# Patient Record
Sex: Female | Born: 1970
Health system: Southern US, Community
[De-identification: ages and names within clinical notes are randomized; demographics above are authoritative.]

## PROBLEM LIST (undated history)

## (undated) DIAGNOSIS — Z86718 Personal history of other venous thrombosis and embolism: Secondary | ICD-10-CM

## (undated) DIAGNOSIS — I10 Essential (primary) hypertension: Secondary | ICD-10-CM

## (undated) DIAGNOSIS — R0602 Shortness of breath: Secondary | ICD-10-CM

## (undated) DIAGNOSIS — J8410 Pulmonary fibrosis, unspecified: Secondary | ICD-10-CM

## (undated) DIAGNOSIS — R609 Edema, unspecified: Secondary | ICD-10-CM

## (undated) DIAGNOSIS — E079 Disorder of thyroid, unspecified: Secondary | ICD-10-CM

## (undated) DIAGNOSIS — C801 Malignant (primary) neoplasm, unspecified: Secondary | ICD-10-CM

## (undated) DIAGNOSIS — M549 Dorsalgia, unspecified: Secondary | ICD-10-CM

## (undated) DIAGNOSIS — M2550 Pain in unspecified joint: Secondary | ICD-10-CM

## (undated) DIAGNOSIS — I729 Aneurysm of unspecified site: Secondary | ICD-10-CM

## (undated) DIAGNOSIS — E119 Type 2 diabetes mellitus without complications: Secondary | ICD-10-CM

## (undated) DIAGNOSIS — E559 Vitamin D deficiency, unspecified: Secondary | ICD-10-CM

## (undated) HISTORY — PX: LUNG BIOPSY: SHX232

## (undated) HISTORY — DX: Vitamin D deficiency, unspecified: E55.9

## (undated) HISTORY — DX: Personal history of other venous thrombosis and embolism: Z86.718

## (undated) HISTORY — DX: Dorsalgia, unspecified: M54.9

## (undated) HISTORY — PX: KNEE SURGERY: SHX244

## (undated) HISTORY — DX: Essential (primary) hypertension: I10

## (undated) HISTORY — DX: Shortness of breath: R06.02

## (undated) HISTORY — PX: ABLATION: SHX5711

## (undated) HISTORY — DX: Edema, unspecified: R60.9

## (undated) HISTORY — DX: Pain in unspecified joint: M25.50

---

## 2000-12-06 ENCOUNTER — Other Ambulatory Visit: Admission: RE | Admit: 2000-12-06 | Discharge: 2000-12-06 | Payer: Self-pay | Admitting: Family Medicine

## 2011-09-10 DIAGNOSIS — J849 Interstitial pulmonary disease, unspecified: Secondary | ICD-10-CM | POA: Insufficient documentation

## 2011-10-01 DIAGNOSIS — R059 Cough, unspecified: Secondary | ICD-10-CM | POA: Insufficient documentation

## 2011-10-01 DIAGNOSIS — R05 Cough: Secondary | ICD-10-CM | POA: Insufficient documentation

## 2011-10-22 DIAGNOSIS — K5792 Diverticulitis of intestine, part unspecified, without perforation or abscess without bleeding: Secondary | ICD-10-CM | POA: Insufficient documentation

## 2011-10-22 DIAGNOSIS — E039 Hypothyroidism, unspecified: Secondary | ICD-10-CM | POA: Insufficient documentation

## 2011-10-22 DIAGNOSIS — Z7952 Long term (current) use of systemic steroids: Secondary | ICD-10-CM | POA: Insufficient documentation

## 2011-10-22 DIAGNOSIS — J189 Pneumonia, unspecified organism: Secondary | ICD-10-CM | POA: Insufficient documentation

## 2011-10-22 DIAGNOSIS — Z8639 Personal history of other endocrine, nutritional and metabolic disease: Secondary | ICD-10-CM | POA: Insufficient documentation

## 2011-11-12 DIAGNOSIS — J84111 Idiopathic interstitial pneumonia, not otherwise specified: Secondary | ICD-10-CM | POA: Insufficient documentation

## 2013-10-30 DIAGNOSIS — J8489 Other specified interstitial pulmonary diseases: Secondary | ICD-10-CM | POA: Insufficient documentation

## 2013-11-15 DIAGNOSIS — R0789 Other chest pain: Secondary | ICD-10-CM | POA: Insufficient documentation

## 2015-04-02 DIAGNOSIS — I10 Essential (primary) hypertension: Secondary | ICD-10-CM | POA: Insufficient documentation

## 2015-04-02 DIAGNOSIS — G4700 Insomnia, unspecified: Secondary | ICD-10-CM | POA: Insufficient documentation

## 2015-04-08 DIAGNOSIS — Z8619 Personal history of other infectious and parasitic diseases: Secondary | ICD-10-CM | POA: Insufficient documentation

## 2015-05-26 DIAGNOSIS — Z9981 Dependence on supplemental oxygen: Secondary | ICD-10-CM | POA: Insufficient documentation

## 2015-12-09 DIAGNOSIS — Z6841 Body Mass Index (BMI) 40.0 and over, adult: Secondary | ICD-10-CM

## 2016-01-06 DIAGNOSIS — I771 Stricture of artery: Secondary | ICD-10-CM | POA: Insufficient documentation

## 2016-01-06 DIAGNOSIS — I774 Celiac artery compression syndrome: Secondary | ICD-10-CM | POA: Insufficient documentation

## 2016-09-20 DIAGNOSIS — G5702 Lesion of sciatic nerve, left lower limb: Secondary | ICD-10-CM | POA: Insufficient documentation

## 2016-10-12 DIAGNOSIS — R000 Tachycardia, unspecified: Secondary | ICD-10-CM | POA: Insufficient documentation

## 2016-10-12 DIAGNOSIS — I272 Pulmonary hypertension, unspecified: Secondary | ICD-10-CM | POA: Insufficient documentation

## 2016-10-12 DIAGNOSIS — R590 Localized enlarged lymph nodes: Secondary | ICD-10-CM | POA: Insufficient documentation

## 2016-10-12 DIAGNOSIS — J479 Bronchiectasis, uncomplicated: Secondary | ICD-10-CM | POA: Insufficient documentation

## 2016-10-12 DIAGNOSIS — M25552 Pain in left hip: Secondary | ICD-10-CM | POA: Insufficient documentation

## 2016-10-12 DIAGNOSIS — I728 Aneurysm of other specified arteries: Secondary | ICD-10-CM | POA: Insufficient documentation

## 2016-12-30 DIAGNOSIS — R600 Localized edema: Secondary | ICD-10-CM | POA: Insufficient documentation

## 2017-04-18 DIAGNOSIS — J9621 Acute and chronic respiratory failure with hypoxia: Secondary | ICD-10-CM | POA: Insufficient documentation

## 2017-04-19 DIAGNOSIS — Z79899 Other long term (current) drug therapy: Secondary | ICD-10-CM | POA: Insufficient documentation

## 2017-05-17 ENCOUNTER — Telehealth (HOSPITAL_COMMUNITY): Payer: Self-pay

## 2017-05-17 NOTE — Telephone Encounter (Signed)
Patients insurance is active and benefits verified through [facility] - No co-pay, deductible amount of $7,000/$7,000 has been met, out of pocket amount of $7,900/$7,900 has been met, 40% co-insurance, and no pre-authorization is required. Reference [id number]

## 2017-05-31 ENCOUNTER — Telehealth (HOSPITAL_COMMUNITY): Payer: Self-pay

## 2017-05-31 NOTE — Telephone Encounter (Signed)
Attempted to call patient to schedule for 10:30 PR class - LMTCB

## 2017-06-02 ENCOUNTER — Telehealth (HOSPITAL_COMMUNITY): Payer: Self-pay

## 2017-06-02 NOTE — Telephone Encounter (Signed)
2nd attempt to call patient to schedule for 10:30am exc class - lm on vm. Sending letter.

## 2017-06-07 ENCOUNTER — Telehealth (HOSPITAL_COMMUNITY): Payer: Self-pay

## 2017-06-07 NOTE — Telephone Encounter (Signed)
3rd attempt to call patient to schedule for 10:30am exc class - lm on vm

## 2017-06-09 ENCOUNTER — Telehealth (HOSPITAL_COMMUNITY): Payer: Self-pay

## 2017-06-09 NOTE — Telephone Encounter (Signed)
Patient returned phone call after receiving letter in the mail. Patient stated the number we have on file was wrong. Updated demographics. Scheduled orientation for Pulmonary Rehab on 07/13/17 at 1:30pm. Patient will attend the 10:30am exc class. Mailed packet.

## 2017-07-13 ENCOUNTER — Ambulatory Visit (HOSPITAL_COMMUNITY): Payer: BLUE CROSS/BLUE SHIELD

## 2017-07-20 DIAGNOSIS — E099 Drug or chemical induced diabetes mellitus without complications: Secondary | ICD-10-CM | POA: Insufficient documentation

## 2017-08-10 ENCOUNTER — Encounter (HOSPITAL_COMMUNITY): Payer: Self-pay

## 2017-08-10 ENCOUNTER — Encounter (HOSPITAL_COMMUNITY)
Admission: RE | Admit: 2017-08-10 | Discharge: 2017-08-10 | Disposition: A | Discharge status: Home / Self Care | Payer: BLUE CROSS/BLUE SHIELD | Source: Ambulatory Visit | Attending: Pulmonary Disease | Admitting: Pulmonary Disease

## 2017-08-10 VITALS — BP 104/55 | HR 103 | Temp 98.1°F | Resp 22 | Ht 62.0 in | Wt 243.4 lb

## 2017-08-10 DIAGNOSIS — Z7901 Long term (current) use of anticoagulants: Secondary | ICD-10-CM | POA: Insufficient documentation

## 2017-08-10 DIAGNOSIS — J849 Interstitial pulmonary disease, unspecified: Secondary | ICD-10-CM | POA: Diagnosis present

## 2017-08-10 DIAGNOSIS — E119 Type 2 diabetes mellitus without complications: Secondary | ICD-10-CM | POA: Insufficient documentation

## 2017-08-10 DIAGNOSIS — Z7952 Long term (current) use of systemic steroids: Secondary | ICD-10-CM | POA: Insufficient documentation

## 2017-08-10 DIAGNOSIS — Z79899 Other long term (current) drug therapy: Secondary | ICD-10-CM | POA: Diagnosis not present

## 2017-08-10 HISTORY — DX: Disorder of thyroid, unspecified: E07.9

## 2017-08-10 HISTORY — DX: Aneurysm of unspecified site: I72.9

## 2017-08-10 HISTORY — DX: Type 2 diabetes mellitus without complications: E11.9

## 2017-08-10 NOTE — Progress Notes (Signed)
Tasha Ballard 47 y.o. female Pulmonary Rehab Orientation Note Patient arrived today in Cardiac and Pulmonary Rehab for orientation to Pulmonary Rehab. She was transported from [employer] via wheel chair accompanied by her son.  Pt has not been able to drive due to overall deconditioning. She does carry portable oxygen. Per pt, she uses oxygen continuously. Pt liter flow varies from 6-10.  Pt placed on 6 lnc since she was sitting and not exerting herself.  Pt maintain oxygen saturation of 100%.  Pt increases her liter flow to 10 LNC on exertion and wears 8 lnc at bedtime.Color good, skin warm and dry. Patient is oriented to time and place. Patient's medical history, psychosocial health, and medications reviewed. Psychosocial assessment reveals pt lives with their spouse. Pt is currently profession owns [employer]. Pt hobbies include reading. Pt reports hher stress level is high. Areas of stress/anxiety include Work.  Pt does exhibit signs of depression. Signs of depression include anxiety and panic and difficulty falling asleep, difficulty maintaining sleep and fatigue. Although pt states that that's just the way she sleeps.PHQ2/9 score 4/17. Pt shows fair  coping skills with positive outlook particularly after her doctors appt on Monday at [facility] where her numbers were moving in the right direction. Drue Dun offered emotional support and reassurance. Will continue to monitor and evaluate progress toward psychosocial goal(s) of gaining the confidence to be able to drive herself to places and not depend on others for transportation. Physical assessment reveals heart rate is normal but tachycardia, breath sounds clear to auscultation, no wheezes, rales, or rhonchi. However crackling lungs sounds are heard in the lower lobes. Grip strength equal, strong. Distal pulses palpable with pedal edema noted bilaterally.  Pt reports this is chronic for her. Patient reports she does take medications as prescribed.  Patient states she follows a presently high protein and low carbohydrate diet diet. Pt has been actively trying to lose weight so that she can get on the transplant list Pt needs to lose 60+ pounds with a goal weight on 175. Patient's weight will be monitored closely. Demonstration and practice of PLB using pulse oximeter. Patient able to return demonstration satisfactorily. Safety and hand hygiene in the exercise area reviewed with patient. Patient voices understanding of the information reviewed. Department expectations discussed with patient and achievable goals were set. The patient shows enthusiasm about attending the program and we look forward to working with this nice patient. The patient is scheduled for a 6 min walk test on 6/20 and to begin exercise on 7/2 at the 10:30 class because she needs to take her son to [facility] next week. 45 minutes was spent on a variety of activities such as assessment of the patient, obtaining baseline data including height, weight, BMI, and grip strength, verifying medical history, allergies, and current medications, and teaching patient strategies for performing tasks with less respiratory effort with emphasis on pursed lip breathing. [place], BSN Cardiac and Training and development officer

## 2017-08-11 ENCOUNTER — Encounter (HOSPITAL_COMMUNITY)
Admission: RE | Admit: 2017-08-11 | Discharge: 2017-08-11 | Disposition: A | Discharge status: Home / Self Care | Payer: BLUE CROSS/BLUE SHIELD | Source: Ambulatory Visit | Attending: Pulmonary Disease | Admitting: Pulmonary Disease

## 2017-08-11 DIAGNOSIS — J849 Interstitial pulmonary disease, unspecified: Secondary | ICD-10-CM | POA: Diagnosis not present

## 2017-08-12 NOTE — Progress Notes (Signed)
Pulmonary Individual Treatment Plan  Patient Details  Name: Tasha Ballard MRN: [id number] Date of Birth: 08/03/1970 Referring Provider:     Pulmonary Rehab Walk Test from 08/11/2017 in [place]  Referring Provider  Dr. Rayann Heman ]      Initial Encounter Date:    Pulmonary Rehab Walk Test from 08/11/2017 in [place]  Date  08/12/17  Referring Provider  Dr. Rayann Heman ]      Visit Diagnosis: ILD (interstitial lung disease) ([place])  Patient's Home Medications on Admission:   Current Outpatient Medications:  .  acyclovir (ZOVIRAX) 400 MG tablet, Take 400 mg by mouth 3 (three) times daily., Disp: , Rfl:  .  apixaban (ELIQUIS) 5 MG TABS tablet, Take 5 mg by mouth 2 (two) times daily., Disp: , Rfl:  .  benzonatate (TESSALON) 100 MG capsule, Take by mouth 3 (three) times daily as needed for cough., Disp: , Rfl:  .  carvedilol (COREG) 6.25 MG tablet, Take 6.25 mg by mouth 2 (two) times daily with a meal., Disp: , Rfl:  .  cholecalciferol (VITAMIN D) 400 units TABS tablet, Take 800 Units by mouth. Pt takes 1,000 units daily, Disp: , Rfl:  .  furosemide (LASIX) 40 MG tablet, Take 40 mg by mouth daily. 40 mg daily but on days of swelling twice a day, Disp: , Rfl:  .  metFORMIN (GLUCOPHAGE) 500 MG tablet, Take by mouth 2 (two) times daily with a meal., Disp: , Rfl:  .  montelukast (SINGULAIR) 10 MG tablet, Take 10 mg by mouth daily after supper., Disp: , Rfl:  .  mycophenolate (CELLCEPT) 500 MG tablet, Take 1,500 mg by mouth 2 (two) times daily. Takes 3 500 mg twice a day, Disp: , Rfl:  .  predniSONE (DELTASONE) 10 MG tablet, Take 10 mg by mouth daily with breakfast., Disp: , Rfl:  .  spironolactone (ALDACTONE) 100 MG tablet, Take 100 mg by mouth daily., Disp: , Rfl:  .  Sulfamethoxazole-Trimethoprim (SEPTRA DS PO), Take by mouth daily. 400-80  Once a day while on prednisone, Disp: , Rfl:  .  thyroid (ARMOUR) 30 MG tablet,  Take 60 mg by mouth daily before breakfast. Takes 60 mg tablet, Disp: , Rfl:   Past Medical History: Past Medical History:  Diagnosis Date  . Aneurysm ([place])   . Diabetes mellitus without complication ([place])   . Thyroid disease     Tobacco Use: Social History   Tobacco Use  Smoking Status Never Smoker  Smokeless Tobacco Never Used    Labs: Recent Review Flowsheet Data    There is no flowsheet data to display.      Capillary Blood Glucose: No results found for: GLUCAP   Pulmonary Assessment Scores:   Pulmonary Function Assessment:   Exercise Target Goals: Date: 08/12/17  Exercise Program Goal: Individual exercise prescription set using results from initial 6 min walk test and THRR while considering  patient's activity barriers and safety.    Exercise Prescription Goal: Initial exercise prescription builds to 30-45 minutes a day of aerobic activity, 2-3 days per week.  Home exercise guidelines will be given to patient during program as part of exercise prescription that the participant will acknowledge.  Activity Barriers & Risk Stratification: Activity Barriers & Cardiac Risk Stratification - 08/10/17 1442      Activity Barriers & Cardiac Risk Stratification   Activity Barriers  Deconditioning;Shortness of Breath       6 Minute Walk:  [place] Name 08/12/17 1047         6 Minute Walk   Phase  Initial     Distance  800 feet     Walk Time  - 5 minutes 40 seconds     # of Rest Breaks  1 1 rest break initiated by EP due to low oxygen saturation     MPH  1.5     METS  2.15     RPE  13     Perceived Dyspnea   2     Symptoms  No     Resting HR  114 bpm     Resting BP  148/81     Resting Oxygen Saturation   97 %     Exercise Oxygen Saturation  during 6 min walk  80 %     Max Ex. HR  146 bpm     Max Ex. BP  136/81       Interval HR   1 Minute HR  118     2 Minute HR  129     3 Minute HR  135     4 Minute HR  135     5 Minute HR  146      6 Minute HR  145     Interval Heart Rate?  Yes       Interval Oxygen   Interval Oxygen?  Yes     Baseline Oxygen Saturation %  97 %     1 Minute Oxygen Saturation %  92 %     1 Minute Liters of Oxygen  8 L     2 Minute Oxygen Saturation %  80 %     2 Minute Liters of Oxygen  8 L     3 Minute Oxygen Saturation %  88 %     3 Minute Liters of Oxygen  10 L     4 Minute Oxygen Saturation %  86 %     4 Minute Liters of Oxygen  10 L     5 Minute Oxygen Saturation %  91 %     5 Minute Liters of Oxygen  15 L     6 Minute Oxygen Saturation %  91 %     6 Minute Liters of Oxygen  15 L        Oxygen Initial Assessment: Oxygen Initial Assessment - 08/12/17 1046      Initial 6 min Walk   Oxygen Used  Continuous;E-Tanks    Liters per minute  15 titrated from 8-15      Program Oxygen Prescription   Program Oxygen Prescription  Continuous;E-Tanks    Liters per minute  15       Oxygen Re-Evaluation:   Oxygen Discharge (Final Oxygen Re-Evaluation):   Initial Exercise Prescription: Initial Exercise Prescription - 08/12/17 1000      Date of Initial Exercise RX and Referring Provider   Date  08/12/17    Referring Provider  Dr. Rayann Heman       Oxygen   Oxygen  Continuous    Liters  15      NuStep   Level  2    SPM  80    Minutes  17    METs  1.5      Arm Ergometer   Level  2    Watts  10    Minutes  17      Track  Laps  8    Minutes  17      Prescription Details   Frequency (times per week)  2    Duration  Progress to 45 minutes of aerobic exercise without signs/symptoms of physical distress      Intensity   THRR 40-80% of Max Heartrate  69-138    Ratings of Perceived Exertion  11-13    Perceived Dyspnea  0-4      Progression   Progression  Continue progressive overload as per policy without signs/symptoms or physical distress.      Resistance Training   Training Prescription  Yes    Weight  blue bands    Reps  10-15       Perform Capillary Blood  Glucose checks as needed.  Exercise Prescription Changes:   Exercise Comments:   Exercise Goals and Review: Exercise Goals    Row Name 08/10/17 1444             Exercise Goals   Increase Physical Activity  Yes       Intervention  Provide advice, education, support and counseling about physical activity/exercise needs.;Develop an individualized exercise prescription for aerobic and resistive training based on initial evaluation findings, risk stratification, comorbidities and participant's personal goals.       Expected Outcomes  Short Term: Attend rehab on a regular basis to increase amount of physical activity.;Long Term: Add in home exercise to make exercise part of routine and to increase amount of physical activity.;Long Term: Exercising regularly at least 3-5 days a week.       Increase Strength and Stamina  Yes       Intervention  Provide advice, education, support and counseling about physical activity/exercise needs.;Develop an individualized exercise prescription for aerobic and resistive training based on initial evaluation findings, risk stratification, comorbidities and participant's personal goals.       Expected Outcomes  Short Term: Increase workloads from initial exercise prescription for resistance, speed, and METs.;Short Term: Perform resistance training exercises routinely during rehab and add in resistance training at home;Long Term: Improve cardiorespiratory fitness, muscular endurance and strength as measured by increased METs and functional capacity (6MWT)       Able to understand and use rate of perceived exertion (RPE) scale  Yes       Intervention  Provide education and explanation on how to use RPE scale       Expected Outcomes  Short Term: Able to use RPE daily in rehab to express subjective intensity level;Long Term:  Able to use RPE to guide intensity level when exercising independently       Able to understand and use Dyspnea scale  Yes       Intervention   Provide education and explanation on how to use Dyspnea scale       Expected Outcomes  Short Term: Able to use Dyspnea scale daily in rehab to express subjective sense of shortness of breath during exertion;Long Term: Able to use Dyspnea scale to guide intensity level when exercising independently       Knowledge and understanding of Target Heart Rate Range (THRR)  Yes       Intervention  Provide education and explanation of THRR including how the numbers were predicted and where they are located for reference       Expected Outcomes  Short Term: Able to state/look up THRR;Long Term: Able to use THRR to govern intensity when exercising independently;Short Term: Able to use daily as guideline for intensity  in rehab       Understanding of Exercise Prescription  Yes       Intervention  Provide education, explanation, and written materials on patient's individual exercise prescription       Expected Outcomes  Short Term: Able to explain program exercise prescription;Long Term: Able to explain home exercise prescription to exercise independently          Exercise Goals Re-Evaluation :   Discharge Exercise Prescription (Final Exercise Prescription Changes):   Nutrition:  Target Goals: Understanding of nutrition guidelines, daily intake of sodium <1562m, cholesterol <2026m calories 30% from fat and 7% or less from saturated fats, daily to have 5 or more servings of fruits and vegetables.  Biometrics:    Nutrition Therapy Plan and Nutrition Goals:   Nutrition Assessments:   Nutrition Goals Re-Evaluation:   Nutrition Goals Discharge (Final Nutrition Goals Re-Evaluation):   Psychosocial: Target Goals: Acknowledge presence or absence of significant depression and/or stress, maximize coping skills, provide positive support system. Participant is able to verbalize types and ability to use techniques and skills needed for reducing stress and depression.  Initial Review & Psychosocial  Screening: Initial Psych Review & Screening - 08/10/17 1439      Initial Review   Current issues with  Current Stress Concerns    Source of Stress Concerns  Transportation;Occupation      Family Dynamics   [place]      Barriers   Psychosocial barriers to participate in program  The patient should benefit from training in stress management and relaxation.      Screening Interventions   Interventions  Encouraged to exercise    Expected Outcomes  Long Term goal: The participant improves quality of Life and PHQ9 Scores as seen by post scores and/or verbalization of changes;Long Term Goal: Stressors or current issues are controlled or eliminated.       Quality of Life Scores:  Scores of 19 and below usually indicate a poorer quality of life in these areas.  A difference of  2-3 points is a clinically meaningful difference.  A difference of 2-3 points in the total score of the Quality of Life Index has been associated with significant improvement in overall quality of life, self-image, physical symptoms, and general health in studies assessing change in quality of life.   PHQ-9: Recent Review Flowsheet Data    [facility]   Decreased Interest 3   Down, Depressed, Hopeless 1   PHQ - 2 Score 4   Altered sleeping 3   Tired, decreased energy 3   Change in appetite 3   Feeling bad or failure about yourself  3   Trouble concentrating 1   Moving slowly or fidgety/restless 0   Suicidal thoughts 0   PHQ-9 Score 17   Difficult doing work/chores Very difficult     Interpretation of Total Score  Total Score Depression Severity:  1-4 = Minimal depression, 5-9 = Mild depression, 10-14 = Moderate depression, 15-19 = Moderately severe depression, 20-27 = Severe depression   Psychosocial Evaluation and Intervention: Psychosocial Evaluation - 08/10/17 1440      Psychosocial Evaluation & Interventions   Interventions  Encouraged to exercise with the  program and follow exercise prescription;Relaxation education;Stress management education    Expected Outcomes  Continue to display hopeful and positive outlook on life    Continue Psychosocial Services   Follow up required by staff       Psychosocial Re-Evaluation:  Psychosocial Discharge (Final Psychosocial Re-Evaluation):   Education: Education Goals: Education classes will be provided on a weekly basis, covering required topics. Participant will state understanding/return demonstration of topics presented.  Learning Barriers/Preferences: Learning Barriers/Preferences - 08/10/17 1441      Learning Barriers/Preferences   Learning Barriers  Sight    Learning Preferences  Computer/Internet;Group Instruction;Individual Instruction;Written Material       Education Topics: Risk Factor Reduction:  -Group instruction that is supported by a PowerPoint presentation. Instructor discusses the definition of a risk factor, different risk factors for pulmonary disease, and how the heart and lungs work together.     Nutrition for Pulmonary Patient:  -Group instruction provided by PowerPoint slides, verbal discussion, and written materials to support subject matter. The instructor gives an explanation and review of healthy diet recommendations, which includes a discussion on weight management, recommendations for fruit and vegetable consumption, as well as protein, fluid, caffeine, fiber, sodium, sugar, and alcohol. Tips for eating when patients are short of breath are discussed.   Pursed Lip Breathing:  -Group instruction that is supported by demonstration and informational handouts. Instructor discusses the benefits of pursed lip and diaphragmatic breathing and detailed demonstration on how to preform both.     Oxygen Safety:  -Group instruction provided by PowerPoint, verbal discussion, and written material to support subject matter. There is an overview of "What is Oxygen" and "Why do we  need it".  Instructor also reviews how to create a safe environment for oxygen use, the importance of using oxygen as prescribed, and the risks of noncompliance. There is a brief discussion on traveling with oxygen and resources the patient may utilize.   Oxygen Equipment:  -Group instruction provided by [facility] Staff utilizing handouts, written materials, and equipment demonstrations.   Signs and Symptoms:  -Group instruction provided by written material and verbal discussion to support subject matter. Warning signs and symptoms of infection, stroke, and heart attack are reviewed and when to call the physician/911 reinforced. Tips for preventing the spread of infection discussed.   Advanced Directives:  -Group instruction provided by verbal instruction and written material to support subject matter. Instructor reviews Advanced Directive laws and proper instruction for filling out document.   Pulmonary Video:  -Group video education that reviews the importance of medication and oxygen compliance, exercise, good nutrition, pulmonary hygiene, and pursed lip and diaphragmatic breathing for the pulmonary patient.   Exercise for the Pulmonary Patient:  -Group instruction that is supported by a PowerPoint presentation. Instructor discusses benefits of exercise, core components of exercise, frequency, duration, and intensity of an exercise routine, importance of utilizing pulse oximetry during exercise, safety while exercising, and options of places to exercise outside of rehab.     Pulmonary Medications:  -Verbally interactive group education provided by instructor with focus on inhaled medications and proper administration.   Anatomy and Physiology of the Respiratory System and Intimacy:  -Group instruction provided by PowerPoint, verbal discussion, and written material to support subject matter. Instructor reviews respiratory cycle and anatomical components of the respiratory system and  their functions. Instructor also reviews differences in obstructive and restrictive respiratory diseases with examples of each. Intimacy, Sex, and Sexuality differences are reviewed with a discussion on how relationships can change when diagnosed with pulmonary disease. Common sexual concerns are reviewed.   MD DAY -A group question and answer session with a medical doctor that allows participants to ask questions that relate to their pulmonary disease state.   OTHER EDUCATION -Group or individual  verbal, written, or video instructions that support the educational goals of the pulmonary rehab program.   Holiday Eating Survival Tips:  -Group instruction provided by PowerPoint slides, verbal discussion, and written materials to support subject matter. The instructor gives patients tips, tricks, and techniques to help them not only survive but enjoy the holidays despite the onslaught of food that accompanies the holidays.   Knowledge Questionnaire Score:   Core Components/Risk Factors/Patient Goals at Admission: Personal Goals and Risk Factors at Admission - 08/10/17 1433      Core Components/Risk Factors/Patient Goals on Admission    Weight Management  Obesity;Weight Loss    Intervention  Weight Management: Develop a combined nutrition and exercise program designed to reach desired caloric intake, while maintaining appropriate intake of nutrient and fiber, sodium and fats, and appropriate energy expenditure required for the weight goal.;Weight Management: Provide education and appropriate resources to help participant work on and attain dietary goals.;Weight Management/Obesity: Establish reasonable short term and long term weight goals.    Admit Weight  243 lb 6.2 oz (110.4 kg)    Goal Weight: Short Term  200 lb (90.7 kg)    Goal Weight: Long Term  175 lb (79.4 kg)    Expected Outcomes  Short Term: Continue to assess and modify interventions until short term weight is achieved;Long Term:  Adherence to nutrition and physical activity/exercise program aimed toward attainment of established weight goal;Weight Loss: Understanding of general recommendations for a balanced deficit meal plan, which promotes 1-2 lb weight loss per week and includes a negative energy balance of [phone] kcal/d;Understanding recommendations for meals to include 15-35% energy as protein, 25-35% energy from fat, 35-60% energy from carbohydrates, less than 21m of dietary cholesterol, 20-35 gm of total fiber daily;Understanding of distribution of calorie intake throughout the day with the consumption of 4-5 meals/snacks    Improve shortness of breath with ADL's  Yes    Intervention  Provide education, individualized exercise plan and daily activity instruction to help decrease symptoms of SOB with activities of daily living.    Expected Outcomes  Short Term: Improve cardiorespiratory fitness to achieve a reduction of symptoms when performing ADLs;Long Term: Be able to perform more ADLs without symptoms or delay the onset of symptoms    Diabetes  Yes    Intervention  Provide education about proper nutrition, including hydration, and aerobic/resistive exercise prescription along with prescribed medications to achieve blood glucose in normal ranges: Fasting glucose 65-99 mg/dL;Provide education about signs/symptoms and action to take for hypo/hyperglycemia.    Expected Outcomes  Short Term: Participant verbalizes understanding of the signs/symptoms and immediate care of hyper/hypoglycemia, proper foot care and importance of medication, aerobic/resistive exercise and nutrition plan for blood glucose control.;Long Term: Attainment of HbA1C < 7%.    Stress  Yes    Intervention  Offer individual and/or small group education and counseling on adjustment to heart disease, stress management and health-related lifestyle change. Teach and support self-help strategies.;Refer participants experiencing significant psychosocial distress  to appropriate mental health specialists for further evaluation and treatment. When possible, include family members and significant others in education/counseling sessions.    Expected Outcomes  Short Term: Participant demonstrates changes in health-related behavior, relaxation and other stress management skills, ability to obtain effective social support, and compliance with psychotropic medications if prescribed.;Long Term: Emotional wellbeing is indicated by absence of clinically significant psychosocial distress or social isolation.       Core Components/Risk Factors/Patient Goals Review:    Core Components/Risk Factors/Patient Goals  at Discharge (Final Review):    ITP Comments: ITP Comments    Row Name 08/10/17 1359           ITP Comments  Dr. Jennet Maduro, Medical Director          Comments:

## 2017-08-23 ENCOUNTER — Encounter (HOSPITAL_COMMUNITY)
Admission: RE | Admit: 2017-08-23 | Discharge: 2017-08-23 | Disposition: A | Discharge status: Home / Self Care | Payer: BLUE CROSS/BLUE SHIELD | Source: Ambulatory Visit | Attending: Pulmonary Disease | Admitting: Pulmonary Disease

## 2017-08-23 DIAGNOSIS — Z7901 Long term (current) use of anticoagulants: Secondary | ICD-10-CM | POA: Insufficient documentation

## 2017-08-23 DIAGNOSIS — Z79899 Other long term (current) drug therapy: Secondary | ICD-10-CM | POA: Insufficient documentation

## 2017-08-23 DIAGNOSIS — Z7952 Long term (current) use of systemic steroids: Secondary | ICD-10-CM | POA: Insufficient documentation

## 2017-08-23 DIAGNOSIS — E119 Type 2 diabetes mellitus without complications: Secondary | ICD-10-CM | POA: Insufficient documentation

## 2017-08-23 DIAGNOSIS — J849 Interstitial pulmonary disease, unspecified: Secondary | ICD-10-CM | POA: Insufficient documentation

## 2017-08-23 NOTE — Progress Notes (Signed)
Incomplete Session Note  Patient Details  Name: Tasha Ballard MRN: [id number] Date of Birth: [birth year] Referring Provider:     Pulmonary Rehab Walk Test from 08/11/2017 in [place]  Referring Provider  Dr. Rayann Heman ]      Tasha Ballard did not complete her rehab session.  Pt arrived for exercise and complained of having nausea and vomiting for the last week while [place] was at the beach with [place]. Pt symptoms started with vomiting and nausea.  Pt feels like it could be a medication side effect.  However pt has not started any new medications.  Pt unable to keep any solids down today. Pt had some soup yesterday and feels weak.  Pt advised not to exercise today and reiterated our sick rule of symptom free for 48 hours.  Pt and mom verbalized understanding.  Pt will return on next Tuesday due to department closure for the holiday. Cherre Huger, BSN Cardiac and Training and development officer

## 2017-08-23 NOTE — Progress Notes (Signed)
Incomplete Session Note  Patient Details  Name: Tasha Ballard MRN: [id number] Date of Birth: Oct 28, 1970 Referring Provider:     Pulmonary Rehab Walk Test from 08/11/2017 in [place]  Referring Provider  Dr. Rayann Heman ]      DEWANNA HURSTON did not complete her rehab session.  She presented to class with vomiting in the last 24 hours and was sent home without exercising.

## 2017-08-30 ENCOUNTER — Encounter (HOSPITAL_COMMUNITY)
Admission: RE | Admit: 2017-08-30 | Discharge: 2017-08-30 | Disposition: A | Discharge status: Home / Self Care | Payer: BLUE CROSS/BLUE SHIELD | Source: Ambulatory Visit | Attending: Pulmonary Disease | Admitting: Pulmonary Disease

## 2017-08-30 VITALS — Wt 236.3 lb

## 2017-08-30 DIAGNOSIS — J849 Interstitial pulmonary disease, unspecified: Secondary | ICD-10-CM | POA: Diagnosis not present

## 2017-08-30 DIAGNOSIS — Z7952 Long term (current) use of systemic steroids: Secondary | ICD-10-CM | POA: Diagnosis not present

## 2017-08-30 DIAGNOSIS — Z7901 Long term (current) use of anticoagulants: Secondary | ICD-10-CM | POA: Diagnosis not present

## 2017-08-30 DIAGNOSIS — E119 Type 2 diabetes mellitus without complications: Secondary | ICD-10-CM | POA: Diagnosis not present

## 2017-08-30 DIAGNOSIS — Z79899 Other long term (current) drug therapy: Secondary | ICD-10-CM | POA: Diagnosis not present

## 2017-08-30 NOTE — Progress Notes (Signed)
Daily Session Note  Patient Details  Name: Tasha Ballard MRN: [id number] Date of Birth: [birth year] Referring Provider:     Pulmonary Rehab Walk Test from 08/11/2017 in [place]  Referring Provider  Dr. Rayann Heman ]      Encounter Date: 08/30/2017  Check In: Session Check In - 08/30/17 1030      Check-In   Location  [facility]    Staff Present  Rosebud Poles, RN, BSN;Carlette Wilber Oliphant, RN, BSN;Lisa Ysidro Evert, Felipe Drone, RN, [facility]    Supervising physician immediately available to respond to emergencies  [facility] immediately available    Physician(s)  Dr. Verlon Au    Medication changes reported      No    Fall or balance concerns reported     No    Tobacco Cessation  No Change    Warm-up and Cool-down  Performed as group-led instruction    Resistance Training Performed  Yes    VAD Patient?  No    PAD/SET Patient?  No      Pain Assessment   Currently in Pain?  No/denies    Multiple Pain Sites  No       Capillary Blood Glucose: No results found for this or any previous visit (from the past 24 hour(s)). POCT Glucose - 08/30/17 1246      POCT Blood Glucose   Pre-Exercise  137 mg/dL      Exercise Prescription Changes - 08/30/17 1200      Response to Exercise   Blood Pressure (Admit)  118/70    Blood Pressure (Exercise)  124/70    Blood Pressure (Exit)  118/76    Heart Rate (Admit)  102 bpm    Heart Rate (Exercise)  118 bpm    Heart Rate (Exit)  108 bpm    Oxygen Saturation (Admit)  98 %    Oxygen Saturation (Exercise)  95 %    Oxygen Saturation (Exit)  99 %    Rating of Perceived Exertion (Exercise)  13    Perceived Dyspnea (Exercise)  1    Duration  Progress to 45 minutes of aerobic exercise without signs/symptoms of physical distress    Intensity  -- 40-80% HRR      Progression   Progression  Continue to progress workloads to maintain intensity without signs/symptoms of physical distress.      Resistance Training   Training Prescription  Yes    Weight  blue bands    Reps  10-15    Time  10 Minutes      Interval Training   Interval Training  No      Oxygen   Oxygen  Continuous    Liters  15      NuStep   Level  2    SPM  80    Minutes  17      Arm Ergometer   Level  2    Watts  10    Minutes  17      Track   Laps  8    Minutes  17       Social History   Tobacco Use  Smoking Status Never Smoker  Smokeless Tobacco Never Used    Goals Met:  Exercise tolerated well Strength training completed today  Goals Unmet:  Not Applicable  Comments: Service time is from1030  to [place]    Dr. Rush Farmer is Medical Director for Pulmonary Rehab at [facility].

## 2017-09-01 ENCOUNTER — Encounter (HOSPITAL_COMMUNITY)
Admission: RE | Admit: 2017-09-01 | Discharge: 2017-09-01 | Disposition: A | Discharge status: Home / Self Care | Payer: BLUE CROSS/BLUE SHIELD | Source: Ambulatory Visit | Attending: Pulmonary Disease | Admitting: Pulmonary Disease

## 2017-09-01 VITALS — Wt 237.7 lb

## 2017-09-01 DIAGNOSIS — J849 Interstitial pulmonary disease, unspecified: Secondary | ICD-10-CM

## 2017-09-01 LAB — GLUCOSE, CAPILLARY: Glucose-Capillary: 162 mg/dL — ABNORMAL HIGH (ref 70–99)

## 2017-09-01 NOTE — Progress Notes (Signed)
Daily Session Note  Patient Details  Name: ADISYN RUSCITTI MRN: [id number] Date of Birth: [birth year] Referring Provider:     Pulmonary Rehab Walk Test from 08/11/2017 in [place]  Referring Provider  Dr. Rayann Heman ]      Encounter Date: 09/01/2017  Check In: Session Check In - 09/01/17 1030      Check-In   Location  [facility]    Staff Present  Rosebud Poles, RN, BSN;Carlette Wilber Oliphant, RN, BSN;Lisa Ysidro Evert, Felipe Drone, RN, [facility]    Supervising physician immediately available to respond to emergencies  [facility] immediately available    Physician(s)  Dr. Broadus John    Medication changes reported      No    Fall or balance concerns reported     No    Tobacco Cessation  No Change    Warm-up and Cool-down  Performed as group-led instruction    Resistance Training Performed  Yes    VAD Patient?  No    PAD/SET Patient?  No      Pain Assessment   Currently in Pain?  No/denies    Multiple Pain Sites  No       Capillary Blood Glucose: Results for orders placed or performed during the hospital encounter of 09/01/17 (from the past 24 hour(s))  Glucose, capillary     Status: Abnormal   Collection Time: 09/01/17 12:24 PM  Result Value Ref Range   Glucose-Capillary 162 (H) 70 - 99 mg/dL      Social History   Tobacco Use  Smoking Status Never Smoker  Smokeless Tobacco Never Used    Goals Met:  Exercise tolerated well Strength training completed today  Goals Unmet:  Not Applicable  Comments: Service time is from 1030 to 1225    Dr. Rush Farmer is Medical Director for Pulmonary Rehab at [facility].

## 2017-09-06 ENCOUNTER — Encounter (HOSPITAL_COMMUNITY)
Admission: RE | Admit: 2017-09-06 | Discharge: 2017-09-06 | Disposition: A | Discharge status: Home / Self Care | Payer: BLUE CROSS/BLUE SHIELD | Source: Ambulatory Visit | Attending: Pulmonary Disease | Admitting: Pulmonary Disease

## 2017-09-06 VITALS — Wt 239.4 lb

## 2017-09-06 DIAGNOSIS — J849 Interstitial pulmonary disease, unspecified: Secondary | ICD-10-CM

## 2017-09-06 NOTE — Progress Notes (Signed)
Daily Session Note  Patient Details  Name: Tasha Ballard MRN: [id number] Date of Birth: 1970/08/11 Referring Provider:     Pulmonary Rehab Walk Test from 08/11/2017 in [place]  Referring Provider  Dr. Rayann Heman ]      Encounter Date: 09/06/2017  Check In: Session Check In - 09/06/17 1030      Check-In   Location  [facility]    Staff Present  Rosebud Poles, RN, BSN;Carlette Carlton, RN, [facility], MS, ACSM RCEP, Exercise Physiologist;Lisa Ysidro Evert, Felipe Drone, RN, [facility]    Supervising physician immediately available to respond to emergencies  [facility] immediately available    Physician(s)  Dr. Broadus John    Medication changes reported      No    Fall or balance concerns reported     No    Tobacco Cessation  No Change    Warm-up and Cool-down  Performed as group-led instruction    Resistance Training Performed  Yes    VAD Patient?  No    PAD/SET Patient?  No      Pain Assessment   Currently in Pain?  No/denies    Multiple Pain Sites  No       Capillary Blood Glucose: No results found for this or any previous visit (from the past 24 hour(s)).    Social History   Tobacco Use  Smoking Status Never Smoker  Smokeless Tobacco Never Used    Goals Met:  Exercise tolerated well Strength training completed today  Goals Unmet:  Not Applicable  Comments: Service time is from 1030 to 1250    Dr. Rush Farmer is Medical Director for Pulmonary Rehab at [facility].

## 2017-09-08 ENCOUNTER — Encounter (HOSPITAL_COMMUNITY)
Admission: RE | Admit: 2017-09-08 | Discharge: 2017-09-08 | Disposition: A | Discharge status: Home / Self Care | Payer: BLUE CROSS/BLUE SHIELD | Source: Ambulatory Visit | Attending: Pulmonary Disease | Admitting: Pulmonary Disease

## 2017-09-08 VITALS — Wt 239.6 lb

## 2017-09-08 DIAGNOSIS — J849 Interstitial pulmonary disease, unspecified: Secondary | ICD-10-CM

## 2017-09-08 NOTE — Progress Notes (Signed)
Daily Session Note  Patient Details  Name: Tasha Ballard MRN: [id number] Date of Birth: 03/19/1970 Referring Provider:     Pulmonary Rehab Walk Test from 08/11/2017 in [place]  Referring Provider  Dr. Rayann Heman ]      Encounter Date: 09/08/2017  Check In: Session Check In - 09/08/17 1030      Check-In   Location  [facility]    Staff Present  Rosebud Poles, RN, BSN;Carlette Carlton, RN, [facility], MS, ACSM RCEP, Exercise Physiologist;Lisa Ysidro Evert, Felipe Drone, RN, [facility]    Supervising physician immediately available to respond to emergencies  [facility] immediately available    Physician(s)  Dr. Denton Brick    Medication changes reported      No    Fall or balance concerns reported     No    Tobacco Cessation  No Change    Warm-up and Cool-down  Performed as group-led instruction    Resistance Training Performed  Yes    VAD Patient?  No    PAD/SET Patient?  No      Pain Assessment   Currently in Pain?  No/denies    Multiple Pain Sites  No       Capillary Blood Glucose: No results found for this or any previous visit (from the past 24 hour(s)).    Social History   Tobacco Use  Smoking Status Never Smoker  Smokeless Tobacco Never Used    Goals Met:  Exercise tolerated well Strength training completed today  Goals Unmet:  Not Applicable  Comments: Service time is from 1030 to 1210.    Dr. Rush Farmer is Medical Director for Pulmonary Rehab at [facility].

## 2017-09-12 NOTE — Progress Notes (Signed)
Pulmonary Individual Treatment Plan  Patient Details  Name: Tasha Ballard MRN: [id number] Date of Birth: August 26, 1970 Referring Provider:     Pulmonary Rehab Walk Test from 08/11/2017 in [place]  Referring Provider  Dr. Rayann Heman ]      Initial Encounter Date:    Pulmonary Rehab Walk Test from 08/11/2017 in [place]  Date  08/12/17      Visit Diagnosis: ILD (interstitial lung disease) ([place])  Patient's Home Medications on Admission:   Current Outpatient Medications:  .  acyclovir (ZOVIRAX) 400 MG tablet, Take 400 mg by mouth 3 (three) times daily., Disp: , Rfl:  .  apixaban (ELIQUIS) 5 MG TABS tablet, Take 5 mg by mouth 2 (two) times daily., Disp: , Rfl:  .  benzonatate (TESSALON) 100 MG capsule, Take by mouth 3 (three) times daily as needed for cough., Disp: , Rfl:  .  carvedilol (COREG) 6.25 MG tablet, Take 6.25 mg by mouth 2 (two) times daily with a meal., Disp: , Rfl:  .  cholecalciferol (VITAMIN D) 400 units TABS tablet, Take 800 Units by mouth. Pt takes 1,000 units daily, Disp: , Rfl:  .  furosemide (LASIX) 40 MG tablet, Take 40 mg by mouth daily. 40 mg daily but on days of swelling twice a day, Disp: , Rfl:  .  metFORMIN (GLUCOPHAGE) 500 MG tablet, Take by mouth 2 (two) times daily with a meal., Disp: , Rfl:  .  montelukast (SINGULAIR) 10 MG tablet, Take 10 mg by mouth daily after supper., Disp: , Rfl:  .  mycophenolate (CELLCEPT) 500 MG tablet, Take 1,500 mg by mouth 2 (two) times daily. Takes 3 500 mg twice a day, Disp: , Rfl:  .  predniSONE (DELTASONE) 10 MG tablet, Take 10 mg by mouth daily with breakfast., Disp: , Rfl:  .  spironolactone (ALDACTONE) 100 MG tablet, Take 100 mg by mouth daily., Disp: , Rfl:  .  Sulfamethoxazole-Trimethoprim (SEPTRA DS PO), Take by mouth daily. 400-80  Once a day while on prednisone, Disp: , Rfl:  .  thyroid (ARMOUR) 30 MG tablet, Take 60 mg by mouth daily before  breakfast. Takes 60 mg tablet, Disp: , Rfl:   Past Medical History: Past Medical History:  Diagnosis Date  . Aneurysm ([place])   . Diabetes mellitus without complication ([place])   . Thyroid disease     Tobacco Use: Social History   Tobacco Use  Smoking Status Never Smoker  Smokeless Tobacco Never Used    Labs: Recent Review Flowsheet Data    There is no flowsheet data to display.      Capillary Blood Glucose: Lab Results  Component Value Date   GLUCAP 162 (H) 09/01/2017   POCT Glucose    Row Name 08/30/17 1246 09/13/17 1255           POCT Blood Glucose   Pre-Exercise  137 mg/dL  191 mg/dL      Post-Exercise  -  219 mg/dL         Pulmonary Assessment Scores: Pulmonary Assessment Scores    Row Name 08/12/17 1055         ADL UCSD   ADL Phase  Entry       mMRC Score   mMRC Score  3        Pulmonary Function Assessment:   Exercise Target Goals:    Exercise Program Goal: Individual exercise prescription set using results from initial 6 min walk test  and THRR while considering  patient's activity barriers and safety.    Exercise Prescription Goal: Initial exercise prescription builds to 30-45 minutes a day of aerobic activity, 2-3 days per week.  Home exercise guidelines will be given to patient during program as part of exercise prescription that the participant will acknowledge.  Activity Barriers & Risk Stratification: Activity Barriers & Cardiac Risk Stratification - 08/10/17 1442      Activity Barriers & Cardiac Risk Stratification   Activity Barriers  Deconditioning;Shortness of Breath       6 Minute Walk: 6 Minute Walk    Row Name 08/12/17 1047         6 Minute Walk   Phase  Initial     Distance  800 feet     Walk Time  - 5 minutes 40 seconds     # of Rest Breaks  1 1 rest break initiated by EP due to low oxygen saturation     MPH  1.5     METS  2.15     RPE  13     Perceived Dyspnea   2     Symptoms  No     Resting HR  114 bpm      Resting BP  148/81     Resting Oxygen Saturation   97 %     Exercise Oxygen Saturation  during 6 min walk  80 %     Max Ex. HR  146 bpm     Max Ex. BP  136/81       Interval HR   1 Minute HR  118     2 Minute HR  129     3 Minute HR  135     4 Minute HR  135     5 Minute HR  146     6 Minute HR  145     Interval Heart Rate?  Yes       Interval Oxygen   Interval Oxygen?  Yes     Baseline Oxygen Saturation %  97 %     1 Minute Oxygen Saturation %  92 %     1 Minute Liters of Oxygen  8 L     2 Minute Oxygen Saturation %  80 %     2 Minute Liters of Oxygen  8 L     3 Minute Oxygen Saturation %  88 %     3 Minute Liters of Oxygen  10 L     4 Minute Oxygen Saturation %  86 %     4 Minute Liters of Oxygen  10 L     5 Minute Oxygen Saturation %  91 %     5 Minute Liters of Oxygen  15 L     6 Minute Oxygen Saturation %  91 %     6 Minute Liters of Oxygen  15 L        Oxygen Initial Assessment: Oxygen Initial Assessment - 08/12/17 1046      Initial 6 min Walk   Oxygen Used  Continuous;E-Tanks    Liters per minute  15 titrated from 8-15      Program Oxygen Prescription   Program Oxygen Prescription  Continuous;E-Tanks    Liters per minute  15       Oxygen Re-Evaluation: Oxygen Re-Evaluation    Row Name 08/19/17 1045 09/12/17 0912           Program Oxygen Prescription  Program Oxygen Prescription  Continuous;E-Tanks  Continuous;E-Tanks      Liters per minute  15  15      Comments  Pt wears 6 liters at rest (nasal cannula) and 10 liters on exertion  Pt wears 6 liters at rest (nasal cannula) and 10 liters on exertion        Home Oxygen   Home Oxygen Device  Portable Concentrator;Home Concentrator  Portable Concentrator;Home Concentrator      Sleep Oxygen Prescription  Continuous  Continuous      Liters per minute  6  6      Home Exercise Oxygen Prescription  Continuous  Continuous      Liters per minute  10  10      Home at Rest Exercise Oxygen Prescription   Continuous  Continuous      Liters per minute  10  10      Compliance with Home Oxygen Use  Yes  Yes        Goals/Expected Outcomes   Short Term Goals  To learn and exhibit compliance with exercise, home and travel O2 prescription;To learn and understand importance of monitoring SPO2 with pulse oximeter and demonstrate accurate use of the pulse oximeter.;To learn and understand importance of maintaining oxygen saturations>88%;To learn and demonstrate proper pursed lip breathing techniques or other breathing techniques.;To learn and demonstrate proper use of respiratory medications  To learn and exhibit compliance with exercise, home and travel O2 prescription;To learn and understand importance of monitoring SPO2 with pulse oximeter and demonstrate accurate use of the pulse oximeter.;To learn and understand importance of maintaining oxygen saturations>88%;To learn and demonstrate proper pursed lip breathing techniques or other breathing techniques.;To learn and demonstrate proper use of respiratory medications      Long  Term Goals  Exhibits compliance with exercise, home and travel O2 prescription;Verbalizes importance of monitoring SPO2 with pulse oximeter and return demonstration;Maintenance of O2 saturations>88%;Exhibits proper breathing techniques, such as pursed lip breathing or other method taught during program session;Compliance with respiratory medication;Demonstrates proper use of MDI's  Exhibits compliance with exercise, home and travel O2 prescription;Verbalizes importance of monitoring SPO2 with pulse oximeter and return demonstration;Maintenance of O2 saturations>88%;Exhibits proper breathing techniques, such as pursed lip breathing or other method taught during program session;Compliance with respiratory medication;Demonstrates proper use of MDI's         Oxygen Discharge (Final Oxygen Re-Evaluation): Oxygen Re-Evaluation - 09/12/17 0912      Program Oxygen Prescription   Program  Oxygen Prescription  Continuous;E-Tanks    Liters per minute  15    Comments  Pt wears 6 liters at rest (nasal cannula) and 10 liters on exertion      Home Oxygen   Home Oxygen Device  Portable Concentrator;Home Concentrator    Sleep Oxygen Prescription  Continuous    Liters per minute  6    Home Exercise Oxygen Prescription  Continuous    Liters per minute  10    Home at Rest Exercise Oxygen Prescription  Continuous    Liters per minute  10    Compliance with Home Oxygen Use  Yes      Goals/Expected Outcomes   Short Term Goals  To learn and exhibit compliance with exercise, home and travel O2 prescription;To learn and understand importance of monitoring SPO2 with pulse oximeter and demonstrate accurate use of the pulse oximeter.;To learn and understand importance of maintaining oxygen saturations>88%;To learn and demonstrate proper pursed lip breathing techniques or other breathing techniques.;To learn and  demonstrate proper use of respiratory medications    Long  Term Goals  Exhibits compliance with exercise, home and travel O2 prescription;Verbalizes importance of monitoring SPO2 with pulse oximeter and return demonstration;Maintenance of O2 saturations>88%;Exhibits proper breathing techniques, such as pursed lip breathing or other method taught during program session;Compliance with respiratory medication;Demonstrates proper use of MDI's       Initial Exercise Prescription: Initial Exercise Prescription - 08/12/17 1000      Date of Initial Exercise RX and Referring Provider   Date  08/12/17    Referring Provider  Dr. Rayann Heman       Oxygen   Oxygen  Continuous    Liters  15      NuStep   Level  2    SPM  80    Minutes  17    METs  1.5      Arm Ergometer   Level  2    Watts  10    Minutes  17      Track   Laps  8    Minutes  17      Prescription Details   Frequency (times per week)  2    Duration  Progress to 45 minutes of aerobic exercise without signs/symptoms  of physical distress      Intensity   THRR 40-80% of Max Heartrate  69-138    Ratings of Perceived Exertion  11-13    Perceived Dyspnea  0-4      Progression   Progression  Continue progressive overload as per policy without signs/symptoms or physical distress.      Resistance Training   Training Prescription  Yes    Weight  blue bands    Reps  10-15       Perform Capillary Blood Glucose checks as needed.  Exercise Prescription Changes:  Exercise Prescription Changes    Row Name 08/30/17 1200 09/13/17 1200           Response to Exercise   Blood Pressure (Admit)  118/70  112/70      Blood Pressure (Exercise)  124/70  108/78      Blood Pressure (Exit)  118/76  110/70      Heart Rate (Admit)  102 bpm  104 bpm      Heart Rate (Exercise)  118 bpm  114 bpm      Heart Rate (Exit)  108 bpm  105 bpm      Oxygen Saturation (Admit)  98 %  97 %      Oxygen Saturation (Exercise)  95 %  94 %      Oxygen Saturation (Exit)  99 %  99 %      Rating of Perceived Exertion (Exercise)  13  13      Perceived Dyspnea (Exercise)  1  3      Duration  Progress to 45 minutes of aerobic exercise without signs/symptoms of physical distress  Progress to 45 minutes of aerobic exercise without signs/symptoms of physical distress      Intensity  - 40-80% HRR  THRR unchanged        Progression   Progression  Continue to progress workloads to maintain intensity without signs/symptoms of physical distress.  Continue to progress workloads to maintain intensity without signs/symptoms of physical distress.        Resistance Training   Training Prescription  Yes  Yes      Weight  blue bands  blue bands  Reps  10-15  10-15      Time  10 Minutes  10 Minutes        Interval Training   Interval Training  No  No        Oxygen   Oxygen  Continuous  Continuous      Liters  15  15        NuStep   Level  2  2      SPM  80  80      Minutes  17  17      METs  -  1.4        Arm Ergometer   Level  2   2      Watts  10  10      Minutes  17  17        Track   Laps  8  10      Minutes  17  17         Exercise Comments:   Exercise Goals and Review:  Exercise Goals    Row Name 08/10/17 1444             Exercise Goals   Increase Physical Activity  Yes       Intervention  Provide advice, education, support and counseling about physical activity/exercise needs.;Develop an individualized exercise prescription for aerobic and resistive training based on initial evaluation findings, risk stratification, comorbidities and participant's personal goals.       Expected Outcomes  Short Term: Attend rehab on a regular basis to increase amount of physical activity.;Long Term: Add in home exercise to make exercise part of routine and to increase amount of physical activity.;Long Term: Exercising regularly at least 3-5 days a week.       Increase Strength and Stamina  Yes       Intervention  Provide advice, education, support and counseling about physical activity/exercise needs.;Develop an individualized exercise prescription for aerobic and resistive training based on initial evaluation findings, risk stratification, comorbidities and participant's personal goals.       Expected Outcomes  Short Term: Increase workloads from initial exercise prescription for resistance, speed, and METs.;Short Term: Perform resistance training exercises routinely during rehab and add in resistance training at home;Long Term: Improve cardiorespiratory fitness, muscular endurance and strength as measured by increased METs and functional capacity (6MWT)       Able to understand and use rate of perceived exertion (RPE) scale  Yes       Intervention  Provide education and explanation on how to use RPE scale       Expected Outcomes  Short Term: Able to use RPE daily in rehab to express subjective intensity level;Long Term:  Able to use RPE to guide intensity level when exercising independently       Able to understand and  use Dyspnea scale  Yes       Intervention  Provide education and explanation on how to use Dyspnea scale       Expected Outcomes  Short Term: Able to use Dyspnea scale daily in rehab to express subjective sense of shortness of breath during exertion;Long Term: Able to use Dyspnea scale to guide intensity level when exercising independently       Knowledge and understanding of Target Heart Rate Range (THRR)  Yes       Intervention  Provide education and explanation of THRR including how the numbers were predicted and where they are located for reference  Expected Outcomes  Short Term: Able to state/look up THRR;Long Term: Able to use THRR to govern intensity when exercising independently;Short Term: Able to use daily as guideline for intensity in rehab       Understanding of Exercise Prescription  Yes       Intervention  Provide education, explanation, and written materials on patient's individual exercise prescription       Expected Outcomes  Short Term: Able to explain program exercise prescription;Long Term: Able to explain home exercise prescription to exercise independently          Exercise Goals Re-Evaluation : Exercise Goals Re-Evaluation    [place] Name 08/19/17 1046 09/12/17 0913           Exercise Goal Re-Evaluation   Exercise Goals Review  Increase Physical Activity;Able to understand and use rate of perceived exertion (RPE) scale;Knowledge and understanding of Target Heart Rate Range (THRR);Understanding of Exercise Prescription;Increase Strength and Stamina;Able to understand and use Dyspnea scale;Able to check pulse independently  Increase Physical Activity;Able to understand and use rate of perceived exertion (RPE) scale;Knowledge and understanding of Target Heart Rate Range (THRR);Understanding of Exercise Prescription;Increase Strength and Stamina;Able to understand and use Dyspnea scale      Comments  Patient's first day of exercise will be 08/23/17  Patient has only attended 4  rehab sessions. Will cont. to monitor and progress as able.       Expected Outcomes  Through exercise at rehab and at home, the patient will decrease shortness of breath with daily activities and feel confident in carrying out an exercise regime at home.   Through exercise at rehab and at home, the patient will decrease shortness of breath with daily activities and feel confident in carrying out an exercise regime at home.          Discharge Exercise Prescription (Final Exercise Prescription Changes): Exercise Prescription Changes - 09/13/17 1200      Response to Exercise   Blood Pressure (Admit)  112/70    Blood Pressure (Exercise)  108/78    Blood Pressure (Exit)  110/70    Heart Rate (Admit)  104 bpm    Heart Rate (Exercise)  114 bpm    Heart Rate (Exit)  105 bpm    Oxygen Saturation (Admit)  97 %    Oxygen Saturation (Exercise)  94 %    Oxygen Saturation (Exit)  99 %    Rating of Perceived Exertion (Exercise)  13    Perceived Dyspnea (Exercise)  3    Duration  Progress to 45 minutes of aerobic exercise without signs/symptoms of physical distress    Intensity  THRR unchanged      Progression   Progression  Continue to progress workloads to maintain intensity without signs/symptoms of physical distress.      Resistance Training   Training Prescription  Yes    Weight  blue bands    Reps  10-15    Time  10 Minutes      Interval Training   Interval Training  No      Oxygen   Oxygen  Continuous    Liters  15      NuStep   Level  2    SPM  80    Minutes  17    METs  1.4      Arm Ergometer   Level  2    Watts  10    Minutes  17      Track   Laps  10    Minutes  17       Nutrition:  Target Goals: Understanding of nutrition guidelines, daily intake of sodium <1593m, cholesterol <2048m calories 30% from fat and 7% or less from saturated fats, daily to have 5 or more servings of fruits and vegetables.  Biometrics:    Nutrition Therapy Plan and Nutrition  Goals:   Nutrition Assessments:   Nutrition Goals Re-Evaluation:   Nutrition Goals Discharge (Final Nutrition Goals Re-Evaluation):   Psychosocial: Target Goals: Acknowledge presence or absence of significant depression and/or stress, maximize coping skills, provide positive support system. Participant is able to verbalize types and ability to use techniques and skills needed for reducing stress and depression.  Initial Review & Psychosocial Screening: Initial Psych Review & Screening - 08/10/17 1439      Initial Review   Current issues with  Current Stress Concerns    Source of Stress Concerns  Transportation;Occupation      Family Dynamics   [place] Yes      Barriers   Psychosocial barriers to participate in program  The patient should benefit from training in stress management and relaxation.      Screening Interventions   Interventions  Encouraged to exercise    Expected Outcomes  Long Term goal: The participant improves quality of Life and PHQ9 Scores as seen by post scores and/or verbalization of changes;Long Term Goal: Stressors or current issues are controlled or eliminated.       Quality of Life Scores:  Scores of 19 and below usually indicate a poorer quality of life in these areas.  A difference of  2-3 points is a clinically meaningful difference.  A difference of 2-3 points in the total score of the Quality of Life Index has been associated with significant improvement in overall quality of life, self-image, physical symptoms, and general health in studies assessing change in quality of life.   PHQ-9: Recent Review Flowsheet Data    [facility] 08[facility]2017   Decreased Interest 3   Down, Depressed, Hopeless 1   PHQ - 2 Score 4   Altered sleeping 3   Tired, decreased energy 3   Change in appetite 3   Feeling bad or failure about yourself  3   Trouble concentrating 1   Moving slowly or fidgety/restless 0   Suicidal thoughts 0    PHQ-9 Score 17   Difficult doing work/chores Very difficult     Interpretation of Total Score  Total Score Depression Severity:  1-4 = Minimal depression, 5-9 = Mild depression, 10-14 = Moderate depression, 15-19 = Moderately severe depression, 20-27 = Severe depression   Psychosocial Evaluation and Intervention: Psychosocial Evaluation - 09/12/17 1345      Psychosocial Evaluation & Interventions   Interventions  Encouraged to exercise with the program and follow exercise prescription;Relaxation education;Stress management education    Comments  Since starting exercise with pulmonary rehab, observed less crying spells due to the realization of where she is with her disease process and overal limitations due to deconditioning.  Pt is less frustrated and even joked that at least I am not crying the whole time!    Expected Outcomes  Continue to display hopeful and positive outlook on life    Continue Psychosocial Services   Follow up required by staff       Psychosocial Re-Evaluation: Psychosocial Re-Evaluation    [facility] 09/12/17 1514             Psychosocial  Re-Evaluation   Current issues with  Current Stress Concerns;Current Anxiety/Panic       Comments  Pt is not driving and does not feel independent.  this is a source of her stress and anxiety.  Pt is uncertain of what her future will look like.         Interventions  Stress management education;Encouraged to attend Pulmonary Rehabilitation for the exercise;Relaxation education         Initial Review   Source of Stress Concerns  Transportation;Occupation          Psychosocial Discharge (Final Psychosocial Re-Evaluation): Psychosocial Re-Evaluation - 09/12/17 1514      Psychosocial Re-Evaluation   Current issues with  Current Stress Concerns;Current Anxiety/Panic    Comments  Pt is not driving and does not feel independent.  this is a source of her stress and anxiety.  Pt is uncertain of what her future will look like.       Interventions  Stress management education;Encouraged to attend Pulmonary Rehabilitation for the exercise;Relaxation education      Initial Review   Source of Stress Concerns  Transportation;Occupation       Education: Education Goals: Education classes will be provided on a weekly basis, covering required topics. Participant will state understanding/return demonstration of topics presented.  Learning Barriers/Preferences: Learning Barriers/Preferences - 08/10/17 1441      Learning Barriers/Preferences   Learning Barriers  Sight    Learning Preferences  Computer/Internet;Group Instruction;Individual Instruction;Written Material       Education Topics: Risk Factor Reduction:  -Group instruction that is supported by a PowerPoint presentation. Instructor discusses the definition of a risk factor, different risk factors for pulmonary disease, and how the heart and lungs work together.     Nutrition for Pulmonary Patient:  -Group instruction provided by PowerPoint slides, verbal discussion, and written materials to support subject matter. The instructor gives an explanation and review of healthy diet recommendations, which includes a discussion on weight management, recommendations for fruit and vegetable consumption, as well as protein, fluid, caffeine, fiber, sodium, sugar, and alcohol. Tips for eating when patients are short of breath are discussed.   Pursed Lip Breathing:  -Group instruction that is supported by demonstration and informational handouts. Instructor discusses the benefits of pursed lip and diaphragmatic breathing and detailed demonstration on how to preform both.     Oxygen Safety:  -Group instruction provided by PowerPoint, verbal discussion, and written material to support subject matter. There is an overview of "What is Oxygen" and "Why do we need it".  Instructor also reviews how to create a safe environment for oxygen use, the importance of using oxygen as  prescribed, and the risks of noncompliance. There is a brief discussion on traveling with oxygen and resources the patient may utilize.   PULMONARY REHAB OTHER RESPIRATORY from 09/08/2017 in [place]  Date  09/08/17  Educator  Cloyde Reams  Instruction Review Code  1- Verbalizes Understanding      Oxygen Equipment:  -Group instruction provided by [employer] utilizing handouts, written materials, and Insurance underwriter.   Signs and Symptoms:  -Group instruction provided by written material and verbal discussion to support subject matter. Warning signs and symptoms of infection, stroke, and heart attack are reviewed and when to call the physician/911 reinforced. Tips for preventing the spread of infection discussed.   PULMONARY REHAB OTHER RESPIRATORY from 09/08/2017 in [place]  Date  09/01/17  Educator  RN  Instruction  Review Code  1- Verbalizes Understanding      Advanced Directives:  -Group instruction provided by verbal instruction and written material to support subject matter. Instructor reviews Advanced Directive laws and proper instruction for filling out document.   Pulmonary Video:  -Group video education that reviews the importance of medication and oxygen compliance, exercise, good nutrition, pulmonary hygiene, and pursed lip and diaphragmatic breathing for the pulmonary patient.   Exercise for the Pulmonary Patient:  -Group instruction that is supported by a PowerPoint presentation. Instructor discusses benefits of exercise, core components of exercise, frequency, duration, and intensity of an exercise routine, importance of utilizing pulse oximetry during exercise, safety while exercising, and options of places to exercise outside of rehab.     Pulmonary Medications:  -Verbally interactive group education provided by instructor with focus on inhaled medications and proper  administration.   Anatomy and Physiology of the Respiratory System and Intimacy:  -Group instruction provided by PowerPoint, verbal discussion, and written material to support subject matter. Instructor reviews respiratory cycle and anatomical components of the respiratory system and their functions. Instructor also reviews differences in obstructive and restrictive respiratory diseases with examples of each. Intimacy, Sex, and Sexuality differences are reviewed with a discussion on how relationships can change when diagnosed with pulmonary disease. Common sexual concerns are reviewed.   MD DAY -A group question and answer session with a medical doctor that allows participants to ask questions that relate to their pulmonary disease state.   OTHER EDUCATION -Group or individual verbal, written, or video instructions that support the educational goals of the pulmonary rehab program.   Holiday Eating Survival Tips:  -Group instruction provided by PowerPoint slides, verbal discussion, and written materials to support subject matter. The instructor gives patients tips, tricks, and techniques to help them not only survive but enjoy the holidays despite the onslaught of food that accompanies the holidays.   Knowledge Questionnaire Score:   Core Components/Risk Factors/Patient Goals at Admission: Personal Goals and Risk Factors at Admission - 08/10/17 1433      Core Components/Risk Factors/Patient Goals on Admission    Weight Management  Obesity;Weight Loss    Intervention  Weight Management: Develop a combined nutrition and exercise program designed to reach desired caloric intake, while maintaining appropriate intake of nutrient and fiber, sodium and fats, and appropriate energy expenditure required for the weight goal.;Weight Management: Provide education and appropriate resources to help participant work on and attain dietary goals.;Weight Management/Obesity: Establish reasonable short term  and long term weight goals.    Admit Weight  243 lb 6.2 oz (110.4 kg)    Goal Weight: Short Term  200 lb (90.7 kg)    Goal Weight: Long Term  175 lb (79.4 kg)    Expected Outcomes  Short Term: Continue to assess and modify interventions until short term weight is achieved;Long Term: Adherence to nutrition and physical activity/exercise program aimed toward attainment of established weight goal;Weight Loss: Understanding of general recommendations for a balanced deficit meal plan, which promotes 1-2 lb weight loss per week and includes a negative energy balance of [phone] kcal/d;Understanding recommendations for meals to include 15-35% energy as protein, 25-35% energy from fat, 35-60% energy from carbohydrates, less than 267m of dietary cholesterol, 20-35 gm of total fiber daily;Understanding of distribution of calorie intake throughout the day with the consumption of 4-5 meals/snacks    Improve shortness of breath with ADL's  Yes    Intervention  Provide education, individualized exercise plan and daily activity instruction to  help decrease symptoms of SOB with activities of daily living.    Expected Outcomes  Short Term: Improve cardiorespiratory fitness to achieve a reduction of symptoms when performing ADLs;Long Term: Be able to perform more ADLs without symptoms or delay the onset of symptoms    Diabetes  Yes    Intervention  Provide education about proper nutrition, including hydration, and aerobic/resistive exercise prescription along with prescribed medications to achieve blood glucose in normal ranges: Fasting glucose 65-99 mg/dL;Provide education about signs/symptoms and action to take for hypo/hyperglycemia.    Expected Outcomes  Short Term: Participant verbalizes understanding of the signs/symptoms and immediate care of hyper/hypoglycemia, proper foot care and importance of medication, aerobic/resistive exercise and nutrition plan for blood glucose control.;Long Term: Attainment of HbA1C < 7%.     Stress  Yes    Intervention  Offer individual and/or small group education and counseling on adjustment to heart disease, stress management and health-related lifestyle change. Teach and support self-help strategies.;Refer participants experiencing significant psychosocial distress to appropriate mental health specialists for further evaluation and treatment. When possible, include family members and significant others in education/counseling sessions.    Expected Outcomes  Short Term: Participant demonstrates changes in health-related behavior, relaxation and other stress management skills, ability to obtain effective social support, and compliance with psychotropic medications if prescribed.;Long Term: Emotional wellbeing is indicated by absence of clinically significant psychosocial distress or social isolation.       Core Components/Risk Factors/Patient Goals Review:  Goals and Risk Factor Review    Row Name 09/12/17 1335 09/14/17 1147           Core Components/Risk Factors/Patient Goals Review   Personal Goals Review  Weight Management/Obesity;Develop more efficient breathing techniques such as purse lipped breathing and diaphragmatic breathing and practicing self-pacing with activity.;Diabetes;Improve shortness of breath with ADL's;Increase knowledge of respiratory medications and ability to use respiratory devices properly.;Stress  -      Review  Pt off to a great start.  Pt has completed 4 exercise sessions since 08/30/17.  Pt weight shows increase from 107.2 kg to 108.7 kg.  Pt has not engaged in home exerice on her days off due to deconditioning and fear of not knowing what she can do safely on her own.  Will plan to have the Exercise Physiologist talk with pt regarding home exercise.  Pt continue to feel stressed with her lung disease and the uncertainity of what is to come.  Pt has attened oxygen safety class and demonstrates a good working knowledge of her pendulant cannula.  Pt  observed having good PLB techniques.   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  Will resolve this as a patient goal.  Pt off to a great start.  Pt has completed 4 exercise sessions since 08/30/17.  Pt weight shows increase from 107.2 kg to 108.7 kg.  Pt has not engaged in home exerice on her days off due to deconditioning and fear of not knowing what she can do safely on her own.  Will plan to have the Exercise Physiologist talk with pt regarding home exercise.  Pt continue to feel stressed with her lung disease and the uncertainity of what is to come.  Pt has attened oxygen safety class and demonstrates a good working knowledge of her pendulant cannula.  Pt observed having good PLB techniques.   Pt workloads show arm crank level 2, nustep level 2 and track averages 8-10 laps. 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  Will resolve this as a patient goal.      Expected Outcomes  Pt will show progress toward weight loss so that she will be able to be eligible for placement for lung transplant.    Stressors will be  eliminated and/or  will report appropriate and healthy coping skills to deal with stressors.  pt will use proper teechnique of diaphragmatic breathing and PLB which will improve her shortness of breath  See Admission Goals/Outcomes         Core Components/Risk Factors/Patient Goals at Discharge (Final Review):  Goals and Risk Factor Review - 09/14/17 1147      Core Components/Risk Factors/Patient Goals Review   Review  Pt off to a great start.  Pt has completed 4 exercise sessions since 08/30/17.  Pt weight shows increase from 107.2 kg to 108.7 kg.  Pt has not engaged in home exerice on her days off due to deconditioning and fear of not knowing what she can do safely on her own.  Will plan to have the Exercise Physiologist talk with pt regarding home exercise.  Pt  continue to feel stressed with her lung disease and the uncertainity of what is to come.  Pt has attened oxygen safety class and demonstrates a good working knowledge of her pendulant cannula.  Pt observed having good PLB techniques.   Pt workloads show arm crank level 2, nustep level 2 and track averages 8-10 laps. 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  Will resolve this as a patient goal.    Expected Outcomes  See Admission Goals/Outcomes       ITP Comments: ITP Comments    Row Name 08/10/17 1359 09/12/17 1453         ITP Comments  Dr. Jennet Maduro, Medical Director  Dr. Jennet Maduro, Medical Director         Comments:  Pt has completed 4 exercise sessions since 7/9. Cherre Huger, BSN Cardiac and Training and development officer

## 2017-09-13 ENCOUNTER — Encounter (HOSPITAL_COMMUNITY)
Admission: RE | Admit: 2017-09-13 | Discharge: 2017-09-13 | Disposition: A | Discharge status: Home / Self Care | Payer: BLUE CROSS/BLUE SHIELD | Source: Ambulatory Visit | Attending: Pulmonary Disease | Admitting: Pulmonary Disease

## 2017-09-13 VITALS — Wt 239.4 lb

## 2017-09-13 DIAGNOSIS — J849 Interstitial pulmonary disease, unspecified: Secondary | ICD-10-CM | POA: Diagnosis not present

## 2017-09-13 NOTE — Progress Notes (Signed)
Daily Session Note  Patient Details  Name: Tasha Ballard MRN: [id number] Date of Birth: [birth year] Referring Provider:     Pulmonary Rehab Walk Test from 08/11/2017 in [place]  Referring Provider  Dr. Rayann Heman ]      Encounter Date: 09/13/2017  Check In: Session Check In - 09/13/17 1030      Check-In   Location  [facility]    Staff Present  Rosebud Poles, RN, BSN;Carlette Carlton, RN, [facility] Di[facility]incenzo, MS, ACSM RCEP, Exercise Physiologist;Lisa Ysidro Evert, Felipe Drone, RN, [facility]    Supervising physician immediately available to respond to emergencies  [facility] immediately available    Physician(s)  Dr. Denton Brick    Medication changes reported      No    Fall or balance concerns reported     No    Tobacco Cessation  No Change    Warm-up and Cool-down  Performed as group-led instruction    Resistance Training Performed  Yes    VAD Patient?  No    PAD/SET Patient?  No      Pain Assessment   Currently in Pain?  No/denies    Multiple Pain Sites  No       Capillary Blood Glucose: No results found for this or any previous visit (from the past 24 hour(s)). POCT Glucose - 09/13/17 1255      POCT Blood Glucose   Pre-Exercise  191 mg/dL    Post-Exercise  219 mg/dL      Exercise Prescription Changes - 09/13/17 1200      Response to Exercise   Blood Pressure (Admit)  112/70    Blood Pressure (Exercise)  108/78    Blood Pressure (Exit)  110/70    Heart Rate (Admit)  104 bpm    Heart Rate (Exercise)  114 bpm    Heart Rate (Exit)  105 bpm    Oxygen Saturation (Admit)  97 %    Oxygen Saturation (Exercise)  94 %    Oxygen Saturation (Exit)  99 %    Rating of Perceived Exertion (Exercise)  13    Perceived Dyspnea (Exercise)  3    Duration  Progress to 45 minutes of aerobic exercise without signs/symptoms of physical distress    Intensity  THRR unchanged      Progression   Progression  Continue to  progress workloads to maintain intensity without signs/symptoms of physical distress.      Resistance Training   Training Prescription  Yes    Weight  blue bands    Reps  10-15    Time  10 Minutes      Interval Training   Interval Training  No      Oxygen   Oxygen  Continuous    Liters  15      NuStep   Level  2    SPM  80    Minutes  17    METs  1.4      Arm Ergometer   Level  2    Watts  10    Minutes  17      Track   Laps  10    Minutes  17       Social History   Tobacco Use  Smoking Status Never Smoker  Smokeless Tobacco Never Used    Goals Met:  Exercise tolerated well Strength training completed today  Goals Unmet:  Not Applicable  Comments: Service time is from  1030 to 1215    Dr. Rush Farmer is Medical Director for Pulmonary Rehab at [facility].

## 2017-09-15 ENCOUNTER — Encounter (HOSPITAL_COMMUNITY): Payer: BLUE CROSS/BLUE SHIELD

## 2017-09-15 ENCOUNTER — Encounter (HOSPITAL_COMMUNITY): Payer: Self-pay

## 2017-09-19 DIAGNOSIS — J3489 Other specified disorders of nose and nasal sinuses: Secondary | ICD-10-CM | POA: Insufficient documentation

## 2017-09-20 ENCOUNTER — Encounter (HOSPITAL_COMMUNITY)
Admission: RE | Admit: 2017-09-20 | Discharge: 2017-09-20 | Disposition: A | Discharge status: Home / Self Care | Payer: BLUE CROSS/BLUE SHIELD | Source: Ambulatory Visit | Attending: Pulmonary Disease | Admitting: Pulmonary Disease

## 2017-09-20 DIAGNOSIS — J849 Interstitial pulmonary disease, unspecified: Secondary | ICD-10-CM

## 2017-09-20 NOTE — Progress Notes (Signed)
Daily Session Note  Patient Details  Name: Tasha Ballard MRN: [id number] Date of Birth: [birth year] Referring Provider:     Pulmonary Rehab Walk Test from 08/11/2017 in [place]  Referring Provider  Dr. Rayann Heman ]      Encounter Date: 09/20/2017  Check In: Session Check In - 09/20/17 1034      Check-In   Supervising physician immediately available to respond to emergencies  [facility] immediately available    Physician(s)  Dr. Rodena Piety    Location  [facility]    Staff Present  Maurice Small, RN, BSN;Molly DiVincenzo, MS, ACSM RCEP, Exercise Physiologist;Caliann Leckrone Ysidro Evert, RN    Medication changes reported      No    Fall or balance concerns reported     No    Tobacco Cessation  No Change    Warm-up and Cool-down  Performed as group-led instruction    Resistance Training Performed  Yes    VAD Patient?  No    PAD/SET Patient?  No      Pain Assessment   Currently in Pain?  No/denies    Multiple Pain Sites  No       Capillary Blood Glucose: No results found for this or any previous visit (from the past 24 hour(s)).    Social History   Tobacco Use  Smoking Status Never Smoker  Smokeless Tobacco Never Used    Goals Met:  Exercise tolerated well No report of cardiac concerns or symptoms Strength training completed today  Goals Unmet:  Not Applicable  Comments: Service time is from 1030 to 1205    Dr. Rush Farmer is Medical Director for Pulmonary Rehab at [facility].

## 2017-09-22 ENCOUNTER — Encounter (HOSPITAL_COMMUNITY)
Admission: RE | Admit: 2017-09-22 | Discharge: 2017-09-22 | Disposition: A | Discharge status: Home / Self Care | Payer: BLUE CROSS/BLUE SHIELD | Source: Ambulatory Visit | Attending: Pulmonary Disease | Admitting: Pulmonary Disease

## 2017-09-22 DIAGNOSIS — Z79899 Other long term (current) drug therapy: Secondary | ICD-10-CM | POA: Diagnosis not present

## 2017-09-22 DIAGNOSIS — Z7952 Long term (current) use of systemic steroids: Secondary | ICD-10-CM | POA: Insufficient documentation

## 2017-09-22 DIAGNOSIS — J849 Interstitial pulmonary disease, unspecified: Secondary | ICD-10-CM | POA: Insufficient documentation

## 2017-09-22 DIAGNOSIS — E119 Type 2 diabetes mellitus without complications: Secondary | ICD-10-CM | POA: Insufficient documentation

## 2017-09-22 DIAGNOSIS — Z7901 Long term (current) use of anticoagulants: Secondary | ICD-10-CM | POA: Insufficient documentation

## 2017-09-22 NOTE — Progress Notes (Signed)
Daily Session Note  Patient Details  Name: Tasha Ballard MRN: [id number] Date of Birth: 11/03/1970 Referring Provider:     Pulmonary Rehab Walk Test from 08/11/2017 in [place]  Referring Provider  Dr. Rayann Heman ]      Encounter Date: 09/22/2017  Check In: Session Check In - 09/22/17 1126      Check-In   Supervising physician immediately available to respond to emergencies  [facility] immediately available    Physician(s)  Dr. Herbert Moors    Location  [facility]    Staff Present  Su Hilt, MS, ACSM RCEP, Exercise Physiologist;Lisa Ysidro Evert, RN;Carlette Carlton, RN, BSN    Medication changes reported      No    Fall or balance concerns reported     No    Tobacco Cessation  No Change    Warm-up and Cool-down  Performed as group-led instruction    Resistance Training Performed  Yes    VAD Patient?  No    PAD/SET Patient?  No      Pain Assessment   Currently in Pain?  No/denies    Multiple Pain Sites  No       Capillary Blood Glucose: No results found for this or any previous visit (from the past 24 hour(s)).    Social History   Tobacco Use  Smoking Status Never Smoker  Smokeless Tobacco Never Used    Goals Met:  Exercise tolerated well No report of cardiac concerns or symptoms Strength training completed today  Goals Unmet:  Not Applicable  Comments: Service time is from 10:30a to 12:30p    Dr. Rush Farmer is Medical Director for Pulmonary Rehab at [facility].

## 2017-09-27 ENCOUNTER — Encounter (HOSPITAL_COMMUNITY)
Admission: RE | Admit: 2017-09-27 | Discharge: 2017-09-27 | Disposition: A | Discharge status: Home / Self Care | Payer: BLUE CROSS/BLUE SHIELD | Source: Ambulatory Visit | Attending: Pulmonary Disease | Admitting: Pulmonary Disease

## 2017-09-27 VITALS — Wt 238.3 lb

## 2017-09-27 DIAGNOSIS — J849 Interstitial pulmonary disease, unspecified: Secondary | ICD-10-CM | POA: Diagnosis not present

## 2017-09-27 NOTE — Progress Notes (Signed)
Daily Session Note  Patient Details  Name: Tasha Ballard MRN: [id number] Date of Birth: January 21, 1971 Referring Provider:     Pulmonary Rehab Walk Test from 08/11/2017 in [place]  Referring Provider  Dr. Rayann Heman ]      Encounter Date: 09/27/2017  Check In: Session Check In - 09/27/17 1043      Check-In   Supervising physician immediately available to respond to emergencies  [facility] immediately available    Physician(s)  Dr. Reesa Chew    Location  [facility]    Staff Present  Su Hilt, MS, ACSM RCEP, Exercise Physiologist;Gennette Shadix Ysidro Evert, RN;Carlette Carlton, RN, BSN    Medication changes reported      No    Fall or balance concerns reported     No    Tobacco Cessation  No Change    Warm-up and Cool-down  Performed as group-led instruction    Resistance Training Performed  Yes    VAD Patient?  No    PAD/SET Patient?  No      Pain Assessment   Currently in Pain?  No/denies    Multiple Pain Sites  No       Capillary Blood Glucose: No results found for this or any previous visit (from the past 24 hour(s)). POCT Glucose - 09/27/17 1231      POCT Blood Glucose   Pre-Exercise  147 mg/dL    Post-Exercise  176 mg/dL      Exercise Prescription Changes - 09/27/17 1200      Response to Exercise   Blood Pressure (Admit)  124/81    Blood Pressure (Exercise)  118/60    Blood Pressure (Exit)  120/60    Heart Rate (Admit)  107 bpm    Heart Rate (Exercise)  123 bpm    Heart Rate (Exit)  99 bpm    Oxygen Saturation (Admit)  98 %    Oxygen Saturation (Exercise)  91 %    Oxygen Saturation (Exit)  99 %    Rating of Perceived Exertion (Exercise)  13    Perceived Dyspnea (Exercise)  3    Duration  Progress to 45 minutes of aerobic exercise without signs/symptoms of physical distress    Intensity  THRR unchanged      Progression   Progression  Continue to progress workloads to maintain intensity without signs/symptoms  of physical distress.      Resistance Training   Training Prescription  Yes    Weight  blue bands    Reps  10-15    Time  10 Minutes      Interval Training   Interval Training  No      Oxygen   Oxygen  Continuous    Liters  15      NuStep   Level  2    SPM  80    Minutes  17    METs  1.4      Track   Laps  8    Minutes  17       Social History   Tobacco Use  Smoking Status Never Smoker  Smokeless Tobacco Never Used    Goals Met:  Exercise tolerated well No report of cardiac concerns or symptoms Strength training completed today  Goals Unmet:  Not Applicable  Comments: Service time is from 1030 to 1215    Dr. Rush Farmer is Medical Director for Pulmonary Rehab at [facility].

## 2017-09-29 ENCOUNTER — Encounter (HOSPITAL_COMMUNITY)
Admission: RE | Admit: 2017-09-29 | Discharge: 2017-09-29 | Disposition: A | Discharge status: Home / Self Care | Payer: BLUE CROSS/BLUE SHIELD | Source: Ambulatory Visit | Attending: Pulmonary Disease | Admitting: Pulmonary Disease

## 2017-09-29 DIAGNOSIS — J849 Interstitial pulmonary disease, unspecified: Secondary | ICD-10-CM | POA: Diagnosis not present

## 2017-09-29 NOTE — Progress Notes (Signed)
Daily Session Note  Patient Details  Name: Tasha Ballard MRN: [id number] Date of Birth: 01/27/1971 Referring Provider:     Pulmonary Rehab Walk Test from 08/11/2017 in [place]  Referring Provider  Dr. Rayann Heman ]      Encounter Date: 09/29/2017  Check In: Session Check In - 09/29/17 1232      Check-In   Supervising physician immediately available to respond to emergencies  [facility] immediately available    Physician(s)  Dr. [place] Pink    Location  [facility]    Staff Present  Su Hilt, MS, ACSM RCEP, Exercise Physiologist;Lisa Colletta [place], RN, MHA    Medication changes reported      No    Fall or balance concerns reported     No    Tobacco Cessation  No Change    Warm-up and Cool-down  Performed as group-led instruction    Resistance Training Performed  Yes    VAD Patient?  No    PAD/SET Patient?  No      Pain Assessment   Currently in Pain?  No/denies    Multiple Pain Sites  No       Capillary Blood Glucose: No results found for this or any previous visit (from the past 24 hour(s)).    Social History   Tobacco Use  Smoking Status Never Smoker  Smokeless Tobacco Never Used    Goals Met:  Achieving weight loss Exercise tolerated well Personal goals reviewed  Goals Unmet:  Not Applicable  Comments: Service time is from 10:30a to 12:15p    Dr. Rush Farmer is Medical Director for Pulmonary Rehab at [facility].

## 2017-09-29 NOTE — Progress Notes (Deleted)
Daily Session Note  Patient Details  Name: Tasha Ballard MRN: [id number] Date of Birth: [birth year] Referring Provider:     Pulmonary Rehab Walk Test from 08/11/2017 in [place]  Referring Provider  Dr. Rayann Heman ]      Encounter Date: 09/29/2017  Check In: Session Check In - 09/29/17 1232      Check-In   Supervising physician immediately available to respond to emergencies  [facility] immediately available    Physician(s)  Dr. [place] Pink    Location  [facility]    Staff Present  Su Hilt, MS, ACSM RCEP, Exercise Physiologist;Tyannah Sane Colletta [place], RN, MHA    Medication changes reported      No    Fall or balance concerns reported     No    Tobacco Cessation  No Change    Warm-up and Cool-down  Performed as group-led instruction    Resistance Training Performed  Yes    VAD Patient?  No    PAD/SET Patient?  No      Pain Assessment   Currently in Pain?  No/denies    Multiple Pain Sites  No       Capillary Blood Glucose: No results found for this or any previous visit (from the past 24 hour(s)).    Social History   Tobacco Use  Smoking Status Never Smoker  Smokeless Tobacco Never Used    Goals Met:  Exercise tolerated well No report of cardiac concerns or symptoms Strength training completed today  Goals Unmet:  Not Applicable  Comments: Service time is from 1030 to 1235 Blood sugar at end of session 65 gave pt ginger ale and lemon aide drink, recheck blood sugar in 15 minutes 92. Discharged pt in stable condition with a snack to eat on the way home.    Dr. Rush Farmer is Medical Director for Pulmonary Rehab at [facility].

## 2017-10-04 ENCOUNTER — Encounter (HOSPITAL_COMMUNITY): Payer: BLUE CROSS/BLUE SHIELD

## 2017-10-06 ENCOUNTER — Encounter (HOSPITAL_COMMUNITY)
Admission: RE | Admit: 2017-10-06 | Discharge: 2017-10-06 | Disposition: A | Discharge status: Home / Self Care | Payer: BLUE CROSS/BLUE SHIELD | Source: Ambulatory Visit | Attending: Pulmonary Disease | Admitting: Pulmonary Disease

## 2017-10-06 DIAGNOSIS — J849 Interstitial pulmonary disease, unspecified: Secondary | ICD-10-CM | POA: Diagnosis not present

## 2017-10-06 NOTE — Progress Notes (Signed)
Daily Session Note  Patient Details  Name: Tasha Ballard MRN: [id number] Date of Birth: 03-13-1970 Referring Provider:     Pulmonary Rehab Walk Test from 08/11/2017 in [place]  Referring Provider  Dr. Rayann Heman ]      Encounter Date: 10/06/2017  Check In: Session Check In - 10/06/17 1055      Check-In   Supervising physician immediately available to respond to emergencies  [facility] immediately available    Physician(s)  Dr. Florene Glen    Location  [facility]    Staff Present  Su Hilt, MS, ACSM RCEP, Exercise Physiologist;Annedrea Rosezella [place], RN, MHA;Carlette Wilber Oliphant, RN, BSN    Medication changes reported      No    Fall or balance concerns reported     No    Tobacco Cessation  No Change    Warm-up and Cool-down  Performed as group-led Higher education careers adviser Performed  Yes    VAD Patient?  No    PAD/SET Patient?  No      Pain Assessment   Currently in Pain?  No/denies    Multiple Pain Sites  No       Capillary Blood Glucose: No results found for this or any previous visit (from the past 24 hour(s)).    Social History   Tobacco Use  Smoking Status Never Smoker  Smokeless Tobacco Never Used    Goals Met:  Achieving weight loss Exercise tolerated well Personal goals reviewed  Goals Unmet:  Not Applicable  Comments: Service time is from 10:30a to 12:30p    Dr. Rush Farmer is Medical Director for Pulmonary Rehab at [facility].

## 2017-10-10 DIAGNOSIS — J320 Chronic maxillary sinusitis: Secondary | ICD-10-CM | POA: Insufficient documentation

## 2017-10-11 ENCOUNTER — Encounter (HOSPITAL_COMMUNITY)
Admission: RE | Admit: 2017-10-11 | Discharge: 2017-10-11 | Disposition: A | Discharge status: Home / Self Care | Payer: BLUE CROSS/BLUE SHIELD | Source: Ambulatory Visit | Attending: Pulmonary Disease | Admitting: Pulmonary Disease

## 2017-10-11 DIAGNOSIS — J849 Interstitial pulmonary disease, unspecified: Secondary | ICD-10-CM | POA: Diagnosis not present

## 2017-10-11 NOTE — Progress Notes (Signed)
Daily Session Note  Patient Details  Name: Tasha Ballard MRN: [id number] Date of Birth: 10/19/1970 Referring Provider:     Pulmonary Rehab Walk Test from 08/11/2017 in [facility]  Referring Provider  Dr. Yacoub [[facility] ]      Encounter Date: 10/11/2017  Check In: Session Check In - 10/11/17 1233      Check-In   Supervising physician immediately available to respond to emergencies  [facility] immediately available    Physician(s)  Dr. Powell    Location  [facility]    Staff Present   , MS, ACSM RCEP, Exercise Physiologist;Annedrea Stackhouse, RN, MHA;Carlette Carlton, RN, BSN;Lisa Hughes, RN    Medication changes reported      No    Fall or balance concerns reported     No    Tobacco Cessation  No Change    Warm-up and Cool-down  Performed as group-led instruction    Resistance Training Performed  Yes    VAD Patient?  No    PAD/SET Patient?  No      Pain Assessment   Currently in Pain?  No/denies       Capillary Blood Glucose: No results found for this or any previous visit (from the past 24 hour(s)).  Exercise Prescription Changes - 10/11/17 1200      Response to Exercise   Blood Pressure (Admit)  110/78    Blood Pressure (Exercise)  120/74    Blood Pressure (Exit)  114/70    Heart Rate (Admit)  94 bpm    Heart Rate (Exercise)  110 bpm    Heart Rate (Exit)  99 bpm    Oxygen Saturation (Admit)  100 %    Oxygen Saturation (Exercise)  100 %    Oxygen Saturation (Exit)  100 %    Rating of Perceived Exertion (Exercise)  12    Perceived Dyspnea (Exercise)  2    Duration  Progress to 45 minutes of aerobic exercise without signs/symptoms of physical distress    Intensity  THRR unchanged      Progression   Progression  Continue to progress workloads to maintain intensity without signs/symptoms of physical distress.      Resistance Training   Training Prescription  Yes    Weight  blue bands    Reps   10-15    Time  10 Minutes      Interval Training   Interval Training  No      Oxygen   Oxygen  Continuous    Liters  15      NuStep   Level  2    SPM  80    Minutes  17    METs  1.4      Arm Ergometer   Level  2    Minutes  17       Social History   Tobacco Use  Smoking Status Never Smoker  Smokeless Tobacco Never Used    Goals Met:  Exercise tolerated well Personal goals reviewed  Goals Unmet:  Not Applicable  Comments: Service time is from 10:30a to 12:30p    Dr. Wesam G. Yacoub is Medical Director for Pulmonary Rehab at  Hospital. 

## 2017-10-11 NOTE — Progress Notes (Signed)
Pulmonary Individual Treatment Plan  Patient Details  Name: ZLATA ALCAIDE MRN: [id number] Date of Birth: [birth year] Referring Provider:     Pulmonary Rehab Walk Test from 08/11/2017 in [facility]  Referring Provider  Dr. Rayann Heman ]      Initial Encounter Date:    Pulmonary Rehab Walk Test from 08/11/2017 in [place]  Date  08/12/17      Visit Diagnosis: ILD (interstitial lung disease) ([place])  Patient's Home Medications on Admission:   Current Outpatient Medications:  .  acyclovir (ZOVIRAX) 400 MG tablet, Take 400 mg by mouth 3 (three) times daily., Disp: , Rfl:  .  apixaban (ELIQUIS) 5 MG TABS tablet, Take 5 mg by mouth 2 (two) times daily., Disp: , Rfl:  .  benzonatate (TESSALON) 100 MG capsule, Take by mouth 3 (three) times daily as needed for cough., Disp: , Rfl:  .  carvedilol (COREG) 6.25 MG tablet, Take 6.25 mg by mouth 2 (two) times daily with a meal., Disp: , Rfl:  .  cholecalciferol (VITAMIN D) 400 units TABS tablet, Take 800 Units by mouth. Pt takes 1,000 units daily, Disp: , Rfl:  .  furosemide (LASIX) 40 MG tablet, Take 40 mg by mouth daily. 40 mg daily but on days of swelling twice a day, Disp: , Rfl:  .  metFORMIN (GLUCOPHAGE) 500 MG tablet, Take by mouth 2 (two) times daily with a meal., Disp: , Rfl:  .  montelukast (SINGULAIR) 10 MG tablet, Take 10 mg by mouth daily after supper., Disp: , Rfl:  .  mycophenolate (CELLCEPT) 500 MG tablet, Take 1,500 mg by mouth 2 (two) times daily. Takes 3 500 mg twice a day, Disp: , Rfl:  .  predniSONE (DELTASONE) 10 MG tablet, Take 10 mg by mouth daily with breakfast., Disp: , Rfl:  .  spironolactone (ALDACTONE) 100 MG tablet, Take 100 mg by mouth daily., Disp: , Rfl:  .  Sulfamethoxazole-Trimethoprim (SEPTRA DS PO), Take by mouth daily. 400-80  Once a day while on prednisone, Disp: , Rfl:  .  thyroid (ARMOUR) 30 MG tablet, Take 60 mg by mouth daily before  breakfast. Takes 60 mg tablet, Disp: , Rfl:   Past Medical History: Past Medical History:  Diagnosis Date  . Aneurysm ([place])   . Diabetes mellitus without complication ([place])   . Thyroid disease     Tobacco Use: Social History   Tobacco Use  Smoking Status Never Smoker  Smokeless Tobacco Never Used    Labs: Recent Review Flowsheet Data    There is no flowsheet data to display.      Capillary Blood Glucose: Lab Results  Component Value Date   GLUCAP 162 (H) 09/01/2017   POCT Glucose    Row Name 08/30/17 1246 09/13/17 1255 09/27/17 1231 10/11/17 1252       POCT Blood Glucose   Pre-Exercise  137 mg/dL  191 mg/dL  147 mg/dL  176 mg/dL    Post-Exercise  -  219 mg/dL  176 mg/dL  179 mg/dL       Pulmonary Assessment Scores: Pulmonary Assessment Scores    Row Name 08/12/17 1055         ADL UCSD   ADL Phase  Entry       mMRC Score   mMRC Score  3        Pulmonary Function Assessment:   Exercise Target Goals: Exercise Program Goal: Individual exercise prescription set using results from  initial 6 min walk test and THRR while considering  patient's activity barriers and safety.   Exercise Prescription Goal: Initial exercise prescription builds to 30-45 minutes a day of aerobic activity, 2-3 days per week.  Home exercise guidelines will be given to patient during program as part of exercise prescription that the participant will acknowledge.  Activity Barriers & Risk Stratification: Activity Barriers & Cardiac Risk Stratification - 08/10/17 1442      Activity Barriers & Cardiac Risk Stratification   Activity Barriers  Deconditioning;Shortness of Breath       6 Minute Walk: 6 Minute Walk    Row Name 08/12/17 1047         6 Minute Walk   Phase  Initial     Distance  800 feet     Walk Time  - 5 minutes 40 seconds     # of Rest Breaks  1 1 rest break initiated by EP due to low oxygen saturation     MPH  1.5     METS  2.15     RPE  13     Perceived  Dyspnea   2     Symptoms  No     Resting HR  114 bpm     Resting BP  148/81     Resting Oxygen Saturation   97 %     Exercise Oxygen Saturation  during 6 min walk  80 %     Max Ex. HR  146 bpm     Max Ex. BP  136/81       Interval HR   1 Minute HR  118     2 Minute HR  129     3 Minute HR  135     4 Minute HR  135     5 Minute HR  146     6 Minute HR  145     Interval Heart Rate?  Yes       Interval Oxygen   Interval Oxygen?  Yes     Baseline Oxygen Saturation %  97 %     1 Minute Oxygen Saturation %  92 %     1 Minute Liters of Oxygen  8 L     2 Minute Oxygen Saturation %  80 %     2 Minute Liters of Oxygen  8 L     3 Minute Oxygen Saturation %  88 %     3 Minute Liters of Oxygen  10 L     4 Minute Oxygen Saturation %  86 %     4 Minute Liters of Oxygen  10 L     5 Minute Oxygen Saturation %  91 %     5 Minute Liters of Oxygen  15 L     6 Minute Oxygen Saturation %  91 %     6 Minute Liters of Oxygen  15 L        Oxygen Initial Assessment: Oxygen Initial Assessment - 08/12/17 1046      Initial 6 min Walk   Oxygen Used  Continuous;E-Tanks    Liters per minute  15   titrated from 8-15     Program Oxygen Prescription   Program Oxygen Prescription  Continuous;E-Tanks    Liters per minute  15       Oxygen Re-Evaluation: Oxygen Re-Evaluation    Row Name 08/19/17 1045 09/12/17 0912 10/10/17 0912  Program Oxygen Prescription   Program Oxygen Prescription  Continuous;E-Tanks  Continuous;E-Tanks  Continuous;E-Tanks     Liters per minute  _0 Comments  Pt wears 6 liters at rest (nasal cannula) and 10 liters on exertion  Pt wears 6 liters at rest (nasal cannula) and 10 liters on exertion  Pt wears 6 liters at rest (nasal cannula) and 10 liters on exertion       Home Oxygen   Home Oxygen Device  Portable Concentrator;Home Concentrator  Portable Concentrator;Home Concentrator  -     Sleep Oxygen Prescription  Continuous  Continuous  Continuous      Liters per minute  _1 Home Exercise Oxygen Prescription  Continuous  Continuous  Continuous     Liters per minute  _2 Home at Rest Exercise Oxygen Prescription  Continuous  Continuous  Continuous     Liters per minute  _3 Compliance with Home Oxygen Use  Yes  Yes  Yes       Goals/Expected Outcomes   Short Term Goals  To learn and exhibit compliance with exercise, home and travel O2 prescription;To learn and understand importance of monitoring SPO2 with pulse oximeter and demonstrate accurate use of the pulse oximeter.;To learn and understand importance of maintaining oxygen saturations>88%;To learn and demonstrate proper pursed lip breathing techniques or other breathing techniques.;To learn and demonstrate proper use of respiratory medications  To learn and exhibit compliance with exercise, home and travel O2 prescription;To learn and understand importance of monitoring SPO2 with pulse oximeter and demonstrate accurate use of the pulse oximeter.;To learn and understand importance of maintaining oxygen saturations>88%;To learn and demonstrate proper pursed lip breathing techniques or other breathing techniques.;To learn and demonstrate proper use of respiratory medications  To learn and exhibit compliance with exercise, home and travel O2 prescription;To learn and understand importance of monitoring SPO2 with pulse oximeter and demonstrate accurate use of the pulse oximeter.;To learn and understand importance of maintaining oxygen saturations>88%;To learn and demonstrate proper pursed lip breathing techniques or other breathing techniques.;To learn and demonstrate proper use of respiratory medications     Long  Term Goals  Exhibits compliance with exercise, home and travel O2 prescription;Verbalizes importance of monitoring SPO2 with pulse oximeter and return demonstration;Maintenance of O2 saturations>88%;Exhibits proper breathing techniques, such as pursed lip  breathing or other method taught during program session;Compliance with respiratory medication;Demonstrates proper use of MDI's  Exhibits compliance with exercise, home and travel O2 prescription;Verbalizes importance of monitoring SPO2 with pulse oximeter and return demonstration;Maintenance of O2 saturations>88%;Exhibits proper breathing techniques, such as pursed lip breathing or other method taught during program session;Compliance with respiratory medication;Demonstrates proper use of MDI's  Exhibits compliance with exercise, home and travel O2 prescription;Verbalizes importance of monitoring SPO2 with pulse oximeter and return demonstration;Maintenance of O2 saturations>88%;Exhibits proper breathing techniques, such as pursed lip breathing or other method taught during program session;Compliance with respiratory medication;Demonstrates proper use of MDI's     Goals/Expected Outcomes  -  -  Through exercise at rehab and at home, the patient will decrease shortness of breath with daily activities and feel confident in carrying out an exercise regime at home.         Oxygen Discharge (Final Oxygen Re-Evaluation): Oxygen Re-Evaluation - 10/10/17 0912      Program Oxygen Prescription   Program Oxygen Prescription  Continuous;E-Tanks    Liters per minute  15    Comments  Pt wears 6 liters at rest (nasal cannula) and 10 liters on exertion      Home Oxygen   Sleep Oxygen Prescription  Continuous    Liters per minute  6    Home Exercise Oxygen Prescription  Continuous    Liters per minute  15    Home at Rest Exercise Oxygen Prescription  Continuous    Liters per minute  10    Compliance with Home Oxygen Use  Yes      Goals/Expected Outcomes   Short Term Goals  To learn and exhibit compliance with exercise, home and travel O2 prescription;To learn and understand importance of monitoring SPO2 with pulse oximeter and demonstrate accurate use of the pulse oximeter.;To learn and understand  importance of maintaining oxygen saturations>88%;To learn and demonstrate proper pursed lip breathing techniques or other breathing techniques.;To learn and demonstrate proper use of respiratory medications    Long  Term Goals  Exhibits compliance with exercise, home and travel O2 prescription;Verbalizes importance of monitoring SPO2 with pulse oximeter and return demonstration;Maintenance of O2 saturations>88%;Exhibits proper breathing techniques, such as pursed lip breathing or other method taught during program session;Compliance with respiratory medication;Demonstrates proper use of MDI's    Goals/Expected Outcomes  Through exercise at rehab and at home, the patient will decrease shortness of breath with daily activities and feel confident in carrying out an exercise regime at home.        Initial Exercise Prescription: Initial Exercise Prescription - 08/12/17 1000      Date of Initial Exercise RX and Referring Provider   Date  08/12/17    Referring Provider  Dr. Rayann Heman      Oxygen   Oxygen  Continuous    Liters  15      NuStep   Level  2    SPM  80    Minutes  17    METs  1.5      Arm Ergometer   Level  2    Watts  10    Minutes  17      Track   Laps  8    Minutes  17      Prescription Details   Frequency (times per week)  2    Duration  Progress to 45 minutes of aerobic exercise without signs/symptoms of physical distress      Intensity   THRR 40-80% of Max Heartrate  69-138    Ratings of Perceived Exertion  11-13    Perceived Dyspnea  0-4      Progression   Progression  Continue progressive overload as per policy without signs/symptoms or physical distress.      Resistance Training   Training Prescription  Yes    Weight  blue bands    Reps  10-15       Perform Capillary Blood Glucose checks as needed.  Exercise Prescription Changes:  Exercise Prescription Changes    Row Name 08/30/17 1200 09/13/17 1200 09/27/17 1200 10/11/17 1200        Response to Exercise   Blood Pressure (Admit)  118/70  112/70  124/81  110/78    Blood Pressure (Exercise)  124/70  108/78  118/60  120/74    Blood Pressure (Exit)  118/76  110/70  120/60  114/70    Heart Rate (Admit)  102 bpm  104 bpm  107 bpm  94 bpm  Heart Rate (Exercise)  118 bpm  114 bpm  123 bpm  110 bpm    Heart Rate (Exit)  108 bpm  105 bpm  99 bpm  99 bpm    Oxygen Saturation (Admit)  98 %  97 %  98 %  100 %    Oxygen Saturation (Exercise)  95 %  94 %  91 %  100 %    Oxygen Saturation (Exit)  99 %  99 %  99 %  100 %    Rating of Perceived Exertion (Exercise)  _0 Perceived Dyspnea (Exercise)  _1 Duration  Progress to 45 minutes of aerobic exercise without signs/symptoms of physical distress  Progress to 45 minutes of aerobic exercise without signs/symptoms of physical distress  Progress to 45 minutes of aerobic exercise without signs/symptoms of physical distress  Progress to 45 minutes of aerobic exercise without signs/symptoms of physical distress    Intensity  - 40-80% HRR  THRR unchanged  THRR unchanged  THRR unchanged      Progression   Progression  Continue to progress workloads to maintain intensity without signs/symptoms of physical distress.  Continue to progress workloads to maintain intensity without signs/symptoms of physical distress.  Continue to progress workloads to maintain intensity without signs/symptoms of physical distress.  Continue to progress workloads to maintain intensity without signs/symptoms of physical distress.      Resistance Training   Training Prescription  Yes  Yes  Yes  Yes    Weight  blue bands  blue bands  blue bands  blue bands    Reps  10-15  10-15  10-15  10-15    Time  10 Minutes  10 Minutes  10 Minutes  10 Minutes      Interval Training   Interval Training  No  No  No  No      Oxygen   Oxygen  Continuous  Continuous  Continuous  Continuous    Liters  _2 NuStep   Level  _3 SPM   80  80  80  80    Minutes  _4 METs  -  1.4  1.4  1.4      Arm Ergometer   Level  2  2  -  2    Watts  10  10  -  -    Minutes  17  17  -  17      Track   Laps  _5 -    Minutes  _6 -       Exercise Comments:   Exercise Goals and Review:  Exercise Goals    Row Name 08/10/17 1444             Exercise Goals   Increase Physical Activity  Yes       Intervention  Provide advice, education, support and counseling about physical activity/exercise needs.;Develop an individualized exercise prescription for aerobic and resistive training based on initial evaluation findings, risk stratification, comorbidities and participant's personal goals.       Expected Outcomes  Short Term: Attend rehab on a regular basis to increase amount of physical activity.;Long Term: Add in home exercise to  make exercise part of routine and to increase amount of physical activity.;Long Term: Exercising regularly at least 3-5 days a week.       Increase Strength and Stamina  Yes       Intervention  Provide advice, education, support and counseling about physical activity/exercise needs.;Develop an individualized exercise prescription for aerobic and resistive training based on initial evaluation findings, risk stratification, comorbidities and participant's personal goals.       Expected Outcomes  Short Term: Increase workloads from initial exercise prescription for resistance, speed, and METs.;Short Term: Perform resistance training exercises routinely during rehab and add in resistance training at home;Long Term: Improve cardiorespiratory fitness, muscular endurance and strength as measured by increased METs and functional capacity (6MWT)       Able to understand and use rate of perceived exertion (RPE) scale  Yes       Intervention  Provide education and explanation on how to use RPE scale       Expected Outcomes  Short Term: Able to use RPE daily in rehab to express subjective  intensity level;Long Term:  Able to use RPE to guide intensity level when exercising independently       Able to understand and use Dyspnea scale  Yes       Intervention  Provide education and explanation on how to use Dyspnea scale       Expected Outcomes  Short Term: Able to use Dyspnea scale daily in rehab to express subjective sense of shortness of breath during exertion;Long Term: Able to use Dyspnea scale to guide intensity level when exercising independently       Knowledge and understanding of Target Heart Rate Range (THRR)  Yes       Intervention  Provide education and explanation of THRR including how the numbers were predicted and where they are located for reference       Expected Outcomes  Short Term: Able to state/look up THRR;Long Term: Able to use THRR to govern intensity when exercising independently;Short Term: Able to use daily as guideline for intensity in rehab       Understanding of Exercise Prescription  Yes       Intervention  Provide education, explanation, and written materials on patient's individual exercise prescription       Expected Outcomes  Short Term: Able to explain program exercise prescription;Long Term: Able to explain home exercise prescription to exercise independently          Exercise Goals Re-Evaluation : Exercise Goals Re-Evaluation    Row Name 08/19/17 1046 09/12/17 0913 10/10/17 0914         Exercise Goal Re-Evaluation   Exercise Goals Review  Increase Physical Activity;Able to understand and use rate of perceived exertion (RPE) scale;Knowledge and understanding of Target Heart Rate Range (THRR);Understanding of Exercise Prescription;Increase Strength and Stamina;Able to understand and use Dyspnea scale;Able to check pulse independently  Increase Physical Activity;Able to understand and use rate of perceived exertion (RPE) scale;Knowledge and understanding of Target Heart Rate Range (THRR);Understanding of Exercise Prescription;Increase Strength and  Stamina;Able to understand and use Dyspnea scale  Increase Physical Activity;Able to understand and use rate of perceived exertion (RPE) scale;Knowledge and understanding of Target Heart Rate Range (THRR);Understanding of Exercise Prescription;Increase Strength and Stamina;Able to understand and use Dyspnea scale     Comments  Patient's first day of exercise will be 08/23/17  Patient has only attended 4 rehab sessions. Will cont. to monitor and progress as able.   Patient's progress is  variable. Some days are better than others. She is able to walk on average 7-8 laps (200 ft each) in 15 minutes. Barriers have included sinus issues, high flow oxygen needs. Will cont to monitor and progress as able.      Expected Outcomes  Through exercise at rehab and at home, the patient will decrease shortness of breath with daily activities and feel confident in carrying out an exercise regime at home.   Through exercise at rehab and at home, the patient will decrease shortness of breath with daily activities and feel confident in carrying out an exercise regime at home.   Through exercise at rehab and at home, the patient will decrease shortness of breath with daily activities and feel confident in carrying out an exercise regime at home.         Discharge Exercise Prescription (Final Exercise Prescription Changes): Exercise Prescription Changes - 10/11/17 1200      Response to Exercise   Blood Pressure (Admit)  110/78    Blood Pressure (Exercise)  120/74    Blood Pressure (Exit)  114/70    Heart Rate (Admit)  94 bpm    Heart Rate (Exercise)  110 bpm    Heart Rate (Exit)  99 bpm    Oxygen Saturation (Admit)  100 %    Oxygen Saturation (Exercise)  100 %    Oxygen Saturation (Exit)  100 %    Rating of Perceived Exertion (Exercise)  12    Perceived Dyspnea (Exercise)  2    Duration  Progress to 45 minutes of aerobic exercise without signs/symptoms of physical distress    Intensity  THRR unchanged       Progression   Progression  Continue to progress workloads to maintain intensity without signs/symptoms of physical distress.      Resistance Training   Training Prescription  Yes    Weight  blue bands    Reps  10-15    Time  10 Minutes      Interval Training   Interval Training  No      Oxygen   Oxygen  Continuous    Liters  15      NuStep   Level  2    SPM  80    Minutes  17    METs  1.4      Arm Ergometer   Level  2    Minutes  17       Nutrition:  Target Goals: Understanding of nutrition guidelines, daily intake of sodium <1576m, cholesterol <2022m calories 30% from fat and 7% or less from saturated fats, daily to have 5 or more servings of fruits and vegetables.  Biometrics:    Nutrition Therapy Plan and Nutrition Goals: Nutrition Therapy & Goals - 10/11/17 1214      Nutrition Therapy   Diet  general healthful      Personal Nutrition Goals   Nutrition Goal  Identify food quantities necessary to achieve wt loss of  -2# per week to a goal wt loss of 6-24 lb at graduation from pulmonary rehab.      Intervention Plan   Intervention  Prescribe, educate and counsel regarding individualized specific dietary modifications aiming towards targeted core components such as weight, hypertension, lipid management, diabetes, heart failure and other comorbidities.    Expected Outcomes  Short Term Goal: Understand basic principles of dietary content, such as calories, fat, sodium, cholesterol and nutrients.       Nutrition Assessments: Nutrition Assessments -  10/11/17 1214      Rate Your Plate Scores   Pre Score  56       Nutrition Goals Re-Evaluation:   Nutrition Goals Discharge (Final Nutrition Goals Re-Evaluation):   Psychosocial: Target Goals: Acknowledge presence or absence of significant depression and/or stress, maximize coping skills, provide positive support system. Participant is able to verbalize types and ability to use techniques and skills needed  for reducing stress and depression.  Initial Review & Psychosocial Screening: Initial Psych Review & Screening - 08/10/17 1439      Initial Review   Current issues with  Current Stress Concerns    Source of Stress Concerns  Transportation;Occupation      Family Dynamics   [place]?  Yes      Barriers   Psychosocial barriers to participate in program  The patient should benefit from training in stress management and relaxation.      Screening Interventions   Interventions  Encouraged to exercise    Expected Outcomes  Long Term goal: The participant improves quality of Life and PHQ9 Scores as seen by post scores and/or verbalization of changes;Long Term Goal: Stressors or current issues are controlled or eliminated.       Quality of Life Scores:  Scores of 19 and below usually indicate a poorer quality of life in these areas.  A difference of  2-3 points is a clinically meaningful difference.  A difference of 2-3 points in the total score of the Quality of Life Index has been associated with significant improvement in overall quality of life, self-image, physical symptoms, and general health in studies assessing change in quality of life.  PHQ-9: Recent Review Flowsheet Data    Depression screen [facility] 2/9 08/10/2017   Decreased Interest 3   Down, Depressed, Hopeless 1   PHQ - 2 Score 4   Altered sleeping 3   Tired, decreased energy 3   Change in appetite 3   Feeling bad or failure about yourself  3   Trouble concentrating 1   Moving slowly or fidgety/restless 0   Suicidal thoughts 0   PHQ-9 Score 17   Difficult doing work/chores Very difficult     Interpretation of Total Score  Total Score Depression Severity:  1-4 = Minimal depression, 5-9 = Mild depression, 10-14 = Moderate depression, 15-19 = Moderately severe depression, 20-27 = Severe depression   Psychosocial Evaluation and Intervention: Psychosocial Evaluation - 10/11/17 1009      Psychosocial Evaluation  & Interventions   Interventions  Encouraged to exercise with the program and follow exercise prescription;Relaxation education;Stress management education    Comments  Pt received news from her pulmonogist that she has "5 months". This was devestating news for pt and her husband. Pt reports crying spells due to the realization of where she is with her disease process and overal limitations due to deconditioning and weight.  Pt is seeking second opinions outside of [place].  This is how she is coping and dealing with this by being proactive so that she can make peace knowing she has done all she can.      Expected Outcomes  Continue to display hopeful and positive outlook on life    Continue Psychosocial Services   Follow up required by staff       Psychosocial Re-Evaluation: Psychosocial Re-Evaluation    [place] Name 09/12/17 1514 10/11/17 1012           Psychosocial Re-Evaluation   Current issues with  Current  Stress Concerns;Current Anxiety/Panic  Current Stress Concerns;Current Anxiety/Panic      Comments  Pt is not driving and does not feel independent.  this is a source of her stress and anxiety.  Pt is uncertain of what her future will look like.    Pt is not driving and does not feel independent.  this is a source of her stress and anxiety.  Pt is uncertain of what her future will look like.        Interventions  Stress management education;Encouraged to attend Pulmonary Rehabilitation for the exercise;Relaxation education  Stress management education;Encouraged to attend Pulmonary Rehabilitation for the exercise;Relaxation education        Initial Review   Source of Stress Concerns  Transportation;Occupation  Transport planner;Occupation         Psychosocial Discharge (Final Psychosocial Re-Evaluation): Psychosocial Re-Evaluation - 10/11/17 1012      Psychosocial Re-Evaluation   Current issues with  Current Stress Concerns;Current Anxiety/Panic    Comments  Pt is not driving and  does not feel independent.  this is a source of her stress and anxiety.  Pt is uncertain of what her future will look like.      Interventions  Stress management education;Encouraged to attend Pulmonary Rehabilitation for the exercise;Relaxation education      Initial Review   Source of Stress Concerns  Transportation;Occupation       Education: Education Goals: Education classes will be provided on a weekly basis, covering required topics. Participant will state understanding/return demonstration of topics presented.  Learning Barriers/Preferences: Learning Barriers/Preferences - 08/10/17 1441      Learning Barriers/Preferences   Learning Barriers  Sight    Learning Preferences  Computer/Internet;Group Instruction;Individual Instruction;Written Material       Education Topics: Risk Factor Reduction:  -Group instruction that is supported by a PowerPoint presentation. Instructor discusses the definition of a risk factor, different risk factors for pulmonary disease, and how the heart and lungs work together.     Nutrition for Pulmonary Patient:  -Group instruction provided by PowerPoint slides, verbal discussion, and written materials to support subject matter. The instructor gives an explanation and review of healthy diet recommendations, which includes a discussion on weight management, recommendations for fruit and vegetable consumption, as well as protein, fluid, caffeine, fiber, sodium, sugar, and alcohol. Tips for eating when patients are short of breath are discussed.   Pursed Lip Breathing:  -Group instruction that is supported by demonstration and informational handouts. Instructor discusses the benefits of pursed lip and diaphragmatic breathing and detailed demonstration on how to preform both.     Oxygen Safety:  -Group instruction provided by PowerPoint, verbal discussion, and written material to support subject matter. There is an overview of "What is Oxygen" and "Why do  we need it".  Instructor also reviews how to create a safe environment for oxygen use, the importance of using oxygen as prescribed, and the risks of noncompliance. There is a brief discussion on traveling with oxygen and resources the patient may utilize.   PULMONARY REHAB OTHER RESPIRATORY from 10/06/2017 in [place]  Date  09/08/17  Educator  Cloyde Reams  Instruction Review Code  1- Verbalizes Understanding      Oxygen Equipment:  -Group instruction provided by [employer] utilizing handouts, written materials, and Insurance underwriter.   Signs and Symptoms:  -Group instruction provided by written material and verbal discussion to support subject matter. Warning signs and symptoms of infection, stroke, and heart attack are reviewed  and when to call the physician/911 reinforced. Tips for preventing the spread of infection discussed.   PULMONARY REHAB OTHER RESPIRATORY from 10/06/2017 in [place]  Date  09/01/17  Educator  RN  Instruction Review Code  1- Verbalizes Understanding      Advanced Directives:  -Group instruction provided by verbal instruction and written material to support subject matter. Instructor reviews Advanced Directive laws and proper instruction for filling out document.   Pulmonary Video:  -Group video education that reviews the importance of medication and oxygen compliance, exercise, good nutrition, pulmonary hygiene, and pursed lip and diaphragmatic breathing for the pulmonary patient.   Exercise for the Pulmonary Patient:  -Group instruction that is supported by a PowerPoint presentation. Instructor discusses benefits of exercise, core components of exercise, frequency, duration, and intensity of an exercise routine, importance of utilizing pulse oximetry during exercise, safety while exercising, and options of places to exercise outside of rehab.     PULMONARY REHAB OTHER RESPIRATORY from  10/06/2017 in [place]  Date  09/22/17  Educator  Cloyde Reams  Instruction Review Code  1- Verbalizes Understanding      Pulmonary Medications:  -Verbally interactive group education provided by instructor with focus on inhaled medications and proper administration.   Anatomy and Physiology of the Respiratory System and Intimacy:  -Group instruction provided by PowerPoint, verbal discussion, and written material to support subject matter. Instructor reviews respiratory cycle and anatomical components of the respiratory system and their functions. Instructor also reviews differences in obstructive and restrictive respiratory diseases with examples of each. Intimacy, Sex, and Sexuality differences are reviewed with a discussion on how relationships can change when diagnosed with pulmonary disease. Common sexual concerns are reviewed.   MD DAY -A group question and answer session with a medical doctor that allows participants to ask questions that relate to their pulmonary disease state.   PULMONARY REHAB OTHER RESPIRATORY from 10/06/2017 in [place]  Date  09/27/17  Educator  yacoub  Instruction Review Code  2- Demonstrated Understanding      OTHER EDUCATION -Group or individual verbal, written, or video instructions that support the educational goals of the pulmonary rehab program.   Holiday Eating Survival Tips:  -Group instruction provided by PowerPoint slides, verbal discussion, and written materials to support subject matter. The instructor gives patients tips, tricks, and techniques to help them not only survive but enjoy the holidays despite the onslaught of food that accompanies the holidays.   Knowledge Questionnaire Score:   Core Components/Risk Factors/Patient Goals at Admission: Personal Goals and Risk Factors at Admission - 08/10/17 1433      Core Components/Risk Factors/Patient Goals on Admission    Weight  Management  Obesity;Weight Loss    Intervention  Weight Management: Develop a combined nutrition and exercise program designed to reach desired caloric intake, while maintaining appropriate intake of nutrient and fiber, sodium and fats, and appropriate energy expenditure required for the weight goal.;Weight Management: Provide education and appropriate resources to help participant work on and attain dietary goals.;Weight Management/Obesity: Establish reasonable short term and long term weight goals.    Admit Weight  243 lb 6.2 oz (110.4 kg)    Goal Weight: Short Term  200 lb (90.7 kg)    Goal Weight: Long Term  175 lb (79.4 kg)    Expected Outcomes  Short Term: Continue to assess and modify interventions until short term weight is achieved;Long Term: Adherence to nutrition  and physical activity/exercise program aimed toward attainment of established weight goal;Weight Loss: Understanding of general recommendations for a balanced deficit meal plan, which promotes 1-2 lb weight loss per week and includes a negative energy balance of [phone] kcal/d;Understanding recommendations for meals to include 15-35% energy as protein, 25-35% energy from fat, 35-60% energy from carbohydrates, less than 27m of dietary cholesterol, 20-35 gm of total fiber daily;Understanding of distribution of calorie intake throughout the day with the consumption of 4-5 meals/snacks    Improve shortness of breath with ADL's  Yes    Intervention  Provide education, individualized exercise plan and daily activity instruction to help decrease symptoms of SOB with activities of daily living.    Expected Outcomes  Short Term: Improve cardiorespiratory fitness to achieve a reduction of symptoms when performing ADLs;Long Term: Be able to perform more ADLs without symptoms or delay the onset of symptoms    Diabetes  Yes    Intervention  Provide education about proper nutrition, including hydration, and aerobic/resistive exercise prescription  along with prescribed medications to achieve blood glucose in normal ranges: Fasting glucose 65-99 mg/dL;Provide education about signs/symptoms and action to take for hypo/hyperglycemia.    Expected Outcomes  Short Term: Participant verbalizes understanding of the signs/symptoms and immediate care of hyper/hypoglycemia, proper foot care and importance of medication, aerobic/resistive exercise and nutrition plan for blood glucose control.;Long Term: Attainment of HbA1C < 7%.    Stress  Yes    Intervention  Offer individual and/or small group education and counseling on adjustment to heart disease, stress management and health-related lifestyle change. Teach and support self-help strategies.;Refer participants experiencing significant psychosocial distress to appropriate mental health specialists for further evaluation and treatment. When possible, include family members and significant others in education/counseling sessions.    Expected Outcomes  Short Term: Participant demonstrates changes in health-related behavior, relaxation and other stress management skills, ability to obtain effective social support, and compliance with psychotropic medications if prescribed.;Long Term: Emotional wellbeing is indicated by absence of clinically significant psychosocial distress or social isolation.       Core Components/Risk Factors/Patient Goals Review:  Goals and Risk Factor Review    Row Name 09/12/17 1335 09/14/17 1147 10/11/17 1012         Core Components/Risk Factors/Patient Goals Review   Personal Goals Review  Weight Management/Obesity;Develop more efficient breathing techniques such as purse lipped breathing and diaphragmatic breathing and practicing self-pacing with activity.;Diabetes;Improve shortness of breath with ADL's;Increase knowledge of respiratory medications and ability to use respiratory devices properly.;Stress  -  Weight Management/Obesity;Develop more efficient breathing techniques such  as purse lipped breathing and diaphragmatic breathing and practicing self-pacing with activity.;Improve shortness of breath with ADL's;Increase knowledge of respiratory medications and ability to use respiratory devices properly.;Stress     Review  Pt off to a great start.  Pt has completed 4 exercise sessions since 08/30/17.  Pt weight shows increase from 107.2 kg to 108.7 kg.  Pt has not engaged in home exerice on her days off due to deconditioning and fear of not knowing what she can do safely on her own.  Will plan to have the Exercise Physiologist talk with pt regarding home exercise.  Pt continue to feel stressed with her lung disease and the uncertainity of what is to come.  Pt has attened oxygen safety class and demonstrates a good working knowledge of her pendulant cannula.  Pt observed having good PLB techniques.   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  Will  resolve this as a patient goal.  Pt off to a great start.  Pt has completed 4 exercise sessions since 08/30/17.  Pt weight shows increase from 107.2 kg to 108.7 kg.  Pt has not engaged in home exerice on her days off due to deconditioning and fear of not knowing what she can do safely on her own.  Will plan to have the Exercise Physiologist talk with pt regarding home exercise.  Pt continue to feel stressed with her lung disease and the uncertainity of what is to come.  Pt has attened oxygen safety class and demonstrates a good working knowledge of her pendulant cannula.  Pt observed having good PLB techniques.   Pt workloads show arm crank level 2, nustep level 2 and track averages 8-10 laps. 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  Will resolve this as a patient goal.   Pt has completed 10 exercise sessions since 08/30/17.  Pt weight shows dcrease from 107.2 kg to 106.5kg.  Dietician to see her regarding weight loss.  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   EP met with pt on 8/1 to talk about home exercise. Pt continue to feel stressed with her lung disease and the uncertainity of what is to come particularly since she received the news she has 5 months.  Pt has attended meditation and mindfulness class.  Continue to support pt in her efforts. Pt observed having good PLB techniques.   Pt workloads show arm crank level 2, nustep level 4 and track averages 15 laps. 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  Will resolve this as a patient goal.     Expected Outcomes  Pt will show progress toward weight loss so that she will be able to be eligible for placement for lung transplant.    Stressors will be  eliminated and/or  will report appropriate and healthy coping skills to deal with stressors.  pt will use proper teechnique of diaphragmatic breathing and PLB which will improve her shortness of breath  See Admission Goals/Outcomes  See Admission Goals/Outcomes        Core Components/Risk Factors/Patient Goals at Discharge (Final Review):  Goals and Risk Factor Review - 10/11/17 1012      Core Components/Risk Factors/Patient Goals Review   Personal Goals Review  Weight Management/Obesity;Develop more efficient breathing techniques such as purse lipped breathing and diaphragmatic breathing and practicing self-pacing with activity.;Improve shortness of breath with ADL's;Increase knowledge of respiratory medications and ability to use respiratory devices properly.;Stress    Review   Pt has completed 10 exercise sessions since 08/30/17.  Pt weight shows dcrease from 107.2 kg to 106.5kg.  Dietician to see her regarding weight loss. 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   EP met with pt on 8/1 to talk about home exercise. Pt continue to feel stressed with her lung disease and the uncertainity of what is to come particularly since she received the news she has 5 months.  Pt has attended meditation and mindfulness  class.  Continue to support pt in her efforts. Pt observed having good PLB techniques.   Pt workloads show arm crank level 2, nustep level 4 and track averages 15 laps. 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  Will resolve this as a patient goal.    Expected Outcomes  See Admission Goals/Outcomes       ITP Comments: ITP Comments    Row Name 08/10/17 1359 09/12/17 1453 10/12/17 0931       ITP Comments  Dr. Jennet Maduro, Medical Director  Dr. Jennet Maduro, Medical Director  Dr. Jennet Maduro, Medical Director        Comments:  Pt has completed 10 exercise sessions since 08/30/16. Cherre Huger, BSN Cardiac and Training and development officer

## 2017-10-11 NOTE — Progress Notes (Signed)
Tasha Ballard 47 y.o. female   DOB: [birth year] MRN: [id number]          Nutrition No diagnosis found. Past Medical History:  Diagnosis Date  . Aneurysm ([place])   . Diabetes mellitus without complication ([place])   . Thyroid disease    Meds reviewed. Coreg, vit d, prednisone, armour, metformin noted  Ht: Ht Readings from Last 1 Encounters:  08/10/17 5' 2" (1.575 m)     Wt:  Wt Readings from Last 3 Encounters:  09/27/17 238 lb 5.1 oz (108.1 kg)  09/13/17 239 lb 6.7 oz (108.6 kg)  09/08/17 239 lb 10.2 oz (108.7 kg)     BMI: 43.59    Current tobacco use? no Labs:  Lipid Panel  No results found for: CHOL, TRIG, HDL, CHOLHDL, VLDL, LDLCALC, LDLDIRECT  No results found for: HGBA1C Note Spoke with pt. Pt is obese.  Pt eats 3 meals a day; most prepared at home.  Making healthy food choices the majority of the time. Pt shared that she has changed from eating one 1 meal a day and grazing to new dietary pattern. Praised pt for making this change. Pt would like to lose weight, dicussed an eating pattern to help with weight loss, how to build a healthy plate, food journaling, food choices, how to read a nutrition label, etc. Pt's Rate Your Plate results reviewed with pt. Pt does avoid salty food; doesn't use canned/ convenience food.  Pt doesn't add salt to food.  The role of sodium in lung disease reviewed with pt. Pt expressed understanding of the information reviewed.  Nutrition Diagnosis  ? Overweight/obesity related to excessive energy intake as evidenced by a BMI of 43.59   Nutrition Intervention ? Pt's individual nutrition plan and goals reviewed with pt. ? Benefits of adopting healthy eating habits discussed when pt's Rate Your Plate reviewed.  Goal(s) 1. Pt to identify and limit food sources of sodium, saturated fat, trans fat, refined carbohydrates 2. Identify food quantities necessary to achieve wt loss of  -2# per week to a goal wt loss of 6-24 lb at graduation from  pulmonary rehab.   Plan:  Pt to attend Pulmonary Nutrition class Will provide client-centered nutrition education as part of interdisciplinary care.    Monitor and Evaluate progress toward nutrition goal with team.   Laurina Bustle, MS, RD, LDN 10/11/2017 12:10 PM

## 2017-10-13 ENCOUNTER — Encounter (HOSPITAL_COMMUNITY)
Admission: RE | Admit: 2017-10-13 | Discharge: 2017-10-13 | Disposition: A | Discharge status: Home / Self Care | Payer: BLUE CROSS/BLUE SHIELD | Source: Ambulatory Visit | Attending: Pulmonary Disease | Admitting: Pulmonary Disease

## 2017-10-13 DIAGNOSIS — J849 Interstitial pulmonary disease, unspecified: Secondary | ICD-10-CM

## 2017-10-13 NOTE — Progress Notes (Signed)
Daily Session Note  Patient Details  Name: ZYKIA WALLA MRN: [id number] Date of Birth: July 24, 1970 Referring Provider:     Pulmonary Rehab Walk Test from 08/11/2017 in [place]  Referring Provider  Dr. Rayann Heman ]      Encounter Date: 10/13/2017  Check In: Session Check In - 10/13/17 1224      Check-In   Supervising physician immediately available to respond to emergencies  [facility] immediately available    Physician(s)  Dr. Algis Liming    Location  [facility]    Staff Present  Su Hilt, MS, ACSM RCEP, Exercise Physiologist;Annedrea Rosezella [place], RN, MHA;Carlette Wilber Oliphant, RN, Roque Cash, RN    Medication changes reported      No    Fall or balance concerns reported     No    Tobacco Cessation  No Change    Warm-up and Cool-down  Performed as group-led instruction    Resistance Training Performed  Yes    VAD Patient?  No    PAD/SET Patient?  No      Pain Assessment   Currently in Pain?  No/denies    Multiple Pain Sites  No       Capillary Blood Glucose: No results found for this or any previous visit (from the past 24 hour(s)).    Social History   Tobacco Use  Smoking Status Never Smoker  Smokeless Tobacco Never Used    Goals Met:  Exercise tolerated well No report of cardiac concerns or symptoms Strength training completed today  Goals Unmet:  Not Applicable  Comments: Service time is from 1030 to 1150    Dr. Rush Farmer is Medical Director for Pulmonary Rehab at [facility]

## 2017-10-18 ENCOUNTER — Encounter (HOSPITAL_COMMUNITY)
Admission: RE | Admit: 2017-10-18 | Discharge: 2017-10-18 | Disposition: A | Discharge status: Home / Self Care | Payer: BLUE CROSS/BLUE SHIELD | Source: Ambulatory Visit | Attending: Pulmonary Disease | Admitting: Pulmonary Disease

## 2017-10-18 DIAGNOSIS — J849 Interstitial pulmonary disease, unspecified: Secondary | ICD-10-CM | POA: Diagnosis not present

## 2017-10-18 NOTE — Progress Notes (Signed)
Daily Session Note  Patient Details  Name: Tasha Ballard MRN: [id number] Date of Birth: [birth year] Referring Provider:     Pulmonary Rehab Walk Test from 08/11/2017 in [place]  Referring Provider  Dr. Rayann Heman ]      Encounter Date: 10/18/2017  Check In: Session Check In - 10/18/17 1152      Check-In   Supervising physician immediately available to respond to emergencies  [facility] immediately available    Physician(s)  Dr. Jonnie Finner     Location  [facility]    Staff Present  Su Hilt, MS, ACSM RCEP, Exercise Physiologist;Carlette Wilber Oliphant, RN, BSN;Lisa Ysidro Evert, RN;Joann Rion, RN, Deland Pretty, MS, ACSM CEP, Exercise Physiologist    Medication changes reported      No    Fall or balance concerns reported     No    Tobacco Cessation  No Change    Warm-up and Cool-down  Performed as group-led instruction    Resistance Training Performed  Yes    VAD Patient?  No    PAD/SET Patient?  No      Pain Assessment   Currently in Pain?  No/denies       Capillary Blood Glucose: No results found for this or any previous visit (from the past 24 hour(s)).    Social History   Tobacco Use  Smoking Status Never Smoker  Smokeless Tobacco Never Used    Goals Met:  Exercise tolerated well  Goals Unmet:  Not Applicable  Comments: Service time is from 10:30a to 12:15p    Dr. Rush Farmer is Medical Director for Pulmonary Rehab at [facility]

## 2017-10-20 ENCOUNTER — Encounter (HOSPITAL_COMMUNITY)
Admission: RE | Admit: 2017-10-20 | Discharge: 2017-10-20 | Disposition: A | Discharge status: Home / Self Care | Payer: BLUE CROSS/BLUE SHIELD | Source: Ambulatory Visit | Attending: Pulmonary Disease | Admitting: Pulmonary Disease

## 2017-10-20 DIAGNOSIS — J849 Interstitial pulmonary disease, unspecified: Secondary | ICD-10-CM

## 2017-10-20 NOTE — Progress Notes (Signed)
Daily Session Note  Patient Details  Name: ROSHONDA SPERL MRN: [id number] Date of Birth: 04-Jan-1971 Referring Provider:     Pulmonary Rehab Walk Test from 08/11/2017 in [place]  Referring Provider  Dr. Rayann Heman ]      Encounter Date: 10/20/2017  Check In: Session Check In - 10/20/17 1059      Check-In   Supervising physician immediately available to respond to emergencies  [facility] immediately available    Physician(s)  Dr. Florene Glen    Location  [facility]    Staff Present  Su Hilt, MS, ACSM RCEP, Exercise Physiologist;Carlette Wilber Oliphant, RN, BSN;Ramon Dredge, RN, MHA    Medication changes reported      No    Fall or balance concerns reported     No    Tobacco Cessation  No Change    Warm-up and Cool-down  Performed as group-led instruction    Resistance Training Performed  Yes    VAD Patient?  No    PAD/SET Patient?  No      Pain Assessment   Currently in Pain?  No/denies    Multiple Pain Sites  No       Capillary Blood Glucose: No results found for this or any previous visit (from the past 24 hour(s)).    Social History   Tobacco Use  Smoking Status Never Smoker  Smokeless Tobacco Never Used    Goals Met:  Exercise tolerated well  Goals Unmet:  Not Applicable  Comments: Service time is from 10:30a to 12:45p    Dr. Rush Farmer is Medical Director for Pulmonary Rehab at [facility].

## 2017-10-20 NOTE — Progress Notes (Signed)
Tasha Ballard 47 y.o. female   DOB: [birth year] MRN: [id number]          Nutrition No diagnosis found. Past Medical History:  Diagnosis Date  . Aneurysm ([place])   . Diabetes mellitus without complication ([place])   . Thyroid disease    Meds reviewed. Coreg, vit d, prednisone, armour, metformin noted  Ht: Ht Readings from Last 1 Encounters:  08/10/17 5' 2" (1.575 m)     Wt:  Wt Readings from Last 3 Encounters:  09/27/17 238 lb 5.1 oz (108.1 kg)  09/13/17 239 lb 6.7 oz (108.6 kg)  09/08/17 239 lb 10.2 oz (108.7 kg)      Current tobacco use? no Labs:  Lipid Panel  No results found for: CHOL, TRIG, HDL, CHOLHDL, VLDL, LDLCALC, LDLDIRECT  No results found for: HGBA1C Note Spoke with pt. Since last meeting pt has started eating more frequently across the day, has started keeping a food journal, has been using measuring spoons and cups to ensure accuracy of food portion size. Pt shared she has lost 7 lbs, praised pt for her hard work.  Making healthy food choices the majority of the time. Reviewed food journal with patient protein 45%, carbs 35%, fat 20%. Discussed adjusting pt macronutrient goals to 30-35% protein, carbs 40%, fat 25-30%. Encouraged pt to continue to utilize protein shakes and bars as a quick and easy option on the go, but over all to lower her percentage/amount of protein consumed across the day. Discussed decreasing portion size of protein at lunch and dinner meals. Recommended pt increase amount of healthy fat consumed at meals. Pt verbalized understanding.   Nutrition Diagnosis  ? Overweight/obesity related to excessive energy intake as evidenced by a BMI of 43.59   Nutrition Intervention ? Pt's individual nutrition plan and goals reviewed with pt. ? Benefits of adopting healthy eating habits discussed when pt's Rate Your Plate reviewed.  Goal(s) 1. Pt to identify and limit food sources of sodium, saturated fat, trans fat, refined carbohydrates 2. Identify food  quantities necessary to achieve wt loss of  -2# per week to a goal wt loss of 6-24 lb at graduation from pulmonary rehab.   Plan:  Pt to attend Pulmonary Nutrition class Will provide client-centered nutrition education as part of interdisciplinary care.    Monitor and Evaluate progress toward nutrition goal with team.   Laurina Bustle, MS, RD, LDN 10/20/2017 12:19 PM

## 2017-10-25 ENCOUNTER — Encounter (HOSPITAL_COMMUNITY)
Admission: RE | Admit: 2017-10-25 | Discharge: 2017-10-25 | Disposition: A | Discharge status: Home / Self Care | Payer: BLUE CROSS/BLUE SHIELD | Source: Ambulatory Visit | Attending: Pulmonary Disease | Admitting: Pulmonary Disease

## 2017-10-25 VITALS — Wt 228.0 lb

## 2017-10-25 DIAGNOSIS — E119 Type 2 diabetes mellitus without complications: Secondary | ICD-10-CM | POA: Insufficient documentation

## 2017-10-25 DIAGNOSIS — Z79899 Other long term (current) drug therapy: Secondary | ICD-10-CM | POA: Diagnosis not present

## 2017-10-25 DIAGNOSIS — Z7952 Long term (current) use of systemic steroids: Secondary | ICD-10-CM | POA: Insufficient documentation

## 2017-10-25 DIAGNOSIS — Z7901 Long term (current) use of anticoagulants: Secondary | ICD-10-CM | POA: Diagnosis not present

## 2017-10-25 DIAGNOSIS — J849 Interstitial pulmonary disease, unspecified: Secondary | ICD-10-CM

## 2017-10-25 NOTE — Progress Notes (Signed)
Daily Session Note  Patient Details  Name: Tasha Ballard MRN: [id number] Date of Birth: 1970/03/10 Referring Provider:     Pulmonary Rehab Walk Test from 08/11/2017 in [place]  Referring Provider  Dr. Rayann Heman ]      Encounter Date: 10/25/2017  Check In: Session Check In - 10/25/17 1025      Check-In   Supervising physician immediately available to respond to emergencies  [facility] immediately available    Physician(s)  Dr. Karleen Hampshire    Location  [facility]    Staff Present  Su Hilt, MS, ACSM RCEP, Exercise Physiologist;Carlette Wilber Oliphant, RN, BSN;Ramon Dredge, RN, MHA;Acel Natzke Ysidro Evert, RN    Medication changes reported      No    Fall or balance concerns reported     No    Tobacco Cessation  No Change    Warm-up and Cool-down  Performed as group-led instruction    Resistance Training Performed  Yes    VAD Patient?  No    PAD/SET Patient?  No      Pain Assessment   Currently in Pain?  No/denies    Multiple Pain Sites  No       Capillary Blood Glucose: No results found for this or any previous visit (from the past 24 hour(s)). POCT Glucose - 10/25/17 1223      POCT Blood Glucose   Pre-Exercise  122 mg/dL    Post-Exercise  129 mg/dL      Exercise Prescription Changes - 10/25/17 1200      Response to Exercise   Blood Pressure (Admit)  108/68    Blood Pressure (Exercise)  100/72    Blood Pressure (Exit)  130/89    Heart Rate (Admit)  100 bpm    Heart Rate (Exercise)  122 bpm    Heart Rate (Exit)  103 bpm    Oxygen Saturation (Admit)  98 %    Oxygen Saturation (Exercise)  98 %    Oxygen Saturation (Exit)  100 %    Rating of Perceived Exertion (Exercise)  12    Perceived Dyspnea (Exercise)  1.5    Duration  Progress to 45 minutes of aerobic exercise without signs/symptoms of physical distress    Intensity  THRR unchanged      Progression   Progression  Continue to progress workloads to  maintain intensity without signs/symptoms of physical distress.      Resistance Training   Training Prescription  Yes    Weight  blue bands    Reps  10-15    Time  10 Minutes      Interval Training   Interval Training  No      Oxygen   Oxygen  Continuous    Liters  15      Treadmill   MPH  1.5    Grade  0    Minutes  6      NuStep   Level  2    SPM  80    Minutes  28    METs  1.2      Arm Ergometer   Level  2    Minutes  17       Social History   Tobacco Use  Smoking Status Never Smoker  Smokeless Tobacco Never Used    Goals Met:  Exercise tolerated well No report of cardiac concerns or symptoms Strength training completed today  Goals Unmet:  Not Applicable  Comments:  Service time is from 1030 to 1215    Dr. Rush Farmer is Medical Director for Pulmonary Rehab at [facility].

## 2017-10-26 NOTE — Progress Notes (Signed)
I have reviewed a Home Exercise Prescription with Tasha Ballard . Skyra is not currently exercising at home.  The patient was advised to walk 2 days a week for 20 minutes.  Karinna and I discussed how to progress their exercise prescription.  The patient stated that their goals were being able to drive again and potentially qualify for transplant.  The patient stated that they understand the exercise prescription.  We reviewed exercise guidelines, target heart rate during exercise, RPE Scale, weather conditions, NTG use, endpoints for exercise, warmup and cool down.  Patient is encouraged to come to me with any questions. I will continue to follow up with the patient to assist them with progression and safety.

## 2017-10-27 ENCOUNTER — Encounter (HOSPITAL_COMMUNITY)
Admission: RE | Admit: 2017-10-27 | Discharge: 2017-10-27 | Disposition: A | Discharge status: Home / Self Care | Payer: BLUE CROSS/BLUE SHIELD | Source: Ambulatory Visit | Attending: Pulmonary Disease | Admitting: Pulmonary Disease

## 2017-10-27 DIAGNOSIS — J849 Interstitial pulmonary disease, unspecified: Secondary | ICD-10-CM

## 2017-10-27 NOTE — Progress Notes (Signed)
Daily Session Note  Patient Details  Name: Tasha Ballard MRN: [id number] Date of Birth: [birth year] Referring Provider:     Pulmonary Rehab Walk Test from 08/11/2017 in [place]  Referring Provider  Dr. Rayann Heman ]      Encounter Date: 10/27/2017  Check In: Session Check In - 10/27/17 1138      Check-In   Supervising physician immediately available to respond to emergencies  [facility] immediately available    Physician(s)  Dr. Darrick Meigs     Location  [facility]    Staff Present  Rodney Langton, RN;Carlette Wilber Oliphant, RN, BSN;Joann Rion, RN, BSN;Ramon Dredge, RN, MHA    Medication changes reported      No    Fall or balance concerns reported     No    Tobacco Cessation  No Change    Warm-up and Cool-down  Performed as group-led instruction    Resistance Training Performed  Yes    VAD Patient?  No    PAD/SET Patient?  No      Pain Assessment   Currently in Pain?  No/denies       Capillary Blood Glucose: No results found for this or any previous visit (from the past 24 hour(s)).    Social History   Tobacco Use  Smoking Status Never Smoker  Smokeless Tobacco Never Used    Goals Met:  Exercise tolerated well No report of cardiac concerns or symptoms Strength training completed today  Goals Unmet:  Not Applicable  Comments: Service time is from 1030 to 1150    Dr. Rush Farmer is Medical Director for Pulmonary Rehab at [facility].

## 2017-11-01 ENCOUNTER — Encounter (HOSPITAL_COMMUNITY)
Admission: RE | Admit: 2017-11-01 | Discharge: 2017-11-01 | Disposition: A | Discharge status: Home / Self Care | Payer: BLUE CROSS/BLUE SHIELD | Source: Ambulatory Visit | Attending: Pulmonary Disease | Admitting: Pulmonary Disease

## 2017-11-01 VITALS — Wt 227.5 lb

## 2017-11-01 DIAGNOSIS — J849 Interstitial pulmonary disease, unspecified: Secondary | ICD-10-CM

## 2017-11-01 NOTE — Progress Notes (Signed)
Daily Session Note  Patient Details  Name: Tasha Ballard MRN: [id number] Date of Birth: 12/24/1970 Referring Provider:     Pulmonary Rehab Walk Test from 08/11/2017 in [facility]  Referring Provider  Dr. Yacoub [[facility] ]      Encounter Date: 11/01/2017  Check In: Session Check In - 11/01/17 1046      Check-In   Supervising physician immediately available to respond to emergencies  [facility] immediately available    Physician(s)  Dr. Lama     Location  [facility]    Staff Present   , RN;Carlette Carlton, RN, BSN;Joann Rion, RN, BSN;Annedrea Stackhouse, RN, MHA;Molly DiVincenzo, MS, ACSM RCEP, Exercise Physiologist    Medication changes reported      No    Fall or balance concerns reported     No    Tobacco Cessation  No Change    Warm-up and Cool-down  Performed as group-led instruction    Resistance Training Performed  Yes    VAD Patient?  No    PAD/SET Patient?  No      Pain Assessment   Currently in Pain?  No/denies    Multiple Pain Sites  No       Capillary Blood Glucose: No results found for this or any previous visit (from the past 24 hour(s)).    Social History   Tobacco Use  Smoking Status Never Smoker  Smokeless Tobacco Never Used    Goals Met:  Exercise tolerated well No report of cardiac concerns or symptoms Strength training completed today  Goals Unmet:  Not Applicable  Comments: Service time is from 1030 to 1220    Dr. Wesam G. Yacoub is Medical Director for Pulmonary Rehab at [facility]. 

## 2017-11-03 ENCOUNTER — Encounter (HOSPITAL_COMMUNITY): Payer: BLUE CROSS/BLUE SHIELD

## 2017-11-08 ENCOUNTER — Encounter (HOSPITAL_COMMUNITY)
Admission: RE | Admit: 2017-11-08 | Discharge: 2017-11-08 | Disposition: A | Discharge status: Home / Self Care | Payer: BLUE CROSS/BLUE SHIELD | Source: Ambulatory Visit

## 2017-11-08 LAB — PULMONARY FUNCTION TEST

## 2017-11-09 NOTE — Progress Notes (Signed)
Pulmonary Individual Treatment Plan  Patient Details  Name: Tasha Ballard MRN: [id number] Date of Birth: [birth year] Referring Provider:     Pulmonary Rehab Walk Test from 08/11/2017 in [facility]  Referring Provider  Dr. Rayann Heman ]      Initial Encounter Date:    Pulmonary Rehab Walk Test from 08/11/2017 in [facility]  Date  08/12/17      Visit Diagnosis: ILD (interstitial lung disease) ([place])  Patient's Home Medications on Admission:   Current Outpatient Medications:  .  acyclovir (ZOVIRAX) 400 MG tablet, Take 400 mg by mouth 3 (three) times daily., Disp: , Rfl:  .  apixaban (ELIQUIS) 5 MG TABS tablet, Take 5 mg by mouth 2 (two) times daily., Disp: , Rfl:  .  benzonatate (TESSALON) 100 MG capsule, Take by mouth 3 (three) times daily as needed for cough., Disp: , Rfl:  .  carvedilol (COREG) 6.25 MG tablet, Take 6.25 mg by mouth 2 (two) times daily with a meal., Disp: , Rfl:  .  cholecalciferol (VITAMIN D) 400 units TABS tablet, Take 800 Units by mouth. Pt takes 1,000 units daily, Disp: , Rfl:  .  furosemide (LASIX) 40 MG tablet, Take 40 mg by mouth daily. 40 mg daily but on days of swelling twice a day, Disp: , Rfl:  .  metFORMIN (GLUCOPHAGE) 500 MG tablet, Take by mouth 2 (two) times daily with a meal., Disp: , Rfl:  .  montelukast (SINGULAIR) 10 MG tablet, Take 10 mg by mouth daily after supper., Disp: , Rfl:  .  mycophenolate (CELLCEPT) 500 MG tablet, Take 1,500 mg by mouth 2 (two) times daily. Takes 3 500 mg twice a day, Disp: , Rfl:  .  predniSONE (DELTASONE) 10 MG tablet, Take 10 mg by mouth daily with breakfast., Disp: , Rfl:  .  spironolactone (ALDACTONE) 100 MG tablet, Take 100 mg by mouth daily., Disp: , Rfl:  .  Sulfamethoxazole-Trimethoprim (SEPTRA DS PO), Take by mouth daily. 400-80  Once a day while on prednisone, Disp: , Rfl:  .  thyroid (ARMOUR) 30 MG tablet, Take 60 mg by mouth daily before  breakfast. Takes 60 mg tablet, Disp: , Rfl:   Past Medical History: Past Medical History:  Diagnosis Date  . Aneurysm ([place])   . Diabetes mellitus without complication ([place])   . Thyroid disease     Tobacco Use: Social History   Tobacco Use  Smoking Status Never Smoker  Smokeless Tobacco Never Used    Labs: Recent Review Flowsheet Data    There is no flowsheet data to display.      Capillary Blood Glucose: Lab Results  Component Value Date   GLUCAP 162 (H) 09/01/2017   POCT Glucose    Row Name 08/30/17 1246 09/13/17 1255 09/27/17 1231 10/11/17 1252 10/25/17 1223     POCT Blood Glucose   Pre-Exercise  137 mg/dL  191 mg/dL  147 mg/dL  176 mg/dL  122 mg/dL   Post-Exercise  -  219 mg/dL  176 mg/dL  179 mg/dL  129 mg/dL   Row Name 11/08/17 1326             POCT Blood Glucose   Pre-Exercise  135 mg/dL       Post-Exercise  135 mg/dL          Pulmonary Assessment Scores: Pulmonary Assessment Scores    Row Name 08/12/17 [place]  ADL Phase  Entry       mMRC Score   mMRC Score  3        Pulmonary Function Assessment:   Exercise Target Goals: Exercise Program Goal: Individual exercise prescription set using results from initial 6 min walk test and THRR while considering  patient's activity barriers and safety.   Exercise Prescription Goal: Initial exercise prescription builds to 30-45 minutes a day of aerobic activity, 2-3 days per week.  Home exercise guidelines will be given to patient during program as part of exercise prescription that the participant will acknowledge.  Activity Barriers & Risk Stratification: Activity Barriers & Cardiac Risk Stratification - 08/10/17 1442      Activity Barriers & Cardiac Risk Stratification   Activity Barriers  Deconditioning;Shortness of Breath       6 Minute Walk: 6 Minute Walk    Row Name 08/12/17 1047         6 Minute Walk   Phase  Initial     Distance  800 feet     Walk Time  - 5  minutes 40 seconds     # of Rest Breaks  1 1 rest break initiated by EP due to low oxygen saturation     MPH  1.5     METS  2.15     RPE  13     Perceived Dyspnea   2     Symptoms  No     Resting HR  114 bpm     Resting BP  148/81     Resting Oxygen Saturation   97 %     Exercise Oxygen Saturation  during 6 min walk  80 %     Max Ex. HR  146 bpm     Max Ex. BP  136/81       Interval HR   1 Minute HR  118     2 Minute HR  129     3 Minute HR  135     4 Minute HR  135     5 Minute HR  146     6 Minute HR  145     Interval Heart Rate?  Yes       Interval Oxygen   Interval Oxygen?  Yes     Baseline Oxygen Saturation %  97 %     1 Minute Oxygen Saturation %  92 %     1 Minute Liters of Oxygen  8 L     2 Minute Oxygen Saturation %  80 %     2 Minute Liters of Oxygen  8 L     3 Minute Oxygen Saturation %  88 %     3 Minute Liters of Oxygen  10 L     4 Minute Oxygen Saturation %  86 %     4 Minute Liters of Oxygen  10 L     5 Minute Oxygen Saturation %  91 %     5 Minute Liters of Oxygen  15 L     6 Minute Oxygen Saturation %  91 %     6 Minute Liters of Oxygen  15 L        Oxygen Initial Assessment: Oxygen Initial Assessment - 08/12/17 1046      Initial 6 min Walk   Oxygen Used  Continuous;E-Tanks    Liters per minute  15   titrated from 8-15     Program Oxygen Prescription  Program Oxygen Prescription  Continuous;E-Tanks    Liters per minute  15       Oxygen Re-Evaluation: Oxygen Re-Evaluation    Row Name 08/19/17 1045 09/12/17 0912 10/10/17 0912 11/07/17 1642       Program Oxygen Prescription   Program Oxygen Prescription  Continuous;E-Tanks  Continuous;E-Tanks  Continuous;E-Tanks  Continuous;E-Tanks    Liters per minute  _0 Comments  Pt wears 6 liters at rest (nasal cannula) and 10 liters on exertion  Pt wears 6 liters at rest (nasal cannula) and 10 liters on exertion  Pt wears 6 liters at rest (nasal cannula) and 10 liters on exertion  Pt  wears 6 liters at rest (nasal cannula) and 15 liters on exertion      Home Oxygen   Home Oxygen Device  Portable Concentrator;Home Concentrator  Portable Concentrator;Home Concentrator  -  Portable Concentrator;Home Concentrator    Sleep Oxygen Prescription  Continuous  Continuous  Continuous  Continuous    Liters per minute  _1 Home Exercise Oxygen Prescription  Continuous  Continuous  Continuous  Continuous    Liters per minute  _2 Home at Rest Exercise Oxygen Prescription  Continuous  Continuous  Continuous  Continuous    Liters per minute  _3 Compliance with Home Oxygen Use  Yes  Yes  Yes  Yes      Goals/Expected Outcomes   Short Term Goals  To learn and exhibit compliance with exercise, home and travel O2 prescription;To learn and understand importance of monitoring SPO2 with pulse oximeter and demonstrate accurate use of the pulse oximeter.;To learn and understand importance of maintaining oxygen saturations>88%;To learn and demonstrate proper pursed lip breathing techniques or other breathing techniques.;To learn and demonstrate proper use of respiratory medications  To learn and exhibit compliance with exercise, home and travel O2 prescription;To learn and understand importance of monitoring SPO2 with pulse oximeter and demonstrate accurate use of the pulse oximeter.;To learn and understand importance of maintaining oxygen saturations>88%;To learn and demonstrate proper pursed lip breathing techniques or other breathing techniques.;To learn and demonstrate proper use of respiratory medications  To learn and exhibit compliance with exercise, home and travel O2 prescription;To learn and understand importance of monitoring SPO2 with pulse oximeter and demonstrate accurate use of the pulse oximeter.;To learn and understand importance of maintaining oxygen saturations>88%;To learn and demonstrate proper pursed lip breathing techniques or other breathing  techniques.;To learn and demonstrate proper use of respiratory medications  To learn and exhibit compliance with exercise, home and travel O2 prescription;To learn and understand importance of monitoring SPO2 with pulse oximeter and demonstrate accurate use of the pulse oximeter.;To learn and understand importance of maintaining oxygen saturations>88%;To learn and demonstrate proper pursed lip breathing techniques or other breathing techniques.;To learn and demonstrate proper use of respiratory medications    Long  Term Goals  Exhibits compliance with exercise, home and travel O2 prescription;Verbalizes importance of monitoring SPO2 with pulse oximeter and return demonstration;Maintenance of O2 saturations>88%;Exhibits proper breathing techniques, such as pursed lip breathing or other method taught during program session;Compliance with respiratory medication;Demonstrates proper use of MDI's  Exhibits compliance with exercise, home and travel O2 prescription;Verbalizes importance of monitoring SPO2 with pulse oximeter and return demonstration;Maintenance of O2 saturations>88%;Exhibits proper breathing techniques, such as pursed lip breathing or other method taught during  program session;Compliance with respiratory medication;Demonstrates proper use of MDI's  Exhibits compliance with exercise, home and travel O2 prescription;Verbalizes importance of monitoring SPO2 with pulse oximeter and return demonstration;Maintenance of O2 saturations>88%;Exhibits proper breathing techniques, such as pursed lip breathing or other method taught during program session;Compliance with respiratory medication;Demonstrates proper use of MDI's  Exhibits compliance with exercise, home and travel O2 prescription;Verbalizes importance of monitoring SPO2 with pulse oximeter and return demonstration;Maintenance of O2 saturations>88%;Exhibits proper breathing techniques, such as pursed lip breathing or other method taught during  program session;Compliance with respiratory medication;Demonstrates proper use of MDI's    Goals/Expected Outcomes  -  -  Through exercise at rehab and at home, the patient will decrease shortness of breath with daily activities and feel confident in carrying out an exercise regime at home.   Compliance       Oxygen Discharge (Final Oxygen Re-Evaluation): Oxygen Re-Evaluation - 11/07/17 1642      Program Oxygen Prescription   Program Oxygen Prescription  Continuous;E-Tanks    Liters per minute  15    Comments  Pt wears 6 liters at rest (nasal cannula) and 15 liters on exertion      Home Oxygen   Home Oxygen Device  Portable Concentrator;Home Concentrator    Sleep Oxygen Prescription  Continuous    Liters per minute  6    Home Exercise Oxygen Prescription  Continuous    Liters per minute  15    Home at Rest Exercise Oxygen Prescription  Continuous    Liters per minute  10    Compliance with Home Oxygen Use  Yes      Goals/Expected Outcomes   Short Term Goals  To learn and exhibit compliance with exercise, home and travel O2 prescription;To learn and understand importance of monitoring SPO2 with pulse oximeter and demonstrate accurate use of the pulse oximeter.;To learn and understand importance of maintaining oxygen saturations>88%;To learn and demonstrate proper pursed lip breathing techniques or other breathing techniques.;To learn and demonstrate proper use of respiratory medications    Long  Term Goals  Exhibits compliance with exercise, home and travel O2 prescription;Verbalizes importance of monitoring SPO2 with pulse oximeter and return demonstration;Maintenance of O2 saturations>88%;Exhibits proper breathing techniques, such as pursed lip breathing or other method taught during program session;Compliance with respiratory medication;Demonstrates proper use of MDI's    Goals/Expected Outcomes  Compliance       Initial Exercise Prescription: Initial Exercise Prescription -  08/12/17 1000      Date of Initial Exercise RX and Referring Provider   Date  08/12/17    Referring Provider  Dr. Rayann Heman      Oxygen   Oxygen  Continuous    Liters  15      NuStep   Level  2    SPM  80    Minutes  17    METs  1.5      Arm Ergometer   Level  2    Watts  10    Minutes  17      Track   Laps  8    Minutes  17      Prescription Details   Frequency (times per week)  2    Duration  Progress to 45 minutes of aerobic exercise without signs/symptoms of physical distress      Intensity   THRR 40-80% of Max Heartrate  69-138    Ratings of Perceived Exertion  11-13    Perceived Dyspnea  0-4      Progression   Progression  Continue progressive overload as per policy without signs/symptoms or physical distress.      Resistance Training   Training Prescription  Yes    Weight  blue bands    Reps  10-15       Perform Capillary Blood Glucose checks as needed.  Exercise Prescription Changes: Exercise Prescription Changes    Row Name 08/30/17 1200 09/13/17 1200 09/27/17 1200 10/11/17 1200 10/25/17 1200     Response to Exercise   Blood Pressure (Admit)  118/70  112/70  124/81  110/78  108/68   Blood Pressure (Exercise)  124/70  108/78  118/60  120/74  100/72   Blood Pressure (Exit)  118/76  110/70  120/60  114/70  130/89   Heart Rate (Admit)  102 bpm  104 bpm  107 bpm  94 bpm  100 bpm   Heart Rate (Exercise)  118 bpm  114 bpm  123 bpm  110 bpm  122 bpm   Heart Rate (Exit)  108 bpm  105 bpm  99 bpm  99 bpm  103 bpm   Oxygen Saturation (Admit)  98 %  97 %  98 %  100 %  98 %   Oxygen Saturation (Exercise)  95 %  94 %  91 %  100 %  98 %   Oxygen Saturation (Exit)  99 %  99 %  99 %  100 %  100 %   Rating of Perceived Exertion (Exercise)  _0 Perceived Dyspnea (Exercise)  _1 1.5   Duration  Progress to 45 minutes of aerobic exercise without signs/symptoms of physical distress  Progress to 45 minutes of aerobic exercise without  signs/symptoms of physical distress  Progress to 45 minutes of aerobic exercise without signs/symptoms of physical distress  Progress to 45 minutes of aerobic exercise without signs/symptoms of physical distress  Progress to 45 minutes of aerobic exercise without signs/symptoms of physical distress   Intensity  - 40-80% HRR  THRR unchanged  THRR unchanged  THRR unchanged  THRR unchanged     Progression   Progression  Continue to progress workloads to maintain intensity without signs/symptoms of physical distress.  Continue to progress workloads to maintain intensity without signs/symptoms of physical distress.  Continue to progress workloads to maintain intensity without signs/symptoms of physical distress.  Continue to progress workloads to maintain intensity without signs/symptoms of physical distress.  Continue to progress workloads to maintain intensity without signs/symptoms of physical distress.     Resistance Training   Training Prescription  Yes  Yes  Yes  Yes  Yes   Weight  blue bands  blue bands  blue bands  blue bands  blue bands   Reps  10-15  10-15  10-15  10-15  10-15   Time  10 Minutes  10 Minutes  10 Minutes  10 Minutes  10 Minutes     Interval Training   Interval Training  No  No  No  No  No     Oxygen   Oxygen  Continuous  Continuous  Continuous  Continuous  Continuous   Liters  _2 Treadmill   MPH  -  -  -  -  1.5   Grade  -  -  -  -  0  Minutes  -  -  -  -  6     NuStep   Level  _0 SPM  80  80  80  80  80   Minutes  _1 METs  -  1.4  1.4  1.4  1.2     Arm Ergometer   Level  2  2  -  2  2   Watts  10  10  -  -  -   Minutes  17  17  -  17  17     Track   Laps  _2 -  -   Minutes  _3 -  -     Home Exercise Plan   Plans to continue exercise at  -  -  -  -  Home (comment)   Frequency  -  -  -  -  Add 2 additional days to program exercise sessions.   [place] Name 11/01/17 1324              Response to Exercise   Blood Pressure (Admit)  118/70       Blood Pressure (Exercise)  130/60       Blood Pressure (Exit)  110/70       Heart Rate (Admit)  93 bpm       Heart Rate (Exercise)  112 bpm       Heart Rate (Exit)  75 bpm       Oxygen Saturation (Admit)  100 %       Oxygen Saturation (Exercise)  97 %       Oxygen Saturation (Exit)  95 %       Rating of Perceived Exertion (Exercise)  11       Perceived Dyspnea (Exercise)  2       Duration  Progress to 45 minutes of aerobic exercise without signs/symptoms of physical distress       Intensity  THRR unchanged         Progression   Progression  Continue to progress workloads to maintain intensity without signs/symptoms of physical distress.         Resistance Training   Training Prescription  Yes       Weight  blue bands       Reps  10-15       Time  10 Minutes         Interval Training   Interval Training  No         Oxygen   Oxygen  Continuous       Liters  15         Treadmill   MPH  1.5       Grade  0       Minutes  17         NuStep   Level  2       SPM  80       Minutes  17       METs  1.4          Exercise Comments: Exercise Comments    Row Name 10/26/17 1515           Exercise Comments  Home exercise completed          Exercise Goals and Review: Exercise Goals  [place] Name 08/10/17 1444             Exercise Goals   Increase Physical Activity  Yes       Intervention  Provide advice, education, support and counseling about physical activity/exercise needs.;Develop an individualized exercise prescription for aerobic and resistive training based on initial evaluation findings, risk stratification, comorbidities and participant's personal goals.       Expected Outcomes  Short Term: Attend rehab on a regular basis to increase amount of physical activity.;Long Term: Add in home exercise to make exercise part of routine and to increase amount of physical activity.;Long Term: Exercising regularly  at least 3-5 days a week.       Increase Strength and Stamina  Yes       Intervention  Provide advice, education, support and counseling about physical activity/exercise needs.;Develop an individualized exercise prescription for aerobic and resistive training based on initial evaluation findings, risk stratification, comorbidities and participant's personal goals.       Expected Outcomes  Short Term: Increase workloads from initial exercise prescription for resistance, speed, and METs.;Short Term: Perform resistance training exercises routinely during rehab and add in resistance training at home;Long Term: Improve cardiorespiratory fitness, muscular endurance and strength as measured by increased METs and functional capacity (6MWT)       Able to understand and use rate of perceived exertion (RPE) scale  Yes       Intervention  Provide education and explanation on how to use RPE scale       Expected Outcomes  Short Term: Able to use RPE daily in rehab to express subjective intensity level;Long Term:  Able to use RPE to guide intensity level when exercising independently       Able to understand and use Dyspnea scale  Yes       Intervention  Provide education and explanation on how to use Dyspnea scale       Expected Outcomes  Short Term: Able to use Dyspnea scale daily in rehab to express subjective sense of shortness of breath during exertion;Long Term: Able to use Dyspnea scale to guide intensity level when exercising independently       Knowledge and understanding of Target Heart Rate Range (THRR)  Yes       Intervention  Provide education and explanation of THRR including how the numbers were predicted and where they are located for reference       Expected Outcomes  Short Term: Able to state/look up THRR;Long Term: Able to use THRR to govern intensity when exercising independently;Short Term: Able to use daily as guideline for intensity in rehab       Understanding of Exercise Prescription  Yes        Intervention  Provide education, explanation, and written materials on patient's individual exercise prescription       Expected Outcomes  Short Term: Able to explain program exercise prescription;Long Term: Able to explain home exercise prescription to exercise independently          Exercise Goals Re-Evaluation : Exercise Goals Re-Evaluation    Row Name 08/19/17 1046 09/12/17 0913 10/10/17 0914 11/07/17 1643       Exercise Goal Re-Evaluation   Exercise Goals Review  Increase Physical Activity;Able to understand and use rate of perceived exertion (RPE) scale;Knowledge and understanding of Target Heart Rate Range (THRR);Understanding of Exercise Prescription;Increase Strength and Stamina;Able to understand and use Dyspnea scale;Able to check pulse independently  Increase Physical Activity;Able to understand and use rate of  perceived exertion (RPE) scale;Knowledge and understanding of Target Heart Rate Range (THRR);Understanding of Exercise Prescription;Increase Strength and Stamina;Able to understand and use Dyspnea scale  Increase Physical Activity;Able to understand and use rate of perceived exertion (RPE) scale;Knowledge and understanding of Target Heart Rate Range (THRR);Understanding of Exercise Prescription;Increase Strength and Stamina;Able to understand and use Dyspnea scale  Increase Physical Activity;Able to understand and use rate of perceived exertion (RPE) scale;Knowledge and understanding of Target Heart Rate Range (THRR);Understanding of Exercise Prescription;Increase Strength and Stamina;Able to understand and use Dyspnea scale    Comments  Patient's first day of exercise will be 08/23/17  Patient has only attended 4 rehab sessions. Will cont. to monitor and progress as able.   Patient's progress is variable. Some days are better than others. She is able to walk on average 7-8 laps (200 ft each) in 15 minutes. Barriers have included sinus issues, high flow oxygen needs. Will cont to  monitor and progress as able.   Patient's progress is variable. Some days are better than others. She is able to walk on average 7-8 laps (200 ft each) in 15 minutes. Barriers have included sinus issues, high flow oxygen needs. Is out of town at this point in search of second opinions. Will cont to monitor and progress as able.     Expected Outcomes  Through exercise at rehab and at home, the patient will decrease shortness of breath with daily activities and feel confident in carrying out an exercise regime at home.   Through exercise at rehab and at home, the patient will decrease shortness of breath with daily activities and feel confident in carrying out an exercise regime at home.   Through exercise at rehab and at home, the patient will decrease shortness of breath with daily activities and feel confident in carrying out an exercise regime at home.   Through exercise at rehab and at home, the patient will decrease shortness of breath with daily activities and feel confident in carrying out an exercise regime at home.        Discharge Exercise Prescription (Final Exercise Prescription Changes): Exercise Prescription Changes - 11/01/17 1324      Response to Exercise   Blood Pressure (Admit)  118/70    Blood Pressure (Exercise)  130/60    Blood Pressure (Exit)  110/70    Heart Rate (Admit)  93 bpm    Heart Rate (Exercise)  112 bpm    Heart Rate (Exit)  75 bpm    Oxygen Saturation (Admit)  100 %    Oxygen Saturation (Exercise)  97 %    Oxygen Saturation (Exit)  95 %    Rating of Perceived Exertion (Exercise)  11    Perceived Dyspnea (Exercise)  2    Duration  Progress to 45 minutes of aerobic exercise without signs/symptoms of physical distress    Intensity  THRR unchanged      Progression   Progression  Continue to progress workloads to maintain intensity without signs/symptoms of physical distress.      Resistance Training   Training Prescription  Yes    Weight  blue bands    Reps   10-15    Time  10 Minutes      Interval Training   Interval Training  No      Oxygen   Oxygen  Continuous    Liters  15      Treadmill   MPH  1.5    Grade  0  Minutes  17      NuStep   Level  2    SPM  80    Minutes  17    METs  1.4       Nutrition:  Target Goals: Understanding of nutrition guidelines, daily intake of sodium <1511m, cholesterol <2037m calories 30% from fat and 7% or less from saturated fats, daily to have 5 or more servings of fruits and vegetables.  Biometrics:    Nutrition Therapy Plan and Nutrition Goals: Nutrition Therapy & Goals - 10/11/17 1214      Nutrition Therapy   Diet  general healthful      Personal Nutrition Goals   Nutrition Goal  Identify food quantities necessary to achieve wt loss of  -2# per week to a goal wt loss of 6-24 lb at graduation from pulmonary rehab.      Intervention Plan   Intervention  Prescribe, educate and counsel regarding individualized specific dietary modifications aiming towards targeted core components such as weight, hypertension, lipid management, diabetes, heart failure and other comorbidities.    Expected Outcomes  Short Term Goal: Understand basic principles of dietary content, such as calories, fat, sodium, cholesterol and nutrients.       Nutrition Assessments: Nutrition Assessments - 10/11/17 1214      Rate Your Plate Scores   Pre Score  56       Nutrition Goals Re-Evaluation:   Nutrition Goals Discharge (Final Nutrition Goals Re-Evaluation):   Psychosocial: Target Goals: Acknowledge presence or absence of significant depression and/or stress, maximize coping skills, provide positive support system. Participant is able to verbalize types and ability to use techniques and skills needed for reducing stress and depression.  Initial Review & Psychosocial Screening: Initial Psych Review & Screening - 08/10/17 1439      Initial Review   Current issues with  Current Stress Concerns     Source of Stress Concerns  Transportation;Occupation      Family Dynamics   [place] Yes      Barriers   Psychosocial barriers to participate in program  The patient should benefit from training in stress management and relaxation.      Screening Interventions   Interventions  Encouraged to exercise    Expected Outcomes  Long Term goal: The participant improves quality of Life and PHQ9 Scores as seen by post scores and/or verbalization of changes;Long Term Goal: Stressors or current issues are controlled or eliminated.       Quality of Life Scores:  Scores of 19 and below usually indicate a poorer quality of life in these areas.  A difference of  2-3 points is a clinically meaningful difference.  A difference of 2-3 points in the total score of the Quality of Life Index has been associated with significant improvement in overall quality of life, self-image, physical symptoms, and general health in studies assessing change in quality of life.  PHQ-9: Recent Review Flowsheet Data    [facility]   Decreased Interest 3   Down, Depressed, Hopeless 1   PHQ - 2 Score 4   Altered sleeping 3   Tired, decreased energy 3   Change in appetite 3   Feeling bad or failure about yourself  3   Trouble concentrating 1   Moving slowly or fidgety/restless 0   Suicidal thoughts 0   PHQ-9 Score 17   Difficult doing work/chores Very difficult     Interpretation of Total Score  Total Score  Depression Severity:  1-4 = Minimal depression, 5-9 = Mild depression, 10-14 = Moderate depression, 15-19 = Moderately severe depression, 20-27 = Severe depression   Psychosocial Evaluation and Intervention: Psychosocial Evaluation - 11/09/17 1643      Psychosocial Evaluation & Interventions   Interventions  Encouraged to exercise with the program and follow exercise prescription;Relaxation education;Stress management education    Comments  Pt is seeking a second opinon with  pulmonary doctor in [place] where they are doing clinical trial for ILD.  Pt has a hopeful outlook and is encouraged that she has lost some weight.     Expected Outcomes  Continue to display hopeful and positive and realistic outlook on life    Continue Psychosocial Services   Follow up required by staff       Psychosocial Re-Evaluation: Psychosocial Re-Evaluation    [facility] Name 09/12/17 1514 10/11/17 1012 11/09/17 1644         Psychosocial Re-Evaluation   Current issues with  Current Stress Concerns;Current Anxiety/Panic  Current Stress Concerns;Current Anxiety/Panic  Current Stress Concerns;Current Anxiety/Panic     Comments  Pt is not driving and does not feel independent.  this is a source of her stress and anxiety.  Pt is uncertain of what her future will look like.    Pt is not driving and does not feel independent.  this is a source of her stress and anxiety.  Pt is uncertain of what her future will look like.    Pt is not driving and does not feel independent.  this is a source of her stress and anxiety.  Pt is uncertain of what her future will look like but is more hopeful than she has been in a long time     Interventions  Stress management education;Encouraged to attend Pulmonary Rehabilitation for the exercise;Relaxation education  Stress management education;Encouraged to attend Pulmonary Rehabilitation for the exercise;Relaxation education  Stress management education;Encouraged to attend Pulmonary Rehabilitation for the exercise;Relaxation education       Initial Review   Source of Stress Concerns  Transportation;Occupation  Transport planner;Occupation  -        Psychosocial Discharge (Final Psychosocial Re-Evaluation): Psychosocial Re-Evaluation - 11/09/17 1644      Psychosocial Re-Evaluation   Current issues with  Current Stress Concerns;Current Anxiety/Panic    Comments  Pt is not driving and does not feel independent.  this is a source of her stress and anxiety.  Pt is  uncertain of what her future will look like but is more hopeful than she has been in a long time    Interventions  Stress management education;Encouraged to attend Pulmonary Rehabilitation for the exercise;Relaxation education       Education: Education Goals: Education classes will be provided on a weekly basis, covering required topics. Participant will state understanding/return demonstration of topics presented.  Learning Barriers/Preferences: Learning Barriers/Preferences - 08/10/17 1441      Learning Barriers/Preferences   Learning Barriers  Sight    Learning Preferences  Computer/Internet;Group Instruction;Individual Instruction;Written Material       Education Topics: Risk Factor Reduction:  -Group instruction that is supported by a PowerPoint presentation. Instructor discusses the definition of a risk factor, different risk factors for pulmonary disease, and how the heart and lungs work together.     Nutrition for Pulmonary Patient:  -Group instruction provided by PowerPoint slides, verbal discussion, and written materials to support subject matter. The instructor gives an explanation and review of healthy diet recommendations, which includes a discussion on weight  management, recommendations for fruit and vegetable consumption, as well as protein, fluid, caffeine, fiber, sodium, sugar, and alcohol. Tips for eating when patients are short of breath are discussed.   PULMONARY REHAB OTHER RESPIRATORY from 11/01/2017 in [place]  Date  10/20/17  Educator  Rodman Pickle  Instruction Review Code  2- Demonstrated Understanding      Pursed Lip Breathing:  -Group instruction that is supported by demonstration and informational handouts. Instructor discusses the benefits of pursed lip and diaphragmatic breathing and detailed demonstration on how to preform both.     Oxygen Safety:  -Group instruction provided by PowerPoint, verbal discussion, and written  material to support subject matter. There is an overview of "What is Oxygen" and "Why do we need it".  Instructor also reviews how to create a safe environment for oxygen use, the importance of using oxygen as prescribed, and the risks of noncompliance. There is a brief discussion on traveling with oxygen and resources the patient may utilize.   PULMONARY REHAB OTHER RESPIRATORY from 11/01/2017 in [place]  Date  09/08/17  Educator  Cloyde Reams  Instruction Review Code  1- Verbalizes Understanding      Oxygen Equipment:  -Group instruction provided by [employer] utilizing handouts, written materials, and Insurance underwriter.   Signs and Symptoms:  -Group instruction provided by written material and verbal discussion to support subject matter. Warning signs and symptoms of infection, stroke, and heart attack are reviewed and when to call the physician/911 reinforced. Tips for preventing the spread of infection discussed.   PULMONARY REHAB OTHER RESPIRATORY from 11/01/2017 in [place]  Date  09/01/17  Educator  RN  Instruction Review Code  1- Verbalizes Understanding      Advanced Directives:  -Group instruction provided by verbal instruction and written material to support subject matter. Instructor reviews Advanced Directive laws and proper instruction for filling out document.   Pulmonary Video:  -Group video education that reviews the importance of medication and oxygen compliance, exercise, good nutrition, pulmonary hygiene, and pursed lip and diaphragmatic breathing for the pulmonary patient.   Exercise for the Pulmonary Patient:  -Group instruction that is supported by a PowerPoint presentation. Instructor discusses benefits of exercise, core components of exercise, frequency, duration, and intensity of an exercise routine, importance of utilizing pulse oximetry during exercise, safety while exercising, and  options of places to exercise outside of rehab.     PULMONARY REHAB OTHER RESPIRATORY from 11/01/2017 in [place]  Date  09/22/17  Educator  Cloyde Reams  Instruction Review Code  1- Verbalizes Understanding      Pulmonary Medications:  -Verbally interactive group education provided by instructor with focus on inhaled medications and proper administration.   PULMONARY REHAB OTHER RESPIRATORY from 11/01/2017 in [place]  Date  11/01/17  Educator  pharm      Anatomy and Physiology of the Respiratory System and Intimacy:  -Group instruction provided by PowerPoint, verbal discussion, and written material to support subject matter. Instructor reviews respiratory cycle and anatomical components of the respiratory system and their functions. Instructor also reviews differences in obstructive and restrictive respiratory diseases with examples of each. Intimacy, Sex, and Sexuality differences are reviewed with a discussion on how relationships can change when diagnosed with pulmonary disease. Common sexual concerns are reviewed.   PULMONARY REHAB OTHER RESPIRATORY from 11/01/2017 in [place]  Date  10/13/17  Educator  rn  Instruction Review Code  2- Demonstrated Understanding      MD DAY -A group question and answer session with a medical doctor that allows participants to ask questions that relate to their pulmonary disease state.   PULMONARY REHAB OTHER RESPIRATORY from 11/01/2017 in [place]  Date  09/27/17  Educator  yacoub  Instruction Review Code  2- Demonstrated Understanding      OTHER EDUCATION -Group or individual verbal, written, or video instructions that support the educational goals of the pulmonary rehab program.   Holiday Eating Survival Tips:  -Group instruction provided by PowerPoint slides, verbal discussion, and written materials to support  subject matter. The instructor gives patients tips, tricks, and techniques to help them not only survive but enjoy the holidays despite the onslaught of food that accompanies the holidays.   Knowledge Questionnaire Score:   Core Components/Risk Factors/Patient Goals at Admission: Personal Goals and Risk Factors at Admission - 08/10/17 1433      Core Components/Risk Factors/Patient Goals on Admission    Weight Management  Obesity;Weight Loss    Intervention  Weight Management: Develop a combined nutrition and exercise program designed to reach desired caloric intake, while maintaining appropriate intake of nutrient and fiber, sodium and fats, and appropriate energy expenditure required for the weight goal.;Weight Management: Provide education and appropriate resources to help participant work on and attain dietary goals.;Weight Management/Obesity: Establish reasonable short term and long term weight goals.    Admit Weight  243 lb 6.2 oz (110.4 kg)    Goal Weight: Short Term  200 lb (90.7 kg)    Goal Weight: Long Term  175 lb (79.4 kg)    Expected Outcomes  Short Term: Continue to assess and modify interventions until short term weight is achieved;Long Term: Adherence to nutrition and physical activity/exercise program aimed toward attainment of established weight goal;Weight Loss: Understanding of general recommendations for a balanced deficit meal plan, which promotes 1-2 lb weight loss per week and includes a negative energy balance of [id number] kcal/d;Understanding recommendations for meals to include 15-35% energy as protein, 25-35% energy from fat, 35-60% energy from carbohydrates, less than 257m of dietary cholesterol, 20-35 gm of total fiber daily;Understanding of distribution of calorie intake throughout the day with the consumption of 4-5 meals/snacks    Improve shortness of breath with ADL's  Yes    Intervention  Provide education, individualized exercise plan and daily activity instruction  to help decrease symptoms of SOB with activities of daily living.    Expected Outcomes  Short Term: Improve cardiorespiratory fitness to achieve a reduction of symptoms when performing ADLs;Long Term: Be able to perform more ADLs without symptoms or delay the onset of symptoms    Diabetes  Yes    Intervention  Provide education about proper nutrition, including hydration, and aerobic/resistive exercise prescription along with prescribed medications to achieve blood glucose in normal ranges: Fasting glucose 65-99 mg/dL;Provide education about signs/symptoms and action to take for hypo/hyperglycemia.    Expected Outcomes  Short Term: Participant verbalizes understanding of the signs/symptoms and immediate care of hyper/hypoglycemia, proper foot care and importance of medication, aerobic/resistive exercise and nutrition plan for blood glucose control.;Long Term: Attainment of HbA1C < 7%.    Stress  Yes    Intervention  Offer individual and/or small group education and counseling on adjustment to heart disease, stress management and health-related lifestyle change. Teach and support self-help strategies.;Refer participants experiencing significant psychosocial distress to appropriate mental  health specialists for further evaluation and treatment. When possible, include family members and significant others in education/counseling sessions.    Expected Outcomes  Short Term: Participant demonstrates changes in health-related behavior, relaxation and other stress management skills, ability to obtain effective social support, and compliance with psychotropic medications if prescribed.;Long Term: Emotional wellbeing is indicated by absence of clinically significant psychosocial distress or social isolation.       Core Components/Risk Factors/Patient Goals Review:  Goals and Risk Factor Review    Row Name 09/12/17 1335 09/14/17 1147 10/11/17 1012 11/09/17 1645       Core Components/Risk Factors/Patient Goals  Review   Personal Goals Review  Weight Management/Obesity;Develop more efficient breathing techniques such as purse lipped breathing and diaphragmatic breathing and practicing self-pacing with activity.;Diabetes;Improve shortness of breath with ADL's;Increase knowledge of respiratory medications and ability to use respiratory devices properly.;Stress  -  Weight Management/Obesity;Develop more efficient breathing techniques such as purse lipped breathing and diaphragmatic breathing and practicing self-pacing with activity.;Improve shortness of breath with ADL's;Increase knowledge of respiratory medications and ability to use respiratory devices properly.;Stress  Weight Management/Obesity;Develop more efficient breathing techniques such as purse lipped breathing and diaphragmatic breathing and practicing self-pacing with activity.;Improve shortness of breath with ADL's;Increase knowledge of respiratory medications and ability to use respiratory devices properly.;Stress    Review  Pt off to a great start.  Pt has completed 4 exercise sessions since 08/30/17.  Pt weight shows increase from 107.2 kg to 108.7 kg.  Pt has not engaged in home exerice on her days off due to deconditioning and fear of not knowing what she can do safely on her own.  Will plan to have the Exercise Physiologist talk with pt regarding home exercise.  Pt continue to feel stressed with her lung disease and the uncertainity of what is to come.  Pt has attened oxygen safety class and demonstrates a good working knowledge of her pendulant cannula.  Pt observed having good PLB techniques.   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  Will resolve this as a patient goal.  Pt off to a great start.  Pt has completed 4 exercise sessions since 08/30/17.  Pt weight shows increase from 107.2 kg to 108.7 kg.  Pt has not engaged in home exerice on her days off due to deconditioning and fear of not knowing what she can do safely on her own.  Will plan  to have the Exercise Physiologist talk with pt regarding home exercise.  Pt continue to feel stressed with her lung disease and the uncertainity of what is to come.  Pt has attened oxygen safety class and demonstrates a good working knowledge of her pendulant cannula.  Pt observed having good PLB techniques.   Pt workloads show arm crank level 2, nustep level 2 and track averages 8-10 laps. 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  Will resolve this as a patient goal.   Pt has completed 10 exercise sessions since 08/30/17.  Pt weight shows dcrease from 107.2 kg to 106.5kg.  Dietician to see her regarding weight loss. 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   EP met with pt on 8/1 to talk about home exercise. Pt continue to feel stressed with her lung disease  and the uncertainity of what is to come particularly since she received the news she has 5 months.  Pt has attended meditation and mindfulness class.  Continue to support pt in her efforts. Pt observed having good PLB techniques.   Pt workloads show arm crank level 2, nustep level 4 and track averages 15 laps. 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  Will resolve this as a patient goal.   Pt has completed 17 exercise sessions since 08/30/17.  Pt weight shows decrease 4 kg.  Dietician to see her regarding weight loss and she is doing a high protein low carbohydrate diet. Pt knows she needs to lose weight in order to be considered for lung transplant.  This has been difficult for pt due to steriod therapy .  Pt is in [facility] for second opinon and to see if she qualifies for clinical trials for ILD.  Pt observed  having good PLB techniques.   Pt workloads show arm crank level 2, nustep level 4 and track averages 15 laps. Await pt return to exercise.  Continue to monitor pt progress during the next 30 day assessment.    Expected Outcomes  Pt will show progress toward weight loss so that she will be able to be eligible for placement for lung transplant.    Stressors will be  eliminated and/or  will report appropriate and healthy coping skills to deal with stressors.  pt will use proper teechnique of diaphragmatic breathing and PLB which will improve her shortness of breath  See Admission Goals/Outcomes  See Admission Goals/Outcomes  See Admission Goals/Outcomes       Core Components/Risk Factors/Patient Goals at Discharge (Final Review):  Goals and Risk Factor Review - 11/09/17 1645      Core Components/Risk Factors/Patient Goals Review   Personal Goals Review  Weight Management/Obesity;Develop more efficient breathing techniques such as purse lipped breathing and diaphragmatic breathing and practicing self-pacing with activity.;Improve shortness of breath with ADL's;Increase knowledge of respiratory medications and ability to use respiratory devices properly.;Stress    Review   Pt has completed 17 exercise sessions since 08/30/17.  Pt weight shows decrease 4 kg.  Dietician to see her regarding weight loss and she is doing a high protein low carbohydrate diet. Pt knows she needs to lose weight in order to be considered for lung transplant.  This has been difficult for pt due to steriod therapy .  Pt is in [facility] for second opinon and to see if she qualifies for clinical trials for ILD.  Pt observed having good PLB techniques.   Pt workloads show arm crank level 2, nustep level 4 and track averages 15 laps. Await pt return to exercise.  Continue to monitor pt progress during the next 30 day assessment.    Expected Outcomes  See Admission Goals/Outcomes       ITP Comments: ITP Comments    Row Name  08/10/17 1359 09/12/17 1453 10/12/17 0931 11/09/17 1642     ITP Comments  Dr. Jennet Maduro, Medical Director  Dr. Jennet Maduro, Medical Director  Dr. Jennet Maduro, Medical Director  Dr. Jennet Maduro, Medical Director Pulmonary Rehab       Comments: Pt completed 17 exercise sessions.  Continue to monitor pt progress. Cherre Huger, BSN Cardiac and Training and development officer

## 2017-11-10 ENCOUNTER — Encounter (HOSPITAL_COMMUNITY): Payer: BLUE CROSS/BLUE SHIELD

## 2017-11-15 ENCOUNTER — Encounter (HOSPITAL_COMMUNITY): Payer: BLUE CROSS/BLUE SHIELD

## 2017-11-17 ENCOUNTER — Ambulatory Visit: Payer: BLUE CROSS/BLUE SHIELD | Admitting: Internal Medicine

## 2017-11-17 ENCOUNTER — Encounter (HOSPITAL_COMMUNITY): Payer: BLUE CROSS/BLUE SHIELD

## 2017-11-22 ENCOUNTER — Encounter (HOSPITAL_COMMUNITY)
Admission: RE | Admit: 2017-11-22 | Discharge: 2017-11-22 | Disposition: A | Discharge status: Home / Self Care | Payer: BLUE CROSS/BLUE SHIELD | Source: Ambulatory Visit | Attending: Pulmonary Disease | Admitting: Pulmonary Disease

## 2017-11-22 ENCOUNTER — Encounter: Payer: Self-pay | Admitting: Internal Medicine

## 2017-11-22 ENCOUNTER — Ambulatory Visit (INDEPENDENT_AMBULATORY_CARE_PROVIDER_SITE_OTHER): Payer: BLUE CROSS/BLUE SHIELD | Admitting: Internal Medicine

## 2017-11-22 VITALS — BP 122/74 | HR 110 | Ht 62.0 in | Wt 231.6 lb

## 2017-11-22 VITALS — Wt 222.9 lb

## 2017-11-22 DIAGNOSIS — J849 Interstitial pulmonary disease, unspecified: Secondary | ICD-10-CM

## 2017-11-22 DIAGNOSIS — J9611 Chronic respiratory failure with hypoxia: Secondary | ICD-10-CM

## 2017-11-22 DIAGNOSIS — Z79899 Other long term (current) drug therapy: Secondary | ICD-10-CM | POA: Diagnosis not present

## 2017-11-22 DIAGNOSIS — Z23 Encounter for immunization: Secondary | ICD-10-CM | POA: Diagnosis not present

## 2017-11-22 DIAGNOSIS — E119 Type 2 diabetes mellitus without complications: Secondary | ICD-10-CM | POA: Insufficient documentation

## 2017-11-22 DIAGNOSIS — Z7952 Long term (current) use of systemic steroids: Secondary | ICD-10-CM | POA: Diagnosis not present

## 2017-11-22 DIAGNOSIS — Z7901 Long term (current) use of anticoagulants: Secondary | ICD-10-CM | POA: Diagnosis not present

## 2017-11-22 MED ORDER — PREDNISONE 10 MG PO TABS: 10.0000 mg | ORAL_TABLET | Freq: Every day | ORAL | 3 refills | Status: DC

## 2017-11-22 MED ORDER — PREDNISONE 10 MG PO TABS: ORAL_TABLET | ORAL | 3 refills | Status: DC

## 2017-11-22 MED ORDER — SULFAMETHOXAZOLE-TRIMETHOPRIM 400-80 MG PO TABS: ORAL_TABLET | ORAL | 1 refills | Status: DC

## 2017-11-22 NOTE — Progress Notes (Signed)
Subjective:    Patient ID: Tasha Ballard, female    DOB: 04-10-1970, 47 y.o.   MRN: [id number]  PCP System, Pcp Not In   HPI  IOV 11/22/2017  Chief Complaint  Patient presents with  . Consult    consut due to pulmonary fibrosis. Pt has been followed by [facility] and last PFT was peformed 8/14 at [facility]. Pt does have complaints of SOB with exertion which she states has become better due to rehab. Pt also has a dry hacking cough with mild production of mucus. Denies any CP.    Is seen by presents to the ILD clinic.  This is her first visit with [name].  History is obtained from her and review of the [place] where she is to follow-up with Dr. Hortencia Pilar.  Interstitial lung disease clinic integrated interstitial lung disease questionnaire: Symptoms: Shortness of breath for years.  Gradually worse.  Currently level 5 shortness of breath walking up stairs or walking up a hill with oxygen.  Level for shortness of breath for sweeping or vacuuming and level 3 shortness of breath for shopping or picking up things and doing laundry.  Level 3 shortness of breath or standing up from a chair or taking a shower making the bed and level 1 dyspnea for brushing teeth.  She also has a cough for 6 years which is getting worse.  On and off that is phlegm.  Occasional wheezing present there is associated fatigue.  She tells me that she had undiagnosed and unrecognized ILD for years.  This was then picked up at [facility] and then she got referred to [facility].  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  At this time she was placed on CellCept.  Then in 2016 she got somewhat worse and was placed on 2 L nasal cannula continuously.  In 2017 she tells me that she had a aortic aneurysm and this was repaired at [facility].  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   And then for the last year and a half or so she has been on significantly worsening hypoxemia.  In the spring 2019 she is requiring 8 L at rest and 15 L at exertion.  Apparently Dr. Lauris Chroman then recommended Rituxan infusions which she got to 60 days apart with the last one being approximately in April of May 2019.  After that she is somewhat improved requiring only 9-10 L with exertion.  She has been attending pulmonary rehabilitation which has been helping.  She is obese and unable to lose weight although she is lost some.  She believes a lot of her oxygen needs might be related to deviated nasal septum/septal perforation.  She subsequently followed up with Dr. Ruthann Cancer at [facility] in the summer 2019.  I reviewed the notes.  Palliative care was recommended because of progressive respiratory failure.  She has never seen the transplant clinic at [facility] but she recommends that because of obesity and diaphragmatic dysfunction that she would not be a good candidate for transplant .  She was then frustrated with experience because she wants to fight the disease.  She then drove to [facility] and saw Dr. Elisabeth Cara at [facility] 1 week ago.  She spent substantial number of days there.  At this point in time she showed me an email from him that  suggest he might have myositis-ILD but they would like to review her CT scans and pathology from [facility].  Apparently she had significant amount of blood work and other tests at [facility]  I do not have access to these records at this point.  She believes that she wants to undergo transplant.  She thinks that she would be able to have transplant in [place] despite a high BMI.  Currently in [place] she is somewhat stable attending pulmonary rehabilitation.  She is interested in pulmonary trials  Past medical history  -Tested for sleep apnea at [facility] and was told to be negative.  But at [facility] in September 2019 they  recommended a repeat sleep study -Denied all forms of connective tissue disease diagnosis and vasculitis.  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  Positive for dry eyes and mouth for few years.  No Raynaud.  No recurrent fever.  No weight loss.  No acid reflux but does admit to snoring for a few years.  No rash no ulcers.  Family history of lung disease COPD and a long but no pulmonary fibrosis.  Dad did have rheumatoid arthritis  Personal exposure history Denies tobacco, vaping, marijuana, cocaine, IV drugs  Home and hobby history  [place] which is [place] years old  Occupational history -She does stay in a condition spaces.  122 point occupational questionnaire completely negative  Medication history -She has a history of allergy to Macrobid/nitrofurantoin.  Do not know when she took it.  She is currently on immunosuppressive regimen of CellCept, Bactrim and prednisone.  CellCept since 2013 and prednisone since 2012.  She did get Rituxan x2 doses with the last one being in spring 2019.  She feels her hypoxemia improved somewhat after that.     has a past medical history of Aneurysm ([place]), Diabetes mellitus without complication ([place]), and Thyroid disease.   reports that she has never smoked. She has never used smokeless tobacco.    Allergies  Allergen Reactions  . Macrobid [Nitrofurantoin Macrocrystal] Nausea And Vomiting    Immunization History  Administered Date(s) Administered  . Influenza,inj,Quad PF,6+ Mos 12/24/2014, 02/05/2016, 01/07/2017, 11/22/2017  .  Pneumococcal Conjugate-13 07/20/2017  . Pneumococcal Polysaccharide-23 06/09/2012    No family history on file.   Current Outpatient Medications:  .  apixaban (ELIQUIS) 5 MG TABS tablet, Take 5 mg by mouth 2 (two) times daily., Disp: , Rfl:  .  benzonatate (TESSALON) 100 MG capsule, Take by mouth 3 (three) times daily as needed for cough., Disp: , Rfl:  .  carvedilol (COREG) 6.25 MG tablet, Take 6.25 mg by mouth 2 (two) times daily with a meal., Disp: , Rfl:  .  cholecalciferol (VITAMIN D) 400 units TABS tablet, Take 800 Units by mouth. Pt takes 1,000 units daily, Disp: , Rfl:  .  furosemide (LASIX) 40 MG tablet, Take 40 mg by mouth daily. 40 mg daily but on days of swelling twice a day, Disp: , Rfl:  .  metFORMIN (GLUCOPHAGE) 500 MG tablet, Take by mouth 2 (two) times daily with a meal., Disp: , Rfl:  .  montelukast (SINGULAIR) 10 MG tablet, Take 10 mg by mouth daily after supper., Disp: , Rfl:  .  mycophenolate (CELLCEPT) 500 MG tablet, Take 1,500 mg by mouth 2 (two) times daily. Takes 1 500 mg twice a day, Disp: , Rfl:  .  predniSONE (DELTASONE) 10 MG tablet, Take 1 tablet (10 mg total) by mouth daily with breakfast., Disp: 90 tablet, Rfl: 3 .  spironolactone (ALDACTONE) 100 MG tablet, Take 100 mg by mouth daily., Disp: , Rfl:  .  Sulfamethoxazole-Trimethoprim (SEPTRA DS PO), Take by mouth daily. 400-80  Once a day while on prednisone, Disp: , Rfl:  .  thyroid (ARMOUR) 30 MG tablet, Take 60 mg by mouth daily before breakfast. Takes 60 mg tablet, Disp: , Rfl:  .  acyclovir (ZOVIRAX) 400 MG tablet, Take 400 mg by mouth 3 (three) times daily., Disp: , Rfl:  .  predniSONE (DELTASONE) 10 MG tablet, Take 10-53m as needed for SOB, Disp: 30 tablet, Rfl: 3 .  sulfamethoxazole-trimethoprim (BACTRIM) 400-80 MG tablet, Once a day while on prednisone, Disp: 90 tablet, Rfl: 1    Review of Systems  Constitutional: Negative for fever and unexpected weight change.  HENT: Positive for congestion, ear  pain, nosebleeds, postnasal drip, sinus pressure and sneezing. Negative for dental problem, rhinorrhea, sore throat and trouble swallowing.   Eyes: Negative for redness and itching.  Respiratory: Positive for cough and shortness of breath. Negative for chest tightness.   Cardiovascular: Positive for palpitations and leg swelling.  Gastrointestinal: Negative for nausea and vomiting.  Genitourinary: Negative for dysuria.  Musculoskeletal: Positive for joint swelling.  Skin: Negative for rash.  Allergic/Immunologic: Negative.  Negative for environmental allergies, food allergies and immunocompromised state.  Neurological: Positive for headaches.  Hematological: Bruises/bleeds easily.  Psychiatric/Behavioral: Negative for dysphoric mood. The patient is nervous/anxious.        Objective:   Physical Exam  Vitals:   11/22/17 1500  BP: 122/74  Pulse: (!) 110  SpO2: 98%  Weight: 231 lb 9.6 oz (105.1 kg)  Height: _0  (1.575 m)    Estimated body mass index is 42.36 kg/m as calculated from the following:   Height as of this encounter: _1  (1.575 m).   Weight as of this encounter: 231 lb 9.6 oz (105.1 kg).   General Appearance:    Alert, cooperative, no distress, appears stated age - yes , sitting on - chiair  Head:    Normocephalic, without obvious abnormality, atraumatic  Eyes:    PERRL, conjunctiva/corneas clear,  Ears:    Normal TM's and external ear canals, both ears  Nose:   Nares normal, septum midline, mucosa normal, no drainage    or sinus tenderness. OXYGEN ON yes @ 8L [place] at[place]  Throat:   Lips, mucosa, and tongue normal; teeth and gums normal. Cyanosis on lips - no  Neck:   Supple, symmetrical, trachea midline, no adenopathy;    thyroid:  no enlargement/tenderness/nodules; no carotid   bruit or JVD  Back:     Symmetric, no curvature, ROM normal, no CVA tenderness  Lungs:     Distress - no , Wheeze no, Barrell Chest - no, Purse lip breathing - no, Crackles - mild at base    Chest Wall:    No tenderness or deformity. Scars in chest no   Heart:    Regular rate and rhythm, S1 and S2 normal, no murmur, rub   or gallop  Breast Exam:    NOT DONE  Abdomen:  Soft, non-tender, bowel sounds active all four quadrants,    no masses, no organomegaly  Genitalia:   NOT DONE  Rectal:   NOT DONE  Extremities:   Extremities normal, atraumatic, Clubbing - no, Edema - no  Pulses:   2+ and symmetric all extremities  Skin:   Stigmata of Connective Tissue Disease - no, hands appear dry  Lymph nodes:   Cervical, supraclavicular, and axillary nodes normal  Psychiatric:  Neurologic:   pleasant CNII-XII intact, normal strength, sensation  throughout            Assessment & Plan:     ICD-10-CM   1. Interstitial pulmonary disease ([place]) J84.9   2. Chronic respiratory failure with hypoxia (HCC) J96.11   3. Need for immunization against influenza Z23 Flu Vaccine QUAD 36+ mos IM    Patient Instructions     ICD-10-CM   1. Interstitial pulmonary disease ([place]) J84.9   2. Chronic respiratory failure with hypoxia (HCC) J96.11      Nice to have met you.   Plan Will support you through your fight against pulmonary fibrosis Flu shot 11/22/2017 All refills with [name] - tell my CMA what you want Continue rehab Continue o2 Continue cellcept, pred, bactrim Will reach out to Dr [name] Rocher at [facility] way for me to support you is to get [place] and execute their plan Please sign release to get data from national Jewish Please take copy of ILD - PRO consent - [facility] research staff will reach out to you next few to several weeks  Followup - 4 weeks in ILD clinic to discuss progress  -     > 50% of this > 40 min visit spent in face to face counseling or/and coordination of care - by this undersigned MD - Dr Brand Males. This includes one or more of the following documented above: discussion of test results, diagnostic or treatment recommendations, prognosis,  risks and benefits of management options, instructions, education, compliance or risk-factor reduction   SIGNATURE    Dr. Brand Males, M.D., F.C.C.P,  Pulmonary and Critical Care Medicine Staff Physician, [place] Director - Interstitial Lung Disease  Program  Pulmonary [place] at [place], [place], [address]  Pager: [phone], If no answer or between  15:00h - 7:00h: call [phone] Telephone: [phone]  6:06 PM 11/22/2017

## 2017-11-22 NOTE — Progress Notes (Signed)
Daily Session Note  Patient Details  Name: Tasha Ballard MRN: [id number] Date of Birth: [birth year] Referring Provider:     Pulmonary Rehab Walk Test from 08/11/2017 in [place]  Referring Provider  Dr. Rayann Heman ]      Encounter Date: 11/22/2017  Check In: Session Check In - 11/22/17 1232      Check-In   Supervising physician immediately available to respond to emergencies  [facility] immediately available    Physician(s)  Dr. Louanne Belton    Location  [facility]    Staff Present  Maurice Small, RN, BSN;Molly DiVincenzo, MS, ACSM RCEP, Exercise Physiologist;[place]ett [place] Ysidro Evert, Felipe Drone, RN, MHA    Medication changes reported      No    Fall or balance concerns reported     No    Tobacco Cessation  No Change    Warm-up and Cool-down  Performed as group-led instruction    Resistance Training Performed  Yes    VAD Patient?  No    PAD/SET Patient?  No      Pain Assessment   Currently in Pain?  No/denies    Pain Score  0-No pain    Multiple Pain Sites  No       Capillary Blood Glucose: No results found for this or any previous visit (from the past 24 hour(s)). POCT Glucose - 11/22/17 1546      POCT Blood Glucose   Pre-Exercise  113 mg/dL    Post-Exercise  173 mg/dL      Exercise Prescription Changes - 11/22/17 1500      Response to Exercise   Blood Pressure (Admit)  112/70    Blood Pressure (Exercise)  104/80    Blood Pressure (Exit)  102/76    Heart Rate (Admit)  98 bpm    Heart Rate (Exercise)  108 bpm    Heart Rate (Exit)  95 bpm    Oxygen Saturation (Admit)  97 %    Oxygen Saturation (Exercise)  97 %    Oxygen Saturation (Exit)  99 %    Rating of Perceived Exertion (Exercise)  12    Perceived Dyspnea (Exercise)  1.5    Duration  Progress to 45 minutes of aerobic exercise without signs/symptoms of physical distress    Intensity  THRR unchanged      Progression   Progression  Continue to  progress workloads to maintain intensity without signs/symptoms of physical distress.      Resistance Training   Training Prescription  Yes    Weight  blue bands    Reps  10-15    Time  10 Minutes      Interval Training   Interval Training  No      Oxygen   Oxygen  Continuous    Liters  15      Treadmill   MPH  1.5    Grade  0    Minutes  17      NuStep   Level  2    SPM  80    Minutes  17    METs  1.4      Arm Ergometer   Level  1    Watts  10    Minutes  17       Social History   Tobacco Use  Smoking Status Never Smoker  Smokeless Tobacco Never Used    Goals Met:  Exercise tolerated well No report of cardiac concerns  or symptoms Strength training completed today  Goals Unmet:  Not Applicable  Comments: Service time is from 1030 to 1215    Dr. Rush Farmer is Medical Director for Pulmonary Rehab at [facility].

## 2017-11-22 NOTE — Patient Instructions (Addendum)
ICD-10-CM   1. Interstitial pulmonary disease ([place]) J84.9   2. Chronic respiratory failure with hypoxia (HCC) J96.11      Nice to have met you.   Plan Will support you through your fight against pulmonary fibrosis Flu shot 11/22/2017 All refills with [name] - tell my CMA what you want Continue rehab Continue o2 Continue cellcept, pred, bactrim Will reach out to Dr [name] Rocher at [facility] way for me to support you is to get [place] and execute their plan Please sign release to get data from national Jewish Please take copy of ILD - PRO consent - [facility] research staff will reach out to you next few to several weeks  Followup - 4 weeks in ILD clinic to discuss progress  -

## 2017-11-24 ENCOUNTER — Encounter (HOSPITAL_COMMUNITY)
Admission: RE | Admit: 2017-11-24 | Discharge: 2017-11-24 | Disposition: A | Discharge status: Home / Self Care | Payer: BLUE CROSS/BLUE SHIELD | Source: Ambulatory Visit | Attending: Internal Medicine | Admitting: Internal Medicine

## 2017-11-24 VITALS — Ht 62.0 in | Wt 221.0 lb

## 2017-11-24 DIAGNOSIS — J849 Interstitial pulmonary disease, unspecified: Secondary | ICD-10-CM | POA: Diagnosis not present

## 2017-11-24 NOTE — Progress Notes (Signed)
Daily Session Note  Patient Details  Name: Tasha Ballard MRN: [id number] Date of Birth: 1970-04-19 Referring Provider:     Pulmonary Rehab Walk Test from 08/11/2017 in [place]  Referring Provider  Dr. Rayann Heman ]      Encounter Date: 11/24/2017  Check In: Session Check In - 11/24/17 [place]      Check-In   Supervising physician immediately available to respond to emergencies  [facility] immediately available    Physician(s)  Dr. Horris Latino    Location  [facility]    Staff Present  Maurice Small, RN, BSN;Molly DiVincenzo, MS, ACSM RCEP, Exercise Physiologist;Ustin Cruickshank Ysidro Evert, Felipe Drone, RN, MHA    Medication changes reported      No    Fall or balance concerns reported     No    Tobacco Cessation  No Change    Warm-up and Cool-down  Not performed (comment)    Resistance Training Performed  Yes    VAD Patient?  No    PAD/SET Patient?  No      Pain Assessment   Currently in Pain?  No/denies    Multiple Pain Sites  No       Capillary Blood Glucose: No results found for this or any previous visit (from the past 24 hour(s)).    Social History   Tobacco Use  Smoking Status Never Smoker  Smokeless Tobacco Never Used    Goals Met:  Exercise tolerated well No report of cardiac concerns or symptoms Strength training completed today  Goals Unmet:  Not Applicable  Comments: Service time is from 1030 to 1215    Dr. Rush Farmer is Medical Director for Pulmonary Rehab at [facility].

## 2017-11-24 NOTE — Progress Notes (Addendum)
Tasha Ballard 47 y.o. female   DOB: 1970/11/23 MRN: [id number]          Nutrition No diagnosis found. Past Medical History:  Diagnosis Date  . Aneurysm ([place])   . Diabetes mellitus without complication ([place])   . Thyroid disease    Meds reviewed. Coreg, vit d, prednisone, armour, metformin noted  Ht: Ht Readings from Last 1 Encounters:  08/10/17 _0  (1.575 m)     Wt:  Wt Readings from Last 3 Encounters:  09/27/17 238 lb 5.1 oz (108.1 kg)  09/13/17 239 lb 6.7 oz (108.6 kg)  09/08/17 239 lb 10.2 oz (108.7 kg)      Current tobacco use? no Labs:  Lipid Panel  No results found for: CHOL, TRIG, HDL, CHOLHDL, VLDL, LDLCALC, LDLDIRECT  No results found for: HGBA1C  Note Spoke with pt today. Pt continues to eat frequently across the day, still keeping a food journal, has been using measuring spoons and cups to ensure accuracy of food portion size. Pt shared her weight this morning was 121 lbs, praised pt for her hard work, encouraged her to keep it up.  Making healthy food choices the majority of the time. Reviewed food journal with patient protein 35%, carbs 39%, fat 26%. Encouraged pt to continue to utilize protein shakes and bars as a quick and easy option on the go, but continue to be aware of total amount of protein consumed across the day. Since last visit pt has increased amount of healthy fat consumed at meals. Pt shared her doctor had mentioned fasting as a way to help accelerate weight loss. Discussed with patient intermittent fasting, specifically time restricted feeding, eating within an 8 hour window. Discussed with patient that this has been helpful with insulin resistance, and weight loss for some people. Explored the pros and cons with patient, and expressed that writer would support her efforts in intermittent fasting. Pt verbalized understanding.   Nutrition Diagnosis ? Overweight/obesity related to excessive energy intake as evidenced by a BMI of 43.59  Nutrition  Intervention ? Pt's individual nutrition plan and goals reviewed with pt. ? Benefits of adopting healthy eating habits discussed when pt's Rate Your Plate reviewed.  Goal(s) 1. Pt to identify and limit food sources of sodium, saturated fat, trans fat, refined carbohydrates 2. Identify food quantities necessary to achieve wt loss of  -2# per week to a goal wt loss of 6-24 lb at graduation from pulmonary rehab  Plan:  Pt to attend Pulmonary Nutrition class Will provide client-centered nutrition education as part of interdisciplinary care.    Monitor and Evaluate progress toward nutrition goal with team.   Laurina Bustle, MS, RD, LDN 11/24/2017 12:11 PM

## 2017-11-29 ENCOUNTER — Encounter (HOSPITAL_COMMUNITY)
Admission: RE | Admit: 2017-11-29 | Discharge: 2017-11-29 | Disposition: A | Discharge status: Home / Self Care | Payer: BLUE CROSS/BLUE SHIELD | Source: Ambulatory Visit | Attending: Pulmonary Disease | Admitting: Pulmonary Disease

## 2017-11-29 DIAGNOSIS — J849 Interstitial pulmonary disease, unspecified: Secondary | ICD-10-CM | POA: Diagnosis not present

## 2017-11-29 NOTE — Progress Notes (Signed)
Daily Session Note  Patient Details  Name: Tasha Ballard MRN: [id number] Date of Birth: Jun 30, 1970 Referring Provider:     Pulmonary Rehab Walk Test from 08/11/2017 in [place]  Referring Provider  Dr. Rayann Heman ]      Encounter Date: 11/29/2017  Check In: Session Check In - 11/29/17 1200      Check-In   Supervising physician immediately available to respond to emergencies  [facility] immediately available    Physician(s)  Dr. Horris Latino    Location  [facility]    Staff Present  Su Hilt, MS, ACSM RCEP, Exercise Physiologist;Lisa Colletta [place], RN, MHA    Medication changes reported      No    Fall or balance concerns reported     No    Tobacco Cessation  No Change    Warm-up and Cool-down  Performed as group-led instruction    Resistance Training Performed  Yes    VAD Patient?  No    PAD/SET Patient?  No      Pain Assessment   Currently in Pain?  No/denies       Capillary Blood Glucose: No results found for this or any previous visit (from the past 24 hour(s)).    Social History   Tobacco Use  Smoking Status Never Smoker  Smokeless Tobacco Never Used    Goals Met:  Exercise tolerated well  Goals Unmet:  Not Applicable  Comments: Service time is from 10:30a to 12:15p    Dr. Rush Farmer is Medical Director for Pulmonary Rehab at [facility].

## 2017-12-01 ENCOUNTER — Encounter (HOSPITAL_COMMUNITY)
Admission: RE | Admit: 2017-12-01 | Discharge: 2017-12-01 | Disposition: A | Discharge status: Home / Self Care | Payer: BLUE CROSS/BLUE SHIELD | Source: Ambulatory Visit | Attending: Internal Medicine | Admitting: Internal Medicine

## 2017-12-01 VITALS — Wt 229.3 lb

## 2017-12-01 DIAGNOSIS — J849 Interstitial pulmonary disease, unspecified: Secondary | ICD-10-CM | POA: Diagnosis not present

## 2017-12-01 NOTE — Progress Notes (Signed)
Daily Session Note  Patient Details  Name: Tasha Ballard MRN: [id number] Date of Birth: Jun 05, 1970 Referring Provider:     Pulmonary Rehab Walk Test from 08/11/2017 in [place]  Referring Provider  Dr. Rayann Heman ]      Encounter Date: 12/01/2017  Check In: Session Check In - 12/01/17 1254      Check-In   Supervising physician immediately available to respond to emergencies  [facility] immediately available   Simultaneous filing. User may not have seen previous data.   Physician(s)  Dr. Herbert Moors   Simultaneous filing. User may not have seen previous data.   Location  [facility]   Simultaneous filing. User may not have seen previous data.   Staff Present  Training and development officer, MS, ACSM RCEP, Exercise Physiologist;Dalton Fletcher, MS, Exercise Physiologist;Lisa Ysidro Evert, Felipe Drone, RN, MHA   Simultaneous filing. User may not have seen previous data.   Medication changes reported      No   Simultaneous filing. User may not have seen previous data.   Fall or balance concerns reported     No   Simultaneous filing. User may not have seen previous data.   Tobacco Cessation  No Change   Simultaneous filing. User may not have seen previous data.   Warm-up and Cool-down  Performed as group-led instruction   Simultaneous filing. User may not have seen previous data.   Resistance Training Performed  Yes   Simultaneous filing. User may not have seen previous data.   VAD Patient?  No   Simultaneous filing. User may not have seen previous data.   PAD/SET Patient?  No   Simultaneous filing. User may not have seen previous data.     Pain Assessment   Currently in Pain?  No/denies   Simultaneous filing. User may not have seen previous data.   Pain Score  0-No pain    Multiple Pain Sites  No   Simultaneous filing. User may not have seen previous data.      Capillary Blood Glucose: No results found for this or any  previous visit (from the past 24 hour(s)).    Social History   Tobacco Use  Smoking Status Never Smoker  Smokeless Tobacco Never Used    Goals Met:  Exercise tolerated well  Goals Unmet:  Not Applicable  Comments: Service time is from 10:30a to 12:20p    Dr. Rush Farmer is Medical Director for Pulmonary Rehab at [facility].

## 2017-12-07 NOTE — Progress Notes (Signed)
Pulmonary Individual Treatment Plan  Patient Details  Name: Tasha Ballard MRN: [id number] Date of Birth: 1970-03-18 Referring Provider:     Pulmonary Rehab Walk Test from 08/11/2017 in [facility]  Referring Provider  Dr. Rayann Heman ]      Initial Encounter Date:    Pulmonary Rehab Walk Test from 08/11/2017 in [facility]  Date  08/12/17      Visit Diagnosis: ILD (interstitial lung disease) ([place])  Patient's Home Medications on Admission:   Current Outpatient Medications:  .  acyclovir (ZOVIRAX) 400 MG tablet, Take 400 mg by mouth 3 (three) times daily., Disp: , Rfl:  .  apixaban (ELIQUIS) 5 MG TABS tablet, Take 5 mg by mouth 2 (two) times daily., Disp: , Rfl:  .  benzonatate (TESSALON) 100 MG capsule, Take by mouth 3 (three) times daily as needed for cough., Disp: , Rfl:  .  carvedilol (COREG) 6.25 MG tablet, Take 6.25 mg by mouth 2 (two) times daily with a meal., Disp: , Rfl:  .  cholecalciferol (VITAMIN D) 400 units TABS tablet, Take 800 Units by mouth. Pt takes 1,000 units daily, Disp: , Rfl:  .  furosemide (LASIX) 40 MG tablet, Take 40 mg by mouth daily. 40 mg daily but on days of swelling twice a day, Disp: , Rfl:  .  metFORMIN (GLUCOPHAGE) 500 MG tablet, Take by mouth 2 (two) times daily with a meal., Disp: , Rfl:  .  montelukast (SINGULAIR) 10 MG tablet, Take 10 mg by mouth daily after supper., Disp: , Rfl:  .  mycophenolate (CELLCEPT) 500 MG tablet, Take 1,500 mg by mouth 2 (two) times daily. Takes 1 500 mg twice a day, Disp: , Rfl:  .  predniSONE (DELTASONE) 10 MG tablet, Take 1 tablet (10 mg total) by mouth daily with breakfast., Disp: 90 tablet, Rfl: 3 .  predniSONE (DELTASONE) 10 MG tablet, Take 10-44m as needed for SOB, Disp: 30 tablet, Rfl: 3 .  spironolactone (ALDACTONE) 100 MG tablet, Take 100 mg by mouth daily., Disp: , Rfl:  .  sulfamethoxazole-trimethoprim (BACTRIM) 400-80 MG tablet, Once a day while  on prednisone, Disp: 90 tablet, Rfl: 1 .  Sulfamethoxazole-Trimethoprim (SEPTRA DS PO), Take by mouth daily. 400-80  Once a day while on prednisone, Disp: , Rfl:  .  thyroid (ARMOUR) 30 MG tablet, Take 60 mg by mouth daily before breakfast. Takes 60 mg tablet, Disp: , Rfl:   Past Medical History: Past Medical History:  Diagnosis Date  . Aneurysm ([facility]   . Diabetes mellitus without complication ([place]   . Thyroid disease     Tobacco Use: Social History   Tobacco Use  Smoking Status Never Smoker  Smokeless Tobacco Never Used    Labs: Recent Review Flowsheet Data    There is no flowsheet data to display.      Capillary Blood Glucose: Lab Results  Component Value Date   GLUCAP 162 (H) 09/01/2017   POCT Glucose    Row Name 08/30/17 1246 09/13/17 1255 09/27/17 1231 10/11/17 1252 10/25/17 1223     POCT Blood Glucose   Pre-Exercise  137 mg/dL  191 mg/dL  147 mg/dL  176 mg/dL  122 mg/dL   Post-Exercise  -  219 mg/dL  176 mg/dL  179 mg/dL  129 mg/dL   Row Name 11/08/17 1326 11/22/17 1546           POCT Blood Glucose   Pre-Exercise  135 mg/dL  113  mg/dL      Post-Exercise  135 mg/dL  173 mg/dL         Pulmonary Assessment Scores: Pulmonary Assessment Scores    Row Name 08/12/17 1055         ADL UCSD   ADL Phase  Entry       mMRC Score   mMRC Score  3        Pulmonary Function Assessment:   Exercise Target Goals: Exercise Program Goal: Individual exercise prescription set using results from initial 6 min walk test and THRR while considering  patient's activity barriers and safety.   Exercise Prescription Goal: Initial exercise prescription builds to 30-45 minutes a day of aerobic activity, 2-3 days per week.  Home exercise guidelines will be given to patient during program as part of exercise prescription that the participant will acknowledge.  Activity Barriers & Risk Stratification: Activity Barriers & Cardiac Risk Stratification - 08/10/17 1442       Activity Barriers & Cardiac Risk Stratification   Activity Barriers  Deconditioning;Shortness of Breath       6 Minute Walk: 6 Minute Walk    Row Name 08/12/17 1047         6 Minute Walk   Phase  Initial     Distance  800 feet     Walk Time  - 5 minutes 40 seconds     # of Rest Breaks  1 1 rest break initiated by EP due to low oxygen saturation     MPH  1.5     METS  2.15     RPE  13     Perceived Dyspnea   2     Symptoms  No     Resting HR  114 bpm     Resting BP  148/81     Resting Oxygen Saturation   97 %     Exercise Oxygen Saturation  during 6 min walk  80 %     Max Ex. HR  146 bpm     Max Ex. BP  136/81       Interval HR   1 Minute HR  118     2 Minute HR  129     3 Minute HR  135     4 Minute HR  135     5 Minute HR  146     6 Minute HR  145     Interval Heart Rate?  Yes       Interval Oxygen   Interval Oxygen?  Yes     Baseline Oxygen Saturation %  97 %     1 Minute Oxygen Saturation %  92 %     1 Minute Liters of Oxygen  8 L     2 Minute Oxygen Saturation %  80 %     2 Minute Liters of Oxygen  8 L     3 Minute Oxygen Saturation %  88 %     3 Minute Liters of Oxygen  10 L     4 Minute Oxygen Saturation %  86 %     4 Minute Liters of Oxygen  10 L     5 Minute Oxygen Saturation %  91 %     5 Minute Liters of Oxygen  15 L     6 Minute Oxygen Saturation %  91 %     6 Minute Liters of Oxygen  15 L  Oxygen Initial Assessment: Oxygen Initial Assessment - 08/12/17 1046      Initial 6 min Walk   Oxygen Used  Continuous;E-Tanks    Liters per minute  15   titrated from 8-15     Program Oxygen Prescription   Program Oxygen Prescription  Continuous;E-Tanks    Liters per minute  15       Oxygen Re-Evaluation: Oxygen Re-Evaluation    Row Name 08/19/17 1045 09/12/17 0912 10/10/17 0912 11/07/17 1642 12/06/17 0954     Program Oxygen Prescription   Program Oxygen Prescription  Continuous;E-Tanks  Continuous;E-Tanks  Continuous;E-Tanks   Continuous;E-Tanks  Continuous;E-Tanks   Liters per minute  _0 Comments  Pt wears 6 liters at rest (nasal cannula) and 10 liters on exertion  Pt wears 6 liters at rest (nasal cannula) and 10 liters on exertion  Pt wears 6 liters at rest (nasal cannula) and 10 liters on exertion  Pt wears 6 liters at rest (nasal cannula) and 15 liters on exertion  Pt wears 8 liters at rest (nasal cannula) and 15 liters on exertion     Home Oxygen   Home Oxygen Device  Portable Concentrator;Home Concentrator  Portable Concentrator;Home Concentrator  -  Portable Concentrator;Home Concentrator  Portable Concentrator;Home Concentrator   Sleep Oxygen Prescription  Continuous  Continuous  Continuous  Continuous  Continuous   Liters per minute  _1 Home Exercise Oxygen Prescription  Continuous  Continuous  Continuous  Continuous  Continuous   Liters per minute  _2 Home at Rest Exercise Oxygen Prescription  Continuous  Continuous  Continuous  Continuous  Continuous   Liters per minute  _3 Compliance with Home Oxygen Use  Yes  Yes  Yes  Yes  No     Goals/Expected Outcomes   Short Term Goals  To learn and exhibit compliance with exercise, home and travel O2 prescription;To learn and understand importance of monitoring SPO2 with pulse oximeter and demonstrate accurate use of the pulse oximeter.;To learn and understand importance of maintaining oxygen saturations>88%;To learn and demonstrate proper pursed lip breathing techniques or other breathing techniques.;To learn and demonstrate proper use of respiratory medications  To learn and exhibit compliance with exercise, home and travel O2 prescription;To learn and understand importance of monitoring SPO2 with pulse oximeter and demonstrate accurate use of the pulse oximeter.;To learn and understand importance of maintaining oxygen saturations>88%;To learn and demonstrate proper pursed lip breathing techniques or  other breathing techniques.;To learn and demonstrate proper use of respiratory medications  To learn and exhibit compliance with exercise, home and travel O2 prescription;To learn and understand importance of monitoring SPO2 with pulse oximeter and demonstrate accurate use of the pulse oximeter.;To learn and understand importance of maintaining oxygen saturations>88%;To learn and demonstrate proper pursed lip breathing techniques or other breathing techniques.;To learn and demonstrate proper use of respiratory medications  To learn and exhibit compliance with exercise, home and travel O2 prescription;To learn and understand importance of monitoring SPO2 with pulse oximeter and demonstrate accurate use of the pulse oximeter.;To learn and understand importance of maintaining oxygen saturations>88%;To learn and demonstrate proper pursed lip breathing techniques or other breathing techniques.;To learn and demonstrate proper use of respiratory medications  To learn and exhibit compliance with exercise, home and travel O2 prescription;To learn and understand importance  of monitoring SPO2 with pulse oximeter and demonstrate accurate use of the pulse oximeter.;To learn and understand importance of maintaining oxygen saturations>88%;To learn and demonstrate proper pursed lip breathing techniques or other breathing techniques.;To learn and demonstrate proper use of respiratory medications   Long  Term Goals  Exhibits compliance with exercise, home and travel O2 prescription;Verbalizes importance of monitoring SPO2 with pulse oximeter and return demonstration;Maintenance of O2 saturations>88%;Exhibits proper breathing techniques, such as pursed lip breathing or other method taught during program session;Compliance with respiratory medication;Demonstrates proper use of MDI's  Exhibits compliance with exercise, home and travel O2 prescription;Verbalizes importance of monitoring SPO2 with pulse oximeter and return  demonstration;Maintenance of O2 saturations>88%;Exhibits proper breathing techniques, such as pursed lip breathing or other method taught during program session;Compliance with respiratory medication;Demonstrates proper use of MDI's  Exhibits compliance with exercise, home and travel O2 prescription;Verbalizes importance of monitoring SPO2 with pulse oximeter and return demonstration;Maintenance of O2 saturations>88%;Exhibits proper breathing techniques, such as pursed lip breathing or other method taught during program session;Compliance with respiratory medication;Demonstrates proper use of MDI's  Exhibits compliance with exercise, home and travel O2 prescription;Verbalizes importance of monitoring SPO2 with pulse oximeter and return demonstration;Maintenance of O2 saturations>88%;Exhibits proper breathing techniques, such as pursed lip breathing or other method taught during program session;Compliance with respiratory medication;Demonstrates proper use of MDI's  Exhibits compliance with exercise, home and travel O2 prescription;Verbalizes importance of monitoring SPO2 with pulse oximeter and return demonstration;Maintenance of O2 saturations>88%;Exhibits proper breathing techniques, such as pursed lip breathing or other method taught during program session;Compliance with respiratory medication;Demonstrates proper use of MDI's   Goals/Expected Outcomes  -  -  Through exercise at rehab and at home, the patient will decrease shortness of breath with daily activities and feel confident in carrying out an exercise regime at home.   Compliance  Compliance      Oxygen Discharge (Final Oxygen Re-Evaluation): Oxygen Re-Evaluation - 12/06/17 0954      Program Oxygen Prescription   Program Oxygen Prescription  Continuous;E-Tanks    Liters per minute  15    Comments  Pt wears 8 liters at rest (nasal cannula) and 15 liters on exertion      Home Oxygen   Home Oxygen Device  Portable Concentrator;Home  Concentrator    Sleep Oxygen Prescription  Continuous    Liters per minute  6    Home Exercise Oxygen Prescription  Continuous    Liters per minute  15    Home at Rest Exercise Oxygen Prescription  Continuous    Liters per minute  10    Compliance with Home Oxygen Use  No      Goals/Expected Outcomes   Short Term Goals  To learn and exhibit compliance with exercise, home and travel O2 prescription;To learn and understand importance of monitoring SPO2 with pulse oximeter and demonstrate accurate use of the pulse oximeter.;To learn and understand importance of maintaining oxygen saturations>88%;To learn and demonstrate proper pursed lip breathing techniques or other breathing techniques.;To learn and demonstrate proper use of respiratory medications    Long  Term Goals  Exhibits compliance with exercise, home and travel O2 prescription;Verbalizes importance of monitoring SPO2 with pulse oximeter and return demonstration;Maintenance of O2 saturations>88%;Exhibits proper breathing techniques, such as pursed lip breathing or other method taught during program session;Compliance with respiratory medication;Demonstrates proper use of MDI's    Goals/Expected Outcomes  Compliance       Initial Exercise Prescription: Initial Exercise Prescription - 08/12/17 1000  Date of Initial Exercise RX and Referring Provider   Date  08/12/17    Referring Provider  Dr. Rayann Heman      Oxygen   Oxygen  Continuous    Liters  15      NuStep   Level  2    SPM  80    Minutes  17    METs  1.5      Arm Ergometer   Level  2    Watts  10    Minutes  17      Track   Laps  8    Minutes  17      Prescription Details   Frequency (times per week)  2    Duration  Progress to 45 minutes of aerobic exercise without signs/symptoms of physical distress      Intensity   THRR 40-80% of Max Heartrate  69-138    Ratings of Perceived Exertion  11-13    Perceived Dyspnea  0-4      Progression    Progression  Continue progressive overload as per policy without signs/symptoms or physical distress.      Resistance Training   Training Prescription  Yes    Weight  blue bands    Reps  10-15       Perform Capillary Blood Glucose checks as needed.  Exercise Prescription Changes: Exercise Prescription Changes    Row Name 08/30/17 1200 09/13/17 1200 09/27/17 1200 10/11/17 1200 10/25/17 1200     Response to Exercise   Blood Pressure (Admit)  118/70  112/70  124/81  110/78  108/68   Blood Pressure (Exercise)  124/70  108/78  118/60  120/74  100/72   Blood Pressure (Exit)  118/76  110/70  120/60  114/70  130/89   Heart Rate (Admit)  102 bpm  104 bpm  107 bpm  94 bpm  100 bpm   Heart Rate (Exercise)  118 bpm  114 bpm  123 bpm  110 bpm  122 bpm   Heart Rate (Exit)  108 bpm  105 bpm  99 bpm  99 bpm  103 bpm   Oxygen Saturation (Admit)  98 %  97 %  98 %  100 %  98 %   Oxygen Saturation (Exercise)  95 %  94 %  91 %  100 %  98 %   Oxygen Saturation (Exit)  99 %  99 %  99 %  100 %  100 %   Rating of Perceived Exertion (Exercise)  _0 Perceived Dyspnea (Exercise)  _1 1.5   Duration  Progress to 45 minutes of aerobic exercise without signs/symptoms of physical distress  Progress to 45 minutes of aerobic exercise without signs/symptoms of physical distress  Progress to 45 minutes of aerobic exercise without signs/symptoms of physical distress  Progress to 45 minutes of aerobic exercise without signs/symptoms of physical distress  Progress to 45 minutes of aerobic exercise without signs/symptoms of physical distress   Intensity  - 40-80% HRR  THRR unchanged  THRR unchanged  THRR unchanged  THRR unchanged     Progression   Progression  Continue to progress workloads to maintain intensity without signs/symptoms of physical distress.  Continue to progress workloads to maintain intensity without signs/symptoms of physical distress.  Continue to progress workloads to maintain  intensity without signs/symptoms of physical distress.  Continue to progress  workloads to maintain intensity without signs/symptoms of physical distress.  Continue to progress workloads to maintain intensity without signs/symptoms of physical distress.     Resistance Training   Training Prescription  Yes  Yes  Yes  Yes  Yes   Weight  blue bands  blue bands  blue bands  blue bands  blue bands   Reps  10-15  10-15  10-15  10-15  10-15   Time  10 Minutes  10 Minutes  10 Minutes  10 Minutes  10 Minutes     Interval Training   Interval Training  No  No  No  No  No     Oxygen   Oxygen  Continuous  Continuous  Continuous  Continuous  Continuous   Liters  _0 Treadmill   MPH  -  -  -  -  1.5   Grade  -  -  -  -  0   Minutes  -  -  -  -  6     NuStep   Level  _1 SPM  80  80  80  80  80   Minutes  _2 METs  -  1.4  1.4  1.4  1.2     Arm Ergometer   Level  2  2  -  2  2   Watts  10  10  -  -  -   Minutes  17  17  -  17  17     Track   Laps  _3 -  -   Minutes  _4 -  -     Home Exercise Plan   Plans to continue exercise at  -  -  -  -  Home (comment)   Frequency  -  -  -  -  Add 2 additional days to program exercise sessions.   [place] Name 11/01/17 1324 11/22/17 1500           Response to Exercise   Blood Pressure (Admit)  118/70  112/70      Blood Pressure (Exercise)  130/60  104/80      Blood Pressure (Exit)  110/70  102/76      Heart Rate (Admit)  93 bpm  98 bpm      Heart Rate (Exercise)  112 bpm  108 bpm      Heart Rate (Exit)  75 bpm  95 bpm      Oxygen Saturation (Admit)  100 %  97 %      Oxygen Saturation (Exercise)  97 %  97 %      Oxygen Saturation (Exit)  95 %  99 %      Rating of Perceived Exertion (Exercise)  11  12      Perceived Dyspnea (Exercise)  2  1.5      Duration  Progress to 45 minutes of aerobic exercise without signs/symptoms of physical distress  Progress to 45 minutes of aerobic exercise  without signs/symptoms of physical distress      Intensity  THRR unchanged  THRR unchanged        Progression   Progression  Continue to progress workloads to maintain intensity without signs/symptoms of physical distress.  Continue to  progress workloads to maintain intensity without signs/symptoms of physical distress.        Resistance Training   Training Prescription  Yes  Yes      Weight  blue bands  blue bands      Reps  10-15  10-15      Time  10 Minutes  10 Minutes        Interval Training   Interval Training  No  No        Oxygen   Oxygen  Continuous  Continuous      Liters  15  15        Treadmill   MPH  1.5  1.5      Grade  0  0      Minutes  17  17        NuStep   Level  2  2      SPM  80  80      Minutes  17  17      METs  1.4  1.4        Arm Ergometer   Level  -  1      Watts  -  10      Minutes  -  17         Exercise Comments: Exercise Comments    Row Name 10/26/17 1515           Exercise Comments  Home exercise completed          Exercise Goals and Review: Exercise Goals    [place] Name 08/10/17 1444             Exercise Goals   Increase Physical Activity  Yes       Intervention  Provide advice, education, support and counseling about physical activity/exercise needs.;Develop an individualized exercise prescription for aerobic and resistive training based on initial evaluation findings, risk stratification, comorbidities and participant's personal goals.       Expected Outcomes  Short Term: Attend rehab on a regular basis to increase amount of physical activity.;Long Term: Add in home exercise to make exercise part of routine and to increase amount of physical activity.;Long Term: Exercising regularly at least 3-5 days a week.       Increase Strength and Stamina  Yes       Intervention  Provide advice, education, support and counseling about physical activity/exercise needs.;Develop an individualized exercise prescription for aerobic and  resistive training based on initial evaluation findings, risk stratification, comorbidities and participant's personal goals.       Expected Outcomes  Short Term: Increase workloads from initial exercise prescription for resistance, speed, and METs.;Short Term: Perform resistance training exercises routinely during rehab and add in resistance training at home;Long Term: Improve cardiorespiratory fitness, muscular endurance and strength as measured by increased METs and functional capacity (6MWT)       Able to understand and use rate of perceived exertion (RPE) scale  Yes       Intervention  Provide education and explanation on how to use RPE scale       Expected Outcomes  Short Term: Able to use RPE daily in rehab to express subjective intensity level;Long Term:  Able to use RPE to guide intensity level when exercising independently       Able to understand and use Dyspnea scale  Yes       Intervention  Provide education and explanation on how to use Dyspnea scale  Expected Outcomes  Short Term: Able to use Dyspnea scale daily in rehab to express subjective sense of shortness of breath during exertion;Long Term: Able to use Dyspnea scale to guide intensity level when exercising independently       Knowledge and understanding of Target Heart Rate Range (THRR)  Yes       Intervention  Provide education and explanation of THRR including how the numbers were predicted and where they are located for reference       Expected Outcomes  Short Term: Able to state/look up THRR;Long Term: Able to use THRR to govern intensity when exercising independently;Short Term: Able to use daily as guideline for intensity in rehab       Understanding of Exercise Prescription  Yes       Intervention  Provide education, explanation, and written materials on patient's individual exercise prescription       Expected Outcomes  Short Term: Able to explain program exercise prescription;Long Term: Able to explain home exercise  prescription to exercise independently          Exercise Goals Re-Evaluation : Exercise Goals Re-Evaluation    Row Name 08/19/17 1046 09/12/17 0913 10/10/17 0914 11/07/17 1643 12/06/17 0955     Exercise Goal Re-Evaluation   Exercise Goals Review  Increase Physical Activity;Able to understand and use rate of perceived exertion (RPE) scale;Knowledge and understanding of Target Heart Rate Range (THRR);Understanding of Exercise Prescription;Increase Strength and Stamina;Able to understand and use Dyspnea scale;Able to check pulse independently  Increase Physical Activity;Able to understand and use rate of perceived exertion (RPE) scale;Knowledge and understanding of Target Heart Rate Range (THRR);Understanding of Exercise Prescription;Increase Strength and Stamina;Able to understand and use Dyspnea scale  Increase Physical Activity;Able to understand and use rate of perceived exertion (RPE) scale;Knowledge and understanding of Target Heart Rate Range (THRR);Understanding of Exercise Prescription;Increase Strength and Stamina;Able to understand and use Dyspnea scale  Increase Physical Activity;Able to understand and use rate of perceived exertion (RPE) scale;Knowledge and understanding of Target Heart Rate Range (THRR);Understanding of Exercise Prescription;Increase Strength and Stamina;Able to understand and use Dyspnea scale  Increase Physical Activity;Able to understand and use rate of perceived exertion (RPE) scale;Knowledge and understanding of Target Heart Rate Range (THRR);Understanding of Exercise Prescription;Increase Strength and Stamina;Able to understand and use Dyspnea scale   Comments  Patient's first day of exercise will be 08/23/17  Patient has only attended 4 rehab sessions. Will cont. to monitor and progress as able.   Patient's progress is variable. Some days are better than others. She is able to walk on average 7-8 laps (200 ft each) in 15 minutes. Barriers have included sinus issues, high  flow oxygen needs. Will cont to monitor and progress as able.   Patient's progress is variable. Some days are better than others. She is able to walk on average 7-8 laps (200 ft each) in 15 minutes. Barriers have included sinus issues, high flow oxygen needs. Is out of town at this point in search of second opinions. Will cont to monitor and progress as able.   Patient's progress is variable. Some days are better than others. She walks on the treadmill at 1.5 speed with 0% incline. MET level averages 1.4 which places her in a low level.  Barriers have included sinus issues, high flow oxygen needs. Has had recent fall and will be out until she heals. Will cont to monitor and progress as able.    Expected Outcomes  Through exercise at rehab and at home, the  patient will decrease shortness of breath with daily activities and feel confident in carrying out an exercise regime at home.   Through exercise at rehab and at home, the patient will decrease shortness of breath with daily activities and feel confident in carrying out an exercise regime at home.   Through exercise at rehab and at home, the patient will decrease shortness of breath with daily activities and feel confident in carrying out an exercise regime at home.   Through exercise at rehab and at home, the patient will decrease shortness of breath with daily activities and feel confident in carrying out an exercise regime at home.   Through exercise at rehab and at home, the patient will decrease shortness of breath with daily activities and feel confident in carrying out an exercise regime at home.       Discharge Exercise Prescription (Final Exercise Prescription Changes): Exercise Prescription Changes - 11/22/17 1500      Response to Exercise   Blood Pressure (Admit)  112/70    Blood Pressure (Exercise)  104/80    Blood Pressure (Exit)  102/76    Heart Rate (Admit)  98 bpm    Heart Rate (Exercise)  108 bpm    Heart Rate (Exit)  95 bpm     Oxygen Saturation (Admit)  97 %    Oxygen Saturation (Exercise)  97 %    Oxygen Saturation (Exit)  99 %    Rating of Perceived Exertion (Exercise)  12    Perceived Dyspnea (Exercise)  1.5    Duration  Progress to 45 minutes of aerobic exercise without signs/symptoms of physical distress    Intensity  THRR unchanged      Progression   Progression  Continue to progress workloads to maintain intensity without signs/symptoms of physical distress.      Resistance Training   Training Prescription  Yes    Weight  blue bands    Reps  10-15    Time  10 Minutes      Interval Training   Interval Training  No      Oxygen   Oxygen  Continuous    Liters  15      Treadmill   MPH  1.5    Grade  0    Minutes  17      NuStep   Level  2    SPM  80    Minutes  17    METs  1.4      Arm Ergometer   Level  1    Watts  10    Minutes  17       Nutrition:  Target Goals: Understanding of nutrition guidelines, daily intake of sodium <15101m, cholesterol <2050m calories 30% from fat and 7% or less from saturated fats, daily to have 5 or more servings of fruits and vegetables.  Biometrics:    Nutrition Therapy Plan and Nutrition Goals: Nutrition Therapy & Goals - 10/11/17 1214      Nutrition Therapy   Diet  general healthful      Personal Nutrition Goals   Nutrition Goal  Identify food quantities necessary to achieve wt loss of  -2# per week to a goal wt loss of 6-24 lb at graduation from pulmonary rehab.      Intervention Plan   Intervention  Prescribe, educate and counsel regarding individualized specific dietary modifications aiming towards targeted core components such as weight, hypertension, lipid management, diabetes, heart failure and other comorbidities.  Expected Outcomes  Short Term Goal: Understand basic principles of dietary content, such as calories, fat, sodium, cholesterol and nutrients.       Nutrition Assessments: Nutrition Assessments - 10/11/17 1214       Rate Your Plate Scores   Pre Score  56       Nutrition Goals Re-Evaluation:   Nutrition Goals Discharge (Final Nutrition Goals Re-Evaluation):   Psychosocial: Target Goals: Acknowledge presence or absence of significant depression and/or stress, maximize coping skills, provide positive support system. Participant is able to verbalize types and ability to use techniques and skills needed for reducing stress and depression.  Initial Review & Psychosocial Screening: Initial Psych Review & Screening - 08/10/17 1439      Initial Review   Current issues with  Current Stress Concerns    Source of Stress Concerns  Transportation;Occupation      Family Dynamics   [place]?  Yes      Barriers   Psychosocial barriers to participate in program  The patient should benefit from training in stress management and relaxation.      Screening Interventions   Interventions  Encouraged to exercise    Expected Outcomes  Long Term goal: The participant improves quality of Life and PHQ9 Scores as seen by post scores and/or verbalization of changes;Long Term Goal: Stressors or current issues are controlled or eliminated.       Quality of Life Scores:  Scores of 19 and below usually indicate a poorer quality of life in these areas.  A difference of  2-3 points is a clinically meaningful difference.  A difference of 2-3 points in the total score of the Quality of Life Index has been associated with significant improvement in overall quality of life, self-image, physical symptoms, and general health in studies assessing change in quality of life.  PHQ-9: Recent Review Flowsheet Data    Depression screen [facility] 2/9 08/10/2017   Decreased Interest 3   Down, Depressed, Hopeless 1   PHQ - 2 Score 4   Altered sleeping 3   Tired, decreased energy 3   Change in appetite 3   Feeling bad or failure about yourself  3   Trouble concentrating 1   Moving slowly or fidgety/restless 0   Suicidal  thoughts 0   PHQ-9 Score 17   Difficult doing work/chores Very difficult     Interpretation of Total Score  Total Score Depression Severity:  1-4 = Minimal depression, 5-9 = Mild depression, 10-14 = Moderate depression, 15-19 = Moderately severe depression, 20-27 = Severe depression   Psychosocial Evaluation and Intervention: Psychosocial Evaluation - 12/07/17 1904      Psychosocial Evaluation & Interventions   Interventions  Encouraged to exercise with the program and follow exercise prescription;Relaxation education;Stress management education    Comments  Pt completed second opinon and was very happy with the information she was given. Pt is hopeful that she will be able to lose the required weight and be considered for a lung transplant.    Expected Outcomes  Continue to display hopeful and positive and realistic outlook on life    Continue Psychosocial Services   Follow up required by staff       Psychosocial Re-Evaluation: Psychosocial Re-Evaluation    [place] Name 09/12/17 1514 10/11/17 1012 11/09/17 1644 12/07/17 1905       Psychosocial Re-Evaluation   Current issues with  Current Stress Concerns;Current Anxiety/Panic  Current Stress Concerns;Current Anxiety/Panic  Current Stress Concerns;Current Anxiety/Panic  Current Stress  Concerns;Current Anxiety/Panic    Comments  Pt is not driving and does not feel independent.  this is a source of her stress and anxiety.  Pt is uncertain of what her future will look like.    Pt is not driving and does not feel independent.  this is a source of her stress and anxiety.  Pt is uncertain of what her future will look like.    Pt is not driving and does not feel independent.  this is a source of her stress and anxiety.  Pt is uncertain of what her future will look like but is more hopeful than she has been in a long time  Pt is not driving and does not feel independent.  this continues to be a source of her stress and anxiety. Pt feels she can  physically drive but does not trust herself.  Hopefully in time she will.  Pt is uncertain of what her future but for the first time feels hopeful and optimistic    Interventions  Stress management education;Encouraged to attend Pulmonary Rehabilitation for the exercise;Relaxation education  Stress management education;Encouraged to attend Pulmonary Rehabilitation for the exercise;Relaxation education  Stress management education;Encouraged to attend Pulmonary Rehabilitation for the exercise;Relaxation education  Stress management education;Encouraged to attend Pulmonary Rehabilitation for the exercise;Relaxation education      Initial Review   Source of Stress Concerns  Transportation;Occupation  Transport planner;Occupation  -  -       Psychosocial Discharge (Final Psychosocial Re-Evaluation): Psychosocial Re-Evaluation - 12/07/17 1905      Psychosocial Re-Evaluation   Current issues with  Current Stress Concerns;Current Anxiety/Panic    Comments  Pt is not driving and does not feel independent.  this continues to be a source of her stress and anxiety. Pt feels she can physically drive but does not trust herself.  Hopefully in time she will.  Pt is uncertain of what her future but for the first time feels hopeful and optimistic    Interventions  Stress management education;Encouraged to attend Pulmonary Rehabilitation for the exercise;Relaxation education       Education: Education Goals: Education classes will be provided on a weekly basis, covering required topics. Participant will state understanding/return demonstration of topics presented.  Learning Barriers/Preferences: Learning Barriers/Preferences - 08/10/17 1441      Learning Barriers/Preferences   Learning Barriers  Sight    Learning Preferences  Computer/Internet;Group Instruction;Individual Instruction;Written Material       Education Topics: Risk Factor Reduction:  -Group instruction that is supported by a PowerPoint  presentation. Instructor discusses the definition of a risk factor, different risk factors for pulmonary disease, and how the heart and lungs work together.     Nutrition for Pulmonary Patient:  -Group instruction provided by PowerPoint slides, verbal discussion, and written materials to support subject matter. The instructor gives an explanation and review of healthy diet recommendations, which includes a discussion on weight management, recommendations for fruit and vegetable consumption, as well as protein, fluid, caffeine, fiber, sodium, sugar, and alcohol. Tips for eating when patients are short of breath are discussed.   PULMONARY REHAB OTHER RESPIRATORY from 11/24/2017 in [place]  Date  10/20/17  Educator  Rodman Pickle  Instruction Review Code  2- Demonstrated Understanding      Pursed Lip Breathing:  -Group instruction that is supported by demonstration and informational handouts. Instructor discusses the benefits of pursed lip and diaphragmatic breathing and detailed demonstration on how to preform both.  Oxygen Safety:  -Group instruction provided by PowerPoint, verbal discussion, and written material to support subject matter. There is an overview of "What is Oxygen" and "Why do we need it".  Instructor also reviews how to create a safe environment for oxygen use, the importance of using oxygen as prescribed, and the risks of noncompliance. There is a brief discussion on traveling with oxygen and resources the patient may utilize.   PULMONARY REHAB OTHER RESPIRATORY from 11/24/2017 in [place]  Date  11/24/17  Educator  Cloyde Reams  Instruction Review Code  1- Verbalizes Understanding      Oxygen Equipment:  -Group instruction provided by [employer] utilizing handouts, written materials, and Insurance underwriter.   Signs and Symptoms:  -Group instruction provided by written material and verbal discussion to  support subject matter. Warning signs and symptoms of infection, stroke, and heart attack are reviewed and when to call the physician/911 reinforced. Tips for preventing the spread of infection discussed.   PULMONARY REHAB OTHER RESPIRATORY from 11/24/2017 in [place]  Date  09/01/17  Educator  RN  Instruction Review Code  1- Verbalizes Understanding      Advanced Directives:  -Group instruction provided by verbal instruction and written material to support subject matter. Instructor reviews Advanced Directive laws and proper instruction for filling out document.   Pulmonary Video:  -Group video education that reviews the importance of medication and oxygen compliance, exercise, good nutrition, pulmonary hygiene, and pursed lip and diaphragmatic breathing for the pulmonary patient.   Exercise for the Pulmonary Patient:  -Group instruction that is supported by a PowerPoint presentation. Instructor discusses benefits of exercise, core components of exercise, frequency, duration, and intensity of an exercise routine, importance of utilizing pulse oximetry during exercise, safety while exercising, and options of places to exercise outside of rehab.     PULMONARY REHAB OTHER RESPIRATORY from 11/24/2017 in [place]  Date  09/22/17  Educator  Cloyde Reams  Instruction Review Code  1- Verbalizes Understanding      Pulmonary Medications:  -Verbally interactive group education provided by instructor with focus on inhaled medications and proper administration.   PULMONARY REHAB OTHER RESPIRATORY from 11/24/2017 in [place]  Date  11/01/17  Educator  pharm      Anatomy and Physiology of the Respiratory System and Intimacy:  -Group instruction provided by PowerPoint, verbal discussion, and written material to support subject matter. Instructor reviews respiratory cycle and anatomical components of the  respiratory system and their functions. Instructor also reviews differences in obstructive and restrictive respiratory diseases with examples of each. Intimacy, Sex, and Sexuality differences are reviewed with a discussion on how relationships can change when diagnosed with pulmonary disease. Common sexual concerns are reviewed.   PULMONARY REHAB OTHER RESPIRATORY from 11/24/2017 in [place]  Date  10/13/17  Educator  rn  Instruction Review Code  2- Demonstrated Understanding      MD DAY -A group question and answer session with a medical doctor that allows participants to ask questions that relate to their pulmonary disease state.   PULMONARY REHAB OTHER RESPIRATORY from 11/24/2017 in [place]  Date  09/27/17  Educator  yacoub  Instruction Review Code  2- Demonstrated Understanding      OTHER EDUCATION -Group or individual verbal, written, or video instructions that support the educational goals of the pulmonary rehab program.  Holiday Eating Survival Tips:  -Group instruction provided by PowerPoint slides, verbal discussion, and written materials to support subject matter. The instructor gives patients tips, tricks, and techniques to help them not only survive but enjoy the holidays despite the onslaught of food that accompanies the holidays.   Knowledge Questionnaire Score:   Core Components/Risk Factors/Patient Goals at Admission: Personal Goals and Risk Factors at Admission - 08/10/17 1433      Core Components/Risk Factors/Patient Goals on Admission    Weight Management  Obesity;Weight Loss    Intervention  Weight Management: Develop a combined nutrition and exercise program designed to reach desired caloric intake, while maintaining appropriate intake of nutrient and fiber, sodium and fats, and appropriate energy expenditure required for the weight goal.;Weight Management: Provide education and appropriate  resources to help participant work on and attain dietary goals.;Weight Management/Obesity: Establish reasonable short term and long term weight goals.    Admit Weight  243 lb 6.2 oz (110.4 kg)    Goal Weight: Short Term  200 lb (90.7 kg)    Goal Weight: Long Term  175 lb (79.4 kg)    Expected Outcomes  Short Term: Continue to assess and modify interventions until short term weight is achieved;Long Term: Adherence to nutrition and physical activity/exercise program aimed toward attainment of established weight goal;Weight Loss: Understanding of general recommendations for a balanced deficit meal plan, which promotes 1-2 lb weight loss per week and includes a negative energy balance of [phone] kcal/d;Understanding recommendations for meals to include 15-35% energy as protein, 25-35% energy from fat, 35-60% energy from carbohydrates, less than 251m of dietary cholesterol, 20-35 gm of total fiber daily;Understanding of distribution of calorie intake throughout the day with the consumption of 4-5 meals/snacks    Improve shortness of breath with ADL's  Yes    Intervention  Provide education, individualized exercise plan and daily activity instruction to help decrease symptoms of SOB with activities of daily living.    Expected Outcomes  Short Term: Improve cardiorespiratory fitness to achieve a reduction of symptoms when performing ADLs;Long Term: Be able to perform more ADLs without symptoms or delay the onset of symptoms    Diabetes  Yes    Intervention  Provide education about proper nutrition, including hydration, and aerobic/resistive exercise prescription along with prescribed medications to achieve blood glucose in normal ranges: Fasting glucose 65-99 mg/dL;Provide education about signs/symptoms and action to take for hypo/hyperglycemia.    Expected Outcomes  Short Term: Participant verbalizes understanding of the signs/symptoms and immediate care of hyper/hypoglycemia, proper foot care and importance  of medication, aerobic/resistive exercise and nutrition plan for blood glucose control.;Long Term: Attainment of HbA1C < 7%.    Stress  Yes    Intervention  Offer individual and/or small group education and counseling on adjustment to heart disease, stress management and health-related lifestyle change. Teach and support self-help strategies.;Refer participants experiencing significant psychosocial distress to appropriate mental health specialists for further evaluation and treatment. When possible, include family members and significant others in education/counseling sessions.    Expected Outcomes  Short Term: Participant demonstrates changes in health-related behavior, relaxation and other stress management skills, ability to obtain effective social support, and compliance with psychotropic medications if prescribed.;Long Term: Emotional wellbeing is indicated by absence of clinically significant psychosocial distress or social isolation.       Core Components/Risk Factors/Patient Goals Review:  Goals and Risk Factor Review    Row Name 09/12/17 1335 09/14/17 1147 10/11/17 1012 11/09/17 1645 12/07/17 1906  Core Components/Risk Factors/Patient Goals Review   Personal Goals Review  Weight Management/Obesity;Develop more efficient breathing techniques such as purse lipped breathing and diaphragmatic breathing and practicing self-pacing with activity.;Diabetes;Improve shortness of breath with ADL's;Increase knowledge of respiratory medications and ability to use respiratory devices properly.;Stress  -  Weight Management/Obesity;Develop more efficient breathing techniques such as purse lipped breathing and diaphragmatic breathing and practicing self-pacing with activity.;Improve shortness of breath with ADL's;Increase knowledge of respiratory medications and ability to use respiratory devices properly.;Stress  Weight Management/Obesity;Develop more efficient breathing techniques such as purse lipped  breathing and diaphragmatic breathing and practicing self-pacing with activity.;Improve shortness of breath with ADL's;Increase knowledge of respiratory medications and ability to use respiratory devices properly.;Stress  Weight Management/Obesity;Develop more efficient breathing techniques such as purse lipped breathing and diaphragmatic breathing and practicing self-pacing with activity.;Improve shortness of breath with ADL's;Increase knowledge of respiratory medications and ability to use respiratory devices properly.;Stress   Review  Pt off to a great start.  Pt has completed 4 exercise sessions since 08/30/17.  Pt weight shows increase from 107.2 kg to 108.7 kg.  Pt has not engaged in home exerice on her days off due to deconditioning and fear of not knowing what she can do safely on her own.  Will plan to have the Exercise Physiologist talk with pt regarding home exercise.  Pt continue to feel stressed with her lung disease and the uncertainity of what is to come.  Pt has attened oxygen safety class and demonstrates a good working knowledge of her pendulant cannula.  Pt observed having good PLB techniques.   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  Will resolve this as a patient goal.  Pt off to a great start.  Pt has completed 4 exercise sessions since 08/30/17.  Pt weight shows increase from 107.2 kg to 108.7 kg.  Pt has not engaged in home exerice on her days off due to deconditioning and fear of not knowing what she can do safely on her own.  Will plan to have the Exercise Physiologist talk with pt regarding home exercise.  Pt continue to feel stressed with her lung disease and the uncertainity of what is to come.  Pt has attened oxygen safety class and demonstrates a good working knowledge of her pendulant cannula.  Pt observed having good PLB techniques.   Pt workloads show arm crank level 2, nustep level 2 and track averages 8-10 laps. 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  Will resolve this as a patient goal.   Pt has completed 10 exercise sessions since 08/30/17.  Pt weight shows dcrease from 107.2 kg to 106.5kg.  Dietician to see her regarding weight loss. 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   EP met with pt on 8/1 to talk about home exercise. Pt continue to feel stressed with her lung disease and the uncertainity of what is to come particularly since she received the news she has 5 months.  Pt has attended meditation and mindfulness class.  Continue to support pt in her efforts. Pt observed having good PLB techniques.   Pt workloads show arm crank level 2, nustep level 4 and track averages 15 laps. 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  Will resolve this as a patient goal.   Pt has completed 17 exercise sessions since 08/30/17.  Pt weight shows decrease 4 kg.  Dietician to see her regarding weight loss and she is doing a high protein low carbohydrate diet. Pt knows she needs to lose weight in order to be considered for lung transplant.  This has been difficult for pt due to steriod therapy .  Pt is in [facility] for second opinon and to see if she qualifies for clinical trials for ILD.  Pt observed having good PLB techniques.   Pt workloads show arm crank level 2, nustep level 4 and track averages 15 laps. Await pt return to exercise.  Continue to monitor pt progress during the next 30 day assessment.   Pt has completed 21 exercise sessions and 10 education classes since 08/30/17.  Pt weight shows decrease 3.2 kg which is slightly up from previous 4 kg loss.  Met with the dietician  regarding weight loss and she is now doing a high protein low  carbohydrate diet. Pt knows she needs to lose weight in order to be considered for lung transplant.  This has been difficult for pt due to steriod therapy .  Pt has returned from [place] and feels optimistic about her future.  [place]  Pt observed having good PLB techniques and oximizer   Pt workloads show arm crank level increse to level 4, nustep level 2 and  now on the treadmill at 1.8. Continue to monitor pt progress during the next 30 day assessment.   Expected Outcomes  Pt will show progress toward weight loss so that she will be able to be eligible for placement for lung transplant.    Stressors will be  eliminated and/or  will report appropriate and healthy coping skills to deal with stressors.  pt will use proper teechnique of diaphragmatic breathing and PLB which will improve her shortness of breath  See Admission Goals/Outcomes  See Admission Goals/Outcomes  See Admission Goals/Outcomes  See Admission Goals/Outcomes      Core Components/Risk Factors/Patient Goals at Discharge (Final Review):  Goals and Risk Factor Review - 12/07/17 1906      Core Components/Risk Factors/Patient Goals Review   Personal Goals Review  Weight Management/Obesity;Develop more efficient breathing techniques such as purse lipped breathing and diaphragmatic breathing and practicing self-pacing with activity.;Improve shortness of breath with ADL's;Increase knowledge of respiratory medications and ability to use respiratory devices properly.;Stress    Review   Pt has completed 21 exercise sessions and 10 education classes since 08/30/17.  Pt weight shows decrease 3.2 kg which is slightly up from previous 4 kg loss.  Met with the dietician  regarding weight loss and she is now doing a high protein low carbohydrate diet. Pt knows she needs to lose weight in order to be considered for lung transplant.  This has been difficult for pt due to steriod therapy .  Pt  has returned from [place] and feels optimistic about her future.  Pt [place]  Pt observed having good PLB techniques and oximizer   Pt workloads show arm crank level increse to level 4, nustep level 2 and  now on the treadmill at 1.8. Continue to monitor pt progress during  the next 30 day assessment.    Expected Outcomes  See Admission Goals/Outcomes       ITP Comments: ITP Comments    Row Name 08/10/17 1359 09/12/17 1453 10/12/17 0931 11/09/17 1642 12/07/17 1904   ITP Comments  Dr. Jennet Maduro, Medical Director  Dr. Jennet Maduro, Medical Director  Dr. Jennet Maduro, Medical Director  Dr. Jennet Maduro, Medical Director Pulmonary Rehab  Dr. Jennet Maduro, Medical Director Pulmonary Rehab      Comments: Pt has completed 21 exercise sessions. Continue to monitor. Cherre Huger, BSN Cardiac and Training and development officer

## 2017-12-13 ENCOUNTER — Encounter (HOSPITAL_COMMUNITY)
Admission: RE | Admit: 2017-12-13 | Discharge: 2017-12-13 | Disposition: A | Discharge status: Home / Self Care | Payer: BLUE CROSS/BLUE SHIELD | Source: Ambulatory Visit | Attending: Internal Medicine | Admitting: Internal Medicine

## 2017-12-13 DIAGNOSIS — J849 Interstitial pulmonary disease, unspecified: Secondary | ICD-10-CM | POA: Diagnosis not present

## 2017-12-13 NOTE — Progress Notes (Signed)
Daily Session Note  Patient Details  Name: Tasha Ballard MRN: [id number] Date of Birth: November 29, 1970 Referring Provider:     Pulmonary Rehab Walk Test from 08/11/2017 in [place]  Referring Provider  Dr. Rayann Heman ]      Encounter Date: 12/13/2017  Check In: Session Check In - 12/13/17 1137      Check-In   Supervising physician immediately available to respond to emergencies  [facility] immediately available    Physician(s)  Dr. Ree Kida    Location  [facility]    Staff Present  Su Hilt, MS, ACSM RCEP, Exercise Physiologist;Ramy Greth Kris Mouton, MS, Exercise Physiologist;Lisa Ysidro Evert, Felipe Drone, RN, MHA;Carlette Wilber Oliphant, RN, BSN    Medication changes reported      No    Fall or balance concerns reported     No    Tobacco Cessation  No Change    Warm-up and Cool-down  Performed as group-led instruction    Resistance Training Performed  Yes    VAD Patient?  No    PAD/SET Patient?  No      Pain Assessment   Currently in Pain?  No/denies    Multiple Pain Sites  No       Capillary Blood Glucose: No results found for this or any previous visit (from the past 24 hour(s)).    Social History   Tobacco Use  Smoking Status Never Smoker  Smokeless Tobacco Never Used    Goals Met:  Exercise tolerated well  Goals Unmet:  Not Applicable  Comments: Service time is from 10:30 AM to 12:00 PM    Dr. Rush Farmer is Medical Director for Pulmonary Rehab at [facility].

## 2017-12-15 ENCOUNTER — Encounter (HOSPITAL_COMMUNITY)
Admission: RE | Admit: 2017-12-15 | Discharge: 2017-12-15 | Disposition: A | Discharge status: Home / Self Care | Payer: BLUE CROSS/BLUE SHIELD | Source: Ambulatory Visit | Attending: Pulmonary Disease | Admitting: Pulmonary Disease

## 2017-12-15 DIAGNOSIS — J849 Interstitial pulmonary disease, unspecified: Secondary | ICD-10-CM

## 2017-12-15 NOTE — Progress Notes (Signed)
Daily Session Note  Patient Details  Name: Tasha Ballard MRN: [id number] Date of Birth: [birth year] Referring Provider:     Pulmonary Rehab Walk Test from 08/11/2017 in [place]  Referring Provider  Dr. Rayann Heman ]      Encounter Date: 12/15/2017  Check In: Session Check In - 12/15/17 1234      Check-In   Supervising physician immediately available to respond to emergencies  [facility] immediately available    Physician(s)  Dr. Tyrell Antonio    Location  [facility]    Staff Present  Su Hilt, MS, ACSM RCEP, Exercise Physiologist;Dalton Kris Mouton, MS, Exercise Physiologist;Lisa Ysidro Evert, Felipe Drone, RN, MHA;Carlette Wilber Oliphant, RN, BSN    Medication changes reported      No    Fall or balance concerns reported     No    Tobacco Cessation  No Change    Warm-up and Cool-down  Performed as group-led instruction    Resistance Training Performed  Yes    VAD Patient?  No    PAD/SET Patient?  No      Pain Assessment   Currently in Pain?  No/denies    Multiple Pain Sites  No       Capillary Blood Glucose: No results found for this or any previous visit (from the past 24 hour(s)).    Social History   Tobacco Use  Smoking Status Never Smoker  Smokeless Tobacco Never Used    Goals Met:  Proper associated with RPD/PD & O2 Sat Exercise tolerated well  Goals Unmet:  Not Applicable  Comments: Service time is from 1030 to 1230   Dr. Rush Farmer is Medical Director for Pulmonary Rehab at [facility].

## 2017-12-19 ENCOUNTER — Telehealth: Payer: Self-pay | Admitting: Internal Medicine

## 2017-12-19 NOTE — Telephone Encounter (Signed)
Called Dr Elisabeth Cara - [facility]  1. He is advising weight loss - with or without bypass  2. 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 Brand Males, M.D., F.C.C.P,  Pulmonary and Critical Care Medicine Staff Physician, [place] Director - Interstitial Lung Disease  Program  Pulmonary [place] at [place], [place], [address]  Pager: [phone], If no answer or between  15:00h - 7:00h: call [phone] Telephone: [phone]  5:40 PM 12/19/2017

## 2017-12-20 ENCOUNTER — Encounter: Payer: BLUE CROSS/BLUE SHIELD | Admitting: *Deleted

## 2017-12-20 ENCOUNTER — Encounter (HOSPITAL_COMMUNITY)
Admission: RE | Admit: 2017-12-20 | Discharge: 2017-12-20 | Disposition: A | Discharge status: Home / Self Care | Payer: BLUE CROSS/BLUE SHIELD | Source: Ambulatory Visit | Attending: Internal Medicine | Admitting: Internal Medicine

## 2017-12-20 ENCOUNTER — Ambulatory Visit (INDEPENDENT_AMBULATORY_CARE_PROVIDER_SITE_OTHER): Payer: BLUE CROSS/BLUE SHIELD | Admitting: Internal Medicine

## 2017-12-20 ENCOUNTER — Telehealth: Payer: Self-pay | Admitting: Internal Medicine

## 2017-12-20 VITALS — Wt 227.1 lb

## 2017-12-20 VITALS — BP 118/70 | HR 109

## 2017-12-20 DIAGNOSIS — J8489 Other specified interstitial pulmonary diseases: Secondary | ICD-10-CM

## 2017-12-20 DIAGNOSIS — G4700 Insomnia, unspecified: Secondary | ICD-10-CM

## 2017-12-20 DIAGNOSIS — J9611 Chronic respiratory failure with hypoxia: Secondary | ICD-10-CM | POA: Diagnosis not present

## 2017-12-20 DIAGNOSIS — J84112 Idiopathic pulmonary fibrosis: Secondary | ICD-10-CM

## 2017-12-20 DIAGNOSIS — J849 Interstitial pulmonary disease, unspecified: Secondary | ICD-10-CM | POA: Diagnosis not present

## 2017-12-20 DIAGNOSIS — Z006 Encounter for examination for normal comparison and control in clinical research program: Secondary | ICD-10-CM

## 2017-12-20 DIAGNOSIS — Z6841 Body Mass Index (BMI) 40.0 and over, adult: Secondary | ICD-10-CM

## 2017-12-20 NOTE — Progress Notes (Signed)
Daily Session Note  Patient Details  Name: Tasha Ballard MRN: [id number] Date of Birth: 1970/10/29 Referring Provider:     Pulmonary Rehab Walk Test from 08/11/2017 in [place]  Referring Provider  Dr. Rayann Heman ]      Encounter Date: 12/20/2017  Check In: Session Check In - 12/20/17 1200      Check-In   Supervising physician immediately available to respond to emergencies  [facility] immediately available    Physician(s)  Dr. Tyrell Antonio    Location  [facility]    Staff Present  Su Hilt, MS, ACSM RCEP, Exercise Physiologist;Dalton Kris Mouton, MS, Exercise Physiologist;Lisa Ysidro Evert, Felipe Drone, RN, MHA;Carlette Wilber Oliphant, RN, BSN    Medication changes reported      No    Fall or balance concerns reported     No    Tobacco Cessation  No Change    Warm-up and Cool-down  Performed as group-led instruction    Resistance Training Performed  Yes    VAD Patient?  No    PAD/SET Patient?  No      Pain Assessment   Currently in Pain?  No/denies    Multiple Pain Sites  No       Capillary Blood Glucose: No results found for this or any previous visit (from the past 24 hour(s)).    Social History   Tobacco Use  Smoking Status Never Smoker  Smokeless Tobacco Never Used    Goals Met:  Personal goals reviewed  Goals Unmet:  Not Applicable  Comments: Service time is from 10:30a to 12:00p    Dr. Rush Farmer is Medical Director for Pulmonary Rehab at [facility].

## 2017-12-20 NOTE — Progress Notes (Signed)
PCP System, Pcp Not In   HPI  IOV 11/22/2017  Chief Complaint  Patient presents with  . Consult    consut due to pulmonary fibrosis. Pt has been followed by [facility] and last PFT was peformed 8/14 at [facility]. Pt does have complaints of SOB with exertion which she states has become better due to rehab. Pt also has a dry hacking cough with mild production of mucus. Denies any CP.    Is seen by presents to the ILD clinic.  This is her first visit with [name].  History is obtained from her and review of the [employer] where she is to follow-up with Dr. Hortencia Pilar.  Interstitial lung disease clinic integrated interstitial lung disease questionnaire: Symptoms: Shortness of breath for years.  Gradually worse.  Currently level 5 shortness of breath walking up stairs or walking up a hill with oxygen.  Level for shortness of breath for sweeping or vacuuming and level 3 shortness of breath for shopping or picking up things and doing laundry.  Level 3 shortness of breath or standing up from a chair or taking a shower making the bed and level 1 dyspnea for brushing teeth.  She also has a cough for 6 years which is getting worse.  On and off that is phlegm.  Occasional wheezing present there is associated fatigue.  She tells me that she had undiagnosed and unrecognized ILD for years.  This was then picked up at [facility] and then she got referred to [facility].  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  At this time she was placed on CellCept.  Then in 2016 she got somewhat worse and was placed on 2 L nasal cannula continuously.  In 2017 she tells me that she had a aortic aneurysm and this was repaired at [facility].  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  And then for the last year and a half or so she has been on significantly worsening hypoxemia.  In  the spring 2019 she is requiring 8 L at rest and 15 L at exertion.  Apparently Dr. Lauris Chroman then recommended Rituxan infusions which she got to 60 days apart with the last one being approximately in April of May 2019.  After that she is somewhat improved requiring only 9-10 L with exertion.  She has been attending pulmonary rehabilitation which has been helping.  She is obese and unable to lose weight although she is lost some.  She believes a lot of her oxygen needs might be related to deviated nasal septum/septal perforation.  She subsequently followed up with Dr. Ruthann Cancer at [facility] in the summer 2019.  I reviewed the notes.  Palliative care was recommended because of progressive respiratory failure.  She has never seen the transplant clinic at [facility] but she recommends that because of obesity and diaphragmatic dysfunction that she would not be a good candidate for transplant .  She was then frustrated with experience because she wants to fight the disease.  She then drove to [facility] and saw Dr. Elisabeth Cara at [facility] 1 week ago.  She spent substantial number of days there.  At this point in time she showed me an email from him that suggest he might have myositis-ILD but they would like to review her CT scans and pathology from [facility]  Apparently she  had significant amount of blood work and other tests at [facility]  I do not have access to these records at this point.  She believes that she wants to undergo transplant.  She thinks that she would be able to have transplant in [place] despite a high BMI.  Currently in [place] she is somewhat stable attending pulmonary rehabilitation.  She is interested in pulmonary trials  Past medical history  -Tested for sleep apnea at [facility] and was told to be negative.  But at [facility] in September 2019 they recommended a repeat sleep study -Denied all forms of connective tissue disease diagnosis and vasculitis.   Although at [facility] September 2019 when she was seen last week she was told she might have myositis-ILD but this is pending confirmation in the multidisciplinary case conference -She does report positive for diabetes and thyroid disease and mononucleosis in the past but denies any tuberculosis or kidney disease   Review of systems -Positive for fatigue for a few years, arthralgia for a few years but denies dysphagia.  Positive for dry eyes and mouth for few years.  No Raynaud.  No recurrent fever.  No weight loss.  No acid reflux but does admit to snoring for a few years.  No rash no ulcers.  Family history of lung disease COPD and a long but no pulmonary fibrosis.  Dad did have rheumatoid arthritis  Personal exposure history Denies tobacco, vaping, marijuana, cocaine, IV drugs  Home and hobby history  [place] single[place]family home which is [place] years old  Occupational history -She does stay in a condition spaces.  122 point occupational questionnaire completely negative  Medication history -She has a history of allergy to Macrobid/nitrofurantoin.  Do not know when she took it.  She is currently on immunosuppressive regimen of CellCept, Bactrim and prednisone.  CellCept since 2013 and prednisone since 2012.  She did get Rituxan x2 doses with the last one being in spring 2019.  She feels her hypoxemia improved somewhat after that.     has a past medical history of Aneurysm ([place]), Diabetes mellitus without complication ([place]), and Thyroid disease.   reports that she has never smoked. She has never used smokeless tobacco.   OV 12/21/2017  Subjective:  Patient ID: Tasha Ballard, female , DOB: 1970/05/17 , age 47 y.o. , MRN: [id number] , ADDRESS: [address] [place] [address]   12/21/2017 -   Chief Complaint  Patient presents with  . Follow-up    ILD     HPI SANGEETA YOUSE 47 y.o. -returns for follow-up.  She brings her mother along who I am meeting for the  first time.  This visit is specifically focused on evaluation from [place].  Yesterday I did speak to the interstitial lung disease specialist at [facility] Dr. Threasa Alpha.  He told me he was finally able to receive and review the surgical lung biopsy from [facility].  He and his pathologist agreed with the diagnosis of progressive NSIP.  His main recommendation was to switch patient to Cytoxan.  In addition also aggressively pursue weight loss even if it means for bariatric surgery approach so we can get ready for transplant.  He agreed the prognosis is very poor.  At this point in time patient compared to the last visit is stable.  She is using more than 10 L of oxygen up to 15 L of oxygen.  She is interested in Cytoxan.  Dr.  Elisabeth Cara indicated to me that efficacy wise oral and IV are similar but he felt IV would be a little bit safer from a urologic standpoint.  Patient is interested in weight loss.  She says she has had difficulty losing weight despite being hypocaloric.  She is interested in nutritional referral.  In addition she wants a sleep specialist referral.  She says [place] OSA was ruled out.  She has significant insomnia and daytime tiredness.  She said that [place] recommended sleep referral ASAP.  She is registered to participate in the ILD-pro registry study for progressive non-IPF disease.  This was done today.   Investigations at [facility] -November 09, 2017: Echocardiogram ejection fraction 70-75% with grade 2 diastolic dysfunction.  Right ventricle was mildly enlarged but global RV function was felt to be normal.  No pericardial effusion no obvious right-to-left shunt at rest or cough or Valsalva on agitated saline contrast exam and stage I right ventricular diastolic dysfunction.  Pulmonary artery systolic pressure at 45 mmHg  -Lung function November 08, 2017: FVC 0.9 L / 27%, DLCO 5.86/29%  - HRCT 11/08/17 Rt diaphragm elevation ? Eventration  v paresis. Sniff test recommendd  Esophagogram November 11, 2017 - > 11/11/17 Natil Jewsh - Normal    ROS - per HPI     has a past medical history of Aneurysm ([place]), Diabetes mellitus without complication ([place]), and Thyroid disease.   reports that she has never smoked. She has never used smokeless tobacco.  No past surgical history on file.  Allergies  Allergen Reactions  . Macrobid [Nitrofurantoin Macrocrystal] Nausea And Vomiting    Immunization History  Administered Date(s) Administered  . Influenza,inj,Quad PF,6+ Mos 12/24/2014, 02/05/2016, 01/07/2017, 11/22/2017  . Pneumococcal Conjugate-13 07/20/2017  . Pneumococcal Polysaccharide-23 06/09/2012    No family history on file.   Current Outpatient Medications:  .  acyclovir (ZOVIRAX) 400 MG tablet, Take 400 mg by mouth 3 (three) times daily., Disp: , Rfl:  .  apixaban (ELIQUIS) 5 MG TABS tablet, Take 5 mg by mouth 2 (two) times daily., Disp: , Rfl:  .  benzonatate (TESSALON) 100 MG capsule, Take by mouth 3 (three) times daily as needed for cough., Disp: , Rfl:  .  carvedilol (COREG) 6.25 MG tablet, Take 6.25 mg by mouth 2 (two) times daily with a meal., Disp: , Rfl:  .  cholecalciferol (VITAMIN D) 400 units TABS tablet, Take 800 Units by mouth. Pt takes 1,000 units daily, Disp: , Rfl:  .  furosemide (LASIX) 40 MG tablet, Take 40 mg by mouth daily. 40 mg daily but on days of swelling twice a day, Disp: , Rfl:  .  metFORMIN (GLUCOPHAGE) 500 MG tablet, Take by mouth 2 (two) times daily with a meal., Disp: , Rfl:  .  montelukast (SINGULAIR) 10 MG tablet, Take 10 mg by mouth daily after supper., Disp: , Rfl:  .  mycophenolate (CELLCEPT) 500 MG tablet, Take 1,500 mg by mouth 2 (two) times daily. Takes 1 500 mg twice a day, Disp: , Rfl:  .  predniSONE (DELTASONE) 10 MG tablet, Take 1 tablet (10 mg total) by mouth daily with breakfast., Disp: 90 tablet, Rfl: 3 .  predniSONE (DELTASONE) 10 MG tablet, Take 10-64m as needed for  SOB, Disp: 30 tablet, Rfl: 3 .  spironolactone (ALDACTONE) 100 MG tablet, Take 100 mg by mouth daily., Disp: , Rfl:  .  sulfamethoxazole-trimethoprim (BACTRIM) 400-80 MG tablet, Once a day while on prednisone, Disp: 90 tablet, Rfl: 1 .  Sulfamethoxazole-Trimethoprim (SEPTRA DS PO), Take by mouth daily. 400-80  Once a day while on prednisone, Disp: , Rfl:  .  thyroid (ARMOUR) 30 MG tablet, Take 60 mg by mouth daily before breakfast. Takes 60 mg tablet, Disp: , Rfl:       Objective:   Vitals:   12/20/17 1618  BP: 118/70  Pulse: (!) 109  SpO2: 92%    Estimated body mass index is 41.53 kg/m as calculated from the following:   Height as of 11/24/17: _0  (1.575 m).   Weight as of an earlier encounter on 12/20/17: 227 lb 1.2 oz (103 kg).  _1 @  There were no vitals filed for this visit.   Physical Exam Discussion only visit        Assessment:       ICD-10-CM   1. NSIP (nonspecific interstitial pneumonia) ([place]) J84.89   2. Interstitial pulmonary disease (HCC) J84.9 AMB referral to Pulmonary Rehabilitation Maintenance Program ([place] Only)  3. Chronic respiratory failure with hypoxia (HCC) J96.11   4. Insomnia, unspecified type G47.00   5. Class 3 severe obesity with serious comorbidity and body mass index (BMI) of 40.0 to 44.9 in adult, unspecified obesity type ([place]) E66.01    Z68.41        Plan:     Patient Instructions  NSIP (nonspecific interstitial pneumonia) ([place]) Interstitial pulmonary disease ([place]) - Plan: AMB referral to Pulmonary Rehabilitation Maintenance Program ([facility] Only) Chronic respiratory failure with hypoxia (HCC)  - Progressive  - Thanks for starting the ILD PRO registry study - continue o2 - continue maintenance rehab  - Please talk to PCP System, Pcp Not In -  and ensure you get  shingrix (GSK) inactivated vaccine against shingles - need to start CYTOXAN asap - this week oral and as soon as IV is figured out start IV  - if you do not hear  from me next few days on this please email me   Insomnia, unspecified type - refer one of our sleep docs; to be seen asap next few weeks  Obesity  - can refer to Dr Kaylyn Lim of [facility] to discuss weight loss surgery if you are interested - have messaged hospital dietician to see where best we can get you into a hight quality progra      > 50% of this > 40 min visit spent in face to face counseling or/and coordination of care - by this undersigned MD - Dr Brand Males. This includes one or more of the following documented above: discussion of test results, diagnostic or treatment recommendations, prognosis, risks and benefits of management options, instructions, education, compliance or risk-factor reduction   SIGNATURE    Dr. Brand Males, M.D., F.C.C.P,  Pulmonary and Critical Care Medicine Staff Physician, [place] Director - Interstitial Lung Disease  Program  Pulmonary [place] at [place], [place], [address]  Pager: [phone], If no answer or between  15:00h - 7:00h: call [phone] Telephone: [phone]  2:21 PM 12/21/2017

## 2017-12-20 NOTE — Telephone Encounter (Signed)
Order has been placed for pulm rehab maintenance program.  Attempted to call Lattie Haw with [facility] pulm rehab but unable to reach her. Left a detailed message for her to let her know that this had been done. Nothing further needed.

## 2017-12-20 NOTE — Patient Instructions (Addendum)
NSIP (nonspecific interstitial pneumonia) ([place]) Interstitial pulmonary disease ([place]) - Plan: AMB referral to Pulmonary Rehabilitation Maintenance Program ([facility] Only) Chronic respiratory failure with hypoxia (HCC)  - Progressive  - Thanks for starting the ILD PRO registry study - continue o2 - continue maintenance rehab  - Please talk to PCP System, Pcp Not In -  and ensure you get  shingrix (GSK) inactivated vaccine against shingles - need to start CYTOXAN asap - this week oral and as soon as IV is figured out start IV  - if you do not hear from me next few days on this please email me   Insomnia, unspecified type - refer one of our sleep docs; to be seen asap next few weeks  Obesity  - can refer to Dr Kaylyn Lim of [facility] to discuss weight loss surgery if you are interested - have messaged hospital dietician to see where best we can get you into a hight quality progra

## 2017-12-20 NOTE — Research (Signed)
Title: Chronic Fibrosing Interstitial Lung Disease with Progressive Phenotype Prospective Outcomes (ILD-PRO) Registry ( ClinicalTrials.gov identifier [id number])  Protocol for  12/20/2017  is Version [id number] Consent for  12/20/2017  is Version  [id number]  Synopsis: To collect data & biological samples to support future research in chronic fibrosing ILDs. Registry will describe current approaches to diagnosis & treatment of chronic fibrosing ILDs, analyze participant characteristics to describe the history of disease, assess QOL from self-administered questionnaires, describe participant interactions with health care systems, describe other ILD treatment practices across institutions, and utilize biological samples linked to well characterized other ILD participants to ID disease biomarkers.                                                                                     Key Inclusion Criteria:  -Age >/= 69 Years  -Diagnosis of non-IPF ILD of any duration   -Chronic fibrosing ILD defined by reticular abnormality with traction bronchiectasis with/out honeycombing confirmed by chest HRCT scan and/or LB.  -Progressive phenotype defined by fulfilling at least ONE  of criteria below within last 12 months  ?10%  decline in FVC % pred   ?5 - <10% decline in FVC % pred + i worsening of respiratory symptoms   ?5 - <10% decline in FVC % pred + increasing extent of fibrotic changes on HRCT   worsening of respiratory symptoms +  increase of fibrotic changes on HRCT independent of FVC change.   Key Exclusion Criteria:  -Malignancy, other than skin or early stage prostate cancer, </= 5 years  -listed for lung transplantation at the time of enrollment  - enrolled in other  clinical trial  Clinical Research officer, political party / Research RN note :  This visit for Subject Tasha Ballard with DOB: 1970-07-26 on 12/20/2017 for the above protocol is Visit/Encounter consent  and is for purpose of research . Subject expressed continued interest and consent in continuing as a study subject. Subject confirmed that there was  no change in contact information (e.g. address, telephone, email). Subject thanked for participation in research and contribution to science.   In this visit 12/20/2017 the subject will be evaluated by investigator named Ramaswamy  . This research coordinator has verified that the investigator is up to date with his training logs  Additional details (CRC to choose 1 and delete the other) 1. Because this visit is a key visit of enrollment this visit is under direct supervision of the PI Dr. [name] . This PI is available for this visit  Signed by  T. Early Chars BS, [place], [place] 3:49 [place] 12/20/2017

## 2017-12-21 ENCOUNTER — Other Ambulatory Visit: Payer: Self-pay | Admitting: Internal Medicine

## 2017-12-21 ENCOUNTER — Encounter: Payer: Self-pay | Admitting: Internal Medicine

## 2017-12-21 DIAGNOSIS — J849 Interstitial pulmonary disease, unspecified: Secondary | ICD-10-CM

## 2017-12-21 NOTE — Telephone Encounter (Signed)
Called and spoke to patient, made aware of referrals, lab work, and future IV cytoxan infusions. Confirmed pharmacy. Cytoxan sent to pharmacy. All referrals placed. Patient will be in tomorrow to do labs. Voiced understanding. Will call tomorrow to set up IV cxtoxan infusions due to time. 7 day f/u appt has been made.

## 2017-12-21 NOTE — Telephone Encounter (Addendum)
Sending to triage in absence of EMily  1.Patient mentioned right diarphagrm elevation on CT at [facility] - Sept 2019. They recommended sniff test. I think she told me they did not do it. If so, please ask her and arrange for one at [place]  2.  Please refer patient to [facility] ([facility]) [facility] [place]. THis is a [place] program. Specifically to [employer] - this is for weight loss  3. Please refer to Bariatric Surgeon - Dr Kaylyn Lim or Dr Lucia Gaskins of [facility]  4 Let patient know of #-1 , #2, and # above -   5. We will  start oral cytoxan -  - needs baseline cbcd, bmet, lft and  Urine analysis - can come and do it at our lab - start cytoxan 26m per day - [facility]" .  - drink lot of fluids to avoid cystitis  - avoid intercourse without contraception - return in 7-10 days to see me in ILD clinic or APP (Aaron Edelmanor [name] or SJudson Roch   - recheck cbc, bmet, lft, ua on this day   6., Meanwhile ,eanwhile will try to get IV cytoxan set up. And regarding IV cytoxan - please call [facility] and see wwhat the process is  Thanks    SIGNATURE    Dr. MBrand Males M.D., F.C.C.P,  Pulmonary and Critical Care Medicine Staff Physician, CLumpkinDirector - Interstitial Lung Disease  Program  [place] [address] Pager: [phone] If no answer or between  15:00h - 7:00h: call [phone] Telephone: [phone]  5:01 PM 12/21/2017

## 2017-12-22 ENCOUNTER — Other Ambulatory Visit (INDEPENDENT_AMBULATORY_CARE_PROVIDER_SITE_OTHER): Payer: BLUE CROSS/BLUE SHIELD

## 2017-12-22 ENCOUNTER — Ambulatory Visit (INDEPENDENT_AMBULATORY_CARE_PROVIDER_SITE_OTHER): Payer: BLUE CROSS/BLUE SHIELD | Admitting: Pulmonary Disease

## 2017-12-22 ENCOUNTER — Encounter (HOSPITAL_COMMUNITY)
Admission: RE | Admit: 2017-12-22 | Discharge: 2017-12-22 | Disposition: A | Discharge status: Home / Self Care | Payer: BLUE CROSS/BLUE SHIELD | Source: Ambulatory Visit | Attending: Pulmonary Disease | Admitting: Pulmonary Disease

## 2017-12-22 ENCOUNTER — Telehealth: Payer: Self-pay | Admitting: Internal Medicine

## 2017-12-22 ENCOUNTER — Encounter: Payer: Self-pay | Admitting: Pulmonary Disease

## 2017-12-22 VITALS — BP 128/76 | HR 94 | Ht 62.0 in | Wt 229.0 lb

## 2017-12-22 DIAGNOSIS — J849 Interstitial pulmonary disease, unspecified: Secondary | ICD-10-CM | POA: Diagnosis not present

## 2017-12-22 DIAGNOSIS — G4733 Obstructive sleep apnea (adult) (pediatric): Secondary | ICD-10-CM

## 2017-12-22 LAB — HEPATIC FUNCTION PANEL
ALT: 21 U/L (ref 0–35)
AST: 14 U/L (ref 0–37)
Albumin: 4.2 g/dL (ref 3.5–5.2)
Alkaline Phosphatase: 54 U/L (ref 39–117)
BILIRUBIN DIRECT: 0 mg/dL (ref 0.0–0.3)
TOTAL PROTEIN: 6.7 g/dL (ref 6.0–8.3)
Total Bilirubin: 0.3 mg/dL (ref 0.2–1.2)

## 2017-12-22 LAB — CBC WITH DIFFERENTIAL/PLATELET
BASOS ABS: 0.1 10*3/uL (ref 0.0–0.1)
Basophils Relative: 0.8 % (ref 0.0–3.0)
EOS ABS: 0.3 10*3/uL (ref 0.0–0.7)
EOS PCT: 2.2 % (ref 0.0–5.0)
HCT: 32.8 % — ABNORMAL LOW (ref 36.0–46.0)
HEMOGLOBIN: 10.3 g/dL — AB (ref 12.0–15.0)
Lymphocytes Relative: 15.2 % (ref 12.0–46.0)
Lymphs Abs: 1.9 10*3/uL (ref 0.7–4.0)
MCHC: 31.5 g/dL (ref 30.0–36.0)
MCV: 80.6 fl (ref 78.0–100.0)
MONO ABS: 0.8 10*3/uL (ref 0.1–1.0)
Monocytes Relative: 6.9 % (ref 3.0–12.0)
Neutro Abs: 9.2 10*3/uL — ABNORMAL HIGH (ref 1.4–7.7)
Neutrophils Relative %: 74.9 % (ref 43.0–77.0)
Platelets: 349 10*3/uL (ref 150.0–400.0)
RBC: 4.07 Mil/uL (ref 3.87–5.11)
RDW: 15.1 % (ref 11.5–15.5)
WBC: 12.3 10*3/uL — AB (ref 4.0–10.5)

## 2017-12-22 LAB — BASIC METABOLIC PANEL
BUN: 12 mg/dL (ref 6–23)
CALCIUM: 10.9 mg/dL — AB (ref 8.4–10.5)
CO2: 40 mEq/L — ABNORMAL HIGH (ref 19–32)
Chloride: 90 mEq/L — ABNORMAL LOW (ref 96–112)
Creatinine, Ser: 0.76 mg/dL (ref 0.40–1.20)
GFR: 85.85 mL/min (ref >60.00)
Glucose, Bld: 165 mg/dL — ABNORMAL HIGH (ref 70–99)
Potassium: 3.3 mEq/L — ABNORMAL LOW (ref 3.5–5.1)
Sodium: 137 mEq/L (ref 135–145)

## 2017-12-22 LAB — URINALYSIS
Bilirubin Urine: NEGATIVE
Hgb urine dipstick: NEGATIVE
Ketones, ur: NEGATIVE
LEUKOCYTES UA: NEGATIVE
Nitrite: NEGATIVE
PH: 6.5 (ref 5.0–8.0)
SPECIFIC GRAVITY, URINE: 1.01 (ref 1.000–1.030)
Total Protein, Urine: NEGATIVE
URINE GLUCOSE: NEGATIVE
Urobilinogen, UA: 0.2 (ref 0.0–1.0)

## 2017-12-22 NOTE — Progress Notes (Signed)
Tasha Ballard    [id number]    06-13-1970  Primary Care Physician:Escajeda, Delfino Lovett, MD  Referring Physician: No referring provider defined for this encounter.  Chief complaint:   Patient with interstitial lung disease History of snoring, history of apneas Nonrestorative sleep, insomnia  HPI:  Currently follows with Dr. [name] for interstitial lung disease   She has had a sleep study in the past that was positive Repeat recently was negative She has significant symptoms suggesting the presence of sleep disordered breathing was the reason for referral  Admits to snoring, admits to witnessed apneas Usually goes to bed between 9:30 AM 10, takes her up to an hour sometimes to fall asleep to TV watching TV May wake up between 5 and 6 times during the night Wakes up between 6 and 8 AM Denies sleepiness during the day Sleep is restless  Occupation: Owns her own business, catering Never smoker  Outpatient Encounter Medications as of 12/22/2017  Medication Sig  . acyclovir (ZOVIRAX) 400 MG tablet Take 400 mg by mouth 3 (three) times daily.  Marland Kitchen apixaban (ELIQUIS) 5 MG TABS tablet Take 5 mg by mouth 2 (two) times daily.  . benzonatate (TESSALON) 100 MG capsule Take by mouth 3 (three) times daily as needed for cough.  . carvedilol (COREG) 6.25 MG tablet Take 6.25 mg by mouth 2 (two) times daily with a meal.  . cholecalciferol (VITAMIN D) 400 units TABS tablet Take 800 Units by mouth. Pt takes 1,000 units daily  . furosemide (LASIX) 40 MG tablet Take 40 mg by mouth daily. 40 mg daily but on days of swelling twice a day  . metFORMIN (GLUCOPHAGE) 500 MG tablet Take by mouth 2 (two) times daily with a meal.  . montelukast (SINGULAIR) 10 MG tablet Take 10 mg by mouth daily after supper.  . mycophenolate (CELLCEPT) 500 MG tablet Take 1,500 mg by mouth 2 (two) times daily. Takes 1 500 mg twice a day  . predniSONE (DELTASONE) 10 MG tablet Take 1 tablet (10 mg total) by mouth  daily with breakfast.  . predniSONE (DELTASONE) 10 MG tablet Take 10-59m as needed for SOB  . spironolactone (ALDACTONE) 100 MG tablet Take 100 mg by mouth daily.  .Marland Kitchensulfamethoxazole-trimethoprim (BACTRIM) 400-80 MG tablet Once a day while on prednisone  . Sulfamethoxazole-Trimethoprim (SEPTRA DS PO) Take by mouth daily. 400-80  Once a day while on prednisone  . thyroid (ARMOUR) 30 MG tablet Take 60 mg by mouth daily before breakfast. Takes 60 mg tablet   No facility-administered encounter medications on file as of 12/22/2017.     Allergies as of 12/22/2017 - Review Complete 12/22/2017  Allergen Reaction Noted  . Macrobid [nitrofurantoin macrocrystal] Nausea And Vomiting 08/10/2017    Past Medical History:  Diagnosis Date  . Aneurysm ([place]   . Diabetes mellitus without complication ([place]   . Thyroid disease     No past surgical history on file.  No family history on file.  Social History   Socioeconomic History  . Marital status: Married    Spouse name: Not on file  . Number of children: Not on file  . Years of education: Not on file  . Highest education level: Not on file  Occupational History  . Not on file  Social Needs  . Financial resource strain: Not on file  . Food insecurity:    Worry: Not on file    Inability: Not on file  . Transportation needs:  Medical: Not on file    Non-medical: Not on file  Tobacco Use  . Smoking status: Never Smoker  . Smokeless tobacco: Never Used  Substance and Sexual Activity  . Alcohol use: Not on file  . Drug use: Not on file  . Sexual activity: Not on file  Lifestyle  . Physical activity:    Days per week: Not on file    Minutes per session: Not on file  . Stress: Not on file  Relationships  . Social connections:    Talks on phone: Not on file    Gets together: Not on file    Attends religious service: Not on file    Active member of club or organization: Not on file    Attends meetings of clubs or organizations:  Not on file    Relationship status: Not on file  . Intimate partner violence:    Fear of current or ex partner: Not on file    Emotionally abused: Not on file    Physically abused: Not on file    Forced sexual activity: Not on file  Other Topics Concern  . Not on file  Social History Narrative  . Not on file    Review of Systems  Eyes: Negative.   Respiratory: Positive for apnea and shortness of breath.   Cardiovascular: Negative.   Musculoskeletal: Negative.   Psychiatric/Behavioral: Positive for sleep disturbance.  All other systems reviewed and are negative.   Vitals:   12/22/17 1115  BP: 128/76  Pulse: 94  SpO2: 97%     Physical Exam  Constitutional: She is oriented to person, place, and time. She appears well-developed and well-nourished. No distress.  HENT:  Head: Normocephalic and atraumatic.  Mallampati 4  Eyes: Pupils are equal, round, and reactive to light. Conjunctivae and EOM are normal. Right eye exhibits no discharge. Left eye exhibits no discharge.  Neck: Normal range of motion. Neck supple. No tracheal deviation present. No thyromegaly present.  Cardiovascular: Normal rate and regular rhythm. Exam reveals no friction rub.  No murmur heard. Pulmonary/Chest: Effort normal and breath sounds normal. No respiratory distress. She has no wheezes.  Abdominal: Soft. Bowel sounds are normal. She exhibits no distension. There is no tenderness.  Musculoskeletal: She exhibits no edema or deformity.  Neurological: She is alert and oriented to person, place, and time. She has normal reflexes. No cranial nerve deficit.  Skin: Skin is warm and dry. She is not diaphoretic. No erythema.  Psychiatric: She has a normal mood and affect.   Data Reviewed:  Records reviewed Dr. Golden Pop notes reviewed Notes from [facility] reviewed  Assessment:  High probability significant sleep disordered breathing -She had a study that was positive in the past Most recent study was  negative, however, does have significant symptoms suggesting presence of significant sleep disordered breathing  Nonrestorative sleep -Multifactorial reasons  Interstitial lung disease -Continuing regular follow-up -Starting Cytoxan -Being evaluated for transplant  Chronic respiratory failure -Secondary to interstitial lung disease -Paralyzed hemidiaphragm  Hypercapnic respiratory failure  Plan/Recommendations:  We will order an in lab polysomnogram  Because of significant comorbidity will not order a split-night Will monitor transcutaneous CO2  Pathophysiology of sleep disordered breathing discussed  Treatment options discussed  Patient will likely need a BiPAP with oxygen piped into the system for treatment of significant sleep disordered breathing  Encouraged to call with any significant concerns/questions  Sherrilyn Rist MD [facility] 12/22/2017, 11:45 AM  CC: No ref. provider found

## 2017-12-22 NOTE — Telephone Encounter (Signed)
  TRiage  LEt Neomia Dear Mcauley know  A. K is low - start 16mq kcl x 7 days B. Baseline anemia 10gm% C. UA I snormal D. Ok to start oral cytoxan E. Can you please reply to message sent yesterday to triage please so I know all action items resolved  Thanks    SIGNATURE    Dr. MBrand Males M.D., F.C.C.P,  Pulmonary and Critical Care Medicine Staff Physician, CDresserDirector - Interstitial Lung Disease  Program  [place] [address] Pager: [phone] If no answer or between  15:00h - 7:00h: call [phone] Telephone: [phone]  3:13 PM 12/22/2017      LABS    PULMONARY No results for input(s): PHART, PCO2ART, PO2ART, HCO3, TCO2, O2SAT in the last 168 hours.  Invalid input(s): PCO2, PO2  CBC Recent Labs  Lab 12/22/17 1047  HGB 10.3*  HCT 32.8*  WBC 12.3*  PLT 349.0    COAGULATION No results for input(s): INR in the last 168 hours.  CARDIAC  No results for input(s): TROPONINI in the last 168 hours. No results for input(s): PROBNP in the last 168 hours.   CHEMISTRY Recent Labs  Lab 12/22/17 1047  NA 137  K 3.3*  CL 90*  CO2 40*  GLUCOSE 165*  BUN 12  CREATININE 0.76  CALCIUM 10.9*   Estimated Creatinine Clearance: 96.2 mL/min (by C-G formula based on SCr of 0.76 mg/dL).   LIVER Recent Labs  Lab 12/22/17 1047  AST 14  ALT 21  ALKPHOS 54  BILITOT 0.3  PROT 6.7  ALBUMIN 4.2     INFECTIOUS No results for input(s): LATICACIDVEN, PROCALCITON in the last 168 hours.   ENDOCRINE CBG (last 3)  No results for input(s): GLUCAP in the last 72 hours.       IMAGING x48h  - image(s) personally visualized  -   highlighted in bold No results found.

## 2017-12-22 NOTE — Telephone Encounter (Signed)
Please refer to my phone note from 1yesterday 12/21/17 - 14:11. YOu might have to do uncheck all. Not sure if it went to triage or not. I am also not sure why triage has not replied to that message. There are  Lot of action items on that

## 2017-12-22 NOTE — Telephone Encounter (Signed)
Pt is requesting to speak to nurse about previous msg. Cb is [phone]

## 2017-12-22 NOTE — Telephone Encounter (Signed)
Spoke with patient. She is aware of results. She wants to know the dosage and frequency of the cytoxan since it was not discussed during her OV. She wants to use [facility] for her pharmacy. She is aware of K+ results, she already has K+ tablets at home.   MR, please advise on the cytoxan. Thanks.

## 2017-12-22 NOTE — Patient Instructions (Signed)
History suggestive of significant sleep disordered breathing Multiple awakenings, snoring, witnessed apneas Chronic respiratory failure  We will schedule you for an in lab diagnostic study this will be followed by a titration study to BiPAP Transcutaneous CO2 monitoring should be requested during the study  We will see you back in the office in about 3 months  We will call you back with results as the become available

## 2017-12-23 MED ORDER — CYCLOPHOSPHAMIDE 50 MG PO CAPS: 50.0000 mg | ORAL_CAPSULE | Freq: Every day | ORAL | 2 refills | Status: DC

## 2017-12-23 MED FILL — CYCLOPHOSPHAMIDE 50 MG CAPS: 50 | 30 days supply | Qty: 30 | Fill #0

## 2017-12-23 NOTE — Telephone Encounter (Signed)
There is no 12/21/17 message in this pt's chart  I have sent rx to [facility] (called number pt provided and was told that they pull from [facility] out pt pharm location) [facility] for the pt to give appt with MR or app for 7-10 days  Need to inform her to drink plenty of fluids and avoid intercourse without contraception

## 2017-12-23 NOTE — Telephone Encounter (Signed)
Guys  I worry aobut lot of people handling this triage message. I dw [place] and the dosing regimen for oral cytoxan on 10/3/0/19 message and I discussed face to face with [place] again 12/23/2017 morning. In any event  start cytoxan 60m per day - call into pharmacy             - drink lot of fluids to avoid cystitis             - avoid intercourse without contraception - return in 7-10 days to see me in ILD clinic or APP (Aaron Edelmanor [name] or SJudson Roch              - recheck cbc, bmet, lft, ua on this day

## 2017-12-23 NOTE — Telephone Encounter (Signed)
Still waiting on dose and directions before we call in  MR- please advise

## 2017-12-23 NOTE — Telephone Encounter (Signed)
Patient calling back with pharmacy to send [facility] ph [phone], fax [phone].  Patient asks for CB to let her know when it has been sent over and she can go get this, CB is [phone] or [phone].

## 2017-12-23 NOTE — Telephone Encounter (Signed)
Spoke with the pt notified of MR's recs and that rx was sent  She verbalized understanding  Appt with Aaron Edelman already set up

## 2017-12-23 NOTE — Telephone Encounter (Signed)
Patient states she received call and her phone was not working, CB is [phone].

## 2017-12-23 NOTE — Telephone Encounter (Signed)
CPAP use report from [facility] 8/26 to 11/11/17  - % of days < 4h -65%,  % of days > 4h - 35% - % days used - 9 day - avg use all days 3h and 61m - avg use on days used 4h 45 min - days not used 9 and days yused 17   Plan  0-await ABG 12/28/17 - please tell Tasha Ballard to try to use it on more number of days and each time atleast 4-5h

## 2017-12-23 NOTE — Telephone Encounter (Signed)
LMTCB

## 2017-12-23 NOTE — Telephone Encounter (Signed)
MR- pt is aware of the results below  TRiage  LEt [name] know  A. K is low - start 34mq kcl x 7 days B. Baseline anemia 10gm% C. UA I snormal D. Ok to start oral cytoxan E. Can you please reply to message sent yesterday to triage please so I know all action items resolved  Thanks  The question now is the dose and frequency of the Cytoxan  Please advise thanks

## 2017-12-29 ENCOUNTER — Other Ambulatory Visit: Payer: Self-pay | Admitting: Internal Medicine

## 2017-12-29 DIAGNOSIS — Z5181 Encounter for therapeutic drug level monitoring: Secondary | ICD-10-CM

## 2017-12-30 ENCOUNTER — Telehealth: Payer: Self-pay

## 2017-12-30 ENCOUNTER — Encounter: Payer: Self-pay | Admitting: Pulmonary Disease

## 2017-12-30 ENCOUNTER — Ambulatory Visit (INDEPENDENT_AMBULATORY_CARE_PROVIDER_SITE_OTHER): Payer: BLUE CROSS/BLUE SHIELD | Admitting: Pulmonary Disease

## 2017-12-30 ENCOUNTER — Other Ambulatory Visit (INDEPENDENT_AMBULATORY_CARE_PROVIDER_SITE_OTHER): Payer: BLUE CROSS/BLUE SHIELD

## 2017-12-30 VITALS — BP 136/78 | HR 93 | Ht 62.0 in | Wt 227.2 lb

## 2017-12-30 DIAGNOSIS — Z5181 Encounter for therapeutic drug level monitoring: Secondary | ICD-10-CM

## 2017-12-30 DIAGNOSIS — Z9981 Dependence on supplemental oxygen: Secondary | ICD-10-CM | POA: Diagnosis not present

## 2017-12-30 DIAGNOSIS — J8489 Other specified interstitial pulmonary diseases: Secondary | ICD-10-CM | POA: Insufficient documentation

## 2017-12-30 DIAGNOSIS — Z6841 Body Mass Index (BMI) 40.0 and over, adult: Secondary | ICD-10-CM

## 2017-12-30 LAB — HEPATIC FUNCTION PANEL
ALBUMIN: 4.4 g/dL (ref 3.5–5.2)
ALT: 21 U/L (ref 0–35)
AST: 14 U/L (ref 0–37)
Alkaline Phosphatase: 59 U/L (ref 39–117)
Bilirubin, Direct: 0 mg/dL (ref 0.0–0.3)
TOTAL PROTEIN: 7.5 g/dL (ref 6.0–8.3)
Total Bilirubin: 0.4 mg/dL (ref 0.2–1.2)

## 2017-12-30 LAB — CBC WITH DIFFERENTIAL/PLATELET
BASOS ABS: 0.2 10*3/uL — AB (ref 0.0–0.1)
Basophils Relative: 1.4 % (ref 0.0–3.0)
EOS ABS: 0.1 10*3/uL (ref 0.0–0.7)
Eosinophils Relative: 0.7 % (ref 0.0–5.0)
HCT: 34.8 % — ABNORMAL LOW (ref 36.0–46.0)
Hemoglobin: 10.9 g/dL — ABNORMAL LOW (ref 12.0–15.0)
Lymphocytes Relative: 5.3 % — ABNORMAL LOW (ref 12.0–46.0)
Lymphs Abs: 0.8 10*3/uL (ref 0.7–4.0)
MCHC: 31.3 g/dL (ref 30.0–36.0)
MCV: 80.1 fl (ref 78.0–100.0)
MONO ABS: 0.9 10*3/uL (ref 0.1–1.0)
Monocytes Relative: 5.4 % (ref 3.0–12.0)
NEUTROS PCT: 87.2 % — AB (ref 43.0–77.0)
Neutro Abs: 13.8 10*3/uL — ABNORMAL HIGH (ref 1.4–7.7)
Platelets: 374 10*3/uL (ref 150.0–400.0)
RBC: 4.35 Mil/uL (ref 3.87–5.11)
RDW: 14.8 % (ref 11.5–15.5)
WBC: 15.8 10*3/uL — AB (ref 4.0–10.5)

## 2017-12-30 LAB — COMPREHENSIVE METABOLIC PANEL
ALBUMIN: 4.4 g/dL (ref 3.5–5.2)
ALK PHOS: 59 U/L (ref 39–117)
ALT: 21 U/L (ref 0–35)
AST: 14 U/L (ref 0–37)
BUN: 13 mg/dL (ref 6–23)
CO2: 36 mEq/L — ABNORMAL HIGH (ref 19–32)
CREATININE: 0.76 mg/dL (ref 0.40–1.20)
Calcium: 10.1 mg/dL (ref 8.4–10.5)
Chloride: 92 mEq/L — ABNORMAL LOW (ref 96–112)
GFR: 85.85 mL/min (ref >60.00)
Glucose, Bld: 145 mg/dL — ABNORMAL HIGH (ref 70–99)
Potassium: 3.9 mEq/L (ref 3.5–5.1)
Sodium: 137 mEq/L (ref 135–145)
TOTAL PROTEIN: 7.5 g/dL (ref 6.0–8.3)
Total Bilirubin: 0.4 mg/dL (ref 0.2–1.2)

## 2017-12-30 LAB — URINALYSIS
Bilirubin Urine: NEGATIVE
HGB URINE DIPSTICK: NEGATIVE
Ketones, ur: NEGATIVE
Leukocytes, UA: NEGATIVE
Nitrite: NEGATIVE
PH: 7 (ref 5.0–8.0)
Specific Gravity, Urine: 1.01 (ref 1.000–1.030)
TOTAL PROTEIN, URINE-UPE24: NEGATIVE
URINE GLUCOSE: NEGATIVE
Urobilinogen, UA: 0.2 (ref 0.0–1.0)

## 2017-12-30 NOTE — Assessment & Plan Note (Signed)
Continue oxygen therapy as prescribed

## 2017-12-30 NOTE — Assessment & Plan Note (Signed)
Continue pulmonary rehab

## 2017-12-30 NOTE — Telephone Encounter (Signed)
Received call from MR. Per MR we are not going to start the patient on IV cytoxan at this time. Patient has started the cytoxan oral at this time and if anything changes MR will let us know. Will route to MR as FYI.

## 2017-12-30 NOTE — Telephone Encounter (Signed)
Called and spoke to Rep with [employer] per Rep we do need a PA for cytoxan Infusion however they need the exact orders meaning dosage, frequency, etc. Will message MR regarding.   MR please advise of IV cytoxan order. Your notes just say IV cytoxan. Please specify dosage, frequency, etc.

## 2017-12-30 NOTE — Assessment & Plan Note (Signed)
Continue cytoxan 46m per day              - drink lot of fluids to avoid cystitis             - avoid intercourse without contraception             - recheck cbc, bmet, lft, ua  We will follow up with you about IV cytoxan   01/02/18 >>> increase Cytoxan to 1089mdaily  Repeat lab work next week  Follow-up with our office in 2 weeks with Dr. RaChase Caller

## 2017-12-30 NOTE — Progress Notes (Signed)
_0  ID: Tasha Ballard, female    DOB: 02/29/1970, 47 y.o.   MRN: [id number]  Chief Complaint  Patient presents with  . Follow-up    1 week follow up, needs labs     Referring provider: No ref. provider found  HPI:  47 year old female patient followed in our office for interstitial lung disease.  Patient followed by Dr. Ander Slade for obstructive sleep apnea.  Managed on CPAP.  PMH: drug induced DM  Smoker/ Smoking History: Never smoker Maintenance:   Cytoxan, CellCept Pt of: Dr. [name], Dr. Ander Slade for Sleep   Recent [facility] Encounters:   12/20/17 - IOV - Dr. [name] Interstitial lung disease clinic integrated interstitial lung disease questionnaire: Symptoms: Shortness of breath for years.  Gradually worse.  Currently level 5 shortness of breath walking up stairs or walking up a hill with oxygen.  Level for shortness of breath for sweeping or vacuuming and level 3 shortness of breath for shopping or picking up things and doing laundry.  Level 3 shortness of breath or standing up from a chair or taking a shower making the bed and level 1 dyspnea for brushing teeth.  She also has a cough for 6 years which is getting worse.  On and off that is phlegm.  Occasional wheezing present there is associated fatigue.  She tells me that she had undiagnosed and unrecognized ILD for years.  This was then picked up at [facility] and then she got referred to [facility].  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  At this time she was placed on CellCept.  Then in 2016 she got somewhat worse and was placed on 2 L nasal cannula continuously.  In 2017 she tells me that she had a aortic aneurysm and this was repaired at [facility].  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  And then for the last year and a half or so she has been on significantly worsening  hypoxemia.  In the spring 2019 she is requiring 8 L at rest and 15 L at exertion.  Apparently Dr. Lauris Chroman then recommended Rituxan infusions which she got to 60 days apart with the last one being approximately in April of May 2019.  After that she is somewhat improved requiring only 9-10 L with exertion.  She has been attending pulmonary rehabilitation which has been helping.  She is obese and unable to lose weight although she is lost some.  She believes a lot of her oxygen needs might be related to deviated nasal septum/septal perforation.  She subsequently followed up with Dr. Ruthann Cancer at [facility] in the summer 2019.  I reviewed the notes.  Palliative care was recommended because of progressive respiratory failure.  She has never seen the transplant clinic at [facility] but she recommends that because of obesity and diaphragmatic dysfunction that she would not be a good candidate for transplant .  She was then frustrated with experience because she wants to fight the disease.  She then drove to [facility] and saw Dr. Elisabeth Cara at [facility] 1 week ago.  She spent substantial number of days there.  At this point in time she showed me an email from him that suggest he might have myositis-ILD but they would like to review her CT scans and pathology from [facility].  Apparently she had significant amount of blood work and other  tests at [facility].  I do not have access to these records at this point.  She believes that she wants to undergo transplant.  She thinks that she would be able to have transplant in [place] despite a high BMI. Plan: start cytoxan, sleep eval   12/22/2017-telephone encounter - Dr. [name] >>>starting Cytoxan 50 mg/day >>>Avoid lots of fluids to avoid cystitis, avoid intercourse without contraception, return in 7 to 10 days to recheck CBC, Bmet, LFTs, UA  12/22/17-office visit-[place] >>>Plan in lab sleep study be ordered, patient will likely need BiPAP  with oxygen  12/30/2017  - Visit   47 year old female patient presenting today for one-week follow-up visit after starting Cytoxan.  Patient also get lab work completed today.  Unfortunately patient did not get this completed prior to her office visit she will get afterwards.  Patient reports baseline symptoms of nonproductive cough.  Requiring 4 to 6 L of oxygen at rest as well as 10 to 15 L of oxygen with exertion.  Patient does have her baseline shortness of breath with exertion.  Patient reporting no worsening symptoms since starting Cytoxan.   Patient reports she typically is on 9 to 10 L of oxygen at home.   FENO:  No results found for: NITRICOXIDE  PFT: No flowsheet data found.  Imaging: No results found.  Chart Review:    Specialty Problems      Pulmonary Problems   Interstitial pulmonary disease ([place])    10/26/2011 right VATS lung biopsy  Diagnosis SPT A. "RIGHT UPPER LOBE" (WEDGE BIOPSY OF LUNG):  PULMONARY TISSUE WITH DIFFUSE INTERSTITIAL FIBROSIS AND ASSOCIATED EXTENSIVE DESQUAMATIVE INTERSTITIAL PNEUMONIA (DIP) PATTERN OF ALVEOLAR MACROPHAGE ACCUMULATIONS. SEE COMMENT.  B. "RIGHT MIDDLE LOBE" (WEDGE BIOPSY OF LUNG):  PULMONARY TISSUE WITH DIFFUSE INTERSTITIAL FIBROSIS AND ASSOCIATED EXTENSIVE DESQUAMATIVE INTERSTITIAL PNEUMONIA (DIP) PATTERN OF ALVEOLAR MACROPHAGE ACCUMULATION. SEE COMMENT.  COMMENT: Sections of the biopsy specimens demonstrate similar findings. There is a diffuse and uniform pattern of interstitial fibrosis without temporal variegation. A focus of honeycombing is identified, however this is not consistent throughout the biopsy. This pattern is most consistent with the fibrosing variant of Non-Specific Interstitial Pneumonia (NSIP). The interstitial fibrotic changes are more diffuse and severe than the "DIP-like" alveolar macrophage accumulations; a number of interstitial pneumonitides may feature such. Overview:  10/26/2011 right VATS lung  biopsy  Diagnosis SPT A. "RIGHT UPPER LOBE" (WEDGE BIOPSY OF LUNG):  PULMONARY TISSUE WITH DIFFUSE INTERSTITIAL FIBROSIS AND ASSOCIATED EXTENSIVE DESQUAMATIVE INTERSTITIAL PNEUMONIA (DIP) PATTERN OF ALVEOLAR MACROPHAGE ACCUMULATIONS. SEE COMMENT.  B. "RIGHT MIDDLE LOBE" (WEDGE BIOPSY OF LUNG):  PULMONARY TISSUE WITH DIFFUSE INTERSTITIAL FIBROSIS AND ASSOCIATED EXTENSIVE DESQUAMATIVE INTERSTITIAL PNEUMONIA (DIP) PATTERN OF ALVEOLAR MACROPHAGE ACCUMULATION. SEE COMMENT.  COMMENT: Sections of the biopsy specimens demonstrate similar findings. There is a diffuse and uniform pattern of interstitial fibrosis without temporal variegation. A focus of honeycombing is identified, however this is not consistent throughout the biopsy. This pattern is most consistent with the fibrosing variant of Non-Specific Interstitial Pneumonia (NSIP). The interstitial fibrotic changes are more diffuse and severe than the "DIP-like" alveolar macrophage accumulations; a number of interstitial pneumonitides may feature such. 10/26/2011 right VATS lung biopsy  Diagnosis SPT A. "RIGHT UPPER LOBE" (WEDGE BIOPSY OF LUNG):  PULMONARY TISSUE WITH DIFFUSE INTERSTITIAL FIBROSIS AND ASSOCIATED EXTENSIVE DESQUAMATIVE INTERSTITIAL PNEUMONIA (DIP) PATTERN OF ALVEOLAR MACROPHAGE ACCUMULATIONS. SEE COMMENT.  B. "RIGHT MIDDLE LOBE" (WEDGE BIOPSY OF LUNG):  PULMONARY TISSUE WITH DIFFUSE INTERSTITIAL FIBROSIS AND ASSOCIATED EXTENSIVE DESQUAMATIVE INTERSTITIAL PNEUMONIA (DIP) PATTERN OF  ALVEOLAR MACROPHAGE ACCUMULATION. SEE COMMENT.  COMMENT: Sections of the biopsy specimens demonstrate similar findings. There is a diffuse and uniform pattern of interstitial fibrosis without temporal variegation. A focus of honeycombing is identified, however this is not consistent throughout the biopsy. This pattern is most consistent with the fibrosing variant of Non-Specific Interstitial Pneumonia (NSIP). The interstitial fibrotic  changes are more diffuse and severe than the "DIP-like" alveolar macrophage accumulations; a number of interstitial pneumonitides may feature such. 10/26/2011 right VATS lung biopsy  Diagnosis SPT A. "RIGHT UPPER LOBE" (WEDGE BIOPSY OF LUNG):  PULMONARY TISSUE WITH DIFFUSE INTERSTITIAL FIBROSIS AND ASSOCIATED EXTENSIVE DESQUAMATIVE INTERSTITIAL PNEUMONIA (DIP) PATTERN OF ALVEOLAR MACROPHAGE ACCUMULATIONS. SEE COMMENT.  B. "RIGHT MIDDLE LOBE" (WEDGE BIOPSY OF LUNG):  PULMONARY TISSUE WITH DIFFUSE INTERSTITIAL FIBROSIS AND ASSOCIATED EXTENSIVE DESQUAMATIVE INTERSTITIAL PNEUMONIA (DIP) PATTERN OF ALVEOLAR MACROPHAGE ACCUMULATION. SEE COMMENT.  COMMENT: Sections of the biopsy specimens demonstrate similar findings. There is a diffuse and uniform pattern of interstitial fibrosis without temporal variegation. A focus of honeycombing is identified, however this is not consistent throughout the biopsy. This pattern is most consistent with the fibrosing variant of Non-Specific Interstitial Pneumonia (NSIP). The interstitial fibrotic changes are more diffuse and severe than the "DIP-like" alveolar macrophage accumulations; a number of interstitial pneumonitides may feature such.      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 Macrobid [Nitrofurantoin Macrocrystal] Nausea And Vomiting    Immunization History  Administered Date(s) Administered  . Influenza,inj,Quad PF,6+ Mos 12/24/2014, 02/05/2016, 01/07/2017, 11/22/2017  . Pneumococcal Conjugate-13 07/20/2017  . Pneumococcal Polysaccharide-23 06/09/2012    Past Medical History:  Diagnosis Date  . Aneurysm ([place])   . Diabetes mellitus without complication ([place])   . Thyroid disease     Tobacco History: Social  History   Tobacco Use  Smoking Status Never Smoker  Smokeless Tobacco Never Used   Counseling given: Not Answered  Continue to not smoke  Outpatient Encounter Medications as of 12/30/2017  Medication Sig  . apixaban (ELIQUIS) 5 MG TABS tablet Take 5 mg by mouth 2 (two) times daily.  . benzonatate (TESSALON) 100 MG capsule Take by mouth 3 (three) times daily as needed for cough.  . carvedilol (COREG) 6.25 MG tablet Take 6.25 mg by mouth 2 (two) times daily with a meal.  . cholecalciferol (VITAMIN D) 400 units TABS tablet Take 800 Units by mouth. Pt takes 1,000 units daily  . cyclophosphamide (CYTOXAN) 50 MG capsule Take 1 capsule (50 mg total) by mouth daily. Give on an empty stomach 1 hour before or 2 hours after meals.  . furosemide (LASIX) 40 MG tablet Take 40 mg by mouth daily. 40 mg daily but on days of swelling twice a day  . metFORMIN (GLUCOPHAGE) 500 MG tablet Take by mouth 2 (two) times daily with a meal.  . montelukast (SINGULAIR) 10 MG tablet Take 10 mg by mouth daily after supper.  . predniSONE (DELTASONE) 10 MG tablet Take 10-46m as needed for SOB  . spironolactone (ALDACTONE) 100 MG tablet Take 100 mg by mouth daily.  .Marland Kitchensulfamethoxazole-trimethoprim (BACTRIM) 400-80 MG tablet Once a day while on prednisone  . Sulfamethoxazole-Trimethoprim (SEPTRA DS PO) Take by mouth daily. 400-80  Once a day while on prednisone  . thyroid (ARMOUR) 30 MG tablet  Take 60 mg by mouth daily before breakfast. Takes 60 mg tablet  . [DISCONTINUED] mycophenolate (CELLCEPT) 500 MG tablet Take 1,500 mg by mouth 2 (two) times daily. Takes 1 500 mg twice a day  . [DISCONTINUED] predniSONE (DELTASONE) 10 MG tablet Take 1 tablet (10 mg total) by mouth daily with breakfast.  . acyclovir (ZOVIRAX) 400 MG tablet Take 400 mg by mouth 3 (three) times daily.   No facility-administered encounter medications on file as of 12/30/2017.      Review of Systems  Review of Systems  Constitutional: Negative for  chills, fatigue, fever and unexpected weight change.  HENT: Negative for congestion, ear pain, postnasal drip, sinus pressure and sinus pain.   Respiratory: Positive for cough (dry ) and shortness of breath. Negative for chest tightness and wheezing.   Cardiovascular: Negative for chest pain and palpitations.  Gastrointestinal: Negative for blood in stool, diarrhea, nausea and vomiting.  Genitourinary: Negative for dysuria, frequency and urgency.  Musculoskeletal: Negative for arthralgias.  Skin: Negative for color change.  Allergic/Immunologic: Negative for environmental allergies and food allergies.  Neurological: Negative for dizziness, light-headedness and headaches.  Psychiatric/Behavioral: Negative for dysphoric mood. The patient is not nervous/anxious.   All other systems reviewed and are negative.    Physical Exam  BP 136/78 (BP Location: Left Arm, Cuff Size: Normal)   Pulse 93   Ht _0  (1.575 m)   Wt 227 lb 3.2 oz (103.1 kg)   SpO2 98%   BMI 41.56 kg/m   Wt Readings from Last 5 Encounters:  12/30/17 227 lb 3.2 oz (103.1 kg)  12/22/17 229 lb (103.9 kg)  12/20/17 227 lb 1.2 oz (103 kg)  12/01/17 229 lb 4.5 oz (104 kg)  11/24/17 221 lb (100.2 kg)   15L via [place]   Physical Exam  Constitutional: She is oriented to person, place, and time and well-developed, well-nourished, and in no distress. No distress.  HENT:  Head: Normocephalic and atraumatic.  Right Ear: Hearing, tympanic membrane, external ear and ear canal normal.  Left Ear: Hearing, tympanic membrane, external ear and ear canal normal.  Nose: Nose normal. Right sinus exhibits no maxillary sinus tenderness and no frontal sinus tenderness. Left sinus exhibits no maxillary sinus tenderness and no frontal sinus tenderness.  Mouth/Throat: Uvula is midline and oropharynx is clear and moist. No oropharyngeal exudate.  Eyes: Pupils are equal, round, and reactive to light.  Neck: Normal range of motion. Neck supple.    Cardiovascular: Normal rate, regular rhythm and normal heart sounds.  Pulmonary/Chest: Effort normal. No accessory muscle usage. No respiratory distress. She has no decreased breath sounds. She has no wheezes. She has no rhonchi. She has rales (BB crackles ).  Lymphadenopathy:    She has no cervical adenopathy.  Neurological: She is alert and oriented to person, place, and time. Gait normal.  Skin: She is not diaphoretic.  Psychiatric: Mood, memory, affect and judgment normal.  Nursing note and vitals reviewed.     Lab Results:  CBC    Component Value Date/Time   WBC 12.3 (H) 12/22/2017 1047   RBC 4.07 12/22/2017 1047   HGB 10.3 (L) 12/22/2017 1047   HCT 32.8 (L) 12/22/2017 1047   PLT 349.0 12/22/2017 1047   MCV 80.6 12/22/2017 1047   MCHC 31.5 12/22/2017 1047   RDW 15.1 12/22/2017 1047   LYMPHSABS 1.9 12/22/2017 1047   MONOABS 0.8 12/22/2017 1047   EOSABS 0.3 12/22/2017 1047   BASOSABS 0.1 12/22/2017 1047  BMET    Component Value Date/Time   NA 137 12/22/2017 1047   K 3.3 (L) 12/22/2017 1047   CL 90 (L) 12/22/2017 1047   CO2 40 (H) 12/22/2017 1047   GLUCOSE 165 (H) 12/22/2017 1047   BUN 12 12/22/2017 1047   CREATININE 0.76 12/22/2017 1047   CALCIUM 10.9 (H) 12/22/2017 1047    BNP No results found for: BNP  ProBNP No results found for: PROBNP    Assessment & Plan:   Pleasant 47 year old completely one-week follow-up today.  Patient has completed sleep eval.  Patient to complete inlab sleep study.  Patient to get baseline lab work today.  Patient to increase Cytoxan dose on 01/02/2018.  Will have repeat lab work done in 1 week.  We will follow-up with Dr. [name] regarding the IV Cytoxan so patient could hopefully start therapy soon.  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 RaChase Caller Oxygen dependent Continue oxygen therapy as prescribed  Morbid obesity with BMI of 40.0-44.9, adult [facility]     BrLauraine RinneNP 12/30/2017

## 2017-12-30 NOTE — Patient Instructions (Addendum)
Continue cytoxan 35m per day              - drink lot of fluids to avoid cystitis             - avoid intercourse without contraception             - recheck cbc, bmet, lft, ua  We will follow up with you about IV cytoxan   01/02/18 >>> increase Cytoxan to 1062mdaily  Repeat lab work next week  Follow-up with our office in 2 weeks with Dr. RaChase Caller   November/2019 we will be moving! [place]  Be on the look out for a post card/mailer to let you know we have officially moved.  Our new address and phone number will be:  [id number] [id number] number: [phone] is flu season:   >>>Remember to be washing your hands regularly, using hand sanitizer, be careful to use around herself with has contact with people who are sick will increase her chances of getting sick yourself. >>> Best ways to protect herself from the flu: Receive the yearly flu vaccine, practice good hand hygiene washing with soap and also using hand sanitizer when available, eat a nutritious meals, get adequate rest, hydrate appropriately   Please contact the office if your symptoms worsen or you have concerns that you are not improving.   Thank you for choosing [facility] for your healthcare, and for allowing [facility] partner with you on your healthcare journey. I am thankful to be able to provide care to you today.   BrWyn QuakerNP-C

## 2018-01-02 ENCOUNTER — Ambulatory Visit: Payer: BLUE CROSS/BLUE SHIELD | Attending: Pulmonary Disease | Admitting: Pulmonary Disease

## 2018-01-02 DIAGNOSIS — G4733 Obstructive sleep apnea (adult) (pediatric): Secondary | ICD-10-CM | POA: Diagnosis present

## 2018-01-04 ENCOUNTER — Telehealth: Payer: Self-pay | Admitting: Pulmonary Disease

## 2018-01-04 NOTE — Procedures (Signed)
  POLYSOMNOGRAPHY  Last, First: [place], [place] MRN: [id number] Gender: Female Age (years): 47 Weight (lbs): 227 DOB: [birth year] BMI: 42 Primary Care: No PCP Epworth Score: 12 Referring: Laurin Coder MD Technician: Rosebud Poles Interpreting: Laurin Coder MD Study Type: NPSG Ordered Study Type: NPSG Study date: 01/02/2018 Location: [place] CLINICAL INFORMATION Deyja Sochacki is a 47 year old Female and was referred to the sleep center for evaluation of G47.33 OSA: Adult and Pediatric (327.23). Indications include N/A.  MEDICATIONS Patient self administered medications include: N/A. Medications administered during study include No sleep medicine administered.  SLEEP STUDY TECHNIQUE A multi-channel overnight Polysomnography study was performed. The channels recorded and monitored were central and occipital EEG, electrooculogram (EOG), submentalis EMG (chin), nasal and oral airflow, thoracic and abdominal wall motion, anterior tibialis EMG, snore microphone, electrocardiogram, and a pulse oximetry. TECHNICIAN COMMENTS Comments added by Technician: Patient had difficulty initiating sleep. Events increased in REM stages. O2 supplement started at 4.5 lpm and increased to 6 lpm Comments added by Scorer: N/A SLEEP ARCHITECTURE The study was initiated at 10:24:59 PM and terminated at 5:17:30 AM. The total recorded time was 412.5 minutes. EEG confirmed total sleep time was 306.8 minutes yielding a sleep efficiency of 74.4%%. Sleep onset after lights out was 31.7 minutes with a REM latency of 191.0 minutes. The patient spent 2.9%% of the night in stage N1 sleep, 56.8%% in stage N2 sleep, 24.1%% in stage N3 and 16.1% in REM. Wake after sleep onset (WASO) was 74.0 minutes. The Arousal Index was 12.9/hour. RESPIRATORY PARAMETERS There were a total of 18 respiratory disturbances out of which 10 were apneas ( 10 obstructive, 0 mixed, 0 central) and 8 hypopneas. The apnea/hypopnea  index (AHI) was 3.5 events/hour. The central sleep apnea index was 0.0 events/hour. The REM AHI was 18.2 events/hour and NREM AHI was 0.7 events/hour. The supine AHI was 0.0 events/hour and the non supine AHI was 3.9 supine during 8.91% of sleep. Respiratory disturbances were associated with oxygen desaturation down to a nadir of 83.0% during sleep. The mean oxygen saturation during the study was 95.3%. The cumulative time under 88% oxygen saturation was 5.5 minutes.  LEG MOVEMENT DATA The total leg movements were 352 with a resulting leg movement index of 68.8/hr .Associated arousal with leg movement index was 4.9/hr.  CARDIAC DATA The underlying cardiac rhythm was most consistent with sinus rhythm. Mean heart rate during sleep was 88.4 bpm. Additional rhythm abnormalities include PVCs.   IMPRESSIONS - No Significant Obstructive Sleep apnea(OSA) - Electrocardiographic data showed presence of PVCs. - Moderate Oxygen Desaturation-related to known lung disease - The patient snored with moderate snoring volume. - No significant periodic leg movements(PLMs) during sleep.    DIAGNOSIS - Nocturnal Hypoxemia (327.26 [G47.36 ICD-10])   RECOMMENDATIONS - Avoid alcohol, sedatives and other CNS depressants that may worsen sleep apnea and disrupt normal sleep architecture. - Sleep hygiene should be reviewed to assess factors that may improve sleep quality. - Weight management and regular exercise should be initiated or continued. - Continue oxygen supplementation.  [Electronically signed] 01/04/2018 08:22 PM  Sherrilyn Rist MD NPI: [id number]

## 2018-01-04 NOTE — Telephone Encounter (Signed)
Sleep study results  Date of studies 01/02/2018  Negative study for significant sleep disordered breathing Nocturnal desaturations  Continue with oxygen supplementation Routine follow-up as scheduled

## 2018-01-05 ENCOUNTER — Encounter: Payer: Self-pay | Admitting: Skilled Nursing Facility1

## 2018-01-05 ENCOUNTER — Encounter: Payer: BLUE CROSS/BLUE SHIELD | Attending: Pulmonary Disease | Admitting: Skilled Nursing Facility1

## 2018-01-05 DIAGNOSIS — Z713 Dietary counseling and surveillance: Secondary | ICD-10-CM | POA: Diagnosis present

## 2018-01-05 DIAGNOSIS — Z6841 Body Mass Index (BMI) 40.0 and over, adult: Secondary | ICD-10-CM | POA: Insufficient documentation

## 2018-01-05 DIAGNOSIS — J849 Interstitial pulmonary disease, unspecified: Secondary | ICD-10-CM | POA: Diagnosis present

## 2018-01-05 DIAGNOSIS — E099 Drug or chemical induced diabetes mellitus without complications: Secondary | ICD-10-CM

## 2018-01-05 NOTE — Progress Notes (Signed)
Assessment:  Primary concerns today: weight loss for lung transplant.   Physician's order dictates patients to lose weight to BMI 30, however pt states "[place]" requires her BMI be 35 or 36. For BMI 35, pt needs to lose 39 pounds from 224 (her wt today).   Pt arrives having lost 3 pounds from her prev physician visit 11/8  Pt arrives with her seemingly supportive mother. Pt arrives in well-form with very minimal coughing. Pt needed to change oxygen tank during visit. Pt was able to speak fluently without losing breath x1hr. Pt states she was supposed to meet with a dietitian from [place] (pt did not find those providers to be good fit) and has met with a dietitian when in rehab with [facility] in pulmonary rehab. Pt states she works all day long and does not eat until late afternoon. 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 Pt reports being a picky eater. Pt reports previously drinking a couple beers with her husband and picking at dinner. Pt reports that she loves beer but has not drank any in past few wks but chooses to not drink it anymore d/t motivation towards reaching weight loss goals. Pt states she has done weight watchers dropping about 20-30 pounds but then gained that weight back. Pt states she has been having trouble meeting protein needs by previous dietitian's recommendations d/t picky eating (stating she really enjoys beef but more recently does not seem to enjoy beef as much as before). Pt states she is a food snob and cant eat the supplement shit. Stated protein goal 90-110g a day. Pt states pulmonary rehab RD goals.  Pt states she was struggling with hyperglycemia before she discovered she had diabetes. Pt states she was given 5 months to live. Pt states she has a diuretic and is on chemo therapy and she has to drink 64 fluid ounces a day. Pt says she is thirsty all the time. Pt states she will continue on with pulmonary rehab exercises starting today.   BS numbers: 114/124 in the morning  Another  optional meal: Cereal (100 calories, 1g fat, 15 CHO, 11g)  MEDICATIONS: See List   DIETARY INTAKE: Usual eating pattern includes 6 small meals and 0 snacks per day.  Everyday foods include Optavia protein shakes and Optavia diet bars.  Avoided foods include foods high in simple/added sugars as well as complex CHO foods.    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   Intervention: Nutrition counseling. RD educated pt on appropriate needs to meet stated goals. Pt's current diet plan is aligned with current goals so no changes need to be made currently.  Goals: -do pulmonary rehab exercises as tolerated starting today. -continue to follow current diet -aim for 80oz fluid/d with 12-20oz from zero-calorie electrolyte drink -check bs at least 1x/d, 1 at fasting or 2hr after meal  Teaching Method Utilized:  Visual Auditory Hands on  Handouts given during visit include:  Non-starchy vegetables and protein  Barriers to learning/adherence to lifestyle change: medical diagnoses/work schedule  Demonstrated degree of understanding via:  Teach Back    Monitoring/Evaluation:  Dietary intake, exercise, and body weight PRN.

## 2018-01-05 NOTE — Patient Instructions (Signed)
-  Try

## 2018-01-06 ENCOUNTER — Other Ambulatory Visit (INDEPENDENT_AMBULATORY_CARE_PROVIDER_SITE_OTHER): Payer: BLUE CROSS/BLUE SHIELD

## 2018-01-06 DIAGNOSIS — Z5181 Encounter for therapeutic drug level monitoring: Secondary | ICD-10-CM

## 2018-01-06 LAB — CBC WITH DIFFERENTIAL/PLATELET
Basophils Absolute: 0.1 10*3/uL (ref 0.0–0.1)
Basophils Relative: 0.8 % (ref 0.0–3.0)
EOS PCT: 0.6 % (ref 0.0–5.0)
Eosinophils Absolute: 0.1 10*3/uL (ref 0.0–0.7)
HCT: 35.3 % — ABNORMAL LOW (ref 36.0–46.0)
Hemoglobin: 10.8 g/dL — ABNORMAL LOW (ref 12.0–15.0)
LYMPHS ABS: 1.2 10*3/uL (ref 0.7–4.0)
Lymphocytes Relative: 10.2 % — ABNORMAL LOW (ref 12.0–46.0)
MCHC: 30.5 g/dL (ref 30.0–36.0)
MCV: 79.9 fl (ref 78.0–100.0)
Monocytes Absolute: 0.6 10*3/uL (ref 0.1–1.0)
Monocytes Relative: 4.9 % (ref 3.0–12.0)
Neutro Abs: 9.6 10*3/uL — ABNORMAL HIGH (ref 1.4–7.7)
Neutrophils Relative %: 83.5 % — ABNORMAL HIGH (ref 43.0–77.0)
Platelets: 385 10*3/uL (ref 150.0–400.0)
RBC: 4.42 Mil/uL (ref 3.87–5.11)
RDW: 14.8 % (ref 11.5–15.5)
WBC: 11.5 10*3/uL — AB (ref 4.0–10.5)

## 2018-01-06 LAB — COMPREHENSIVE METABOLIC PANEL
ALBUMIN: 4.1 g/dL (ref 3.5–5.2)
ALK PHOS: 64 U/L (ref 39–117)
ALT: 26 U/L (ref 0–35)
AST: 20 U/L (ref 0–37)
BUN: 15 mg/dL (ref 6–23)
CO2: 34 mEq/L — ABNORMAL HIGH (ref 19–32)
Calcium: 9.9 mg/dL (ref 8.4–10.5)
Chloride: 92 mEq/L — ABNORMAL LOW (ref 96–112)
Creatinine, Ser: 0.71 mg/dL (ref 0.40–1.20)
GFR: 92.85 mL/min (ref >60.00)
Glucose, Bld: 144 mg/dL — ABNORMAL HIGH (ref 70–99)
Potassium: 4.3 mEq/L (ref 3.5–5.1)
SODIUM: 135 meq/L (ref 135–145)
TOTAL PROTEIN: 7.1 g/dL (ref 6.0–8.3)
Total Bilirubin: 0.3 mg/dL (ref 0.2–1.2)

## 2018-01-06 LAB — URINALYSIS
Bilirubin Urine: NEGATIVE
Ketones, ur: NEGATIVE
Leukocytes, UA: NEGATIVE
Nitrite: NEGATIVE
Specific Gravity, Urine: 1.015 (ref 1.000–1.030)
TOTAL PROTEIN, URINE-UPE24: NEGATIVE
URINE GLUCOSE: NEGATIVE
Urobilinogen, UA: 0.2 (ref 0.0–1.0)
pH: 7.5 (ref 5.0–8.0)

## 2018-01-06 LAB — HEPATIC FUNCTION PANEL
ALT: 26 U/L (ref 0–35)
AST: 20 U/L (ref 0–37)
Albumin: 4.1 g/dL (ref 3.5–5.2)
Alkaline Phosphatase: 64 U/L (ref 39–117)
BILIRUBIN TOTAL: 0.3 mg/dL (ref 0.2–1.2)
Bilirubin, Direct: 0 mg/dL (ref 0.0–0.3)
Total Protein: 7.1 g/dL (ref 6.0–8.3)

## 2018-01-10 ENCOUNTER — Telehealth: Payer: Self-pay | Admitting: Internal Medicine

## 2018-01-10 MED ORDER — CYCLOPHOSPHAMIDE 25 MG PO CAPS: 125.0000 mg | ORAL_CAPSULE | Freq: Every day | ORAL | 1 refills | Status: DC

## 2018-01-10 NOTE — Telephone Encounter (Signed)
Please tell her I recommend app called MYFITNESSPAL - where she can log everything  Also, the surgeon I spoke to is Dr Kaylyn Lim of [facility] - can you please ensure there is appt at [place] with either him or Dr Jacinto Reap  Also, she should talk to PCP [name], MD about any weight loss drugs she can start  And did [facility] in [place] call her yet about transplant?

## 2018-01-10 NOTE — Telephone Encounter (Signed)
Note from MR from 12/30/17   Received call from MR. Per MR we are not going to start the patient on IV cytoxan at this time. Patient has started the cytoxan oral at this time and if anything changes MR will let us know. Will route to MR as FYI.      Called and spoke with pt who stated due to the increase from the 59m cytoxan to now her taking 106m she will be running out of the 5047mytoxan in two days.  Pt wants to know if she should still be on the 100m92m if it is now to be increased to 150mg62mt stated she just had labwork done and wanted to know the results of the labwork and wants to know if based on the results of the labwork if the dose should be increased.  I also stated to pt that MR had said for her not to start the IV cytoxan and to continue the oral but pt stated [place] who wanted her to try to transition from the oral to the IV due to the IV having less harm on her kidneys.  MR, please advise on all the above for pt. Thanks!

## 2018-01-10 NOTE — Telephone Encounter (Signed)
Called and spoke with pt letting her know the results of the sleep study and stated to her to continue with her O2. Pt expressed understanding. Nothing further needed.

## 2018-01-10 NOTE — Telephone Encounter (Signed)
Called and spoke with pt letting her know the information per MR. I stated to pt that I would send a new Rx to the preferred pharmacy for her.  Pt stated to me she she has continued to lose some weight. She stated to me as of today, she has lost about 8 more pounds.  Pt stated she spoke with a surgeon in regards to weight loss but has not actually met with them yet. She is waiting for them to call her back.  Pt stated she is not using an app to help with the weight loss and states she has just been using her scale.

## 2018-01-10 NOTE — Telephone Encounter (Signed)
She was started on cytoxan 39m per day 12/23/17 and increaed to 1034mon 12/23/17. Today is 01/10/18. She can increase to 12539mer day . Check labs in 7 days and again in 14 days. I will only increase further if she can be stable at 125m54mr day at 14 days on this dose  Regarding labs from 01/06/18 - they are fine  Regarding  IV cytoxan - there is plans to start it. [facility]. Let ChriDina Richw that pharmacy is on board. And now I will be sending you istructions this week to activate orders. It is possible that we start IV cytoxan next 1-2 weeks itself and after that no need for po cytoxan  Also, please ask her  - how she is doing with weight loss. She is to use myfitnesspal app - did she surgeon for weight loss? - I d./w D rMatt MartHassell Donet week -   - [facility]?   Thanks    SIGNATURE    Dr. MuraBrand MalesD., F.C.C.P,  Pulmonary and Critical Care Medicine Staff Physician, [facility]  [place]ulmonary [place], [id number]: [phone] no answer or between  15:00h - 7:00h: call [phone] Telephone: [phone]  4:33 PM 01/10/2018

## 2018-01-11 MED FILL — CYCLOPHOSPHAMIDE 25 MG CAPS: 25 | 30 days supply | Qty: 150 | Fill #0

## 2018-01-11 NOTE — Telephone Encounter (Signed)
Called and spoke to patient, patient aware of MR recommendations. Patient is requesting that MR call [facility] personally. She states they are not giving her any direct answers and she would talk with MR more about this at her office visit next week. She states she will download the app fore recording. Nothing further needed at this time.

## 2018-01-11 NOTE — Telephone Encounter (Signed)
Patient returned phone call; contact # [phone]

## 2018-01-11 NOTE — Telephone Encounter (Signed)
Attempted to call pt but unable to reach her. Left message for pt to return call. 

## 2018-01-13 NOTE — Telephone Encounter (Signed)
Patient started on oral cytoxin managed by MR. Nothing further needed at this time.

## 2018-01-16 ENCOUNTER — Ambulatory Visit: Payer: BLUE CROSS/BLUE SHIELD | Admitting: Dietician

## 2018-01-16 NOTE — Addendum Note (Signed)
Encounter addended by: Ivonne Andrew, RD on: 01/16/2018 11:43 AM  Actions taken: Flowsheet data copied forward, Visit Navigator Flowsheet section accepted

## 2018-01-17 ENCOUNTER — Encounter: Payer: Self-pay | Admitting: Internal Medicine

## 2018-01-17 ENCOUNTER — Ambulatory Visit (INDEPENDENT_AMBULATORY_CARE_PROVIDER_SITE_OTHER): Payer: BLUE CROSS/BLUE SHIELD | Admitting: Internal Medicine

## 2018-01-17 VITALS — BP 102/70 | HR 94 | Ht 61.0 in | Wt 221.0 lb

## 2018-01-17 DIAGNOSIS — Z6841 Body Mass Index (BMI) 40.0 and over, adult: Secondary | ICD-10-CM

## 2018-01-17 DIAGNOSIS — J84112 Idiopathic pulmonary fibrosis: Secondary | ICD-10-CM

## 2018-01-17 DIAGNOSIS — G4733 Obstructive sleep apnea (adult) (pediatric): Secondary | ICD-10-CM

## 2018-01-17 DIAGNOSIS — J9611 Chronic respiratory failure with hypoxia: Secondary | ICD-10-CM | POA: Diagnosis not present

## 2018-01-17 DIAGNOSIS — Z79899 Other long term (current) drug therapy: Secondary | ICD-10-CM | POA: Diagnosis not present

## 2018-01-17 LAB — URINALYSIS
Bilirubin Urine: NEGATIVE
HGB URINE DIPSTICK: NEGATIVE
Ketones, ur: NEGATIVE
LEUKOCYTES UA: NEGATIVE
NITRITE: NEGATIVE
Specific Gravity, Urine: <=1.005 — AB (ref 1.000–1.030)
Total Protein, Urine: NEGATIVE
UROBILINOGEN UA: 0.2 (ref 0.0–1.0)
Urine Glucose: NEGATIVE
pH: 5.5 (ref 5.0–8.0)

## 2018-01-17 LAB — BASIC METABOLIC PANEL
BUN: 13 mg/dL (ref 6–23)
CALCIUM: 10.2 mg/dL (ref 8.4–10.5)
CO2: 34 mEq/L — ABNORMAL HIGH (ref 19–32)
Chloride: 92 mEq/L — ABNORMAL LOW (ref 96–112)
Creatinine, Ser: 0.71 mg/dL (ref 0.40–1.20)
GFR: 92.84 mL/min (ref >60.00)
GLUCOSE: 144 mg/dL — AB (ref 70–99)
POTASSIUM: 3.9 meq/L (ref 3.5–5.1)
SODIUM: 136 meq/L (ref 135–145)

## 2018-01-17 LAB — CBC WITH DIFFERENTIAL/PLATELET
BASOS ABS: 0.1 10*3/uL (ref 0.0–0.1)
Basophils Relative: 0.5 % (ref 0.0–3.0)
EOS PCT: 0.7 % (ref 0.0–5.0)
Eosinophils Absolute: 0.1 10*3/uL (ref 0.0–0.7)
HEMATOCRIT: 34.9 % — AB (ref 36.0–46.0)
HEMOGLOBIN: 10.8 g/dL — AB (ref 12.0–15.0)
Lymphs Abs: 0.7 10*3/uL (ref 0.7–4.0)
MCHC: 31 g/dL (ref 30.0–36.0)
MCV: 79 fl (ref 78.0–100.0)
MONOS PCT: 4.5 % (ref 3.0–12.0)
Monocytes Absolute: 0.6 10*3/uL (ref 0.1–1.0)
Neutro Abs: 12.1 10*3/uL — ABNORMAL HIGH (ref 1.4–7.7)
Neutrophils Relative %: 89.2 % — ABNORMAL HIGH (ref 43.0–77.0)
Platelets: 387 10*3/uL (ref 150.0–400.0)
RBC: 4.42 Mil/uL (ref 3.87–5.11)
RDW: 15.3 % (ref 11.5–15.5)
WBC: 13.6 10*3/uL — ABNORMAL HIGH (ref 4.0–10.5)

## 2018-01-17 LAB — HEPATIC FUNCTION PANEL
ALBUMIN: 4.2 g/dL (ref 3.5–5.2)
ALT: 22 U/L (ref 0–35)
AST: 17 U/L (ref 0–37)
Alkaline Phosphatase: 61 U/L (ref 39–117)
BILIRUBIN TOTAL: 0.4 mg/dL (ref 0.2–1.2)
Bilirubin, Direct: 0 mg/dL (ref 0.0–0.3)
Total Protein: 7.2 g/dL (ref 6.0–8.3)

## 2018-01-17 NOTE — Patient Instructions (Addendum)
ICD-10-CM   1. IPF (idiopathic pulmonary fibrosis) ([place]) J84.112   2. Chronic respiratory failure with hypoxia (HCC) J96.11   3. Encounter for long-term current use of high risk medication Z79.899   4. Morbid obesity with BMI of 40.0-44.9, adult ([place]) E66.01    Z68.41   5. OSA (obstructive sleep apnea) G47.33    IPF (idiopathic pulmonary fibrosis) (HCC) Chronic respiratory failure with hypoxia (HCC) Encounter for long-term current use of high risk medication   -  You are behaving like IPF - Continue oral cytoxan 171m per day dose (started 01/10/18)  - check CBC, bmet, lft and UA 01/17/2018   - next week will increase  Cytoxan dose based on results - refer IR for portocath placement  - will work on IV cytoxan protocol  - start Ofev 1533mtwice daily   - I have emailed [facility] will let you know about their interest in your for transplant  Morbid obesity with BMI of 40.0-44.9, adult ([facility]- congratulations on weight loss. But continue to keep up good worl   - take [facility] low glycemic sheet  - stick to foods in left lane only - refer Dr MaKaylyn Limf [facility] - I have d/w him. Our office to have him see you outside of normal algorithm for bariatric surgery - ask your PCP EsRubie MaidMD about what weight loss drugs he is comfortable. I will [place] out [place] [facility] [place] - If needed will refer to endocrinologist  OSA (obstructive sleep apnea) - per Dr OlGala MurdochFollowup 30 min slot ILD clinic or regular clinic in 4-6 weeks - if none available then they can use a research slot to book you

## 2018-01-17 NOTE — Progress Notes (Signed)
System, Pcp Not In   HPI  IOV 11/22/2017  Chief Complaint  Patient presents with  . Consult    consut due to pulmonary fibrosis. Pt has been followed by [facility] and last PFT was peformed 8/14 at [facility]. Pt does have complaints of SOB with exertion which she states has become better due to rehab. Pt also has a dry hacking cough with mild production of mucus. Denies any CP.    Is seen by presents to the ILD clinic.  This is her first visit with Tasha Ballard.  History is obtained from her and review of the Tasha Ballard where she is to follow-up with Dr. Hortencia Pilar.  Interstitial lung disease clinic integrated interstitial lung disease questionnaire: Symptoms: Shortness of breath for years.  Gradually worse.  Currently level 5 shortness of breath walking up stairs or walking up a hill with oxygen.  Level for shortness of breath for sweeping or vacuuming and level 3 shortness of breath for shopping or picking up things and doing laundry.  Level 3 shortness of breath or standing up from a chair or taking a shower making the bed and level 1 dyspnea for brushing teeth.  She also has a cough for 6 years which is getting worse.  On and off that is phlegm.  Occasional wheezing present there is associated fatigue.  She tells me that she had undiagnosed and unrecognized ILD for years.  This was then picked up at [facility] and then she got referred to [facility].  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  At this time she was placed on CellCept.  Then in 2016 she got somewhat worse and was placed on 2 L nasal cannula continuously.  In 2017 she tells me that she had a aortic aneurysm and this was repaired at [facility].  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  And then for the last year and a half or so she has been on significantly worsening hypoxemia.  In the  spring 2019 she is requiring 8 L at rest and 15 L at exertion.  Apparently Dr. Lauris Chroman then recommended Rituxan infusions which she got to 60 days apart with the last one being approximately in April of May 2019.  After that she is somewhat improved requiring only 9-10 L with exertion.  She has been attending pulmonary rehabilitation which has been helping.  She is obese and unable to lose weight although she is lost some.  She believes a lot of her oxygen needs might be related to deviated nasal septum/septal perforation.  She subsequently followed up with Dr. Ruthann Cancer at [facility] in the summer 2019.  I reviewed the notes.  Palliative care was recommended because of progressive respiratory failure.  She has never seen the transplant clinic at [facility] but she recommends that because of obesity and diaphragmatic dysfunction that she would not be a good candidate for transplant .  She was then frustrated with experience because she wants to fight the disease.  She then drove to [facility] and saw Dr. Elisabeth Cara at [facility] 1 week ago.  She spent substantial number of days there.  At this point in time she showed me an email from him that suggest he might have myositis-ILD but they would like to review her CT scans and pathology from [facility].  Apparently she had  significant amount of blood work and other tests at [facility]  I do not have access to these records at this point.  She believes that she wants to undergo transplant.  She thinks that she would be able to have transplant in [place] despite a high BMI.  Currently in [place] she is somewhat stable attending pulmonary rehabilitation.  She is interested in pulmonary trials  Past medical history  -Tested for sleep apnea at [facility] and was told to be negative.  But at [facility] in September 2019 they recommended a repeat sleep study -Denied all forms of connective tissue disease diagnosis and vasculitis.   Although at [facility] September 2019 when she was seen last week she was told she might have myositis-ILD but this is pending confirmation in the multidisciplinary case conference -She does report positive for diabetes and thyroid disease and mononucleosis in the past but denies any tuberculosis or kidney disease   Review of systems -Positive for fatigue for a few years, arthralgia for a few years but denies dysphagia.  Positive for dry eyes and mouth for few years.  No Raynaud.  No recurrent fever.  No weight loss.  No acid reflux but does admit to snoring for a few years.  No rash no ulcers.  Family history of lung disease COPD and a long but no pulmonary fibrosis.  Dad did have rheumatoid arthritis  Personal exposure history Denies tobacco, vaping, marijuana, cocaine, IV drugs  Home and hobby history  [place] single[place] home which is [place] years old  Occupational history -She does stay in a condition spaces.  122 point occupational questionnaire completely negative  Medication history -She has a history of allergy to Macrobid/nitrofurantoin.  Do not know when she took it.  She is currently on immunosuppressive regimen of CellCept, Bactrim and prednisone.  CellCept since 2013 and prednisone since 2012.  She did get Rituxan x2 doses with the last one being in spring 2019.  She feels her hypoxemia improved somewhat after that.     has a past medical history of Aneurysm ([place]), Diabetes mellitus without complication ([place]), and Thyroid disease.   reports that she has never smoked. She has never used smokeless tobacco.   OV 12/21/2017  Subjective:  Patient ID: Tasha Ballard, female , DOB: May 09, 1970 , age 47 y.o. , MRN: [id number] , ADDRESS: [address] [place] [address]   12/21/2017 -   Chief Complaint  Patient presents with  . Follow-up    ILD     HPI [name] 47 y.o. -returns for follow-up.  She brings her mother along who I am meeting for the  first time.  This visit is specifically focused on evaluation from [place].  Yesterday I did speak to the interstitial lung disease specialist at [facility] Dr. Threasa Alpha.  He told me he was finally able to receive and review the surgical lung biopsy from [facility].  He and his pathologist agreed with the diagnosis of progressive NSIP.  His main recommendation was to switch patient to Cytoxan.  In addition also aggressively pursue weight loss even if it means for bariatric surgery approach so we can get ready for transplant.  He agreed the prognosis is very poor.  At this point in time patient compared to the last visit is stable.  She is using more than 10 L of oxygen up to 15 L of oxygen.  She is interested in Cytoxan.  Dr. Elisabeth Cara  indicated to me that efficacy wise oral and IV are similar but he felt IV would be a little bit safer from a urologic standpoint.  Patient is interested in weight loss.  She says she has had difficulty losing weight despite being hypocaloric.  She is interested in nutritional referral.  In addition she wants a sleep specialist referral.  She says [place] OSA was ruled out.  She has significant insomnia and daytime tiredness.  She said that [place] recommended sleep referral ASAP.  She is registered to participate in the ILD-pro registry study for progressive non-IPF disease.  This was done today.   Investigations at [facility] -November 09, 2017: Echocardiogram ejection fraction 70-75% with grade 2 diastolic dysfunction.  Right ventricle was mildly enlarged but global RV function was felt to be normal.  No pericardial effusion no obvious right-to-left shunt at rest or cough or Valsalva on agitated saline contrast exam and stage I right ventricular diastolic dysfunction.  Pulmonary artery systolic pressure at 45 mmHg  -Lung function November 08, 2017: FVC 0.9 L / 27%, DLCO 5.86/29%  - HRCT 11/08/17 Rt diaphragm elevation ? Eventration  v paresis. Sniff test recommendd  Esophagogram November 11, 2017 - > 11/11/17 Natil Jewsh - Normal    12/22/2017-telephone encounter - Dr. [name] >>>starting Cytoxan 50 mg/day >>>Avoid lots of fluids to avoid cystitis, avoid intercourse without contraception, return in 7 to 10 days to recheck CBC, Bmet, LFTs, UA  12/22/17-office visit-[place] >>>Plan in lab sleep study be ordered, patient will likely need BiPAP with oxygen  12/30/2017  - Visit   47 year old female patient presenting today for one-week follow-up visit after starting Cytoxan.  Patient also get lab work completed today.  Unfortunately patient did not get this completed prior to her office visit she will get afterwards.  Patient reports baseline symptoms of nonproductive cough.  Requiring 4 to 6 L of oxygen at rest as well as 10 to 15 L of oxygen with exertion.  Patient does have her baseline shortness of breath with exertion.  Patient reporting no worsening symptoms since starting Cytoxan.   Patient reports she typically is on 9 to 10 L of oxygen at home.   OV 01/17/2018  Subjective:  Patient ID: Tasha Ballard, female , DOB: 07/07/1970 , age 30 y.o. , MRN: [id number] , ADDRESS: [address] [place] [address]   01/17/2018 -   Chief Complaint  Patient presents with  . Follow-up    2 week f/u, lung transplant, was contacted and waiting on weight loss to continue   PRogressive NSIP Chronic hypoxemic resp failure Morbid Obesity - needs weight loss for transplant  HPI DIMOND CROTTY 47 y.o. -presents for follow-up with her husband.  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 Threasa Alpha at [facility].  She was started on cytoxan 52m per day 12/23/17 and increaed to 1016mon 12/23/17.  On 01/10/18.  increase to 12545mer day .  Plan is to get blood work today.  She wants a Port-A-Cath placed.  She prefers IV Cytoxan.  I checked with Dr.  JosThreasa Alphad he feels either p.o. or IV Cytoxan is fine but in the balance IV Cytoxan has a slightly lower incidence of hemorrhagic cystitis and the patient prefers this.  At this point in time she tells me that the Cytoxan seems to be working well for her.  She is less hypoxemic she is able to  get by with 5 L at rest.  On the other hand this could be because of weight loss.  However there is some fatigue.  She will have blood work and urine analysis to look for cytotoxicity from Cytoxan.  Based on the newINBUILD  data we discussed about starting nintedanib.  I checked with Dr. Threasa Alpha at [facility] and he is supportive of the plan.  Patient states that she has done extensive research on nintedanib and she is very excited about the possibility of taking this.   Obesity: Weight 231#  11/22/17  -> 221# 01/17/2018.,  She saw the nutritionist at [facility].  She is using an app and tracking her weight on a regular basis.  She is encouraged by the weight loss.  We gave her a low glycemic diet sheet.  She has not been able to see the bariatric surgeon at [facility] surgery but I did run into Dr. Kaylyn Lim in the hallway and he said he would be happy to see her and counsel her.  Although he did acknowledge she would be high risk for surgery.  He is in favor of low carbohydrate diet.  A few weeks ago I met the transplant physician from [facility] and he brought up the idea about losing weight loss drugs.  His preferred drug is phentermine.  We discussed the possibility about getting an endocrine consult  Lung transplant: [facility] has called her.  They want to BMI less than 30 or 36 I am not sure.  She is working towards his goal.  She has not heard from [facility].  I emailed them and the day after the visit patient is emailed me saying that she has heard from them.  She is very keen to get on the transplant list.    ROS - per HPI     has a past medical  history of Aneurysm ([place]), Diabetes mellitus without complication ([place]), and Thyroid disease.   reports that she has never smoked. She has never used smokeless tobacco.  No past surgical history on file.  Allergies  Allergen Reactions  . Macrobid [Nitrofurantoin Macrocrystal] Nausea And Vomiting    Immunization History  Administered Date(s) Administered  . Influenza,inj,Quad PF,6+ Mos 12/24/2014, 02/05/2016, 01/07/2017, 11/22/2017  . Pneumococcal Conjugate-13 07/20/2017  . Pneumococcal Polysaccharide-23 06/09/2012    No family history on file.   Current Outpatient Medications:  .  acyclovir (ZOVIRAX) 400 MG tablet, Take 400 mg by mouth 3 (three) times daily., Disp: , Rfl:  .  apixaban (ELIQUIS) 5 MG TABS tablet, Take 5 mg by mouth 2 (two) times daily., Disp: , Rfl:  .  benzonatate (TESSALON) 100 MG capsule, Take by mouth 3 (three) times daily as needed for cough., Disp: , Rfl:  .  carvedilol (COREG) 6.25 MG tablet, Take 6.25 mg by mouth 2 (two) times daily with a meal., Disp: , Rfl:  .  cholecalciferol (VITAMIN D) 400 units TABS tablet, Take 800 Units by mouth. Pt takes 1,000 units daily, Disp: , Rfl:  .  cyclophosphamide (CYTOXAN) 25 MG capsule, Take 5 capsules (125 mg total) by mouth daily. Give on an empty stomach 1 hour before or 2 hours after meals., Disp: 150 capsule, Rfl: 1 .  furosemide (LASIX) 40 MG tablet, Take 40 mg by mouth daily. 40 mg daily but on days of swelling twice a day, Disp: , Rfl:  .  metFORMIN (GLUCOPHAGE) 500 MG tablet, Take by mouth 2 (two) times daily with  a meal., Disp: , Rfl:  .  montelukast (SINGULAIR) 10 MG tablet, Take 10 mg by mouth daily after supper., Disp: , Rfl:  .  predniSONE (DELTASONE) 10 MG tablet, Take 10-26m as needed for SOB, Disp: 30 tablet, Rfl: 3 .  spironolactone (ALDACTONE) 100 MG tablet, Take 100 mg by mouth daily., Disp: , Rfl:  .  sulfamethoxazole-trimethoprim (BACTRIM) 400-80 MG tablet, Once a day while on prednisone, Disp: 90  tablet, Rfl: 1 .  Sulfamethoxazole-Trimethoprim (SEPTRA DS PO), Take by mouth daily. 400-80  Once a day while on prednisone, Disp: , Rfl:  .  thyroid (ARMOUR) 30 MG tablet, Take 60 mg by mouth daily before breakfast. Takes 60 mg tablet, Disp: , Rfl:       Objective:   Vitals:   01/17/18 0927  BP: 102/70  Pulse: 94  SpO2: 100%  Weight: 221 lb (100.2 kg)  Height: _0  (1.549 m)    Estimated body mass index is 41.76 kg/m as calculated from the following:   Height as of this encounter: _1  (1.549 m).   Weight as of this encounter: 221 lb (100.2 kg).  _2 @  FAutoliv  01/17/18 0927  Weight: 221 lb (100.2 kg)     Physical Exam  general Appearance:    Alert, cooperative, no distress, appears stated age - yes , Deconditioned looking - no , OBESE  - yes but slimmer, Sitting on Wheelchair -  no  Head:    Normocephalic, without obvious abnormality, atraumatic  Eyes:    PERRL, conjunctiva/corneas clear,  Ears:    Normal TM's and external ear canals, both ears  Nose:   Nares normal, septum midline, mucosa normal, no drainage    or sinus tenderness. OXYGEN ON  - yes . Patient is @ 5L [place]   Throat:   Lips, mucosa, and tongue normal; teeth and gums normal. Cyanosis on lips - no  Neck:   Supple, symmetrical, trachea midline, no adenopathy;    thyroid:  no enlargement/tenderness/nodules; no carotid   bruit or JVD  Back:     Symmetric, no curvature, ROM normal, no CVA tenderness  Lungs:     Distress - no , Wheeze no, Barrell Chest - no, Purse lip breathing - no, Crackles - yes . Cough while talking is better   Chest Wall:    No tenderness or deformity.    Heart:    Regular rate and rhythm, S1 and S2 normal, no rub   or gallop, Murmur - no  Breast Exam:    NOT DONE  Abdomen:     Soft, non-tender, bowel sounds active all four quadrants,    no masses, no organomegaly. Visceral obesity - yes  Genitalia:   NOT DONE  Rectal:   NOT DONE  Extremities:   Extremities - normal,  Has Cane - yes, Clubbing - no, Edema - no  Pulses:   2+ and symmetric all extremities  Skin:   Stigmata of Connective Tissue Disease - no  Lymph nodes:   Cervical, supraclavicular, and axillary nodes normal  Psychiatric:  Neurologic:   Pleasant - yes, Anxious - no, Flat affect - no  CAm-ICU - neg, Alert and Oriented x 3 - yes, Moves all 4s - yes, Speech - normal, Cognition - intact           Assessment:       ICD-10-CM   1. IPF (idiopathic pulmonary fibrosis) ([place] J84.112   2. Chronic respiratory failure with hypoxia (HCC) J96.11  3. Encounter for long-term current use of high risk medication Z79.899   4. Morbid obesity with BMI of 40.0-44.9, adult ([place]) E66.01    Z68.41   5. OSA (obstructive sleep apnea) G47.33        Plan:     Patient Instructions     ICD-10-CM   1. IPF (idiopathic pulmonary fibrosis) ([place]) J84.112   2. Chronic respiratory failure with hypoxia (HCC) J96.11   3. Encounter for long-term current use of high risk medication Z79.899   4. Morbid obesity with BMI of 40.0-44.9, adult ([place]) E66.01    Z68.41   5. OSA (obstructive sleep apnea) G47.33    IPF (idiopathic pulmonary fibrosis) (HCC) Chronic respiratory failure with hypoxia (HCC) Encounter for long-term current use of high risk medication   -  You are behaving like IPF - Continue oral cytoxan 168m per day dose (started 01/10/18)  - check CBC, bmet, lft and UA 01/17/2018   - next week will increase  Cytoxan dose based on results - refer IR for portocath placement  - will work on IV cytoxan protocol  - start Ofev 1561mtwice daily   - I have emailed [facility] will let you know about their interest in your for transplant  Morbid obesity with BMI of 40.0-44.9, adult ([place]- congratulations on weight loss. But continue to keep up good worl   - take [facility] low glycemic sheet  - stick to foods in left lane only - refer Dr MaKaylyn Limf [facility] - I have d/w him. Our office to have him  see you outside of normal algorithm for bariatric surgery - ask your PCP EsRubie MaidMD about what weight loss drugs he is comfortable. I will find out from [facility] they prefer - If needed will refer to endocrinologist  OSA (obstructive sleep apnea) - per Dr OlGala MurdochFollowup 30 min slot ILD clinic or regular clinic in 4-6 weeks - if none available then they can use a research slot to book you     > 50% of this > 40 min visit spent in face to face counseling or/and coordination of care - by this undersigned MD - Dr MuBrand MalesThis includes one or more of the following documented above: discussion of test results, diagnostic or treatment recommendations, prognosis, risks and benefits of management options, instructions, education, compliance or risk-factor reduction    SIGNATURE    Dr. MuBrand MalesM.D., F.C.C.P,  Pulmonary and Critical Care Medicine Staff Physician, [place]irector - Interstitial Lung Disease  Program  Pulmonary [id number]: [phone] no answer or between  15:00h - 7:00h: call [phone] Telephone: [phone]  10:03 AM 01/17/2018

## 2018-01-18 ENCOUNTER — Telehealth: Payer: Self-pay | Admitting: Internal Medicine

## 2018-01-18 ENCOUNTER — Encounter: Payer: Self-pay | Admitting: Internal Medicine

## 2018-01-18 DIAGNOSIS — Z5181 Encounter for therapeutic drug level monitoring: Secondary | ICD-10-CM

## 2018-01-18 DIAGNOSIS — J84112 Idiopathic pulmonary fibrosis: Secondary | ICD-10-CM

## 2018-01-18 NOTE — Telephone Encounter (Signed)
Called pt to give her the results and other necessary information but while speaking to pt, the call got disconnected.  Tried to call pt back but unable to reach her.  Left message for pt to return call.

## 2018-01-18 NOTE — Telephone Encounter (Signed)
Randlett ok. Next time we check UA need cell count - not just dipstick.   Plan - repeat ccbc, bmet, lft, ua with cell count I 2 weeks - let her know I am working on IV cytoxan  -      LABS    PULMONARY No results for input(s): PHART, PCO2ART, PO2ART, HCO3, TCO2, O2SAT in the last 168 hours.  Invalid input(s): PCO2, PO2  CBC Recent Labs  Lab 01/17/18 1039  HGB 10.8*  HCT 34.9*  WBC 13.6*  PLT 387.0    COAGULATION No results for input(s): INR in the last 168 hours.  CARDIAC  No results for input(s): TROPONINI in the last 168 hours. No results for input(s): PROBNP in the last 168 hours.   CHEMISTRY Recent Labs  Lab 01/17/18 1039  NA 136  K 3.9  CL 92*  CO2 34*  GLUCOSE 144*  BUN 13  CREATININE 0.71  CALCIUM 10.2   Estimated Creatinine Clearance: 92.4 mL/min (by C-G formula based on SCr of 0.71 mg/dL).   LIVER Recent Labs  Lab 01/17/18 1039  AST 17  ALT 22  ALKPHOS 61  BILITOT 0.4  PROT 7.2  ALBUMIN 4.2     INFECTIOUS No results for input(s): LATICACIDVEN, PROCALCITON in the last 168 hours.   ENDOCRINE CBG (last 3)  No results for input(s): GLUCAP in the last 72 hours.       IMAGING x48h  - image(s) personally visualized  -   highlighted in bold No results found.

## 2018-01-19 ENCOUNTER — Other Ambulatory Visit: Payer: Self-pay | Admitting: Radiology

## 2018-01-20 ENCOUNTER — Telehealth: Payer: Self-pay | Admitting: Internal Medicine

## 2018-01-20 NOTE — Telephone Encounter (Signed)
Medication name and strength: Ofev 163m capsules Provider: MR [facility] ID: [id number]: [phone]: [phone] Was the PA started on CMM?  Yes  If yes, please enter the Key: [id number] Timeframe for approval/denial: Approved  PA was instantly approved. ATornillois aware of the approval. Nothing further needed at time of call.

## 2018-01-20 NOTE — Telephone Encounter (Signed)
Patient returning call.  States her cell phone does not work while she is at home.  So if cannot reach her on cell [phone], try her at home [phone].

## 2018-01-20 NOTE — Telephone Encounter (Signed)
Called and spoke with patient she is aware of results and verbalized understanding. Orders have been placed. Patient goes 12.2.19 for port placement. Nothing further needed.

## 2018-01-23 ENCOUNTER — Telehealth: Payer: Self-pay | Admitting: Internal Medicine

## 2018-01-23 ENCOUNTER — Ambulatory Visit (HOSPITAL_COMMUNITY): Admission: RE | Admit: 2018-01-23 | Payer: BLUE CROSS/BLUE SHIELD | Source: Ambulatory Visit

## 2018-01-23 DIAGNOSIS — Z5181 Encounter for therapeutic drug level monitoring: Secondary | ICD-10-CM

## 2018-01-23 NOTE — Telephone Encounter (Signed)
  Emily  Please order for cytoxan infusion at [facility] long as below. The following protocol has been discussed with Mr Clayburn Pert, Demetrius Charity of [place] and they are willing to provide the medication. Please call [facility] short stay infusion center to set this up. [facility] does NOT do this because is considered chemo   Schedule needs to be every 3-4 weeks. Please ask [facility] if they can accommodate this  Thanks     SIGNATURE    Dr. Brand Males, M.D., F.C.C.P,  Pulmonary and Critical Care Medicine Staff Physician, [place] Director - Interstitial Lung Disease  Program  Pulmonary [place] at [place], [place], [address]  Pager: [phone], If no answer or between  15:00h - 7:00h: call [phone] Telephone: [phone]  9:29 AM 01/23/2018

## 2018-01-24 ENCOUNTER — Ambulatory Visit (HOSPITAL_COMMUNITY): Payer: BLUE CROSS/BLUE SHIELD

## 2018-01-24 NOTE — Telephone Encounter (Signed)
Tasha Ballard  Here is the cytoxan protocol - please arrange q4 weeks at [facility]     [facility] IV Cyclophosphamide Protocol - Modified from [facility] Protocol  Nov 2019, Version 1.0  Pre-dosing 1. Give outpatient script for Zofran - from Daniel - MD to sign this- Zofran  50m every 8h as needed for 4 weeks with 3 refills 2. [place] can administer protocol 3. [place] protocol [facility] because [facility]   Dosing and Infusion and Schedule of IV Cyclophosphamide: 1. Determine BSA (body surface area) in m2= the square root of [ ht (cm) X wt (kg)] divided by 3600  2. Draw STAT CBC, CMP, UA prior to infusion - > results to be paged to MD prior to infusion start  3. Ensure adequate hydration: D5  NS _0  cc/hr for 2 hours  4. To help prevent nausea and vomiting give:  .Marland KitchenHydrocortisone 1070mIV x 1: 1-2h prior to infusion . Zofran 75m21mo 30 minutes prior to cyclophosphamide infusion then . Zofran 75mg69m 4 hours and 8 hours post infusion  Other options that MD can be paged for: . Ativan 1mg 21m. Compazine 15mg 675m Decadron 10mg I775m one time dose  5. To prevent bladder toxicity MESNA should be given:  . MesnaMarland Kitchen25% of total cyclophosphamide in mg, IV 30 minutes prior to cyclophosphamide . Repeat Mesna 25% of cyclophosphamide dose at 1 hour post infusion  6.  Page MD with results of blood work before starting IV infusion of cyclophosphamide  7.  Infuse IV cyclophosphamide  (0.5gm-1gm/m2) in 150cc D5W over 1 hour . (If GFR <1/3 start at 0.5gm/m2  dose, otherwise start at 0.75gm/m2 ). From 2nd dose if well tolerated and GFR ok can administer for 1gm/m2  8. Continue D5  NS at 500 cc/hr for 1 hour  9. Monitor WBC in 10-14 days   . If <1500 reduce dose by 0.25g/m2 . If > 4000  increase dose by max of 1 gm/m2 (i.e. max dose of IV cyclophosphamide is 2 gm/m2 )           9. Schedule: Every 4 weeks       10. Patient should USE contraception at all time for intercourse      SIGNATURE    Dr. Anum Palecek Brand Males F.C.C.P,  Pulmonary and Critical Care Medicine Staff Physician, [facility]  [facility]  Pulmonary [place]  [phone]: [phone] answer or between  15:00h - 7:00h: call [phone] Telephone: [phone]  10:18 AM 01/24/2018

## 2018-01-24 NOTE — Telephone Encounter (Signed)
Routing to [employer]. Tasha Ballard is out this afternoon. poatient is getting portocath and last thing I want is [place] infusion center to say no but patient has portocath

## 2018-01-25 ENCOUNTER — Ambulatory Visit: Payer: BLUE CROSS/BLUE SHIELD | Admitting: Skilled Nursing Facility1

## 2018-01-25 ENCOUNTER — Other Ambulatory Visit: Payer: Self-pay | Admitting: Physician Assistant

## 2018-01-25 MED ORDER — ONDANSETRON HCL 8 MG PO TABS: 8.0000 mg | ORAL_TABLET | Freq: Three times a day (TID) | ORAL | 0 refills | Status: DC | PRN

## 2018-01-25 NOTE — Telephone Encounter (Signed)
I have called [facility] short stay to get this taken care of. I was not able to reach anyone. I have left a message to give [name] a call back. I will need a little help getting this set up as far as the infusions. I have placed STAT order for the CBC, CMP, and UA to be done prior to starting the infusions. I have placed prescription order for the 12m tablets of Zofran to be taken q8h prn for nausea and vomiting. I called and spoke with KMaudie Mercurywho is a nurse with the short stay, [facility] and they cannot schedule or get anything together until she is there to start this. I was advised that KMaudie Mercurywould place this patients name on Dee's desk to have this as a priority when she comes in on 12.5.19. DKarena Addisonwill need to be contacted back tomorrow to have all of this taken care of. I have ordered labs and Zofran for the patient. You can contact [place]sterfieldat [phone]. I am forwarding back to MR and ERaquel Sarnato have this followed up on 12.5.19.

## 2018-01-25 NOTE — Telephone Encounter (Signed)
Routing back to [place] 01/25/2018 because she is back. TO be addressed 01/25/2018 ASAP

## 2018-01-26 ENCOUNTER — Ambulatory Visit (HOSPITAL_COMMUNITY)
Admission: RE | Admit: 2018-01-26 | Discharge: 2018-01-26 | Disposition: A | Discharge status: Home / Self Care | Payer: BLUE CROSS/BLUE SHIELD | Source: Ambulatory Visit | Attending: Internal Medicine | Admitting: Internal Medicine

## 2018-01-26 ENCOUNTER — Encounter (HOSPITAL_COMMUNITY): Payer: Self-pay

## 2018-01-26 DIAGNOSIS — Z7901 Long term (current) use of anticoagulants: Secondary | ICD-10-CM | POA: Diagnosis not present

## 2018-01-26 DIAGNOSIS — G4730 Sleep apnea, unspecified: Secondary | ICD-10-CM | POA: Insufficient documentation

## 2018-01-26 DIAGNOSIS — Z7984 Long term (current) use of oral hypoglycemic drugs: Secondary | ICD-10-CM | POA: Diagnosis not present

## 2018-01-26 DIAGNOSIS — J84112 Idiopathic pulmonary fibrosis: Secondary | ICD-10-CM | POA: Diagnosis not present

## 2018-01-26 DIAGNOSIS — I729 Aneurysm of unspecified site: Secondary | ICD-10-CM | POA: Diagnosis not present

## 2018-01-26 DIAGNOSIS — Z8249 Family history of ischemic heart disease and other diseases of the circulatory system: Secondary | ICD-10-CM | POA: Diagnosis not present

## 2018-01-26 DIAGNOSIS — Z9981 Dependence on supplemental oxygen: Secondary | ICD-10-CM | POA: Insufficient documentation

## 2018-01-26 DIAGNOSIS — J9611 Chronic respiratory failure with hypoxia: Secondary | ICD-10-CM | POA: Diagnosis not present

## 2018-01-26 DIAGNOSIS — E079 Disorder of thyroid, unspecified: Secondary | ICD-10-CM | POA: Diagnosis not present

## 2018-01-26 DIAGNOSIS — Z881 Allergy status to other antibiotic agents status: Secondary | ICD-10-CM | POA: Diagnosis not present

## 2018-01-26 DIAGNOSIS — Z79899 Other long term (current) drug therapy: Secondary | ICD-10-CM | POA: Diagnosis not present

## 2018-01-26 DIAGNOSIS — E119 Type 2 diabetes mellitus without complications: Secondary | ICD-10-CM | POA: Insufficient documentation

## 2018-01-26 HISTORY — PX: IR IMAGING GUIDED PORT INSERTION: IMG5740

## 2018-01-26 HISTORY — DX: Pulmonary fibrosis, unspecified: J84.10

## 2018-01-26 LAB — BASIC METABOLIC PANEL
Anion gap: 15 (ref 5–15)
BUN: 13 mg/dL (ref 6–20)
CALCIUM: 9.8 mg/dL (ref 8.9–10.3)
CO2: 30 mmol/L (ref 22–32)
Chloride: 93 mmol/L — ABNORMAL LOW (ref 98–111)
Creatinine, Ser: 0.7 mg/dL (ref 0.44–1.00)
GFR calc Af Amer: >60 mL/min (ref >60)
GFR calc non Af Amer: >60 mL/min (ref >60)
Glucose, Bld: 118 mg/dL — ABNORMAL HIGH (ref 70–99)
Potassium: 3.8 mmol/L (ref 3.5–5.1)
Sodium: 138 mmol/L (ref 135–145)

## 2018-01-26 LAB — CBC
HCT: 37.4 % (ref 36.0–46.0)
Hemoglobin: 10.7 g/dL — ABNORMAL LOW (ref 12.0–15.0)
MCH: 23.7 pg — ABNORMAL LOW (ref 26.0–34.0)
MCHC: 28.6 g/dL — AB (ref 30.0–36.0)
MCV: 82.9 fL (ref 80.0–100.0)
PLATELETS: 387 10*3/uL (ref 150–400)
RBC: 4.51 MIL/uL (ref 3.87–5.11)
RDW: 14.9 % (ref 11.5–15.5)
WBC: 9.4 10*3/uL (ref 4.0–10.5)
nRBC: 0 % (ref 0.0–0.2)

## 2018-01-26 LAB — PROTIME-INR
INR: 1.18
Prothrombin Time: 14.9 seconds (ref 11.4–15.2)

## 2018-01-26 LAB — GLUCOSE, CAPILLARY: Glucose-Capillary: 115 mg/dL — ABNORMAL HIGH (ref 70–99)

## 2018-01-26 MED ORDER — LIDOCAINE-EPINEPHRINE (PF) 1 %-1:200000 IJ SOLN
INTRAMUSCULAR | Status: AC
  Filled 2018-01-26: qty 30

## 2018-01-26 MED ORDER — CEFAZOLIN SODIUM-DEXTROSE 2-4 GM/100ML-% IV SOLN
2.0000 g | INTRAVENOUS | Status: AC
  Administered 2018-01-26: 2 g via INTRAVENOUS

## 2018-01-26 MED ORDER — ACETAMINOPHEN 325 MG PO TABS
650.0000 mg | ORAL_TABLET | Freq: Once | ORAL | Status: AC
  Administered 2018-01-26: 650 mg via ORAL
  Filled 2018-01-26 (×3): qty 2

## 2018-01-26 MED ORDER — CEFAZOLIN SODIUM-DEXTROSE 2-4 GM/100ML-% IV SOLN
INTRAVENOUS | Status: AC
  Filled 2018-01-26: qty 100

## 2018-01-26 MED ORDER — HEPARIN SOD (PORK) LOCK FLUSH 100 UNIT/ML IV SOLN
INTRAVENOUS | Status: AC
  Filled 2018-01-26: qty 5

## 2018-01-26 MED ORDER — SODIUM CHLORIDE 0.9 % IV SOLN: INTRAVENOUS | Status: DC

## 2018-01-26 MED ORDER — MIDAZOLAM HCL 2 MG/2ML IJ SOLN
INTRAMUSCULAR | Status: AC | PRN
  Administered 2018-01-26: 0.5 mg via INTRAVENOUS
  Administered 2018-01-26: 1 mg via INTRAVENOUS

## 2018-01-26 MED ORDER — FENTANYL CITRATE (PF) 100 MCG/2ML IJ SOLN
INTRAMUSCULAR | Status: AC
  Filled 2018-01-26: qty 2

## 2018-01-26 MED ORDER — LORAZEPAM 2 MG/ML IJ SOLN
INTRAMUSCULAR | Status: AC
  Filled 2018-01-26: qty 1

## 2018-01-26 MED ORDER — FENTANYL CITRATE (PF) 100 MCG/2ML IJ SOLN
INTRAMUSCULAR | Status: AC | PRN
  Administered 2018-01-26: 50 ug via INTRAVENOUS
  Administered 2018-01-26: 25 ug via INTRAVENOUS

## 2018-01-26 MED ORDER — MIDAZOLAM HCL 2 MG/2ML IJ SOLN
INTRAMUSCULAR | Status: AC
  Filled 2018-01-26: qty 2

## 2018-01-26 NOTE — Consult Note (Signed)
Chief Complaint: Patient was seen in consultation today for port insertion  Referring Physician(s): Ramaswamy,Murali  Supervising Physician: Arne Cleveland  Patient Status: [facility]  History of Present Illness: Tasha Ballard is a 47 y.o. female with a past medical history of DM, sleep apnea and idiopathy pulmonary fibrosis currently followed by Dr. [name] who requests port placement so that patient may begin IV cytoxan. Patient has been taking PO cytoxan since 01/10/18 however she is attempting to undergo a lung transplant and as such she will require IV cytoxan.   Patient reports she has been feeling well, dyspnea is at baseline - she wears oxygen continuously while at home, usually 8-10 L while at rest and 10-15 L with exertion. She denies any other complaints today.   Past Medical History:  Diagnosis Date  . Aneurysm ([place])   . Diabetes mellitus without complication ([place])   . Pulmonary fibrosis ([place])   . Thyroid disease     History reviewed. No pertinent surgical history.  Allergies: Macrobid [nitrofurantoin macrocrystal]  Medications: Prior to Admission medications   Medication Sig Start Date End Date Taking? Authorizing Provider  benzonatate (TESSALON) 100 MG capsule Take 100 mg by mouth 3 (three) times daily as needed for cough.    Yes [provider]  carvedilol (COREG) 6.25 MG tablet Take 6.25 mg by mouth 2 (two) times daily with a meal.   Yes [provider]  cholecalciferol (VITAMIN D) 25 MCG (1000 UT) tablet Take 1,000 Units by mouth daily.   Yes [provider]  cyclophosphamide (CYTOXAN) 25 MG capsule Take 5 capsules (125 mg total) by mouth daily. Give on an empty stomach 1 hour before or 2 hours after meals. 01/10/18  Yes Brand Males, MD  furosemide (LASIX) 40 MG tablet Take 40 mg by mouth See admin instructions. Take 1 tablet (40 mg) daily, may repeat dose if needed for fluid retention.   Yes [provider]    metFORMIN (GLUCOPHAGE) 500 MG tablet Take 500 mg by mouth 2 (two) times daily.    Yes [provider]  montelukast (SINGULAIR) 10 MG tablet Take 10 mg by mouth daily after supper.   Yes [provider]  predniSONE (DELTASONE) 10 MG tablet Take 10-49m as needed for SOB Patient taking differently: Take 10 mg by mouth daily.  11/22/17  Yes RBrand Males MD  PROAIR HFA 108 [phone]) MCG/ACT inhaler Inhale 2 puffs into the lungs every 6 (six) hours as needed for wheezing or shortness of breath. 01/10/18  Yes [provider]  spironolactone (ALDACTONE) 100 MG tablet Take 100 mg by mouth daily.   Yes [provider]  sulfamethoxazole-trimethoprim (BACTRIM) 400-80 MG tablet Once a day while on prednisone Patient taking differently: Take 1 tablet by mouth every evening.  11/22/17  Yes RBrand Males MD  thyroid (ARMOUR) 90 MG tablet Take 90 mg by mouth daily before breakfast.    Yes [provider]  apixaban (ELIQUIS) 5 MG TABS tablet Take 5 mg by mouth 2 (two) times daily.    [provider]  ondansetron (ZOFRAN) 8 MG tablet Take 1 tablet (8 mg total) by mouth every 8 (eight) hours as needed for nausea or vomiting. 01/25/18   RBrand Males MD     Family History  Problem Relation Age of Onset  . Heart failure Mother   . Rheumatologic disease Father     Social History   Socioeconomic History  . Marital status: Married    Spouse  name: Not on file  . Number of children: Not on file  . Years of education: Not on file  . Highest education level: Not on file  Occupational History  . Not on file  Social Needs  . Financial resource strain: Not on file  . Food insecurity:    Worry: Not on file    Inability: Not on file  . Transportation needs:    Medical: Not on file    Non-medical: Not on file  Tobacco Use  . Smoking status: Never Smoker  . Smokeless tobacco: Never Used  Substance and Sexual Activity  . Alcohol use: Not  Currently  . Drug use: Never  . Sexual activity: Not on file  Lifestyle  . Physical activity:    Days per week: Not on file    Minutes per session: Not on file  . Stress: Not on file  Relationships  . Social connections:    Talks on phone: Not on file    Gets together: Not on file    Attends religious service: Not on file    Active member of club or organization: Not on file    Attends meetings of clubs or organizations: Not on file    Relationship status: Not on file  Other Topics Concern  . Not on file  Social History Narrative  . Not on file     Review of Systems: A 12 point ROS discussed and pertinent positives are indicated in the HPI above.  All other systems are negative.  Review of Systems  Constitutional: Negative for chills, fatigue, fever and unexpected weight change.  Respiratory: Positive for shortness of breath (baseline). Negative for cough.   Cardiovascular: Negative for chest pain.  Gastrointestinal: Negative for abdominal pain, constipation, diarrhea, nausea and vomiting.  Genitourinary: Negative for dysuria.  Skin: Negative for rash.  Neurological: Negative for dizziness and syncope.  Psychiatric/Behavioral: Negative for confusion.    Vital Signs: BP 126/90   Pulse 86   Temp (!) 97.5 F (36.4 C) (Oral)   Resp 18   Ht _0  (1.575 m)   Wt 216 lb (98 kg)   SpO2 100%   BMI 39.51 kg/m   Physical Exam  Constitutional: She is oriented to person, place, and time. No distress.  HENT:  Head: Normocephalic.  Cardiovascular: Normal rate, regular rhythm and normal heart sounds.  Pulmonary/Chest: Effort normal. She has rales (bilaterally).  O2 via [place]  Abdominal: Soft. She exhibits no distension. There is no tenderness.  Neurological: She is alert and oriented to person, place, and time.  Skin: Skin is warm and dry. She is not diaphoretic.  Psychiatric: She has a normal mood and affect. Her behavior is normal. Judgment and thought content normal.    Vitals reviewed.    MD Evaluation Airway: WNL(Continuous O2 via [place] at home; 8-10 L at rest, 10 -15L with exertion) Heart: WNL Abdomen: WNL Chest/ Lungs: WNL ASA  Classification: 3 Mallampati/Airway Score: One   Imaging: No results found.  Labs:  CBC: Recent Labs    12/30/17 1153 01/06/18 1619 01/17/18 1039 01/26/18 0715  WBC 15.8* 11.5* 13.6* 9.4  HGB 10.9* 10.8* 10.8* 10.7*  HCT 34.8* 35.3* 34.9* 37.4  PLT 374.0 385.0 387.0 387    COAGS: Recent Labs    01/26/18 0715  INR 1.18    BMP: Recent Labs    12/30/17 1153 01/06/18 1619 01/17/18 1039 01/26/18 0715  NA 137 135 136 138  K 3.9 4.3 3.9 3.8  CL  92* 92* 92* 93*  CO2 36* 34* 34* 30  GLUCOSE 145* 144* 144* 118*  BUN _0 CALCIUM 10.1 9.9 10.2 9.8  CREATININE 0.76 0.71 0.71 0.70  GFRNONAA  --   --   --  >60  GFRAA  --   --   --  >60    LIVER FUNCTION TESTS: Recent Labs    12/22/17 1047 12/30/17 1153 01/06/18 1619 01/17/18 1039  BILITOT 0.3 0.4  0.4 0.3  0.3 0.4  AST _1 ALT _2 ALKPHOS 54 59  59 64  64 61  PROT 6.7 7.5  7.5 7.1  7.1 7.2  ALBUMIN 4.2 4.4  4.4 4.1  4.1 4.2    TUMOR MARKERS: No results for input(s): AFPTM, CEA, CA199, CHROMGRNA in the last 8760 hours.  Assessment and Plan:  Patient with history of IPF with large oxygen requirement followed by Dr. [name] who presents today for port insertion so that she may transition from PO cytoxan to IV cytoxan in hopes of undergoing lung transplant.  Patient has been NPO since last night, she does not take blood thinning medications. Afebrile, WBC 9.4, hgb 10.7, INR 1.18.  Risks and benefits of image guided port-a-catheter placement was discussed with the patient including, but not limited to bleeding, infection, pneumothorax, or fibrin sheath development and need for additional procedures.  All of the patient's questions were answered, patient is agreeable to  proceed.  Consent signed and in chart.  Thank you for this interesting consult.  I greatly enjoyed meeting Tasha Ballard and look forward to participating in their care.  A copy of this report was sent to the requesting provider on this date.  Electronically Signed: Joaquim Nam, PA-C 01/26/2018, 7:52 AM   I spent a total of  30 Minutes in face to face in clinical consultation, greater than 50% of which was counseling/coordinating care for port placement.

## 2018-01-26 NOTE — Telephone Encounter (Signed)
Called Short Stay to speak to [facility] Spoke with Maudie Mercury in regards to pt. Per Christy Gentles came to office this morning and gathered her paperwork that was needing to be looked at by her and she has now left office as of 3pm. Maudie Mercury stated the best time to call to speak with Karena Addison is between 8-12.  Stated to Maudie Mercury that pt did get a port placed today, 01/26/18. Maudie Mercury stated the original message that she left for [facility] from yesterday, 01/25/18 but she stated she was going to leave another note for [facility] to see tomorrow in regards to looking at the information in pt's epic chart with what is needing to be done.  Kim told me to call Karena Addison back tomorrow at either [phone] or at [phone] between 8-12 to see if an appt has been able to be scheduled for pt to begin to receive the cytoxan and also if there were any other orders that [facility] was needing [name] to fax to her for pt. Will call first thing tomorrow to get this taken care of.

## 2018-01-26 NOTE — Procedures (Signed)
  Procedure: R IJ Port catheter   EBL:   minimal Complications:  none immediate  See full dictation in [employer].  Dillard Cannon MD Main # [phone] Pager  [phone]

## 2018-01-26 NOTE — Discharge Instructions (Addendum)
Implanted Port Insertion, Care After This sheet gives you information about how to care for yourself after your procedure. Your health care provider may also give you more specific instructions. If you have problems or questions, contact your health care provider. What can I expect after the procedure? After your procedure, it is common to have:  Discomfort at the port insertion site.  Bruising on the skin over the port. This should improve over 3-4 days.  Follow these instructions at home: [facility] care  After your port is placed, you will get a manufacturer's information card. The card has information about your port. Keep this card with you at all times.  Take care of the port as told by your health care provider. Ask your health care provider if you or a family member can get training for taking care of the port at home. A home health care nurse may also take care of the port.  Make sure to remember what type of port you have. Incision care  Follow instructions from your health care provider about how to take care of your port insertion site. Make sure you: ? Wash your hands with soap and water before you change your bandage (dressing). If soap and water are not available, use hand sanitizer. ? Change your dressing as told by your health care provider. ? Leave stitches (sutures), skin glue, or adhesive strips in place. These skin closures may need to stay in place for 2 weeks or longer. If adhesive strip edges start to loosen and curl up, you may trim the loose edges. Do not remove adhesive strips completely unless your health care provider tells you to do that.  Check your port insertion site every day for signs of infection. Check for: ? More redness, swelling, or pain. ? More fluid or blood. ? Warmth. ? Pus or a bad smell. General instructions  Do not take baths, swim, or use a hot tub until your health care provider approves.  Do not lift anything that is heavier than 10 lb (4.5  kg) for a week, or as told by your health care provider.  Ask your health care provider when it is okay to: ? Return to work or school. ? Resume usual physical activities or sports.  Do not drive for 24 hours if you were given a medicine to help you relax (sedative).  Take over-the-counter and prescription medicines only as told by your health care provider.  Wear a medical alert bracelet in case of an emergency. This will tell any health care providers that you have a port.  Keep all follow-up visits as told by your health care provider. This is important. Contact a health care provider if:  You cannot flush your port with saline as directed, or you cannot draw blood from the port.  You have a fever or chills.  You have more redness, swelling, or pain around your port insertion site.  You have more fluid or blood coming from your port insertion site.  Your port insertion site feels warm to the touch.  You have pus or a bad smell coming from the port insertion site. Get help right away if:  You have chest pain or shortness of breath.  You have bleeding from your port that you cannot control. Summary  Take care of the port as told by your health care provider.  Change your dressing as told by your health care provider.  Keep all follow-up visits as told by your health care provider.  This information is not intended to replace advice given to you by your health care provider. Make sure you discuss any questions you have with your health care provider. Document Released: 11/29/2012 Document Revised: 12/31/2015 Document Reviewed: 12/31/2015 Elsevier Interactive Patient Education  2017 [place]. Moderate Conscious Sedation, Adult, Care After These instructions provide you with information about caring for yourself after your procedure. Your health care provider may also give you more specific instructions. Your treatment has been planned according to current medical  practices, but problems sometimes occur. Call your health care provider if you have any problems or questions after your procedure. What can I expect after the procedure? After your procedure, it is common:  To feel sleepy for several hours.  To feel clumsy and have poor balance for several hours.  To have poor judgment for several hours.  To vomit if you eat too soon.  Follow these instructions at home: For at least 24 hours after the procedure:   Do not: ? Participate in activities where you could fall or become injured. ? Drive. ? Use heavy machinery. ? Drink alcohol. ? Take sleeping pills or medicines that cause drowsiness. ? Make important decisions or sign legal documents. ? Take care of children on your own.  Rest. Eating and drinking  Follow the diet recommended by your health care provider.  If you vomit: ? Drink water, juice, or soup when you can drink without vomiting. ? Make sure you have little or no nausea before eating solid foods. General instructions  Have a responsible adult stay with you until you are awake and alert.  Take over-the-counter and prescription medicines only as told by your health care provider.  If you smoke, do not smoke without supervision.  Keep all follow-up visits as told by your health care provider. This is important. Contact a health care provider if:  You keep feeling nauseous or you keep vomiting.  You feel light-headed.  You develop a rash.  You have a fever. Get help right away if:  You have trouble breathing. This information is not intended to replace advice given to you by your health care provider. Make sure you discuss any questions you have with your health care provider. Document Released: 11/29/2012 Document Revised: 07/14/2015 Document Reviewed: 05/31/2015 Elsevier Interactive Patient Education  [employer].

## 2018-01-27 NOTE — Telephone Encounter (Signed)
I have filled out the order form and have also printed out all the instructions stated by MR so that way I can fax all of that with the order sheet since it is a lot of information.  I have placed it for MR to sign. Once signed by him, I will fax it to [facility] and then call her once I receive a confirmation.

## 2018-01-27 NOTE — Telephone Encounter (Signed)
Called and spoke with [place]. While speaking with [facility], I read through all the instructions that was written by MR about the Cytoxan protocol that he has stated and asked her if she could just take this how MR has written it out or if we had to actually fax an order form to them.  Per [place], they would have to have the order sheet filled out exactly how MR was wanting it to be done. Karena Addison is going to fax the order form to me and stated that I call her as soon as I have faxed it back to ensure they did receive it. Once they receive the order form, they will see if they can expedite getting pt started on the IV cytoxan.

## 2018-01-30 ENCOUNTER — Other Ambulatory Visit (INDEPENDENT_AMBULATORY_CARE_PROVIDER_SITE_OTHER): Payer: BLUE CROSS/BLUE SHIELD

## 2018-01-30 DIAGNOSIS — Z5181 Encounter for therapeutic drug level monitoring: Secondary | ICD-10-CM

## 2018-01-30 DIAGNOSIS — J84112 Idiopathic pulmonary fibrosis: Secondary | ICD-10-CM

## 2018-01-30 LAB — CBC
HCT: 37.6 % (ref 36.0–46.0)
HEMOGLOBIN: 11.7 g/dL — AB (ref 12.0–15.0)
MCHC: 31 g/dL (ref 30.0–36.0)
MCV: 79.3 fl (ref 78.0–100.0)
Platelets: 296 10*3/uL (ref 150.0–400.0)
RBC: 4.74 Mil/uL (ref 3.87–5.11)
RDW: 15.8 % — ABNORMAL HIGH (ref 11.5–15.5)
WBC: 7.7 10*3/uL (ref 4.0–10.5)

## 2018-01-30 LAB — BASIC METABOLIC PANEL
BUN: 12 mg/dL (ref 6–23)
CO2: 35 mEq/L — ABNORMAL HIGH (ref 19–32)
Calcium: 10.3 mg/dL (ref 8.4–10.5)
Chloride: 95 mEq/L — ABNORMAL LOW (ref 96–112)
Creatinine, Ser: 0.59 mg/dL (ref 0.40–1.20)
GFR: 114.94 mL/min (ref >60.00)
Glucose, Bld: 104 mg/dL — ABNORMAL HIGH (ref 70–99)
Potassium: 4.1 mEq/L (ref 3.5–5.1)
Sodium: 139 mEq/L (ref 135–145)

## 2018-01-30 LAB — URINALYSIS
Bilirubin Urine: NEGATIVE
Hgb urine dipstick: NEGATIVE
Ketones, ur: NEGATIVE
Leukocytes, UA: NEGATIVE
Nitrite: NEGATIVE
Specific Gravity, Urine: <=1.005 — AB (ref 1.000–1.030)
Total Protein, Urine: NEGATIVE
Urine Glucose: NEGATIVE
Urobilinogen, UA: 0.2 (ref 0.0–1.0)
pH: 7 (ref 5.0–8.0)

## 2018-01-30 LAB — HEPATIC FUNCTION PANEL
ALBUMIN: 4.1 g/dL (ref 3.5–5.2)
ALK PHOS: 74 U/L (ref 39–117)
ALT: 13 U/L (ref 0–35)
AST: 15 U/L (ref 0–37)
Bilirubin, Direct: 0 mg/dL (ref 0.0–0.3)
Total Bilirubin: 0.4 mg/dL (ref 0.2–1.2)
Total Protein: 7 g/dL (ref 6.0–8.3)

## 2018-01-30 NOTE — Telephone Encounter (Signed)
The physician's orders sheet has been signed by MR. Called Short Stay and spoke with [facility] to obtain fax number. I have faxed the physician's orders to [facility] at Short Stay in her attention.  Will leave encounter open until I know that pt has been able to start on IV cytoxan.

## 2018-02-01 ENCOUNTER — Ambulatory Visit: Payer: BLUE CROSS/BLUE SHIELD | Admitting: Skilled Nursing Facility1

## 2018-02-01 ENCOUNTER — Telehealth: Payer: Self-pay | Admitting: Internal Medicine

## 2018-02-01 NOTE — Telephone Encounter (Signed)
Called pt back. Stated to her that we had tried to call short stay to speak with [facility] but she was currently not available. I provided pt with two numbers so she can see if she could call them to see where she stood in regards to being started on the IV Cytoxan.  I stated to her that everything was sent to them 01/30/18 after the order form had been signed by MR. Per pt, she stated to me if she was able to get anywhere with short stay when she called or if anything else needed to be done on our end, she would call our office back. I also stated to her when I called earlier, a message was left for [facility] to call our office back.

## 2018-02-01 NOTE — Telephone Encounter (Signed)
Patient returned call, CB is [phone]

## 2018-02-01 NOTE — Telephone Encounter (Signed)
LMTCB for Sherri at [employer]

## 2018-02-01 NOTE — Telephone Encounter (Signed)
Called patient unable to reach left message to give [name] a call back.

## 2018-02-01 NOTE — Telephone Encounter (Signed)
Attempted to call Short Stay to speak with [facility]. Spoke with Cyril Mourning who stated that [place] had stepped away. I stated to her the reason for my call and she stated she would have [place] call us back so we can try to see where pt stands on getting pt started on IV cytoxan.  Attempted to call pt to relay information to her but when I called her, signal was not good and pt asked to be called back on her home number. The number that we have listed for pt's home number is her mobile number. I have sent pt a mychart message asking her to call the office so we can discuss info with her.

## 2018-02-01 NOTE — Telephone Encounter (Signed)
She Tasha Ballard emailed me. Wants to know date of IV cytoxan. Wants to get it done this week ideally or maybe next. Please update her to date of infusion.   She also wanted number for infusion center - please give her  She wanted to know lab results - all normal though wbc is in downtrend though still normal. DO NOT RECOMMEND increaseing oral cytoxan dose. Want to first know IV infusion date  Results for Couillard, [name]" (MRN [id number]) as of 02/01/2018 12:42  Ref. Range 01/06/2018 16:19 01/06/2018 16:19 01/17/2018 10:39 01/26/2018 07:15 01/30/2018 11:33  WBC Latest Ref Range: 4.0 - 10.5 K/uL 11.5 (H)  13.6 (H) 9.4 7.7

## 2018-02-01 NOTE — Telephone Encounter (Signed)
Patient calling requesting to speak to Horton Marshall is [phone]

## 2018-02-02 NOTE — Telephone Encounter (Signed)
Called Short Stay and spoke with [facility] to see where we stood in regards for pt to receive IV cytoxan. Per Karena Addison, pt was scheduled to receive her 1st dose 12/19 but then the appt was cancelled by them. Karena Addison stated to me that this has been taken out of her hands and is now in the hands of the director.  Spoke with Bridget Hartshorn who is the specialty coordinator for infusion. Per Santiago Glad, after herself, the director, and Surveyor, quantity did a further review of pt's medical history they determined that pt is too sick to be able to have the IV cytoxan done in an outpatient environment. If we still wanted pt to receive the IV cytoxan, pt would have to be admitted and have this done inpatient with observation due to short stay being a one on one with the infusions.   Called pt to let her know this information. Pt is upset and frustrated due to receiving a port for the IV cytoxan and now finding out this information. Pt stated to me she received Rituxan before which was done at short stay at [facility].  Pt stated to me she was going to call [facility] to see if the IV cytoxan could be done there.  MR, please advise on this for pt. Thanks!

## 2018-02-02 NOTE — Telephone Encounter (Signed)
I think is bestt that patient come to our office 02/03/18 - 9am to see TP (put patient in TP schedule) and we (TP under my supervision)_ do the pre-admit eval and orders in offie. From there patient goes to [facility] for bed placement and then starting infusion. Otherwise, Dr Lake Bells will have to do it and if ICU is busy that can get complicated  Admitting can call me on my cell if they cannot get hold of patient

## 2018-02-02 NOTE — Telephone Encounter (Signed)
Back and forth email from the patient - final resolution for Tasha Ballard   - try to get cytoxan IV done as inpatient  At [facility] long - you need to get med-surg bed for tomorrow as obs status. After you do that, please let me know. I will then decide the workflow which might be that patient comes in the morning 02/03/18 to see me/app in clinic v diretctly go to [facility]    Dr. Brand Males, M.D., F.C.C.P,  Pulmonary and Critical Care Medicine Staff Physician, [facility] Director - Interstitial Lung Disease  Program  Pulmonary [place] at [place], [place], [address]  Pager: [phone], If no answer or between  15:00h - 7:00h: call [phone] Telephone: [phone]  2:41 PM 02/02/2018

## 2018-02-02 NOTE — Telephone Encounter (Signed)
Attempted to call bed placement with [facility] but unable to speak to anyone. I have left a message for someone from bedplacement to call me back so I can get this taken care of for pt.

## 2018-02-02 NOTE — Telephone Encounter (Signed)
Dan with bed placement returned my call. I gave Linna Hoff the information needed in regards to pt being admitted and stated to him it was for observation tomorrow, 12/13 so she can receive IV cytoxan.  Per Linna Hoff, he stated that they will work on getting pt a bed. I gave him the number for me and also Linna Hoff wanted pt's phone number just in case our office was not open, he would call pt directly to let her know what bed and when/where she needed to go.  Called and spoke with pt letting her know this information. While speaking with pt, pt stated she had received an email from MR stating for her to come to office prior to going to hospital. MR, please advise on this if you do want pt to still come by the office as the number I gave the hospital to call her was her home number due to pt stating if she was at her home, her mobile number does not have good reception and also since weather is supposed to be bad tomorrow, do you still want her to come by the office or just stay at home until she receives a phone call from [place] in regards to a bed for her?

## 2018-02-02 NOTE — Telephone Encounter (Signed)
Spoke with Tasha Ballard she states the matter has already been handled nothing further needed at this time.

## 2018-02-02 NOTE — Telephone Encounter (Signed)
Another mychart message from pt in regards to calling aunt who volunteers over at short stay at [facility]: I called and she didn't think they would allow them to do chemo but she suggested i call the cancer center. I also thought after we got off the phone. What if we puts me in the hospital Monday morning and I get it and then if I do fine maybe they will do after that? Thoughts?

## 2018-02-02 NOTE — Telephone Encounter (Signed)
MR, please review another message from pt in regards to IV cytoxan. Thanks!  I spoke with [facility]. They do for people on oxygen all the time. They do for people other than cancer patients. You just have to be referred by a [facility] Doctor. My primary is a [facility] Dr so if you can send the priscription with insturctions to Dr Gayla Doss I will all him and ask hm to end to [facility]. [place] -the office manager at the cancer center said if you have any questions to please call her [phone] and The Cancer center head it Dr Wendee Beavers [phone]. Thanks, [employer]

## 2018-02-02 NOTE — Telephone Encounter (Signed)
Called bed placement and spoke with Hilliard Clark stating to him if he cannot reach anyone at our office or pt in regards to a bed for pt to call him on his cell. Sean expressed understanding. I gave Hilliard Clark MR's mobile number.  Called pt to let her know this and also stated to her that MR wanted her to come tomorrow to see TP at 9am prior to admission. Pt expressed understanding. Pt asked me to give bed placement her mobile number as well as home number in case they could not reach her at home.   I called bed placement back and gave them pt's mobile number so that way they have my number I have been sitting at, pt's mobile and home number, and also MR's mobile number. Pt has also been scheduled tomorrow for pt to see TP at 9am.  From office, pt will then go to be admitted to hospital for observation so she can receive IV cytoxan. Nothing further needed.

## 2018-02-03 ENCOUNTER — Telehealth: Payer: Self-pay | Admitting: Internal Medicine

## 2018-02-03 ENCOUNTER — Other Ambulatory Visit: Payer: Self-pay

## 2018-02-03 ENCOUNTER — Ambulatory Visit (INDEPENDENT_AMBULATORY_CARE_PROVIDER_SITE_OTHER): Payer: BLUE CROSS/BLUE SHIELD | Admitting: Adult Health

## 2018-02-03 ENCOUNTER — Observation Stay (HOSPITAL_COMMUNITY)
Admission: RE | Admit: 2018-02-03 | Discharge: 2018-02-03 | Disposition: A | Discharge status: Home / Self Care | Payer: BLUE CROSS/BLUE SHIELD | Source: Ambulatory Visit | Attending: Internal Medicine | Admitting: Internal Medicine

## 2018-02-03 ENCOUNTER — Encounter (HOSPITAL_COMMUNITY): Payer: Self-pay

## 2018-02-03 ENCOUNTER — Encounter: Payer: Self-pay | Admitting: Adult Health

## 2018-02-03 DIAGNOSIS — J849 Interstitial pulmonary disease, unspecified: Secondary | ICD-10-CM | POA: Diagnosis present

## 2018-02-03 DIAGNOSIS — Z79899 Other long term (current) drug therapy: Secondary | ICD-10-CM | POA: Diagnosis not present

## 2018-02-03 DIAGNOSIS — E119 Type 2 diabetes mellitus without complications: Secondary | ICD-10-CM | POA: Insufficient documentation

## 2018-02-03 DIAGNOSIS — Z7984 Long term (current) use of oral hypoglycemic drugs: Secondary | ICD-10-CM | POA: Diagnosis not present

## 2018-02-03 DIAGNOSIS — J84113 Idiopathic non-specific interstitial pneumonitis: Secondary | ICD-10-CM | POA: Diagnosis not present

## 2018-02-03 DIAGNOSIS — J9611 Chronic respiratory failure with hypoxia: Secondary | ICD-10-CM | POA: Insufficient documentation

## 2018-02-03 DIAGNOSIS — I509 Heart failure, unspecified: Secondary | ICD-10-CM | POA: Insufficient documentation

## 2018-02-03 DIAGNOSIS — R05 Cough: Secondary | ICD-10-CM | POA: Diagnosis present

## 2018-02-03 LAB — CBC WITH DIFFERENTIAL/PLATELET
Abs Immature Granulocytes: 0.05 10*3/uL (ref 0.00–0.07)
Basophils Absolute: 0.1 10*3/uL (ref 0.0–0.1)
Basophils Relative: 1 %
Eosinophils Absolute: 0.1 10*3/uL (ref 0.0–0.5)
Eosinophils Relative: 1 %
HCT: 35.9 % — ABNORMAL LOW (ref 36.0–46.0)
Hemoglobin: 10.5 g/dL — ABNORMAL LOW (ref 12.0–15.0)
Immature Granulocytes: 1 %
Lymphocytes Relative: 5 %
Lymphs Abs: 0.4 10*3/uL — ABNORMAL LOW (ref 0.7–4.0)
MCH: 24.9 pg — ABNORMAL LOW (ref 26.0–34.0)
MCHC: 29.2 g/dL — AB (ref 30.0–36.0)
MCV: 85.3 fL (ref 80.0–100.0)
MONO ABS: 0.4 10*3/uL (ref 0.1–1.0)
MONOS PCT: 5 %
Neutro Abs: 6.9 10*3/uL (ref 1.7–7.7)
Neutrophils Relative %: 87 %
Platelets: 248 10*3/uL (ref 150–400)
RBC: 4.21 MIL/uL (ref 3.87–5.11)
RDW: 15.4 % (ref 11.5–15.5)
WBC: 7.9 10*3/uL (ref 4.0–10.5)
nRBC: 0 % (ref 0.0–0.2)

## 2018-02-03 LAB — COMPREHENSIVE METABOLIC PANEL
ALT: 23 U/L (ref 0–44)
AST: 28 U/L (ref 15–41)
Albumin: 3.4 g/dL — ABNORMAL LOW (ref 3.5–5.0)
Alkaline Phosphatase: 64 U/L (ref 38–126)
Anion gap: 15 (ref 5–15)
BUN: 14 mg/dL (ref 6–20)
CO2: 27 mmol/L (ref 22–32)
Calcium: 8.5 mg/dL — ABNORMAL LOW (ref 8.9–10.3)
Chloride: 95 mmol/L — ABNORMAL LOW (ref 98–111)
Creatinine, Ser: 0.77 mg/dL (ref 0.44–1.00)
GFR calc Af Amer: >60 mL/min (ref >60)
Glucose, Bld: 219 mg/dL — ABNORMAL HIGH (ref 70–99)
Potassium: 3.7 mmol/L (ref 3.5–5.1)
Sodium: 137 mmol/L (ref 135–145)
Total Bilirubin: 0.6 mg/dL (ref 0.3–1.2)
Total Protein: 6 g/dL — ABNORMAL LOW (ref 6.5–8.1)

## 2018-02-03 LAB — URINALYSIS, ROUTINE W REFLEX MICROSCOPIC
Bilirubin Urine: NEGATIVE
Glucose, UA: NEGATIVE mg/dL
Hgb urine dipstick: NEGATIVE
KETONES UR: NEGATIVE mg/dL
Leukocytes, UA: NEGATIVE
Nitrite: NEGATIVE
Protein, ur: NEGATIVE mg/dL
Specific Gravity, Urine: 1.008 (ref 1.005–1.030)
pH: 7 (ref 5.0–8.0)

## 2018-02-03 MED ORDER — HEPARIN SOD (PORK) LOCK FLUSH 100 UNIT/ML IV SOLN
500.0000 [IU] | INTRAVENOUS | Status: DC | PRN
  Filled 2018-02-03: qty 5

## 2018-02-03 MED ORDER — ONDANSETRON HCL 8 MG PO TABS
8.0000 mg | ORAL_TABLET | ORAL | Status: DC
  Administered 2018-02-03 (×2): 8 mg via ORAL
  Filled 2018-02-03 (×2): qty 1

## 2018-02-03 MED ORDER — CARVEDILOL 6.25 MG PO TABS: 6.2500 mg | ORAL_TABLET | Freq: Two times a day (BID) | ORAL | Status: DC

## 2018-02-03 MED ORDER — DEXTROSE-NACL 5-0.45 % IV SOLN
INTRAVENOUS | Status: AC
  Administered 2018-02-03: 16:00:00 via INTRAVENOUS

## 2018-02-03 MED ORDER — SODIUM CHLORIDE 0.9 % IV SOLN
250.0000 mg | Freq: Once | INTRAVENOUS | Status: AC
  Administered 2018-02-03: 250 mg via INTRAVENOUS
  Filled 2018-02-03: qty 2.5

## 2018-02-03 MED ORDER — APIXABAN 5 MG PO TABS: 5.0000 mg | ORAL_TABLET | Freq: Two times a day (BID) | ORAL | Status: DC

## 2018-02-03 MED ORDER — POTASSIUM CHLORIDE CRYS ER 10 MEQ PO TBCR
10.0000 meq | EXTENDED_RELEASE_TABLET | Freq: Two times a day (BID) | ORAL | Status: DC
  Filled 2018-02-03: qty 1

## 2018-02-03 MED ORDER — FUROSEMIDE 40 MG PO TABS: 40.0000 mg | ORAL_TABLET | Freq: Every day | ORAL | Status: DC

## 2018-02-03 MED ORDER — DEXTROSE-NACL 5-0.45 % IV SOLN
INTRAVENOUS | Status: AC
  Administered 2018-02-03: 12:00:00 via INTRAVENOUS

## 2018-02-03 MED ORDER — ONDANSETRON HCL 8 MG PO TABS: 8.0000 mg | ORAL_TABLET | Freq: Three times a day (TID) | ORAL | 3 refills | Status: DC | PRN

## 2018-02-03 MED ORDER — SPIRONOLACTONE 100 MG PO TABS: 100.0000 mg | ORAL_TABLET | Freq: Every day | ORAL | Status: DC

## 2018-02-03 MED ORDER — THYROID 60 MG PO TABS: 90.0000 mg | ORAL_TABLET | Freq: Every day | ORAL | Status: DC

## 2018-02-03 MED ORDER — PREDNISONE 5 MG PO TABS: 10.0000 mg | ORAL_TABLET | Freq: Every day | ORAL | Status: DC

## 2018-02-03 MED ORDER — METFORMIN HCL 500 MG PO TABS: 500.0000 mg | ORAL_TABLET | Freq: Two times a day (BID) | ORAL | Status: DC

## 2018-02-03 MED ORDER — HEPARIN SOD (PORK) LOCK FLUSH 100 UNIT/ML IV SOLN: 500.0000 [IU] | INTRAVENOUS | Status: DC | PRN

## 2018-02-03 MED ORDER — SODIUM CHLORIDE 0.9 % IV SOLN: 400.0000 mg | Freq: Once | INTRAVENOUS | Status: DC

## 2018-02-03 MED ORDER — BENZONATATE 100 MG PO CAPS: 100.0000 mg | ORAL_CAPSULE | Freq: Three times a day (TID) | ORAL | Status: DC | PRN

## 2018-02-03 MED ORDER — HYDROCORTISONE NA SUCCINATE PF 100 MG IJ SOLR
100.0000 mg | Freq: Once | INTRAMUSCULAR | Status: AC
  Administered 2018-02-03: 100 mg via INTRAVENOUS
  Filled 2018-02-03: qty 2

## 2018-02-03 MED ORDER — SULFAMETHOXAZOLE-TRIMETHOPRIM 400-80 MG PO TABS
1.0000 | ORAL_TABLET | Freq: Every evening | ORAL | Status: DC
  Filled 2018-02-03: qty 1

## 2018-02-03 MED ORDER — DEXTROSE 5 % IV BOLUS
1000.0000 mg | Freq: Once | INTRAVENOUS | Status: AC
  Administered 2018-02-03: 1000 mg via INTRAVENOUS
  Filled 2018-02-03: qty 50

## 2018-02-03 MED ORDER — NINTEDANIB ESYLATE 150 MG PO CAPS: 150.0000 mg | ORAL_CAPSULE | Freq: Two times a day (BID) | ORAL | Status: DC

## 2018-02-03 MED ORDER — MONTELUKAST SODIUM 10 MG PO TABS: 10.0000 mg | ORAL_TABLET | Freq: Every day | ORAL | Status: DC

## 2018-02-03 NOTE — Telephone Encounter (Signed)
Discussed with PCP [name], MD\ - he feels better off patient getting IV cytoxan through [name]

## 2018-02-03 NOTE — Discharge Summary (Signed)
Physician Discharge Summary  Patient ID: Tasha Ballard MRN: [id number] DOB/AGE: 09/24/1970 47 y.o.  Admit date: 02/03/2018 Discharge date: 02/03/2018    Discharge Diagnoses:  Non-Specific Interstitial Pneumonitis  Interstitial Lung Disease  Chronic Hypoxic Respiratory Failure  DM  CHF without acute decompensation.                                                                      DISCHARGE PLAN BY DIAGNOSIS       Non-Specific Interstitial Pneumonitis  Interstitial Lung Disease  Chronic Hypoxic Respiratory Failure   Discharge Plan: Follow up with Rexene Edison, NP on 10/23 for post infusion follow up.  Will need CBC, BMP and LFT's at time of follow up.  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 [name] who was admitted for observation for IV cytoxan administration.    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  SHe was treated with CellCept.  In 2016, she had a clinical decline and was started on 2L oxygen.  Overtime her O2 needs a fluxed to 8-15L with activity.  She has been treated with Rituxan infusions x2 in April & May 2019.  She was seen at [facility] by Dr. Threasa Alpha in September 2019.  She was felt at that time to have progressive NSIP and started on oral cytoxan in November 2019.   She had progressive decline requiring high flow O2 and after discussion with Dr. Elisabeth Cara recommended repeat IV cytoxan.   Port-a-cath was placed as an outpatient.  Due to high O2 needs, she was admitted 12/13 for observation during therapy of IV cyclophosphamide.  Per protocol, the patient underwent infusion of cyclophosphamide without difficulty.  Protocol was completed and the patient was cleared for discharge home with plans as above.              SIGNIFICANT DIAGNOSTIC STUDIES 12/13  Admit for observation / IV cyclophosphamide  [facility] Pharmacy   Discharge Exam- see Dr. Golden Pop note from 12/13 for full exam General: adult female sitting in bed in NAD Neuro: AAOx4, speech clear, MAE  PULM: no increased work of breathing, 5L O2 via [place]  Extremities: pink  Vitals:   02/03/18 1056 02/03/18 1441  BP: 133/90 (!) 104/47  Pulse: 81 83  Resp: 20   Temp: (!) 97.4 F (36.3 C) 98.1 F (36.7 C)  TempSrc: Oral Oral  SpO2: 100% 98%     Discharge Labs  BMET Recent Labs  Lab 01/30/18 1133 02/03/18 1055  NA 139 137  K 4.1 3.7  CL 95* 95*  CO2 35* 27  GLUCOSE 104*  219*  BUN 12 14  CREATININE 0.59 0.77  CALCIUM 10.3 8.5*    CBC Recent Labs  Lab 01/30/18 1133 02/03/18 1055  HGB 11.7* 10.5*  HCT 37.6 35.9*  WBC 7.7 7.9  PLT 296.0 248    Follow-up Information    Parrett, [name] S, NP Follow up on 02/13/2018.   Specialty:  Pulmonary Disease Why:  12/23 - Appt at 2:30PM.  Please arrive at 2:15 PM for check-in.  You will have follow up labs drawn this day.   1/10 -Appt at 9:00 AM.  Please arrive at 8:45 am for check in.  You will be admitted for IV therapy from the office.  Contact information: [address] [place] [address] [phone]            Allergies as of 02/03/2018      Reactions   Macrobid [nitrofurantoin Macrocrystal] Nausea And Vomiting   Adhesive [tape] Rash      Medication List    STOP taking these medications   cyclophosphamide 25 MG capsule Commonly known as:  CYTOXAN     TAKE these medications   ALDACTONE 100 MG tablet Generic drug:   spironolactone Take 100 mg by mouth daily.   apixaban 5 MG Tabs tablet Commonly known as:  ELIQUIS Take 5 mg by mouth 2 (two) times daily.   benzonatate 100 MG capsule Commonly known as:  TESSALON Take 100 mg by mouth 3 (three) times daily as needed for cough.   carvedilol 6.25 MG tablet Commonly known as:  COREG Take 6.25 mg by mouth 2 (two) times daily with a meal.   cholecalciferol 25 MCG (1000 UT) tablet Commonly known as:  VITAMIN D Take 1,000 Units by mouth daily.   furosemide 40 MG tablet Commonly known as:  LASIX Take 40 mg by mouth See admin instructions. Take 1 tablet (40 mg) daily, may repeat dose if needed for fluid retention.   metFORMIN 500 MG tablet Commonly known as:  GLUCOPHAGE Take 500 mg by mouth 2 (two) times daily.   montelukast 10 MG tablet Commonly known as:  SINGULAIR Take 10 mg by mouth daily after supper.   OFEV 150 MG Caps Generic drug:  Nintedanib Take 150 mg by mouth 2 (two) times daily.   ondansetron 8 MG tablet Commonly known as:  ZOFRAN Take 1 tablet (8 mg total) by mouth every 8 (eight) hours as needed for nausea or vomiting.   potassium chloride 10 MEQ tablet Commonly known as:  K-DUR Take 10 mEq by mouth 2 (two) times daily.   predniSONE 10 MG tablet Commonly known as:  DELTASONE Take 10-36m as needed for SOB What changed:    how much to take  how to take this  when to take this  additional instructions   PROAIR HFA 108 (90 Base) MCG/ACT inhaler Generic drug:  albuterol Inhale 2 puffs into the lungs every 6 (six) hours as needed for wheezing or shortness of breath.   sulfamethoxazole-trimethoprim 400-80 MG tablet Commonly known as:  BACTRIM Once a day while on prednisone What changed:    how much to take  how to take this  when to take this  additional instructions   thyroid 90 MG tablet Commonly known as:  ARMOUR Take 90 mg by mouth daily before breakfast.         Disposition:  Home, no new home  health needs identified at time of discharge.   Discharged Condition: Tasha JEANGILLEShas met maximum  benefit of inpatient care and is medically stable and cleared for discharge.  Patient is pending follow up as above.      Time spent on disposition:  35 Minutes.   Signed: Noe Gens, NP-C [facility] Pgr: [phone] Office: [phone]

## 2018-02-03 NOTE — Progress Notes (Signed)
Chemotherapy dosing for Cyclophosphamide verified with Rodney Langton, RN based on patients BSA and specific dosing ordered for this patient.

## 2018-02-03 NOTE — Telephone Encounter (Signed)
Will route to MR per request.

## 2018-02-03 NOTE — Progress Notes (Signed)
_0  ID: Tasha Ballard, female    DOB: [birth year], 47 y.o.   MRN: [id number]  Chief Complaint  Patient presents with  . Follow-up    Direct admit     Referring provider: [name], MD  HPI: 47 year old female never smoker seen for pulmonary consult November 22, 2017 to establish for evaluation of interstitial lung disease.  She has been followed at [facility].  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  At that time she was placed on CellCept.  In 2016 had a clinical decline.  And started on oxygen at 2 L.  In 2016.  Since this time patient has had a progressive decline with increased oxygen demands.  She is currently on 6-8 L at rest and 15 L with activity.  Received Rituxan infusions x2 in April and May 2019.  Patient was seen at [facility] Dr. Threasa Alpha.  In September 2019.  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 Since this surgery has been progressively getting worse.   TEST/EVENTS :  Investigations at [facility] -November 09, 2017: Echocardiogram ejection fraction 70-75% with grade 2 diastolic dysfunction.  Right ventricle was mildly enlarged but global RV function was felt to be normal.  No pericardial effusion no obvious right-to-left shunt at rest or cough or Valsalva on agitated saline contrast exam and stage I right ventricular diastolic dysfunction.  Pulmonary artery systolic pressure at 45 mmHg  -Lung function November 08, 2017: FVC 0.9 L / 27%, DLCO 5.86/29%  - HRCT 11/08/17 Rt diaphragm elevation ? Eventration v paresis.  Esophagogram November 11, 2017 - > 11/11/17 [name] - Normal  Dr. Threasa Alpha at [facility].  She was started on cytoxan 63m per day 12/23/17 and increaed to 1024mon 12/23/17.  On 01/10/18.  increase to 12572mer day .  02/03/2018 OV for Direct Admit for IV  Cytoxan  Patient presents to the [facility] today [facility].  She says her breathing is at baseline.  She remains on oxygen 4 to 6 L at rest and 10 to 15 L with activity.  He says her leg swelling is at baseline.  She remains on Lasix 40 mg daily and Aldactone 100 mg.  She is on chronic steroids with prednisone 10 mg daily. She has been on 05 x1 week.  Says she is tolerating with no nausea vomiting or diarrhea.  Does have some mild constipation.  Patient says she had a Port-A-Cath placed 1 week ago. Patient has chronic shortness of breath gets winded with minimal activity.  Has intermittent dry cough.      Allergies  Allergen Reactions  . Macrobid [Nitrofurantoin Macrocrystal] Nausea And Vomiting    Immunization History  Administered Date(s) Administered  . Influenza,inj,Quad PF,6+ Mos 12/24/2014, 02/05/2016, 01/07/2017, 11/22/2017  . Pneumococcal Conjugate-13 07/20/2017  . Pneumococcal Polysaccharide-23 06/09/2012    Past Medical History:  Diagnosis Date  . [place] ([facility] . [facility] [place] . Pulmonary fibrosis ([facility] . Thyroid disease     Tobacco History: Social History   Tobacco Use  Smoking Status Never Smoker  Smokeless Tobacco Never Used   Counseling given: Not Answered   Outpatient Medications Prior to Visit  Medication Sig Dispense Refill  . apixaban (ELIQUIS) 5 MG TABS tablet Take 5 mg by mouth 2 (  two) times daily.    . benzonatate (TESSALON) 100 MG capsule Take 100 mg by mouth 3 (three) times daily as needed for cough.     . carvedilol (COREG) 6.25 MG tablet Take 6.25 mg by mouth 2 (two) times daily with a meal.    . cholecalciferol (VITAMIN D) 25 MCG (1000 UT) tablet Take 1,000 Units by mouth daily.    . cyclophosphamide (CYTOXAN) 25 MG capsule Take 5 capsules (125 mg total) by mouth daily. Give on an empty stomach 1 hour before or 2 hours after meals. 150 capsule 1  .  furosemide (LASIX) 40 MG tablet Take 40 mg by mouth See admin instructions. Take 1 tablet (40 mg) daily, may repeat dose if needed for fluid retention.    . metFORMIN (GLUCOPHAGE) 500 MG tablet Take 500 mg by mouth 2 (two) times daily.     . montelukast (SINGULAIR) 10 MG tablet Take 10 mg by mouth daily after supper.    . predniSONE (DELTASONE) 10 MG tablet Take 10-42m as needed for SOB (Patient taking differently: Take 10 mg by mouth daily. ) 30 tablet 3  . PROAIR HFA 108 (90 Base) MCG/ACT inhaler Inhale 2 puffs into the lungs every 6 (six) hours as needed for wheezing or shortness of breath.  4  . spironolactone (ALDACTONE) 100 MG tablet Take 100 mg by mouth daily.    .Marland Kitchensulfamethoxazole-trimethoprim (BACTRIM) 400-80 MG tablet Once a day while on prednisone (Patient taking differently: Take 1 tablet by mouth every evening. ) 90 tablet 1  . thyroid (ARMOUR) 90 MG tablet Take 90 mg by mouth daily before breakfast.     . ondansetron (ZOFRAN) 8 MG tablet Take 1 tablet (8 mg total) by mouth every 8 (eight) hours as needed for nausea or vomiting. (Patient not taking: Reported on 02/03/2018) 90 tablet 0   No facility-administered medications prior to visit.      Review of Systems  Constitutional:   No  weight loss, night sweats,  Fevers, chills, + fatigue, or  lassitude.  HEENT:   No headaches,  Difficulty swallowing,  Tooth/dental problems, or  Sore throat,                No sneezing, itching, ear ache,  +nasal congestion, post nasal drip,   CV:  No chest pain,  Orthopnea, PND, +swelling in lower extremities,  No anasarca, dizziness, palpitations, syncope.   GI  No heartburn, indigestion, abdominal pain, nausea, vomiting, diarrhea, change in bowel habits, loss of appetite, bloody stools.   Resp:  No chest wall deformity  Skin: no rash or lesions.  GU: no dysuria, change in color of urine, no urgency or frequency.  No flank pain, no hematuria   MS:  No joint pain or swelling.  No  decreased range of motion.  No back pain.    Physical Exam  BP 122/76 (BP Location: Left Arm, Cuff Size: Normal)   Pulse 93   Temp 97.6 F (36.4 C) (Oral)   Ht _0  (1.575 m)   Wt 223 lb 3.2 oz (101.2 kg)   SpO2 93%   BMI 40.82 kg/m   GEN: A/Ox3; pleasant , NAD,  Obese    HEENT:  [place]/AT,   , TMs-wnl, NOSE-clear, THROAT-clear, no lesions, no postnasal drip or exudate noted.   NECK:  Supple w/ fair ROM; no JVD; normal carotid impulses w/o bruits; no thyromegaly or nodules palpated; no lymphadenopathy.    RESP  BB crackles   no accessory  muscle use, no dullness to percussion  CARD:  RRR, no m/r/g, tr peripheral edema, pulses intact, no cyanosis or clubbing.  GI:   Soft & nt; nml bowel sounds; no organomegaly or masses detected.   Musco: Warm bil, no deformities or joint swelling noted.   Neuro: alert, no focal deficits noted.    Skin: Warm, no lesions or rashes    Lab Results:  CBC    Component Value Date/Time   WBC 7.7 01/30/2018 1133   RBC 4.74 01/30/2018 1133   HGB 11.7 (L) 01/30/2018 1133   HCT 37.6 01/30/2018 1133   PLT 296.0 01/30/2018 1133   MCV 79.3 01/30/2018 1133   MCH 23.7 (L) 01/26/2018 0715   MCHC 31.0 01/30/2018 1133   RDW 15.8 (H) 01/30/2018 1133   LYMPHSABS 0.7 01/17/2018 1039   MONOABS 0.6 01/17/2018 1039   EOSABS 0.1 01/17/2018 1039   BASOSABS 0.1 01/17/2018 1039    BMET    Component Value Date/Time   NA 139 01/30/2018 1133   K 4.1 01/30/2018 1133   CL 95 (L) 01/30/2018 1133   CO2 35 (H) 01/30/2018 1133   GLUCOSE 104 (H) 01/30/2018 1133   BUN 12 01/30/2018 1133   CREATININE 0.59 01/30/2018 1133   CALCIUM 10.3 01/30/2018 1133   GFRNONAA >60 01/26/2018 0715   GFRAA >60 01/26/2018 0715    BNP No results found for: BNP  ProBNP No results found for: PROBNP  Imaging: Ir Imaging Guided Port Insertion  Result Date: 01/26/2018 CLINICAL DATA:  IPF, Chronic respiratory disease, needs durable venous access. Anticipated pulmonary  transplant. EXAM: TUNNELED PORT CATHETER PLACEMENT WITH ULTRASOUND AND FLUOROSCOPIC GUIDANCE FLUOROSCOPY TIME:  0.1 minute; 7  uGym2 DAP ANESTHESIA/SEDATION: Intravenous Fentanyl and Versed were administered as conscious sedation during continuous monitoring of the patient's level of consciousness and physiological / cardiorespiratory status by the radiology RN, with a total moderate sedation time of 17 minutes. TECHNIQUE: The procedure, risks, benefits, and alternatives were explained to the patient. Questions regarding the procedure were encouraged and answered. The patient understands and consents to the procedure. As antibiotic prophylaxis, cefazolin 2 g was ordered pre-procedure and administered intravenously within one hour of incision. Patency of the right IJ vein was confirmed with ultrasound with image documentation. An appropriate skin site was determined. Skin site was marked. Region was prepped using maximum barrier technique including cap and mask, sterile gown, sterile gloves, large sterile sheet, and Chlorhexidine as cutaneous antisepsis. The region was infiltrated locally with 1% lidocaine. Under real-time ultrasound guidance, the right IJ vein was accessed with a 21 gauge micropuncture needle; the needle tip within the vein was confirmed with ultrasound image documentation. Needle was exchanged over a 018 guidewire for transitional dilator which allowed passage of the [facility] wire into the IVC. Over this, the transitional dilator was exchanged for a 5 [place] MPA catheter. A small incision was made on the right anterior chest wall and a subcutaneous pocket fashioned. The power-injectable port was positioned and its catheter tunneled to the right IJ dermatotomy site. The MPA catheter was exchanged over an Amplatz wire for a peel-away sheath, through which the port catheter, which had been trimmed to the appropriate length, was advanced and positioned under fluoroscopy with its tip at the cavoatrial  junction. Spot chest radiograph confirms good catheter position and no pneumothorax. The pocket was closed with deep interrupted and subcuticular continuous 3-0 Monocryl sutures. The port was flushed per protocol. The incisions were covered with Dermabond then covered with a sterile  dressing. COMPLICATIONS: COMPLICATIONS None immediate IMPRESSION: Technically successful right IJ power-injectable port catheter placement. Ready for routine use. Electronically Signed   By: Lucrezia Europe M.D.   On: 01/26/2018 12:43      No flowsheet data found.  No results found for: NITRICOXIDE      Assessment & Plan:       Rexene Edison, NP 02/03/2018

## 2018-02-03 NOTE — H&P (Addendum)
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   History of present illness   47 year old female never smoker seen for pulmonary consult November 22, 2017 to establish for evaluation of interstitial lung disease.  She has been followed at [facility].  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  At that time she was placed on CellCept.  In 2016 had a clinical decline.  And started on oxygen at 2 L.  In 2016.  Since this time patient has had a progressive decline with increased oxygen demands.  She is currently on 8 L at rest and 15 L with activity.  Received Rituxan nfusions x2 in April and May 2019.  Patient was seen at [facility] Dr. Threasa Alpha.  In September 2019.  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 Started on oral Cytoxan November 2019. 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  Since this surgery she has felt that she is gotten steadily worse.  Patient has been recommended to begin IV Cytoxan.  Dr. [name] has been in contact with [facility] with Dr. Threasa Alpha with recommendation to begin IV Cytoxan protocol.  She has been counseled by Dr. [name] extensively regarding this protocol and potential complications.  She has had a progressive decline and requiring high flow oxygen.  She has a daily dry cough.  Gets winded with minimal activity.  She was started on OFF last week.  Says that she is tolerating without any nausea vomiting or diarrhea.  Does have some mild constipation.  Will be admitted for observation for IV infusion of Cytoxan in the inpatient setting.   Protocol including IV hydration labs will be completed prior to infusion.  Premedication regimen has been discussed.  Patient has been sent a Zofran prescription for outpatient usage.. She got her Port-A-Cath placed last week. Please see IV Cytoxan protocol below.   Past Medical History  Diastolic CHF , DM   Significant Hospital Events     Consults:    Procedures:    Significant Diagnostic Tests:  Investigations at [facility] -November 09, 2017: Echocardiogram ejection fraction 70-75% with grade 2 diastolic dysfunction. Right ventricle was mildly enlarged but global RV function was felt to be normal. No pericardial effusion no obvious right-to-left shunt at rest or cough or Valsalva on agitated saline contrast exam and stage I right ventricular diastolic dysfunction. Pulmonary artery systolic pressure at 45 mmHg  -Lung function November 08, 2017: FVC 0.9 L / 27%, DLCO 5.86/29%  - HRCT 11/08/17 Rt diaphragm elevation ? Eventration v paresis. Sniff test recommendd  Esophagogram November 11, 2017 - > 11/11/17 Natil Jewsh - Normal  Micro Data:    Antimicrobials:    Interim history/subjective:  Admit from office for cytoxan IV infusion   Objective       Examination: GEN: A/Ox3; pleasant , NAD, obese    HEENT:  [place]/AT,   , TMs-wnl, NOSE-clear drainage THROAT-clear, no lesions, no postnasal drip or exudate noted.   NECK:  Supple w/ fair ROM; no JVD; normal carotid impulses w/o bruits; no thyromegaly or nodules palpated; no lymphadenopathy.  RESP  BClear  P & A; w/o, wheezes/ rales/ or rhonchi. no accessory muscle use, no dullness to percussion  CARD:  RRR, no m/r/g  , no peripheral edema, pulses intact, no cyanosis or clubbing.  GI:   Soft & nt; nml bowel sounds; no organomegaly or masses detected.   Musco: Warm bil, no deformities or joint swelling noted.   Neuro: alert, no focal deficits noted.    Skin: Warm, no lesions or rashes \   Resolved Hospital  Problem list     Assessment & Plan:  1. Progressive NSIP   Plan    [place] Interstitial Lung Disease Program IV Cyclophosphamide Protocol - Modified from [facility] Protocol  Nov 2019, Version 1.0  Pre-dosing 1. Give outpatient script for Zofran - from [place] - MD to sign this- Zofran  13m every 8h as needed for 4 weeks with 3 refills 2. CJaralesor EWashington or MSulphurto ensure they can administer protocol 3. [place] protocol [facility] because it is classified as chemotherapy   Dosing and Infusion and Schedule of IV Cyclophosphamide: 1. Determine BSA (body surface area) in m2= the square root of [ ht (cm) X wt (kg)] divided by 3600  (157.5 cm x 101.2 kg = 15,939 /3600 = 4.4275  = BSA= 2.131m 2. Draw STAT CBC, CMP, UA prior to infusion - > results to be paged to MD prior to infusion start  3. Ensure adequate hydration: D5  NS _0  cc/hr for 2 hours  4. To help prevent nausea and vomiting give:  . Marland Kitchenydrocortisone 10067mV x 1: 1-2h prior to infusion . Zofran 8mg54m 30 minutes prior to cyclophosphamide infusion then . Zofran 8mg 39m4 hours and 8 hours post infusion  Other options that MD can be paged for: . Ativan 1mg p78m Compazine 15mg p48mDecadron 10mg IV80mone time dose  5. To prevent bladder toxicity MESNA should be given:  . Mesna Marland Kitchen5% of total cyclophosphamide in mg, IV 30 minutes prior to cyclophosphamide . Repeat Mesna 25% of cyclophosphamide dose at 1 hour post infusion  6.  Page MD with results of blood work before starting IV infusion of cyclophosphamide  7.  Infuse IV cyclophosphamide  (0.5gm-1gm/m2) in 150cc D5W over 1 hour . (If GFR <1/3 start at 0.5gm/m2  dose, otherwise start at 0.5gm/m2 ). From 2nd dose if well tolerated and GFR ok can administer for 1gm/m2  -Begin with Cytoxan dose 0.5gm /m2 dose   Cytoxan 1000mg IV 34m /150ccD5W over 1 hr .  Pending lab results .   8. Continue D5  NS at 500 cc/hr for 1 hour  9. Monitor WBC in 10-14 days   . If <1500 reduce dose by 0.25g/m2 . If > 4000 increase dose by max of 1 gm/m2 (i.e. max dose of IV cyclophosphamide is 2 gm/m2 )           9. Schedule: Every 4 weeks       10. Patient should USE contraception at all time for intercourse   2. Chronic Respiratory Failure on high flow oxygen   Plan  Keep oxygen sats >88-90%   3. Diabetes   Plan  Continue on Metformin   4. Diastolic CHF   Plan  Continue home lasix and aldactone and coreg   [name] Parrett NP-C  [place] [facility]  Pager [phone]  Cell [phone]    02/03/2018   9:55 AM      ATTESTATION & SIGNATURE   STAFF NOTE: I, Dr Ann Lions have personally reviewed patient's available data, including medical history, events of note, physical examination and test results as part of my evaluation. I have discussed with resident/NP and other care providers such as pharmacist, RN and RRT.  In addition,  I personally evaluated patient and elicited key findings of   S: Progressive NSIP with chronic hypoxemic respiratory failure.  Previously on CellCept.  After visiting [facility] plan was changed to Cytoxan.  She started oral Cytoxan approximately 4 weeks ago.  She really wants to do IV Cytoxan.  The protocol has been given to [name] by [place] center Dr. Threasa Alpha.  The ultimate goal is to get a lung transplant but she would need to lose significant amount of weight.  In the last week or so she has been started on nintedanib.  Today presents for pre-admit and pre-IV Cytoxan evaluation.  Doing stable.  Most recent labs are fine.  There was a tendency towards a drop in Kuhl count on oral Cytoxan but is still adequate.  O: Obese on oxygen and crackles and looks well   LABS    PULMONARY No results for input(s): PHART, PCO2ART, PO2ART, HCO3, TCO2, O2SAT in the last 168  hours.  Invalid input(s): PCO2, PO2  CBC Recent Labs  Lab 01/30/18 1133 02/03/18 1055  HGB 11.7* 10.5*  HCT 37.6 35.9*  WBC 7.7 7.9  PLT 296.0 248    COAGULATION No results for input(s): INR in the last 168 hours.  CARDIAC  No results for input(s): TROPONINI in the last 168 hours. No results for input(s): PROBNP in the last 168 hours.   CHEMISTRY Recent Labs  Lab 01/30/18 1133 02/03/18 1055  NA 139 137  K 4.1 3.7  CL 95* 95*  CO2 35* 27  GLUCOSE 104* 219*  BUN 12 14  CREATININE 0.59 0.77  CALCIUM 10.3 8.5*   Estimated Creatinine Clearance: 94.7 mL/min (by C-G formula based on SCr of 0.77 mg/dL).   LIVER Recent Labs  Lab 01/30/18 1133 02/03/18 1055  AST 15 28  ALT 13 23  ALKPHOS 74 64  BILITOT 0.4 0.6  PROT 7.0 6.0*  ALBUMIN 4.1 3.4*     INFECTIOUS No results for input(s): LATICACIDVEN, PROCALCITON in the last 168 hours.   ENDOCRINE CBG (last 3)  No results for input(s): GLUCAP in the last 72 hours.       IMAGING x48h  - image(s) personally visualized  -   highlighted in bold No results found.   A: Progressive NSIP with chronic hypoxemic respiratory failure  P: IV Cytoxan protocol -started 500 mg [0.5 g] per meter square.  Patient has a body surface area of 2.1 and therefore this would translate into thousand milligram [1 g] IV Cytoxan dose.  I personally discussed with Dr. Elisabeth Cara at [facility] today over the phone.  Literature review also indicates that in the case of scleroderma ILD these are the standard doses of IV Cytoxan  Patient is fully aware to be abstinent from intercourse and also the risk of neutropenia and sepsis with this regimen.  And hemorrhagic cystitis.  She and her husband were updated.     Rest per NP/medical resident whose note is outlined above and that I agree with    Dr. Brand Males, M.D., [facility]C.P Pulmonary and Critical Care Medicine Staff Physician [place]  [place] [facility]  [facility] Pager: [phone], If no answer or between  15:00h - 7:00h: call [phone]  02/03/2018 12:49 PM         Best practice:  Diet: Heart healthy  Pain/Anxiety/Delirium protocol (if indicated): na VAP protocol (if indicated): na DVT prophylaxis: Eliquis  GI prophylaxis: na  Glucose control: diet  Mobility: act as tolerated.  Code Status: Full  Family Communication: pt/husband  Disposition: observation status   Labs   CBC: Recent Labs  Lab 01/30/18 1133  WBC 7.7  HGB 11.7*  HCT 37.6  MCV 79.3  PLT 419.6    Basic Metabolic Panel: Recent Labs  Lab 01/30/18 1133  NA 139  K 4.1  CL 95*  CO2 35*  GLUCOSE 104*  BUN 12  CREATININE 0.59  CALCIUM 10.3   GFR: Estimated Creatinine Clearance: 94.7 mL/min (by C-G formula based on SCr of 0.59 mg/dL). Recent Labs  Lab 01/30/18 1133  WBC 7.7    Liver Function Tests: Recent Labs  Lab 01/30/18 1133  AST 15  ALT 13  ALKPHOS 74  BILITOT 0.4  PROT 7.0  ALBUMIN 4.1   No results for input(s): LIPASE, AMYLASE in the last 168 hours. No results for input(s): AMMONIA in the last 168 hours.  ABG No results found for: PHART, PCO2ART, PO2ART, HCO3, TCO2, ACIDBASEDEF, O2SAT   Coagulation Profile: No results for input(s): INR, PROTIME in the last 168 hours.  Cardiac Enzymes: No results for input(s): CKTOTAL, CKMB, CKMBINDEX, TROPONINI in the last 168 hours.  HbA1C: No results found for: HGBA1C  CBG: No results for input(s): GLUCAP in the last 168 hours.  Review of Systems:   Constitutional:   No  weight loss, night sweats,  Fevers, chills, + fatigue, or  lassitude.  HEENT:   No headaches,  Difficulty swallowing,  Tooth/dental problems, or  Sore throat,                No sneezing, itching, ear ache, nasal congestion, post nasal drip,   CV:  No chest pain,  Orthopnea, PND, +swelling in lower extremities,  No anasarca, dizziness, palpitations, syncope.   GI  No heartburn, indigestion,  abdominal pain, nausea, vomiting, diarrhea, change in bowel habits, loss of appetite, bloody stools.   Resp   No chest wall deformity  Skin: no rash or lesions.  GU: no dysuria, change in color of urine, no urgency or frequency.  No flank pain, no hematuria   MS:  No joint pain or swelling.  No decreased range of motion.  No back pain.  Psych:  No change in mood or affect. No depression or anxiety.  No memory loss.       Past Medical History  She,  has a past medical history of Aneurysm ([place]), Diabetes mellitus without complication ([place]), Pulmonary fibrosis ([place]), and Thyroid disease.   Surgical History    Past Surgical History:  Procedure Laterality Date  . IR IMAGING GUIDED PORT INSERTION  01/26/2018     Social History   reports that she has never smoked. She has never used smokeless tobacco. She reports previous alcohol use. She reports that she does not use drugs.   Family History   Her family history includes Heart failure in her mother; Rheumatologic disease in her father.   Allergies Allergies  Allergen Reactions  . Macrobid [Nitrofurantoin Macrocrystal] Nausea And Vomiting     Home Medications  Prior to Admission medications   Medication Sig  Start Date End Date Taking? Authorizing Provider  apixaban (ELIQUIS) 5 MG TABS tablet Take 5 mg by mouth 2 (two) times daily.    [provider]  benzonatate (TESSALON) 100 MG capsule Take 100 mg by mouth 3 (three) times daily as needed for cough.     [provider]  carvedilol (COREG) 6.25 MG tablet Take 6.25 mg by mouth 2 (two) times daily with a meal.    [provider]  cholecalciferol (VITAMIN D) 25 MCG (1000 UT) tablet Take 1,000 Units by mouth daily.    [provider]  cyclophosphamide (CYTOXAN) 25 MG capsule Take 5 capsules (125 mg total) by mouth daily. Give on an empty stomach 1 hour before or 2 hours after meals. 01/10/18   Brand Males, MD  furosemide (LASIX) 40 MG tablet  Take 40 mg by mouth See admin instructions. Take 1 tablet (40 mg) daily, may repeat dose if needed for fluid retention.    [provider]  metFORMIN (GLUCOPHAGE) 500 MG tablet Take 500 mg by mouth 2 (two) times daily.     [provider]  montelukast (SINGULAIR) 10 MG tablet Take 10 mg by mouth daily after supper.    [provider]  ondansetron (ZOFRAN) 8 MG tablet Take 1 tablet (8 mg total) by mouth every 8 (eight) hours as needed for nausea or vomiting. Patient not taking: Reported on 02/03/2018 01/25/18   Brand Males, MD  predniSONE (DELTASONE) 10 MG tablet Take 10-3m as needed for SOB Patient taking differently: Take 10 mg by mouth daily.  11/22/17   RBrand Males MD  PROAIR HFA 108 ([phone]) MCG/ACT inhaler Inhale 2 puffs into the lungs every 6 (six) hours as needed for wheezing or shortness of breath. 01/10/18   [provider]  spironolactone (ALDACTONE) 100 MG tablet Take 100 mg by mouth daily.    [provider]  sulfamethoxazole-trimethoprim (BACTRIM) 400-80 MG tablet Once a day while on prednisone Patient taking differently: Take 1 tablet by mouth every evening.  11/22/17   RBrand Males MD  thyroid (ARMOUR) 90 MG tablet Take 90 mg by mouth daily before breakfast.     [provider]     Critical care time:         TRexene EdisonNP-C  [place] [facility]  Pager [phone] Cell [phone]   02/03/2018   9:55 AM

## 2018-02-03 NOTE — Assessment & Plan Note (Addendum)
Progressive NSIP - with progressive decline  For admission today by Dr. [name] for IV cytoxan protocol   Plan   [place] Interstitial Lung Disease Program IV Cyclophosphamide Protocol - Modified from [facility] Protocol  Nov 2019, Version 1.0  Pre-dosing 1. Give outpatient script for Zofran - from [place] - MD to sign this- Zofran  75m every 8h as needed for 4 weeks with 3 refills 2. CJane Lewor E[place] or MWhitmoreto ensure they can administer protocol 3. [place] protocol [facility] because [facility] is [facility]   Dosing and Infusion and Schedule of IV Cyclophosphamide: 1. Determine BSA (body surface area) in m2= the square root of [ ht (cm) X wt (kg)] divided by 3600  2. Draw STAT CBC, CMP, UA prior to infusion - > results to be paged to MD prior to infusion start  3. Ensure adequate hydration: D5  NS _0  cc/hr for 2 hours  4. To help prevent nausea and vomiting give:  .Marland KitchenHydrocortisone 1040mIV x 1: 1-2h prior to infusion . Zofran 33m71mo 30 minutes prior to cyclophosphamide infusion then . Zofran 33mg333m 4 hours and 8 hours post infusion  Other options that MD can be paged for: . Ativan 1mg 60m. Compazine 15mg 51m Decadron 10mg I12m one time dose  5. To prevent bladder toxicity MESNA should be given:  . MesnaMarland Kitchen25% of total cyclophosphamide in mg, IV 30 minutes prior to cyclophosphamide . Repeat Mesna 25% of cyclophosphamide dose at 1 hour post infusion  6.  Page MD with results of blood work before starting IV infusion of cyclophosphamide  7.  Infuse IV cyclophosphamide  (0.5gm-1gm/m2) in 150cc D5W over 1 hour . (If GFR <1/3 start at 0.5gm/m2  dose, otherwise start at 0.75gm/m2 ). From 2nd dose if well tolerated and GFR ok can administer for 1gm/m2  8. Continue D5  NS at 500 cc/hr for 1 hour  9. Monitor WBC in 10-14 days   . If <1500 reduce dose by  0.25g/m2 . If > 4000 increase dose by max of 1 gm/m2 (i.e. max dose of IV cyclophosphamide is 2 gm/m2 )           9. Schedule: Every 4 weeks       10. Patient should USE contraception at all time for intercourse      SIGNATURE    02/03/2018

## 2018-02-03 NOTE — Progress Notes (Signed)
Nursing Discharge Summary  Patient ID: Tasha Ballard MRN: [id number] DOB/AGE: 08-28-1970 47 y.o.  Admit date: 02/03/2018 Discharge date: 02/03/2018  Discharged Condition: good  Disposition: Discharge disposition: 01-Home or Self Care       Follow-up Information    Parrett, Fonnie Mu, NP Follow up on 02/13/2018.   Specialty:  Pulmonary Disease Why:  12/23 - Appt at 2:30PM.  Please arrive at 2:15 PM for check-in.  You will have follow up labs drawn this day.   1/10 -Appt at 9:00 AM.  Please arrive at 8:45 am for check in.  You will be admitted for IV therapy from the office.  Contact information: [address]  [place] [address] [phone]           Prescriptions Given: Prescription for Zofran was called into patients pharmacy of choice.  Patients medications and follow up appointments discussed with patient and husband.  Both verbalized understanding without further questions.    Means of Discharge: Patient to be taken downstairs via wheelchair to be discharged home via private vehicle.    Signed: Buel Ream 02/03/2018, 5:59 PM

## 2018-02-03 NOTE — Patient Instructions (Signed)
Admit to hospital for IV cytoxan infusion

## 2018-02-03 NOTE — Addendum Note (Signed)
Addended by: Parke Poisson E on: 02/03/2018 12:34 PM   Modules accepted: Orders

## 2018-02-03 NOTE — Telephone Encounter (Signed)
Returned call to [place], [place].  Informed her that the Patient was being seen by [name] P, NP this morning, and would be sent to [facility]. Raquel Sarna, CMA, talked with her about Dr. [name] and [name] P, plan for observation.

## 2018-02-03 NOTE — Progress Notes (Signed)
Stat labs reviewed -   Ok to proceed with IV cytoxan 578m/meter square (10081mx 1 ) dose     SIGNATURE    Dr. MuBrand MalesM.D., F.C.C.P,  Pulmonary and Critical Care Medicine Staff Physician, [place] - Interstitial Lung Disease  Program  Pulmonary Fi[place]et [id number]: [phone] no answer or between  15:00h - 7:00h: call [phone] Telephone: [phone]  12:57 PM 02/03/2018     LABS    PULMONARY No results for input(s): PHART, PCO2ART, PO2ART, HCO3, TCO2, O2SAT in the last 168 hours.  Invalid input(s): PCO2, PO2  CBC Recent Labs  Lab 01/30/18 1133 02/03/18 1055  HGB 11.7* 10.5*  HCT 37.6 35.9*  WBC 7.7 7.9  PLT 296.0 248    COAGULATION No results for input(s): INR in the last 168 hours.  CARDIAC  No results for input(s): TROPONINI in the last 168 hours. No results for input(s): PROBNP in the last 168 hours.   CHEMISTRY Recent Labs  Lab 01/30/18 1133 02/03/18 1055  NA 139 137  K 4.1 3.7  CL 95* 95*  CO2 35* 27  GLUCOSE 104* 219*  BUN 12 14  CREATININE 0.59 0.77  CALCIUM 10.3 8.5*   Estimated Creatinine Clearance: 94.7 mL/min (by C-G formula based on SCr of 0.77 mg/dL).   LIVER Recent Labs  Lab 01/30/18 1133 02/03/18 1055  AST 15 28  ALT 13 23  ALKPHOS 74 64  BILITOT 0.4 0.6  PROT 7.0 6.0*  ALBUMIN 4.1 3.4*     INFECTIOUS No results for input(s): LATICACIDVEN, PROCALCITON in the last 168 hours.   ENDOCRINE CBG (last 3)  No results for input(s): GLUCAP in the last 72 hours.       IMAGING x48h  - image(s) personally visualized  -   highlighted in bold No results found.

## 2018-02-03 NOTE — Progress Notes (Signed)
Patient tolerated Cytoxan infusion without incident.  Patient to be discharged home

## 2018-02-04 LAB — HIV ANTIBODY (ROUTINE TESTING W REFLEX): HIV Screen 4th Generation wRfx: NONREACTIVE

## 2018-02-06 ENCOUNTER — Telehealth: Payer: Self-pay | Admitting: Internal Medicine

## 2018-02-06 DIAGNOSIS — J849 Interstitial pulmonary disease, unspecified: Secondary | ICD-10-CM

## 2018-02-06 NOTE — Telephone Encounter (Signed)
Called and spoke with patient, advised that orders have been placed. Nothing further needed.

## 2018-02-06 NOTE — Telephone Encounter (Signed)
[place]  emailed me - wants to cbc, bmet, mag, phos, LFT this week on Friday - 02/10/18

## 2018-02-06 NOTE — Telephone Encounter (Signed)
Called and spoke with pt due to mychart message that was sent to myself from pt.  Per pt, father-in-law Bethann Qualley is currently scheduled with [facility] for consult appt tomorrow, 02/07/18. Pt stated she has been talking with family members about father-in-law's health and they believe he has become worse and has recently not been feeling well. Pt stated to me they may be taking father-in-law to hospital for eval due to recent change in health with pt but she wanted to know what I thought about that.  I stated to her, if father-in-law has become worse and also if they felt like he should be taken to hosp, stated to her to go ahead and take him so he could be evaluated there and stated to her if he was admitted, we could cancel his current consult appt and reschedule it after discharge if he was admitted.  I stated to her that we were going to still keep his appt for now and if change needed to be made later, we could do so. Pt expressed understanding. Will await a return mychart message from pt as she stated she would let me know after further talking with family.

## 2018-02-09 ENCOUNTER — Other Ambulatory Visit (HOSPITAL_COMMUNITY): Payer: BLUE CROSS/BLUE SHIELD

## 2018-02-10 ENCOUNTER — Other Ambulatory Visit (INDEPENDENT_AMBULATORY_CARE_PROVIDER_SITE_OTHER): Payer: BLUE CROSS/BLUE SHIELD

## 2018-02-10 DIAGNOSIS — J849 Interstitial pulmonary disease, unspecified: Secondary | ICD-10-CM

## 2018-02-10 LAB — BASIC METABOLIC PANEL
BUN: 12 mg/dL (ref 6–23)
CO2: 31 mEq/L (ref 19–32)
Calcium: 9.2 mg/dL (ref 8.4–10.5)
Chloride: 90 mEq/L — ABNORMAL LOW (ref 96–112)
Creatinine, Ser: 0.82 mg/dL (ref 0.40–1.20)
GFR: 78.6 mL/min (ref >60.00)
Glucose, Bld: 237 mg/dL — ABNORMAL HIGH (ref 70–99)
Potassium: 3.7 mEq/L (ref 3.5–5.1)
Sodium: 138 mEq/L (ref 135–145)

## 2018-02-10 LAB — CBC WITH DIFFERENTIAL/PLATELET
BASOS PCT: 1.1 % (ref 0.0–3.0)
Basophils Absolute: 0.1 10*3/uL (ref 0.0–0.1)
EOS ABS: 0 10*3/uL (ref 0.0–0.7)
Eosinophils Relative: 0.4 % (ref 0.0–5.0)
HCT: 35.6 % — ABNORMAL LOW (ref 36.0–46.0)
Hemoglobin: 11.1 g/dL — ABNORMAL LOW (ref 12.0–15.0)
Lymphocytes Relative: 5.1 % — ABNORMAL LOW (ref 12.0–46.0)
Lymphs Abs: 0.3 10*3/uL — ABNORMAL LOW (ref 0.7–4.0)
MCHC: 31.1 g/dL (ref 30.0–36.0)
MCV: 80.3 fl (ref 78.0–100.0)
Monocytes Absolute: 0.3 10*3/uL (ref 0.1–1.0)
Monocytes Relative: 3.9 % (ref 3.0–12.0)
Neutro Abs: 5.8 10*3/uL (ref 1.4–7.7)
Neutrophils Relative %: 89.5 % — ABNORMAL HIGH (ref 43.0–77.0)
Platelets: 291 10*3/uL (ref 150.0–400.0)
RBC: 4.43 Mil/uL (ref 3.87–5.11)
RDW: 16.2 % — AB (ref 11.5–15.5)
WBC: 6.5 10*3/uL (ref 4.0–10.5)

## 2018-02-10 LAB — HEPATIC FUNCTION PANEL
ALT: 20 U/L (ref 0–35)
AST: 17 U/L (ref 0–37)
Albumin: 4 g/dL (ref 3.5–5.2)
Alkaline Phosphatase: 81 U/L (ref 39–117)
Bilirubin, Direct: 0 mg/dL (ref 0.0–0.3)
Total Bilirubin: 0.3 mg/dL (ref 0.2–1.2)
Total Protein: 6.7 g/dL (ref 6.0–8.3)

## 2018-02-10 LAB — PHOSPHORUS: Phosphorus: 3 mg/dL (ref 2.3–4.6)

## 2018-02-10 LAB — MAGNESIUM: Magnesium: 1.7 mg/dL (ref 1.5–2.5)

## 2018-02-13 ENCOUNTER — Ambulatory Visit (INDEPENDENT_AMBULATORY_CARE_PROVIDER_SITE_OTHER): Payer: BLUE CROSS/BLUE SHIELD | Admitting: Adult Health

## 2018-02-13 ENCOUNTER — Telehealth: Payer: Self-pay | Admitting: Adult Health

## 2018-02-13 ENCOUNTER — Encounter (INDEPENDENT_AMBULATORY_CARE_PROVIDER_SITE_OTHER): Payer: Self-pay | Admitting: Family Medicine

## 2018-02-13 ENCOUNTER — Ambulatory Visit (INDEPENDENT_AMBULATORY_CARE_PROVIDER_SITE_OTHER): Payer: BLUE CROSS/BLUE SHIELD | Admitting: Family Medicine

## 2018-02-13 ENCOUNTER — Telehealth: Payer: Self-pay | Admitting: Internal Medicine

## 2018-02-13 ENCOUNTER — Encounter: Payer: Self-pay | Admitting: Adult Health

## 2018-02-13 VITALS — BP 122/80 | HR 82 | Temp 97.4°F | Ht 62.0 in | Wt 221.0 lb

## 2018-02-13 DIAGNOSIS — Z6841 Body Mass Index (BMI) 40.0 and over, adult: Secondary | ICD-10-CM

## 2018-02-13 DIAGNOSIS — E559 Vitamin D deficiency, unspecified: Secondary | ICD-10-CM

## 2018-02-13 DIAGNOSIS — J9621 Acute and chronic respiratory failure with hypoxia: Secondary | ICD-10-CM

## 2018-02-13 DIAGNOSIS — Z0289 Encounter for other administrative examinations: Secondary | ICD-10-CM

## 2018-02-13 DIAGNOSIS — Z9189 Other specified personal risk factors, not elsewhere classified: Secondary | ICD-10-CM | POA: Diagnosis not present

## 2018-02-13 DIAGNOSIS — J849 Interstitial pulmonary disease, unspecified: Secondary | ICD-10-CM

## 2018-02-13 DIAGNOSIS — Z1331 Encounter for screening for depression: Secondary | ICD-10-CM

## 2018-02-13 DIAGNOSIS — E119 Type 2 diabetes mellitus without complications: Secondary | ICD-10-CM

## 2018-02-13 DIAGNOSIS — E038 Other specified hypothyroidism: Secondary | ICD-10-CM

## 2018-02-13 DIAGNOSIS — R0602 Shortness of breath: Secondary | ICD-10-CM

## 2018-02-13 DIAGNOSIS — R5383 Other fatigue: Secondary | ICD-10-CM | POA: Diagnosis not present

## 2018-02-13 DIAGNOSIS — E538 Deficiency of other specified B group vitamins: Secondary | ICD-10-CM

## 2018-02-13 NOTE — Assessment & Plan Note (Signed)
Continue with weight loss.

## 2018-02-13 NOTE — Progress Notes (Deleted)
Office: [phone]  /  Fax: [phone]   HPI:   Chief Complaint: OBESITY Aline is here to discuss her progress with her obesity treatment plan. She is on the  {MWMwtlossportion/plan#2:210800006} and is following her eating plan approximately *** % of the time. She states she is exercising *** minutes *** times per week. Waylon is currently struggling with ***.  Her weight is 221 lb (100.2 kg) today and {misc; weight loss:12477} since her last visit. She has lost *** lbs since starting treatment with [name].  ASSESSMENT AND PLAN:  Other fatigue - Plan: EKG 12-Lead, CBC With Differential  Shortness of breath on exertion - Plan: CBC With Differential, Lipid Panel With LDL/HDL Ratio  Type 2 diabetes mellitus without complication, without long-term current use of insulin (HCC) - Plan: Microalbumin / creatinine urine ratio, Comprehensive metabolic panel, Hemoglobin A1c, Insulin, random  Other specified hypothyroidism - Plan: T3, T4, free, TSH  ILD (interstitial lung disease) ([place]) - Plan: CBC With Differential  Vitamin D deficiency - Plan: VITAMIN D 25 Hydroxy (Vit-D Deficiency, Fractures)  B12 nutritional deficiency - Plan: Vitamin B12, Folate  Depression screening  At risk for heart disease  Class 3 severe obesity with serious comorbidity and body mass index (BMI) of 40.0 to 44.9 in adult, unspecified obesity type (HCC)  PLAN:  Obesity Larri {CHL AMB IS/IS NOT:210130109} currently in the action stage of change. As such, her goal is to {MWMwtloss#1:210800005} She has agreed to {MWMwtlossportion/plan#2:210800006} Agata has been instructed to work up to a goal of 150 minutes of combined cardio and strengthening exercise per week for weight loss and overall health benefits. We discussed the following Behavioral Modification Stratagies today: {MWMwtlossdietstrategies#3:210800007} We discussed various medication options to help Kazoua with her weight loss efforts  and we both agreed to [employer] has agreed to follow up with our clinic in {NUMBER 1-10:22536} weeks. She was informed of the importance of frequent follow up visits to maximize her success with intensive lifestyle modifications for her multiple health conditions.  ALLERGIES: Allergies  Allergen Reactions  . Macrobid [Nitrofurantoin Macrocrystal] Nausea And Vomiting  . Adhesive [Tape] Rash    MEDICATIONS: Current Outpatient Medications on File Prior to Visit  Medication Sig Dispense Refill  . apixaban (ELIQUIS) 5 MG TABS tablet Take 5 mg by mouth 2 (two) times daily.    . benzonatate (TESSALON) 100 MG capsule Take 100 mg by mouth 3 (three) times daily as needed for cough.     . carvedilol (COREG) 6.25 MG tablet Take 6.25 mg by mouth 2 (two) times daily with a meal.    . cholecalciferol (VITAMIN D) 25 MCG (1000 UT) tablet Take 1,000 Units by mouth daily.    . furosemide (LASIX) 40 MG tablet Take 40 mg by mouth See admin instructions. Take 1 tablet (40 mg) daily, may repeat dose if needed for fluid retention.    . metFORMIN (GLUCOPHAGE) 500 MG tablet Take 500 mg by mouth 2 (two) times daily.     . montelukast (SINGULAIR) 10 MG tablet Take 10 mg by mouth daily after supper.    Marland Kitchen OFEV 150 MG CAPS Take 150 mg by mouth 2 (two) times daily.  11  . ondansetron (ZOFRAN) 8 MG tablet Take 1 tablet (8 mg total) by mouth every 8 (eight) hours as needed for nausea or vomiting. 45 tablet 3  . potassium chloride (K-DUR) 10 MEQ tablet Take 10 mEq by mouth 2 (two) times daily.   0  . predniSONE (DELTASONE) 10  MG tablet Take 10-66m as needed for SOB (Patient taking differently: Take 10 mg by mouth daily with breakfast. ) 30 tablet 3  . PROAIR HFA 108 (90 Base) MCG/ACT inhaler Inhale 2 puffs into the lungs every 6 (six) hours as needed for wheezing or shortness of breath.  4  . spironolactone (ALDACTONE) 100 MG tablet Take 100 mg by mouth daily.    .Marland Kitchensulfamethoxazole-trimethoprim (BACTRIM) 400-80 MG  tablet Once a day while on prednisone (Patient taking differently: Take 1 tablet by mouth every evening. ) 90 tablet 1  . thyroid (ARMOUR) 90 MG tablet Take 90 mg by mouth daily before breakfast.      No current facility-administered medications on file prior to visit.     PAST MEDICAL HISTORY: Past Medical History:  Diagnosis Date  . [place]neurysm ([place]   . Back pain   . Diabetes mellitus without complication ([place]   . H/O blood clots   . Hypertension   . Joint pain   . Pulmonary fibrosis ([place]   . SOB (shortness of breath)   . Swelling   . Thyroid disease   . Vitamin D deficiency     PAST SURGICAL HISTORY: Past Surgical History:  Procedure Laterality Date  . ABLATION    . IR IMAGING GUIDED PORT INSERTION  01/26/2018  . KNEE SURGERY    . LUNG BIOPSY      SOCIAL HISTORY: Social History   Tobacco Use  . Smoking status: Never Smoker  . Smokeless tobacco: Never Used  Substance Use Topics  . Alcohol use: Not Currently  . Drug use: Never    FAMILY HISTORY: Family History  Problem Relation Age of Onset  . Heart failure Mother   . Heart disease Mother   . Rheumatologic disease Father     ROS: ROS  PHYSICAL EXAM: Blood pressure 122/80, pulse 82, temperature (!) 97.4 F (36.3 C), temperature source Oral, height _0  (1.575 m), weight 221 lb (100.2 kg), SpO2 100 %. Body mass index is 40.42 kg/m. Physical Exam  RECENT LABS AND TESTS: BMET    Component Value Date/Time   NA 138 02/10/2018 1449   K 3.7 02/10/2018 1449   CL 90 (L) 02/10/2018 1449   CO2 31 02/10/2018 1449   GLUCOSE 237 (H) 02/10/2018 1449   BUN 12 02/10/2018 1449   CREATININE 0.82 02/10/2018 1449   CALCIUM 9.2 02/10/2018 1449   GFRNONAA >60 02/03/2018 1055   GFRAA >60 02/03/2018 1055   No results found for: HGBA1C No results found for: INSULIN CBC    Component Value Date/Time   WBC 6.5 02/10/2018 1449   RBC 4.43 02/10/2018 1449   HGB 11.1 (L) 02/10/2018 1449   HCT 35.6 (L) 02/10/2018 1449    PLT 291.0 02/10/2018 1449   MCV 80.3 02/10/2018 1449   MCH 24.9 (L) 02/03/2018 1055   MCHC 31.1 02/10/2018 1449   RDW 16.2 (H) 02/10/2018 1449   LYMPHSABS 0.3 (L) 02/10/2018 1449   MONOABS 0.3 02/10/2018 1449   EOSABS 0.0 02/10/2018 1449   BASOSABS 0.1 02/10/2018 1449   Iron/TIBC/Ferritin/ %Sat No results found for: IRON, TIBC, FERRITIN, IRONPCTSAT Lipid Panel  No results found for: CHOL, TRIG, HDL, CHOLHDL, VLDL, LDLCALC, LDLDIRECT Hepatic Function Panel     Component Value Date/Time   PROT 6.7 02/10/2018 1449   ALBUMIN 4.0 02/10/2018 1449   AST 17 02/10/2018 1449   ALT 20 02/10/2018 1449   ALKPHOS 81 02/10/2018 1449   BILITOT 0.3 02/10/2018 1449  BILIDIR 0.0 02/10/2018 1449   No results found for: TSH    OBESITY BEHAVIORAL INTERVENTION VISIT  Today's visit was # {Numbers; 5-84:83507}   Starting weight: *** Starting date: *** Today's weight : Weight: 221 lb (100.2 kg)  Today's date: 02/13/2018 Total lbs lost to date: *** At least 15 minutes were spent on discussing the following behavioral intervention visit.   ASK: We discussed the diagnosis of obesity with [name] today and Mora agreed to give [name] permission to discuss obesity behavioral modification therapy today.  ASSESS: Wendi has the diagnosis of obesity and her BMI today is _0 @ [place] {ACTION; IS/IS DPB:22567209} in the action stage of change   ADVISE: Kniyah was educated on the multiple health risks of obesity as well as the benefit of weight loss to improve her health. She was advised of the need for long term treatment and the importance of lifestyle modifications to improve her current health and to decrease her risk of future health problems.  AGREE: Multiple dietary modification options and treatment options were discussed and  Dustee agreed to follow the recommendations documented in the above note.  ARRANGE: Patte was educated on the importance of frequent  visits to treat obesity as outlined per CMS and USPSTF guidelines and agreed to schedule her next follow up appointment today.  I, ***, am acting as transcriptionist for ***

## 2018-02-13 NOTE — Assessment & Plan Note (Signed)
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  Continue to follow lab work closely.  She has plans for repeat Cytoxan infusion in 3 to 4 weeks from her most recent one.  Plan  Patient Instructions  Glad you are doing better  Continue on Oxygen , goal to keep O2 sats >90%.  Continue on OFEV .  Follow up as planned , will set up for next IV Cytoxan infusion.  Please contact office for sooner follow up if symptoms do not improve or worsen or seek emergency care

## 2018-02-13 NOTE — Progress Notes (Signed)
_0  ID: Tasha Ballard, female    DOB: [birth year], 47 y.o.   MRN: [id number]  Chief Complaint  Patient presents with  . Follow-up    Referring provider: [name], MD  HPI: 47 year old female never smoker seen for pulmonary consult November 22, 2017 to establish for evaluation of interstitial lung disease.  She has been followed at [facility].  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  At that time she was placed on CellCept.  In 2016 had a clinical decline.  And started on oxygen at 2 L.  In 2016.  Since this time patient has had a progressive decline with increased oxygen demands.  She is currently on 6-8 L at rest and 15 L with activity.  Received Rituxan infusions x2 in April and May 2019.  Patient was seen at [facility] Dr. Threasa Ballard.  In September 2019.  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 Since this surgery has been progressively getting worse.   TEST/EVENTS :  Investigations at [facility] -November 09, 2017: Echocardiogram ejection fraction 70-75% with grade 2 diastolic dysfunction. Right ventricle was mildly enlarged but global RV function was felt to be normal. No pericardial effusion no obvious right-to-left shunt at rest or cough or Valsalva on agitated saline contrast exam and stage I right ventricular diastolic dysfunction. Pulmonary artery systolic pressure at 45 mmHg  -Lung function November 08, 2017: FVC 0.9 L / 27%, DLCO 5.86/29%  - HRCT 11/08/17 Rt diaphragm elevation ? Eventration v paresis.  Esophagogram November 11, 2017 - > 11/11/17 Tasha Ballard - Normal  Dr. Threasa [facility] at [facility].She was started on cytoxan 85m per day 12/23/17 and increaed to 1056mon 12/23/17.On11/19/19. increase to 12571mer day .   02/13/2018 Follow up : Progressive NSIP , O2 RF    Patient returns for a one-week follow-up.  Patient was recently admitted for IV Cytoxan under observation status.. Patient had been on oral Cytoxan.  Continue to have progressive worsening of her ILD.  At baseline she is on 4 to 6 L at rest.  And 10 to 15 L with activity.  She is on Aldactone and Lasix.  She has been started on OFEV 3 weeks ago.  She is tolerating well with no nausea vomiting or diarrhea.  Does have some mild constipation. Since her Cytoxan infusion.  She says she has done well.  She had minimal nausea.  She does feel that her breathing has slightly improved.  Her oxygen demands are decreased.  She is using mainly only 6 L of oxygen.  And doing well with this with good O2 saturations. Says she was able to actually go out to dinner for the first time in a long time.  She denies any hematuria, chest pain, orthopnea or increased leg swelling.  She is working on weight loss.  She is going to the weight loss center. Labs were done on 12/20 that showed normal Tasha Ballard blood cell count, platelets and kidney function. Urinalysis was done today.  And is pending.. Plans are for a Cytoxan infusion in 3- 4 weeks from her last infusion.  Allergies  Allergen Reactions  . Macrobid [Nitrofurantoin Macrocrystal] Nausea And Vomiting  . Adhesive [Tape] Rash    Immunization History  Administered Date(s) Administered  . Influenza,inj,Quad PF,6+ Mos 12/24/2014, 02/05/2016, 01/07/2017, 11/22/2017  . Pneumococcal Conjugate-13 07/20/2017  . Pneumococcal Polysaccharide-23 06/09/2012  Past Medical History:  Diagnosis Date  . Aneurysm ([place])   . Back pain   . Diabetes mellitus without complication ([place])   . H/O blood clots   . Hypertension   . Joint pain   . Pulmonary fibrosis ([place])   . SOB (shortness of breath)   . Swelling   . Thyroid disease   . Vitamin D deficiency     Tobacco History: Social History   Tobacco Use  Smoking Status Never Smoker  Smokeless Tobacco Never Used    Counseling given: Not Answered   Outpatient Medications Prior to Visit  Medication Sig Dispense Refill  . apixaban (ELIQUIS) 5 MG TABS tablet Take 5 mg by mouth 2 (two) times daily.    . benzonatate (TESSALON) 100 MG capsule Take 100 mg by mouth 3 (three) times daily as needed for cough.     . carvedilol (COREG) 6.25 MG tablet Take 6.25 mg by mouth 2 (two) times daily with a meal.    . cholecalciferol (VITAMIN D) 25 MCG (1000 UT) tablet Take 1,000 Units by mouth daily.    . furosemide (LASIX) 40 MG tablet Take 40 mg by mouth See admin instructions. Take 1 tablet (40 mg) daily, may repeat dose if needed for fluid retention.    . metFORMIN (GLUCOPHAGE) 500 MG tablet Take 500 mg by mouth 2 (two) times daily.     . montelukast (SINGULAIR) 10 MG tablet Take 10 mg by mouth daily after supper.    Marland Kitchen OFEV 150 MG CAPS Take 150 mg by mouth 2 (two) times daily.  11  . ondansetron (ZOFRAN) 8 MG tablet Take 1 tablet (8 mg total) by mouth every 8 (eight) hours as needed for nausea or vomiting. 45 tablet 3  . potassium chloride (K-DUR) 10 MEQ tablet Take 10 mEq by mouth 2 (two) times daily.   0  . predniSONE (DELTASONE) 10 MG tablet Take 10-97m as needed for SOB (Patient taking differently: Take 10 mg by mouth daily with breakfast. ) 30 tablet 3  . PROAIR HFA 108 (90 Base) MCG/ACT inhaler Inhale 2 puffs into the lungs every 6 (six) hours as needed for wheezing or shortness of breath.  4  . spironolactone (ALDACTONE) 100 MG tablet Take 100 mg by mouth daily.    .Marland Kitchensulfamethoxazole-trimethoprim (BACTRIM) 400-80 MG tablet Once a day while on prednisone (Patient taking differently: Take 1 tablet by mouth every evening. ) 90 tablet 1  . thyroid (ARMOUR) 90 MG tablet Take 90 mg by mouth daily before breakfast.      No facility-administered medications prior to visit.      Review of Systems  Constitutional:   No  weight loss, night sweats,  Fevers, chills, + fatigue, or  lassitude.  HEENT:   No headaches,   Difficulty swallowing,  Tooth/dental problems, or  Sore throat,                No sneezing, itching, ear ache, nasal congestion, post nasal drip,   CV:  No chest pain,  Orthopnea, PND, swelling in lower extremities, anasarca, dizziness, palpitations, syncope.   GI  No heartburn, indigestion, abdominal pain, nausea, vomiting, diarrhea, change in bowel habits, loss of appetite, bloody stools.   Resp:   No chest wall deformity  Skin: no rash or lesions.  GU: no dysuria, change in color of urine, no urgency or frequency.  No flank pain, no hematuria   MS:  No joint pain or swelling.  No decreased range of  motion.  No back pain.    Physical Exam  BP 124/70 (BP Location: Left Arm, Cuff Size: Large)   Pulse 88   Ht _0  (1.575 m)   Wt 224 lb (101.6 kg)   SpO2 96%   BMI 40.97 kg/m   GEN: A/Ox3; pleasant , NAD, chronically ill-appearing on oxygen   HEENT:  [place]/AT,  EACs-clear, TMs-wnl, NOSE-clear, THROAT-clear, no lesions, no postnasal drip or exudate noted.   NECK:  Supple w/ fair ROM; no JVD; normal carotid impulses w/o bruits; no thyromegaly or nodules palpated; no lymphadenopathy.    RESP  BB Crackles  no accessory muscle use, no dullness to percussion  CARD:  RRR, no m/r/g, tr peripheral edema, pulses intact, no cyanosis or clubbing.  GI:   Soft & nt; nml bowel sounds; no organomegaly or masses detected.   Musco: Warm bil, no deformities or joint swelling noted.   Neuro: alert, no focal deficits noted.    Skin: Warm, no lesions or rashes    Lab Results:  CBC    Component Value Date/Time   WBC 6.5 02/10/2018 1449   RBC 4.43 02/10/2018 1449   HGB 11.1 (L) 02/10/2018 1449   HCT 35.6 (L) 02/10/2018 1449   PLT 291.0 02/10/2018 1449   MCV 80.3 02/10/2018 1449   MCH 24.9 (L) 02/03/2018 1055   MCHC 31.1 02/10/2018 1449   RDW 16.2 (H) 02/10/2018 1449   LYMPHSABS 0.3 (L) 02/10/2018 1449   MONOABS 0.3 02/10/2018 1449   EOSABS 0.0 02/10/2018 1449   BASOSABS 0.1  02/10/2018 1449    BMET    Component Value Date/Time   NA 138 02/10/2018 1449   K 3.7 02/10/2018 1449   CL 90 (L) 02/10/2018 1449   CO2 31 02/10/2018 1449   GLUCOSE 237 (H) 02/10/2018 1449   BUN 12 02/10/2018 1449   CREATININE 0.82 02/10/2018 1449   CALCIUM 9.2 02/10/2018 1449   GFRNONAA >60 02/03/2018 1055   GFRAA >60 02/03/2018 1055    BNP No results found for: BNP  ProBNP No results found for: PROBNP  Imaging: Ir Imaging Guided Port Insertion  Result Date: 01/26/2018 CLINICAL DATA:  IPF, Chronic respiratory disease, needs durable venous access. Anticipated pulmonary transplant. EXAM: TUNNELED PORT CATHETER PLACEMENT WITH ULTRASOUND AND FLUOROSCOPIC GUIDANCE FLUOROSCOPY TIME:  0.1 minute; 7  uGym2 DAP ANESTHESIA/SEDATION: Intravenous Fentanyl and Versed were administered as conscious sedation during continuous monitoring of the patient's level of consciousness and physiological / cardiorespiratory status by the radiology RN, with a total moderate sedation time of 17 minutes. TECHNIQUE: The procedure, risks, benefits, and alternatives were explained to the patient. Questions regarding the procedure were encouraged and answered. The patient understands and consents to the procedure. As antibiotic prophylaxis, cefazolin 2 g was ordered pre-procedure and administered intravenously within one hour of incision. Patency of the right IJ vein was confirmed with ultrasound with image documentation. An appropriate skin site was determined. Skin site was marked. Region was prepped using maximum barrier technique including cap and mask, sterile gown, sterile gloves, large sterile sheet, and Chlorhexidine as cutaneous antisepsis. The region was infiltrated locally with 1% lidocaine. Under real-time ultrasound guidance, the right IJ vein was accessed with a 21 gauge micropuncture needle; the needle tip within the vein was confirmed with ultrasound image documentation. Needle was exchanged over a 018  guidewire for transitional dilator which allowed passage of the [facility] wire into the IVC. Over this, the transitional dilator was exchanged for a 5 [place] MPA catheter. A  small incision was made on the right anterior chest wall and a subcutaneous pocket fashioned. The power-injectable port was positioned and its catheter tunneled to the right IJ dermatotomy site. The MPA catheter was exchanged over an Amplatz wire for a peel-away sheath, through which the port catheter, which had been trimmed to the appropriate length, was advanced and positioned under fluoroscopy with its tip at the cavoatrial junction. Spot chest radiograph confirms good catheter position and no pneumothorax. The pocket was closed with deep interrupted and subcuticular continuous 3-0 Monocryl sutures. The port was flushed per protocol. The incisions were covered with Dermabond then covered with a sterile dressing. COMPLICATIONS: COMPLICATIONS None immediate IMPRESSION: Technically successful right IJ power-injectable port catheter placement. Ready for routine use. Electronically Signed   By: Lucrezia Europe M.D.   On: 01/26/2018 12:43      No flowsheet data found.  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  Continue to follow lab work closely.  She has plans for repeat Cytoxan infusion in 3 to 4 weeks from her most recent one.  Plan  Patient Instructions  Glad you are doing better  Continue on Oxygen , goal to keep O2 sats >90%.  Continue on OFEV .  Follow up as planned , will set up for next IV Cytoxan infusion.  Please contact office for sooner follow up if symptoms do not improve or worsen or seek emergency care       Acute on chronic respiratory failure with hypoxia (HCC) Improved oxygen demands.  Patient continue on O2 to keep O2 saturations greater than  88 to 90%.  Morbid obesity with BMI of 40.0-44.9, adult ([place]) Continue with weight loss.     Rexene Edison, NP 02/13/2018

## 2018-02-13 NOTE — Patient Instructions (Addendum)
Glad you are doing better  Continue on Oxygen , goal to keep O2 sats >90%.  Continue on OFEV .  Follow up as planned , will set up for next IV Cytoxan infusion.  Please contact office for sooner follow up if symptoms do not improve or worsen or seek emergency care

## 2018-02-13 NOTE — Telephone Encounter (Signed)
Patient was seen today by TP for follow up after receiving Cytoxan infusion Patient's next infusion is scheduled for 1.13.2020 at [facility]  Patient needs this to be rescheduled to Friday 1.10.2020 so that her spouse will be home in case she gets sick Last infusion patient did well on her 6L under observation at the hospital but we would like to do this at Short Stay if we can.  TP can do the orders on Thursday, patient be seen on Friday with the infusion after that visit If unable to be done at Short Stay, then we need to get her an observation bed at the hospital again  [facility] TCB x1 for Short Stay

## 2018-02-13 NOTE — Assessment & Plan Note (Signed)
Improved oxygen demands.  Patient continue on O2 to keep O2 saturations greater than 88 to 90%.

## 2018-02-13 NOTE — Telephone Encounter (Signed)
[name]  You are seeing Tasha Ballard 02/13/2018 for post cytoxan visit - her wbc is downtrend. You can check cbc again 02/13/2018. Also, she is questioning several followups  - after this visit 02/13/2018 . She can skip mine on 03/02/17 and see you on 03/03/17 with infusion 03/06/17  Thanks    SIGNATURE    Dr. Brand Males, M.D., F.C.C.P,  Pulmonary and Critical Care Medicine Staff Physician, [place] Director - Interstitial Lung Disease  Program  Pulmonary [place] at [place], [place], [address]  Pager: [phone], If no answer or between  15:00h - 7:00h: call [phone] Telephone: [phone]  8:48 AM 02/13/2018

## 2018-02-14 LAB — CBC WITH DIFFERENTIAL
Basophils Absolute: 0.1 10*3/uL (ref 0.0–0.2)
Basos: 1 %
EOS (ABSOLUTE): 0.2 10*3/uL (ref 0.0–0.4)
Eos: 3 %
HEMOGLOBIN: 11.1 g/dL (ref 11.1–15.9)
Hematocrit: 37.2 % (ref 34.0–46.6)
Immature Grans (Abs): 0 10*3/uL (ref 0.0–0.1)
Immature Granulocytes: 0 %
Lymphocytes Absolute: 0.8 10*3/uL (ref 0.7–3.1)
Lymphs: 13 %
MCH: 24 pg — ABNORMAL LOW (ref 26.6–33.0)
MCHC: 29.8 g/dL — AB (ref 31.5–35.7)
MCV: 81 fL (ref 79–97)
Monocytes Absolute: 0.6 10*3/uL (ref 0.1–0.9)
Monocytes: 11 %
Neutrophils Absolute: 4.2 10*3/uL (ref 1.4–7.0)
Neutrophils: 72 %
RBC: 4.62 x10E6/uL (ref 3.77–5.28)
RDW: 15.6 % — AB (ref 12.3–15.4)
WBC: 5.9 10*3/uL (ref 3.4–10.8)

## 2018-02-14 LAB — COMPREHENSIVE METABOLIC PANEL
ALT: 18 IU/L (ref 0–32)
AST: 14 IU/L (ref 0–40)
Albumin/Globulin Ratio: 2.3 — ABNORMAL HIGH (ref 1.2–2.2)
Albumin: 4.2 g/dL (ref 3.5–5.5)
Alkaline Phosphatase: 94 IU/L (ref 39–117)
BUN/Creatinine Ratio: 17 (ref 9–23)
BUN: 11 mg/dL (ref 6–24)
Bilirubin Total: 0.4 mg/dL (ref 0.0–1.2)
CO2: 28 mmol/L (ref 20–29)
Calcium: 9.3 mg/dL (ref 8.7–10.2)
Chloride: 87 mmol/L — ABNORMAL LOW (ref 96–106)
Creatinine, Ser: 0.66 mg/dL (ref 0.57–1.00)
GFR calc Af Amer: 120 mL/min/{1.73_m2} (ref >59)
GFR calc non Af Amer: 104 mL/min/{1.73_m2} (ref >59)
GLOBULIN, TOTAL: 1.8 g/dL (ref 1.5–4.5)
Glucose: 91 mg/dL (ref 65–99)
Potassium: 3.6 mmol/L (ref 3.5–5.2)
Sodium: 136 mmol/L (ref 134–144)
Total Protein: 6 g/dL (ref 6.0–8.5)

## 2018-02-14 LAB — T3: T3, Total: 185 ng/dL — ABNORMAL HIGH (ref 71–180)

## 2018-02-14 LAB — T4, FREE: Free T4: 0.89 ng/dL (ref 0.82–1.77)

## 2018-02-14 LAB — FOLATE: FOLATE: 10.1 ng/mL (ref >3.0)

## 2018-02-14 LAB — LIPID PANEL WITH LDL/HDL RATIO
Cholesterol, Total: 295 mg/dL — ABNORMAL HIGH (ref 100–199)
HDL: 85 mg/dL (ref >39)
LDL Calculated: 186 mg/dL — ABNORMAL HIGH (ref 0–99)
LDl/HDL Ratio: 2.2 ratio (ref 0.0–3.2)
Triglycerides: 120 mg/dL (ref 0–149)
VLDL Cholesterol Cal: 24 mg/dL (ref 5–40)

## 2018-02-14 LAB — VITAMIN B12: VITAMIN B 12: 376 pg/mL (ref 232–1245)

## 2018-02-14 LAB — MICROALBUMIN / CREATININE URINE RATIO
Creatinine, Urine: 69.5 mg/dL
Microalb/Creat Ratio: 6.2 mg/g creat (ref 0.0–30.0)
Microalbumin, Urine: 4.3 ug/mL

## 2018-02-14 LAB — INSULIN, RANDOM: INSULIN: 7.4 u[IU]/mL (ref 2.6–24.9)

## 2018-02-14 LAB — TSH: TSH: 1.34 u[IU]/mL (ref 0.450–4.500)

## 2018-02-14 LAB — VITAMIN D 25 HYDROXY (VIT D DEFICIENCY, FRACTURES): Vit D, 25-Hydroxy: 20.9 ng/mL — ABNORMAL LOW (ref 30.0–100.0)

## 2018-02-14 LAB — HEMOGLOBIN A1C
Est. average glucose Bld gHb Est-mCnc: 114 mg/dL
Hgb A1c MFr Bld: 5.6 % (ref 4.8–5.6)

## 2018-02-14 NOTE — Telephone Encounter (Signed)
Error

## 2018-02-14 NOTE — Telephone Encounter (Signed)
Tasha Ballard Short Stay, CB is [phone]. States we called [facility] Short Stay and this is not their patient so she cannot reschedule this.

## 2018-02-14 NOTE — Telephone Encounter (Signed)
Patient Cytoxan infusions are done at [facility] day care.  Called and spoke with [place].  Explained that the Patient is needing her appointment changed from 03/06/18 to 03/03/18.  Bonnita Nasuti stated that the person that schedules if off until Friday, 02/17/18.   Will hold message in triage until Friday for schedule change

## 2018-02-16 NOTE — Telephone Encounter (Signed)
Will address tomorrow on Friday 02/16/18

## 2018-02-16 NOTE — Telephone Encounter (Signed)
Will keep encounter open until Friday, 02/17/18 as this is the date for Friday not 02/16/18. That way we can discuss this with the scheduler to see if this could be done at [place] at [facility].

## 2018-02-17 ENCOUNTER — Other Ambulatory Visit (HOSPITAL_COMMUNITY): Payer: Self-pay | Admitting: General Practice

## 2018-02-17 NOTE — Telephone Encounter (Signed)
Called [facility] Stay [facility] at [facility] and spoke with Carlyon Shadow stating to her pt had first cytoxan infusion which was done with her being admitted for observation based on her condition. Stated to Carlyon Shadow that pt was told that pt was told after that first round of the cytoxan that it could be done at short stay and I wanted to make sure this was still correct. Carlyon Shadow was unsure about this so she had me speak with [facility].  When speaking with [facility], she stated to me that pt will be able to have the Cytoxan infusions at short stay at [facility] now.  I stated to [facility] that we were needing to have pt's infusion changed from the current scheduled date of 03/06/18 to 03/03/18.  Per [place], they are unable to change pt's Cytoxan infusion date and Karena Addison even stated to me that it was lucky that they were able to get pt on the schedule to have it performed 03/06/18.  Pt has been made aware that she will have to keep the date of 03/06/18 at Short Stay for her next Cytoxan infusion and she was fine with this.  When speaking with Freada Bergeron is asking for the orders to all be placed in epic prior to pt coming for the Cytoxan infusion so they will know all that is going to need to be done and I stated to [facility] that TP would be the one who will take care of this.  Pt does have an appt scheduled with TP 03/03/18. Does pt still need to keep this appt? Routing this to both Jess and [employer].

## 2018-02-19 NOTE — Progress Notes (Signed)
Discharge Progress Report  Patient Details  Name: Tasha Ballard MRN: [id number] Date of Birth: December 08, 1970 Referring Provider:     Pulmonary Rehab Walk Test from 08/11/2017 in [place]  Referring Provider  Dr. Rayann Heman ]       Number of Visits: 24 exercise sessions and 11 education classes    Reason for Discharge:  Patient reached a stable level of exercise. Patient independent in their exercise. Patient has met program and personal goals.  Smoking History:  Social History   Tobacco Use  Smoking Status Never Smoker  Smokeless Tobacco Never Used    Diagnosis:  ILD (interstitial lung disease) ([place])  ADL [facility]:   Initial Exercise Prescription:   Discharge Exercise Prescription (Final Exercise Prescription Changes): Exercise Prescription Changes - 12/20/17 1200      Response to Exercise   Blood Pressure (Admit)  124/70    Blood Pressure (Exercise)  118/70    Blood Pressure (Exit)  110/60    Heart Rate (Admit)  92 bpm    Heart Rate (Exercise)  108 bpm    Heart Rate (Exit)  90 bpm    Oxygen Saturation (Admit)  100 %    Oxygen Saturation (Exercise)  94 %    Oxygen Saturation (Exit)  99 %    Rating of Perceived Exertion (Exercise)  13    Perceived Dyspnea (Exercise)  2    Duration  Progress to 45 minutes of aerobic exercise without signs/symptoms of physical distress    Intensity  THRR unchanged      Progression   Progression  Continue to progress workloads to maintain intensity without signs/symptoms of physical distress.      Resistance Training   Training Prescription  Yes    Weight  blue bands    Reps  10-15    Time  10 Minutes      Interval Training   Interval Training  No      Oxygen   Oxygen  Continuous    Liters  15      Treadmill   MPH  1.5    Grade  0    Minutes  17      NuStep   Level  3    SPM  80    Minutes  34    METs  1.4       Functional Capacity: 6 Minute Walk    Row Name 12/22/17 1516          6 Minute Walk   Phase  Discharge     Distance  1069 feet     Distance Feet Change  269 ft     Walk Time  6 minutes     # of Rest Breaks  0     MPH  2.02     METS  2.53     RPE  13     Perceived Dyspnea   3     Symptoms  No     Resting HR  96 bpm     Resting BP  130/64     Resting Oxygen Saturation   100 %     Exercise Oxygen Saturation  during 6 min walk  87 %     Max Ex. HR  132 bpm     Max Ex. BP  146/78     2 Minute Post BP  122/78       Interval HR   1 Minute HR  118  2 Minute HR  120     3 Minute HR  126     4 Minute HR  130     5 Minute HR  131     6 Minute HR  132     2 Minute Post HR  102     Interval Heart Rate?  Yes       Interval Oxygen   Interval Oxygen?  Yes     Baseline Oxygen Saturation %  100 %     1 Minute Oxygen Saturation %  96 %     1 Minute Liters of Oxygen  15 L     2 Minute Oxygen Saturation %  89 %     2 Minute Liters of Oxygen  15 L     3 Minute Oxygen Saturation %  89 %     3 Minute Liters of Oxygen  15 L     4 Minute Oxygen Saturation %  88 %     4 Minute Liters of Oxygen  15 L     5 Minute Oxygen Saturation %  87 %     5 Minute Liters of Oxygen  15 L     6 Minute Oxygen Saturation %  87 %     6 Minute Liters of Oxygen  15 L     2 Minute Post Oxygen Saturation %  98 %     2 Minute Post Liters of Oxygen  15 L        Psychological, QOL, Others - Outcomes: PHQ 2/9: Depression screen PHQ 2/9 02/13/2018 12/22/2017 08/10/2017  Decreased Interest 3 2 3  Down, Depressed, Hopeless 1 0 1  PHQ - 2 Score 4 2 4  Altered sleeping 3 3 3  Tired, decreased energy 3 3 3  Change in appetite 2 3 3  Feeling bad or failure about yourself  1 1 3  Trouble concentrating 1 1 1  Moving slowly or fidgety/restless 1 0 0  Suicidal thoughts 0 0 0  PHQ-9 Score 15 13 17  Difficult doing work/chores Very difficult Somewhat difficult Very difficult    Quality of Life:   Personal Goals: Goals established at orientation with interventions  provided to work toward goal.    Personal Goals Discharge: Goals and Risk Factor Review    Row Name 09/12/17 1335 09/14/17 1147 10/11/17 1012 11/09/17 1645 12/07/17 1906     Core Components/Risk Factors/Patient Goals Review   Personal Goals Review  Weight Management/Obesity;Develop more efficient breathing techniques such as purse lipped breathing and diaphragmatic breathing and practicing self-pacing with activity.;Diabetes;Improve shortness of breath with ADL's;Increase knowledge of respiratory medications and ability to use respiratory devices properly.;Stress  -  Weight Management/Obesity;Develop more efficient breathing techniques such as purse lipped breathing and diaphragmatic breathing and practicing self-pacing with activity.;Improve shortness of breath with ADL's;Increase knowledge of respiratory medications and ability to use respiratory devices properly.;Stress  Weight Management/Obesity;Develop more efficient breathing techniques such as purse lipped breathing and diaphragmatic breathing and practicing self-pacing with activity.;Improve shortness of breath with ADL's;Increase knowledge of respiratory medications and ability to use respiratory devices properly.;Stress  Weight Management/Obesity;Develop more efficient breathing techniques such as purse lipped breathing and diaphragmatic breathing and practicing self-pacing with activity.;Improve shortness of breath with ADL's;Increase knowledge of respiratory medications and ability to use respiratory devices properly.;Stress   Review  Pt off to a great start.  Pt has completed 4 exercise sessions since 08/30/17.  Pt weight shows increase from 107.2 kg to   108.7 kg.  Pt has not engaged in home exerice on her days off due to deconditioning and fear of not knowing what she can do safely on her own.  Will plan to have the Exercise Physiologist talk with pt regarding home exercise.  Pt continue to feel stressed with her lung disease and the uncertainity  of what is to come.  Pt has attened oxygen safety class and demonstrates a good working knowledge of her pendulant cannula.  Pt observed having good PLB techniques.   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  Will resolve this as a patient goal.  Pt off to a great start.  Pt has completed 4 exercise sessions since 08/30/17.  Pt weight shows increase from 107.2 kg to 108.7 kg.  Pt has not engaged in home exerice on her days off due to deconditioning and fear of not knowing what she can do safely on her own.  Will plan to have the Exercise Physiologist talk with pt regarding home exercise.  Pt continue to feel stressed with her lung disease and the uncertainity of what is to come.  Pt has attened oxygen safety class and demonstrates a good working knowledge of her pendulant cannula.  Pt observed having good PLB techniques.   Pt workloads show arm crank level 2, nustep level 2 and track averages 8-10 laps. 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  Will resolve this as a patient goal.   Pt has completed 10 exercise sessions since 08/30/17.  Pt weight shows dcrease from 107.2 kg to 106.5kg.  Dietician to see her regarding weight loss. 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   EP met with pt on 8/1 to talk about home exercise. Pt continue to feel stressed with her lung disease and the uncertainity of what is to come particularly since she received the news she has 5 months.  Pt has attended meditation and mindfulness class.  Continue to support pt in her efforts. Pt observed having good PLB techniques.   Pt workloads show arm crank level 2, nustep level 4 and track averages 15 laps. 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  Will resolve this as a patient goal.   Pt has completed 17 exercise sessions since 08/30/17.  Pt weight shows decrease 4 kg.  Dietician to see her regarding weight loss and she is doing a high protein low carbohydrate diet. Pt knows she needs to lose weight in order to be considered for lung transplant.  This has been difficult for pt due to steriod therapy .  Pt is in [place] for second opinon and to see if she qualifies for clinical trials for ILD.  Pt observed having good PLB techniques.   Pt workloads show arm crank level 2, nustep level 4 and track averages 15 laps. Await pt return to exercise.  Continue to monitor pt progress during the next 30 day assessment.   Pt has completed 21 exercise sessions and 10 education classes since 08/30/17.  Pt weight shows decrease 3.2 kg which is slightly up from previous 4 kg loss.  Met with the dietician    regarding weight loss and she is now doing a high protein low carbohydrate diet. Pt knows she needs to lose weight in order to be considered for lung transplant.  This has been difficult for pt due to steriod therapy .  Pt has returned from [place] and feels optimistic about her future.  Pt out this week [place]  Pt observed having good PLB techniques and oximizer   Pt workloads show arm crank level increse to level 4, nustep level 2 and  now on the treadmill at 1.8. Continue to monitor pt progress during the next 30 day assessment.   Expected Outcomes  Pt will show progress toward weight loss so that she will be able to be eligible for placement for lung transplant.    Stressors will be  eliminated and/or  will report appropriate and healthy coping skills to deal with stressors.  pt will use proper teechnique of diaphragmatic breathing and PLB which will improve her shortness of breath  See Admission Goals/Outcomes  See Admission Goals/Outcomes  See Admission  Goals/Outcomes  See Admission Goals/Outcomes   Row Name 02/19/18 1551             Core Components/Risk Factors/Patient Goals Review   Review  Pt greaduates with the completion of 24 exercise sessions since 08/30/17.  Pt was planning to participate in exercise with the pulmonary rehab maintenance program.  However due to starting chemotherapy for her lung disease she is unable to safely participate in group setting due to immune system comprised.       Expected Outcomes  [name] made significant progress toward Program Goals and Outcomes          Exercise Goals and Review:   Exercise Goals Re-Evaluation: Exercise Goals Re-Evaluation    Row Name 09/12/17 0913 10/10/17 0914 11/07/17 1643 12/06/17 0955       Exercise Goal Re-Evaluation   Exercise Goals Review  Increase Physical Activity;Able to understand and use rate of perceived exertion (RPE) scale;Knowledge and understanding of Target Heart Rate Range (THRR);Understanding of Exercise Prescription;Increase Strength and Stamina;Able to understand and use Dyspnea scale  Increase Physical Activity;Able to understand and use rate of perceived exertion (RPE) scale;Knowledge and understanding of Target Heart Rate Range (THRR);Understanding of Exercise Prescription;Increase Strength and Stamina;Able to understand and use Dyspnea scale  Increase Physical Activity;Able to understand and use rate of perceived exertion (RPE) scale;Knowledge and understanding of Target Heart Rate Range (THRR);Understanding of Exercise Prescription;Increase Strength and Stamina;Able to understand and use Dyspnea scale  Increase Physical Activity;Able to understand and use rate of perceived exertion (RPE) scale;Knowledge and understanding of Target Heart Rate Range (THRR);Understanding of Exercise Prescription;Increase Strength and Stamina;Able to understand and use Dyspnea scale    Comments  Patient has only attended 4 rehab sessions. Will cont. to monitor and progress as  able.   Patient's progress is variable. Some days are better than others. She is able to walk on average 7-8 laps (200 ft each) in 15 minutes. Barriers have included sinus issues, high flow oxygen needs. Will cont to monitor and progress as able.   Patient's progress is variable. Some days are better than others. She is able to walk on average 7-8 laps (200 ft each) in 15 minutes. Barriers have included sinus issues, high flow oxygen needs. Is out of town at this point in search of second opinions. Will cont to monitor and progress as able.   Patient's progress is variable. Some  days are better than others. She walks on the treadmill at 1.5 speed with 0% incline. MET level averages 1.4 which places her in a low level.  Barriers have included sinus issues, high flow oxygen needs. Has had recent fall and will be out until she heals. Will cont to monitor and progress as able.     Expected Outcomes  Through exercise at rehab and at home, the patient will decrease shortness of breath with daily activities and feel confident in carrying out an exercise regime at home.   Through exercise at rehab and at home, the patient will decrease shortness of breath with daily activities and feel confident in carrying out an exercise regime at home.   Through exercise at rehab and at home, the patient will decrease shortness of breath with daily activities and feel confident in carrying out an exercise regime at home.   Through exercise at rehab and at home, the patient will decrease shortness of breath with daily activities and feel confident in carrying out an exercise regime at home.        Nutrition & Weight - Outcomes:    Nutrition: Nutrition Therapy & Goals - 01/16/18 1130      Nutrition Therapy   Diet  general healthful      Personal Nutrition Goals   Nutrition Goal  Identify food quantities necessary to achieve wt loss of  -2# per week to a goal wt loss of 6-24 lb at graduation from pulmonary rehab.       Intervention Plan   Intervention  Prescribe, educate and counsel regarding individualized specific dietary modifications aiming towards targeted core components such as weight, hypertension, lipid management, diabetes, heart failure and other comorbidities.    Expected Outcomes  Short Term Goal: Understand basic principles of dietary content, such as calories, fat, sodium, cholesterol and nutrients.       Nutrition Discharge: Nutrition Assessments - 01/16/18 1142      Rate Your Plate Scores   Pre Score  56    Post Score  38       Education Questionnaire Score:   Goals reviewed with patient. Cherre Huger, BSN Cardiac and Training and development officer

## 2018-02-19 NOTE — Addendum Note (Signed)
Encounter addended by: Rowe Pavy, RN on: 02/19/2018 4:05 PM  Actions taken: Episode resolved, Flowsheet data copied forward, Visit Navigator Flowsheet section accepted, Clinical Note Signed

## 2018-02-20 ENCOUNTER — Other Ambulatory Visit (HOSPITAL_COMMUNITY): Payer: Self-pay | Admitting: General Practice

## 2018-02-20 NOTE — Telephone Encounter (Signed)
Per TP: noted - thank you so much for your hard work on this!

## 2018-02-23 ENCOUNTER — Telehealth: Payer: Self-pay | Admitting: Internal Medicine

## 2018-02-23 NOTE — Telephone Encounter (Signed)
Call made to patient, patient states she is trying to find out if she needs to get lab work done next week. She was told she should be doing lab work every 10 days once she started chemo. She states MR and [facility] are familiar with her and would know what she is talking about. Patient aware TP will be back on Monday.   TP pleased advise as MR is on vacation.

## 2018-02-27 ENCOUNTER — Telehealth: Payer: Self-pay | Admitting: Internal Medicine

## 2018-02-27 NOTE — Progress Notes (Addendum)
Office: [phone]  /  Fax: [phone]   Dear Dr. Legrand Como B. Wert,   Thank you for referring Tasha Ballard to our clinic. The following note includes my evaluation and treatment recommendations.  HPI:   Chief Complaint: [place] has been referred by Dr. Legrand Como B. Wert for consultation regarding her obesity and obesity related comorbidities.    Tasha Ballard (MR# [id number]) is a 48 y.o. female who presents on 02/13/2018 for obesity evaluation and treatment. Current BMI is Body mass index is 40.42 kg/m. Tasha Ballard has been struggling with her weight for many years and has been unsuccessful in either losing weight, maintaining weight loss, or reaching her healthy weight goal.         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 A program in [place] can work with her, but she needs her BMI below 35 to have her transplant. She often skips meals and is a picky eater. She has done questionable things trying to lose weight, such as B12 shots and supplements at [facility].     Tasha Ballard attended our information session and states she is currently in the action stage of change and ready to dedicate time achieving and maintaining a healthier weight. Tasha Ballard is interested in becoming our patient and working on intensive lifestyle modifications including (but not limited to) diet, exercise and weight loss.  Tasha Ballard states she thinks her family will eat healthier with  her her desired weight loss is 71 lbs she has been heavy most of her life she started gaining weight with steroids 6 years ago her heaviest weight ever was 239 lbs. she is a picky eater and doesn't like to eat healthier foods  she has significant food cravings issues  she snacks frequently in the evenings she skips meals frequently, usually breakfast she is frequently drinking liquids with calories she frequently makes poor food choices she frequently eats larger  portions than normal  she has binge eating behaviors she struggles with emotional eating    Fatigue Tasha Ballard feels her energy is lower than it should be.This has worsened with weight gain and has not worsened recently. Tasha Ballard admits to daytime somnolence and admits to waking up still tired. Patient is at risk for obstructive sleep apnea. Patent has a history of symptoms of daytime fatigue and morning headache. Patient generally gets 6 hours of sleep per night, and states they generally have restless sleep. Snoring is present. Apneic episodes are present. Epworth Sleepiness Score is 9.  Dyspnea on exertion Tasha Ballard notes increasing shortness of breath with exercising, due to pulmonary fibrosis and seems to be worsening over time with weight gain. She notes getting out of breath sooner with activity than she used to. This has not gotten worse recently. Tasha Ballard denies orthopnea.  Diabetes II Tasha Ballard has a diagnosis of diabetes type II. Tasha Ballard is on metformin 532m and chronic steroids. She states that her last A1c was 5.7.   At risk for cardiovascular disease CSherrikais at a higher than average risk for cardiovascular disease due to diabetes and obesity. She currently denies any chest pain.  Hypothyroid CDivinehas a diagnosis of hypothyroidism. She is on Armour thyroid 927m but is not on Synthroid. She admits fatigue, but this may be due to multiple causes.  Interstitial Lung Disease with a history of Pulmonary Fibrosis Tasha Ballard's diagnosis of interstitial lung disease was found by her pulmonologist. She needs her BMI to be below 35 to get  on the transplant list. She is on prednisone 60m every day.  Vitamin D deficiency CFabianahas a diagnosis of vitamin D deficiency. She is currently taking vit D 1,000 units and does not have recent labs. She admits fatigue and denies nausea, vomiting, or muscle weakness.  Vitamin B12 deficiency CHertahas a diagnosis of B12  insufficiency and notes fatigue. This is not a new diagnosis and she has been on B12 injections. CCalaisis not a vegetarian and does not have a previous diagnosis of pernicious anemia. She does not have a history of weight loss surgery. She most likely does not have a true B12 deficiency, but does not have any recent labs.  Depression Screen Tasha Ballard's Food and Mood (modified PHQ-9) score was 15. Depression screen PHQ 2/9 02/13/2018  Decreased Interest 3  Down, Depressed, Hopeless 1  PHQ - 2 Score 4  Altered sleeping 3  Tired, decreased energy 3  Change in appetite 2  Feeling bad or failure about yourself  1  Trouble concentrating 1  Moving slowly or fidgety/restless 1  Suicidal thoughts 0  PHQ-9 Score 15  Difficult doing work/chores Very difficult    ASSESSMENT AND PLAN:  Other fatigue - Plan: EKG 12-Lead, CBC With Differential  Shortness of breath on exertion - Plan: CBC With Differential, Lipid Panel With LDL/HDL Ratio  Type 2 diabetes mellitus without complication, without long-term current use of insulin (HCC) - Plan: Microalbumin / creatinine urine ratio, Comprehensive metabolic panel, Hemoglobin A1c, Insulin, random  Other specified hypothyroidism - Plan: T3, T4, free, TSH  ILD (interstitial lung disease) (HErie - Plan: CBC With Differential  Vitamin D deficiency - Plan: VITAMIN D 25 Hydroxy (Vit-D Deficiency, Fractures)  B12 nutritional deficiency - Plan: Vitamin B12, Folate  Depression screening  At risk for heart disease  Class 3 severe obesity with serious comorbidity and body mass index (BMI) of 40.0 to 44.9 in adult, unspecified obesity type (HCC)  PLAN:  Fatigue CAngleswas informed that her fatigue may be related to obesity, depression or many other causes. Labs will be ordered, and in the meanwhile, CSequitahas agreed to work on diet, exercise and weight loss to help with fatigue. Proper sleep hygiene was discussed including the need for 7-8 hours of  quality sleep each night. A sleep study was not ordered based on symptoms and Epworth score. We will order an EKG and an indirect calorimetry today.   Dyspnea on exertion Tasha Ballard's shortness of breath appears to be obesity related and exercise induced. She has agreed to work on weight loss and gradually increase exercise to treat her exercise induced shortness of breath. If CShealynfollows our instructions and loses weight without improvement of her shortness of breath, we will plan to discuss with pulmonology. We will monitor this condition regularly. We will order labs, an indirect calorimetry, and an EKG today. CSheraewill follow up in 2 weeks and agrees to this plan.  Diabetes II CAsanihas been given extensive diabetes education by myself today including ideal fasting and post-prandial blood glucose readings, individual ideal Hgb A1c goals, and hypoglycemia prevention. We discussed the importance of good blood sugar control to decrease the likelihood of diabetic complications such as nephropathy, neuropathy, limb loss, blindness, coronary artery disease, and death. We discussed the importance of intensive lifestyle modification including diet, exercise and weight loss as the first line treatment for diabetes. We will check labs today. CAriannieagrees to start her diet, continue her diabetes medications and will check her blood sugar  2 times a day. She will follow up at the agreed upon time in 2 weeks.  Cardiovascular risk counseling Preslea was given extended (15 minutes) coronary artery disease prevention counseling today. She is 48 y.o. female and has risk factors for heart disease including diabetes and obesity. We discussed intensive lifestyle modifications today with an emphasis on specific weight loss instructions and strategies. Pt was also informed of the importance of increasing exercise and decreasing saturated fats to help prevent heart disease.  Hypothyroid Gayle was  informed of the importance of good thyroid control to help with weight loss efforts. She was also informed that supertherapeutic thyroid levels are dangerous and will not improve weight loss results. Labs will be ordered today and we will follow with her at her next appointment in 2 weeks. Reiko agrees with this plan.  Interstitial Lung Disease with a history of Pulmonary Fibrosis Katesha agrees to work on diet and weight loss with a BMI goal of 35. She agrees to follow up in 2 weeks.  Vitamin D Deficiency Baylei was informed that low vitamin D levels contributes to fatigue and are associated with obesity, breast, and colon cancer. She agrees to continue to take her Vit D _0 ,000 IU every day and will follow up for routine testing of vitamin D, at least 2-3 times per year. She was informed of the risk of over-replacement of vitamin D and agrees to not increase her dose unless she discusses this with [name] first. Labs were ordered today and she agrees to follow up in 2 weeks.  Depression Screen Levonne had a strongly positive depression screening. Depression is commonly associated with obesity and often results in emotional eating behaviors. We will monitor this closely and work on CBT to help improve the non-hunger eating patterns. Referral to Psychology may be required if no improvement is seen as she continues in our clinic.  Obesity Asaiah is currently in the action stage of change and her goal is to continue with weight loss efforts. I recommend Maysen begin the structured treatment plan as follows:  She has agreed to follow the Category 2 plan + 8 ounces of Fairlife skim milk. Eliyanah has been instructed to eventually work up to a goal of 150 minutes of combined cardio and strengthening exercise per week for weight loss and overall health benefits. We discussed the following Behavioral Modification Strategies today: increasing lean protein intake, decreasing simple carbohydrates,  and work on meal planning and easy cooking plans.  She was informed of the importance of frequent follow up visits to maximize her success with intensive lifestyle modifications for her multiple health conditions. She was informed we would discuss her lab results at her next visit unless there is a critical issue that needs to be addressed sooner. Courtland agreed to keep her next visit at the agreed upon time to discuss these results.  ALLERGIES: Allergies  Allergen Reactions  . Macrobid [Nitrofurantoin Macrocrystal] Nausea And Vomiting  . Adhesive [Tape] Rash    MEDICATIONS: Current Outpatient Medications on File Prior to Visit  Medication Sig Dispense Refill  . apixaban (ELIQUIS) 5 MG TABS tablet Take 5 mg by mouth 2 (two) times daily.    . benzonatate (TESSALON) 100 MG capsule Take 100 mg by mouth 3 (three) times daily as needed for cough.     . carvedilol (COREG) 6.25 MG tablet Take 6.25 mg by mouth 2 (two) times daily with a meal.    . cholecalciferol (VITAMIN D) 25 MCG (1000 UT) tablet Take  1,000 Units by mouth daily.    . furosemide (LASIX) 40 MG tablet Take 40 mg by mouth See admin instructions. Take 1 tablet (40 mg) daily, may repeat dose if needed for fluid retention.    . metFORMIN (GLUCOPHAGE) 500 MG tablet Take 500 mg by mouth 2 (two) times daily.     . montelukast (SINGULAIR) 10 MG tablet Take 10 mg by mouth daily after supper.    Marland Kitchen OFEV 150 MG CAPS Take 150 mg by mouth 2 (two) times daily.  11  . ondansetron (ZOFRAN) 8 MG tablet Take 1 tablet (8 mg total) by mouth every 8 (eight) hours as needed for nausea or vomiting. 45 tablet 3  . potassium chloride (K-DUR) 10 MEQ tablet Take 10 mEq by mouth 2 (two) times daily.   0  . predniSONE (DELTASONE) 10 MG tablet Take 10-6m as needed for SOB (Patient taking differently: Take 10 mg by mouth daily with breakfast. ) 30 tablet 3  . PROAIR HFA 108 (90 Base) MCG/ACT inhaler Inhale 2 puffs into the lungs every 6 (six) hours as needed  for wheezing or shortness of breath.  4  . spironolactone (ALDACTONE) 100 MG tablet Take 100 mg by mouth daily.    .Marland Kitchensulfamethoxazole-trimethoprim (BACTRIM) 400-80 MG tablet Once a day while on prednisone (Patient taking differently: Take 1 tablet by mouth every evening. ) 90 tablet 1  . thyroid (ARMOUR) 90 MG tablet Take 90 mg by mouth daily before breakfast.      No current facility-administered medications on file prior to visit.     PAST MEDICAL HISTORY: Past Medical History:  Diagnosis Date  . [place]neurysm ([place]   . Back pain   . Diabetes mellitus without complication ([place]   . H/O blood clots   . Hypertension   . Joint pain   . Pulmonary fibrosis ([place]   . SOB (shortness of breath)   . Swelling   . Thyroid disease   . Vitamin D deficiency     PAST SURGICAL HISTORY: Past Surgical History:  Procedure Laterality Date  . ABLATION    . IR IMAGING GUIDED PORT INSERTION  01/26/2018  . KNEE SURGERY    . LUNG BIOPSY      SOCIAL HISTORY: Social History   Tobacco Use  . Smoking status: Never Smoker  . Smokeless tobacco: Never Used  Substance Use Topics  . Alcohol use: Not Currently  . Drug use: Never    FAMILY HISTORY: Family History  Problem Relation Age of Onset  . Heart failure Mother   . Heart disease Mother   . Rheumatologic disease Father    ROS: Review of Systems  Constitutional: Positive for malaise/fatigue.  HENT: Positive for sinus pain.        Positive for stuffiness. Positive for discharge. Positive for hay fever. Positive for nosebleeds. Positive for dry mouth. Positive for stiffness.  Eyes:       Wears glasses or contacts. Positive for vision changes.  Respiratory: Positive for shortness of breath (with activity) and wheezing.        Difficulty breathing while lying down. Sudden awakening from sleep with shortness of breath. Negative for orthopnea.  Cardiovascular: Negative for chest pain.       Positive for calf / leg pain. Positive for leg  cramping. Positive for very cold hands and feet.  Gastrointestinal: Positive for constipation. Negative for nausea and vomiting.  Musculoskeletal: Positive for joint pain.       Positive for muscle pain.  Positive for muscles stiffness. Negative for muscle weakness.  Skin: Positive for itching.       Positive for dryness.  Neurological: Positive for weakness.  Endo/Heme/Allergies: Bruises/bleeds easily.  Psychiatric/Behavioral: The patient has insomnia.        Positive for stress.   PHYSICAL EXAM: Blood pressure 122/80, pulse 82, temperature (!) 97.4 F (36.3 C), temperature source Oral, height 5' 2" (1.575 m), weight 221 lb (100.2 kg), SpO2 100 %. Body mass index is 40.42 kg/m. Physical Exam Vitals signs reviewed.  Constitutional:      Appearance: Normal appearance. She is obese.  HENT:     Head: Normocephalic and atraumatic.     Nose: Nose normal.  Eyes:     General: No scleral icterus.    Extraocular Movements: Extraocular movements intact.  Neck:     Musculoskeletal: Normal range of motion and neck supple.     Comments: Negative for thyromegaly. Cardiovascular:     Rate and Rhythm: Normal rate and regular rhythm.  Pulmonary:     Effort: Pulmonary effort is normal. No respiratory distress.  Abdominal:     Palpations: Abdomen is soft.     Tenderness: There is no abdominal tenderness.     Comments: Positive for obesity.  Musculoskeletal:     Comments: ROM normal in all extremities.  Skin:    General: Skin is warm and dry.  Neurological:     Mental Status: She is alert and oriented to person, place, and time.     Coordination: Coordination normal.  Psychiatric:        Mood and Affect: Mood normal.        Behavior: Behavior normal.    RECENT LABS AND TESTS: BMET    Component Value Date/Time   NA 136 02/13/2018 1118   K 3.6 02/13/2018 1118   CL 87 (L) 02/13/2018 1118   CO2 28 02/13/2018 1118   GLUCOSE 91 02/13/2018 1118   GLUCOSE 237 (H) 02/10/2018 1449    BUN 11 02/13/2018 1118   CREATININE 0.66 02/13/2018 1118   CALCIUM 9.3 02/13/2018 1118   GFRNONAA 104 02/13/2018 1118   GFRAA 120 02/13/2018 1118   Lab Results  Component Value Date   HGBA1C 5.6 02/13/2018   Lab Results  Component Value Date   INSULIN 7.4 02/13/2018   CBC    Component Value Date/Time   WBC 5.9 02/13/2018 1118   WBC 6.5 02/10/2018 1449   RBC 4.62 02/13/2018 1118   RBC 4.43 02/10/2018 1449   HGB 11.1 02/13/2018 1118   HCT 37.2 02/13/2018 1118   PLT 291.0 02/10/2018 1449   MCV 81 02/13/2018 1118   MCH 24.0 (L) 02/13/2018 1118   MCH 24.9 (L) 02/03/2018 1055   MCHC 29.8 (L) 02/13/2018 1118   MCHC 31.1 02/10/2018 1449   RDW 15.6 (H) 02/13/2018 1118   LYMPHSABS 0.8 02/13/2018 1118   MONOABS 0.3 02/10/2018 1449   EOSABS 0.2 02/13/2018 1118   BASOSABS 0.1 02/13/2018 1118   Iron/TIBC/Ferritin/ %Sat No results found for: IRON, TIBC, FERRITIN, IRONPCTSAT Lipid Panel     Component Value Date/Time   CHOL 295 (H) 02/13/2018 1118   TRIG 120 02/13/2018 1118   HDL 85 02/13/2018 1118   LDLCALC 186 (H) 02/13/2018 1118   Hepatic Function Panel     Component Value Date/Time   PROT 6.0 02/13/2018 1118   ALBUMIN 4.2 02/13/2018 1118   AST 14 02/13/2018 1118   ALT 18 02/13/2018 1118   ALKPHOS 94 02/13/2018 1118  BILITOT 0.4 02/13/2018 1118   BILIDIR 0.0 02/10/2018 1449      Component Value Date/Time   TSH 1.340 02/13/2018 1118   ECG  shows NSR with a rate of 84 BPM. INDIRECT CALORIMETER done today shows a VO2 of 279 and a REE of 1945.  Her calculated basal metabolic rate is 9233 thus her basal metabolic rate is better than expected.  OBESITY BEHAVIORAL INTERVENTION VISIT  Today's visit was # 1   Starting weight: 221 lbs Starting date: 02/13/18 Today's weight : Weight: 221 lb (100.2 kg)  Today's date: 02/13/18 Total lbs lost to date: 0  ASK: We discussed the diagnosis of obesity with [name] today and Harly agreed to give [name] permission to  discuss obesity behavioral modification therapy today.  ASSESS: Lamiracle has the diagnosis of obesity and her BMI today is 40.9. Delaynie is in the action stage of change.   ADVISE: Cynthya was educated on the multiple health risks of obesity as well as the benefit of weight loss to improve her health. She was advised of the need for long term treatment and the importance of lifestyle modifications to improve her current health and to decrease her risk of future health problems.  AGREE: Multiple dietary modification options and treatment options were discussed and Saraiya agreed to follow the recommendations documented in the above note.  ARRANGE: Saphire was educated on the importance of frequent visits to treat obesity as outlined per CMS and USPSTF guidelines and agreed to schedule her next follow up appointment today.  I, Marcille Blanco, am acting as transcriptionist for Starlyn Skeans, MD  I have reviewed the above documentation for accuracy and completeness, and I agree with the above. -Dennard Nip, MD

## 2018-02-27 NOTE — Telephone Encounter (Signed)
[name] please advise if pt needs labs this week, and if so what labs are needed.  There are no future labs in pt's chart. Thanks!

## 2018-02-27 NOTE — Telephone Encounter (Signed)
Pt had appt with TP on 1/10 for preauthorization for cytuxan, this appt has been moved to MR's schedule at pt request- ok'ed by Raquel Sarna.  Nothing further needed at this time.

## 2018-02-28 ENCOUNTER — Ambulatory Visit (INDEPENDENT_AMBULATORY_CARE_PROVIDER_SITE_OTHER): Payer: BLUE CROSS/BLUE SHIELD | Admitting: Family Medicine

## 2018-02-28 ENCOUNTER — Encounter (INDEPENDENT_AMBULATORY_CARE_PROVIDER_SITE_OTHER): Payer: Self-pay | Admitting: Family Medicine

## 2018-02-28 VITALS — BP 128/85 | HR 112 | Ht 62.0 in | Wt 220.0 lb

## 2018-02-28 DIAGNOSIS — E559 Vitamin D deficiency, unspecified: Secondary | ICD-10-CM

## 2018-02-28 DIAGNOSIS — Z6841 Body Mass Index (BMI) 40.0 and over, adult: Secondary | ICD-10-CM

## 2018-02-28 DIAGNOSIS — E119 Type 2 diabetes mellitus without complications: Secondary | ICD-10-CM

## 2018-02-28 DIAGNOSIS — Z9189 Other specified personal risk factors, not elsewhere classified: Secondary | ICD-10-CM | POA: Diagnosis not present

## 2018-02-28 MED ORDER — VITAMIN D (ERGOCALCIFEROL) 1.25 MG (50000 UNIT) PO CAPS: 50000.0000 [IU] | ORAL_CAPSULE | ORAL | 0 refills | Status: DC

## 2018-02-28 NOTE — Telephone Encounter (Signed)
Per TP: patient will need STAT CBCD, CMP, UA prior to infusion.  This will need to be the day of the infusion and results will need to be paged to MR prior to infusion start.  Patient's infusion is scheduled for 1.13.20 at 0700  MR is scheduled in office that morning  Orders for the infusion will need to be done in an open office visit so that hospital orders can be accessed.  Called Short Stay at [facility] and spoke with [facility] to see if a pre-admit or admission document is opened on their behalf but it is not.  Orders will likely need to be done in the office visit encounter when patient comes for her appt with MR on 1.9.20 0930.  Will wait to discuss all of the above with TP prior to calling patient.

## 2018-02-28 NOTE — Telephone Encounter (Signed)
Since MR is back in the office today I can route message over to him, TP please advise if you would like for MR to take care of this, thank you.

## 2018-03-01 ENCOUNTER — Telehealth: Payer: Self-pay | Admitting: Internal Medicine

## 2018-03-01 NOTE — Progress Notes (Signed)
Office: [phone]  /  Fax: [phone]   HPI:   Chief Complaint: OBESITY Tasha Ballard is here to discuss her progress with her obesity treatment plan. She is on the Category 2 plan and is following her eating plan approximately 25 % of the time. She states she is exercising 0 minutes 0 times per week. Tasha Ballard struggled to follow her plan closely over the holidays, especially since her father in law was diagnosed with leukemia. She generally eats out a lot. She also feels that eating dairy increases nausea after chemo.  Her weight is 220 lb (99.8 kg) today and has had a weight loss of 1 pound over a period of 2 weeks since her last visit. She has lost 1 lb since starting treatment with [name].  Vitamin D deficiency Tasha Ballard has a diagnosis of vitamin D deficiency. She is currently taking vit D daily, but is not at goal. She admits fatigue.  Diabetes II Tasha Ballard has a diagnosis of diabetes type II. Tasha Ballard states BGs are mostly in the 80's, but her 2 hour post prandial readings often range from 180 to the 200's. This increased post prandial blood sugar may be due to increased holiday eating. Her last A1c was 5.6 on 02/13/18 and is well controlled. She has been working on intensive lifestyle modifications including diet, exercise, and weight loss to help control her blood glucose levels.  At risk for cardiovascular disease Tasha Ballard is at a higher than average risk for cardiovascular disease due to diabetes and obesity. She currently denies any chest pain.  ASSESSMENT AND PLAN:  Vitamin D deficiency - Plan: Vitamin D, Ergocalciferol, (DRISDOL) 1.25 MG (50000 UT) CAPS capsule  Type 2 diabetes mellitus without complication, without long-term current use of insulin (HCC)  At risk for heart disease  Class 3 severe obesity with serious comorbidity and body mass index (BMI) of 40.0 to 44.9 in adult, unspecified obesity type ([place])  PLAN:  Vitamin D Deficiency Tasha Ballard was informed that  low vitamin D levels contributes to fatigue and are associated with obesity, breast, and colon cancer. She agrees to start to take prescription Vit D _0 ,000 IU every week #4 with no refills and to discontinue daily vitamin D. She will follow up for routine testing of vitamin D, at least 2-3 times per year. She was informed of the risk of over-replacement of vitamin D and agrees to not increase her dose unless she discusses this with [name] first. Tasha Ballard agrees to follow up in 2 weeks.  Diabetes II Tasha Ballard has been given extensive diabetes education by myself today including ideal fasting and post-prandial blood glucose readings, individual ideal Hgb A1c goals, and hypoglycemia prevention. We discussed the importance of good blood sugar control to decrease the likelihood of diabetic complications such as nephropathy, neuropathy, limb loss, blindness, coronary artery disease, and death. We discussed the importance of intensive lifestyle modification including diet, exercise and weight loss as the first line treatment for diabetes. Tasfia agrees to continue her metformin and to get back to her diet. We will continue to monitor and she will follow up at the agreed upon time.  Cardiovascular risk counseling Tasha Ballard was given extended (15 minutes) coronary artery disease prevention counseling today. She is 48 y.o. female and has risk factors for heart disease including diabetes and obesity. We discussed intensive lifestyle modifications today with an emphasis on specific weight loss instructions and strategies. Pt was also informed of the importance of increasing exercise and decreasing saturated fats to help prevent heart disease.  Obesity Tasha Ballard is currently in the action stage of change. As such, her goal is to continue with weight loss efforts. She has agreed to keep a food journal with 1400 to 1600 calories and 90 grams of protein.  Tasha Ballard has been instructed to work up to a goal of 150  minutes of combined cardio and strengthening exercise per week for weight loss and overall health benefits. We discussed the following Behavioral Modification Strategies today: increasing lean protein intake, decreasing simple carbohydrates, no skipping meals, and decrease eating out. I suspect that Tasha Ballard will have a difficult time following a structured plan and we will change to journaling.  Tasha Ballard has agreed to follow up with our clinic in 2 weeks. She was informed of the importance of frequent follow up visits to maximize her success with intensive lifestyle modifications for her multiple health conditions.  ALLERGIES: Allergies  Allergen Reactions  . Macrobid [Nitrofurantoin Macrocrystal] Nausea And Vomiting  . Adhesive [Tape] Rash    MEDICATIONS: Current Outpatient Medications on File Prior to Visit  Medication Sig Dispense Refill  . apixaban (ELIQUIS) 5 MG TABS tablet Take 5 mg by mouth 2 (two) times daily.    . benzonatate (TESSALON) 100 MG capsule Take 100 mg by mouth 3 (three) times daily as needed for cough.     . carvedilol (COREG) 6.25 MG tablet Take 6.25 mg by mouth 2 (two) times daily with a meal.    . cholecalciferol (VITAMIN D) 25 MCG (1000 UT) tablet Take 1,000 Units by mouth daily.    . furosemide (LASIX) 40 MG tablet Take 40 mg by mouth See admin instructions. Take 1 tablet (40 mg) daily, may repeat dose if needed for fluid retention.    . metFORMIN (GLUCOPHAGE) 500 MG tablet Take 500 mg by mouth 2 (two) times daily.     . montelukast (SINGULAIR) 10 MG tablet Take 10 mg by mouth daily after supper.    Marland Kitchen OFEV 150 MG CAPS Take 150 mg by mouth 2 (two) times daily.  11  . ondansetron (ZOFRAN) 8 MG tablet Take 1 tablet (8 mg total) by mouth every 8 (eight) hours as needed for nausea or vomiting. 45 tablet 3  . potassium chloride (K-DUR) 10 MEQ tablet Take 10 mEq by mouth 2 (two) times daily.   0  . predniSONE (DELTASONE) 10 MG tablet Take 10-30m as needed for SOB  (Patient taking differently: Take 10 mg by mouth daily with breakfast. ) 30 tablet 3  . PROAIR HFA 108 (90 Base) MCG/ACT inhaler Inhale 2 puffs into the lungs every 6 (six) hours as needed for wheezing or shortness of breath.  4  . spironolactone (ALDACTONE) 100 MG tablet Take 100 mg by mouth daily.    .Marland Kitchensulfamethoxazole-trimethoprim (BACTRIM) 400-80 MG tablet Once a day while on prednisone (Patient taking differently: Take 1 tablet by mouth every evening. ) 90 tablet 1  . thyroid (ARMOUR) 90 MG tablet Take 90 mg by mouth daily before breakfast.      No current facility-administered medications on file prior to visit.     PAST MEDICAL HISTORY: Past Medical History:  Diagnosis Date  . Aneurysm ([place]   . Back pain   . Diabetes mellitus without complication ([place]   . H/O blood clots   . Hypertension   . Joint pain   . Pulmonary fibrosis ([place]   . SOB (shortness of breath)   . Swelling   . Thyroid disease   . Vitamin D deficiency  PAST SURGICAL HISTORY: Past Surgical History:  Procedure Laterality Date  . ABLATION    . IR IMAGING GUIDED PORT INSERTION  01/26/2018  . KNEE SURGERY    . LUNG BIOPSY      SOCIAL HISTORY: Social History   Tobacco Use  . Smoking status: Never Smoker  . Smokeless tobacco: Never Used  Substance Use Topics  . Alcohol use: Not Currently  . Drug use: Never    FAMILY HISTORY: Family History  Problem Relation Age of Onset  . Heart failure Mother   . Heart disease Mother   . Rheumatologic disease Father     ROS: Review of Systems  Constitutional: Positive for malaise/fatigue and weight loss.  Cardiovascular: Negative for chest pain.  Gastrointestinal: Positive for nausea.    PHYSICAL EXAM: Blood pressure 128/85, pulse (!) 112, height _0  (1.575 m), weight 220 lb (99.8 kg), SpO2 96 %. Body mass index is 40.24 kg/m. Physical Exam Vitals signs reviewed.  Constitutional:      Appearance: Normal appearance. She is obese.    Cardiovascular:     Rate and Rhythm: Normal rate.  Pulmonary:     Effort: Pulmonary effort is normal.  Musculoskeletal: Normal range of motion.  Skin:    General: Skin is warm and dry.  Neurological:     Mental Status: She is alert and oriented to person, place, and time.  Psychiatric:        Mood and Affect: Mood normal.        Behavior: Behavior normal.     RECENT LABS AND TESTS: BMET    Component Value Date/Time   NA 136 02/13/2018 1118   K 3.6 02/13/2018 1118   CL 87 (L) 02/13/2018 1118   CO2 28 02/13/2018 1118   GLUCOSE 91 02/13/2018 1118   GLUCOSE 237 (H) 02/10/2018 1449   BUN 11 02/13/2018 1118   CREATININE 0.66 02/13/2018 1118   CALCIUM 9.3 02/13/2018 1118   GFRNONAA 104 02/13/2018 1118   GFRAA 120 02/13/2018 1118   Lab Results  Component Value Date   HGBA1C 5.6 02/13/2018   Lab Results  Component Value Date   INSULIN 7.4 02/13/2018   CBC    Component Value Date/Time   WBC 5.9 02/13/2018 1118   WBC 6.5 02/10/2018 1449   RBC 4.62 02/13/2018 1118   RBC 4.43 02/10/2018 1449   HGB 11.1 02/13/2018 1118   HCT 37.2 02/13/2018 1118   PLT 291.0 02/10/2018 1449   MCV 81 02/13/2018 1118   MCH 24.0 (L) 02/13/2018 1118   MCH 24.9 (L) 02/03/2018 1055   MCHC 29.8 (L) 02/13/2018 1118   MCHC 31.1 02/10/2018 1449   RDW 15.6 (H) 02/13/2018 1118   LYMPHSABS 0.8 02/13/2018 1118   MONOABS 0.3 02/10/2018 1449   EOSABS 0.2 02/13/2018 1118   BASOSABS 0.1 02/13/2018 1118   Iron/TIBC/Ferritin/ %Sat No results found for: IRON, TIBC, FERRITIN, IRONPCTSAT Lipid Panel     Component Value Date/Time   CHOL 295 (H) 02/13/2018 1118   TRIG 120 02/13/2018 1118   HDL 85 02/13/2018 1118   LDLCALC 186 (H) 02/13/2018 1118   Hepatic Function Panel     Component Value Date/Time   PROT 6.0 02/13/2018 1118   ALBUMIN 4.2 02/13/2018 1118   AST 14 02/13/2018 1118   ALT 18 02/13/2018 1118   ALKPHOS 94 02/13/2018 1118   BILITOT 0.4 02/13/2018 1118   BILIDIR 0.0 02/10/2018 1449       Component Value Date/Time   TSH 1.340  02/13/2018 1118   Results for Wolpert, Anisia Y "[name]" (MRN [id number]) as of 03/01/2018 10:58  Ref. Range 02/13/2018 11:18  Vitamin D, 25-Hydroxy Latest Ref Range: 30.0 - 100.0 ng/mL 20.9 (L)    OBESITY BEHAVIORAL INTERVENTION VISIT  Today's visit was # 2   Starting weight: 221 lbs Starting date: 02/13/18 Today's weight : Weight: 220 lb (99.8 kg)  Today's date: 02/28/2018 Total lbs lost to date: 1  ASK: We discussed the diagnosis of obesity with [name] today and Jace agreed to give [name] permission to discuss obesity behavioral modification therapy today.  ASSESS: Letricia has the diagnosis of obesity and her BMI today is 40.2. Kamryn is in the action stage of change.   ADVISE: Karime was educated on the multiple health risks of obesity as well as the benefit of weight loss to improve her health. She was advised of the need for long term treatment and the importance of lifestyle modifications to improve her current health and to decrease her risk of future health problems.  AGREE: Multiple dietary modification options and treatment options were discussed and Shawanna agreed to follow the recommendations documented in the above note.  ARRANGE: Sreenidhi was educated on the importance of frequent visits to treat obesity as outlined per CMS and USPSTF guidelines and agreed to schedule her next follow up appointment today.  I, Marcille Blanco, am acting as transcriptionist for Starlyn Skeans, MD  I have reviewed the above documentation for accuracy and completeness, and I agree with the above. -Dennard Nip, MD

## 2018-03-01 NOTE — Telephone Encounter (Signed)
Tasha Ballard, has the Patient been called or has TP addressed this yet?  Will route to [place] to follow up

## 2018-03-01 NOTE — Telephone Encounter (Signed)
Called and spoke with Tasha Ballard, she stated that she wanted to know if the STAT labs of CBC, UA, and CMP needed to be done before every infusion. Per JJ yes they do. I made Kim aware of that. Kim's other question was if they could start the D51/2 NS 200cc per hour over 2 hours before the lab results were resulted or if they had to wait until results came back. Will forward to TP for the answer to this. While on the phone with Tasha Ballard I asked her if the patient needed to have labs done the morning of the infusion or if it could be done the day before. Tasha Ballard stated that they could be done the day before but we would have to call them with the results. TP please advise thank you.

## 2018-03-02 ENCOUNTER — Encounter (INDEPENDENT_AMBULATORY_CARE_PROVIDER_SITE_OTHER): Payer: Self-pay

## 2018-03-02 ENCOUNTER — Ambulatory Visit: Payer: BLUE CROSS/BLUE SHIELD | Admitting: Internal Medicine

## 2018-03-02 ENCOUNTER — Encounter: Payer: Self-pay | Admitting: Internal Medicine

## 2018-03-02 ENCOUNTER — Telehealth: Payer: Self-pay | Admitting: Internal Medicine

## 2018-03-02 VITALS — BP 126/70 | HR 87 | Ht 62.0 in | Wt 222.4 lb

## 2018-03-02 DIAGNOSIS — Z79899 Other long term (current) drug therapy: Secondary | ICD-10-CM | POA: Diagnosis not present

## 2018-03-02 DIAGNOSIS — J849 Interstitial pulmonary disease, unspecified: Secondary | ICD-10-CM | POA: Diagnosis not present

## 2018-03-02 DIAGNOSIS — J8489 Other specified interstitial pulmonary diseases: Secondary | ICD-10-CM | POA: Diagnosis not present

## 2018-03-02 DIAGNOSIS — J9611 Chronic respiratory failure with hypoxia: Secondary | ICD-10-CM

## 2018-03-02 DIAGNOSIS — R112 Nausea with vomiting, unspecified: Secondary | ICD-10-CM

## 2018-03-02 DIAGNOSIS — T50905A Adverse effect of unspecified drugs, medicaments and biological substances, initial encounter: Secondary | ICD-10-CM

## 2018-03-02 NOTE — Patient Instructions (Addendum)
ICD-10-CM   1. ILD (interstitial lung disease) ([place]) J84.9   2. 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 Chronic respiratory failure with hypoxia (HCC) J96.11   4. Encounter for long-term current use of high risk medication Z79.899   5. Drug-induced nausea and vomiting R11.2    T50.905A    In terms of interstitial lung disease and high risk medication use - Glad you  feel objectively improved - Clinically appears stable -We will do spirometry and DLCO in the next 4-6 weeks -Continue with prednisone immunosuppression and Bactrim for prophylaxis -Continue nintedanib (most recent liver function test February 13, 2018 was normal] -On March 06, 2018 -report to the infusion center at [facility] and pre-infusion we will do labs and if okay we will continue with cycle #2 of IV Cytoxan being done every 4 weeks   -As you know your Kuzel count is dropping although still acceptable - Continue o2 - 2L at rest and  8L with exertion  Nausea and vomiting -This is mild and intermittent and likely related to nintedanib -Monitor your food intake and dietary pattern to see if any specific food is contributory -You can still continue Zofran as needed  Obesity -I have contacted Dr. Leafy Ro to see if any weight loss drugs can play a role to help you lose weight -Currently as you know this the main barrier towards a lung transplant  Follow-up -4 weeks from today do spirometry and DLCO -Return to clinic 4 weeks from today but 1 or 2 days before the next infusion

## 2018-03-02 NOTE — Telephone Encounter (Signed)
Hi Dr Ludwig Clarks if we can start Tasha Ballard on a weight loss drug to accelerate weight loss so she can be lung transplant eligible asap given that her prognosis from lung disease is poor  Thanks    SIGNATURE    Dr. Brand Males, M.D., F.C.C.P,  Pulmonary and Critical Care Medicine Staff Physician, [place] Director - Interstitial Lung Disease  Program  P[facility] at [place], [place], [address]  Pager: [phone], If no answer or between  15:00h - 7:00h: call [phone] Telephone: [phone]  10:18 AM 03/02/2018

## 2018-03-02 NOTE — Telephone Encounter (Signed)
TP spoke with Tasha Ballard personally Will sign off

## 2018-03-02 NOTE — Telephone Encounter (Signed)
Got it. Sounds good. I went off what [facility] transplant team does - they start weight loss drugs hoping to get weight off sooner because her life expectancy is months and if not for her obesity she would have been transplanted now. I do not see [facility] doing this. So was not sure . Transplants teams want low bMI for transplant and they seem to be in a hurry on that. Happy to go with your plan on this.

## 2018-03-02 NOTE — Progress Notes (Signed)
-Lung function November 08, 2017: FVC 0.9 L / 27%, DLCO 5.86/29%  - HRCT 11/08/17 Rt diaphragm elevation ? Eventration v paresis.  Esophagogram November 11, 2017 - > 11/11/17 [name] - Normal  Dr. Threasa Alpha at [facility].She was started on  IOV 11/22/2017  Chief Complaint  Patient presents with  . Consult    consut due to pulmonary fibrosis. Pt has been followed by [facility] and last PFT was peformed 8/14 at [facility]. Pt does have complaints of SOB with exertion which she states has become better due to rehab. Pt also has a dry hacking cough with mild production of mucus. Denies any CP.    Is seen by presents to the ILD clinic.  This is her first visit with [name].  History is obtained from her and review of the [place] where she is to follow-up with Dr. Hortencia Pilar.  Interstitial lung disease clinic integrated interstitial lung disease questionnaire: Symptoms: Shortness of breath for years.  Gradually worse.  Currently level 5 shortness of breath walking up stairs or walking up a hill with oxygen.  Level for shortness of breath for sweeping or vacuuming and level 3 shortness of breath for shopping or picking up things and doing laundry.  Level 3 shortness of breath or standing up from a chair or taking a shower making the bed and level 1 dyspnea for brushing teeth.  She also has a cough for 6 years which is getting worse.  On and off that is phlegm.  Occasional wheezing present there is associated fatigue.  She tells me that she had undiagnosed and unrecognized ILD for years.  This was then picked up at [facility] and then she got referred to [facility].  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  At this time she was placed on CellCept.  Then in 2016 she got somewhat worse and was placed on 2 L nasal cannula continuously.  In 2017 she tells me that she had a aortic aneurysm and this was  repaired at [facility].  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  And then for the last year and a half or so she has been on significantly worsening hypoxemia.  In the spring 2019 she is requiring 8 L at rest and 15 L at exertion.  Apparently Dr. Lauris Chroman then recommended Rituxan infusions which she got to 60 days apart with the last one being approximately in April of May 2019.  After that she is somewhat improved requiring only 9-10 L with exertion.  She has been attending pulmonary rehabilitation which has been helping.  She is obese and unable to lose weight although she is lost some.  She believes a lot of her oxygen needs might be related to deviated nasal septum/septal perforation.  She subsequently followed up with Dr. Ruthann Cancer at [facility] in the summer 2019.  I reviewed the notes.  Palliative care was recommended because of progressive respiratory failure.  She has never seen the transplant clinic at [facility] but she recommends that because of obesity and diaphragmatic dysfunction that she would not be a good candidate for transplant .  She was then frustrated with experience because she wants to fight the disease.  She then drove to [facility] and saw Dr. Elisabeth Cara at [facility] 1 week ago.  She spent substantial number of  days there.  At this point in time she showed me an email from him that suggest he might have myositis-ILD but they would like to review her CT scans and pathology from [facility].  Apparently she had significant amount of blood work and other tests at [facility]  I do not have access to these records at this point.  She believes that she wants to undergo transplant.  She thinks that she would be able to have transplant in [place] despite a high BMI.  Currently in [place] she is somewhat stable attending pulmonary rehabilitation.  She is interested in pulmonary trials  Past  medical history  -Tested for sleep apnea at [facility] and was told to be negative.  But at [facility] in September 2019 they recommended a repeat sleep study -Denied all forms of connective tissue disease diagnosis and vasculitis.  Although at [facility] September 2019 when she was seen last week she was told she might have myositis-ILD but this is pending confirmation in the multidisciplinary case conference -She does report positive for diabetes and thyroid disease and mononucleosis in the past but denies any tuberculosis or kidney disease   Review of systems -Positive for fatigue for a few years, arthralgia for a few years but denies dysphagia.  Positive for dry eyes and mouth for few years.  No Raynaud.  No recurrent fever.  No weight loss.  No acid reflux but does admit to snoring for a few years.  No rash no ulcers.  Family history of lung disease COPD and a long but no pulmonary fibrosis.  Dad did have rheumatoid arthritis  Personal exposure history Denies tobacco, vaping, marijuana, cocaine, IV drugs  Home and hobby history  [place] single[place] home which is 50 years old  Occupational history -She does stay in a condition spaces.  122 point occupational questionnaire completely negative  Medication history -She has a history of allergy to Macrobid/nitrofurantoin.  Do not know when she took it.  She is currently on immunosuppressive regimen of CellCept, Bactrim and prednisone.  CellCept since 2013 and prednisone since 2012.  She did get Rituxan x2 doses with the last one being in spring 2019.  She feels her hypoxemia improved somewhat after that.     has a past medical history of Aneurysm ([place]), Diabetes mellitus without complication ([place]), and Thyroid disease.   reports that she has never smoked. She has never used smokeless tobacco.   OV 12/21/2017  Subjective:  Patient ID: Tasha Ballard, female , DOB: 1971-02-13 , age 6 y.o. , MRN: [id number] ,  ADDRESS: [address] [place] [address]   12/21/2017 -   Chief Complaint  Patient presents with  . Follow-up    ILD     HPI MALONIE TATUM 48 y.o. -returns for follow-up.  She brings her mother along who I am meeting for the first time.  This visit is specifically focused on evaluation from [place].  Yesterday I did speak to the interstitial lung disease specialist at [facility] Dr. Threasa Alpha.  He told me he was finally able to receive and review the surgical lung biopsy from [facility].  He and his pathologist agreed with the diagnosis of progressive NSIP.  His main recommendation was to switch patient to Cytoxan.  In addition also aggressively pursue weight loss even if it means for bariatric surgery approach so we can get ready for transplant.  He agreed the prognosis is very poor.  At this point in time patient compared to the last visit is stable.  She is using more than 10 L of oxygen up to 15 L of oxygen.  She is interested in Cytoxan.  Dr. Elisabeth Cara indicated to me that efficacy wise oral and IV are similar but he felt IV would be a little bit safer from a urologic standpoint.  Patient is interested in weight loss.  She says she has had difficulty losing weight despite being hypocaloric.  She is interested in nutritional referral.  In addition she wants a sleep specialist referral.  She says [place] [facility] was ruled out.  She has significant insomnia and daytime tiredness.  She said that [place] recommended sleep referral ASAP.  She is registered to participate in the ILD-pro registry study for progressive non-IPF disease.  This was done today.   Investigations at [facility] -November 09, 2017: Echocardiogram ejection fraction 70-75% with grade 2 diastolic dysfunction.  Right ventricle was mildly enlarged but global RV function was felt to be normal.  No pericardial effusion no obvious right-to-left shunt at rest or cough or Valsalva on agitated  saline contrast exam and stage I right ventricular diastolic dysfunction.  Pulmonary artery systolic pressure at 45 mmHg  -Lung function November 08, 2017: FVC 0.9 L / 27%, DLCO 5.86/29%  - HRCT 11/08/17 Rt diaphragm elevation ? Eventration v paresis. Sniff test recommendd  Esophagogram November 11, 2017 - > 11/11/17 Natil Jewsh - Normal    12/22/2017-telephone encounter - Dr. [name] >>>starting Cytoxan 50 mg/day >>>Avoid lots of fluids to avoid cystitis, avoid intercourse without contraception, return in 7 to 10 days to recheck CBC, Bmet, LFTs, UA  12/22/17-office visit-[place] >>>Plan in lab sleep study be ordered, patient will likely need BiPAP with oxygen  12/30/2017  - Visit   48 year old female patient presenting today for one-week follow-up visit after starting Cytoxan.  Patient also get lab work completed today.  Unfortunately patient did not get this completed prior to her office visit she will get afterwards.  Patient reports baseline symptoms of nonproductive cough.  Requiring 4 to 6 L of oxygen at rest as well as 10 to 15 L of oxygen with exertion.  Patient does have her baseline shortness of breath with exertion.  Patient reporting no worsening symptoms since starting Cytoxan.   Patient reports she typically is on 9 to 10 L of oxygen at home.   OV 01/17/2018  Subjective:  Patient ID: Tasha Ballard, female , DOB: 11-Mar-1970 , age 48 y.o. , MRN: [id number] , ADDRESS: [address] [place] [address]   01/17/2018 -   Chief Complaint  Patient presents with  . Follow-up    2 week f/u, lung transplant, was contacted and waiting on weight loss to continue   PRogressive NSIP Chronic hypoxemic resp failure Morbid Obesity - needs weight loss for transplant Rx regimen   -  cytoxan 9m per day 12/23/17 and increaed to 1057mon 12/23/17.On11/19/19. increase to 12558mer day  And switched to IV cytoxan Q4 week cycle - 1st cycle 02/03/18.   HPI ChrLEYANNA BITTMAN 59.o. -presents for follow-up with her husband.  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 cytoxan 64m2mr day 12/23/17 and increaed to 100mg51m11/1/19.  On 01/10/18.  increase to 125mg 9mday .  Plan is to get blood work today.  She wants a Port-A-Cath placed.  She prefers IV Cytoxan.  I checked with Dr. Threasa Alpha and he feels either p.o. or IV Cytoxan is fine but in the balance IV Cytoxan has a slightly lower incidence of hemorrhagic cystitis and the patient prefers this.  At this point in time she tells me that the Cytoxan seems to be working well for her.  She is less hypoxemic she is able to get by with 5 L at rest.  On the other hand this could be because of weight loss.  However there is some fatigue.  She will have blood work and urine analysis to look for cytotoxicity from Cytoxan.  Based on the newINBUILD  data we discussed about starting nintedanib.  I checked with Dr. Threasa Alpha at [facility] and he is supportive of the plan.  Patient states that she has done extensive research on nintedanib and she is very excited about the possibility of taking this.   Obesity: Weight 231#  11/22/17  -> 221# 01/17/2018.,  She saw the nutritionist at [facility].  She is using an app and tracking her weight on a regular basis.  She is encouraged by the weight loss.  We gave her a low glycemic diet sheet.  She has not been able to see the bariatric surgeon at [facility] surgery but I did run into Dr. Kaylyn Lim in the hallway and he said he would be happy to see her and counsel her.  Although he did acknowledge she would be high risk for surgery.  He is in favor of low carbohydrate diet.  A few weeks ago I met the transplant physician from [facility] and he brought up the idea about losing weight loss drugs.  His preferred drug is phentermine.  We discussed the  possibility about getting an endocrine consult  Lung transplant: [facility] has called her.  They want to BMI less than 30 or 36 I am not sure.  She is working towards his goal.  She has not heard from [facility].  I emailed them and the day after the visit patient is emailed me saying that she has heard from them.  She is very keen to get on the transplant list.    02/03/2018 OV for Direct Admit for IV Cytoxan  Patient presents to the office today for direct admission to [facility] for IV Cytoxan infusion.  She says her breathing is at baseline.  She remains on oxygen 4 to 6 L at rest and 10 to 15 L with activity.  He says her leg swelling is at baseline.  She remains on Lasix 40 mg daily and Aldactone 100 mg.  She is on chronic steroids with prednisone 10 mg daily. She has been on 05 x1 week.  Says she is tolerating with no nausea vomiting or diarrhea.  Does have some mild constipation.  Patient says she had a Port-A-Cath placed 1 week ago. Patient has chronic shortness of breath gets winded with minimal activity.  Has intermittent dry cough.   02/13/2018 Follow up : Progressive NSIP , O2 RF  Patient returns for a one-week follow-up.  Patient was recently admitted for IV Cytoxan under observation status.. Patient had been on oral Cytoxan.  Continue to have progressive worsening of her ILD.  At baseline she is on 4 to 6 L at rest.  And 10 to 15 L with activity.  She is on Aldactone and Lasix.  She has been started on  OFEV 3 weeks ago.  She is tolerating well with no nausea vomiting or diarrhea.  Does have some mild constipation. Since her Cytoxan infusion.  She says she has done well.  She had minimal nausea.  She does feel that her breathing has slightly improved.  Her oxygen demands are decreased.  She is using mainly only 6 L of oxygen.  And doing well with this with good O2 saturations. Says she was able to actually go out to dinner for the first time in a long  time.  She denies any hematuria, chest pain, orthopnea or increased leg swelling.  She is working on weight loss.  She is going to the weight loss center. Labs were done on 12/20 that showed normal [place] blood cell count, platelets and kidney function. Urinalysis was done today.  And is pending.. Plans are for a Cytoxan infusion in 3- 4 weeks from her last infusion.  OV 03/02/2018  Subjective:  Patient ID: Tasha Ballard, female , DOB: 04-Dec-1970 , age 75 y.o. , MRN: [id number] , ADDRESS: [address] [place] [address]   03/02/2018 -   Chief Complaint  Patient presents with  . Follow-up    Appt prior to pt's next Cytoxan infusion.  Pt states she is doing well since last visit. States she can tell that her breathing has improved. Has had some nausea/vomiting and wonders if it is from the [place].   #Morbid Obesity - needs weight loss for transplant - sees Dr Janeal Holmes since dec 2019 #PRogressive NSIP #Chronic hypoxemic resp failure - 2L at rest, 8L with exertion # High risk treatment regimen  -CellCept 2013 through 2019  -Chronic prednisone 2013 -currently taking -Bactrim prophylaxis 2013 --currently taking  - Cytoxan 57m per day 12/23/17 and increaed to 1012mon 12/23/17.On11/19/19. increase to 12521mer day  And switched to IV cytoxan Q4 week cycle - 1st cycle 02/03/18 (s/p portocath 01/26/18)  - Ofev 01/23/18 (approximnate)   HPI ChrDEVON KINGDON 49o. -presents for the above issues.  I personally saw her before Thanksgiving 2019.  After that she is seen by nurse practitioner under my supervision as documented above for Cytoxan infusion.  She has had post Cytoxan infusion labs which showed her [place] count is decreasing but still adequate.  Most recent labs was February 13, 2018 at the weight loss clinic with a [place] count of 5900.  Rest of the labs reviewed and appear adequate and normal.  Including liver function test.  She is developed new onset of intermittent nausea and  vomiting ever since starting nintedanib early December 2019.  This particularly happens with the Christmas meals.  She says this appears at random and is mild and is treated with Zofran.  Given the beneficial effects of nintedanib and progressive non-IPF ILD she is agreed to continue to take this.  She continues on her chronic prednisone and Bactrim prophylaxis.  She is finished 1 cycle of IV Cytoxan February 03, 2018.  She has upcoming second cycle 4 weeks apart on March 06, 2018.  At this point in time she is feeling well.  She feels her ILD has actually improved.  On 2 L at rest she was saturating at 98%.  This is a huge improvement for her.  However when we walked her she still needed 8 L of oxygen with exertion.  She still says this is an improvement because in the past she was using more than 10 L for exertion.  Today my nurse  practitioner will enter the orders for upcoming infusion.  There is no fever.  Morbid obesity: She has seen Dr. Leafy Ro at the weight loss clinic.  I reviewed the notes.  Dietary measures are indicated.  I have written to Dr. Leafy Ro asking about medications to accelerate weight loss given the fact her lung disease is poor prognosis and she is an urgent need for lung transplant and with weight loss being her main barrier.   Obesity: Weight 231#  11/22/17  -> 221# 01/17/2018., -> 222# on 03/02/2018    ROS - per HPI     has a past medical history of Aneurysm ([place]), Back pain, Diabetes mellitus without complication ([place]), H/O blood clots, Hypertension, Joint pain, Pulmonary fibrosis (HCC), SOB (shortness of breath), Swelling, Thyroid disease, and Vitamin D deficiency.   reports that she has never smoked. She has never used smokeless tobacco.  Past Surgical History:  Procedure Laterality Date  . ABLATION    . IR IMAGING GUIDED PORT INSERTION  01/26/2018  . KNEE SURGERY    . LUNG BIOPSY      Allergies  Allergen Reactions  . Macrobid [Nitrofurantoin Macrocrystal]  Nausea And Vomiting  . Adhesive [Tape] Rash    Immunization History  Administered Date(s) Administered  . Influenza,inj,Quad PF,6+ Mos 12/24/2014, 02/05/2016, 01/07/2017, 11/22/2017  . Pneumococcal Conjugate-13 07/20/2017  . Pneumococcal Polysaccharide-23 06/09/2012    Family History  Problem Relation Age of Onset  . Heart failure Mother   . Heart disease Mother   . Rheumatologic disease Father      Current Outpatient Medications:  .  apixaban (ELIQUIS) 5 MG TABS tablet, Take 5 mg by mouth 2 (two) times daily., Disp: , Rfl:  .  benzonatate (TESSALON) 100 MG capsule, Take 100 mg by mouth 3 (three) times daily as needed for cough. , Disp: , Rfl:  .  carvedilol (COREG) 6.25 MG tablet, Take 6.25 mg by mouth 2 (two) times daily with a meal., Disp: , Rfl:  .  cholecalciferol (VITAMIN D) 25 MCG (1000 UT) tablet, Take 1,000 Units by mouth daily., Disp: , Rfl:  .  furosemide (LASIX) 40 MG tablet, Take 40 mg by mouth See admin instructions. Take 1 tablet (40 mg) daily, may repeat dose if needed for fluid retention., Disp: , Rfl:  .  metFORMIN (GLUCOPHAGE) 500 MG tablet, Take 500 mg by mouth 2 (two) times daily. , Disp: , Rfl:  .  montelukast (SINGULAIR) 10 MG tablet, Take 10 mg by mouth daily after supper., Disp: , Rfl:  .  OFEV 150 MG CAPS, Take 150 mg by mouth 2 (two) times daily., Disp: , Rfl: 11 .  ondansetron (ZOFRAN) 8 MG tablet, Take 1 tablet (8 mg total) by mouth every 8 (eight) hours as needed for nausea or vomiting., Disp: 45 tablet, Rfl: 3 .  potassium chloride (K-DUR) 10 MEQ tablet, Take 10 mEq by mouth 2 (two) times daily. , Disp: , Rfl: 0 .  predniSONE (DELTASONE) 10 MG tablet, Take 10-41m as needed for SOB (Patient taking differently: Take 10 mg by mouth daily with breakfast. ), Disp: 30 tablet, Rfl: 3 .  PROAIR HFA 108 (90 Base) MCG/ACT inhaler, Inhale 2 puffs into the lungs every 6 (six) hours as needed for wheezing or shortness of breath., Disp: , Rfl: 4 .  spironolactone  (ALDACTONE) 100 MG tablet, Take 100 mg by mouth daily., Disp: , Rfl:  .  sulfamethoxazole-trimethoprim (BACTRIM) 400-80 MG tablet, Once a day while on prednisone (Patient taking differently: Take  1 tablet by mouth every evening. ), Disp: 90 tablet, Rfl: 1 .  thyroid (ARMOUR) 90 MG tablet, Take 90 mg by mouth daily before breakfast. , Disp: , Rfl:  .  Vitamin D, Ergocalciferol, (DRISDOL) 1.25 MG (50000 UT) CAPS capsule, Take 1 capsule (50,000 Units total) by mouth every 7 (seven) days., Disp: 4 capsule, Rfl: 0      Objective:   Vitals:   03/02/18 0949  BP: 126/70  Pulse: 87  SpO2: 96%  Weight: 222 lb 6.4 oz (100.9 kg)  Height: _0  (1.575 m)    Estimated body mass index is 40.68 kg/m as calculated from the following:   Height as of this encounter: _1  (1.575 m).   Weight as of this encounter: 222 lb 6.4 oz (100.9 kg).  _2 @  [employer]   03/02/18 0949  Weight: 222 lb 6.4 oz (100.9 kg)     Physical Exam  General Appearance:    Alert, cooperative, no distress, appears stated age - yes , Deconditioned looking - no , OBESE  - yes, Sitting on Wheelchair -  no  Head:    Normocephalic, without obvious abnormality, atraumatic  Eyes:    PERRL, conjunctiva/corneas clear,  Ears:    Normal TM's and external ear canals, both ears  Nose:   Nares normal, septum midline, mucosa normal, no drainage    or sinus tenderness. OXYGEN ON  - yes . Patient is @ 2 to 8L   Throat:   Lips, mucosa, and tongue normal; teeth and gums normal. Cyanosis on lips - no  Neck:   Supple, symmetrical, trachea midline, no adenopathy;    thyroid:  no enlargement/tenderness/nodules; no carotid   bruit or JVD  Back:     Symmetric, no curvature, ROM normal, no CVA tenderness  Lungs:     Distress - no , Wheeze no, Barrell Chest - no, Purse lip breathing - no, Crackles - fine   Chest Wall:    No tenderness or deformity.    Heart:    Regular rate and rhythm, S1 and S2 normal, no rub   or gallop, Murmur  - no  Breast Exam:    NOT DONE  Abdomen:     Soft, non-tender, bowel sounds active all four quadrants,    no masses, no organomegaly. Visceral obesity - yes  Genitalia:   NOT DONE  Rectal:   NOT DONE  Extremities:   Extremities - normal, Has Cane - no, Clubbing - no, Edema - no  Pulses:   2+ and symmetric all extremities  Skin:   Stigmata of Connective Tissue Disease - no  Lymph nodes:   Cervical, supraclavicular, and axillary nodes normal  Psychiatric:  Neurologic:   Pleasant - yes, Anxious - no, Flat affect - no  CAm-ICU - neg, Alert and Oriented x 3 - yes, Moves all 4s - yes, Speech - normal, Cognition - intact           Assessment:       ICD-10-CM   1. ILD (interstitial lung disease) ([place]) J84.9   2. NSIP (nonspecific interstitial pneumonia) ([place]) J84.89   3. Chronic respiratory failure with hypoxia (HCC) J96.11   4. Encounter for long-term current use of high risk medication Z79.899   5. Drug-induced nausea and vomiting R11.2    T50.905A        Plan:     Patient Instructions     ICD-10-CM   1. ILD (interstitial lung disease) ([place]) J84.9  2. NSIP (nonspecific interstitial pneumonia) ([place]) J84.89   3. Chronic respiratory failure with hypoxia (HCC) J96.11   4. Encounter for long-term current use of high risk medication Z79.899   5. Drug-induced nausea and vomiting R11.2    T50.905A    In terms of interstitial lung disease and high risk medication use - Glad you  feel objectively improved - Clinically appears stable -We will do spirometry and DLCO in the next 4-6 weeks -Continue with prednisone immunosuppression and Bactrim for prophylaxis -Continue nintedanib (most recent liver function test February 13, 2018 was normal] -On March 06, 2018 -report to the infusion center at [facility] and pre-infusion we will do labs and if okay we will continue with cycle #2 of IV Cytoxan being done every 4 weeks   -As you know your [place] count is dropping although still  acceptable - Continue o2 - 2L at rest and  8L with exertion  Nausea and vomiting -This is mild and intermittent and likely related to nintedanib -Monitor your food intake and dietary pattern to see if any specific food is contributory -You can still continue Zofran as needed  Obesity -I have contacted Dr. Leafy Ro to see if any weight loss drugs can play a role to help you lose weight -Currently as you know this the main barrier towards a lung transplant  Follow-up -4 weeks from today do spirometry and DLCO -Return to clinic 4 weeks from today but 1 or 2 days before the next infusion     > 50% of this > 40 min visit spent in face to face counseling or/and coordination of care - by this undersigned MD - Dr Brand Males. This includes one or more of the following documented above: discussion of test results, diagnostic or treatment recommendations, prognosis, risks and benefits of management options, instructions, education, compliance or risk-factor reduction    SIGNATURE    Dr. Brand Males, M.D., F.C.C.P,  Pulmonary and Critical Care Medicine Staff Physician, [place] Director - Interstitial Lung Disease  Program  Pulmonary [place] [facility] [place], [place], [address]  Pager: [phone], If no answer or between  15:00h - 7:00h: call [phone] Telephone: [phone]  10:31 AM 03/02/2018

## 2018-03-02 NOTE — Telephone Encounter (Signed)
Tasha Ballard did well not gaining weight over the holidays and she will do even better journaling with specific calorie and macronutrient goals now that the holidays are over. Starting phentermine will not improve her metabolism; it is an appetite suppressant. She notes her appetite is decreased when she eats the proper nutrition, so what is the end goal? Remember, no weight loss medicine CAUSES weight loss, it just makes following the food plan easier. If she is following her eating plan well, no medicine is required. If she decreases her calories even lower it will damage her metabolism and cause nutrient deficiencies which will contribute to post-op complications and delayed healing. I hope this helps. Dennard Nip, MD

## 2018-03-02 NOTE — Telephone Encounter (Signed)
Patient scheduled 03/06/18 for infusion.  Has this been addressed?  Will route to [place] to follow up

## 2018-03-02 NOTE — Telephone Encounter (Signed)
TP has taken care of this this afternoon and spoken with Maudie Mercury at Short Stay Nothing further needed; will sign off

## 2018-03-03 ENCOUNTER — Ambulatory Visit: Payer: BLUE CROSS/BLUE SHIELD | Admitting: Adult Health

## 2018-03-06 ENCOUNTER — Encounter (HOSPITAL_COMMUNITY): Payer: Self-pay

## 2018-03-06 ENCOUNTER — Ambulatory Visit (HOSPITAL_COMMUNITY)
Admission: RE | Admit: 2018-03-06 | Discharge: 2018-03-06 | Disposition: A | Discharge status: Home / Self Care | Payer: BLUE CROSS/BLUE SHIELD | Source: Ambulatory Visit | Attending: Internal Medicine | Admitting: Internal Medicine

## 2018-03-06 DIAGNOSIS — J84112 Idiopathic pulmonary fibrosis: Secondary | ICD-10-CM | POA: Insufficient documentation

## 2018-03-06 LAB — CBC WITH DIFFERENTIAL/PLATELET
Abs Immature Granulocytes: 0.06 10*3/uL (ref 0.00–0.07)
Basophils Absolute: 0 10*3/uL (ref 0.0–0.1)
Basophils Relative: 0 %
EOS ABS: 0.3 10*3/uL (ref 0.0–0.5)
Eosinophils Relative: 2 %
HCT: 36.7 % (ref 36.0–46.0)
Hemoglobin: 10.8 g/dL — ABNORMAL LOW (ref 12.0–15.0)
Immature Granulocytes: 1 %
Lymphocytes Relative: 15 %
Lymphs Abs: 1.6 10*3/uL (ref 0.7–4.0)
MCH: 25.7 pg — ABNORMAL LOW (ref 26.0–34.0)
MCHC: 29.4 g/dL — ABNORMAL LOW (ref 30.0–36.0)
MCV: 87.2 fL (ref 80.0–100.0)
Monocytes Absolute: 0.8 10*3/uL (ref 0.1–1.0)
Monocytes Relative: 7 %
Neutro Abs: 7.9 10*3/uL — ABNORMAL HIGH (ref 1.7–7.7)
Neutrophils Relative %: 75 %
Platelets: 198 10*3/uL (ref 150–400)
RBC: 4.21 MIL/uL (ref 3.87–5.11)
RDW: 18.7 % — ABNORMAL HIGH (ref 11.5–15.5)
WBC: 10.6 10*3/uL — ABNORMAL HIGH (ref 4.0–10.5)
nRBC: 0 % (ref 0.0–0.2)

## 2018-03-06 LAB — COMPREHENSIVE METABOLIC PANEL
ALK PHOS: 54 U/L (ref 38–126)
ALT: 19 U/L (ref 0–44)
AST: 17 U/L (ref 15–41)
Albumin: 3.5 g/dL (ref 3.5–5.0)
Anion gap: 10 (ref 5–15)
BUN: 22 mg/dL — ABNORMAL HIGH (ref 6–20)
CALCIUM: 9.4 mg/dL (ref 8.9–10.3)
CO2: 33 mmol/L — ABNORMAL HIGH (ref 22–32)
Chloride: 95 mmol/L — ABNORMAL LOW (ref 98–111)
Creatinine, Ser: 0.8 mg/dL (ref 0.44–1.00)
GFR calc Af Amer: >60 mL/min (ref >60)
GFR calc non Af Amer: >60 mL/min (ref >60)
Glucose, Bld: 138 mg/dL — ABNORMAL HIGH (ref 70–99)
Potassium: 3.5 mmol/L (ref 3.5–5.1)
Sodium: 138 mmol/L (ref 135–145)
Total Bilirubin: 0.4 mg/dL (ref 0.3–1.2)
Total Protein: 6.2 g/dL — ABNORMAL LOW (ref 6.5–8.1)

## 2018-03-06 LAB — URINALYSIS, ROUTINE W REFLEX MICROSCOPIC
Bilirubin Urine: NEGATIVE
Glucose, UA: NEGATIVE mg/dL
Hgb urine dipstick: NEGATIVE
KETONES UR: NEGATIVE mg/dL
Leukocytes, UA: NEGATIVE
Nitrite: NEGATIVE
Protein, ur: NEGATIVE mg/dL
Specific Gravity, Urine: 1.016 (ref 1.005–1.030)
pH: 5 (ref 5.0–8.0)

## 2018-03-06 LAB — PHOSPHORUS: Phosphorus: 3.4 mg/dL (ref 2.5–4.6)

## 2018-03-06 LAB — MAGNESIUM: Magnesium: 2 mg/dL (ref 1.7–2.4)

## 2018-03-06 MED ORDER — ONDANSETRON HCL 4 MG PO TABS
4.0000 mg | ORAL_TABLET | Freq: Once | ORAL | Status: AC
  Administered 2018-03-06: 4 mg via ORAL
  Filled 2018-03-06: qty 1

## 2018-03-06 MED ORDER — DEXTROSE-NACL 5-0.45 % IV SOLN
INTRAVENOUS | Status: DC
  Administered 2018-03-06: 08:00:00 via INTRAVENOUS

## 2018-03-06 MED ORDER — ONDANSETRON HCL 8 MG PO TABS
8.0000 mg | ORAL_TABLET | ORAL | Status: DC
  Administered 2018-03-06: 8 mg via ORAL
  Filled 2018-03-06: qty 1

## 2018-03-06 MED ORDER — HEPARIN SOD (PORK) LOCK FLUSH 100 UNIT/ML IV SOLN
INTRAVENOUS | Status: AC
  Administered 2018-03-06: 500 [IU]
  Filled 2018-03-06: qty 5

## 2018-03-06 MED ORDER — DEXTROSE-NACL 5-0.45 % IV SOLN: INTRAVENOUS | Status: DC

## 2018-03-06 MED ORDER — SODIUM CHLORIDE 0.9 % IV SOLN
500.0000 mg | Freq: Once | INTRAVENOUS | Status: AC
  Administered 2018-03-06: 500 mg via INTRAVENOUS
  Filled 2018-03-06: qty 5

## 2018-03-06 MED ORDER — HEPARIN SOD (PORK) LOCK FLUSH 100 UNIT/ML IV SOLN
500.0000 [IU] | Freq: Once | INTRAVENOUS | Status: DC
  Filled 2018-03-06: qty 5

## 2018-03-06 MED ORDER — SODIUM CHLORIDE 0.9 % IV SOLN
2000.0000 mg | Freq: Once | INTRAVENOUS | Status: AC
  Administered 2018-03-06: 2000 mg via INTRAVENOUS
  Filled 2018-03-06: qty 100

## 2018-03-06 MED ORDER — HYDROCORTISONE NA SUCCINATE PF 100 MG IJ SOLR
100.0000 mg | INTRAMUSCULAR | Status: DC
  Administered 2018-03-06: 100 mg via INTRAVENOUS
  Filled 2018-03-06: qty 2

## 2018-03-06 MED ORDER — POTASSIUM CHLORIDE CRYS ER 20 MEQ PO TBCR
40.0000 meq | EXTENDED_RELEASE_TABLET | Freq: Once | ORAL | Status: AC
  Administered 2018-03-06: 40 meq via ORAL
  Filled 2018-03-06: qty 2

## 2018-03-10 ENCOUNTER — Other Ambulatory Visit (HOSPITAL_COMMUNITY): Payer: BLUE CROSS/BLUE SHIELD

## 2018-03-16 ENCOUNTER — Ambulatory Visit (INDEPENDENT_AMBULATORY_CARE_PROVIDER_SITE_OTHER): Payer: BLUE CROSS/BLUE SHIELD | Admitting: Family Medicine

## 2018-03-16 ENCOUNTER — Other Ambulatory Visit (INDEPENDENT_AMBULATORY_CARE_PROVIDER_SITE_OTHER): Payer: BLUE CROSS/BLUE SHIELD

## 2018-03-16 ENCOUNTER — Telehealth: Payer: Self-pay | Admitting: Internal Medicine

## 2018-03-16 ENCOUNTER — Encounter (INDEPENDENT_AMBULATORY_CARE_PROVIDER_SITE_OTHER): Payer: Self-pay

## 2018-03-16 DIAGNOSIS — J849 Interstitial pulmonary disease, unspecified: Secondary | ICD-10-CM | POA: Diagnosis not present

## 2018-03-16 NOTE — Telephone Encounter (Signed)
Spoke with the pt and notified of recs per MR  She verbalized understanding  Nothing further needed 

## 2018-03-16 NOTE — Telephone Encounter (Signed)
Tasha Ballard should come sometime this week and do cbc and bmet for post cytoxan labs    PAlso due to high cost of ofev she can hold it but let her know that we think that ofev makes a difference within a month of her starting it. So, she needs to think aboujt that while holding off due cost  I will see her next per schedule 03/30/2018

## 2018-03-17 ENCOUNTER — Telehealth: Payer: Self-pay | Admitting: Internal Medicine

## 2018-03-17 DIAGNOSIS — D72819 Decreased white blood cell count, unspecified: Secondary | ICD-10-CM

## 2018-03-17 LAB — BASIC METABOLIC PANEL
BUN: 15 mg/dL (ref 6–23)
CO2: 36 mEq/L — ABNORMAL HIGH (ref 19–32)
CREATININE: 0.91 mg/dL (ref 0.40–1.20)
Calcium: 9.6 mg/dL (ref 8.4–10.5)
Chloride: 94 mEq/L — ABNORMAL LOW (ref 96–112)
GFR: 65.55 mL/min (ref >60.00)
Glucose, Bld: 204 mg/dL — ABNORMAL HIGH (ref 70–99)
Potassium: 4.5 mEq/L (ref 3.5–5.1)
Sodium: 139 mEq/L (ref 135–145)

## 2018-03-17 LAB — CBC WITH DIFFERENTIAL/PLATELET
Basophils Absolute: 0 10*3/uL (ref 0.0–0.1)
Basophils Relative: 1 % (ref 0.0–3.0)
EOS ABS: 0.1 10*3/uL (ref 0.0–0.7)
Eosinophils Relative: 3.9 % (ref 0.0–5.0)
HCT: 34 % — ABNORMAL LOW (ref 36.0–46.0)
Hemoglobin: 10.8 g/dL — ABNORMAL LOW (ref 12.0–15.0)
Lymphocytes Relative: 15.9 % (ref 12.0–46.0)
Lymphs Abs: 0.3 10*3/uL — ABNORMAL LOW (ref 0.7–4.0)
MCHC: 31.8 g/dL (ref 30.0–36.0)
MCV: 82.2 fl (ref 78.0–100.0)
Monocytes Absolute: 0.1 10*3/uL (ref 0.1–1.0)
Monocytes Relative: 4.3 % (ref 3.0–12.0)
NEUTROS PCT: 74.9 % (ref 43.0–77.0)
Neutro Abs: 1.5 10*3/uL (ref 1.4–7.7)
PLATELETS: 281 10*3/uL (ref 150.0–400.0)
RBC: 4.13 Mil/uL (ref 3.87–5.11)
RDW: 20.2 % — ABNORMAL HIGH (ref 11.5–15.5)
WBC: 2 10*3/uL — ABNORMAL LOW (ref 4.0–10.5)

## 2018-03-17 NOTE — Telephone Encounter (Signed)
Called and spoke with patient. I let her know MR's rec. I placed order for lab work on 03/20/18 as patient states she will already be in [place].  Nothing further needed.

## 2018-03-17 NOTE — Telephone Encounter (Signed)
Let Tasha Ballard know that labs ok but wbc has dropped to 2,000 (two thousand). She needs to avoid sick folks.  Needs repeact cbc with diff 03/20/2018 or 03/21/2018  Any issues over weekend with illness low threshold to go to ER  I suspect tthough that wbc will start to recover starting 03/21/2018 or so      LABS    PULMONARY No results for input(s): PHART, PCO2ART, PO2ART, HCO3, TCO2, O2SAT in the last 168 hours.  Invalid input(s): PCO2, PO2  CBC Recent Labs  Lab 03/16/18 1621  HGB 10.8*  HCT 34.0*  WBC 2.0 Repeated and verified X2.*  PLT 281.0    COAGULATION No results for input(s): INR in the last 168 hours.  CARDIAC  No results for input(s): TROPONINI in the last 168 hours. No results for input(s): PROBNP in the last 168 hours.   CHEMISTRY Recent Labs  Lab 03/16/18 1621  NA 139  K 4.5  CL 94*  CO2 36*  GLUCOSE 204*  BUN 15  CREATININE 0.91  CALCIUM 9.6   Estimated Creatinine Clearance: 84.2 mL/min (by C-G formula based on SCr of 0.91 mg/dL).   LIVER No results for input(s): AST, ALT, ALKPHOS, BILITOT, PROT, ALBUMIN, INR in the last 168 hours.   INFECTIOUS No results for input(s): LATICACIDVEN, PROCALCITON in the last 168 hours.   ENDOCRINE CBG (last 3)  No results for input(s): GLUCAP in the last 72 hours.       IMAGING x48h  - image(s) personally visualized  -   highlighted in bold No results found.

## 2018-03-20 ENCOUNTER — Ambulatory Visit (INDEPENDENT_AMBULATORY_CARE_PROVIDER_SITE_OTHER): Payer: BLUE CROSS/BLUE SHIELD | Admitting: Pulmonary Disease

## 2018-03-20 ENCOUNTER — Encounter (INDEPENDENT_AMBULATORY_CARE_PROVIDER_SITE_OTHER): Payer: Self-pay

## 2018-03-20 ENCOUNTER — Telehealth: Payer: Self-pay | Admitting: Pulmonary Disease

## 2018-03-20 ENCOUNTER — Encounter: Payer: Self-pay | Admitting: Pulmonary Disease

## 2018-03-20 ENCOUNTER — Ambulatory Visit (INDEPENDENT_AMBULATORY_CARE_PROVIDER_SITE_OTHER): Payer: BLUE CROSS/BLUE SHIELD | Admitting: Physician Assistant

## 2018-03-20 VITALS — BP 102/74 | HR 97 | Temp 98.7°F | Ht 62.0 in | Wt 225.4 lb

## 2018-03-20 DIAGNOSIS — D72819 Decreased white blood cell count, unspecified: Secondary | ICD-10-CM | POA: Diagnosis not present

## 2018-03-20 DIAGNOSIS — J849 Interstitial pulmonary disease, unspecified: Secondary | ICD-10-CM | POA: Diagnosis not present

## 2018-03-20 DIAGNOSIS — R21 Rash and other nonspecific skin eruption: Secondary | ICD-10-CM

## 2018-03-20 LAB — CBC WITH DIFFERENTIAL/PLATELET
Basophils Absolute: 0 10*3/uL (ref 0.0–0.1)
Basophils Relative: 0.8 % (ref 0.0–3.0)
EOS PCT: 4.3 % (ref 0.0–5.0)
Eosinophils Absolute: 0.1 10*3/uL (ref 0.0–0.7)
HCT: 36.5 % (ref 36.0–46.0)
HEMOGLOBIN: 11.4 g/dL — AB (ref 12.0–15.0)
Lymphocytes Relative: 33.6 % (ref 12.0–46.0)
Lymphs Abs: 0.7 10*3/uL (ref 0.7–4.0)
MCHC: 31.1 g/dL (ref 30.0–36.0)
MCV: 82.9 fl (ref 78.0–100.0)
Monocytes Absolute: 0.6 10*3/uL (ref 0.1–1.0)
Monocytes Relative: 28.1 % — ABNORMAL HIGH (ref 3.0–12.0)
Neutro Abs: 0.7 10*3/uL — ABNORMAL LOW (ref 1.4–7.7)
Neutrophils Relative %: 33.2 % — ABNORMAL LOW (ref 43.0–77.0)
Platelets: 350 10*3/uL (ref 150.0–400.0)
RBC: 4.4 Mil/uL (ref 3.87–5.11)
RDW: 20.5 % — ABNORMAL HIGH (ref 11.5–15.5)
WBC: 2 10*3/uL — ABNORMAL LOW (ref 4.0–10.5)

## 2018-03-20 MED ORDER — HYDROCORTISONE 0.5 % EX OINT: 1.0000 "application " | TOPICAL_OINTMENT | Freq: Two times a day (BID) | CUTANEOUS | 0 refills | Status: DC

## 2018-03-20 NOTE — Assessment & Plan Note (Signed)
Plan:  Continue work-up with weight loss center

## 2018-03-20 NOTE — Assessment & Plan Note (Signed)
Assessment: Progressive NSIP diagnosed by [facility] Improvement since receiving IV Cytoxan New occurrence of red Malar rash Responding to doxycycline  Plan: Continue doxycycline Continue Bactrim as prescribed Continue oxygen Continue OFEV Continue plan for IV Cytoxan at this time Continue scheduled follow-up with Dr. [name] as planned

## 2018-03-20 NOTE — Assessment & Plan Note (Signed)
Plan: Complete blood work as planned today Follow-up blood work in 4 to 5 days

## 2018-03-20 NOTE — Assessment & Plan Note (Signed)
Assessment: New rash developed after second IV Cytoxan treatment Responsive to doxycycline Located on face  Plan: Continue doxycycline as prescribed Continue Bactrim as prescribed Hydrocortisone cream as needed for symptom relief Referral to dermatology for further management and work-up if symptoms do not improve Keep follow-up with Dr. [name]

## 2018-03-20 NOTE — Progress Notes (Signed)
_0  ID: Tasha Ballard, female    DOB: 1970/04/06, 48 y.o.   MRN: [id number]  Chief Complaint  Patient presents with  . Acute Visit    Suspected drug reaction, rash    Referring provider: [name], MD  HPI:  48 year old female followed in our office for interstitial lung disease/progressive NSIP, chronic respiratory failure, high risk drug treatment (chronic prednisone, Bactrim prophylaxis, Cytoxan, OFEV)  PMH: Morbid obesity (currently evaluated by Dr. Leafy Ballard at the weight loss clinic) Smoker/ Smoking History:  Maintenance: Cytoxan, chronic prednisone, Bactrim prophylaxis, OFEV Pt of: Dr. [name]   03/20/2018  - Visit   48 year old female never smoker presenting to our office today for an acute visit.  Patient reports that on 03/18/2018 symptoms of a hot facial rash started with pruritic small pustules.  Right along the line of her nasal cannula.  Patient reported that immediately that day she contacted primary care as well as Dr. [name] regarding the rash.  She sent pictures of the rash to Dr. [name].  Patient was maintained on Bactrim but also started on doxycycline for treatment.  Since starting treatment the rash has subsided slightly but is still present today.  There also are 2 more raised areas on patient's right ear and hairline.  Patient also reports an area underneath her nose where her nasal cannula is.  And a few areas on the back of her head.  Patient has taken Benadryl which did seem to help with the pruritus.  She has not taken any today.  Patient had to present to our office today anyway for lab work.  Patient is received 2 IV infusions of Cytoxan.  Patient has had significant improvement to her respiratory status since starting IV Cytoxan.    Tests:  -November 09, 2017: Echocardiogram ejection fraction 70-75% with grade 2 diastolic dysfunction.  Right ventricle was mildly enlarged but global RV function was felt to be normal.  No pericardial  effusion no obvious right-to-left shunt at rest or cough or Valsalva on agitated saline contrast exam and stage I right ventricular diastolic dysfunction.  Pulmonary artery systolic pressure at 45 mmHg  Lung function November 08, 2017: FVC 0.9 L / 27%, DLCO 5.86/29%  HRCT 11/08/17 Rt diaphragm elevation ? Eventration v paresis. Sniff test recommendd  Esophagogram November 11, 2017 - > 11/11/17 Tasha Ballard - Normal  01/02/2018- split-night sleep study- negative for OSA   11/09/2017-2D echocardiogram, left ventricular systolic function is hyperdynamic, LV EF 70 to 75% and stage II diastolic dysfunction, right ventricular size is mildly enlarged, right ventricular systolic pressure is 45, very poor image quality,  FENO:  No results found for: NITRICOXIDE  PFT: No flowsheet data found.  Imaging: No results found.    Specialty Problems      Pulmonary Problems   Interstitial pulmonary disease ([place])    10/26/2011 right VATS lung biopsy  Diagnosis SPT A. "RIGHT UPPER LOBE" (WEDGE BIOPSY OF LUNG):  PULMONARY TISSUE WITH DIFFUSE INTERSTITIAL FIBROSIS AND ASSOCIATED EXTENSIVE DESQUAMATIVE INTERSTITIAL PNEUMONIA (DIP) PATTERN OF ALVEOLAR MACROPHAGE ACCUMULATIONS. SEE COMMENT.  B. "RIGHT MIDDLE LOBE" (WEDGE BIOPSY OF LUNG):  PULMONARY TISSUE WITH DIFFUSE INTERSTITIAL FIBROSIS AND ASSOCIATED EXTENSIVE DESQUAMATIVE INTERSTITIAL PNEUMONIA (DIP) PATTERN OF ALVEOLAR MACROPHAGE ACCUMULATION. SEE COMMENT.  COMMENT: Sections of the biopsy specimens demonstrate similar findings. There is a diffuse and uniform pattern of interstitial fibrosis without temporal variegation. A focus of honeycombing is identified, however this is not consistent throughout the biopsy. This pattern is most consistent with the fibrosing variant  of Non-Specific Interstitial Pneumonia (NSIP). The interstitial fibrotic changes are more diffuse and severe than the "DIP-like" alveolar macrophage accumulations; a number  of interstitial pneumonitides may feature such. Overview:  10/26/2011 right VATS lung biopsy  Diagnosis SPT A. "RIGHT UPPER LOBE" (WEDGE BIOPSY OF LUNG):  PULMONARY TISSUE WITH DIFFUSE INTERSTITIAL FIBROSIS AND ASSOCIATED EXTENSIVE DESQUAMATIVE INTERSTITIAL PNEUMONIA (DIP) PATTERN OF ALVEOLAR MACROPHAGE ACCUMULATIONS. SEE COMMENT.  B. "RIGHT MIDDLE LOBE" (WEDGE BIOPSY OF LUNG):  PULMONARY TISSUE WITH DIFFUSE INTERSTITIAL FIBROSIS AND ASSOCIATED EXTENSIVE DESQUAMATIVE INTERSTITIAL PNEUMONIA (DIP) PATTERN OF ALVEOLAR MACROPHAGE ACCUMULATION. SEE COMMENT.  COMMENT: Sections of the biopsy specimens demonstrate similar findings. There is a diffuse and uniform pattern of interstitial fibrosis without temporal variegation. A focus of honeycombing is identified, however this is not consistent throughout the biopsy. This pattern is most consistent with the fibrosing variant of Non-Specific Interstitial Pneumonia (NSIP). The interstitial fibrotic changes are more diffuse and severe than the "DIP-like" alveolar macrophage accumulations; a number of interstitial pneumonitides may feature such. 10/26/2011 right VATS lung biopsy  Diagnosis SPT A. "RIGHT UPPER LOBE" (WEDGE BIOPSY OF LUNG):  PULMONARY TISSUE WITH DIFFUSE INTERSTITIAL FIBROSIS AND ASSOCIATED EXTENSIVE DESQUAMATIVE INTERSTITIAL PNEUMONIA (DIP) PATTERN OF ALVEOLAR MACROPHAGE ACCUMULATIONS. SEE COMMENT.  B. "RIGHT MIDDLE LOBE" (WEDGE BIOPSY OF LUNG):  PULMONARY TISSUE WITH DIFFUSE INTERSTITIAL FIBROSIS AND ASSOCIATED EXTENSIVE DESQUAMATIVE INTERSTITIAL PNEUMONIA (DIP) PATTERN OF ALVEOLAR MACROPHAGE ACCUMULATION. SEE COMMENT.  COMMENT: Sections of the biopsy specimens demonstrate similar findings. There is a diffuse and uniform pattern of interstitial fibrosis without temporal variegation. A focus of honeycombing is identified, however this is not consistent throughout the biopsy. This pattern is most consistent with  the fibrosing variant of Non-Specific Interstitial Pneumonia (NSIP). The interstitial fibrotic changes are more diffuse and severe than the "DIP-like" alveolar macrophage accumulations; a number of interstitial pneumonitides may feature such. 10/26/2011 right VATS lung biopsy  Diagnosis SPT A. "RIGHT UPPER LOBE" (WEDGE BIOPSY OF LUNG):  PULMONARY TISSUE WITH DIFFUSE INTERSTITIAL FIBROSIS AND ASSOCIATED EXTENSIVE DESQUAMATIVE INTERSTITIAL PNEUMONIA (DIP) PATTERN OF ALVEOLAR MACROPHAGE ACCUMULATIONS. SEE COMMENT.  B. "RIGHT MIDDLE LOBE" (WEDGE BIOPSY OF LUNG):  PULMONARY TISSUE WITH DIFFUSE INTERSTITIAL FIBROSIS AND ASSOCIATED EXTENSIVE DESQUAMATIVE INTERSTITIAL PNEUMONIA (DIP) PATTERN OF ALVEOLAR MACROPHAGE ACCUMULATION. SEE COMMENT.  COMMENT: Sections of the biopsy specimens demonstrate similar findings. There is a diffuse and uniform pattern of interstitial fibrosis without temporal variegation. A focus of honeycombing is identified, however this is not consistent throughout the biopsy. This pattern is most consistent with the fibrosing variant of Non-Specific Interstitial Pneumonia (NSIP). The interstitial fibrotic changes are more diffuse and severe than the "DIP-like" alveolar macrophage accumulations; a number of interstitial pneumonitides may feature such.      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 Macrobid [Nitrofurantoin Macrocrystal] Nausea And Vomiting  . Adhesive [Tape] Rash    Immunization History  Administered Date(s) Administered  . Influenza,inj,Quad PF,6+ Mos 12/24/2014, 02/05/2016, 01/07/2017, 11/22/2017  . Pneumococcal Conjugate-13 07/20/2017  . Pneumococcal Polysaccharide-23 06/09/2012     Past Medical History:  Diagnosis Date  . Aneurysm ([place])   . Back pain   . Diabetes mellitus without complication ([place])   . H/O blood clots   . Hypertension   . Joint pain   . Pulmonary  fibrosis ([place])   . SOB (shortness of breath)   . Swelling   . Thyroid disease   . Vitamin D deficiency     Tobacco History: Social History   Tobacco Use  Smoking Status Never Smoker  Smokeless Tobacco Never Used   Counseling given: Yes Continue to not smoke  Outpatient Encounter Medications as of 03/20/2018  Medication Sig  . apixaban (ELIQUIS) 5 MG TABS tablet Take 5 mg by mouth 2 (two) times daily.  . benzonatate (TESSALON) 100 MG capsule Take 100 mg by mouth 3 (three) times daily as needed for cough.   . carvedilol (COREG) 6.25 MG tablet Take 6.25 mg by mouth 2 (two) times daily with a meal.  . cholecalciferol (VITAMIN D) 25 MCG (1000 UT) tablet Take 1,000 Units by mouth daily.  Marland Kitchen doxycycline (VIBRA-TABS) 100 MG tablet Take 1 tablet by mouth 2 (two) times daily.  . furosemide (LASIX) 40 MG tablet Take 40 mg by mouth See admin instructions. Take 1 tablet (40 mg) daily, may repeat dose if needed for fluid retention.  . metFORMIN (GLUCOPHAGE) 500 MG tablet Take 500 mg by mouth 2 (two) times daily.   . montelukast (SINGULAIR) 10 MG tablet Take 10 mg by mouth daily after supper.  Marland Kitchen OFEV 150 MG CAPS Take 150 mg by mouth 2 (two) times daily.  . ondansetron (ZOFRAN) 8 MG tablet Take 1 tablet (8 mg total) by mouth every 8 (eight) hours as needed for nausea or vomiting.  . potassium chloride (K-DUR) 10 MEQ tablet Take 10 mEq by mouth 2 (two) times daily.   . predniSONE (DELTASONE) 10 MG tablet Take 10-37m as needed for SOB (Patient taking differently: Take 10 mg by mouth daily with breakfast. )  . PROAIR HFA 108 (90 Base) MCG/ACT inhaler Inhale 2 puffs into the lungs every 6 (six) hours as needed for wheezing or shortness of breath.  . spironolactone (ALDACTONE) 100 MG tablet Take 100 mg by mouth  daily.  .Marland Kitchensulfamethoxazole-trimethoprim (BACTRIM) 400-80 MG tablet Once a day while on prednisone (Patient taking differently: Take 1 tablet by mouth every evening. )  . thyroid (ARMOUR) 90 MG tablet Take 90 mg by mouth daily before breakfast.   . Vitamin D, Ergocalciferol, (DRISDOL) 1.25 MG (50000 UT) CAPS capsule Take 1 capsule (50,000 Units total) by mouth every 7 (seven) days.  . hydrocortisone ointment 0.5 % Apply 1 application topically 2 (two) times daily.   No facility-administered encounter medications on file as of 03/20/2018.      Review of Systems  Review of Systems  Constitutional: Positive for diaphoresis. Negative for chills, fatigue, fever and unexpected weight change.  HENT: Positive for nosebleeds (Irritated nasal passages). Negative for congestion, ear pain, postnasal drip, sinus pressure and sinus pain.   Respiratory: Negative for cough, chest tightness, shortness of breath and wheezing.   Cardiovascular: Negative for chest pain and palpitations.  Gastrointestinal: Negative for diarrhea, nausea and vomiting.  Skin: Positive for color change and rash.       + Predominant facial rash  Allergic/Immunologic: Negative for environmental allergies and food allergies.  Neurological: Negative for dizziness, light-headedness and headaches.  Psychiatric/Behavioral: Negative for dysphoric mood. The patient is not nervous/anxious.   All other systems reviewed and are negative.    Physical Exam  BP 102/74 (BP Location: Left Arm, Cuff Size: Normal)   Pulse 97   Temp 98.7 F (37.1 C) (Oral)   Ht 5' 2" (1.575 m)   Wt 225 lb 6.4 oz (  102.2 kg)   SpO2 94%   BMI 41.23 kg/m   Wt Readings from Last 5 Encounters:  03/20/18 225 lb 6.4 oz (102.2 kg)  03/06/18 227 lb 2 oz (103 kg)  03/02/18 222 lb 6.4 oz (100.9 kg)  02/28/18 220 lb (99.8 kg)  02/13/18 224 lb (101.6 kg)     Physical Exam  Constitutional: She is well-developed, well-nourished, and in no distress. No distress.   HENT:  Head: Normocephalic and atraumatic.    Right Ear: Hearing, tympanic membrane, external ear and ear canal normal.  Left Ear: Hearing, tympanic membrane, external ear and ear canal normal.  Nose:    Mouth/Throat: Oropharynx is clear and moist. No oral lesions. Normal dentition. No dental abscesses or dental caries. No oropharyngeal exudate.  +multiple healing clots in bilateral nares  No sites of irritation on lips or in mouth.  No open sores in mouth.   Cardiovascular: Normal rate, regular rhythm and normal heart sounds.  Pulmonary/Chest: Effort normal. No respiratory distress. She has no wheezes.  + Noisy chest  Skin: She is not diaphoretic.     Nursing note and vitals reviewed.     Lab Results:  CBC    Component Value Date/Time   WBC 2.0 Repeated and verified X2. (L) 03/20/2018 1149   RBC 4.40 03/20/2018 1149   HGB 11.4 (L) 03/20/2018 1149   HGB 11.1 02/13/2018 1118   HCT 36.5 03/20/2018 1149   HCT 37.2 02/13/2018 1118   PLT 350.0 03/20/2018 1149   MCV 82.9 03/20/2018 1149   MCV 81 02/13/2018 1118   MCH 25.7 (L) 03/06/2018 0806   MCHC 31.1 03/20/2018 1149   RDW 20.5 (H) 03/20/2018 1149   RDW 15.6 (H) 02/13/2018 1118   LYMPHSABS 0.7 03/20/2018 1149   LYMPHSABS 0.8 02/13/2018 1118   MONOABS 0.6 03/20/2018 1149   EOSABS 0.1 03/20/2018 1149   EOSABS 0.2 02/13/2018 1118   BASOSABS 0.0 03/20/2018 1149   BASOSABS 0.1 02/13/2018 1118    BMET    Component Value Date/Time   NA 139 03/16/2018 1621   NA 136 02/13/2018 1118   K 4.5 03/16/2018 1621   CL 94 (L) 03/16/2018 1621   CO2 36 (H) 03/16/2018 1621   GLUCOSE 204 (H) 03/16/2018 1621   BUN 15 03/16/2018 1621   BUN 11 02/13/2018 1118   CREATININE 0.91 03/16/2018 1621   CALCIUM 9.6 03/16/2018 1621   GFRNONAA >60 03/06/2018 0806   GFRAA >60 03/06/2018 0806    BNP No results found for: BNP  ProBNP No results found for: PROBNP    Assessment & Plan:     ILD (interstitial lung disease)  ([place]) Assessment: Progressive NSIP diagnosed by [facility] Improvement since receiving IV Cytoxan New occurrence of red Malar rash Responding to doxycycline  Plan: Continue doxycycline Continue Bactrim as prescribed Continue oxygen Continue OFEV Continue plan for IV Cytoxan at this time Continue scheduled follow-up with Dr. [name] as planned  Rash Assessment: New rash developed after second IV Cytoxan treatment Responsive to doxycycline Located on face  Plan: Continue doxycycline as prescribed Continue Bactrim as prescribed Hydrocortisone cream as needed for symptom relief Referral to dermatology for further management and work-up if symptoms do not improve Keep follow-up with Dr. [name]  Leukopenia Plan: Complete blood work as planned today Follow-up blood work in 4 to 5 days  Morbid obesity ([place]) Plan:  Continue work-up with weight loss center     Tasha Rinne, NP 03/20/2018   This appointment was  35 min long with over 50% of the time in direct face-to-face patient care, assessment, plan of care, and follow-up.

## 2018-03-20 NOTE — Telephone Encounter (Signed)
Records being faxed for the 2 nd time this was also flagged in the proficient referral

## 2018-03-20 NOTE — Patient Instructions (Signed)
Complete lab work today as planned  Keep follow-up with our office in February/07/2018  Hydrocortisone cream prescribed today >>> Can apply it 1-2 times a day to rash  Continue doxycycline as prescribed Continue Bactrim as prescribed  Follow-up in 1 week with our office via telephone to update [name] on your symptoms  Continue other medications as prescribed  Referred to dermatology for further evaluation if symptoms do not improve  It is flu season:   >>>Remember to be washing your hands regularly, using hand sanitizer, be careful to use around herself with has contact with people who are sick will increase her chances of getting sick yourself. >>> Best ways to protect herself from the flu: Receive the yearly flu vaccine, practice good hand hygiene washing with soap and also using hand sanitizer when available, eat a nutritious meals, get adequate rest, hydrate appropriately   Please contact the office if your symptoms worsen or you have concerns that you are not improving.   Thank you for choosing [facility] for your healthcare, and for allowing [name] to partner with you on your healthcare journey. I am thankful to be able to provide care to you today.   Wyn Quaker FNP-C

## 2018-03-23 ENCOUNTER — Other Ambulatory Visit (INDEPENDENT_AMBULATORY_CARE_PROVIDER_SITE_OTHER): Payer: BLUE CROSS/BLUE SHIELD

## 2018-03-23 DIAGNOSIS — D72819 Decreased white blood cell count, unspecified: Secondary | ICD-10-CM

## 2018-03-23 LAB — CBC WITH DIFFERENTIAL/PLATELET
Basophils Absolute: 0 10*3/uL (ref 0.0–0.1)
Basophils Relative: 0.7 % (ref 0.0–3.0)
Eosinophils Absolute: 0.1 10*3/uL (ref 0.0–0.7)
Eosinophils Relative: 2.4 % (ref 0.0–5.0)
HCT: 37.6 % (ref 36.0–46.0)
Hemoglobin: 11.8 g/dL — ABNORMAL LOW (ref 12.0–15.0)
Lymphocytes Relative: 18.6 % (ref 12.0–46.0)
Lymphs Abs: 1 10*3/uL (ref 0.7–4.0)
MCHC: 31.3 g/dL (ref 30.0–36.0)
MCV: 83.3 fl (ref 78.0–100.0)
MONOS PCT: 15.7 % — AB (ref 3.0–12.0)
Monocytes Absolute: 0.8 10*3/uL (ref 0.1–1.0)
Neutro Abs: 3.3 10*3/uL (ref 1.4–7.7)
Neutrophils Relative %: 62.6 % (ref 43.0–77.0)
Platelets: 328 10*3/uL (ref 150.0–400.0)
RBC: 4.51 Mil/uL (ref 3.87–5.11)
RDW: 20.3 % — ABNORMAL HIGH (ref 11.5–15.5)
WBC: 5.3 10*3/uL (ref 4.0–10.5)

## 2018-03-23 NOTE — Progress Notes (Signed)
Can we check on the patient as well and see how she is doing with her facial rash. Aaron Edelman

## 2018-03-23 NOTE — Progress Notes (Signed)
Improving lab work. Keep follow up with Dr. Madelaine Etienne FNP

## 2018-03-23 NOTE — Progress Notes (Signed)
Okay please route this note to Dr. [name] as he will be the one that has to release that blood work.  Aaron Edelman

## 2018-03-24 ENCOUNTER — Ambulatory Visit: Payer: BLUE CROSS/BLUE SHIELD | Admitting: Pulmonary Disease

## 2018-03-29 ENCOUNTER — Telehealth: Payer: Self-pay | Admitting: Internal Medicine

## 2018-03-29 NOTE — Telephone Encounter (Signed)
There is not an opening on the schedule.  Advised I would put message back to check with nurse/MR what he would like her to do.

## 2018-03-29 NOTE — Telephone Encounter (Signed)
145 appt was open, so her 130 appointment is now 30 minutes with PFT at 12pm.  Pt is aware.  Nothing further is needed.

## 2018-03-30 ENCOUNTER — Ambulatory Visit (INDEPENDENT_AMBULATORY_CARE_PROVIDER_SITE_OTHER): Payer: BLUE CROSS/BLUE SHIELD | Admitting: Internal Medicine

## 2018-03-30 ENCOUNTER — Telehealth: Payer: Self-pay | Admitting: Internal Medicine

## 2018-03-30 ENCOUNTER — Encounter: Payer: Self-pay | Admitting: Internal Medicine

## 2018-03-30 VITALS — BP 128/72 | HR 88 | Ht 62.0 in | Wt 220.0 lb

## 2018-03-30 DIAGNOSIS — Z79899 Other long term (current) drug therapy: Secondary | ICD-10-CM | POA: Diagnosis not present

## 2018-03-30 DIAGNOSIS — J849 Interstitial pulmonary disease, unspecified: Secondary | ICD-10-CM | POA: Diagnosis not present

## 2018-03-30 DIAGNOSIS — R21 Rash and other nonspecific skin eruption: Secondary | ICD-10-CM

## 2018-03-30 DIAGNOSIS — R112 Nausea with vomiting, unspecified: Secondary | ICD-10-CM

## 2018-03-30 DIAGNOSIS — J9611 Chronic respiratory failure with hypoxia: Secondary | ICD-10-CM

## 2018-03-30 DIAGNOSIS — T50905A Adverse effect of unspecified drugs, medicaments and biological substances, initial encounter: Secondary | ICD-10-CM

## 2018-03-30 DIAGNOSIS — J8489 Other specified interstitial pulmonary diseases: Secondary | ICD-10-CM | POA: Diagnosis not present

## 2018-03-30 LAB — PULMONARY FUNCTION TEST
DL/VA % pred: 92 %
DL/VA: 4.06 ml/min/mmHg/L
DLCO unc % pred: 34 %
DLCO unc: 6.85 ml/min/mmHg
FEF 25-75 Pre: 1.82 L/sec
FEF2575-%Pred-Pre: 67 %
FEV1-%Pred-Pre: 38 %
FEV1-Pre: 1.01 L
FEV1FVC-%Pred-Pre: 116 %
FEV6-%Pred-Pre: 33 %
FEV6-Pre: 1.09 L
FEV6FVC-%Pred-Pre: 102 %
FVC-%Pred-Pre: 32 %
FVC-Pre: 1.09 L
Pre FEV1/FVC ratio: 93 %
Pre FEV6/FVC Ratio: 100 %

## 2018-03-30 MED ORDER — POTASSIUM CHLORIDE ER 10 MEQ PO TBCR: 10.0000 meq | EXTENDED_RELEASE_TABLET | Freq: Two times a day (BID) | ORAL | 2 refills | Status: DC

## 2018-03-30 NOTE — Telephone Encounter (Signed)
Okay : all you have to do is call Short stay at [facility] , confirm set up . Order labs for that day and  Make sure they have protocol sheet .  Very simple  I will be back in office at [facility] tomorrow can go over if need help

## 2018-03-30 NOTE — Progress Notes (Signed)
PFT completed today.

## 2018-03-30 NOTE — Patient Instructions (Addendum)
ILD (interstitial lung disease) (HCC) NSIP (nonspecific interstitial pneumonia) (HCC) Chronic respiratory failure with hypoxia ([place]) Encounter for long-term current use of high risk medication  - lung function stabilized/improved with ofev/cytoxan  - continue ofev daily - ok for cycle #3 cytoxan on 04/03/2018 (will do labs and orders on morning of)  - will do full dose - continue baseline prednisone and 3x/week of bactrim - continue o2 4L at rest, 8L with exertion   Drug-induced nausea and vomiting  - due to ofev - glad you are managing this with zofran and dietary change  Morbid obesity ([place])  - glad you are losing weight - continue good work  Rash  - improved  - will be [place] to see what Dermatologist says   Followup  - 4 weeks to ILD clinic - which is 2 weeks post cycle #3 of cytoxan

## 2018-03-30 NOTE — Progress Notes (Signed)
-Lung function November 08, 2017: FVC 0.9 L / 27%, DLCO 5.86/29%  - HRCT 11/08/17 Rt diaphragm elevation ? Eventration v paresis.  Esophagogram November 11, 2017 - > 11/11/17 Tasha Ballard - Normal  Dr. Threasa Alpha at [facility].She was started on  IOV 11/22/2017  Chief Complaint  Patient presents with  . Consult    consut due to pulmonary fibrosis. Pt has been followed by [facility] and last PFT was peformed 8/14 at [facility]. Pt does have complaints of SOB with exertion which she states has become better due to rehab. Pt also has a dry hacking cough with mild production of mucus. Denies any CP.    Is seen by presents to the ILD clinic.  This is her first visit with [name].  History is obtained from her and review of the [place] where she is to follow-up with Dr. Hortencia Pilar.  Interstitial lung disease clinic integrated interstitial lung disease questionnaire: Symptoms: Shortness of breath for years.  Gradually worse.  Currently level 5 shortness of breath walking up stairs or walking up a hill with oxygen.  Level for shortness of breath for sweeping or vacuuming and level 3 shortness of breath for shopping or picking up things and doing laundry.  Level 3 shortness of breath or standing up from a chair or taking a shower making the bed and level 1 dyspnea for brushing teeth.  She also has a cough for 6 years which is getting worse.  On and off that is phlegm.  Occasional wheezing present there is associated fatigue.  She tells me that she had undiagnosed and unrecognized ILD for years.  This was then picked up at [facility] and then she got referred to [facility].  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  At this time she was placed on CellCept.  Then in 2016 she got somewhat worse and was placed on 2 L nasal cannula continuously.  In 2017 she tells me that she had a aortic aneurysm and this  was repaired at [facility].  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  And then for the last year and a half or so she has been on significantly worsening hypoxemia.  In the spring 2019 she is requiring 8 L at rest and 15 L at exertion.  Apparently Dr. Lauris Chroman then recommended Rituxan infusions which she got to 60 days apart with the last one being approximately in April of May 2019.  After that she is somewhat improved requiring only 9-10 L with exertion.  She has been attending pulmonary rehabilitation which has been helping.  She is obese and unable to lose weight although she is lost some.  She believes a lot of her oxygen needs might be related to deviated nasal septum/septal perforation.  She subsequently followed up with Dr. Ruthann Cancer at [facility] in the summer 2019.  I reviewed the notes.  Palliative care was recommended because of progressive respiratory failure.  She has never seen the transplant clinic at [facility] but she recommends that because of obesity and diaphragmatic dysfunction that she would not be a good candidate for transplant .  She was then frustrated with experience because she wants to fight the disease.  She then drove to [facility] and saw Dr. Elisabeth Cara at [facility] 1 week ago.  She spent substantial  number of days there.  At this point in time she showed me an email from him that suggest he might have myositis-ILD but they would like to review her CT scans and pathology from [facility].  Apparently she had significant amount of blood work and other tests at [facility]  I do not have access to these records at this point.  She believes that she wants to undergo transplant.  She thinks that she would be able to have transplant in [place] despite a high BMI.  Currently in [place] she is somewhat stable attending pulmonary rehabilitation.  She is interested in pulmonary trials  Past  medical history  -Tested for sleep apnea at [facility] and was told to be negative.  But at [facility] in September 2019 they recommended a repeat sleep study -Denied all forms of connective tissue disease diagnosis and vasculitis.  Although at [facility] September 2019 when she was seen last week she was told she might have myositis-ILD but this is pending confirmation in the multidisciplinary case conference -She does report positive for diabetes and thyroid disease and mononucleosis in the past but denies any tuberculosis or kidney disease   Review of systems -Positive for fatigue for a few years, arthralgia for a few years but denies dysphagia.  Positive for dry eyes and mouth for few years.  No Raynaud.  No recurrent fever.  No weight loss.  No acid reflux but does admit to snoring for a few years.  No rash no ulcers.  Family history of lung disease COPD and a long but no pulmonary fibrosis.  Dad did have rheumatoid arthritis  Personal exposure history Denies tobacco, vaping, marijuana, cocaine, IV drugs  Home and hobby history  [place] which is 50 years old  Occupational history -She does stay in a condition spaces.  122 point occupational questionnaire completely negative  Medication history -She has a history of allergy to Macrobid/nitrofurantoin.  Do not know when she took it.  She is currently on immunosuppressive regimen of CellCept, Bactrim and prednisone.  CellCept since 2013 and prednisone since 2012.  She did get Rituxan x2 doses with the last one being in spring 2019.  She feels her hypoxemia improved somewhat after that.     has a past medical history of Aneurysm ([place]), Diabetes mellitus without complication ([place]), and Thyroid disease.   reports that she has never smoked. She has never used smokeless tobacco.   OV 12/21/2017  Subjective:  Patient ID: Tasha Ballard, female , DOB: 1970-11-16 , age 48 y.o. , MRN: [id number] ,  ADDRESS: [address] [place] [address]   12/21/2017 -   Chief Complaint  Patient presents with  . Follow-up    ILD     HPI PASQUALINA COLASURDO 48 y.o. -returns for follow-up.  She brings her mother along who I am meeting for the first time.  This visit is specifically focused on evaluation from [place].  Yesterday I did speak to the interstitial lung disease specialist at [facility] Dr. Threasa Alpha.  He told me he was finally able to receive and review the surgical lung biopsy from [facility].  He and his pathologist agreed with the diagnosis of progressive NSIP.  His main recommendation was to switch patient to Cytoxan.  In addition also aggressively pursue weight loss even if it means for bariatric surgery approach so we can get ready for transplant.  He agreed the prognosis is very  poor.  At this point in time patient compared to the last visit is stable.  She is using more than 10 L of oxygen up to 15 L of oxygen.  She is interested in Cytoxan.  Dr. Elisabeth Cara indicated to me that efficacy wise oral and IV are similar but he felt IV would be a little bit safer from a urologic standpoint.  Patient is interested in weight loss.  She says she has had difficulty losing weight despite being hypocaloric.  She is interested in nutritional referral.  In addition she wants a sleep specialist referral.  She says [place] OSA was ruled out.  She has significant insomnia and daytime tiredness.  She said that [place] recommended sleep referral ASAP.  She is registered to participate in the ILD-pro registry study for progressive non-IPF disease.  This was done today.   Investigations at [facility] -November 09, 2017: Echocardiogram ejection fraction 70-75% with grade 2 diastolic dysfunction.  Right ventricle was mildly enlarged but global RV function was felt to be normal.  No pericardial effusion no obvious right-to-left shunt at rest or cough or Valsalva on agitated  saline contrast exam and stage I right ventricular diastolic dysfunction.  Pulmonary artery systolic pressure at 45 mmHg  -Lung function November 08, 2017: FVC 0.9 L / 27%, DLCO 5.86/29%  - HRCT 11/08/17 Rt diaphragm elevation ? Eventration v paresis. Sniff test recommendd  Esophagogram November 11, 2017 - > 11/11/17 Natil Jewsh - Normal    12/22/2017-telephone encounter - Dr. [name] >>>starting Cytoxan 50 mg/day >>>Avoid lots of fluids to avoid cystitis, avoid intercourse without contraception, return in 7 to 10 days to recheck CBC, Bmet, LFTs, UA  12/22/17-office visit-[place] >>>Plan in lab sleep study be ordered, patient will likely need BiPAP with oxygen  12/30/2017  - Visit   48 year old female patient presenting today for one-week follow-up visit after starting Cytoxan.  Patient also get lab work completed today.  Unfortunately patient did not get this completed prior to her office visit she will get afterwards.  Patient reports baseline symptoms of nonproductive cough.  Requiring 4 to 6 L of oxygen at rest as well as 10 to 15 L of oxygen with exertion.  Patient does have her baseline shortness of breath with exertion.  Patient reporting no worsening symptoms since starting Cytoxan.   Patient reports she typically is on 9 to 10 L of oxygen at home.   OV 01/17/2018  Subjective:  Patient ID: Tasha Ballard, female , DOB: Sep 06, 1970 , age 68 y.o. , MRN: [id number] , ADDRESS: [address] [place] [address]   01/17/2018 -   Chief Complaint  Patient presents with  . Follow-up    2 week f/u, lung transplant, was contacted and waiting on weight loss to continue   PRogressive NSIP Chronic hypoxemic resp failure Morbid Obesity - needs weight loss for transplant Rx regimen   -  cytoxan 54m per day 12/23/17 and increaed to 1083mon 12/23/17.On11/19/19. increase to 12565mer day  And switched to IV cytoxan Q4 week cycle - 1st cycle 02/03/18.   HPI ChrTALER KUSHNER 64.o. -presents for follow-up with her husband.  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 was started on cytoxan 77m46mr day 12/23/17 and increaed to 100mg64m11/1/19.  On 01/10/18.  increase to 125mg 73mday .  Plan is to get blood work  today.  She wants a Port-A-Cath placed.  She prefers IV Cytoxan.  I checked with Dr. Threasa Alpha and he feels either p.o. or IV Cytoxan is fine but in the balance IV Cytoxan has a slightly lower incidence of hemorrhagic cystitis and the patient prefers this.  At this point in time she tells me that the Cytoxan seems to be working well for her.  She is less hypoxemic she is able to get by with 5 L at rest.  On the other hand this could be because of weight loss.  However there is some fatigue.  She will have blood work and urine analysis to look for cytotoxicity from Cytoxan.  Based on the newINBUILD  data we discussed about starting nintedanib.  I checked with Dr. Threasa Alpha at [facility] and he is supportive of the plan.  Patient states that she has done extensive research on nintedanib and she is very excited about the possibility of taking this.   Obesity: Weight 231#  11/22/17  -> 221# 01/17/2018.,  She saw the nutritionist at [facility].  She is using an app and tracking her weight on a regular basis.  She is encouraged by the weight loss.  We gave her a low glycemic diet sheet.  She has not been able to see the bariatric surgeon at [facility] surgery but I did run into Dr. Kaylyn Lim in the hallway and he said he would be happy to see her and counsel her.  Although he did acknowledge she would be high risk for surgery.  He is in favor of low carbohydrate diet.  A few weeks ago I met the transplant physician from [facility] and he brought up the idea about losing weight loss drugs.  His preferred drug is phentermine.  We discussed the  possibility about getting an endocrine consult  Lung transplant: [facility] has [facility] her.  They want to BMI less than 30 or 36 I am not sure.  She is working towards his goal.  She has not heard from [facility].  I emailed them and the day after the visit patient is emailed me saying that she has heard from them.  She is very keen to get on the transplant list.    02/03/2018 OV for Direct Admit for IV Cytoxan  Patient presents to the office today for direct admission to [facility] long hospital for IV Cytoxan infusion.  She says her breathing is at baseline.  She remains on oxygen 4 to 6 L at rest and 10 to 15 L with activity.  He says her leg swelling is at baseline.  She remains on Lasix 40 mg daily and Aldactone 100 mg.  She is on chronic steroids with prednisone 10 mg daily. She has been on 05 x1 week.  Says she is tolerating with no nausea vomiting or diarrhea.  Does have some mild constipation.  Patient says she had a Port-A-Cath placed 1 week ago. Patient has chronic shortness of breath gets winded with minimal activity.  Has intermittent dry cough.   02/13/2018 Follow up : Progressive NSIP , O2 RF  Patient returns for a one-week follow-up.  Patient was recently admitted for IV Cytoxan under observation status.. Patient had been on oral Cytoxan.  Continue to have progressive worsening of her ILD.  At baseline she is on 4 to 6 L at rest.  And 10 to 15 L with activity.  She is on Aldactone and Lasix.  She has been  started on OFEV 3 weeks ago.  She is tolerating well with no nausea vomiting or diarrhea.  Does have some mild constipation. Since her Cytoxan infusion.  She says she has done well.  She had minimal nausea.  She does feel that her breathing has slightly improved.  Her oxygen demands are decreased.  She is using mainly only 6 L of oxygen.  And doing well with this with good O2 saturations. Says she was able to actually go out to dinner for the first time in a long  time.  She denies any hematuria, chest pain, orthopnea or increased leg swelling.  She is working on weight loss.  She is going to the weight loss center. Labs were done on 12/20 that showed normal Ericksen blood cell count, platelets and kidney function. Urinalysis was done today.  And is pending.. Plans are for a Cytoxan infusion in 3- 4 weeks from her last infusion.  OV 03/02/2018  Subjective:  Patient ID: Tasha Ballard, female , DOB: 02/04/1971 , age 23 y.o. , MRN: [id number] , ADDRESS: [address] [place] [address]   03/02/2018 -   Chief Complaint  Patient presents with  . Follow-up    Appt prior to pt's next Cytoxan infusion.  Pt states she is doing well since last visit. States she can tell that her breathing has improved. Has had some nausea/vomiting and wonders if it is from the [place].   #Morbid Obesity - needs weight loss for transplant - sees Dr Janeal Holmes since dec 2019 #PRogressive NSIP #Chronic hypoxemic resp failure - 2L at rest, 8L with exertion # High risk treatment regimen  -CellCept 2013 through 2019  -Chronic prednisone 2013 -currently taking -Bactrim prophylaxis 2013 --currently taking  - Cytoxan 72m per day 12/23/17 and increaed to 1045mon 12/23/17.On11/19/19. increase to 12580mer day  And switched to IV cytoxan Q4 week cycle - 1st cycle 02/03/18 (s/p portocath 01/26/18)  - Ofev 01/23/18 (approximnate)   HPI ChrJANSEN GOODPASTURE 39o. -presents for the above issues.  I personally saw her before Thanksgiving 2019.  After that she is seen by nurse practitioner under my supervision as documented above for Cytoxan infusion.  She has had post Cytoxan infusion labs which showed her [place] count is decreasing but still adequate.  Most recent labs was February 13, 2018 at the weight loss clinic with a [place] count of 5900.  Rest of the labs reviewed and appear adequate and normal.  Including liver function test.  She is developed new onset of intermittent nausea and  vomiting ever since starting nintedanib early December 2019.  This particularly happens with the Christmas meals.  She says this appears at random and is mild and is treated with Zofran.  Given the beneficial effects of nintedanib and progressive non-IPF ILD she is agreed to continue to take this.  She continues on her chronic prednisone and Bactrim prophylaxis.  She is finished 1 cycle of IV Cytoxan February 03, 2018.  She has upcoming second cycle 4 weeks apart on March 06, 2018.  At this point in time she is feeling well.  She feels her ILD has actually improved.  On 2 L at rest she was saturating at 98%.  This is a huge improvement for her.  However when we walked her she still needed 8 L of oxygen with exertion.  She still says this is an improvement because in the past she was using more than 10 L for exertion.  Today  my nurse practitioner will enter the orders for upcoming infusion.  There is no fever.  Morbid obesity: She has seen Dr. Leafy Ro at the weight loss clinic.  I reviewed the notes.  Dietary measures are indicated.  I have written to Dr. Leafy Ro asking about medications to accelerate weight loss given the fact her lung disease is poor prognosis and she is an urgent need for lung transplant and with weight loss being her main barrier.   Obesity: Weight 231#  11/22/17  -> 221# 01/17/2018., -> 222# on 03/02/2018    OV 03/30/2018  Subjective:  Patient ID: Tasha Ballard, female , DOB: Jan 20, 1971 , age 38 y.o. , MRN: [id number] , ADDRESS: [place] [address]   03/30/2018 -   Chief Complaint  Patient presents with  . Follow-up    PFT peformed today.  Pt states she has been doing okay since last visit.     #Morbid Obesity - needs weight loss for transplant - sees Dr Janeal Holmes since dec 2019 #PRogressive NSIP #Chronic hypoxemic resp failure - 2L at rest, 8L with exertion # High risk treatment regimen  -CellCept 2013 through 2019  -Chronic prednisone 2013 -currently  taking -Bactrim prophylaxis 2013 --currently taking  - Cytoxan Oral  44m per day 12/23/17 and increaed to 1089mon 12/23/17.On11/19/19. increase to 12580mer day   - Cytoxan IV : And switched to IV cytoxan Q4 week cycle   - 1st cycle 02/03/18 (s/p portocath 01/26/18)  = 2nd cycle 03/06/2018  - 3rd cycle  -pending 04/03/2018  - Ofev 01/23/18 (approximnate)   HPI ChrMIKIAH DURALL 37o. -presents for follow-up of the above issues.  Her husband is here with her.  In terms of - Obesity: Weight 231#  11/22/17  -> 221# 01/17/2018., -> 222# on 03/02/2018 -> 220# on 03/30/2018  In terms of her progressive NSIP and chronic hypoxemic respiratory failure: She has finished 2 cycles of Cytoxan.  Second cycle was a full dose.  After that she got neutropenic as reviewed on the labs.  During this time she developed a facial rash.  This was on a weekend and over the phone we treated her with doxycycline.  It is largely improved.  Tomorrow she has an appointment with her dermatologist.  I do not know who it is.  Overall she is feeling stable in terms of her IPF.  [facility]  In fact [place] to [place] is even better.  She attributes this to both nintedanib and Cytoxan.  She is using 4 L of oxygen at rest and 8 L with exertion.  Her symptom score is documented below with shortness of breath at 21-22.  She always has a lot of fatigue.  In terms of her medicine tolerance particularly with nintedanib she had intermittent nausea and vomiting particularly after eating certain types of foods.  She is managing this with Zofran.  Symptom score is documented below.  Her most recent cycle of Cytoxan was March 06, 2018.  Her next Cytoxan is April 03, 2018.  I discussed with Dr. MohJulien Nordmanncologist was a lot of experience with Cytoxan.  He said is okay to go with full dose if I felt so.  Patient herself is very keen on getting a full dose of Cytoxan.   She did not have neutropenic fever although she got neutropenic with the second cycle.   SYMPTOM SCALE - ILD 03/30/2018   O2  use  4 L at rest, 8 L with exertion  Shortness of Breath 0 -> 5 scale with 5 being worst (score 6 If unable to do)  At rest 0  Simple tasks - showers, clothes change, eating, shaving 1  Household (dishes, doing bed, laundry) 2.5  Shopping 3  Walking level at own pace 3  Walking keeping up with others of same age 11  Walking up Stairs 4  Walking up [place] 4  Total (40 - 48) Dyspnea Score 21.5  How bad is your cough? x  How bad is your fatigue 5 ("very, lol"          Results for Diamant, MANAAL MANDALA" (MRN [id number]) as of 03/30/2018 13:38  Ref. Range 10/05/2017 [facility] 11/08/2017 [facility] 03/30/2018 11:50  FVC-Pre Latest Units: L 0.96 FVC 0.9 L,   1.09  FVC-%Pred-Pre Latest Units: % 31 27% 32  Results for Susi, RHEN DOSSANTOS" (MRN [id number]) as of 03/30/2018 13:38  Ref. Range 10/05/2017 9/17/20189 03/30/2018 11:50  DLCO unc Latest Units: ml/min/mmHg 5.47  5.86 6.85  DLCO unc % pred Latest Units: % 28 29% 34   ROS - per HPI     has a past medical history of Aneurysm ([place]), Back pain, Diabetes mellitus without complication ([place]), H/O blood clots, Hypertension, Joint pain, Pulmonary fibrosis ([facility]), SOB (shortness of breath), Swelling, Thyroid disease, and Vitamin D deficiency.   reports that she has never smoked. She has never used smokeless tobacco.  Past Surgical History:  Procedure Laterality Date  . ABLATION    . IR IMAGING GUIDED PORT INSERTION  01/26/2018  . KNEE SURGERY    . LUNG BIOPSY      Allergies  Allergen Reactions  . Macrobid [Nitrofurantoin Macrocrystal] Nausea And Vomiting  . Adhesive [Tape] Rash    Immunization History  Administered Date(s) Administered  . Influenza,inj,Quad PF,6+ Mos 12/24/2014, 02/05/2016, 01/07/2017, 11/22/2017  . Influenza-Unspecified 12/24/2014, 02/05/2016, 01/07/2017, 11/22/2017  . Pneumococcal  Conjugate-13 07/20/2017  . Pneumococcal Polysaccharide-23 06/09/2012  . Tdap 02/19/2011    Family History  Problem Relation Age of Onset  . Heart failure Mother   . Heart disease Mother   . Rheumatologic disease Father      Current Outpatient Medications:  .  apixaban (ELIQUIS) 5 MG TABS tablet, Take 5 mg by mouth 2 (two) times daily., Disp: , Rfl:  .  benzonatate (TESSALON) 100 MG capsule, Take 100 mg by mouth 3 (three) times daily as needed for cough. , Disp: , Rfl:  .  carvedilol (COREG) 6.25 MG tablet, Take 6.25 mg by mouth 2 (two) times daily with a meal., Disp: , Rfl:  .  cholecalciferol (VITAMIN D) 25 MCG (1000 UT) tablet, Take 1,000 Units by mouth daily., Disp: , Rfl:  .  doxycycline (VIBRA-TABS) 100 MG tablet, Take 1 tablet by mouth 2 (two) times daily., Disp: , Rfl:  .  furosemide (LASIX) 40 MG tablet, Take 40 mg by mouth See admin instructions. Take 1 tablet (40 mg) daily, may repeat dose if needed for fluid retention., Disp: , Rfl:  .  hydrocortisone ointment 0.5 %, Apply 1 application topically 2 (two) times daily., Disp: 30 g, Rfl: 0 .  metFORMIN (GLUCOPHAGE) 500 MG tablet, Take 500 mg by mouth 2 (two) times daily. , Disp: , Rfl:  .  montelukast (SINGULAIR) 10 MG tablet, Take 10 mg by mouth daily after supper., Disp: , Rfl:  .  OFEV 150 MG CAPS, Take 150 mg by mouth 2 (two) times daily.,  Disp: , Rfl: 11 .  ondansetron (ZOFRAN) 8 MG tablet, Take 1 tablet (8 mg total) by mouth every 8 (eight) hours as needed for nausea or vomiting., Disp: 45 tablet, Rfl: 3 .  potassium chloride (K-DUR) 10 MEQ tablet, Take 10 mEq by mouth 2 (two) times daily. , Disp: , Rfl: 0 .  predniSONE (DELTASONE) 10 MG tablet, Take 10-9m as needed for SOB (Patient taking differently: Take 10 mg by mouth daily with breakfast. ), Disp: 30 tablet, Rfl: 3 .  PROAIR HFA 108 (90 Base) MCG/ACT inhaler, Inhale 2 puffs into the lungs every 6 (six) hours as needed for wheezing or shortness of breath., Disp: , Rfl:  4 .  spironolactone (ALDACTONE) 100 MG tablet, Take 100 mg by mouth daily., Disp: , Rfl:  .  sulfamethoxazole-trimethoprim (BACTRIM) 400-80 MG tablet, Once a day while on prednisone (Patient taking differently: Take 1 tablet by mouth every evening. ), Disp: 90 tablet, Rfl: 1 .  thyroid (ARMOUR) 90 MG tablet, Take 90 mg by mouth daily before breakfast. , Disp: , Rfl:  .  Vitamin D, Ergocalciferol, (DRISDOL) 1.25 MG (50000 UT) CAPS capsule, Take 1 capsule (50,000 Units total) by mouth every 7 (seven) days., Disp: 4 capsule, Rfl: 0      Objective:   Vitals:   03/30/18 1326  BP: 128/72  Pulse: 88  SpO2: 99%  Weight: 220 lb (99.8 kg)  Height: _0  (1.575 m)    Estimated body mass index is 40.24 kg/m as calculated from the following:   Height as of this encounter: _1  (1.575 m).   Weight as of this encounter: 220 lb (99.8 kg).  _2 @  [facility]  03/30/18 1326  Weight: 220 lb (99.8 kg)     Physical Exam  General Appearance:    Alert, cooperative, no distress, appears stated age - yes , Deconditioned looking - no , OBESE  - yes, Sitting on Wheelchair -  no  Head:    Normocephalic, without obvious abnormality, atraumatic  Eyes:    PERRL, conjunctiva/corneas clear,  Ears:    Normal TM's and external ear canals, both ears  Nose:   Nares normal, septum midline, mucosa normal, no drainage    or sinus tenderness. OXYGEN ON  - uyes . Patient is @ 4LNC   Throat:   Lips, mucosa, and tongue normal; teeth and gums normal. Cyanosis on lips - no  Neck:   Supple, symmetrical, trachea midline, no adenopathy;    thyroid:  no enlargement/tenderness/nodules; no carotid   bruit or JVD  Back:     Symmetric, no curvature, ROM normal, no CVA tenderness  Lungs:     Distress - no , Wheeze no, Barrell Chest - no, Purse lip breathing - no, Crackles - yes at base   Chest Wall:    No tenderness or deformity.    Heart:    Regular rate and rhythm, S1 and S2 normal, no rub   or gallop,  Murmur - no  Breast Exam:    NOT DONE  Abdomen:     Soft, non-tender, bowel sounds active all four quadrants,    no masses, no organomegaly. Visceral obesity - YES  Genitalia:   NOT DONE  Rectal:   NOT DONE  Extremities:   Extremities - normal, Has Cane - no, Clubbing - no, Edema - no  Pulses:   2+ and symmetric all extremities  Skin:   Stigmata of Connective Tissue Disease - no  Lymph nodes:  Cervical, supraclavicular, and axillary nodes normal  Psychiatric:  Neurologic:   Pleasant - yes, Anxious - no, Flat affect - no  CAm-ICU - neg, Alert and Oriented x 3 - yes, Moves all 4s - yes, Speech - normal, Cognition - intact           Assessment:       ICD-10-CM   1. ILD (interstitial lung disease) ([place]) J84.9   2. 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 Chronic respiratory failure with hypoxia (HCC) J96.11   4. Encounter for long-term current use of high risk medication Z79.899   5. Drug-induced nausea and vomiting R11.2    T50.905A   6. Morbid obesity ([place]) E66.01   7. Rash R21        Plan:     Patient Instructions  ILD (interstitial lung disease) ([place]) NSIP (nonspecific interstitial pneumonia) ([place]) Chronic respiratory failure with hypoxia ([place]) Encounter for long-term current use of high risk medication  - lung function stabilized/improved with ofev/cytoxan  - continue ofev daily - ok for cycle #3 cytoxan on 04/03/2018 (will do labs and orders on morning of)  - will do full dose - continue baseline prednisone and 3x/week of bactrim - continue o2 4L at rest, 8L with exertion   Drug-induced nausea and vomiting  - due to ofev - glad you are managing this with zofran and dietary change  Morbid obesity ([place])  - glad you are losing weight - continue good work  Rash  - improved  - will be curioujs to see what Dermatologist says   Followup  - 4 weeks to ILD clinic - which is 2 weeks post cycle #3 of cytoxan       > 50% of this > 40  min visit spent in face to face counseling or/and coordination of care - by this undersigned MD - Dr Brand Males. This includes one or more of the following documented above: discussion of test results, diagnostic or treatment recommendations, prognosis, risks and benefits of management options, instructions, education, compliance or risk-factor reduction   SIGNATURE    Dr. Brand Males, M.D., F.C.C.P,  Pulmonary and Critical Care Medicine Staff Physician, [place] Director - Interstitial Lung Disease  Program  [facility] [place], [place], [address]  Pager: [phone], If no answer or between  15:00h - 7:00h: call [phone] Telephone: [phone]  2:14 PM 03/30/2018

## 2018-03-30 NOTE — Telephone Encounter (Signed)
Tasha  CLEMENTINA Ballard needs Cycle #3 of IV cytoxan on 04/03/2018. She can get the full dose like cycle #2. She will have labs first things at [place]

## 2018-03-30 NOTE — Addendum Note (Signed)
Addended by: Lorretta Harp on: 03/30/2018 04:22 PM   Modules accepted: Orders

## 2018-03-30 NOTE — Telephone Encounter (Signed)
Called [facility].  Spoke with [place]. TP recommendations given. Kenney Houseman took a note for [facility], who had left for the day, to call back 03/31/18, to confirm and make sure all was set up properly.    Will hold in triage for follow up 03/31/18

## 2018-03-30 NOTE — Telephone Encounter (Signed)
[name] please advise, thank you.

## 2018-03-31 ENCOUNTER — Ambulatory Visit: Payer: BLUE CROSS/BLUE SHIELD | Admitting: Internal Medicine

## 2018-03-31 ENCOUNTER — Telehealth: Payer: Self-pay | Admitting: Internal Medicine

## 2018-03-31 DIAGNOSIS — J849 Interstitial pulmonary disease, unspecified: Secondary | ICD-10-CM

## 2018-03-31 NOTE — Telephone Encounter (Signed)
STAT CBC, CMAC, UA prior to infusion-results to be paged to MD prior to infusion start

## 2018-03-31 NOTE — Telephone Encounter (Signed)
Ordered cbc, cmet, and ua stat prior to cytoxin infusion and page Dr [name] with results prior to infusion.  Raquel Sarna, please advise if anything else is needed.

## 2018-03-31 NOTE — Telephone Encounter (Signed)
Unable to find [facility].   Please advise.

## 2018-03-31 NOTE — Telephone Encounter (Signed)
Spoke to [employer] at [employer].  Relayed MR and TP's messages from 03/30/18.  Verified Kim had protocol sheet.  Pt will have labs drawn in am.  TP please advise which labs should be drawn?

## 2018-03-31 NOTE — Telephone Encounter (Signed)
Sorry , [employer] operational officer mis quoted  Cbc, cmet, UA -stat   Also please check with [facility] as she has done this previously  .she is a good resource.

## 2018-04-03 ENCOUNTER — Encounter (HOSPITAL_COMMUNITY): Payer: Self-pay

## 2018-04-03 ENCOUNTER — Ambulatory Visit (HOSPITAL_COMMUNITY)
Admission: RE | Admit: 2018-04-03 | Discharge: 2018-04-03 | Disposition: A | Discharge status: Home / Self Care | Payer: BLUE CROSS/BLUE SHIELD | Source: Ambulatory Visit | Attending: Internal Medicine | Admitting: Internal Medicine

## 2018-04-03 ENCOUNTER — Encounter (INDEPENDENT_AMBULATORY_CARE_PROVIDER_SITE_OTHER): Payer: Self-pay | Admitting: Family Medicine

## 2018-04-03 DIAGNOSIS — J84112 Idiopathic pulmonary fibrosis: Secondary | ICD-10-CM | POA: Insufficient documentation

## 2018-04-03 LAB — CBC WITH DIFFERENTIAL/PLATELET
Abs Immature Granulocytes: 0.11 10*3/uL — ABNORMAL HIGH (ref 0.00–0.07)
BASOS PCT: 1 %
Basophils Absolute: 0.1 10*3/uL (ref 0.0–0.1)
Eosinophils Absolute: 0.1 10*3/uL (ref 0.0–0.5)
Eosinophils Relative: 1 %
HCT: 38.4 % (ref 36.0–46.0)
Hemoglobin: 11.4 g/dL — ABNORMAL LOW (ref 12.0–15.0)
Immature Granulocytes: 1 %
Lymphocytes Relative: 11 %
Lymphs Abs: 1.2 10*3/uL (ref 0.7–4.0)
MCH: 26.4 pg (ref 26.0–34.0)
MCHC: 29.7 g/dL — ABNORMAL LOW (ref 30.0–36.0)
MCV: 88.9 fL (ref 80.0–100.0)
Monocytes Absolute: 0.7 10*3/uL (ref 0.1–1.0)
Monocytes Relative: 7 %
NRBC: 0 % (ref 0.0–0.2)
Neutro Abs: 8.3 10*3/uL — ABNORMAL HIGH (ref 1.7–7.7)
Neutrophils Relative %: 79 %
PLATELETS: 227 10*3/uL (ref 150–400)
RBC: 4.32 MIL/uL (ref 3.87–5.11)
RDW: 18.6 % — ABNORMAL HIGH (ref 11.5–15.5)
WBC: 10.4 10*3/uL (ref 4.0–10.5)

## 2018-04-03 LAB — URINALYSIS, ROUTINE W REFLEX MICROSCOPIC
Bilirubin Urine: NEGATIVE
Glucose, UA: NEGATIVE mg/dL
Hgb urine dipstick: NEGATIVE
Ketones, ur: NEGATIVE mg/dL
Nitrite: NEGATIVE
Protein, ur: NEGATIVE mg/dL
SPECIFIC GRAVITY, URINE: 1.006 (ref 1.005–1.030)
pH: 7 (ref 5.0–8.0)

## 2018-04-03 LAB — COMPREHENSIVE METABOLIC PANEL
ALT: 20 U/L (ref 0–44)
AST: 24 U/L (ref 15–41)
Albumin: 3.5 g/dL (ref 3.5–5.0)
Alkaline Phosphatase: 57 U/L (ref 38–126)
Anion gap: 12 (ref 5–15)
BUN: 15 mg/dL (ref 6–20)
CHLORIDE: 94 mmol/L — AB (ref 98–111)
CO2: 31 mmol/L (ref 22–32)
Calcium: 9.5 mg/dL (ref 8.9–10.3)
Creatinine, Ser: 0.75 mg/dL (ref 0.44–1.00)
GFR calc Af Amer: >60 mL/min (ref >60)
GFR calc non Af Amer: >60 mL/min (ref >60)
Glucose, Bld: 183 mg/dL — ABNORMAL HIGH (ref 70–99)
POTASSIUM: 3.2 mmol/L — AB (ref 3.5–5.1)
SODIUM: 137 mmol/L (ref 135–145)
Total Bilirubin: 0.4 mg/dL (ref 0.3–1.2)
Total Protein: 6.6 g/dL (ref 6.5–8.1)

## 2018-04-03 LAB — PHOSPHORUS: Phosphorus: 4.1 mg/dL (ref 2.5–4.6)

## 2018-04-03 LAB — MAGNESIUM: Magnesium: 1.8 mg/dL (ref 1.7–2.4)

## 2018-04-03 MED ORDER — HYDROCORTISONE NA SUCCINATE PF 100 MG IJ SOLR
100.0000 mg | INTRAMUSCULAR | Status: DC
  Administered 2018-04-03: 100 mg via INTRAVENOUS
  Filled 2018-04-03: qty 2

## 2018-04-03 MED ORDER — ONDANSETRON HCL 8 MG PO TABS
8.0000 mg | ORAL_TABLET | ORAL | Status: DC
  Administered 2018-04-03: 8 mg via ORAL
  Filled 2018-04-03: qty 1

## 2018-04-03 MED ORDER — DEXTROSE-NACL 5-0.45 % IV SOLN: INTRAVENOUS | Status: DC

## 2018-04-03 MED ORDER — SODIUM CHLORIDE 0.9 % IV SOLN
2000.0000 mg | Freq: Once | INTRAVENOUS | Status: AC
  Administered 2018-04-03: 2000 mg via INTRAVENOUS
  Filled 2018-04-03: qty 100

## 2018-04-03 MED ORDER — HEPARIN SOD (PORK) LOCK FLUSH 100 UNIT/ML IV SOLN
500.0000 [IU] | INTRAVENOUS | Status: DC
  Administered 2018-04-03: 500 [IU] via INTRAVENOUS
  Filled 2018-04-03: qty 5

## 2018-04-03 MED ORDER — SODIUM CHLORIDE 0.9 % IV SOLN
500.0000 mg | Freq: Once | INTRAVENOUS | Status: AC
  Administered 2018-04-03: 500 mg via INTRAVENOUS
  Filled 2018-04-03 (×2): qty 5

## 2018-04-03 MED ORDER — MAGNESIUM SULFATE 2 GM/50ML IV SOLN
2.0000 g | Freq: Once | INTRAVENOUS | Status: AC
  Administered 2018-04-03: 2 g via INTRAVENOUS
  Filled 2018-04-03: qty 50

## 2018-04-03 MED ORDER — SODIUM CHLORIDE 0.9 % IV SOLN
500.0000 mg | Freq: Once | INTRAVENOUS | Status: AC
  Administered 2018-04-03: 500 mg via INTRAVENOUS
  Filled 2018-04-03: qty 5

## 2018-04-03 MED ORDER — DEXTROSE-NACL 5-0.45 % IV SOLN
INTRAVENOUS | Status: DC
  Administered 2018-04-03: 07:00:00 via INTRAVENOUS

## 2018-04-03 MED ORDER — POTASSIUM CHLORIDE CRYS ER 20 MEQ PO TBCR
40.0000 meq | EXTENDED_RELEASE_TABLET | Freq: Once | ORAL | Status: AC
  Administered 2018-04-03: 40 meq via ORAL
  Filled 2018-04-03: qty 2

## 2018-04-03 NOTE — Telephone Encounter (Signed)
Nothing further needed 

## 2018-04-07 ENCOUNTER — Other Ambulatory Visit (HOSPITAL_COMMUNITY): Payer: BLUE CROSS/BLUE SHIELD

## 2018-04-12 ENCOUNTER — Other Ambulatory Visit (INDEPENDENT_AMBULATORY_CARE_PROVIDER_SITE_OTHER): Payer: BLUE CROSS/BLUE SHIELD

## 2018-04-12 ENCOUNTER — Other Ambulatory Visit: Payer: BLUE CROSS/BLUE SHIELD

## 2018-04-12 ENCOUNTER — Telehealth: Payer: Self-pay | Admitting: Adult Health

## 2018-04-12 DIAGNOSIS — R112 Nausea with vomiting, unspecified: Secondary | ICD-10-CM

## 2018-04-12 DIAGNOSIS — J849 Interstitial pulmonary disease, unspecified: Secondary | ICD-10-CM

## 2018-04-12 DIAGNOSIS — T50905A Adverse effect of unspecified drugs, medicaments and biological substances, initial encounter: Principal | ICD-10-CM

## 2018-04-12 LAB — COMPREHENSIVE METABOLIC PANEL
ALT: 18 U/L (ref 0–35)
AST: 14 U/L (ref 0–37)
Albumin: 3.7 g/dL (ref 3.5–5.2)
Alkaline Phosphatase: 56 U/L (ref 39–117)
BUN: 9 mg/dL (ref 6–23)
CO2: 36 mEq/L — ABNORMAL HIGH (ref 19–32)
Calcium: 9.7 mg/dL (ref 8.4–10.5)
Chloride: 93 mEq/L — ABNORMAL LOW (ref 96–112)
Creatinine, Ser: 0.72 mg/dL (ref 0.40–1.20)
GFR: 85.87 mL/min (ref >60.00)
Glucose, Bld: 115 mg/dL — ABNORMAL HIGH (ref 70–99)
POTASSIUM: 3.1 meq/L — AB (ref 3.5–5.1)
Sodium: 137 mEq/L (ref 135–145)
Total Bilirubin: 0.5 mg/dL (ref 0.2–1.2)
Total Protein: 6.4 g/dL (ref 6.0–8.3)

## 2018-04-12 LAB — URINALYSIS, ROUTINE W REFLEX MICROSCOPIC
Bilirubin Urine: NEGATIVE
Hgb urine dipstick: NEGATIVE
Ketones, ur: NEGATIVE
Nitrite: NEGATIVE
Specific Gravity, Urine: 1.01 (ref 1.000–1.030)
Total Protein, Urine: NEGATIVE
Urine Glucose: NEGATIVE
Urobilinogen, UA: 0.2 (ref 0.0–1.0)
pH: 6.5 (ref 5.0–8.0)

## 2018-04-12 LAB — CBC
HCT: 32.4 % — ABNORMAL LOW (ref 36.0–46.0)
Hemoglobin: 10.6 g/dL — ABNORMAL LOW (ref 12.0–15.0)
MCHC: 32.6 g/dL (ref 30.0–36.0)
MCV: 83.4 fl (ref 78.0–100.0)
PLATELETS: 229 10*3/uL (ref 150.0–400.0)
RBC: 3.88 Mil/uL (ref 3.87–5.11)
RDW: 18.7 % — ABNORMAL HIGH (ref 11.5–15.5)
WBC: 1.7 10*3/uL — CL (ref 4.0–10.5)

## 2018-04-12 NOTE — Telephone Encounter (Signed)
Spoke with patient. She is aware of results. I will fax an add-on sheet to the [facility] lab for the culture.   Nothing further needed at time of call.

## 2018-04-12 NOTE — Telephone Encounter (Signed)
Labs showed leukopenia. I reviewed results with Dr. [name], please tell patient to monitor for signs of infection/fever. Avoid sick contacts. Needs urine culture (had a UA unsure if we can add on). Tell patient to take 1 addition tab of potassium twice daily x 2 days.

## 2018-04-12 NOTE — Telephone Encounter (Signed)
Received call report from Santiago Glad  with lab on patient's CBC  done on 04/12/18. [name] P.  please review the result/impression copied below:  WBC- 1.7    Please advise, thank you.  Message routed to [facility], NP/ TP is not in the office this afternoon Message also routed to Alvira Monday, NP

## 2018-04-13 NOTE — Addendum Note (Signed)
Addended by: Jannette Spanner on: 04/13/2018 10:12 AM   Modules accepted: Orders

## 2018-04-13 NOTE — Telephone Encounter (Signed)
Called pt and advised message from the provider. Pt understood and verbalized understanding. Nothing further is needed.   I will place orders for lab

## 2018-04-13 NOTE — Telephone Encounter (Signed)
Needs repeat CBC w/ diff and bmet on 04/17/18 .  Remind her that she needs to neutropenic precautions -  Avoid crowds, wear mask when out . Handwashing , avoid sick contacts , report signs of infection immediately, (cough, congestion , increased weakness, fever , etc)  Please contact office for sooner follow up if symptoms do not improve or worsen or seek emergency care

## 2018-04-14 ENCOUNTER — Telehealth: Payer: Self-pay | Admitting: Internal Medicine

## 2018-04-14 LAB — URINE CULTURE
MICRO NUMBER:: 215218
SPECIMEN QUALITY:: ADEQUATE

## 2018-04-14 NOTE — Telephone Encounter (Signed)
Advised pt of results. Pt understood and nothing further is needed.   

## 2018-04-14 NOTE — Telephone Encounter (Signed)
Urine culture - normal colony. No UTI. Let her know. She is on cytoxan . So important to get word out. Sending to triage

## 2018-04-17 ENCOUNTER — Other Ambulatory Visit (INDEPENDENT_AMBULATORY_CARE_PROVIDER_SITE_OTHER): Payer: BLUE CROSS/BLUE SHIELD

## 2018-04-17 DIAGNOSIS — T50905A Adverse effect of unspecified drugs, medicaments and biological substances, initial encounter: Secondary | ICD-10-CM | POA: Diagnosis not present

## 2018-04-17 DIAGNOSIS — R112 Nausea with vomiting, unspecified: Secondary | ICD-10-CM

## 2018-04-17 LAB — BASIC METABOLIC PANEL
BUN: 11 mg/dL (ref 6–23)
CHLORIDE: 91 meq/L — AB (ref 96–112)
CO2: 31 mEq/L (ref 19–32)
CREATININE: 0.9 mg/dL (ref 0.40–1.20)
Calcium: 9.6 mg/dL (ref 8.4–10.5)
GFR: 66.37 mL/min (ref >60.00)
Glucose, Bld: 157 mg/dL — ABNORMAL HIGH (ref 70–99)
POTASSIUM: 3.4 meq/L — AB (ref 3.5–5.1)
Sodium: 139 mEq/L (ref 135–145)

## 2018-04-17 LAB — CBC WITH DIFFERENTIAL/PLATELET
BASOS ABS: 0 10*3/uL (ref 0.0–0.1)
Basophils Relative: 1.1 % (ref 0.0–3.0)
EOS ABS: 0.1 10*3/uL (ref 0.0–0.7)
Eosinophils Relative: 2.7 % (ref 0.0–5.0)
HEMATOCRIT: 37.4 % (ref 36.0–46.0)
Hemoglobin: 11.7 g/dL — ABNORMAL LOW (ref 12.0–15.0)
Lymphocytes Relative: 35.1 % (ref 12.0–46.0)
Lymphs Abs: 0.7 10*3/uL (ref 0.7–4.0)
MCHC: 31.2 g/dL (ref 30.0–36.0)
MCV: 85.5 fl (ref 78.0–100.0)
Monocytes Absolute: 0.5 10*3/uL (ref 0.1–1.0)
Monocytes Relative: 28 % — ABNORMAL HIGH (ref 3.0–12.0)
Neutro Abs: 0.6 10*3/uL — ABNORMAL LOW (ref 1.4–7.7)
Neutrophils Relative %: 33.1 % — ABNORMAL LOW (ref 43.0–77.0)
Platelets: 403 10*3/uL — ABNORMAL HIGH (ref 150.0–400.0)
RBC: 4.37 Mil/uL (ref 3.87–5.11)
RDW: 18.8 % — ABNORMAL HIGH (ref 11.5–15.5)
WBC: 1.9 10*3/uL — CL (ref 4.0–10.5)

## 2018-04-18 ENCOUNTER — Other Ambulatory Visit: Payer: Self-pay | Admitting: Adult Health

## 2018-04-18 MED ORDER — POTASSIUM CHLORIDE ER 10 MEQ PO TBCR: 20.0000 meq | EXTENDED_RELEASE_TABLET | Freq: Two times a day (BID) | ORAL | 2 refills | Status: DC

## 2018-04-18 NOTE — Progress Notes (Signed)
Called spoke with patient, advised of lab results / recs as stated by TP.  Pt verbalized understanding and denied any questions.

## 2018-04-24 ENCOUNTER — Other Ambulatory Visit (INDEPENDENT_AMBULATORY_CARE_PROVIDER_SITE_OTHER): Payer: Self-pay | Admitting: Family Medicine

## 2018-04-24 DIAGNOSIS — E559 Vitamin D deficiency, unspecified: Secondary | ICD-10-CM

## 2018-04-26 ENCOUNTER — Encounter: Payer: Self-pay | Admitting: Internal Medicine

## 2018-04-26 ENCOUNTER — Ambulatory Visit (INDEPENDENT_AMBULATORY_CARE_PROVIDER_SITE_OTHER): Payer: BLUE CROSS/BLUE SHIELD | Admitting: Internal Medicine

## 2018-04-26 VITALS — BP 122/78 | HR 102 | Ht 62.0 in | Wt 223.2 lb

## 2018-04-26 DIAGNOSIS — J019 Acute sinusitis, unspecified: Secondary | ICD-10-CM

## 2018-04-26 DIAGNOSIS — Z79899 Other long term (current) drug therapy: Secondary | ICD-10-CM | POA: Diagnosis not present

## 2018-04-26 DIAGNOSIS — J8489 Other specified interstitial pulmonary diseases: Secondary | ICD-10-CM

## 2018-04-26 DIAGNOSIS — J9611 Chronic respiratory failure with hypoxia: Secondary | ICD-10-CM

## 2018-04-26 DIAGNOSIS — J849 Interstitial pulmonary disease, unspecified: Secondary | ICD-10-CM

## 2018-04-26 DIAGNOSIS — Z006 Encounter for examination for normal comparison and control in clinical research program: Secondary | ICD-10-CM

## 2018-04-26 DIAGNOSIS — R0981 Nasal congestion: Secondary | ICD-10-CM

## 2018-04-26 LAB — BASIC METABOLIC PANEL
BUN: 20 mg/dL (ref 6–23)
CALCIUM: 9.7 mg/dL (ref 8.4–10.5)
CO2: 35 meq/L — AB (ref 19–32)
Chloride: 91 mEq/L — ABNORMAL LOW (ref 96–112)
Creatinine, Ser: 1.24 mg/dL — ABNORMAL HIGH (ref 0.40–1.20)
GFR: 45.85 mL/min — ABNORMAL LOW (ref >60.00)
Glucose, Bld: 99 mg/dL (ref 70–99)
Potassium: 3.9 mEq/L (ref 3.5–5.1)
Sodium: 136 mEq/L (ref 135–145)

## 2018-04-26 LAB — HEPATIC FUNCTION PANEL
ALT: 19 U/L (ref 0–35)
AST: 15 U/L (ref 0–37)
Albumin: 4.1 g/dL (ref 3.5–5.2)
Alkaline Phosphatase: 68 U/L (ref 39–117)
Bilirubin, Direct: 0 mg/dL (ref 0.0–0.3)
Total Bilirubin: 0.3 mg/dL (ref 0.2–1.2)
Total Protein: 6.9 g/dL (ref 6.0–8.3)

## 2018-04-26 LAB — CBC WITH DIFFERENTIAL/PLATELET
BASOS ABS: 0.1 10*3/uL (ref 0.0–0.1)
Basophils Relative: 0.3 % (ref 0.0–3.0)
EOS ABS: 0 10*3/uL (ref 0.0–0.7)
Eosinophils Relative: 0.3 % (ref 0.0–5.0)
HCT: 36.3 % (ref 36.0–46.0)
Hemoglobin: 11.8 g/dL — ABNORMAL LOW (ref 12.0–15.0)
Lymphocytes Relative: 7 % — ABNORMAL LOW (ref 12.0–46.0)
Lymphs Abs: 1 10*3/uL (ref 0.7–4.0)
MCHC: 32.5 g/dL (ref 30.0–36.0)
MCV: 84.5 fl (ref 78.0–100.0)
Monocytes Absolute: 0.8 10*3/uL (ref 0.1–1.0)
Monocytes Relative: 5.7 % (ref 3.0–12.0)
Neutro Abs: 12.5 10*3/uL — ABNORMAL HIGH (ref 1.4–7.7)
Neutrophils Relative %: 86.7 % — ABNORMAL HIGH (ref 43.0–77.0)
PLATELETS: 222 10*3/uL (ref 150.0–400.0)
RBC: 4.29 Mil/uL (ref 3.87–5.11)
RDW: 18.6 % — ABNORMAL HIGH (ref 11.5–15.5)
WBC: 14.4 10*3/uL — ABNORMAL HIGH (ref 4.0–10.5)

## 2018-04-26 LAB — RESPIRATORY VIRUS PANEL
Influenza A RNA: NOT DETECTED
Influenza B RNA: NOT DETECTED
RSV RNA: NOT DETECTED
hMPV: NOT DETECTED

## 2018-04-26 MED ORDER — DOXYCYCLINE HYCLATE 100 MG PO TABS: 100.0000 mg | ORAL_TABLET | Freq: Two times a day (BID) | ORAL | 0 refills | Status: DC

## 2018-04-26 NOTE — Patient Instructions (Addendum)
Acute sinusitis  - await RVP panel  - Take doxycycline 180m po twice daily x 7 days; take after meals and avoid sunlight   ILD (interstitial lung disease) (HCC) NSIP (nonspecific interstitial pneumonia) (HCC) Chronic respiratory failure with hypoxia ([place] Encounter for long-term current use of high risk medication High risk medication use  symptoms worse since last  Visit but suspect due to sinus issues  - continue ofev daily - do cbc, bmet, lft, mag, phos 04/26/2018  - if labs ok , RVP panel negative and responding to doxy then ok for cycle #4 cytoxan on 05/01/2018 (will do labs and orders on morning of)  - will do full dose - continue baseline prednisone and 3x/week of bactrim - continue o2 4L at rest, 8L with exertion - stay safe from COVID-19 - take my write up - 6 month ILD-PRO visit 04/26/2018   Drug-induced nausea and vomiting - improved - due to ofev - glad you are managing this with zofran as needed and dietary change  Morbid obesity ([place] - continue good work of losing weight  Rash - improved  - what did dermatologist say?  Followup  - 4 weeks to ILD clinic - which is 2 weeks post cycle #4 of cytoxan

## 2018-04-26 NOTE — Progress Notes (Signed)
-Lung function November 08, 2017: FVC 0.9 L / 27%, DLCO 5.86/29%  - HRCT 11/08/17 Rt diaphragm elevation ? Eventration v paresis.  Esophagogram November 11, 2017 - > 11/11/17 [name] - Normal  Dr. Threasa Alpha at [facility].She was started on  IOV 11/22/2017  Chief Complaint  Patient presents with  . Consult    consut due to pulmonary fibrosis. Pt has been followed by [facility] and last PFT was peformed 8/14 at [facility]. Pt does have complaints of SOB with exertion which she states has become better due to rehab. Pt also has a dry hacking cough with mild production of mucus. Denies any CP.    Is seen by presents to the ILD clinic.  This is her first visit with [name].  History is obtained from her and review of the [place] where she is to follow-up with Dr. Hortencia Pilar.  Interstitial lung disease clinic integrated interstitial lung disease questionnaire: Symptoms: Shortness of breath for years.  Gradually worse.  Currently level 5 shortness of breath walking up stairs or walking up a hill with oxygen.  Level for shortness of breath for sweeping or vacuuming and level 3 shortness of breath for shopping or picking up things and doing laundry.  Level 3 shortness of breath or standing up from a chair or taking a shower making the bed and level 1 dyspnea for brushing teeth.  She also has a cough for 6 years which is getting worse.  On and off that is phlegm.  Occasional wheezing present there is associated fatigue.  She tells me that she had undiagnosed and unrecognized ILD for years.  This was then picked up at [facility] and then she got referred to [facility].  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  At this time she was placed on CellCept.  Then in 2016 she got somewhat worse and was placed on 2 L nasal cannula continuously.  In 2017 she tells me that she had a aortic aneurysm and this was  repaired at [facility].  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  And then for the last year and a half or so she has been on significantly worsening hypoxemia.  In the spring 2019 she is requiring 8 L at rest and 15 L at exertion.  Apparently Dr. Lauris Chroman then recommended Rituxan infusions which she got to 60 days apart with the last one being approximately in April of May 2019.  After that she is somewhat improved requiring only 9-10 L with exertion.  She has been attending pulmonary rehabilitation which has been helping.  She is obese and unable to lose weight although she is lost some.  She believes a lot of her oxygen needs might be related to deviated nasal septum/septal perforation.  She subsequently followed up with Dr. Ruthann Cancer at [facility] in the summer 2019.  I reviewed the notes.  Palliative care was recommended because of progressive respiratory failure.  She has never seen the transplant clinic at [facility] but she recommends that because of obesity and diaphragmatic dysfunction that she would not be a good candidate for transplant .  She was then frustrated with experience because she wants to fight the disease.  She then drove to [facility] and saw Dr. Elisabeth Cara at [facility] 1 week ago.  She spent substantial number  of days there.  At this point in time she showed me an email from him that suggest he might have myositis-ILD but they would like to review her CT scans and pathology from [facility].  Apparently she had significant amount of blood work and other tests at [facility]  I do not have access to these records at this point.  She believes that she wants to undergo transplant.  She thinks that she would be able to have transplant in [place] despite a high BMI.  Currently in [place] she is somewhat stable attending pulmonary rehabilitation.  She is interested in pulmonary trials  Past  medical history  -Tested for sleep apnea at [facility] and was told to be negative.  But at [facility] in September 2019 they recommended a repeat sleep study -Denied all forms of connective tissue disease diagnosis and vasculitis.  Although at [facility] September 2019 when she was seen last week she was told she might have myositis-ILD but this is pending confirmation in the multidisciplinary case conference -She does report positive for diabetes and thyroid disease and mononucleosis in the past but denies any tuberculosis or kidney disease   Review of systems -Positive for fatigue for a few years, arthralgia for a few years but denies dysphagia.  Positive for dry eyes and mouth for few years.  No Raynaud.  No recurrent fever.  No weight loss.  No acid reflux but does admit to snoring for a few years.  No rash no ulcers.  Family history of lung disease COPD and a long but no pulmonary fibrosis.  Dad did have rheumatoid arthritis  Personal exposure history Denies tobacco, vaping, marijuana, cocaine, IV drugs  Home and hobby history  [place] single[place] home which is 50 years old  Occupational history -She does stay in a condition spaces.  122 point occupational questionnaire completely negative  Medication history -She has a history of allergy to Macrobid/nitrofurantoin.  Do not know when she took it.  She is currently on immunosuppressive regimen of CellCept, Bactrim and prednisone.  CellCept since 2013 and prednisone since 2012.  She did get Rituxan x2 doses with the last one being in spring 2019.  She feels her hypoxemia improved somewhat after that.     has a past medical history of Aneurysm ([place]), Diabetes mellitus without complication ([place]), and Thyroid disease.   reports that she has never smoked. She has never used smokeless tobacco.   OV 12/21/2017  Subjective:  Patient ID: Tasha Ballard, female , DOB: January 26, 1971 , age 17 y.o. , MRN: [id number] ,  ADDRESS: [address] [place] [address]   12/21/2017 -   Chief Complaint  Patient presents with  . Follow-up    ILD     HPI ARYSSA ROSAMOND 48 y.o. -returns for follow-up.  She brings her mother along who I am meeting for the first time.  This visit is specifically focused on evaluation from [place].  Yesterday I did speak to the interstitial lung disease specialist at [facility] Dr. Threasa Alpha.  He told me he was finally able to receive and review the surgical lung biopsy from [facility].  He and his pathologist agreed with the diagnosis of progressive NSIP.  His main recommendation was to switch patient to Cytoxan.  In addition also aggressively pursue weight loss even if it means for bariatric surgery approach so we can get ready for transplant.  He agreed the prognosis is very poor.  At this point in time patient compared to the last visit is stable.  She is using more than 10 L of oxygen up to 15 L of oxygen.  She is interested in Cytoxan.  Dr. Elisabeth Cara indicated to me that efficacy wise oral and IV are similar but he felt IV would be a little bit safer from a urologic standpoint.  Patient is interested in weight loss.  She says she has had difficulty losing weight despite being hypocaloric.  She is interested in nutritional referral.  In addition she wants a sleep specialist referral.  She says [place] [facility] was ruled out.  She has significant insomnia and daytime tiredness.  She said that [place] recommended sleep referral ASAP.  She is registered to participate in the ILD-pro registry study for progressive non-IPF disease.  This was done today.   Investigations at [facility] -November 09, 2017: Echocardiogram ejection fraction 70-75% with grade 2 diastolic dysfunction.  Right ventricle was mildly enlarged but global RV function was felt to be normal.  No pericardial effusion no obvious right-to-left shunt at rest or cough or Valsalva on agitated  saline contrast exam and stage I right ventricular diastolic dysfunction.  Pulmonary artery systolic pressure at 45 mmHg  -Lung function November 08, 2017: FVC 0.9 L / 27%, DLCO 5.86/29%  - HRCT 11/08/17 Rt diaphragm elevation ? Eventration v paresis. Sniff test recommendd  Esophagogram November 11, 2017 - > 11/11/17 Natil Jewsh - Normal    12/22/2017-telephone encounter - Dr. [name] >>>starting Cytoxan 50 mg/day >>>Avoid lots of fluids to avoid cystitis, avoid intercourse without contraception, return in 7 to 10 days to recheck CBC, Bmet, LFTs, UA  12/22/17-office visit-[place] >>>Plan in lab sleep study be ordered, patient will likely need BiPAP with oxygen  12/30/2017  - Visit   48 year old female patient presenting today for one-week follow-up visit after starting Cytoxan.  Patient also get lab work completed today.  Unfortunately patient did not get this completed prior to her office visit she will get afterwards.  Patient reports baseline symptoms of nonproductive cough.  Requiring 4 to 6 L of oxygen at rest as well as 10 to 15 L of oxygen with exertion.  Patient does have her baseline shortness of breath with exertion.  Patient reporting no worsening symptoms since starting Cytoxan.   Patient reports she typically is on 9 to 10 L of oxygen at home.   OV 01/17/2018  Subjective:  Patient ID: Tasha Ballard, female , DOB: 11-Mar-1970 , age 58 y.o. , MRN: [id number] , ADDRESS: [address] [place] [address]   01/17/2018 -   Chief Complaint  Patient presents with  . Follow-up    2 week f/u, lung transplant, was contacted and waiting on weight loss to continue   PRogressive NSIP Chronic hypoxemic resp failure Morbid Obesity - needs weight loss for transplant Rx regimen   -  cytoxan 9m per day 12/23/17 and increaed to 1057mon 12/23/17.On11/19/19. increase to 12558mer day  And switched to IV cytoxan Q4 week cycle - 1st cycle 02/03/18.   HPI ChrLEYANNA BITTMAN 59.o. -presents for follow-up with her husband.  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 on cytoxan 64m2mr day 12/23/17 and increaed to 100mg51m11/1/19.  On 01/10/18.  increase to 125mg 9mday .  Plan is to get blood work today.  She wants a Port-A-Cath placed.  She prefers IV Cytoxan.  I checked with Dr. Threasa Alpha and he feels either p.o. or IV Cytoxan is fine but in the balance IV Cytoxan has a slightly lower incidence of hemorrhagic cystitis and the patient prefers this.  At this point in time she tells me that the Cytoxan seems to be working well for her.  She is less hypoxemic she is able to get by with 5 L at rest.  On the other hand this could be because of weight loss.  However there is some fatigue.  She will have blood work and urine analysis to look for cytotoxicity from Cytoxan.  Based on the newINBUILD  data we discussed about starting nintedanib.  I checked with Dr. Threasa Alpha at [facility] and he is supportive of the plan.  Patient states that she has done extensive research on nintedanib and she is very excited about the possibility of taking this.   Obesity: Weight 231#  11/22/17  -> 221# 01/17/2018.,  She saw the nutritionist at [facility].  She is using an app and tracking her weight on a regular basis.  She is encouraged by the weight loss.  We gave her a low glycemic diet sheet.  She has not been able to see the bariatric surgeon at [facility] surgery but I did run into Dr. Kaylyn Lim in the hallway and he said he would be happy to see her and counsel her.  Although he did acknowledge she would be high risk for surgery.  He is in favor of low carbohydrate diet.  A few weeks ago I met the transplant physician from [facility] and he brought up the idea about losing weight loss drugs.  His preferred drug is phentermine.  We discussed the  possibility about getting an endocrine consult  Lung transplant: [facility] has called her.  They want to BMI less than 30 or 36 I am not sure.  She is working towards his goal.  She has not heard from [facility].  I emailed them and the day after the visit patient is emailed me saying that she has heard from them.  She is very keen to get on the transplant list.    02/03/2018 OV for Direct Admit for IV Cytoxan  Patient presents to the office today for direct admission to [facility] for IV Cytoxan infusion.  She says her breathing is at baseline.  She remains on oxygen 4 to 6 L at rest and 10 to 15 L with activity.  He says her leg swelling is at baseline.  She remains on Lasix 40 mg daily and Aldactone 100 mg.  She is on chronic steroids with prednisone 10 mg daily. She has been on 05 x1 week.  Says she is tolerating with no nausea vomiting or diarrhea.  Does have some mild constipation.  Patient says she had a Port-A-Cath placed 1 week ago. Patient has chronic shortness of breath gets winded with minimal activity.  Has intermittent dry cough.   02/13/2018 Follow up : Progressive NSIP , O2 RF  Patient returns for a one-week follow-up.  Patient was recently admitted for IV Cytoxan under observation status.. Patient had been on oral Cytoxan.  Continue to have progressive worsening of her ILD.  At baseline she is on 4 to 6 L at rest.  And 10 to 15 L with activity.  She is on Aldactone and Lasix.  She has been started on  OFEV 3 weeks ago.  She is tolerating well with no nausea vomiting or diarrhea.  Does have some mild constipation. Since her Cytoxan infusion.  She says she has done well.  She had minimal nausea.  She does feel that her breathing has slightly improved.  Her oxygen demands are decreased.  She is using mainly only 6 L of oxygen.  And doing well with this with good O2 saturations. Says she was able to actually go out to dinner for the first time in a long  time.  She denies any hematuria, chest pain, orthopnea or increased leg swelling.  She is working on weight loss.  She is going to the weight loss center. Labs were done on 12/20 that showed normal Hund blood cell count, platelets and kidney function. Urinalysis was done today.  And is pending.. Plans are for a Cytoxan infusion in 3- 4 weeks from her last infusion.  OV 03/02/2018  Subjective:  Patient ID: Tasha Ballard, female , DOB: 04-Dec-1970 , age 75 y.o. , MRN: [id number] , ADDRESS: [address] [place] [address]   03/02/2018 -   Chief Complaint  Patient presents with  . Follow-up    Appt prior to pt's next Cytoxan infusion.  Pt states she is doing well since last visit. States she can tell that her breathing has improved. Has had some nausea/vomiting and wonders if it is from the [place].   #Morbid Obesity - needs weight loss for transplant - sees Dr Janeal Holmes since dec 2019 #PRogressive NSIP #Chronic hypoxemic resp failure - 2L at rest, 8L with exertion # High risk treatment regimen  -CellCept 2013 through 2019  -Chronic prednisone 2013 -currently taking -Bactrim prophylaxis 2013 --currently taking  - Cytoxan 57m per day 12/23/17 and increaed to 1012mon 12/23/17.On11/19/19. increase to 12521mer day  And switched to IV cytoxan Q4 week cycle - 1st cycle 02/03/18 (s/p portocath 01/26/18)  - Ofev 01/23/18 (approximnate)   HPI ChrDEVON KINGDON 49o. -presents for the above issues.  I personally saw her before Thanksgiving 2019.  After that she is seen by nurse practitioner under my supervision as documented above for Cytoxan infusion.  She has had post Cytoxan infusion labs which showed her [place] count is decreasing but still adequate.  Most recent labs was February 13, 2018 at the weight loss clinic with a [place] count of 5900.  Rest of the labs reviewed and appear adequate and normal.  Including liver function test.  She is developed new onset of intermittent nausea and  vomiting ever since starting nintedanib early December 2019.  This particularly happens with the Christmas meals.  She says this appears at random and is mild and is treated with Zofran.  Given the beneficial effects of nintedanib and progressive non-IPF ILD she is agreed to continue to take this.  She continues on her chronic prednisone and Bactrim prophylaxis.  She is finished 1 cycle of IV Cytoxan February 03, 2018.  She has upcoming second cycle 4 weeks apart on March 06, 2018.  At this point in time she is feeling well.  She feels her ILD has actually improved.  On 2 L at rest she was saturating at 98%.  This is a huge improvement for her.  However when we walked her she still needed 8 L of oxygen with exertion.  She still says this is an improvement because in the past she was using more than 10 L for exertion.  Today my nurse  practitioner will enter the orders for upcoming infusion.  There is no fever.  Morbid obesity: She has seen Dr. Leafy Ro at the weight loss clinic.  I reviewed the notes.  Dietary measures are indicated.  I have written to Dr. Leafy Ro asking about medications to accelerate weight loss given the fact her lung disease is poor prognosis and she is an urgent need for lung transplant and with weight loss being her main barrier.   Obesity: Weight 231#  11/22/17  -> 221# 01/17/2018., -> 222# on 03/02/2018    OV 03/30/2018  Subjective:  Patient ID: Tasha Ballard, female , DOB: [birth year] , age 69 y.o. , MRN: [id number] , ADDRESS: [place] [address]   03/30/2018 -   Chief Complaint  Patient presents with  . Follow-up    PFT peformed today.  Pt states she has been doing okay since last visit.     #Morbid Obesity - needs weight loss for transplant - sees Dr Janeal Holmes since dec 2019 #PRogressive NSIP #Chronic hypoxemic resp failure - 2L at rest, 8L with exertion # High risk treatment regimen  -CellCept 2013 through 2019  -Chronic prednisone 2013 -currently  taking -Bactrim prophylaxis 2013 --currently taking  - Cytoxan Oral  27m per day 12/23/17 and increaed to 1067mon 12/23/17.On11/19/19. increase to 12520mer day   - Cytoxan IV : And switched to IV cytoxan Q4 week cycle   - 1st cycle 02/03/18 (s/p portocath 01/26/18)  = 2nd cycle 03/06/2018  - 3rd cycle  -pending 04/03/2018  - Ofev 01/23/18 (approximnate)   HPI ChrALEIGH GRUNDEN 59o. -presents for follow-up of the above issues.  Her husband is here with her.  In terms of - Obesity: Weight 231#  11/22/17  -> 221# 01/17/2018., -> 222# on 03/02/2018 -> 220# on 03/30/2018  In terms of her progressive NSIP and chronic hypoxemic respiratory failure: She has finished 2 cycles of Cytoxan.  Second cycle was a full dose.  After that she got neutropenic as reviewed on the labs.  During this time she developed a facial rash.  This was on a weekend and over the phone we treated her with doxycycline.  It is largely improved.  Tomorrow she has an appointment with her dermatologist.  I do not know who it is.  Overall she is feeling stable in terms of her IPF.  Her pulmonary function test [facility].  In fact [place] to [place] is even better.  She attributes this to both nintedanib and Cytoxan.  She is using 4 L of oxygen at rest and 8 L with exertion.  Her symptom score is documented below with shortness of breath at 21-22.  She always has a lot of fatigue.  In terms of her medicine tolerance particularly with nintedanib she had intermittent nausea and vomiting particularly after eating certain types of foods.  She is managing this with Zofran.  Symptom score is documented below.  Her most recent cycle of Cytoxan was March 06, 2018.  Her next Cytoxan is April 03, 2018.  I discussed with Dr. MohJulien Nordmanncologist was a lot of experience with Cytoxan.  He said is okay to go with full dose if I felt so.  Patient herself is very keen on getting a full dose of Cytoxan.   She did not have neutropenic fever although she got neutropenic with the second cycle.     ROS  OV 04/26/2018  Subjective:  Patient  ID: Tasha Ballard, female , DOB: [birth year] , age 48 y.o. , MRN: [id number] , ADDRESS: [address] [place] [address]   04/26/2018 -   Chief Complaint  Patient presents with  . Follow-up    Pt states she currently is not feeling too well and believes she might have sinus infection. Pt has had complaints of fatigue, has head congestion, pain in left ear going down to her neck and also has had a sore throat.     #Morbid Obesity - needs weight loss for transplant - sees Dr Janeal Holmes since dec 2019 #PRogressive NSIP #Chronic hypoxemic resp failure - 4L at rest, 8L with exertion # High risk treatment regimen  -CellCept 2013 through 2019  -Chronic prednisone 2013 -currently taking -Bactrim prophylaxis 2013 --currently taking  - Cytoxan Oral  68m per day 12/23/17 and increaed to 1024mon 12/23/17.On11/19/19. increase to 12534mer day   - Cytoxan IV : And switched to IV cytoxan Q4 week cycle   - 1st cycle 02/03/18 (s/p portocath 01/26/18)  = 2nd cycle 03/06/2018  - 3rd cycle  -  04/03/2018  - 4th cycle - 05/01/2018   - Ofev 01/23/18 (approximnate)  - Lung transplant needed: Obesity Barrier :  Weight 231#  11/22/17  -> 221# 01/17/2018., -> 222# on 03/02/2018 -> 220# on 03/30/2018 -> 223# on 04/26/2018    HPI ChrKIMARIA STRUTHERS 2o. - presents for above issues. Here with ? Mom.Since last visit had blood woirk last week and wbc nadir. Had associatd "usual fatigue of low wbc". But then father in law died and she was busy so never recovered from fatigue. Has new wheeze, sinus congestion and increased cruds. Feels she has sinusitis. Wants abx before cycle #4 cytoxan next week. She is worried about COVID-19 ans asked for advice. Otherwise well. Symptom score below    SYMPTOM SCALE - ILD 03/30/2018  04/26/2018   O2 use  4 L at rest, 8 L with exertion Same but  prefers 6L at all times  Shortness of Breath 0 -> 5 scale with 5 being worst (score 6 If unable to do)   At rest 0 1  Simple tasks - showers, clothes change, eating, shaving 1 3.5  Household (dishes, doing bed, laundry) 2.5 4.5  Shopping 3 4.5  Walking level at own pace 3 3.5  Walking keeping up with others of same age 60 576 Walking up Stairs 4 5  Walking up Hill 4 5  Total (40 - 48) Dyspnea Score 21.5 32  How bad is your cough? x 2.5  How bad is your fatigue 5 ("very, lol" 4     Results for Bevis, CHRMARLEA GAMBILLMRN [id number] of 04/26/2018 15:13  Ref. Range 04/17/2018 16:40  WBC Latest Ref Range: 4.0 - 10.5 K/uL 1.9 Repeated and verified X2. (LL)   Results for Iseminger, CHRJENEFER WOERNERMRN [id number] of 04/26/2018 15:13  Ref. Range 04/17/2018 16:40  Creatinine Latest Ref Range: 0.40 - 1.20 mg/dL 0.90      Results for Sellen, CHRRINNAH PEPPELMRN [id number] of 03/30/2018 13:38  Ref. Range 10/05/2017 [facility] 11/08/2017 [facility] 03/30/2018 11:50  FVC-Pre Latest Units: L 0.96 FVC 0.9 L,   1.09  FVC-%Pred-Pre Latest Units: % 31 27% 32  Results for Toon, CHRDARCHELLE NUNESMRN [id number] of 03/30/2018 13:38  Ref. Range 10/05/2017 9/17/20189 03/30/2018 11:50  DLCO unc Latest Units: ml/min/mmHg 5.47  5.86 6.85  DLCO unc % pred Latest  Units: % 28 29% 34    #Morbid Obesity - needs weight loss for transplant - sees Dr Janeal Holmes since dec 2019 #PRogressive NSIP #Chronic hypoxemic resp failure - 2L at rest, 8L with exertion # High risk treatment regimen  -CellCept 2013 through 2019  -Chronic prednisone 2013 -currently taking -Bactrim prophylaxis 2013 --currently taking  - Cytoxan Oral  67m per day 12/23/17 and increaed to 1062mon 12/23/17.On11/19/19. increase to 12565mer day   - Cytoxan IV : And switched to IV cytoxan Q4 week cycle   - 1st cycle 02/03/18 (s/p portocath 01/26/18)  = 2nd cycle 03/06/2018  - 3rd cycle  -pending 04/03/2018  - Ofev 01/23/18  (approximnate)   HPI ChrSIMRANJIT THAYER 58o. -presents for follow-up of the above issues.  Her husband is here with her.  In terms of - Obesity: Weight 231#  11/22/17  -> 221# 01/17/2018., -> 222# on 03/02/2018 -> 220# on 03/30/2018  ROS - per HPI     has a past medical history of Aneurysm ([place] pain, Diabetes mellitus without complication ([place] blood clots, Hypertension, Joint pain, Pulmonary fibrosis (H[facility]), SOB (shortness of breath), Swelling, Thyroid disease, and Vitamin D deficiency.   reports that she has never smoked. She has never used smokeless tobacco.  Past Surgical History:  Procedure Laterality Date  . ABLATION    . IR IMAGING GUIDED PORT INSERTION  01/26/2018  . KNEE SURGERY    . LUNG BIOPSY      Allergies  Allergen Reactions  . Macrobid [Nitrofurantoin Macrocrystal] Nausea And Vomiting  . Adhesive [Tape] Rash    Immunization History  Administered Date(s) Administered  . Influenza,inj,Quad PF,6+ Mos 12/24/2014, 02/05/2016, 01/07/2017, 11/22/2017  . Influenza-Unspecified 12/24/2014, 02/05/2016, 01/07/2017, 11/22/2017  . Pneumococcal Conjugate-13 07/20/2017  . Pneumococcal Polysaccharide-23 06/09/2012  . Tdap 02/19/2011    Family History  Problem Relation Age of Onset  . Heart failure Mother   . Heart disease Mother   . Rheumatologic disease Father      Current Outpatient Medications:  .  apixaban (ELIQUIS) 5 MG TABS tablet, Take 5 mg by mouth 2 (two) times daily., Disp: , Rfl:  .  benzonatate (TESSALON) 100 MG capsule, Take 100 mg by mouth 3 (three) times daily as needed for cough. , Disp: , Rfl:  .  carvedilol (COREG) 6.25 MG tablet, Take 6.25 mg by mouth 2 (two) times daily with a meal., Disp: , Rfl:  .  clindamycin (CLEOCIN T) 1 % lotion, , Disp: , Rfl:  .  furosemide (LASIX) 40 MG tablet, Take 40 mg by mouth See admin instructions. Take 1 tablet (40 mg) daily, may repeat dose if needed for fluid retention., Disp: , Rfl:  .  metFORMIN  (GLUCOPHAGE) 500 MG tablet, Take 500 mg by mouth 2 (two) times daily. , Disp: , Rfl:  .  montelukast (SINGULAIR) 10 MG tablet, Take 10 mg by mouth daily after supper., Disp: , Rfl:  .  OFEV 150 MG CAPS, Take 150 mg by mouth 2 (two) times daily., Disp: , Rfl: 11 .  ondansetron (ZOFRAN) 8 MG tablet, Take 1 tablet (8 mg total) by mouth every 8 (eight) hours as needed for nausea or vomiting., Disp: 45 tablet, Rfl: 3 .  potassium chloride (K-DUR) 10 MEQ tablet, Take 2 tablets (20 mEq total) by mouth 2 (two) times daily., Disp: 120 tablet, Rfl: 2 .  predniSONE (DELTASONE) 10 MG tablet, Take 10-64m55m needed for SOB (Patient taking differently: Take 10 mg  by mouth daily with breakfast. ), Disp: 30 tablet, Rfl: 3 .  spironolactone (ALDACTONE) 100 MG tablet, Take 100 mg by mouth daily., Disp: , Rfl:  .  sulfamethoxazole-trimethoprim (BACTRIM) 400-80 MG tablet, Once a day while on prednisone (Patient taking differently: Take 1 tablet by mouth every evening. ), Disp: 90 tablet, Rfl: 1 .  thyroid (ARMOUR) 90 MG tablet, Take 90 mg by mouth daily before breakfast. , Disp: , Rfl:  .  Vitamin D, Ergocalciferol, (DRISDOL) 1.25 MG (50000 UT) CAPS capsule, TAKE 1 CAPSULE BY MOUTH ONCE WEEKLY, Disp: 3 capsule, Rfl: 0 .  cholecalciferol (VITAMIN D) 25 MCG (1000 UT) tablet, Take 1,000 Units by mouth daily., Disp: , Rfl:  .  hydrocortisone ointment 0.5 %, Apply 1 application topically 2 (two) times daily. (Patient not taking: Reported on 04/26/2018), Disp: 30 g, Rfl: 0 .  PROAIR HFA 108 (90 Base) MCG/ACT inhaler, Inhale 2 puffs into the lungs every 6 (six) hours as needed for wheezing or shortness of breath., Disp: , Rfl: 4      Objective:   Vitals:   04/26/18 1423  BP: 122/78  Pulse: (!) 102  SpO2: 93%  Weight: 223 lb 3.2 oz (101.2 kg)  Height: 5' 2" (1.575 m)    Estimated body mass index is 40.82 kg/m as calculated from the following:   Height as of this encounter: 5' 2" (1.575 m).   Weight as of this  encounter: 223 lb 3.2 oz (101.2 kg).  _0 @  Filed Weights   04/26/18 1423  Weight: 223 lb 3.2 oz (101.2 kg)     Physical Exam  General Appearance:    Alert, cooperative, no distress, appears stated age - yes , Deconditioned looking - no , OBESE  - yes, Sitting on Wheelchair -  no  Head:    Normocephalic, without obvious abnormality, atraumatic  Eyes:    PERRL, conjunctiva/corneas clear,  Ears:    Normal TM's and external ear canals, both ears  Nose:   Nares normal, septum midline, mucosa normal, no drainage    or sinus tenderness. OXYGEN ON  - yes . Patient is @ 6L   Throat:   Lips, mucosa, and tongue normal; teeth and gums normal. Cyanosis on lips - no  Neck:   Supple, symmetrical, trachea midline, no adenopathy;    thyroid:  no enlargement/tenderness/nodules; no carotid   bruit or JVD  Back:     Symmetric, no curvature, ROM normal, no CVA tenderness  Lungs:     Distress - no , Wheeze no, Barrell Chest - no, Purse lip breathing - no, Crackles - scatterd +   Chest Wall:    No tenderness or deformity.    Heart:    Regular rate and rhythm, S1 and S2 normal, no rub   or gallop, Murmur - no  Breast Exam:    NOT DONE  Abdomen:     Soft, non-tender, bowel sounds active all four quadrants,    no masses, no organomegaly. Visceral obesity - yes  Genitalia:   NOT DONE  Rectal:   NOT DONE  Extremities:   Extremities - normal, Has Cane - no, Clubbing - no, Edema - no  Pulses:   2+ and symmetric all extremities  Skin:   Stigmata of Connective Tissue Disease - no  Lymph nodes:   Cervical, supraclavicular, and axillary nodes normal  Psychiatric:  Neurologic:   Pleasant - yes, Anxious - no, Flat affect - no  CAm-ICU - neg, Alert and Oriented  x 3 - yes, Moves all 4s - yes, Speech - normal, Cognition - intact           Assessment:       ICD-10-CM   1. Sinus congestion R09.81 Respiratory virus panel  2. Acute sinusitis, recurrence not specified, unspecified location J01.90    3. 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 Chronic respiratory failure with hypoxia (HCC) J96.11   5. Encounter for long-term current use of high risk medication Z79.899   6. High risk medication use Z79.899        Plan:     Patient Instructions  Acute sinusitis  - await RVP panel  - Take doxycycline 139m po twice daily x 7 days; take after meals and avoid sunlight   ILD (interstitial lung disease) (HCC) NSIP (nonspecific interstitial pneumonia) (HCC) Chronic respiratory failure with hypoxia ([place] Encounter for long-term current use of high risk medication High risk medication use  symptoms worse since last  Visit but suspect due to sinus issues  - continue ofev daily - do cbc, bmet, lft, mag, phos 04/26/2018  - if labs ok , RVP panel negative and responding to doxy then ok for cycle #4 cytoxan on 05/01/2018 (will do labs and orders on morning of)  - will do full dose - continue baseline prednisone and 3x/week of bactrim - continue o2 4L at rest, 8L with exertion - stay safe from COVID-19 - take my write up - 6 month ILD-PRO visit 04/26/2018   Drug-induced nausea and vomiting - improved - due to ofev - glad you are managing this with zofran as needed and dietary change  Morbid obesity ([place] - continue good work of losing weight  Rash - improved  - what did dermatologist say?  Followup  - 4 weeks to ILD clinic - which is 2 weeks post cycle #4 of cytoxan         SIGNATURE    Dr. MBrand Males M.D., F.C.C.P,  Pulmonary and Critical Care Medicine Staff Physician, CCross[place]Director - Interstitial Lung Disease  Program  Pulmonary [place] [id number] Pager: [phone] If no answer or between  15:00h - 7:00h: call [phone] Telephone: [phone]  3:40 PM 04/26/2018

## 2018-04-26 NOTE — Research (Signed)
Late Entry For [id number]                                                                                                                                                          Title: Chronic Fibrosing Interstitial Lung Disease with Progressive Phenotype Prospective Outcomes (ILD-PRO) Registry ( ClinicalTrials.gov identifier [id number])  Protocol for  04/26/2018  is Version Amendment 4 Dated [id number] Consent for  04/26/2018  is Version advarra approved version date [id number]   Synopsis: To collect data & biological samples to support future research in chronic fibrosing ILDs. Registry will describe current approaches to diagnosis & treatment of chronic fibrosing ILDs, analyze participant characteristics to describe the history of disease, assess QOL from self-administered questionnaires, describe participant interactions with health care systems, describe other ILD treatment practices across institutions, and utilize biological samples linked to well characterized other ILD participants to ID disease biomarkers.   Clinical Research Coordinator  note : This visit for Subject Tasha Ballard with DOB: 1970-04-15 on 04/26/2018 for the above protocol is Visit/Encounter 34monthfollow up and is for purpose of research . The consent for this encounter is under Protocol Version Amendment 4 and  is currently IRB approved. The subject expressed continued interest and consent in continuing as a study subject. Subject confirmed that there was  no change in contact information (e.g. address, telephone, email). Subject thanked for participation in research and contribution to science.   In this visit 04/26/2018 the subject was re consented on the study due to the recent change to amendment 4. The subject and clinical research coordinator went over all of the updates within the amendment and the subject verbalized understanding. After the consenting process the subject was given the PROs questionnaires and completed  protocol blood sample collection. For further documentation on today's encounter please refer to the subject paper source binder.  Signed by  T. CEarly CharsBS, CL[place]03/05/20209:11 AM- Late entry for 04/26/2018

## 2018-04-26 NOTE — Addendum Note (Signed)
Addended by: Suzzanne Cloud E on: 04/26/2018 03:47 PM   Modules accepted: Orders

## 2018-04-27 ENCOUNTER — Telehealth: Payer: Self-pay | Admitting: Internal Medicine

## 2018-04-27 NOTE — Telephone Encounter (Signed)
Advised pt of results. Pt understood and nothing further is needed.   Called pt and advised message from the provider. Pt understood and verbalized understanding. Nothing further is needed.

## 2018-04-27 NOTE — Telephone Encounter (Signed)
LEt Tasha Ballard know  1) resp virus panel negative  2) blood work  - wbc come up nicely - hgb fine - issue is creatinine up   Plan  - if on lasix stop or halve - definitely stop potassium  - will need to see creat on day of infusion to decide dosing   Thanks   LABS    PULMONARY No results for input(s): PHART, PCO2ART, PO2ART, HCO3, TCO2, O2SAT in the last 168 hours.  Invalid input(s): PCO2, PO2  CBC Recent Labs  Lab 04/26/18 1547  HGB 11.8*  HCT 36.3  WBC 14.4*  PLT 222.0    COAGULATION No results for input(s): INR in the last 168 hours.  CARDIAC  No results for input(s): TROPONINI in the last 168 hours. No results for input(s): PROBNP in the last 168 hours.   CHEMISTRY Recent Labs  Lab 04/26/18 1547  NA 136  K 3.9  CL 91*  CO2 35*  GLUCOSE 99  BUN 20  CREATININE 1.24*  CALCIUM 9.7   Estimated Creatinine Clearance: 61.1 mL/min (A) (by C-G formula based on SCr of 1.24 mg/dL (H)).   LIVER Recent Labs  Lab 04/26/18 1547  AST 15  ALT 19  ALKPHOS 68  BILITOT 0.3  PROT 6.9  ALBUMIN 4.1     INFECTIOUS No results for input(s): LATICACIDVEN, PROCALCITON in the last 168 hours.   ENDOCRINE CBG (last 3)  No results for input(s): GLUCAP in the last 72 hours.       IMAGING x48h  - image(s) personally visualized  -   highlighted in bold No results found.

## 2018-05-01 ENCOUNTER — Encounter (HOSPITAL_COMMUNITY)
Admission: RE | Admit: 2018-05-01 | Discharge: 2018-05-01 | Disposition: A | Discharge status: Home / Self Care | Payer: BLUE CROSS/BLUE SHIELD | Source: Ambulatory Visit | Attending: Internal Medicine | Admitting: Internal Medicine

## 2018-05-01 ENCOUNTER — Other Ambulatory Visit (INDEPENDENT_AMBULATORY_CARE_PROVIDER_SITE_OTHER): Payer: Self-pay | Admitting: Family Medicine

## 2018-05-01 ENCOUNTER — Encounter (HOSPITAL_COMMUNITY): Payer: Self-pay

## 2018-05-01 DIAGNOSIS — J8489 Other specified interstitial pulmonary diseases: Secondary | ICD-10-CM | POA: Insufficient documentation

## 2018-05-01 DIAGNOSIS — E559 Vitamin D deficiency, unspecified: Secondary | ICD-10-CM

## 2018-05-01 DIAGNOSIS — J84112 Idiopathic pulmonary fibrosis: Secondary | ICD-10-CM | POA: Diagnosis not present

## 2018-05-01 DIAGNOSIS — Z79899 Other long term (current) drug therapy: Secondary | ICD-10-CM | POA: Insufficient documentation

## 2018-05-01 LAB — CBC
HEMATOCRIT: 36.3 % (ref 36.0–46.0)
Hemoglobin: 10.6 g/dL — ABNORMAL LOW (ref 12.0–15.0)
MCH: 27.5 pg (ref 26.0–34.0)
MCHC: 29.2 g/dL — ABNORMAL LOW (ref 30.0–36.0)
MCV: 94 fL (ref 80.0–100.0)
Platelets: 227 10*3/uL (ref 150–400)
RBC: 3.86 MIL/uL — ABNORMAL LOW (ref 3.87–5.11)
RDW: 18.1 % — ABNORMAL HIGH (ref 11.5–15.5)
WBC: 11.4 10*3/uL — AB (ref 4.0–10.5)
nRBC: 0 % (ref 0.0–0.2)

## 2018-05-01 LAB — URINALYSIS, ROUTINE W REFLEX MICROSCOPIC
Bilirubin Urine: NEGATIVE
Glucose, UA: NEGATIVE mg/dL
Hgb urine dipstick: NEGATIVE
Ketones, ur: 5 mg/dL — AB
Nitrite: NEGATIVE
PH: 5 (ref 5.0–8.0)
Protein, ur: NEGATIVE mg/dL
Specific Gravity, Urine: 1.024 (ref 1.005–1.030)

## 2018-05-01 LAB — PHOSPHORUS: Phosphorus: 3.5 mg/dL (ref 2.5–4.6)

## 2018-05-01 LAB — HEPATIC FUNCTION PANEL
ALT: 18 U/L (ref 0–44)
AST: 22 U/L (ref 15–41)
Albumin: 3.1 g/dL — ABNORMAL LOW (ref 3.5–5.0)
Alkaline Phosphatase: 57 U/L (ref 38–126)
BILIRUBIN TOTAL: 0.3 mg/dL (ref 0.3–1.2)
Bilirubin, Direct: <0.1 mg/dL (ref 0.0–0.2)
Total Protein: 6.1 g/dL — ABNORMAL LOW (ref 6.5–8.1)

## 2018-05-01 LAB — COMPREHENSIVE METABOLIC PANEL
ALK PHOS: 58 U/L (ref 38–126)
ALT: 18 U/L (ref 0–44)
AST: 22 U/L (ref 15–41)
Albumin: 3.2 g/dL — ABNORMAL LOW (ref 3.5–5.0)
Anion gap: 11 (ref 5–15)
BUN: 16 mg/dL (ref 6–20)
CALCIUM: 8.8 mg/dL — AB (ref 8.9–10.3)
CO2: 30 mmol/L (ref 22–32)
CREATININE: 0.86 mg/dL (ref 0.44–1.00)
Chloride: 94 mmol/L — ABNORMAL LOW (ref 98–111)
GFR calc Af Amer: >60 mL/min (ref >60)
GFR calc non Af Amer: >60 mL/min (ref >60)
Glucose, Bld: 200 mg/dL — ABNORMAL HIGH (ref 70–99)
Potassium: 3.3 mmol/L — ABNORMAL LOW (ref 3.5–5.1)
Sodium: 135 mmol/L (ref 135–145)
TOTAL PROTEIN: 6.1 g/dL — AB (ref 6.5–8.1)
Total Bilirubin: 0.3 mg/dL (ref 0.3–1.2)

## 2018-05-01 LAB — MAGNESIUM: Magnesium: 1.9 mg/dL (ref 1.7–2.4)

## 2018-05-01 MED ORDER — SODIUM CHLORIDE 0.9 % IV SOLN
500.0000 mg | Freq: Once | INTRAVENOUS | Status: AC
  Administered 2018-05-01: 500 mg via INTRAVENOUS
  Filled 2018-05-01: qty 5

## 2018-05-01 MED ORDER — ONDANSETRON HCL 8 MG PO TABS
8.0000 mg | ORAL_TABLET | Freq: Once | ORAL | Status: AC
  Administered 2018-05-01: 8 mg via ORAL
  Filled 2018-05-01: qty 1

## 2018-05-01 MED ORDER — HEPARIN SOD (PORK) LOCK FLUSH 100 UNIT/ML IV SOLN
500.0000 [IU] | INTRAVENOUS | Status: DC
  Administered 2018-05-01: 500 [IU] via INTRAVENOUS
  Filled 2018-05-01: qty 5

## 2018-05-01 MED ORDER — POTASSIUM CHLORIDE CRYS ER 20 MEQ PO TBCR
40.0000 meq | EXTENDED_RELEASE_TABLET | ORAL | Status: AC
  Administered 2018-05-01: 40 meq via ORAL
  Filled 2018-05-01: qty 2

## 2018-05-01 MED ORDER — ONDANSETRON HCL 8 MG PO TABS
8.0000 mg | ORAL_TABLET | ORAL | Status: AC
  Administered 2018-05-01: 8 mg via ORAL
  Filled 2018-05-01: qty 1

## 2018-05-01 MED ORDER — DEXTROSE-NACL 5-0.45 % IV SOLN: INTRAVENOUS | Status: DC

## 2018-05-01 MED ORDER — ONDANSETRON HCL 8 MG PO TABS
8.0000 mg | ORAL_TABLET | ORAL | Status: DC
  Filled 2018-05-01: qty 1

## 2018-05-01 MED ORDER — SODIUM CHLORIDE 0.9 % IV SOLN
2000.0000 mg | Freq: Once | INTRAVENOUS | Status: AC
  Administered 2018-05-01: 2000 mg via INTRAVENOUS
  Filled 2018-05-01: qty 100

## 2018-05-01 MED ORDER — HYDROCORTISONE NA SUCCINATE PF 100 MG IJ SOLR
100.0000 mg | INTRAMUSCULAR | Status: DC
  Administered 2018-05-01: 100 mg via INTRAVENOUS
  Filled 2018-05-01: qty 2

## 2018-05-01 MED ORDER — MAGNESIUM SULFATE IN D5W 1-5 GM/100ML-% IV SOLN
1.0000 g | INTRAVENOUS | Status: AC
  Administered 2018-05-01: 1 g via INTRAVENOUS
  Filled 2018-05-01: qty 100

## 2018-05-01 MED ORDER — DEXTROSE-NACL 5-0.45 % IV SOLN
INTRAVENOUS | Status: DC
  Administered 2018-05-01: 08:00:00 via INTRAVENOUS

## 2018-05-11 ENCOUNTER — Telehealth: Payer: Self-pay | Admitting: Internal Medicine

## 2018-05-11 NOTE — Telephone Encounter (Signed)
Can you please find out her next infusion date please?  We might not have to do the 05/25/2018 visit but manage via email to her and blood work

## 2018-05-11 NOTE — Telephone Encounter (Signed)
Looks like the next cytoxan infusion date is scheduled for 05/29/2018

## 2018-05-11 NOTE — Telephone Encounter (Signed)
Called patient unable to reach LMTCB 

## 2018-05-11 NOTE — Telephone Encounter (Signed)
Called patient unable to reach [facility]. Will route this over to MR as [facility]

## 2018-05-15 ENCOUNTER — Ambulatory Visit (INDEPENDENT_AMBULATORY_CARE_PROVIDER_SITE_OTHER): Payer: BLUE CROSS/BLUE SHIELD | Admitting: Physician Assistant

## 2018-05-16 NOTE — Telephone Encounter (Signed)
Cancel 05/25/2018 visit  She will be in touch with me via email esp 05/28/2018  Depending on that will decide on 05/29/2018 CT

## 2018-05-16 NOTE — Telephone Encounter (Signed)
Noted. Appointment has been cancelled. LMTCB

## 2018-05-17 NOTE — Telephone Encounter (Signed)
LMTCB x2 for pt 

## 2018-05-18 NOTE — Telephone Encounter (Signed)
Called patient unable to reach LMTCB 

## 2018-05-18 NOTE — Telephone Encounter (Signed)
Pt is calling back [phone]

## 2018-05-18 NOTE — Telephone Encounter (Signed)
LMTCB x3 for pt.

## 2018-05-18 NOTE — Telephone Encounter (Signed)
LMTCB x1 for pt.  

## 2018-05-18 NOTE — Telephone Encounter (Signed)
Pt is calling back [phone] 

## 2018-05-19 NOTE — Telephone Encounter (Signed)
Spoke with pt. She is aware of MR's response. States that she is going to email MR about whether or not she should go to chemo on 05/29/2018. Nothing further was needed.

## 2018-05-24 ENCOUNTER — Encounter (INDEPENDENT_AMBULATORY_CARE_PROVIDER_SITE_OTHER): Payer: Self-pay

## 2018-05-25 ENCOUNTER — Ambulatory Visit: Payer: BLUE CROSS/BLUE SHIELD | Admitting: Internal Medicine

## 2018-05-25 ENCOUNTER — Encounter: Payer: Self-pay | Admitting: *Deleted

## 2018-05-25 ENCOUNTER — Telehealth: Payer: Self-pay | Admitting: Internal Medicine

## 2018-05-25 DIAGNOSIS — Z79899 Other long term (current) drug therapy: Secondary | ICD-10-CM

## 2018-05-25 DIAGNOSIS — J849 Interstitial pulmonary disease, unspecified: Secondary | ICD-10-CM

## 2018-05-25 MED ORDER — CYCLOPHOSPHAMIDE 25 MG PO CAPS: ORAL_CAPSULE | ORAL | 0 refills | Status: DC

## 2018-05-25 MED ORDER — CYCLOPHOSPHAMIDE 50 MG PO CAPS: 100.0000 mg | ORAL_CAPSULE | Freq: Every day | ORAL | 2 refills | Status: DC

## 2018-05-25 NOTE — Telephone Encounter (Signed)
Patient emailed me and the ILD MD Dr Elisabeth Cara at [facility]. bEcuase of need to physical distance and   Plan  - stop IV cytooxan - start po cytoxan 27m per day and a week later increase to 75mper day and  -> get cbc with diff, bmet, lft -> then will increase to 10068mer day - conitnue other meds - continue hydration  Thanks  MR

## 2018-05-25 NOTE — Telephone Encounter (Signed)
Called patient and gave verbal instruction as to new plan due to social distancing. Also sent plan via my chart. Cytoxan has been sent to pharmacy as directed (phone in). Labs have been ordered Pt placed on lab schedule. Nothing further needed.

## 2018-05-26 MED FILL — CYCLOPHOSPHAMIDE 50 MG CAPS: 50 | 30 days supply | Qty: 60 | Fill #0

## 2018-05-26 MED FILL — CYCLOPHOSPHAMIDE 25 MG CAPS: 25 | 14 days supply | Qty: 35 | Fill #0

## 2018-05-29 ENCOUNTER — Encounter (HOSPITAL_COMMUNITY): Payer: BLUE CROSS/BLUE SHIELD

## 2018-06-07 ENCOUNTER — Other Ambulatory Visit (HOSPITAL_COMMUNITY): Payer: Self-pay | Admitting: General Practice

## 2018-06-09 ENCOUNTER — Telehealth: Payer: Self-pay | Admitting: Internal Medicine

## 2018-06-09 MED ORDER — AMOXICILLIN-POT CLAVULANATE 875-125 MG PO TABS: 1.0000 | ORAL_TABLET | Freq: Two times a day (BID) | ORAL | 0 refills | Status: DC

## 2018-06-09 NOTE — Telephone Encounter (Signed)
Rx augmentin. Make sure she is taking daily antihistamine (claritin or zyrtec), flonase and mucinex.

## 2018-06-09 NOTE — Telephone Encounter (Signed)
Relayed recommendations to pt.  Nothing further is needed.

## 2018-06-09 NOTE — Telephone Encounter (Signed)
Primary Pulmonologist: MR   Last office visit and with whom: 04/26/18 MR What do we see them for (pulmonary problems): ILD Last OV assessment/plan:  Instructions   Acute sinusitis  - await RVP panel  - Take doxycycline 143m po twice daily x 7 days; take after meals and avoid sunlight   ILD (interstitial lung disease) (HCC) NSIP (nonspecific interstitial pneumonia) ([place] Chronic respiratory failure with hypoxia ([place] Encounter for long-term current use of high risk medication High risk medication use  symptoms worse since last  Visit but suspect due to sinus issues  - continue ofev daily - do cbc, bmet, lft, mag, phos 04/26/2018  - if labs ok , RVP panel negative and responding to doxy then ok for cycle #4 cytoxan on 05/01/2018 (will do labs and orders on morning of)             - will do full dose - continue baseline prednisone and 3x/week of bactrim - continue o2 4L at rest, 8L with exertion - stay safe from COVID-19 - take my write up - 6 month ILD-PRO visit 04/26/2018   Drug-induced nausea and vomiting - improved - due to ofev - glad you are managing this with zofran as needed and dietary change  Morbid obesity ([place] - continue good work of losing weight  Rash - improved  - what did dermatologist say?  Followup  - 4 weeks to ILD clinic - which is 2 weeks post cycle #4 of cytoxan            After Visit Summary (Printed 04/26/2018)    Was appointment offered to patient (explain)?  no   Reason for call: for 1 1/2 week pt has nasal stuffiness with green mucus.  Green mucus from cough also.  Denies fever.  N/V today from drainage down throat.  Tried advil cold and sinus and mucinex.  (examples of things to ask: : When did symptoms start? Fever? Cough? Productive? Color to sputum? More sputum than usual? Wheezing? Have you needed increased oxygen? Are you taking your respiratory medications? What over the counter measures have you tried?)

## 2018-06-12 ENCOUNTER — Other Ambulatory Visit (INDEPENDENT_AMBULATORY_CARE_PROVIDER_SITE_OTHER): Payer: BLUE CROSS/BLUE SHIELD

## 2018-06-12 ENCOUNTER — Other Ambulatory Visit: Payer: BLUE CROSS/BLUE SHIELD

## 2018-06-12 ENCOUNTER — Other Ambulatory Visit: Payer: Self-pay

## 2018-06-12 DIAGNOSIS — J849 Interstitial pulmonary disease, unspecified: Secondary | ICD-10-CM

## 2018-06-12 DIAGNOSIS — Z79899 Other long term (current) drug therapy: Secondary | ICD-10-CM

## 2018-06-12 LAB — CBC WITH DIFFERENTIAL/PLATELET
Basophils Absolute: 0.1 10*3/uL (ref 0.0–0.1)
Basophils Relative: 1.1 % (ref 0.0–3.0)
Eosinophils Absolute: 0.3 10*3/uL (ref 0.0–0.7)
Eosinophils Relative: 3.1 % (ref 0.0–5.0)
HCT: 36.8 % (ref 36.0–46.0)
Hemoglobin: 12 g/dL (ref 12.0–15.0)
Lymphocytes Relative: 9.3 % — ABNORMAL LOW (ref 12.0–46.0)
Lymphs Abs: 1 10*3/uL (ref 0.7–4.0)
MCHC: 32.6 g/dL (ref 30.0–36.0)
MCV: 89 fl (ref 78.0–100.0)
Monocytes Absolute: 0.7 10*3/uL (ref 0.1–1.0)
Monocytes Relative: 6.5 % (ref 3.0–12.0)
Neutro Abs: 8.3 10*3/uL — ABNORMAL HIGH (ref 1.4–7.7)
Neutrophils Relative %: 80 % — ABNORMAL HIGH (ref 43.0–77.0)
Platelets: 282 10*3/uL (ref 150.0–400.0)
RBC: 4.14 Mil/uL (ref 3.87–5.11)
RDW: 16.8 % — ABNORMAL HIGH (ref 11.5–15.5)
WBC: 10.4 10*3/uL (ref 4.0–10.5)

## 2018-06-12 LAB — BASIC METABOLIC PANEL
BUN: 10 mg/dL (ref 6–23)
CO2: 35 mEq/L — ABNORMAL HIGH (ref 19–32)
Calcium: 10.4 mg/dL (ref 8.4–10.5)
Chloride: 93 mEq/L — ABNORMAL LOW (ref 96–112)
Creatinine, Ser: 0.78 mg/dL (ref 0.40–1.20)
GFR: 78.24 mL/min (ref >60.00)
Glucose, Bld: 98 mg/dL (ref 70–99)
Potassium: 3.8 mEq/L (ref 3.5–5.1)
Sodium: 137 mEq/L (ref 135–145)

## 2018-06-12 LAB — HEPATIC FUNCTION PANEL
ALT: 18 U/L (ref 0–35)
AST: 18 U/L (ref 0–37)
Albumin: 3.9 g/dL (ref 3.5–5.2)
Alkaline Phosphatase: 68 U/L (ref 39–117)
Bilirubin, Direct: 0 mg/dL (ref 0.0–0.3)
Total Bilirubin: 0.3 mg/dL (ref 0.2–1.2)
Total Protein: 6.7 g/dL (ref 6.0–8.3)

## 2018-06-13 ENCOUNTER — Telehealth: Payer: Self-pay | Admitting: Internal Medicine

## 2018-06-13 DIAGNOSIS — Z5181 Encounter for therapeutic drug level monitoring: Secondary | ICD-10-CM

## 2018-06-13 NOTE — Telephone Encounter (Signed)
Labs releates  [name] did you mean cytoxan?

## 2018-06-13 NOTE — Telephone Encounter (Signed)
Pt came into office yesterday, 4/20 to have labwork done which had to be done prior to her receiving the Cytoxan. Pt is currently taking Cytoxan by pill due to COVID per MR. Pt is scheduled to take a dose today.  Pt has been on 19m of Cytoxan but today she is supposed to increase the dose to 1083mas long as labwork is okay.   [name], please advise on the results of pt's labwork so she will know if it is okay for her to take the Cytoxan and increase dose per MR. Thanks!

## 2018-06-13 NOTE — Telephone Encounter (Signed)
Pt already has cytoxan at home  Dragon error , sorry did not catch this .  It is cytoxan , not cellcept . Sorry for dictation error.  No rx was sent for cellcept as patient is on cytoxan and has this at home and is aware of the cytoxan and dosing .  It was never communicated to the patient as it is a clerical error only .   Tomislav Micale NP-C

## 2018-06-13 NOTE — Telephone Encounter (Signed)
Call made to patient, made aware to come in office for repeat labs in 3 weeks. Lab orders placed. Nothing further needed at this time.

## 2018-06-13 NOTE — Telephone Encounter (Signed)
Reviewed labs and are okay to increase Cellcept 114m daily   Wbc /h/H okay . Plt ok  Chem panel , scr and K + nml  LFT nml   She is feeling good, staying home. O2 demands at baseline without increase  Had sinus infection last week, took augmentin , feels much better.   Will have repeat labs with CMET and CBC in 3 weeks   Dr. RChase Callerplease release labs on my chart/sign labs so she can look at on mychart per pt request.   Please contact office for sooner follow up if symptoms do not improve or worsen or seek emergency care

## 2018-06-13 NOTE — Telephone Encounter (Signed)
Returned call to [facility] infusion center. Informed that due to COVID-19 patient is now taking oral cytoxan. They will temporarily take patient off schedule. Nothing further needed.

## 2018-06-26 ENCOUNTER — Encounter (HOSPITAL_COMMUNITY): Payer: BLUE CROSS/BLUE SHIELD

## 2018-06-26 ENCOUNTER — Ambulatory Visit (HOSPITAL_COMMUNITY): Payer: BLUE CROSS/BLUE SHIELD

## 2018-07-02 ENCOUNTER — Telehealth: Payer: BLUE CROSS/BLUE SHIELD | Admitting: Family

## 2018-07-02 DIAGNOSIS — Z719 Counseling, unspecified: Secondary | ICD-10-CM

## 2018-07-02 NOTE — Progress Notes (Signed)
Thank you for the details you included in the comment boxes. Those details are very helpful in determining the best course of treatment for you and help [name] to provide the best care.  Given your symptoms, this is outside the scope of our e-visit program and requires a face-to-face visit with a physical exam and someone with direct knowledge of these medications. As chemo is very specialized, please attempt to contact the person prescribing the chemo first. If you cannot reach this person or office, please be seen face-to-face so that we can do a physical exam and/or laboratory work.   Based on what you shared with me, I feel your condition warrants further evaluation and I recommend that you be seen for a face to face office visit.     NOTE: If you entered your credit card information for this eVisit, you will not be charged. You may see a "hold" on your card for the $35 but that hold will drop off and you will not have a charge processed.  If you are having a true medical emergency please call 911.  If you need an urgent face to face visit, [place] has four urgent care centers for your convenience.    PLEASE NOTE: THE [facility] LOCATIONS AND URGENT CARE CLINICS DO NOT HAVE THE TESTING FOR CORONAVIRUS COVID19 AVAILABLE.  IF YOU FEEL YOU NEED THIS TEST YOU MUST GO TO A TRIAGE LOCATION AT [place]   [email or web address] to reserve your spot online an avoid wait times  [facility] [address], [address] [place], [place] [address] Modified hours of operation: Monday-Friday, 12 PM to 6 PM  Saturday & Sunday 10 AM to 4 PM *Across the street from [place] (New Address!) [address] [place], [place] [address] *Just off [address], across the road from [place] hours of operation: Monday-Friday, 12 PM to 6 PM  Closed Saturday & Sunday  [facility]'s modified hours of operation  will be in effect from May 1 until May 31   The following sites will take your insurance:  . [facility] [place] a Provider at this Location  [address] [place], [place] [address] . 10 am to 8 pm Monday-Friday . 12 pm to 8 pm Saturday-Sunday   . [facility] at [place] a Provider at this Location  [address] [place], [place], [place] [address] . 8 am to 8 pm Monday-Friday . 9 am to 6 pm Saturday . 11 am to 6 pm Sunday   . [facility]gent [facility] at [place] Get Driving Directions  [address] [place],  [address] . 8 am to 8 pm Monday-Friday . 8 am to 4 pm Saturday-Sunday   Your e-visit answers were reviewed by a board certified advanced clinical practitioner to complete your personal care plan.  Thank you for using e-Visits.

## 2018-07-03 ENCOUNTER — Encounter: Payer: Self-pay | Admitting: Adult Health

## 2018-07-03 ENCOUNTER — Other Ambulatory Visit: Payer: Self-pay

## 2018-07-03 ENCOUNTER — Ambulatory Visit (INDEPENDENT_AMBULATORY_CARE_PROVIDER_SITE_OTHER): Payer: BLUE CROSS/BLUE SHIELD | Admitting: Adult Health

## 2018-07-03 ENCOUNTER — Telehealth: Payer: Self-pay | Admitting: Adult Health

## 2018-07-03 DIAGNOSIS — B372 Candidiasis of skin and nail: Secondary | ICD-10-CM

## 2018-07-03 DIAGNOSIS — J849 Interstitial pulmonary disease, unspecified: Secondary | ICD-10-CM

## 2018-07-03 MED ORDER — FLUCONAZOLE 100 MG PO TABS: 100.0000 mg | ORAL_TABLET | Freq: Every day | ORAL | 0 refills | Status: AC

## 2018-07-03 NOTE — Progress Notes (Signed)
Virtual Visit via Telephone Note  I connected with Tasha Ballard on 07/03/18 at  4:00 PM EDT by telephone and verified that I am speaking with the correct person using two identifiers.  Location: Patient: Home  Provider: Office    I discussed the limitations, risks, security and privacy concerns of performing an evaluation and management service by telephone and the availability of in person appointments. I also discussed with the patient that there may be a patient responsible charge related to this service. The patient expressed understanding and agreed to proceed.   History of Present Illness: 48 year old female never smoker seen for pulmonary consult November 22, 2017 to establish for evaluation of interstitial lung disease.She has been followed at [facility]. 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 At that time she was placed on CellCept. In 2016 had a clinical decline. And started on oxygen at 2 L. In 2016.Since this time patient has had a progressive decline with increased oxygen demands. She is currently on 6-8 L at rest and 15 L with activity. Received Rituxaninfusions x2 in April and May 2019. Patient was seen at [facility] Dr. Threasa Alpha. In September 2019. 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 Started on IV Cytoxan 01/2018 .   Televisit today is for an acute office visit for rash/skin irritation .  Patient complains that about 3 weeks ago she started getting irritation along the perineal area.  It first started at red irritated skin along her panty line.  She had an episode of diarrhea and then after that felt that she had some irritation.  She tried some soothing creams and medicated creams without any significant improvement.  Symptoms  seem to have worsened and now are progressing to her bottom as well.  Patient denies any blisters sores discharge.  She states about a week ago she thought she might of had a urinary tract infection so she took some leftover antibiotics this did not help and she says her skin is been worse since then. She denies any new skin products.  She denies fever back pain abdominal pain nausea vomiting or diarrhea.  As above patient has very severe interstitial lung disease.  Says overall her breathing has been doing well.  Her oxygen demands have not increased.  Her activity tolerance has remained the same.  Patient had been on IV Cytoxan and been doing better however this is old hold due to COVID-19.  She is currently taking oral Cytoxan.  She also remains on O FEV.  Patient says she has no rash anywhere else.  She has no skin peeling no bruising no petechiae.  She has no facial swelling blisters on her mouth or tongue or lips.  No visual changes.     Observations/Objective:  Investigations at [facility] -November 09, 2017: Echocardiogram ejection fraction 70-75% with grade 2 diastolic dysfunction. Right ventricle was mildly enlarged but global RV function was felt to be normal. No pericardial effusion no obvious right-to-left shunt at rest or cough or Valsalva on agitated saline contrast exam and stage I right ventricular diastolic dysfunction. Pulmonary artery systolic pressure at 45 mmHg  -Lung function November 08, 2017: FVC 0.9 L / 27%, DLCO 5.86/29%  - HRCT 11/08/17 Rt diaphragm elevation ? Eventration v paresis.  Esophagogram November 11, 2017 - > 11/11/17 Natil Jewsh - Normal  Dr.  Threasa Alpha at [facility]She was started on cytoxan 53m per day 12/23/17 and increaed to 1047mon 12/23/17.On11/19/19. increase to 125106mer day .  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  Patient does not have any vaginal  discharge.  No further urinary symptoms.  With Cytoxan you can have Stevens-Johnson syndrome and toxic epidermal necrolysis , none of her symptoms above appear to be consistent with this.  Have advised her we will check a urine and a urine culture. We will place her on Diflucan x1 week.  She is aware that she cannot take Cytoxan while taking Diflucan.  She will wait 24 hours and then begin Diflucan for 1 week when she has been off of the Diflucan for 24 hours she may resume her Cytoxan dosing.  She also has labs that are pending and will have those done tomorrow as well.  Have advised her if this rash worsens increases or does not improve that she will need to have an office visit/referral to dermatology and/or gynecology. We will plan on close follow-up in 1 week.  ILD on cytoxan and OFEV-appears stable   Routine labs tomorrow   O2 RF - stable   Plan  Patient Instructions  Begin Diflucan 100 mg daily for 7 days. While taking Diflucan you need to hold her Cytoxan, need to wait 24 hours when she will finish her Cytoxan then began Diflucan.  Once you have finished Diflucan you need to wait 24 hours before beginning Cytoxan back. Return tomorrow for lab work, we will also check a urine and urine culture Cool compresses to area as tolerated If this rash is not improving or worsen she will need a referral to dermatology and gynecology. Please contact office for sooner follow up if symptoms do not improve or worsen or seek emergency care  Follow-up in 1 week with a video visit and as needed      Follow Up Instructions: Follow up in 1 week and As needed   Please contact office for sooner follow up if symptoms do not improve or worsen or seek emergency care     I discussed the assessment and treatment plan with the patient. The patient was provided an opportunity to ask questions and all were answered. The patient agreed with the plan and demonstrated an understanding of the instructions.   The  patient was advised to call back or seek an in-person evaluation if the symptoms worsen or if the condition fails to improve as anticipated.  I provided 25  minutes of non-face-to-face time during this encounter.   TamRexene EdisonP

## 2018-07-03 NOTE — Telephone Encounter (Signed)
LMOM TCB x1 TP would like to speak with patient

## 2018-07-03 NOTE — Patient Instructions (Signed)
Begin Diflucan 100 mg daily for 7 days. While taking Diflucan you need to hold her Cytoxan, need to wait 24 hours when she will finish her Cytoxan then began Diflucan.  Once you have finished Diflucan you need to wait 24 hours before beginning Cytoxan back. Return tomorrow for lab work, we will also check a urine and urine culture Cool compresses to area as tolerated If this rash is not improving or worsen she will need a referral to dermatology and gynecology. Please contact office for sooner follow up if symptoms do not improve or worsen or seek emergency care  Follow-up in 1 week with a video visit and as needed

## 2018-07-03 NOTE — Telephone Encounter (Signed)
Received the following message from the patient:   ----- Message -----  From: [name]  Sent: 5/10/20209:15 AM EDT  To: Rexene Edison, NP Subject: Non-Urgent Medical Question  I tried to make a virtual visit, but it said I needed to come in. I have got a burning and skin pilling in my private area, it started around my bottom about 3 weeks ago and then spread. I got some old cream from my father in law who had chemoburn a few months ago.It was helping, but now it is burning like crazy and seems to have spread to the top and sides. Can you advis who I need to see and where? I know Dr Rhae Lerner i snot in the office. Thanks, Tasha Ballard   I advised the patient to follow up with her PCP. She stated that she would.   She also stated that she wanted to know if this was possibly a side effect of Ofev. Advised her I would check with a provider.   TP, please advise. Thank you!

## 2018-07-03 NOTE — Telephone Encounter (Signed)
Please make a televisit .

## 2018-07-04 ENCOUNTER — Telehealth: Payer: Self-pay | Admitting: Internal Medicine

## 2018-07-04 ENCOUNTER — Other Ambulatory Visit (INDEPENDENT_AMBULATORY_CARE_PROVIDER_SITE_OTHER): Payer: BLUE CROSS/BLUE SHIELD

## 2018-07-04 DIAGNOSIS — J849 Interstitial pulmonary disease, unspecified: Secondary | ICD-10-CM | POA: Diagnosis not present

## 2018-07-04 DIAGNOSIS — Z5181 Encounter for therapeutic drug level monitoring: Secondary | ICD-10-CM

## 2018-07-04 DIAGNOSIS — B372 Candidiasis of skin and nail: Secondary | ICD-10-CM

## 2018-07-04 LAB — URINALYSIS, ROUTINE W REFLEX MICROSCOPIC
Bilirubin Urine: NEGATIVE
Hgb urine dipstick: NEGATIVE
Ketones, ur: NEGATIVE
Leukocytes,Ua: NEGATIVE
Nitrite: NEGATIVE
Specific Gravity, Urine: 1.02 (ref 1.000–1.030)
Total Protein, Urine: NEGATIVE
Urine Glucose: NEGATIVE
Urobilinogen, UA: 0.2 (ref 0.0–1.0)
pH: 7 (ref 5.0–8.0)

## 2018-07-04 LAB — COMPREHENSIVE METABOLIC PANEL
ALT: 15 U/L (ref 0–35)
AST: 16 U/L (ref 0–37)
Albumin: 3.4 g/dL — ABNORMAL LOW (ref 3.5–5.2)
Alkaline Phosphatase: 52 U/L (ref 39–117)
BUN: 12 mg/dL (ref 6–23)
CO2: 31 mEq/L (ref 19–32)
Calcium: 8.7 mg/dL (ref 8.4–10.5)
Chloride: 101 mEq/L (ref 96–112)
Creatinine, Ser: 0.67 mg/dL (ref 0.40–1.20)
GFR: 93.22 mL/min (ref >60.00)
Glucose, Bld: 88 mg/dL (ref 70–99)
Potassium: 4 mEq/L (ref 3.5–5.1)
Sodium: 139 mEq/L (ref 135–145)
Total Bilirubin: 0.3 mg/dL (ref 0.2–1.2)
Total Protein: 5.7 g/dL — ABNORMAL LOW (ref 6.0–8.3)

## 2018-07-04 LAB — CBC
HCT: 35.2 % — ABNORMAL LOW (ref 36.0–46.0)
Hemoglobin: 11.6 g/dL — ABNORMAL LOW (ref 12.0–15.0)
MCHC: 32.9 g/dL (ref 30.0–36.0)
MCV: 90.8 fl (ref 78.0–100.0)
Platelets: 239 10*3/uL (ref 150.0–400.0)
RBC: 3.88 Mil/uL (ref 3.87–5.11)
RDW: 18.1 % — ABNORMAL HIGH (ref 11.5–15.5)
WBC: 6.3 10*3/uL (ref 4.0–10.5)

## 2018-07-04 NOTE — Addendum Note (Signed)
Addended by: Parke Poisson E on: 07/04/2018 09:22 AM   Modules accepted: Orders

## 2018-07-04 NOTE — Progress Notes (Signed)
Called spoke with patient, advised of lab results / recs as stated by Parrett NP.  Pt verbalized understanding and denied any questions.

## 2018-07-04 NOTE — Telephone Encounter (Signed)
Called spoke with patient, scheduled 1 week videovisit per Parrett NP's request Appt scheduled for 5.18.2020 @ 1430  Nothing further needed; will sign off

## 2018-07-04 NOTE — Telephone Encounter (Signed)
TP spoke with patient during video visit Nothing further needed; will sign off

## 2018-07-06 ENCOUNTER — Telehealth: Payer: Self-pay | Admitting: Internal Medicine

## 2018-07-06 LAB — URINE CULTURE
MICRO NUMBER:: 466292
SPECIMEN QUALITY:: ADEQUATE

## 2018-07-06 NOTE — Telephone Encounter (Signed)
Pt states she was just to call back to f/u on how she was doing. She states much better. Nothing further needed Message routed to [place].

## 2018-07-07 NOTE — Telephone Encounter (Signed)
Appears nothing further need in this informational encounter.  Will close encounter today.

## 2018-07-10 ENCOUNTER — Telehealth (INDEPENDENT_AMBULATORY_CARE_PROVIDER_SITE_OTHER): Payer: BLUE CROSS/BLUE SHIELD | Admitting: Adult Health

## 2018-07-10 ENCOUNTER — Other Ambulatory Visit: Payer: Self-pay | Admitting: Adult Health

## 2018-07-10 DIAGNOSIS — J849 Interstitial pulmonary disease, unspecified: Secondary | ICD-10-CM

## 2018-07-10 DIAGNOSIS — B372 Candidiasis of skin and nail: Secondary | ICD-10-CM

## 2018-07-10 NOTE — Progress Notes (Signed)
Virtual Visit via Video Note  I connected with Tasha Ballard on 07/11/18 at  2:30 PM EDT by a video enabled telemedicine application and verified that I am speaking with the correct person using two identifiers.  Location: Patient: Home  Provider: Office    I discussed the limitations of evaluation and management by telemedicine and the availability of in person appointments. The patient expressed understanding and agreed to proceed.  History of Present Illness: 48 year old female never smoker seen for pulmonary consult November 22, 2017 to establish for evaluation of interstitial lung disease.She has been followed at [facility] 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 At that time she was placed on CellCept. In 2016 had a clinical decline. And started on oxygen at 2 L. In 2016.Since this time patient has had a progressive decline with increased oxygen demands. She is currently on 6-8 L at rest and 15 L with activity. Received Rituxaninfusions x2 in April and May 2019. Patient was seen at [facility] Dr. Threasa Ballard. In September 2019. 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 Started on IV Cytoxan 01/2018 . Changed to oral cytoxan 04/2018 due to COVID 19  precautions  Today's Televisit is a follow up for a rash. Visit 1 week ago for perineal rash /irriation that began 3 weeks ago. Diagnosed with cutaneous candida. Started on Diflucan for 7 days. Cytoxan held while taking. Urinalysis and urine culture neg except for yeast.  Says she is some better. Rash is decreased but not totally gone. Has irritation along panty line and buttock area. No abdominal pain, fever, chest pain , bloody stools.  Labs showed Normal CBC . H/H stable.   Has ILD , has been stable  without flare of cough or dyspnea. No change in activity level or oxygen demands. Currently on Oxygen 7 l/m .  Remains on OFEV Twice daily  . On oral cytoxan (was on IV cytoxan but on hold due to COVID 19.  Doing better. Has 2 days leftr.  7l/m  Activity level is same.  No flare of cough .  Rash is better.  O2 sats  Weight    Observations/Objective: Investigations at [facility] -November 09, 2017: Echocardiogram ejection fraction 70-75% with grade 2 diastolic dysfunction. Right ventricle was mildly enlarged but global RV function was felt to be normal. No pericardial effusion no obvious right-to-left shunt at rest or cough or Valsalva on agitated saline contrast exam and stage I right ventricular diastolic dysfunction. Pulmonary artery systolic pressure at 45 mmHg  -Lung function November 08, 2017: FVC 0.9 L / 27%, DLCO 5.86/29%  - HRCT 11/08/17 Rt diaphragm elevation ? Eventration v paresis.  Esophagogram November 11, 2017 - > 11/11/17 Tasha Ballard - Normal  Dr. [facility]re[facility] at [facility].She was started on cytoxan 44m per day 12/23/17 and increaed to 1076mon 12/23/17.On11/19/19. increase to 12516mer day .  Assessment and Plan: Rash/skin irritation along perineal area. Most consistent with cutaneous candidiasis . Improving on Diflucan . She is finish 1 week course in full. Labs and urine cx unrevealing. If does not resolve or worsens will need referral to PCP or GYN  - Plan  Patient Instructions  Finish Diflucan . While taking Diflucan you need to hold her Cytoxan, when  finish Diflucan you need to wait 24 hours before beginning Cytoxan back. If this  rash is not improving or worsen she will need a referral to dermatology /PCP and /or gynecology. Please contact office for sooner follow up if symptoms do not improve or worsen or seek emergency care  Follow-up in 4 weeks with Dr. [name] and As needed   Please contact office for sooner follow up if symptoms  do not improve or worsen or seek emergency care     ILD -stable on current regimen  Follow up in 4 weeks and As needed    Follow Up Instructions: Follow up in 4 weeks and As needed     I discussed the assessment and treatment plan with the patient. The patient was provided an opportunity to ask questions and all were answered. The patient agreed with the plan and demonstrated an understanding of the instructions.   The patient was advised to call back or seek an in-person evaluation if the symptoms worsen or if the condition fails to improve as anticipated.  I provided 21 minutes of non-face-to-face time during this encounter.   Tasha Edison, NP

## 2018-07-11 DIAGNOSIS — R739 Hyperglycemia, unspecified: Secondary | ICD-10-CM | POA: Insufficient documentation

## 2018-07-11 NOTE — Telephone Encounter (Signed)
Is this appropriate for refill ?

## 2018-07-11 NOTE — Patient Instructions (Signed)
Finish Diflucan . While taking Diflucan you need to hold her Cytoxan, when  finish Diflucan you need to wait 24 hours before beginning Cytoxan back. If this rash is not improving or worsen she will need a referral to dermatology /PCP and /or gynecology. Please contact office for sooner follow up if symptoms do not improve or worsen or seek emergency care  Follow-up in 4 weeks with Dr. [name] and As needed   Please contact office for sooner follow up if symptoms do not improve or worsen or seek emergency care

## 2018-07-13 ENCOUNTER — Telehealth: Payer: Self-pay | Admitting: Adult Health

## 2018-07-13 NOTE — Telephone Encounter (Signed)
Spoke with [place]. She wanted to know if the patient will continue to take the Cytoxan at home. Reviewed her last OV note with TP on 5/18, she is still on Cytoxan at home.   She verbalized understanding. Nothing further needed at time of call.

## 2018-07-23 ENCOUNTER — Telehealth: Payer: Self-pay | Admitting: Pulmonary Disease

## 2018-07-23 ENCOUNTER — Emergency Department (HOSPITAL_COMMUNITY)
Admission: EM | Admit: 2018-07-23 | Discharge: 2018-07-23 | Disposition: A | Discharge status: Home / Self Care | Payer: BLUE CROSS/BLUE SHIELD | Attending: Emergency Medicine | Admitting: Emergency Medicine

## 2018-07-23 ENCOUNTER — Encounter (HOSPITAL_COMMUNITY): Payer: Self-pay

## 2018-07-23 ENCOUNTER — Other Ambulatory Visit: Payer: Self-pay

## 2018-07-23 DIAGNOSIS — Z7901 Long term (current) use of anticoagulants: Secondary | ICD-10-CM | POA: Diagnosis not present

## 2018-07-23 DIAGNOSIS — I10 Essential (primary) hypertension: Secondary | ICD-10-CM | POA: Diagnosis not present

## 2018-07-23 DIAGNOSIS — E039 Hypothyroidism, unspecified: Secondary | ICD-10-CM | POA: Insufficient documentation

## 2018-07-23 DIAGNOSIS — Z7984 Long term (current) use of oral hypoglycemic drugs: Secondary | ICD-10-CM | POA: Diagnosis not present

## 2018-07-23 DIAGNOSIS — E119 Type 2 diabetes mellitus without complications: Secondary | ICD-10-CM | POA: Insufficient documentation

## 2018-07-23 DIAGNOSIS — Z79899 Other long term (current) drug therapy: Secondary | ICD-10-CM | POA: Insufficient documentation

## 2018-07-23 DIAGNOSIS — R040 Epistaxis: Secondary | ICD-10-CM | POA: Insufficient documentation

## 2018-07-23 LAB — COMPREHENSIVE METABOLIC PANEL
ALT: 19 U/L (ref 0–44)
AST: 19 U/L (ref 15–41)
Albumin: 3.1 g/dL — ABNORMAL LOW (ref 3.5–5.0)
Alkaline Phosphatase: 60 U/L (ref 38–126)
Anion gap: 8 (ref 5–15)
BUN: 17 mg/dL (ref 6–20)
CO2: 31 mmol/L (ref 22–32)
Calcium: 8.6 mg/dL — ABNORMAL LOW (ref 8.9–10.3)
Chloride: 97 mmol/L — ABNORMAL LOW (ref 98–111)
Creatinine, Ser: 0.73 mg/dL (ref 0.44–1.00)
GFR calc Af Amer: >60 mL/min (ref >60)
GFR calc non Af Amer: >60 mL/min (ref >60)
Glucose, Bld: 122 mg/dL — ABNORMAL HIGH (ref 70–99)
Potassium: 4.5 mmol/L (ref 3.5–5.1)
Sodium: 136 mmol/L (ref 135–145)
Total Bilirubin: 0.2 mg/dL — ABNORMAL LOW (ref 0.3–1.2)
Total Protein: 6.1 g/dL — ABNORMAL LOW (ref 6.5–8.1)

## 2018-07-23 LAB — CBC WITH DIFFERENTIAL/PLATELET
Abs Immature Granulocytes: 0.1 10*3/uL — ABNORMAL HIGH (ref 0.00–0.07)
Basophils Absolute: 0.1 10*3/uL (ref 0.0–0.1)
Basophils Relative: 1 %
Eosinophils Absolute: 0.2 10*3/uL (ref 0.0–0.5)
Eosinophils Relative: 2 %
HCT: 39.3 % (ref 36.0–46.0)
Hemoglobin: 11.9 g/dL — ABNORMAL LOW (ref 12.0–15.0)
Immature Granulocytes: 1 %
Lymphocytes Relative: 10 %
Lymphs Abs: 1.1 10*3/uL (ref 0.7–4.0)
MCH: 28.7 pg (ref 26.0–34.0)
MCHC: 30.3 g/dL (ref 30.0–36.0)
MCV: 94.7 fL (ref 80.0–100.0)
Monocytes Absolute: 0.8 10*3/uL (ref 0.1–1.0)
Monocytes Relative: 8 %
Neutro Abs: 8 10*3/uL — ABNORMAL HIGH (ref 1.7–7.7)
Neutrophils Relative %: 78 %
Platelets: 226 10*3/uL (ref 150–400)
RBC: 4.15 MIL/uL (ref 3.87–5.11)
RDW: 15.9 % — ABNORMAL HIGH (ref 11.5–15.5)
WBC: 10.2 10*3/uL (ref 4.0–10.5)
nRBC: 0 % (ref 0.0–0.2)

## 2018-07-23 LAB — APTT: aPTT: 27 seconds (ref 24–36)

## 2018-07-23 LAB — PROTIME-INR
INR: 1.1 (ref 0.8–1.2)
Prothrombin Time: 14.4 seconds (ref 11.4–15.2)

## 2018-07-23 MED ORDER — HEPARIN SOD (PORK) LOCK FLUSH 100 UNIT/ML IV SOLN
500.0000 [IU] | Freq: Once | INTRAVENOUS | Status: AC
  Administered 2018-07-23: 500 [IU]
  Filled 2018-07-23: qty 5

## 2018-07-23 MED ORDER — OXYMETAZOLINE HCL 0.05 % NA SOLN
2.0000 | Freq: Two times a day (BID) | NASAL | Status: DC | PRN
  Administered 2018-07-23: 14:00:00 2 via NASAL
  Filled 2018-07-23: qty 30

## 2018-07-23 NOTE — ED Provider Notes (Signed)
[facility] DEPT Provider Note   CSN: [id number] Arrival date & time: 07/23/18  1335    History   Chief Complaint Chief Complaint  Patient presents with  . Epistaxis    HPI Tasha Ballard is a 48 y.o. female.     Patient is a 48 year old female with history of diabetes, hypertension, pulmonary fibrosis.  Patient is oxygen dependent and has been on home oxygen for many years.  She presents today with complaints of nosebleed.  This began yesterday and has been continuing for nearly 24 hours.  She denies any specific injury or trauma.  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  The history is provided by the patient.  Epistaxis  Location:  Bilateral Severity:  Moderate Timing:  Constant Progression:  Unchanged Chronicity:  New Context: anticoagulants   Relieved by:  Nothing Worsened by:  Nothing Ineffective treatments:  Applying pressure   Past Medical History:  Diagnosis Date  . Aneurysm ([place])   . Back pain   . Diabetes mellitus without complication ([place])   . H/O blood clots   . Hypertension   . Joint pain   . Pulmonary fibrosis ([place])   . SOB (shortness of breath)   . Swelling   . Thyroid disease   . Vitamin D deficiency     Patient Active Problem List   Diagnosis Date Noted  . Rash 03/20/2018  . Leukopenia 03/20/2018  . ILD (interstitial lung disease) ([place]) 02/03/2018  . 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 On Cellcept therapy 04/19/2017  . Acute on chronic respiratory failure with hypoxia ([place]) 04/18/2017  . Pedal edema 12/30/2016  . 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 Sinus tachycardia 10/12/2016  . Thoracic lymphadenopathy 10/12/2016  . 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 Morbid obesity with BMI of 40.0-44.9, adult ([place]) 12/09/2015  . Oxygen dependent 05/26/2015  .  Morbid obesity ([place]) 04/02/2015  . Other specified interstitial pulmonary diseases ([place]) 10/30/2013  . 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 Long term current use of systemic steroids 10/22/2011  . Diverticulitis of intestine 10/22/2011  . Hypothyroidism 10/22/2011  . Pneumonia 10/22/2011  . Cough 10/01/2011  . Interstitial pulmonary disease ([place]) 09/10/2011    Past Surgical History:  Procedure Laterality Date  . ABLATION    . IR IMAGING GUIDED PORT INSERTION  01/26/2018  . KNEE SURGERY    . LUNG BIOPSY       OB History    Gravida  0   Para  0   Term  0   Preterm  0   AB  0   Living  0     SAB  0   TAB  0   Ectopic  0   Multiple  0   Live Births  0            Home Medications    Prior to Admission medications   Medication Sig Start Date End Date Taking? Authorizing Provider  amoxicillin-clavulanate (AUGMENTIN) 875-125 MG tablet Take 1 tablet by mouth 2 (two) times daily. 06/09/18   Martyn Ehrich, NP  apixaban (ELIQUIS) 5 MG TABS tablet Take 5 mg by mouth 2 (two) times daily.    [provider]  benzonatate (TESSALON) 100 MG capsule Take 100 mg by mouth 3 (three) times daily as needed for cough.     [provider]  carvedilol (COREG) 6.25 MG tablet Take 6.25 mg by mouth 2 (two) times daily with a meal.    [provider]  cholecalciferol (VITAMIN D) 25 MCG (1000 UT) tablet Take 1,000 Units by mouth daily.    [provider]  clindamycin (CLEOCIN T) 1 % lotion  04/18/18   [provider]  cyclophosphamide (CYTOXAN) 25 MG capsule Give on an empty stomach 1 hour before or 2 hours after meals. Take 2 capsule by mouth daily for 7 days; then take 3 capsules by mouth for 7 days then start taking 100 mgs daily by mouth. 05/25/18   Brand Males, MD  cyclophosphamide (CYTOXAN) 50 MG capsule Take 2 capsules (100 mg total) by mouth daily. Give on an empty stomach 1 hour before or 2 hours after meals.  05/25/18   Brand Males, MD  furosemide (LASIX) 40 MG tablet Take 40 mg by mouth See admin instructions. Take 1 tablet (40 mg) daily, may repeat dose if needed for fluid retention.    [provider]  hydrocortisone ointment 0.5 % Apply 1 application topically 2 (two) times daily. Patient not taking: Reported on 04/26/2018 03/20/18   Lauraine Rinne, NP  metFORMIN (GLUCOPHAGE) 500 MG tablet Take 500 mg by mouth 2 (two) times daily.     [provider]  montelukast (SINGULAIR) 10 MG tablet Take 10 mg by mouth daily after supper.    [provider]  OFEV 150 MG CAPS Take 150 mg by mouth 2 (two) times daily. 01/23/18   [provider]  ondansetron (ZOFRAN) 8 MG tablet TAKE 1 TABLET BY MOUTH EVERY 8 HOURS AS NEEDED FOR NAUSEA/VOMITING 07/11/18   Parrett, [name] S, NP  potassium chloride (K-DUR) 10 MEQ tablet Take 2 tablets (20 mEq total) by mouth 2 (two) times daily. 04/18/18   Parrett, Fonnie Mu, NP  predniSONE (DELTASONE) 10 MG tablet Take 10-68m as needed for SOB Patient taking differently: Take 10 mg by mouth daily with breakfast.  11/22/17   RBrand Males MD  PROAIR HFA 108 ([phone]) MCG/ACT inhaler Inhale 2 puffs into the lungs every 6 (six) hours as needed for wheezing or shortness of breath. 01/10/18   [provider]  spironolactone (ALDACTONE) 100 MG tablet Take 100 mg by mouth daily.    [provider]  sulfamethoxazole-trimethoprim (BACTRIM) 400-80 MG tablet Once a day while on prednisone Patient taking differently: Take 1 tablet by mouth every evening.  11/22/17   RBrand Males MD  thyroid (ARMOUR) 90 MG tablet Take 90 mg by mouth daily before breakfast.     [provider]  Vitamin D, Ergocalciferol, (DRISDOL) 1.25 MG (50000 UT) CAPS capsule TAKE 1 CAPSULE BY MOUTH ONCE WEEKLY 04/24/18   BDennard NipD, MD    Family History Family History  Problem Relation Age of Onset  . Heart failure Mother   . Heart disease Mother    . Rheumatologic disease Father     Social History Social History   Tobacco Use  . Smoking status: Never Smoker  . Smokeless tobacco: Never Used  Substance Use Topics  . Alcohol use: Not Currently  . Drug use: Never     Allergies   Macrobid [nitrofurantoin macrocrystal] and Adhesive [tape]   Review of Systems Review of Systems  HENT: Positive for nosebleeds.   All other systems reviewed and are negative.    Physical Exam Updated Vital Signs BP (!) 148/103 (BP Location: Right Arm)   Pulse (!) 118  Resp (!) 25   Ht _0  (1.575 m)   Wt 102.1 kg   SpO2 93%   BMI 41.15 kg/m   Physical Exam Vitals signs and nursing note reviewed.  Constitutional:      General: She is not in acute distress.    Appearance: She is well-developed. She is not diaphoretic.  HENT:     Head: Normocephalic and atraumatic.     Nose: No congestion.     Comments: There is a slight but steady trickle of blood from both nares.  There is no obvious visible source of the bleeding.  She does have a large missing section of septum. Neck:     Musculoskeletal: Normal range of motion and neck supple.  Cardiovascular:     Rate and Rhythm: Normal rate and regular rhythm.     Heart sounds: No murmur. No friction rub. No gallop.   Pulmonary:     Effort: Pulmonary effort is normal. No respiratory distress.     Breath sounds: Normal breath sounds. No wheezing.  Abdominal:     General: Bowel sounds are normal. There is no distension.     Palpations: Abdomen is soft.     Tenderness: There is no abdominal tenderness.  Musculoskeletal: Normal range of motion.  Skin:    General: Skin is warm and dry.  Neurological:     Mental Status: She is alert and oriented to person, place, and time.      ED Treatments / Results  Labs (all labs ordered are listed, but only abnormal results are displayed) Labs Reviewed  COMPREHENSIVE METABOLIC PANEL  CBC WITH DIFFERENTIAL/PLATELET  PROTIME-INR  APTT    EKG  None  Radiology No results found.  Procedures Procedures (including critical care time)  Medications Ordered in ED Medications  oxymetazoline (AFRIN) 0.05 % nasal spray 2 spray (2 sprays Each Nare Given by Other 07/23/18 1422)     Initial Impression / Assessment and Plan / ED Course  I have reviewed the triage vital signs and the nursing notes.  Pertinent labs & imaging results that were available during my care of the patient were reviewed by me and considered in my medical decision making (see chart for details).  Patient presents with a nosebleed while on Eliquis.  He had bleeding from both nares and has a large septal defect.  Both nares were packed with Rhino Rocket's with good hemostasis.  Patient appears appropriate for discharge.  I spoke with Dr. Constance Holster regarding this patient.  She is to be followed up this week in the ENT clinic.  Final Clinical Impressions(s) / ED Diagnoses   Final diagnoses:  None    ED Discharge Orders    None       Veryl Speak, MD 07/23/18 1551

## 2018-07-23 NOTE — ED Triage Notes (Signed)
Patient has been having a nosebleed since 2200 last night. Patient  Is currently taking oral chemo. Patient states she has been using tampons to control bleeding at home because she did not want to come to the Ed. Patient is on Eliquis.

## 2018-07-23 NOTE — Discharge Instructions (Signed)
Leave packing in place until seen by ENT.  Call the ENT clinic on Monday to arrange a follow up appointment for packing removal.  Dr. Constance Holster is aware of your situation and wants you followed up this week.  Return to the emergency department if symptoms significantly worsen or change.

## 2018-07-23 NOTE — Telephone Encounter (Signed)
Followed by [name] for ILD on cytoxan, ofev.    She is on eliquis, but not clear why from review of chart.  Last dose was in PM of 5/30.  She gets frequent nose bleeds.  Developed nose bleed on 5/30 that has persisted.  She has tried putting tampon and cotton balls in her nose, and tried applying ice and pressure.  Bleeding persists.  She is starting to feel weak.  Her husband is going to get a blood pressure cuff.  Advised her that she needs to go to the ER for further assessment since outpatient therapies for epistaxis have not worked.

## 2018-07-24 ENCOUNTER — Encounter (HOSPITAL_COMMUNITY): Payer: BLUE CROSS/BLUE SHIELD

## 2018-07-24 ENCOUNTER — Ambulatory Visit (HOSPITAL_COMMUNITY): Payer: BLUE CROSS/BLUE SHIELD

## 2018-07-27 MED FILL — CYCLOPHOSPHAMIDE 50 MG CAPS: 50 | 30 days supply | Qty: 60 | Fill #1

## 2018-07-28 DIAGNOSIS — Z789 Other specified health status: Secondary | ICD-10-CM | POA: Insufficient documentation

## 2018-08-07 ENCOUNTER — Telehealth: Payer: Self-pay | Admitting: Internal Medicine

## 2018-08-07 NOTE — Telephone Encounter (Signed)
Spoke with MR. Per MR, try to get pt scheduled for a virtual visit tomorrow, 6/16 with him.  Attempted to call pt but unable to reach. Left message on both home and mobile numbers stating that we were trying to get her added for a virtual visit with MR tomorrow. Left message for pt to return call.

## 2018-08-07 NOTE — Telephone Encounter (Signed)
Pt returned call. Pt has been scheduled for virtual visit with MR tomorrow, 6/16 at 2:30. Nothing further needed.

## 2018-08-08 ENCOUNTER — Other Ambulatory Visit: Payer: Self-pay | Admitting: General Surgery

## 2018-08-08 ENCOUNTER — Encounter: Payer: Self-pay | Admitting: Internal Medicine

## 2018-08-08 ENCOUNTER — Telehealth (INDEPENDENT_AMBULATORY_CARE_PROVIDER_SITE_OTHER): Payer: BC Managed Care – PPO | Admitting: Internal Medicine

## 2018-08-08 DIAGNOSIS — J849 Interstitial pulmonary disease, unspecified: Secondary | ICD-10-CM | POA: Diagnosis not present

## 2018-08-08 DIAGNOSIS — R040 Epistaxis: Secondary | ICD-10-CM

## 2018-08-08 DIAGNOSIS — J9611 Chronic respiratory failure with hypoxia: Secondary | ICD-10-CM

## 2018-08-08 DIAGNOSIS — Z79899 Other long term (current) drug therapy: Secondary | ICD-10-CM | POA: Diagnosis not present

## 2018-08-08 NOTE — Progress Notes (Signed)
cbs

## 2018-08-08 NOTE — Patient Instructions (Addendum)
ICD-10-CM   1. Chronic respiratory failure with hypoxia (HCC)  J96.11   2. ILD (interstitial lung disease) ([place])  J84.9   3. Encounter for long-term current use of high risk medication  Z79.899   4. Epistaxis  R04.0   Overall stable  Continue with nintedanib and oral Cytoxan I am not changing you to IV Cytoxan till after reassessment in mid July 2020 Continue oxygen as before and we will monitor symptoms Do lab work for her risk medication use monitoring early July 2020  Regarding epistaxis: Stay off Eliquis.  I have written to Dr. Tamera [facility] about whether you anticoagulation should be indefinite or if it is okay to stop especially given current Cytoxan and epistaxis.  Follow-up -Early July 2020 for lab work -Mid July 2020 for video visit -during this visit we will reassess when to do a face-to-face visit and when/if t to switch you to IV Cytoxan

## 2018-08-08 NOTE — Progress Notes (Signed)
-Lung function November 08, 2017: FVC 0.9 L / 27%, DLCO 5.86/29%  - HRCT 11/08/17 Rt diaphragm elevation ? Eventration v paresis.  Esophagogram November 11, 2017 - > 11/11/17 Tasha Ballard - Normal  Dr. Threasa Alpha at [facility].She was started on  IOV 11/22/2017  Chief Complaint  Patient presents with  . Consult    consut due to pulmonary fibrosis. Pt has been followed by [facility] and last PFT was peformed 8/14 at [facility]. Pt does have complaints of SOB with exertion which she states has become better due to rehab. Pt also has a dry hacking cough with mild production of mucus. Denies any CP.    Is seen by presents to the ILD clinic.  This is her first visit with [name].  History is obtained from her and review of the [place] where she is to follow-up with Dr. Hortencia Pilar.  Interstitial lung disease clinic integrated interstitial lung disease questionnaire: Symptoms: Shortness of breath for years.  Gradually worse.  Currently level 5 shortness of breath walking up stairs or walking up a hill with oxygen.  Level for shortness of breath for sweeping or vacuuming and level 3 shortness of breath for shopping or picking up things and doing laundry.  Level 3 shortness of breath or standing up from a chair or taking a shower making the bed and level 1 dyspnea for brushing teeth.  She also has a cough for 6 years which is getting worse.  On and off that is phlegm.  Occasional wheezing present there is associated fatigue.  She tells me that she had undiagnosed and unrecognized ILD for years.  This was then picked up at [facility] and then she got referred to [facility].  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  At this time she was placed on CellCept.  Then in 2016 she got somewhat worse and was placed on 2 L nasal cannula continuously.  In 2017 she tells me that she had a aortic aneurysm and this was  repaired at [facility].  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  And then for the last year and a half or so she has been on significantly worsening hypoxemia.  In the spring 2019 she is requiring 8 L at rest and 15 L at exertion.  Apparently Dr. Lauris Chroman then recommended Rituxan infusions which she got to 60 days apart with the last one being approximately in April of May 2019.  After that she is somewhat improved requiring only 9-10 L with exertion.  She has been attending pulmonary rehabilitation which has been helping.  She is obese and unable to lose weight although she is lost some.  She believes a lot of her oxygen needs might be related to deviated nasal septum/septal perforation.  She subsequently followed up with Dr. Ruthann Cancer at [facility] in the summer 2019.  I reviewed the notes.  Palliative care was recommended because of progressive respiratory failure.  She has never seen the transplant clinic at [facility] but she recommends that because of obesity and diaphragmatic dysfunction that she would not be a good candidate for transplant .  She was then frustrated with experience because she wants to fight the disease.  She then drove to [facility] and saw Dr. Elisabeth Cara at [facility] 1 week ago.  She spent substantial number of days there.  At this point in time she showed me an email from him that suggest he might have myositis-ILD but they would like to review her CT scans and pathology from [facility].  Apparently she had significant amount of blood work and other tests at [facility]  I do not have access to these records at this point.  She believes that she wants to undergo transplant.  She thinks that she would be able to have transplant in [place] despite a high BMI.  Currently in [place] she is somewhat stable attending pulmonary rehabilitation.  She is interested in pulmonary trials  Past  medical history  -Tested for sleep apnea at [facility] and was told to be negative.  But at [facility] in September 2019 they recommended a repeat sleep study -Denied all forms of connective tissue disease diagnosis and vasculitis.  Although at [facility] September 2019 when she was seen last week she was told she might have myositis-ILD but this is pending confirmation in the multidisciplinary case conference -She does report positive for diabetes and thyroid disease and mononucleosis in the past but denies any tuberculosis or kidney disease   Review of systems -Positive for fatigue for a few years, arthralgia for a few years but denies dysphagia.  Positive for dry eyes and mouth for few years.  No Raynaud.  No recurrent fever.  No weight loss.  No acid reflux but does admit to snoring for a few years.  No rash no ulcers.  Family history of lung disease COPD and a long but no pulmonary fibrosis.  Dad did have rheumatoid arthritis  Personal exposure history Denies tobacco, vaping, marijuana, cocaine, IV drugs  Home and hobby history  [place] home which is 50 years old  Occupational history -She does stay in a condition spaces.  122 point occupational questionnaire completely negative  Medication history -She has a history of allergy to Macrobid/nitrofurantoin.  Do not know when she took it.  She is currently on immunosuppressive regimen of CellCept, Bactrim and prednisone.  CellCept since 2013 and prednisone since 2012.  She did get Rituxan x2 doses with the last one being in spring 2019.  She feels her hypoxemia improved somewhat after that.     has a past medical history of Aneurysm ([place]), Diabetes mellitus without complication ([place]), and Thyroid disease.   reports that she has never smoked. She has never used smokeless tobacco.   OV 12/21/2017  Subjective:  Patient ID: Tasha Ballard, female , DOB: 11-21-1970 , age 65 y.o. , MRN: [id number] ,  ADDRESS: [address] [place] [address]   12/21/2017 -   Chief Complaint  Patient presents with  . Follow-up    ILD     HPI Tasha Ballard 48 y.o. -returns for follow-up.  She brings her mother along who I am meeting for the first time.  This visit is specifically focused on evaluation from [place].  Yesterday I did speak to the interstitial lung disease specialist at [facility] Dr. Threasa Alpha.  He told me he was finally able to receive and review the surgical lung biopsy from [facility].  He and his pathologist agreed with the diagnosis of progressive NSIP.  His main recommendation was to switch patient to Cytoxan.  In addition also aggressively pursue weight loss even if it means for bariatric surgery approach so we can get ready for transplant.  He agreed the prognosis is very poor.  At this point  in time patient compared to the last visit is stable.  She is using more than 10 L of oxygen up to 15 L of oxygen.  She is interested in Cytoxan.  Dr. Elisabeth Cara indicated to me that efficacy wise oral and IV are similar but he felt IV would be a little bit safer from a urologic standpoint.  Patient is interested in weight loss.  She says she has had difficulty losing weight despite being hypocaloric.  She is interested in nutritional referral.  In addition she wants a sleep specialist referral.  She says [place] OSA was ruled out.  She has significant insomnia and daytime tiredness.  She said that [place] recommended sleep referral ASAP.  She is registered to participate in the ILD-pro registry study for progressive non-IPF disease.  This was done today.   Investigations at [facility] -November 09, 2017: Echocardiogram ejection fraction 70-75% with grade 2 diastolic dysfunction.  Right ventricle was mildly enlarged but global RV function was felt to be normal.  No pericardial effusion no obvious right-to-left shunt at rest or cough or Valsalva on agitated  saline contrast exam and stage I right ventricular diastolic dysfunction.  Pulmonary artery systolic pressure at 45 mmHg  -Lung function November 08, 2017: FVC 0.9 L / 27%, DLCO 5.86/29%  - HRCT 11/08/17 Rt diaphragm elevation ? Eventration v paresis. Sniff test recommendd  Esophagogram November 11, 2017 - > 11/11/17 Natil Jewsh - Normal    12/22/2017-telephone encounter - Dr. [name] >>>starting Cytoxan 50 mg/day >>>Avoid lots of fluids to avoid cystitis, avoid intercourse without contraception, return in 7 to 10 days to recheck CBC, Bmet, LFTs, UA  12/22/17-office visit-[place] >>>Plan in lab sleep study be ordered, patient will likely need BiPAP with oxygen  12/30/2017  - Visit   48 year old female patient presenting today for one-week follow-up visit after starting Cytoxan.  Patient also get lab work completed today.  Unfortunately patient did not get this completed prior to her office visit she will get afterwards.  Patient reports baseline symptoms of nonproductive cough.  Requiring 4 to 6 L of oxygen at rest as well as 10 to 15 L of oxygen with exertion.  Patient does have her baseline shortness of breath with exertion.  Patient reporting no worsening symptoms since starting Cytoxan.   Patient reports she typically is on 9 to 10 L of oxygen at home.   OV 01/17/2018  Subjective:  Patient ID: Tasha Ballard, female , DOB: Jul 07, 1970 , age 54 y.o. , MRN: [id number] , ADDRESS: [address] [place] [address]   01/17/2018 -   Chief Complaint  Patient presents with  . Follow-up    2 week f/u, lung transplant, was contacted and waiting on weight loss to continue   PRogressive NSIP Chronic hypoxemic resp failure Morbid Obesity - needs weight loss for transplant Rx regimen   -  cytoxan 54m per day 12/23/17 and increaed to 1052mon 12/23/17.On11/19/19. increase to 12549mer day  And switched to IV cytoxan Q4 week cycle - 1st cycle 02/03/18.   HPI Tasha Ballard 38.o. -presents for follow-up with her husband.  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 on cytoxan 82m2mr day 12/23/17 and increaed to 100mg33m11/1/19.  On 01/10/18.  increase to 125mg 10mday .  Plan is to get blood work today.  She wants a  Port-A-Cath placed.  She prefers IV Cytoxan.  I checked with Dr. Threasa Alpha and he feels either p.o. or IV Cytoxan is fine but in the balance IV Cytoxan has a slightly lower incidence of hemorrhagic cystitis and the patient prefers this.  At this point in time she tells me that the Cytoxan seems to be working well for her.  She is less hypoxemic she is able to get by with 5 L at rest.  On the other hand this could be because of weight loss.  However there is some fatigue.  She will have blood work and urine analysis to look for cytotoxicity from Cytoxan.  Based on the newINBUILD  data we discussed about starting nintedanib.  I checked with Dr. Threasa Alpha at [facility] and he is supportive of the plan.  Patient states that she has done extensive research on nintedanib and she is very excited about the possibility of taking this.   Obesity: Weight 231#  11/22/17  -> 221# 01/17/2018.,  She saw the nutritionist at [facility].  She is using an app and tracking her weight on a regular basis.  She is encouraged by the weight loss.  We gave her a low glycemic diet sheet.  She has not been able to see the bariatric surgeon at [facility] surgery but I did run into Dr. Kaylyn Lim in the hallway and he said he would be happy to see her and counsel her.  Although he did acknowledge she would be high risk for surgery.  He is in favor of low carbohydrate diet.  A few weeks ago I met the transplant physician from [facility] and he brought up the idea about losing weight loss drugs.  His preferred drug is phentermine.  We discussed the  possibility about getting an endocrine consult  Lung transplant: [facility] has called her.  They want to BMI less than 30 or 36 I am not sure.  She is working towards his goal.  She has not heard from [facility].  I emailed them and the day after the visit patient is emailed me saying that she has heard from them.  She is very keen to get on the transplant list.    02/03/2018 OV for Direct Admit for IV Cytoxan  Patient presents to the office today for direct admission to [facility] for IV Cytoxan infusion.  She says her breathing is at baseline.  She remains on oxygen 4 to 6 L at rest and 10 to 15 L with activity.  He says her leg swelling is at baseline.  She remains on Lasix 40 mg daily and Aldactone 100 mg.  She is on chronic steroids with prednisone 10 mg daily. She has been on 05 x1 week.  Says she is tolerating with no nausea vomiting or diarrhea.  Does have some mild constipation.  Patient says she had a Port-A-Cath placed 1 week ago. Patient has chronic shortness of breath gets winded with minimal activity.  Has intermittent dry cough.   02/13/2018 Follow up : Progressive NSIP , O2 RF  Patient returns for a one-week follow-up.  Patient was recently admitted for IV Cytoxan under observation status.. Patient had been on oral Cytoxan.  Continue to have progressive worsening of her ILD.  At baseline she is on 4 to 6 L at rest.  And 10 to 15 L with activity.  She is on Aldactone and Lasix.  She has been started on OFEV 3 weeks  ago.  She is tolerating well with no nausea vomiting or diarrhea.  Does have some mild constipation. Since her Cytoxan infusion.  She says she has done well.  She had minimal nausea.  She does feel that her breathing has slightly improved.  Her oxygen demands are decreased.  She is using mainly only 6 L of oxygen.  And doing well with this with good O2 saturations. Says she was able to actually go out to dinner for the first time in a long  time.  She denies any hematuria, chest pain, orthopnea or increased leg swelling.  She is working on weight loss.  She is going to the weight loss center. Labs were done on 12/20 that showed normal Agostino blood cell count, platelets and kidney function. Urinalysis was done today.  And is pending.. Plans are for a Cytoxan infusion in 3- 4 weeks from her last infusion.  OV 03/02/2018  Subjective:  Patient ID: Tasha Ballard, female , DOB: 06/25/1970 , age 34 y.o. , MRN: [id number] , ADDRESS: [address] [place] [address]   03/02/2018 -   Chief Complaint  Patient presents with  . Follow-up    Appt prior to pt's next Cytoxan infusion.  Pt states she is doing well since last visit. States she can tell that her breathing has improved. Has had some nausea/vomiting and wonders if it is from the [place].   #Morbid Obesity - needs weight loss for transplant - sees Dr Janeal Holmes since dec 2019 #PRogressive NSIP #Chronic hypoxemic resp failure - 2L at rest, 8L with exertion # High risk treatment regimen  -CellCept 2013 through 2019  -Chronic prednisone 2013 -currently taking -Bactrim prophylaxis 2013 --currently taking  - Cytoxan 43m per day 12/23/17 and increaed to 1058mon 12/23/17.On11/19/19. increase to 12530mer day  And switched to IV cytoxan Q4 week cycle - 1st cycle 02/03/18 (s/p portocath 01/26/18)  - Ofev 01/23/18 (approximnate)   HPI ChrKARNISHA LEFEBRE 57o. -presents for the above issues.  I personally saw her before Thanksgiving 2019.  After that she is seen by nurse practitioner under my supervision as documented above for Cytoxan infusion.  She has had post Cytoxan infusion labs which showed her Bal[place]ine count is decreasing but still adequate.  Most recent labs was February 13, 2018 at the weight loss clinic with a Colleran count of 5900.  Rest of the labs reviewed and appear adequate and normal.  Including liver function test.  She is developed new onset of intermittent nausea and  vomiting ever since starting nintedanib early December 2019.  This particularly happens with the Christmas meals.  She says this appears at random and is mild and is treated with Zofran.  Given the beneficial effects of nintedanib and progressive non-IPF ILD she is agreed to continue to take this.  She continues on her chronic prednisone and Bactrim prophylaxis.  She is finished 1 cycle of IV Cytoxan February 03, 2018.  She has upcoming second cycle 4 weeks apart on March 06, 2018.  At this point in time she is feeling well.  She feels her ILD has actually improved.  On 2 L at rest she was saturating at 98%.  This is a huge improvement for her.  However when we walked her she still needed 8 L of oxygen with exertion.  She still says this is an improvement because in the past she was using more than 10 L for exertion.  Today my nurse practitioner will enter  the orders for upcoming infusion.  There is no fever.  Morbid obesity: She has seen Dr. Leafy Ro at the weight loss clinic.  I reviewed the notes.  Dietary measures are indicated.  I have written to Dr. Leafy Ro asking about medications to accelerate weight loss given the fact her lung disease is poor prognosis and she is an urgent need for lung transplant and with weight loss being her main barrier.   Obesity: Weight 231#  11/22/17  -> 221# 01/17/2018., -> 222# on 03/02/2018    OV 03/30/2018  Subjective:  Patient ID: Tasha Ballard, female , DOB: 28-Feb-1970 , age 71 y.o. , MRN: [id number] , ADDRESS: [place] [address]   03/30/2018 -   Chief Complaint  Patient presents with  . Follow-up    PFT peformed today.  Pt states she has been doing okay since last visit.     #Morbid Obesity - needs weight loss for transplant - sees Dr Janeal Holmes since dec 2019 #PRogressive NSIP #Chronic hypoxemic resp failure - 2L at rest, 8L with exertion # High risk treatment regimen  -CellCept 2013 through 2019  -Chronic prednisone 2013 -currently  taking -Bactrim prophylaxis 2013 --currently taking  - Cytoxan Oral  45m per day 12/23/17 and increaed to 104mon 12/23/17.On11/19/19. increase to 12579mer day   - Cytoxan IV : And switched to IV cytoxan Q4 week cycle   - 1st cycle 02/03/18 (s/p portocath 01/26/18)  = 2nd cycle 03/06/2018  - 3rd cycle  -pending 04/03/2018  - Ofev 01/23/18 (approximnate)   HPI Tasha Ballard 53o. -presents for follow-up of the above issues.  Her husband is here with her.  In terms of - Obesity: Weight 231#  11/22/17  -> 221# 01/17/2018., -> 222# on 03/02/2018 -> 220# on 03/30/2018  In terms of her progressive NSIP and chronic hypoxemic respiratory failure: She has finished 2 cycles of Cytoxan.  Second cycle was a full dose.  After that she got neutropenic as reviewed on the labs.  During this time she developed a facial rash.  This was on a weekend and over the phone we treated her with doxycycline.  It is largely improved.  Tomorrow she has an appointment with her dermatologist.  I do not know who it is.  Overall she is feeling stable in terms of her IPF.  [facility].  In fact [place] to [place] is even better.  She attributes this to both nintedanib and Cytoxan.  She is using 4 L of oxygen at rest and 8 L with exertion.  Her symptom score is documented below with shortness of breath at 21-22.  She always has a lot of fatigue.  In terms of her medicine tolerance particularly with nintedanib she had intermittent nausea and vomiting particularly after eating certain types of foods.  She is managing this with Zofran.  Symptom score is documented below.  Her most recent cycle of Cytoxan was March 06, 2018.  Her next Cytoxan is April 03, 2018.  I discussed with Dr. MohJulien Nordmanncologist was a lot of experience with Cytoxan.  He said is okay to go with full dose if I felt so.  Patient herself is very keen on getting a full dose of Cytoxan.   She did not have neutropenic fever although she got neutropenic with the second cycle.   SYMPTOM SCALE - ILD 03/30/2018   O2 use  4 L at  rest, 8 L with exertion  Shortness of Breath 0 -> 5 scale with 5 being worst (score 6 If unable to do)  At rest 0  Simple tasks - showers, clothes change, eating, shaving 1  Household (dishes, doing bed, laundry) 2.5  Shopping 3  Walking level at own pace 3  Walking keeping up with others of same age 80  Walking up Stairs 4  Walking up [place] 4  Total (40 - 48) Dyspnea Score 21.5  How bad is your cough? x  How bad is your fatigue 5 ("very, lol"          Results for Fauteux, DANETRA GLOCK" (MRN [id number]) as of 03/30/2018 13:38  Ref. Range 10/05/2017 [facility] 11/08/2017 [facility] 03/30/2018 11:50  FVC-Pre Latest Units: L 0.96 FVC 0.9 L,   1.09  FVC-%Pred-Pre Latest Units: % 31 27% 32  Results for Lalli, KYRSTAL MONTERROSA" (MRN [id number]) as of 03/30/2018 13:38  Ref. Range 10/05/2017 9/17/20189 03/30/2018 11:50  DLCO unc Latest Units: ml/min/mmHg 5.47  5.86 6.85  DLCO unc % pred Latest Units: % 28 29% 34   ROS - per HPI    .Video visit 08/08/2018   #Morbid Obesity - needs weight loss for transplant - sees Dr Janeal Holmes since dec 2019 #PRogressive NSIP #Chronic hypoxemic resp failure - 2L at rest, 8L with exertion # High risk treatment regimen  -CellCept 2013 through 2019  -Chronic prednisone 2013 -currently taking -Bactrim prophylaxis 2013 --currently taking  - Cytoxan Oral  34m per day 12/23/17 and increaed to 1072mon 12/23/17.On11/19/19. increase to 12562mer day   - Cytoxan IV : And switched to IV cytoxan Q4 week cycle   - 1st cycle 02/03/18 (s/p portocath 01/26/18)  = 2nd cycle 03/06/2018  - 3rd cycle  -pending 04/03/2018  - Oral cyrtoxan due to covid pandemic - since early April 2020  - Ofev 01/23/18 (approximnate)  Virtual Visit via Video Note  I connected with Tasha Ballard 08/08/18 at  2:30 PM EDT by a video enabled  telemedicine application and verified that I am speaking with the correct person using two identifiers.  Location: Patient:  ChrROSEBUD KOENENrovider: Dr MurBrand MalesI discussed the limitations of evaluation and management by telemedicine and the availability of in person appointments. The patient expressed understanding and agreed to proceed.  History of Present Illness:  She has interstitial lung disease that is of progressive variety requiring immunosuppression and also nintedanib.  Last seen before the onset of the pandemic.  After that we had to stop IV Cytoxan and change her to oral Cytoxan to promote social distancing.  Since then this is the first ILD clinic visit.  This is being done virtual.  In the interim towards the end of May 2020 she ended up with epistaxis that was significant ended up in the ER.  They could not perform COVID testing because of a significant epistaxis.  She had other labs that I reviewed and these are normal.  Since then she has stopped her Eliquis.  Of note she tells me that she was started on Eliquis approximately 3 years ago by Dr. ChaTamera Puntng which [facility] is for   "status post embolization of GDA aneurysm. 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  In terms of interstitial lung disease she is stable.  She is using between 7 and 8 L of oxygen.  In terms of her transplant eligibility she has not lost any weight.  In terms of her drug safety from Cytoxan and nintedanib: There are no new side effects.  Her lab review from Jul 23, 2018 is normal.  She has not had any further pulmonary function testing since January 2020 because of the pandemic but using the surrogate marker of dyspnea and oxygen use she is  stable. Observations/Objective: She looks the same and is on oxygen  Assessment and Plan:   ICD-10-CM   1. Chronic respiratory failure with hypoxia (HCC)  J96.11   2. ILD (interstitial lung disease) ([place])  J84.9   3. Encounter for long-term current use of high risk medication  Z79.899   4. Epistaxis  R04.0      Follow Up Instructions:  Overall stable  Continue with nintedanib and oral Cytoxan I am not changing you to IV Cytoxan till after reassessment in mid July 2020 Continue oxygen as before and we will monitor symptoms Do lab work for her risk medication use monitoring early July 2020  Regarding epistaxis: Stay off Eliquis.  I have written to Dr. Tamera Punt long [facility] about whether you anticoagulation should be indefinite or if it is okay to stop especially given current Cytoxan and epistaxis.  Follow-up -Early July 2020 for lab work -Mid July 2020 for video visit -during this visit we will reassess when to do a face-to-face visit and when/if t to switch you to IV Cytoxan    I discussed the assessment and treatment plan with the patient. The patient was provided an opportunity to ask questions and all were answered. The patient agreed with the plan and demonstrated an understanding of the instructions.   The patient was advised to call back or seek an in-person evaluation if the symptoms worsen or if the condition fails to improve as anticipated.  > 50% of this > 25 min visit spent in face to face counseling or coordination of care - by this undersigned MD - Dr Brand Males. This includes one or more of the following documented above: discussion of test results, diagnostic or treatment recommendations, prognosis, risks and benefits of management options, instructions, education, compliance or risk-factor reduction    SIGNATURE    Dr. Brand Males, M.D., F.C.C.P,  Pulmonary and Critical Care Medicine Staff Physician, [place] Director -  Interstitial Lung Disease  Program  Pulmonary [place] [facility] [place], [place], [address]  Pager: [phone], If no answer or between  15:00h - 7:00h: call [phone] Telephone: [phone]  3:23 PM 08/08/2018

## 2018-08-23 MED FILL — CYCLOPHOSPHAMIDE 50 MG CAPS: 50 | 30 days supply | Qty: 60 | Fill #2

## 2018-09-13 ENCOUNTER — Telehealth: Payer: Self-pay | Admitting: Internal Medicine

## 2018-09-13 ENCOUNTER — Other Ambulatory Visit (INDEPENDENT_AMBULATORY_CARE_PROVIDER_SITE_OTHER): Payer: BC Managed Care – PPO

## 2018-09-13 DIAGNOSIS — J9611 Chronic respiratory failure with hypoxia: Secondary | ICD-10-CM | POA: Diagnosis not present

## 2018-09-13 LAB — CBC WITH DIFFERENTIAL/PLATELET
Basophils Absolute: 0 10*3/uL (ref 0.0–0.1)
Basophils Relative: 0.5 % (ref 0.0–3.0)
Eosinophils Absolute: 0.1 10*3/uL (ref 0.0–0.7)
Eosinophils Relative: 1 % (ref 0.0–5.0)
HCT: 39.1 % (ref 36.0–46.0)
Hemoglobin: 12.8 g/dL (ref 12.0–15.0)
Lymphocytes Relative: 3.4 % — ABNORMAL LOW (ref 12.0–46.0)
Lymphs Abs: 0.3 10*3/uL — ABNORMAL LOW (ref 0.7–4.0)
MCHC: 32.7 g/dL (ref 30.0–36.0)
MCV: 95.7 fl (ref 78.0–100.0)
Monocytes Absolute: 0.6 10*3/uL (ref 0.1–1.0)
Monocytes Relative: 7.8 % (ref 3.0–12.0)
Neutro Abs: 6.6 10*3/uL (ref 1.4–7.7)
Neutrophils Relative %: 87.3 % — ABNORMAL HIGH (ref 43.0–77.0)
Platelets: 192 10*3/uL (ref 150.0–400.0)
RBC: 4.08 Mil/uL (ref 3.87–5.11)
RDW: 16.1 % — ABNORMAL HIGH (ref 11.5–15.5)
WBC: 7.6 10*3/uL (ref 4.0–10.5)

## 2018-09-13 LAB — URINALYSIS, ROUTINE W REFLEX MICROSCOPIC
Bilirubin Urine: NEGATIVE
Hgb urine dipstick: NEGATIVE
Ketones, ur: NEGATIVE
Leukocytes,Ua: NEGATIVE
Nitrite: NEGATIVE
RBC / HPF: NONE SEEN (ref 0–?)
Specific Gravity, Urine: 1.015 (ref 1.000–1.030)
Total Protein, Urine: NEGATIVE
Urine Glucose: NEGATIVE
Urobilinogen, UA: 0.2 (ref 0.0–1.0)
WBC, UA: NONE SEEN (ref 0–?)
pH: 7.5 (ref 5.0–8.0)

## 2018-09-13 LAB — COMPREHENSIVE METABOLIC PANEL
ALT: 24 U/L (ref 0–35)
AST: 19 U/L (ref 0–37)
Albumin: 4 g/dL (ref 3.5–5.2)
Alkaline Phosphatase: 62 U/L (ref 39–117)
BUN: 12 mg/dL (ref 6–23)
CO2: 32 mEq/L (ref 19–32)
Calcium: 9.9 mg/dL (ref 8.4–10.5)
Chloride: 92 mEq/L — ABNORMAL LOW (ref 96–112)
Creatinine, Ser: 0.82 mg/dL (ref 0.40–1.20)
GFR: 73.78 mL/min (ref >60.00)
Glucose, Bld: 133 mg/dL — ABNORMAL HIGH (ref 70–99)
Potassium: 4.2 mEq/L (ref 3.5–5.1)
Sodium: 135 mEq/L (ref 135–145)
Total Bilirubin: 0.5 mg/dL (ref 0.2–1.2)
Total Protein: 6.5 g/dL (ref 6.0–8.3)

## 2018-09-13 NOTE — Telephone Encounter (Signed)
Spoke with pt when she came to office to have labwork done. Pt stated she has had backpain for about 3 weeks now and is unsure if this could be coming from the [facility] or if it could be due to something else. Pt also was requesting to have Rx tessalon called into pharmacy for her. She stated this was originally prescribed by MD at [facility] but she wanted to know if we could take over filling this for her. Pt also had other questions. I stated to pt that we could get her scheduled for virtual visit so she can have all questions answered and see if meds could also be filled and pt verbalized understanding. Virtual visit scheduled for pt with [facility] tomorrow, 7/23. Nothing further needed.

## 2018-09-14 ENCOUNTER — Ambulatory Visit (INDEPENDENT_AMBULATORY_CARE_PROVIDER_SITE_OTHER): Payer: BC Managed Care – PPO | Admitting: Pulmonary Disease

## 2018-09-14 ENCOUNTER — Encounter: Payer: Self-pay | Admitting: Pulmonary Disease

## 2018-09-14 ENCOUNTER — Other Ambulatory Visit: Payer: Self-pay

## 2018-09-14 ENCOUNTER — Telehealth: Payer: Self-pay | Admitting: Pulmonary Disease

## 2018-09-14 DIAGNOSIS — J849 Interstitial pulmonary disease, unspecified: Secondary | ICD-10-CM

## 2018-09-14 DIAGNOSIS — R059 Cough, unspecified: Secondary | ICD-10-CM

## 2018-09-14 DIAGNOSIS — R05 Cough: Secondary | ICD-10-CM | POA: Diagnosis not present

## 2018-09-14 MED ORDER — BENZONATATE 200 MG PO CAPS: 200.0000 mg | ORAL_CAPSULE | Freq: Three times a day (TID) | ORAL | 1 refills | Status: DC | PRN

## 2018-09-14 NOTE — Assessment & Plan Note (Signed)
Plan: Tessalon Perles sent and can use 1 Perle every 8 hours as needed for cough

## 2018-09-14 NOTE — Telephone Encounter (Signed)
09/14/2018 2138  Dr. [name],  Patient completed a tele-visit with our office today to review lab work.  Based off my review lab work looks fairly stable in comparison to previous labs.  Patient is currently on p.o. Cytoxan.    Patient is wondering if she is going to be maintained on p.o. Cytoxan or if you are believing that patient should be transitioned back to IV Cytoxan.  She would like for you to review the lab work.  Please see tele-visit note if you have additional concerns or comments please let us know so I can follow-up with the patient  Patient also wanted to notify you that she is having back pain but this is improved since having a massage as well as changing positions and pay more attention to her posture.  She just wanted to ensure that this was not a side effect of 0FEV.   Please let me know your thoughts,  Wyn Quaker, FNP

## 2018-09-14 NOTE — Patient Instructions (Addendum)
Lab work currently stable I will discuss with Dr. [name]  I have sent in a prescription for Tessalon Perles  Continue to work on increasing physical activity and change in your positions to help with back pain  Continue Cytoxan and OFEV   Return in about 4 weeks (around 10/12/2018), or if symptoms worsen or fail to improve, for Follow up with Wyn Quaker FNP-C.   Coronavirus (COVID-19) Are you at risk?  Are you at risk for the Coronavirus (COVID-19)?  To be considered HIGH RISK for Coronavirus (COVID-19), you have to meet the following criteria:  . Traveled to [place], [place], [place], [place] or [place]; or in the [place] to [place], [place], [place], or [place]; and have fever, cough, and shortness of breath within the last 2 weeks of travel OR . Been in close contact with a person diagnosed with COVID-19 within the last 2 weeks and have fever, cough, and shortness of breath . IF YOU DO NOT MEET THESE CRITERIA, YOU ARE CONSIDERED LOW RISK FOR COVID-19.  What to do if you are HIGH RISK for COVID-19?  Marland Kitchen If you are having a medical emergency, call 911. . Seek medical care right away. Before you go to a doctor's office, urgent care or emergency department, call ahead and tell them about your recent travel, contact with someone diagnosed with COVID-19, and your symptoms. You should receive instructions from your physician's office regarding next steps of care.  . When you arrive at healthcare provider, tell the healthcare staff immediately you have returned from visiting [place], [place], [place], [place] or [place]; or traveled in the [place] to [place], [place], [place], or [place]; in the last two weeks or you have been in close contact with a person diagnosed with COVID-19 in the last 2 weeks.   . Tell the health care staff about your symptoms: fever, cough and shortness of breath. . After you have been seen by a medical provider, you will be either: o Tested for  (COVID-19) and discharged home on quarantine except to seek medical care if symptoms worsen, and asked to  - Stay home and avoid contact with others until you get your results (4-5 days)  - Avoid travel on public transportation if possible (such as bus, train, or airplane) or o Sent to the Emergency Department by EMS for evaluation, COVID-19 testing, and possible admission depending on your condition and test results.  What to do if you are LOW RISK for COVID-19?  Reduce your risk of any infection by using the same precautions used for avoiding the common cold or flu:  Marland Kitchen Wash your hands often with soap and warm water for at least 20 seconds.  If soap and water are not readily available, use an alcohol-based hand sanitizer with at least 60% alcohol.  . If coughing or sneezing, cover your mouth and nose by coughing or sneezing into the elbow areas of your shirt or coat, into a tissue or into your sleeve (not your hands). . Avoid shaking hands with others and consider head nods or verbal greetings only. . Avoid touching your eyes, nose, or mouth with unwashed hands.  . Avoid close contact with people who are sick. . Avoid places or events with large numbers of people in one location, like concerts or sporting events. . Carefully consider travel plans you have or are making. . If you are planning any travel outside or inside the [name], visit the CDC's Travelers' Health  webpage for the latest health notices. . If you have some symptoms but not all symptoms, continue to monitor at home and seek medical attention if your symptoms worsen. . If you are having a medical emergency, call 911.   [place] / e-Visit: [email or web address]         [facility]: [facility]: [phone]                   [facility]: [phone]           It is flu  season:   >>> Best ways to protect herself from the flu: Receive the yearly flu vaccine, practice good hand hygiene washing with soap and also using hand sanitizer when available, eat a nutritious meals, get adequate rest, hydrate appropriately   Please contact the office if your symptoms worsen or you have concerns that you are not improving.   Thank you for choosing [facility] for your healthcare, and for allowing [name] to partner with you on your healthcare journey. I am thankful to be able to provide care to you today.   Wyn Quaker FNP-C

## 2018-09-14 NOTE — Assessment & Plan Note (Signed)
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 Cytoxan due to COVID-19 pandemic Managed on OFEV  Plan: Continue Bactrim Continue oxygen therapy Continue Ofev Continue IV Cytoxan Follow-up with Dr. [name] in 2 to 4 weeks I will touch base with Dr. [name] regarding your recent lab work

## 2018-09-14 NOTE — Progress Notes (Signed)
Virtual Visit via Telephone Note  I connected with Tasha Ballard on 09/14/18 at 11:30 AM EDT by telephone and verified that I am speaking with the correct person using two identifiers.  Location: Patient: Home Provider: [facility] - [address], [place], [place], [place] [address]   I discussed the limitations, risks, security and privacy concerns of performing an evaluation and management service by telephone and the availability of in person appointments. I also discussed with the patient that there may be a patient responsible charge related to this service. The patient expressed understanding and agreed to proceed.  Patient consented to consult via telephone: Yes [name]     History of Present Illness:  48 year old female followed in our office for interstitial lung disease/progressive NSIP, chronic respiratory failure, high risk drug treatment (chronic prednisone, Bactrim prophylaxis, Cytoxan, OFEV)  PMH: Morbid obesity (currently evaluated by Tasha Ballard at the weight loss clinic) Smoker/ Smoking History: Never smoker Maintenance: Cytoxan, chronic prednisone, Bactrim prophylaxis, OFEV Pt of: Tasha Ballard  Chief complaint: Review lab work, and discuss treatment plan   48 year old female never smoker completing a tele-visit with our office today to review recent lab work as well as discuss upcoming treatment plan.  Patient is followed in our office for interstitial lung disease progressive NSIP as well as chronic respiratory failure.  Patient is managed on p.o. Cytoxan, chronic prednisone, OFEV.  Patient was previously managed on IV Cytoxan but this was stopped during the COVID-19 pandemic to help limit patient's overall exposure.  Patient has also been having occasional episodes of back pain which has improved after massage therapy and changing positions at home with increasing physical activity.  Patient initially had  concerns this may have been a side effect to OFEV.  Patient reporting today that symptoms have improved since she is started to be more diligent with her posture as well as after having a massage.  Patient is also requesting a refill of Tessalon Perles this was previously managed by [facility].  Observations/Objective:  09/13/2018-CBC with differential- WBC 7.6, hemoglobin 12.8, neutrophils relative 87.3, lymphocytes relative 3.4, lymphs 0.3,  09/13/2018-urinalysis- normal  09/13/2018-c-Met- stable, chloride 92, glucose 133  -November 09, 2017: Echocardiogram ejection fraction 70-75% with grade 2 diastolic dysfunction.  Right ventricle was mildly enlarged but global RV function was felt to be normal.  No pericardial effusion no obvious right-to-left shunt at rest or cough or Valsalva on agitated saline contrast exam and stage I right ventricular diastolic dysfunction.  Pulmonary artery systolic pressure at 45 mmHg  Lung function November 08, 2017: FVC 0.9 L / 27%, DLCO 5.86/29%  HRCT 11/08/17 Rt diaphragm elevation ? Eventration v paresis. Sniff test recommendd  Esophagogram November 11, 2017 - > 11/11/17 Tasha Ballard - Normal  01/02/2018- split-night sleep study- negative for OSA   11/09/2017-2D echocardiogram, left ventricular systolic function is hyperdynamic, LV EF 70 to 75% and stage II diastolic dysfunction, right ventricular size is mildly enlarged, right ventricular systolic pressure is 45, very poor image quality,  Assessment and Plan:  No problem-specific Assessment & Plan notes found for this encounter.   Follow Up Instructions:  Return in about 4 weeks (around 10/12/2018), or if symptoms worsen or fail to improve, for Follow up with Tasha Ballard.   I discussed the assessment and treatment plan with the patient. The patient was provided an opportunity to ask questions and all were answered. The patient agreed with the  plan and demonstrated an understanding of the  instructions.   The patient was advised to call back or seek an in-person evaluation if the symptoms worsen or if the condition fails to improve as anticipated.  I provided 23 minutes of non-face-to-face time during this encounter.   Tasha Rinne, NP

## 2018-09-19 ENCOUNTER — Ambulatory Visit: Payer: BC Managed Care – PPO | Admitting: Internal Medicine

## 2018-09-19 NOTE — Telephone Encounter (Signed)
MR, please advise on this for pt. Thanks! 

## 2018-09-25 MED FILL — CYCLOPHOSPHAMIDE 50 MG CAPS: 50 | 30 days supply | Qty: 30 | Fill #1

## 2018-09-25 NOTE — Telephone Encounter (Signed)
Called spoke with patient and discussed Dr Golden Pop recommendations.     Patient will continue the po Cytoxan.    Patient, spouse and son are all doing well and have no COVID symptoms  Patient will return in early Sept for labs - orders have been placed.  MR, patient asks if you will also check her thyroid so that she does not have to make an extra trip to have this done for her PCP.  Please advise, thank you.

## 2018-09-25 NOTE — Telephone Encounter (Signed)
Noted. Will reach out to him today.   Tasha Ballard

## 2018-09-25 NOTE — Telephone Encounter (Signed)
Oops missed this message - >   Back pain not from ofev  pls continue po cytoxan - is better this way   She has newr covid exposure - pls check if husand, son and she are ok?  Labs in 1 month - cbc bmet, mag, phos, lft and UA

## 2018-09-25 NOTE — Telephone Encounter (Signed)
Still have not received a response from MR in regards to recommendations. Routing back to Francis as an FYI that MR still hasn't responded.

## 2018-09-26 ENCOUNTER — Other Ambulatory Visit: Payer: Self-pay | Admitting: Adult Health

## 2018-09-26 ENCOUNTER — Other Ambulatory Visit: Payer: Self-pay | Admitting: *Deleted

## 2018-09-26 MED ORDER — CYCLOPHOSPHAMIDE 50 MG PO CAPS: 100.0000 mg | ORAL_CAPSULE | Freq: Every day | ORAL | 2 refills | Status: DC

## 2018-09-26 NOTE — Telephone Encounter (Signed)
Thank you   Brian  

## 2018-09-27 NOTE — Telephone Encounter (Signed)
MR, please advise if you are okay having pt get thyroid labs done as well when she comes back to office in 1 month for the labwork.

## 2018-09-27 NOTE — Telephone Encounter (Signed)
Yes that is fine

## 2018-09-28 MED ORDER — CYCLOPHOSPHAMIDE 50 MG PO CAPS: 100.0000 mg | ORAL_CAPSULE | Freq: Every day | ORAL | 2 refills | Status: DC

## 2018-09-28 MED FILL — CYCLOPHOSPHAMIDE 50 MG CAPS: 50 | 30 days supply | Qty: 60 | Fill #0

## 2018-09-28 NOTE — Telephone Encounter (Signed)
Cyclophosphamide 100 mg Oral Daily, Give on an empty stomach 1 hour before or 2 hours after meals. (which would be for pt to take 2 of the 39m capsules to equal 105m   Called and spoke with pt to see if she had received any of the Cytoxan and pt said that [facility] mail her a supply of the [id number] with it being 60 capsules that was sent in and pt to take two of the 73m49mpsules to make up for the 100mg58m only received half of the medication that she takes regularly.  Called and spoke with [facility] to see what the instructions they had on the Rx that had been called in and she said that it was for pt to take 1 73mg 41mule once daily but 30 capsules were sent in which would only be 15 day supply for pt due to pt taking 2 capsules daily to be 100mg w46m is currently what pt takes. Amber said that it seemed like an old Rx had been filled prior to instructions being changed to what the current instructions are.  Amber asked to have a new Rx sent with the correct instructions which I have done.   Called and spoke with pt letting her know the Rx was taken care of and pt verbalized understanding. Nothing further needed.

## 2018-09-28 NOTE — Telephone Encounter (Signed)
Called and spoke with patient. She states [facility] never received the order. Looks like order was phoned in to [facility] on 09/26/18. I have tried to reorder or discontinue medication and have it sent to [facility] as [facility] does not supply medication; I do not have authorization to do either action for medication and patient only received half of what she takes regularly.  TP please advise

## 2018-09-28 NOTE — Telephone Encounter (Signed)
PT RETURNING CALL AND CAN BE REACHED @ [phone].Tasha Ballard

## 2018-09-28 NOTE — Telephone Encounter (Signed)
Called spoke with patient. I placed order for TSH.  Patient also had a question regarding her cytoxin. She states she received it from [facility] but only a 30d supply of the 40m. Looking back and the new order placed on 09/26/18 it was sent to [facility], so patient is going to call there and check to see if they have the correct Rx and call office back.

## 2018-09-28 NOTE — Telephone Encounter (Signed)
Please call pharmacy to correct this issue

## 2018-10-04 ENCOUNTER — Encounter: Payer: Self-pay | Admitting: Pulmonary Disease

## 2018-10-04 ENCOUNTER — Telehealth (INDEPENDENT_AMBULATORY_CARE_PROVIDER_SITE_OTHER): Payer: BC Managed Care – PPO | Admitting: Pulmonary Disease

## 2018-10-04 DIAGNOSIS — R05 Cough: Secondary | ICD-10-CM

## 2018-10-04 DIAGNOSIS — J849 Interstitial pulmonary disease, unspecified: Secondary | ICD-10-CM

## 2018-10-04 DIAGNOSIS — J029 Acute pharyngitis, unspecified: Secondary | ICD-10-CM | POA: Diagnosis not present

## 2018-10-04 DIAGNOSIS — R059 Cough, unspecified: Secondary | ICD-10-CM

## 2018-10-04 DIAGNOSIS — Z79899 Other long term (current) drug therapy: Secondary | ICD-10-CM | POA: Insufficient documentation

## 2018-10-04 MED ORDER — AMOXICILLIN-POT CLAVULANATE 875-125 MG PO TABS: 1.0000 | ORAL_TABLET | Freq: Two times a day (BID) | ORAL | 0 refills | Status: DC

## 2018-10-04 NOTE — Assessment & Plan Note (Signed)
Plan:  May benefit from in person pharmacy medication review on 10/20/2018 ov

## 2018-10-04 NOTE — Assessment & Plan Note (Signed)
Exposure to [place] around 3 weeks ago   Plan:  Outpatient covid testing

## 2018-10-04 NOTE — Patient Instructions (Addendum)
You were seen today by Lauraine Rinne, NP  for:      ICD-10-CM   1. ILD (interstitial lung disease) ([place])  J84.9   2. Cough  R05 amoxicillin-clavulanate (AUGMENTIN) 875-125 MG tablet    Novel Coronavirus, NAA ([facility])  3. Acute sore throat  J02.9 Novel Coronavirus, NAA ([facility])     We recommend today:   Meds ordered this encounter  Medications  . amoxicillin-clavulanate (AUGMENTIN) 875-125 MG tablet    Sig: Take 1 tablet by mouth 2 (two) times daily.    Dispense:  20 tablet    Refill:  0   Hold Bactrim when taking Augmentin  Please go complete out patient covid19 testing based on your symptoms   COVID Testing Site Locations (For sick patients only, pre-procedure is done differently)  . [place] [address] [place], [place] [address] . [place]  (on [employer])  . [address], [place] (across from [facility])  Continue all other breathing medications at this time   Follow Up:    Return in about 2 weeks (around 10/18/2018), or if symptoms worsen or fail to improve, for Follow up with Dr. Purnell Shoemaker.   Please do your part to reduce the spread of COVID-19:      Reduce your risk of any infection  and COVID19 by using the similar precautions used for avoiding the common cold or flu:  Marland Kitchen Wash your hands often with soap and warm water for at least 20 seconds.  If soap and water are not readily available, use an alcohol-based hand sanitizer with at least 60% alcohol.  . If coughing or sneezing, cover your mouth and nose by coughing or sneezing into the elbow areas of your shirt or coat, into a tissue or into your sleeve (not your hands). Langley Gauss A MASK when in public  . Avoid shaking hands with others and consider head nods or verbal greetings only. . Avoid touching your eyes, nose, or mouth with unwashed hands.  . Avoid close contact with people who are sick. . Avoid  places or events with large numbers of people in one location, like concerts or sporting events. . If you have some symptoms but not all symptoms, continue to monitor at home and seek medical attention if your symptoms worsen. . If you are having a medical emergency, call 911.   [place] / e-Visit: [email or web address]         [facility]: [facility]: [phone]                   [facility]: [phone]     It is flu season:   >>> Best ways to protect herself from the flu: Receive the yearly flu vaccine, practice good hand hygiene washing with soap and also using hand sanitizer when available, eat a nutritious meals, get adequate rest, hydrate appropriately   Please contact the office if your symptoms worsen or you have concerns that you are not improving.   Thank you for choosing [facility] for your healthcare, and for allowing [name] to partner with you on your healthcare journey. I am thankful to be able to provide care to you today.   Wyn Quaker FNP-C

## 2018-10-04 NOTE — Assessment & Plan Note (Signed)
Plan:  Augmentin today  Outpt covid testing  Hold Bactrim

## 2018-10-04 NOTE — Assessment & Plan Note (Signed)
Assessment: Progressive NSIP diagnosed by [facility] since receiving IV Cytoxan, transition to p.o. Cytoxan due to COVID-19 pandemic Managed on OFEV  Plan: Hold Bactrim Start Augmentin  Outpatient COVID testing  Continue oxygen therapy Continue Ofev Continue IV Cytoxan Follow-up with Dr. [name] in 2 to 4 weeks

## 2018-10-04 NOTE — Progress Notes (Signed)
Virtual Visit via Video Note  I connected with Tasha Ballard on 10/04/18 at 12:00 PM EDT by a video enabled telemedicine application and verified that I am speaking with the correct person using two identifiers.  Location: Patient: Home Provider: Office - [facility] - [address], [address], [place], [place] [address]  I discussed the limitations of evaluation and management by telemedicine and the availability of in person appointments. The patient expressed understanding and agreed to proceed. I also discussed with the patient that there may be a patient responsible charge related to this service. The patient expressed understanding and agreed to proceed.  Patient consented to consult via telephone: Yes [name]   History of Present Illness: 48 year old female followed in our office for interstitial lung disease/progressive NSIP, chronic respiratory failure, high risk drug treatment (chronic prednisone, Bactrim prophylaxis, Cytoxan, OFEV)  PMH: Morbid obesity (currently evaluated by Dr. Leafy Ro at the weight loss clinic) Smoker/ Smoking History: Never smoker Maintenance: Cytoxan, chronic prednisone, Bactrim prophylaxis, OFEV Pt of: Dr. [name]  Chief complaint: Nausea, Fatigue    48 year old female followed in our office for ILD progressive NSIP managed on oral Cytoxan, chronic prednisone, and 05.  Patient contacted our office today to be scheduled for an acute video visit based off of worsened shortness of breath, fatigue cough and strep throat-like symptoms for the last week.  Patient also reports she has had some increased nausea.  She feels that the nausea though is more of a chronic symptom.  She is more concerned about the acute symptoms of the fatigue, cough and strep throat.  She reports that 3 weeks ago multiple family members tested positive for COVID-19.  Her brother-in-law specifically tested positive who works with her husband.   Her husband remains asymptomatic.  Nobody has been formally tested in her house for COVID-19.  Patient reports that Dr. [name] was aware of her symptoms.  Dr. [name] recommended quarantined to contact our office if symptoms worsen.  Patient did that today.   Observations/Objective:  Physical exam: Able to talk in full sentences Does not appear distressed Unable to fully visualize back of throat on video chat today Nasal cannula applied   09/13/2018-CBC with differential- WBC 7.6, hemoglobin 12.8, neutrophils relative 87.3, lymphocytes relative 3.4, lymphs 0.3,  09/13/2018-urinalysis- normal  09/13/2018-c-Met- stable, chloride 92, glucose 133  -November 09, 2017: Echocardiogram ejection fraction 70-75% with grade 2 diastolic dysfunction.  Right ventricle was mildly enlarged but global RV function was felt to be normal.  No pericardial effusion no obvious right-to-left shunt at rest or cough or Valsalva on agitated saline contrast exam and stage I right ventricular diastolic dysfunction.  Pulmonary artery systolic pressure at 45 mmHg  Lung function November 08, 2017: FVC 0.9 L / 27%, DLCO 5.86/29%  HRCT 11/08/17 Rt diaphragm elevation ? Eventration v paresis. Sniff test recommendd  Esophagogram November 11, 2017 - > 11/11/17 [name] - Normal  01/02/2018- split-night sleep study- negative for OSA   11/09/2017-2D echocardiogram, left ventricular systolic function is hyperdynamic, LV EF 70 to 75% and stage II diastolic dysfunction, right ventricular size is mildly enlarged, right ventricular systolic pressure is 45, very poor image quality,  Assessment and Plan:  ILD (interstitial lung disease) ([place]) Assessment: Progressive NSIP diagnosed by [employer] Improvement since receiving IV Cytoxan, transition to p.o. Cytoxan due to COVID-19 pandemic Managed on OFEV  Plan: Hold Bactrim Start Augmentin  Outpatient COVID testing  Continue oxygen therapy Continue  Ofev Continue IV Cytoxan Follow-up with Dr. [name] in 2 to 4 weeks   Cough Exposure to [place] around 3 weeks ago   Plan:  Outpatient covid testing   Acute sore throat Plan:  Augmentin today  Outpt covid testing  Hold Bactrim    Medication management Plan:  May benefit from in person pharmacy medication review on 10/20/2018 ov    Follow Up Instructions:  Return in about 2 weeks (around 10/18/2018), or if symptoms worsen or fail to improve, for Follow up with Dr. Purnell Shoemaker.    I discussed the assessment and treatment plan with the patient. The patient was provided an opportunity to ask questions and all were answered. The patient agreed with the plan and demonstrated an understanding of the instructions.   The patient was advised to call back or seek an in-person evaluation if the symptoms worsen or if the condition fails to improve as anticipated.  I provided 26 minutes of non-face-to-face time during this encounter.   Lauraine Rinne, NP

## 2018-10-05 ENCOUNTER — Other Ambulatory Visit: Payer: Self-pay

## 2018-10-05 DIAGNOSIS — Z20822 Contact with and (suspected) exposure to covid-19: Secondary | ICD-10-CM

## 2018-10-06 ENCOUNTER — Telehealth: Payer: Self-pay | Admitting: Pharmacy Technician

## 2018-10-06 NOTE — Telephone Encounter (Signed)
Will leave encounter open and await return call from pt.

## 2018-10-06 NOTE — Telephone Encounter (Signed)
Left message for patient to advise about Dual visit on 10/20/18 with Pharmacy team and provider. Advised patient to expect appointment to be about 1 hour and to bring all medications to appointment.  10:26 AM Beatriz Chancellor, CPhT

## 2018-10-07 LAB — NOVEL CORONAVIRUS, NAA: SARS-CoV-2, NAA: NOT DETECTED

## 2018-10-07 LAB — SPECIMEN STATUS REPORT

## 2018-10-08 NOTE — Progress Notes (Signed)
Negative covid test. This is good news.   Tasha Ballard

## 2018-10-20 ENCOUNTER — Other Ambulatory Visit: Payer: Self-pay

## 2018-10-20 ENCOUNTER — Telehealth: Payer: Self-pay | Admitting: Internal Medicine

## 2018-10-20 ENCOUNTER — Ambulatory Visit (INDEPENDENT_AMBULATORY_CARE_PROVIDER_SITE_OTHER): Payer: BC Managed Care – PPO | Admitting: Internal Medicine

## 2018-10-20 ENCOUNTER — Ambulatory Visit (INDEPENDENT_AMBULATORY_CARE_PROVIDER_SITE_OTHER): Payer: BC Managed Care – PPO | Admitting: Pharmacist

## 2018-10-20 ENCOUNTER — Encounter: Payer: Self-pay | Admitting: Internal Medicine

## 2018-10-20 VITALS — BP 128/74 | HR 96 | Temp 98.2°F | Ht 62.0 in | Wt 224.6 lb

## 2018-10-20 DIAGNOSIS — J8489 Other specified interstitial pulmonary diseases: Secondary | ICD-10-CM

## 2018-10-20 DIAGNOSIS — Z5181 Encounter for therapeutic drug level monitoring: Secondary | ICD-10-CM | POA: Diagnosis not present

## 2018-10-20 DIAGNOSIS — J849 Interstitial pulmonary disease, unspecified: Secondary | ICD-10-CM | POA: Diagnosis not present

## 2018-10-20 DIAGNOSIS — R112 Nausea with vomiting, unspecified: Secondary | ICD-10-CM

## 2018-10-20 DIAGNOSIS — J9611 Chronic respiratory failure with hypoxia: Secondary | ICD-10-CM

## 2018-10-20 DIAGNOSIS — Z79899 Other long term (current) drug therapy: Secondary | ICD-10-CM

## 2018-10-20 DIAGNOSIS — Z23 Encounter for immunization: Secondary | ICD-10-CM

## 2018-10-20 DIAGNOSIS — T50905A Adverse effect of unspecified drugs, medicaments and biological substances, initial encounter: Secondary | ICD-10-CM

## 2018-10-20 LAB — BASIC METABOLIC PANEL
BUN: 12 mg/dL (ref 6–23)
CO2: 34 mEq/L — ABNORMAL HIGH (ref 19–32)
Calcium: 9.9 mg/dL (ref 8.4–10.5)
Chloride: 90 mEq/L — ABNORMAL LOW (ref 96–112)
Creatinine, Ser: 0.99 mg/dL (ref 0.40–1.20)
GFR: 59.34 mL/min — ABNORMAL LOW (ref >60.00)
Glucose, Bld: 140 mg/dL — ABNORMAL HIGH (ref 70–99)
Potassium: 3.3 mEq/L — ABNORMAL LOW (ref 3.5–5.1)
Sodium: 135 mEq/L (ref 135–145)

## 2018-10-20 LAB — URINALYSIS
Bilirubin Urine: NEGATIVE
Hgb urine dipstick: NEGATIVE
Ketones, ur: NEGATIVE
Leukocytes,Ua: NEGATIVE
Nitrite: NEGATIVE
Specific Gravity, Urine: 1.01 (ref 1.000–1.030)
Total Protein, Urine: NEGATIVE
Urine Glucose: NEGATIVE
Urobilinogen, UA: 0.2 (ref 0.0–1.0)
pH: 7.5 (ref 5.0–8.0)

## 2018-10-20 LAB — PHOSPHORUS: Phosphorus: 4.1 mg/dL (ref 2.3–4.6)

## 2018-10-20 LAB — CBC
HCT: 39.3 % (ref 36.0–46.0)
Hemoglobin: 12.7 g/dL (ref 12.0–15.0)
MCHC: 32.4 g/dL (ref 30.0–36.0)
MCV: 97.9 fl (ref 78.0–100.0)
Platelets: 223 10*3/uL (ref 150.0–400.0)
RBC: 4.02 Mil/uL (ref 3.87–5.11)
RDW: 15.1 % (ref 11.5–15.5)
WBC: 7.2 10*3/uL (ref 4.0–10.5)

## 2018-10-20 LAB — HEPATIC FUNCTION PANEL
ALT: 31 U/L (ref 0–35)
AST: 24 U/L (ref 0–37)
Albumin: 4 g/dL (ref 3.5–5.2)
Alkaline Phosphatase: 55 U/L (ref 39–117)
Bilirubin, Direct: 0.1 mg/dL (ref 0.0–0.3)
Total Bilirubin: 0.5 mg/dL (ref 0.2–1.2)
Total Protein: 6.7 g/dL (ref 6.0–8.3)

## 2018-10-20 LAB — TSH: TSH: 0.96 u[IU]/mL (ref 0.35–4.50)

## 2018-10-20 LAB — MAGNESIUM: Magnesium: 1.6 mg/dL (ref 1.5–2.5)

## 2018-10-20 MED ORDER — SACCHAROMYCES BOULARDII 250 MG PO CAPS: 250.0000 mg | ORAL_CAPSULE | Freq: Two times a day (BID) | ORAL | 0 refills | Status: DC

## 2018-10-20 NOTE — Progress Notes (Signed)
**Patient seen by the pharmacy team in conjunction with Dr. [name].  Please refer to office visit encounter on 10/20/2018 for provider assessment and plan.**  Pharmacy Note  Subjective:   Patient presents today to the [facility] to see Dr. [name]. Patient seen by the pharmacist for medication review.  Objective: Current Outpatient Medications on File Prior to Visit  Medication Sig Dispense Refill  . benzonatate (TESSALON) 200 MG capsule Take 1 capsule (200 mg total) by mouth 3 (three) times daily as needed for cough. 30 capsule 1  . carvedilol (COREG) 6.25 MG tablet Take 6.25 mg by mouth 2 (two) times daily with a meal.    . clindamycin (CLEOCIN T) 1 % lotion     . cyclophosphamide (CYTOXAN) 50 MG capsule Take 2 capsules (100 mg total) by mouth daily. Give on an empty stomach 1 hour before or 2 hours after meals. 60 capsule 2  . furosemide (LASIX) 40 MG tablet Take 40 mg by mouth See admin instructions. Take 1 tablet (40 mg) daily, may repeat dose if needed for fluid retention.    . gabapentin (NEURONTIN) 100 MG capsule Take 1 capsule by mouth as needed.    . metFORMIN (GLUCOPHAGE) 500 MG tablet Take 500 mg by mouth 2 (two) times daily.     . montelukast (SINGULAIR) 10 MG tablet Take 10 mg by mouth daily after supper.    Marland Kitchen OFEV 150 MG CAPS Take 150 mg by mouth 2 (two) times daily.  11  . ondansetron (ZOFRAN) 8 MG tablet TAKE 1 TABLET BY MOUTH EVERY 8 HOURS AS NEEDED FOR NAUSEA & VOMITING 45 tablet 0  . potassium chloride (K-DUR) 10 MEQ tablet Take 2 tablets (20 mEq total) by mouth 2 (two) times daily. 120 tablet 2  . predniSONE (DELTASONE) 10 MG tablet Take 10-2m as needed for SOB (Patient taking differently: Take 10 mg by mouth daily with breakfast. ) 30 tablet 3  . PROAIR HFA 108 (90 Base) MCG/ACT inhaler Inhale 2 puffs into the lungs every 6 (six) hours as needed for wheezing or shortness of breath.  4  . saccharomyces boulardii (FLORASTOR) 250 MG capsule Take 1 capsule (250  mg total) by mouth 2 (two) times daily. 60 capsule 0  . spironolactone (ALDACTONE) 100 MG tablet Take 100 mg by mouth daily.    .Marland Kitchensulfamethoxazole-trimethoprim (BACTRIM) 400-80 MG tablet Once a day while on prednisone (Patient taking differently: Take 1 tablet by mouth every evening. ) 90 tablet 1  . thyroid (ARMOUR) 90 MG tablet Take 90 mg by mouth daily before breakfast.     . Vitamin D, Ergocalciferol, (DRISDOL) 1.25 MG (50000 UT) CAPS capsule TAKE 1 CAPSULE BY MOUTH ONCE WEEKLY 3 capsule 0   No current facility-administered medications on file prior to visit.     CMP     Component Value Date/Time   NA 135 10/20/2018 1123   NA 136 02/13/2018 1118   K 3.3 (L) 10/20/2018 1123   CL 90 (L) 10/20/2018 1123   CO2 34 (H) 10/20/2018 1123   GLUCOSE 140 (H) 10/20/2018 1123   BUN 12 10/20/2018 1123   BUN 11 02/13/2018 1118   CREATININE 0.99 10/20/2018 1123   CALCIUM 9.9 10/20/2018 1123   PROT 6.7 10/20/2018 1123   PROT 6.0 02/13/2018 1118   ALBUMIN 4.0 10/20/2018 1123   ALBUMIN 4.2 02/13/2018 1118   AST 24 10/20/2018 1123   ALT 31 10/20/2018 1123   ALKPHOS 55 10/20/2018 1123   BILITOT 0.5 10/20/2018  1123   BILITOT 0.4 02/13/2018 1118   GFRNONAA >60 07/23/2018 1420   GFRAA >60 07/23/2018 1420   Assessment/Plan: Reviewed medications and no major drug interactions identified except for spironolactone and potassium increasing risk of hyperkalemia. She is also on Lasix.  She takes potassium as needed when she takes her Lasix.  Advised patient of interaction and risk.  Last CMP on 09/13/2018 showed potassium at 4.2. Instructed patient to notify office if she ends up taking the Lasix more or less frequently.  Will continue to monitor.  Patient is on oral Cytoxan.  She tolerated IV Cytoxan in the past but switched to oral due to insurance/administration issues.  She was tolerating oral Cytoxan until she started taking Ofev which increased her nausea. Patient is interested in switching from oral  Cytoxan to IV. Patient is interested in switching from oral Cytoxan to IV.  She prefers to have the infusion at [facility] but is willing to received at the medical short stay.  Will research issues and reach out to insurance to see what restrictions her plan has for administrations sites.  Will call and discuss with patient next Friday.  She is interested in the Shingrix vaccine as she has had shingles in the past.  She is eligible to receive the vaccine based on age. Shingrix is an inactivated vaccine and theoretically safe to use in immunocompromised individuals.  Patient has some reservations as she is on Cytoxan.  Will research for specific studies of use in patients with ILD and on chemotherapy treatment.  Will follow up with patient next Friday.  Patient asked for recommendation of probiotic as she has antibiotic associated diarrhea.  Recommend Florastor 250 mg twice daily for 2 weeks.  She asked for a prescription to be sent to the pharmacy.  Prescription sent to [facility] pharmacy per patient request.  Informed patient insurance might not cover.  Patient verbalized understanding.  All questions encouraged and answered. Instructed patient to call with any other questions or concerns.  Mariella Saa, PharmD, [place], [place] Clinical Specialty Pharmacist [phone]  10/20/2018 12:05 PM

## 2018-10-20 NOTE — Telephone Encounter (Signed)
Patient seen in office today for dual visit.  Nothing further needed at this time.  Closing encounter.  Mariella Saa, PharmD, [place], [place] Clinical Specialty Pharmacist [phone]  10/20/2018 4:45 PM

## 2018-10-20 NOTE — Patient Instructions (Addendum)
Chronic respiratory failure with hypoxia (HCC) ILD (interstitial lung disease) (HCC) NSIP (nonspecific interstitial pneumonia) ([place])  - clinically stable - flu shot 10/20/2018  -restage disease by doing   - spirometry and dlco next 1-2 months  - HRCT next 1-2 months   - Continue low risk covid-19 activities  - continue ofev, cytoxam, bactrim and prednisone for ILD  Drug-induced nausea and vomiting and diarrhea and fatigue Therapeutic drug monitoring High risk medication use  - diarrhea and fatigue could have been due to augmentin, ofev, and bactrim - taking all at same time  - glad you are better from diarrhea   - need to investigate fatigue and could be due to po cytoxam   - I agree we need to look at reverting back to IV cytoxan   PLAN  -Check cbc, bmet, lft, mag, phos and urine analysis = Meet with office pharmacist Amber   - discuss change to IV cytoxan and timing of this - depends on lab resuls and also infusion center availability  - discuss getting shingrix vaccine  - go over medication list with her   Followup  - 1- 2 months but after HRCT/ and PFT

## 2018-10-20 NOTE — Progress Notes (Signed)
Pharmacy Note  Subjective:  Patient presents today to the [facility] to see Dr. [name]. Patient seen by the pharmacist for medication review.  Objective: Current Outpatient Medications on File Prior to Visit  Medication Sig Dispense Refill  . benzonatate (TESSALON) 200 MG capsule Take 1 capsule (200 mg total) by mouth 3 (three) times daily as needed for cough. 30 capsule 1  . carvedilol (COREG) 6.25 MG tablet Take 6.25 mg by mouth 2 (two) times daily with a meal.    . clindamycin (CLEOCIN T) 1 % lotion     . cyclophosphamide (CYTOXAN) 50 MG capsule Take 2 capsules (100 mg total) by mouth daily. Give on an empty stomach 1 hour before or 2 hours after meals. 60 capsule 2  . furosemide (LASIX) 40 MG tablet Take 40 mg by mouth See admin instructions. Take 1 tablet (40 mg) daily, may repeat dose if needed for fluid retention.    . metFORMIN (GLUCOPHAGE) 500 MG tablet Take 500 mg by mouth 2 (two) times daily.     . montelukast (SINGULAIR) 10 MG tablet Take 10 mg by mouth daily after supper.    Marland Kitchen OFEV 150 MG CAPS Take 150 mg by mouth 2 (two) times daily.  11  . ondansetron (ZOFRAN) 8 MG tablet TAKE 1 TABLET BY MOUTH EVERY 8 HOURS AS NEEDED FOR NAUSEA & VOMITING 45 tablet 0  . potassium chloride (K-DUR) 10 MEQ tablet Take 2 tablets (20 mEq total) by mouth 2 (two) times daily. 120 tablet 2  . predniSONE (DELTASONE) 10 MG tablet Take 10-55m as needed for SOB (Patient taking differently: Take 10 mg by mouth daily with breakfast. ) 30 tablet 3  . PROAIR HFA 108 (90 Base) MCG/ACT inhaler Inhale 2 puffs into the lungs every 6 (six) hours as needed for wheezing or shortness of breath.  4  . spironolactone (ALDACTONE) 100 MG tablet Take 100 mg by mouth daily.    .Marland Kitchensulfamethoxazole-trimethoprim (BACTRIM) 400-80 MG tablet Once a day while on prednisone (Patient taking differently: Take 1 tablet by mouth every evening. ) 90 tablet 1  . thyroid (ARMOUR) 90 MG tablet Take 90 mg by mouth daily before  breakfast.     . Vitamin D, Ergocalciferol, (DRISDOL) 1.25 MG (50000 UT) CAPS capsule TAKE 1 CAPSULE BY MOUTH ONCE WEEKLY 3 capsule 0  . gabapentin (NEURONTIN) 100 MG capsule Take 1 capsule by mouth as needed.     No current facility-administered medications on file prior to visit.     CMP     Component Value Date/Time   NA 135 09/13/2018 1124   NA 136 02/13/2018 1118   K 4.2 09/13/2018 1124   CL 92 (L) 09/13/2018 1124   CO2 32 09/13/2018 1124   GLUCOSE 133 (H) 09/13/2018 1124   BUN 12 09/13/2018 1124   BUN 11 02/13/2018 1118   CREATININE 0.82 09/13/2018 1124   CALCIUM 9.9 09/13/2018 1124   PROT 6.5 09/13/2018 1124   PROT 6.0 02/13/2018 1118   ALBUMIN 4.0 09/13/2018 1124   ALBUMIN 4.2 02/13/2018 1118   AST 19 09/13/2018 1124   ALT 24 09/13/2018 1124   ALKPHOS 62 09/13/2018 1124   BILITOT 0.5 09/13/2018 1124   BILITOT 0.4 02/13/2018 1118   GFRNONAA >60 07/23/2018 1420   GFRAA >60 07/23/2018 1420    Assessment/Plan:  Reviewed medications and no major drug interactions identified except for spironolactone and potassium increasing risk of hyperkalemia. She is also on Lasix.  She takes potassium as needed  when she takes her Lasix.  Advised patient of interaction and risk.  Last CMP on 09/13/2018 showed potassium at 4.2. Instructed patient to notify office if she ends up taking the Lasix more or less frequently.  Will continue to monitor.  Patient is on oral Cytoxan.  She tolerated IV Cytoxan in the past but switched to oral due to insurance/administration issues.  She was tolerating oral Cytoxan until she started taking Ofev which increased her nausea. Patient is interested in switching from oral Cytoxan to IV. Patient is interested in switching from oral Cytoxan to IV.  She prefers to have the infusion at [facility] but is willing to received at the medical short stay.  Will research issues and reach out to insurance to see what restrictions her plan has for administrations sites.  Will  call and discuss with patient next Friday.  She is interested in the Shingrix vaccine as she has had shingles in the past.  She is eligible to receive the vaccine based on age. Shingrix is an inactivated vaccine and theoretically safe to use in immunocompromised individuals.  Patient has some reservations as she is on Cytoxan.  Will research for specific studies of use in patients with ILD and on chemotherapy treatment.  Will follow up with patient next Friday.  Patient asked for recommendation of probiotic as she has antibiotic associated diarrhea.  Recommend Florastor 250 mg twice daily for 2 weeks.  She asked for a prescription to be sent to the pharmacy.  Prescription sent to [facility] pharmacy per patient request.  Informed patient insurance might not cover.  Patient verbalized understanding.  All questions encouraged and answered. Instructed patient to call with any other questions or concerns.  Mariella Saa, PharmD, [place], [place] Clinical Specialty Pharmacist [phone]  10/20/2018 12:05 PM

## 2018-10-20 NOTE — Progress Notes (Signed)
-Lung function November 08, 2017: FVC 0.9 L / 27%, DLCO 5.86/29%  - HRCT 11/08/17 Rt diaphragm elevation ? Eventration v paresis.  Esophagogram November 11, 2017 - > 11/11/17 Tasha Ballard - Normal  Dr. Threasa Alpha at [facility].She was started on  IOV 11/22/2017  Chief Complaint  Patient presents with   Consult    consut due to pulmonary fibrosis. Pt has been followed by [facility] and last PFT was peformed 8/14 at [facility]. Pt does have complaints of SOB with exertion which she states has become better due to rehab. Pt also has a dry hacking cough with mild production of mucus. Denies any CP.    Is seen by presents to the ILD clinic.  This is her first visit with Tasha Ballard.  History is obtained from her and review of the [place] where she is to follow-up with Dr. Hortencia Pilar.  Interstitial lung disease clinic integrated interstitial lung disease questionnaire: Symptoms: Shortness of breath for years.  Gradually worse.  Currently level 5 shortness of breath walking up stairs or walking up a hill with oxygen.  Level for shortness of breath for sweeping or vacuuming and level 3 shortness of breath for shopping or picking up things and doing laundry.  Level 3 shortness of breath or standing up from a chair or taking a shower making the bed and level 1 dyspnea for brushing teeth.  She also has a cough for 6 years which is getting worse.  On and off that is phlegm.  Occasional wheezing present there is associated fatigue.  She tells me that she had undiagnosed and unrecognized ILD for years.  This was then picked up at [facility] and then she got referred to [facility]  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  At this time she was placed on CellCept.  Then in 2016 she got somewhat worse and was placed on 2 L nasal cannula continuously.  In 2017 she tells me that she had a aortic aneurysm and this was  repaired at [facility].  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  And then for the last year and a half or so she has been on significantly worsening hypoxemia.  In the spring 2019 she is requiring 8 L at rest and 15 L at exertion.  Apparently Dr. Lauris Chroman then recommended Rituxan infusions which she got to 60 days apart with the last one being approximately in April of May 2019.  After that she is somewhat improved requiring only 9-10 L with exertion.  She has been attending pulmonary rehabilitation which has been helping.  She is obese and unable to lose weight although she is lost some.  She believes a lot of her oxygen needs might be related to deviated nasal septum/septal perforation.  She subsequently followed up with Dr. Ruthann Cancer at [facility] in the summer 2019.  I reviewed the notes.  Palliative care was recommended because of progressive respiratory failure.  She has never seen the transplant clinic at [facility] but she recommends that because of obesity and diaphragmatic dysfunction that she would not be a good candidate for transplant .  She was then frustrated with experience because she wants to fight the disease.  She then drove to [place] and saw Dr. Elisabeth Cara at [facility] 1 week ago.  She spent substantial number  of days there.  At this point in time she showed me an email from him that suggest he might have myositis-ILD but they would like to review her CT scans and pathology from [facility].  Apparently she had significant amount of blood work and other tests at [facility]  I do not have access to these records at this point.  She believes that she wants to undergo transplant.  She thinks that she would be able to have transplant in [place] despite a high BMI.  Currently in [place] she is somewhat stable attending pulmonary rehabilitation.  She is interested in pulmonary trials  Past  medical history  -Tested for sleep apnea at [facility] and was told to be negative.  But at [facility] in September 2019 they recommended a repeat sleep study -Denied all forms of connective tissue disease diagnosis and vasculitis.  Although at [facility] September 2019 when she was seen last week she was told she might have myositis-ILD but this is pending confirmation in the multidisciplinary case conference -She does report positive for diabetes and thyroid disease and mononucleosis in the past but denies any tuberculosis or kidney disease   Review of systems -Positive for fatigue for a few years, arthralgia for a few years but denies dysphagia.  Positive for dry eyes and mouth for few years.  No Raynaud.  No recurrent fever.  No weight loss.  No acid reflux but does admit to snoring for a few years.  No rash no ulcers.  Family history of lung disease COPD and a long but no pulmonary fibrosis.  Dad did have rheumatoid arthritis  Personal exposure history Denies tobacco, vaping, marijuana, cocaine, IV drugs  Home and hobby history  [place] which is 50 years old  Occupational history -She does stay in a condition spaces.  122 point occupational questionnaire completely negative  Medication history -She has a history of allergy to Macrobid/nitrofurantoin.  Do not know when she took it.  She is currently on immunosuppressive regimen of CellCept, Bactrim and prednisone.  CellCept since 2013 and prednisone since 2012.  She did get Rituxan x2 doses with the last one being in spring 2019.  She feels her hypoxemia improved somewhat after that.     has a past medical history of Aneurysm ([place]), Diabetes mellitus without complication ([place]), and Thyroid disease.   reports that she has never smoked. She has never used smokeless tobacco.   OV 12/21/2017  Subjective:  Patient ID: Tasha Ballard, female , DOB: [birth year] , age 54 y.o. , MRN: [id number] ,  ADDRESS: [address] [place] [address]   12/21/2017 -   Chief Complaint  Patient presents with   Follow-up    ILD     HPI YOMARIS PALECEK 48 y.o. -returns for follow-up.  She brings her mother along who I am meeting for the first time.  This visit is specifically focused on evaluation from [place].  Yesterday I did speak to the interstitial lung disease specialist at [facility]. Threasa Alpha.  He told me he was finally able to receive and review the surgical lung biopsy from [facility].  He and his pathologist agreed with the diagnosis of progressive NSIP.  His main recommendation was to switch patient to Cytoxan.  In addition also aggressively pursue weight loss even if it means for bariatric surgery approach so we can get ready for transplant.  He agreed the prognosis is very poor.  At this point in time patient compared to the last visit is stable.  She is using more than 10 L of oxygen up to 15 L of oxygen.  She is interested in Cytoxan.  Dr. Elisabeth Cara indicated to me that efficacy wise oral and IV are similar but he felt IV would be a little bit safer from a urologic standpoint.  Patient is interested in weight loss.  She says she has had difficulty losing weight despite being hypocaloric.  She is interested in nutritional referral.  In addition she wants a sleep specialist referral.  She says [place] OSA was ruled out.  She has significant insomnia and daytime tiredness.  She said that [place] recommended sleep referral ASAP.  She is registered to participate in the ILD-pro registry study for progressive non-IPF disease.  This was done today.   Investigations at [facility] -November 09, 2017: Echocardiogram ejection fraction 70-75% with grade 2 diastolic dysfunction.  Right ventricle was mildly enlarged but global RV function was felt to be normal.  No pericardial effusion no obvious right-to-left shunt at rest or cough or Valsalva on agitated  saline contrast exam and stage I right ventricular diastolic dysfunction.  Pulmonary artery systolic pressure at 45 mmHg  -Lung function November 08, 2017: FVC 0.9 L / 27%, DLCO 5.86/29%  - HRCT 11/08/17 Rt diaphragm elevation ? Eventration v paresis. Sniff test recommendd  Esophagogram November 11, 2017 - > 11/11/17 Natil Jewsh - Normal    12/22/2017-telephone encounter - Dr. [name] >>>starting Cytoxan 50 mg/day >>>Avoid lots of fluids to avoid cystitis, avoid intercourse without contraception, return in 7 to 10 days to recheck CBC, Bmet, LFTs, UA  12/22/17-office visit-[place] >>>Plan in lab sleep study be ordered, patient will likely need BiPAP with oxygen  12/30/2017  - Visit   48 year old female patient presenting today for one-week follow-up visit after starting Cytoxan.  Patient also get lab work completed today.  Unfortunately patient did not get this completed prior to her office visit she will get afterwards.  Patient reports baseline symptoms of nonproductive cough.  Requiring 4 to 6 L of oxygen at rest as well as 10 to 15 L of oxygen with exertion.  Patient does have her baseline shortness of breath with exertion.  Patient reporting no worsening symptoms since starting Cytoxan.   Patient reports she typically is on 9 to 10 L of oxygen at home.   OV 01/17/2018  Subjective:  Patient ID: Tasha Ballard, female , DOB: [birth year] , age 21 y.o. , MRN: [id number] , ADDRESS: [id number] [place] [address]   01/17/2018 -   Chief Complaint  Patient presents with   Follow-up    2 week f/u, lung transplant, was contacted and waiting on weight loss to continue   PRogressive NSIP Chronic hypoxemic resp failure Morbid Obesity - needs weight loss for transplant Rx regimen   -  cytoxan 17m per day 12/23/17 and increaed to 1016mon 12/23/17.On11/19/19. increase to 12578mer day  And switched to IV cytoxan Q4 week cycle - 1st cycle 02/03/18.   HPI ChrKOURTNEE LAHEY 28.o. -presents for follow-up with her husband.  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toxan 23m104mr day 12/23/17 and increaed to 100mg89m11/1/19.  On 01/10/18.  increase to 125mg 43mday .  Plan is to get blood work today.  She wants a Port-A-Cath placed.  She prefers IV Cytoxan.  I checked with Dr. Threasa Alpha and he feels either p.o. or IV Cytoxan is fine but in the balance IV Cytoxan has a slightly lower incidence of hemorrhagic cystitis and the patient prefers this.  At this point in time she tells me that the Cytoxan seems to be working well for her.  She is less hypoxemic she is able to get by with 5 L at rest.  On the other hand this could be because of weight loss.  However there is some fatigue.  She will have blood work and urine analysis to look for cytotoxicity from Cytoxan.  Based on the newINBUILD  data we discussed about starting nintedanib.  I checked with Dr. Threasa Alpha at [facility] and he is supportive of the plan.  Patient states that she has done extensive research on nintedanib and she is very excited about the possibility of taking this.   Obesity: Weight 231#  11/22/17  -> 221# 01/17/2018.,  She saw the nutritionist at [facility].  She is using an app and tracking her weight on a regular basis.  She is encouraged by the weight loss.  We gave her a low glycemic diet sheet.  She has not been able to see the bariatric surgeon at [facility] but I did run into Dr. Kaylyn Lim in the hallway and he said he would be happy to see her and counsel her.  Although he did acknowledge she would be high risk for surgery.  He is in favor of low carbohydrate diet.  A few weeks ago I met the transplant physician from [facility] and he brought up the idea about losing weight loss drugs.  His preferred drug is phentermine.  We discussed the  possibility about getting an endocrine consult  Lung transplant: [facility] has called her.  They want to BMI less than 30 or 36 I am not sure.  She is working towards his goal.  She has not heard from [facility].  I emailed them and the day after the visit patient is emailed me saying that she has heard from them.  She is very keen to get on the transplant list.    02/03/2018 OV for Direct Admit for IV Cytoxan  Patient presents to the office today for direct admission to [facility] hospital for IV Cytoxan infusion.  She says her breathing is at baseline.  She remains on oxygen 4 to 6 L at rest and 10 to 15 L with activity.  He says her leg swelling is at baseline.  She remains on Lasix 40 mg daily and Aldactone 100 mg.  She is on chronic steroids with prednisone 10 mg daily. She has been on 05 x1 week.  Says she is tolerating with no nausea vomiting or diarrhea.  Does have some mild constipation.  Patient says she had a Port-A-Cath placed 1 week ago. Patient has chronic shortness of breath gets winded with minimal activity.  Has intermittent dry cough.   02/13/2018 Follow up : Progressive NSIP , O2 RF  Patient returns for a one-week follow-up.  Patient was recently admitted for IV Cytoxan under observation status.. Patient had been on oral Cytoxan.  Continue to have progressive worsening of her ILD.  At baseline she is on 4 to 6 L at rest.  And 10 to 15 L with activity.  She is on Aldactone and Lasix.  She has been started on  OFEV 3 weeks ago.  She is tolerating well with no nausea vomiting or diarrhea.  Does have some mild constipation. Since her Cytoxan infusion.  She says she has done well.  She had minimal nausea.  She does feel that her breathing has slightly improved.  Her oxygen demands are decreased.  She is using mainly only 6 L of oxygen.  And doing well with this with good O2 saturations. Says she was able to actually go out to dinner for the first time in a long  time.  She denies any hematuria, chest pain, orthopnea or increased leg swelling.  She is working on weight loss.  She is going to the weight loss center. Labs were done on 12/20 that showed normal [place]bisch blood cell count, platelets and kidney function. Urinalysis was done today.  And is pending.. Plans are for a Cytoxan infusion in 3- 4 weeks from her last infusion.  OV 03/02/2018  Subjective:  Patient ID: Tasha Ballard, female , DOB: 20-Feb-1971 , age 23 y.o. , MRN: [id number] , ADDRESS: [place] [id number]   03/02/2018 -   Chief Complaint  Patient presents with   Follow-up    Appt prior to pt's next Cytoxan infusion.  Pt states she is doing well since last visit. States she can tell that her breathing has improved. Has had some nausea/vomiting and wonders if it is from the [place].   #Morbid Obesity - needs weight loss for transplant - sees Dr Janeal Holmes since dec 2019 #PRogressive NSIP #Chronic hypoxemic resp failure - 2L at rest, 8L with exertion # High risk treatment regimen  -CellCept 2013 through 2019  -Chronic prednisone 2013 -currently taking -Bactrim prophylaxis 2013 --currently taking  - Cytoxan 49m per day 12/23/17 and increaed to 1041mon 12/23/17.On11/19/19. increase to 12520mer day  And switched to IV cytoxan Q4 week cycle - 1st cycle 02/03/18 (s/p portocath 01/26/18)  - Ofev 01/23/18 (approximnate)   HPI ChrBERNADINE MELECIO 35o. -presents for the above issues.  I personally saw her before Thanksgiving 2019.  After that she is seen by nurse practitioner under my supervision as documented above for Cytoxan infusion.  She has had post Cytoxan infusion labs which showed her Croak count is decreasing but still adequate.  Most recent labs was February 13, 2018 at the weight loss clinic with a Kruckenberg count of 5900.  Rest of the labs reviewed and appear adequate and normal.  Including liver function test.  She is developed new onset of intermittent nausea and  vomiting ever since starting nintedanib early December 2019.  This particularly happens with the Christmas meals.  She says this appears at random and is mild and is treated with Zofran.  Given the beneficial effects of nintedanib and progressive non-IPF ILD she is agreed to continue to take this.  She continues on her chronic prednisone and Bactrim prophylaxis.  She is finished 1 cycle of IV Cytoxan February 03, 2018.  She has upcoming second cycle 4 weeks apart on March 06, 2018.  At this point in time she is feeling well.  She feels her ILD has actually improved.  On 2 L at rest she was saturating at 98%.  This is a huge improvement for her.  However when we walked her she still needed 8 L of oxygen with exertion.  She still says this is an improvement because in the past she was using more than 10 L for exertion.  Today my nurse  practitioner will enter the orders for upcoming infusion.  There is no fever.  Morbid obesity: She has seen Dr. Leafy Ro at the weight loss clinic.  I reviewed the notes.  Dietary measures are indicated.  I have written to Dr. Leafy Ro asking about medications to accelerate weight loss given the fact her lung disease is poor prognosis and she is an urgent need for lung transplant and with weight loss being her main barrier.   Obesity: Weight 231#  11/22/17  -> 221# 01/17/2018., -> 222# on 03/02/2018    OV 03/30/2018  Subjective:  Patient ID: Tasha Ballard, female , DOB: 10-12-1970 , age 37 y.o. , MRN: [id number] , ADDRESS: [address] [place] [address]   03/30/2018 -   Chief Complaint  Patient presents with   Follow-up    PFT peformed today.  Pt states she has been doing okay since last visit.     #Morbid Obesity - needs weight loss for transplant - sees Dr Janeal Holmes since dec 2019 #PRogressive NSIP #Chronic hypoxemic resp failure - 2L at rest, 8L with exertion # High risk treatment regimen  -CellCept 2013 through 2019  -Chronic prednisone 2013 -currently  taking -Bactrim prophylaxis 2013 --currently taking  - Cytoxan Oral  13m per day 12/23/17 and increaed to 1061mon 12/23/17.On11/19/19. increase to 12579mer day   - Cytoxan IV : And switched to IV cytoxan Q4 week cycle   - 1st cycle 02/03/18 (s/p portocath 01/26/18)  = 2nd cycle 03/06/2018  - 3rd cycle  -pending 04/03/2018  - Ofev 01/23/18 (approximnate)   HPI ChrSHAVELLE RUNKEL 1o. -presents for follow-up of the above issues.  Her husband is here with her.  In terms of - Obesity: Weight 231#  11/22/17  -> 221# 01/17/2018., -> 222# on 03/02/2018 -> 220# on 03/30/2018  In terms of her progressive NSIP and chronic hypoxemic respiratory failure: She has finished 2 cycles of Cytoxan.  Second cycle was a full dose.  After that she got neutropenic as reviewed on the labs.  During this time she developed a facial rash.  This was on a weekend and over the phone we treated her with doxycycline.  It is largely improved.  Tomorrow she has an appointment with her dermatologist.  I do not know who it is.  Overall she is feeling stable in terms of her IPF.  [facility].  In fact [place] to [place] is even better.  She attributes this to both nintedanib and Cytoxan.  She is using 4 L of oxygen at rest and 8 L with exertion.  Her symptom score is documented below with shortness of breath at 21-22.  She always has a lot of fatigue.  In terms of her medicine tolerance particularly with nintedanib she had intermittent nausea and vomiting particularly after eating certain types of foods.  She is managing this with Zofran.  Symptom score is documented below.  Her most recent cycle of Cytoxan was March 06, 2018.  Her next Cytoxan is April 03, 2018.  I discussed with Dr. MohJulien Nordmanncologist was a lot of experience with Cytoxan.  He said is okay to go with full dose if I felt so.  Patient herself is very keen on getting a full dose of Cytoxan.   She did not have neutropenic fever although she got neutropenic with the second cycle.     ROS  OV 04/26/2018  Subjective:  Patient  ID: Tasha Ballard, female , DOB: [birth year] , age 48 y.o. , MRN: [id number] , ADDRESS: [address] [place] [address]   04/26/2018 -   Chief Complaint  Patient presents with   Follow-up    Pt states she currently is not feeling too well and believes she might have sinus infection. Pt has had complaints of fatigue, has head congestion, pain in left ear going down to her neck and also has had a sore throat.     #Morbid Obesity - needs weight loss for transplant - sees Dr Janeal Holmes since dec 2019 #PRogressive NSIP #Chronic hypoxemic resp failure - 4L at rest, 8L with exertion # High risk treatment regimen  -CellCept 2013 through 2019  -Chronic prednisone 2013 -currently taking -Bactrim prophylaxis 2013 --currently taking  - Cytoxan Oral  25m per day 12/23/17 and increaed to 1054mon 12/23/17.On11/19/19. increase to 12563mer day   - Cytoxan IV : And switched to IV cytoxan Q4 week cycle   - 1st cycle 02/03/18 (s/p portocath 01/26/18)  = 2nd cycle 03/06/2018  - 3rd cycle  -  04/03/2018  - 4th cycle - 05/01/2018   - Ofev 01/23/18 (approximnate)  - Lung transplant needed: Obesity Barrier :  Weight 231#  11/22/17  -> 221# 01/17/2018., -> 222# on 03/02/2018 -> 220# on 03/30/2018 -> 223# on 04/26/2018    HPI ChrPAMELYN BANCROFT 15o. - presents for above issues. Here with ? Mom.Since last visit had blood woirk last week and wbc nadir. Had associatd "usual fatigue of low wbc". But then father in law died and she was busy so never recovered from fatigue. Has new wheeze, sinus congestion and increased cruds. Feels she has sinusitis. Wants abx before cycle #4 cytoxan next week. She is worried about COVID-19 ans asked for advice. Otherwise well. Symptom score below      OV 10/20/2018  Subjective:  Patient ID: ChrDina Richemale , DOB: [birth year]age 67 20.o. , MRN: [id number]: [id number] Obesity - needs weight loss for transplant - sees Dr CarJaneal Holmesnce dec 2019 #PRogressive NSIP #Chronic hypoxemic resp failure - 4L at rest, 8L with exertion # High risk treatment regimen  -CellCept 2013 through 2019  -Chronic prednisone 2013 -currently taking -Bactrim prophylaxis 2013 --currently taking  - Cytoxan Oral  72m66mr day 12/23/17 and increaed to 100mg30m11/1/19.On11/19/19. increase to 125mg 82mday   - Cytoxan IV : And switched to IV cytoxan Q4 week cycle   - 1st cycle 02/03/18 (s/p portocath 01/26/18)  = 2nd cycle 03/06/2018  - 3rd cycle  -  04/03/2018  - 4th cycle - 05/01/2018 - Cytoxan oral (due to pandemic covid-19 and need to avoid infusion visits) - spring 2020   - Ofev 01/23/18 (approximnate)  - Lung transplant needed: Obesity Barrier :  Weight 231#  11/22/17  -> 221# 01/17/2018., -> 222# on 03/02/2018 -> 220# on 03/30/2018 -> 223# on 04/26/2018 -> 224# on 10/20/2018   10/20/2018 -   Chief Complaint  Patient presents with   ILD (interstitial lung disease) (HCC)  [place] ChristCHASTELYN ATHENSo53-presents for follow-up of the above issues.  She reports that over summer 2020 her husband nearly got exposed to someone with COVID this was in the outdoor setting.  She never ended up getting COVID.  On October 22, 2018 she tested negative for COVID. On October 04, 2018 she  had a virtual visit with nurse practitioner for sinus complaints.  She took a 10-day course of Augmentin.  She was taking this along with Bactrim despite instructions to hold Bactrim.  This gave her immense diarrhea.  She is also reporting that the diarrhea has now improved after finishing the Augmentin course.  However she is having fatigue for the last 1 month.  She feels this because of the oral Cytoxan.  She wants to switch to IV Cytoxan.  She is in need of a flu shot and is willing to have that.  Her Port-A-Cath site is fine.   Currently she is on prednisone, oral Cytoxan, Bactrim prophylaxis and nintedanib.  She maintains herself on 6 L.  Her symptom score and oxygen needs continue to be stable.  Obesity continues to be a barrier for transplant.  In terms of her ILD her last PFT was in February 2020 and CT scan was in  Nov 2019.    SYMPTOM SCALE - ILD 03/30/2018  04/26/2018  10/20/2018   O2 use  4 L at rest, 8 L with exertion Same but prefers 6L at all times 6L   Shortness of Breath 0 -> 5 scale with 5 being worst (score 6 If unable to do)    At rest 0 1 0  Simple tasks - showers, clothes change, eating, shaving 1 3.5 3.5  Household (dishes, doing bed, laundry) 2.5 4.5 3.5  Shopping 3 4.5 3.5  Walking level at own pace 3 3.5 3.5  Walking keeping up with others of same age _0 Walking up Stairs 4 5 4.5  Walking up Hill 4 5 4.5  Total (40 - 48) Dyspnea Score 21.5 32 28  How bad is your cough? x 2.5 x  How bad is your fatigue 5 ("very, lol" 4 Extreme 5+    ROS - per HPI     has a past medical history of Aneurysm ([place]), Back pain, Diabetes mellitus without complication ([place]), H/O blood clots, Hypertension, Joint pain, Pulmonary fibrosis (HCC), SOB (shortness of breath), Swelling, Thyroid disease, and Vitamin D deficiency.   reports that she has never smoked. She has never used smokeless tobacco.  Past Surgical History:  Procedure Laterality Date   ABLATION     IR IMAGING GUIDED PORT INSERTION  01/26/2018   KNEE SURGERY     LUNG BIOPSY      Allergies  Allergen Reactions   Macrobid [Nitrofurantoin Macrocrystal] Nausea And Vomiting   Adhesive [Tape] Rash    Immunization History  Administered Date(s) Administered   Influenza,inj,Quad PF,6+ Mos 12/24/2014, 02/05/2016, 01/07/2017, 11/22/2017   Influenza-Unspecified 12/24/2014, 02/05/2016, 01/07/2017, 11/22/2017   Pneumococcal Conjugate-13 07/20/2017   Pneumococcal Polysaccharide-23 06/09/2012   Tdap 02/19/2011    Family History   Problem Relation Age of Onset   Heart failure Mother    Heart disease Mother    Rheumatologic disease Father      Current Outpatient Medications:    benzonatate (TESSALON) 200 MG capsule, Take 1 capsule (200 mg total) by mouth 3 (three) times daily as needed for cough., Disp: 30 capsule, Rfl: 1   carvedilol (COREG) 6.25 MG tablet, Take 6.25 mg by mouth 2 (two) times daily with a meal., Disp: , Rfl:    clindamycin (CLEOCIN T) 1 % lotion, , Disp: , Rfl:    cyclophosphamide (CYTOXAN) 50 MG capsule, Take 2 capsules (100 mg total) by mouth daily. Give on an empty stomach 1 hour before or 2 hours after meals., Disp:  60 capsule, Rfl: 2   furosemide (LASIX) 40 MG tablet, Take 40 mg by mouth See admin instructions. Take 1 tablet (40 mg) daily, may repeat dose if needed for fluid retention., Disp: , Rfl:    metFORMIN (GLUCOPHAGE) 500 MG tablet, Take 500 mg by mouth 2 (two) times daily. , Disp: , Rfl:    montelukast (SINGULAIR) 10 MG tablet, Take 10 mg by mouth daily after supper., Disp: , Rfl:    OFEV 150 MG CAPS, Take 150 mg by mouth 2 (two) times daily., Disp: , Rfl: 11   ondansetron (ZOFRAN) 8 MG tablet, TAKE 1 TABLET BY MOUTH EVERY 8 HOURS AS NEEDED FOR NAUSEA & VOMITING, Disp: 45 tablet, Rfl: 0   potassium chloride (K-DUR) 10 MEQ tablet, Take 2 tablets (20 mEq total) by mouth 2 (two) times daily., Disp: 120 tablet, Rfl: 2   predniSONE (DELTASONE) 10 MG tablet, Take 10-31m as needed for SOB (Patient taking differently: Take 10 mg by mouth daily with breakfast. ), Disp: 30 tablet, Rfl: 3   PROAIR HFA 108 (90 Base) MCG/ACT inhaler, Inhale 2 puffs into the lungs every 6 (six) hours as needed for wheezing or shortness of breath., Disp: , Rfl: 4   spironolactone (ALDACTONE) 100 MG tablet, Take 100 mg by mouth daily., Disp: , Rfl:    sulfamethoxazole-trimethoprim (BACTRIM) 400-80 MG tablet, Once a day while on prednisone (Patient taking differently: Take 1 tablet by mouth every evening.  ), Disp: 90 tablet, Rfl: 1   thyroid (ARMOUR) 90 MG tablet, Take 90 mg by mouth daily before breakfast. , Disp: , Rfl:    Vitamin D, Ergocalciferol, (DRISDOL) 1.25 MG (50000 UT) CAPS capsule, TAKE 1 CAPSULE BY MOUTH ONCE WEEKLY, Disp: 3 capsule, Rfl: 0   gabapentin (NEURONTIN) 100 MG capsule, Take 1 capsule by mouth as needed., Disp: , Rfl:       Objective:   Vitals:   10/20/18 1006  BP: 128/74  Pulse: 96  Temp: 98.2 F (36.8 C)  SpO2: 98%  Weight: 224 lb 9.6 oz (101.9 kg)  Height: _0  (1.575 m)    Estimated body mass index is 41.08 kg/m as calculated from the following:   Height as of this encounter: _1  (1.575 m).   Weight as of this encounter: 224 lb 9.6 oz (101.9 kg).  _2 @  [facility]  10/20/18 1006  Weight: 224 lb 9.6 oz (101.9 kg)     Physical Exam  General Appearance:    Alert, cooperative, no distress, appears stated age - yes , Deconditioned looking - no , OBESE  - yes, Sitting on Wheelchair -  no  Head:    Normocephalic, without obvious abnormality, atraumatic  Eyes:    PERRL, conjunctiva/corneas clear,  Ears:    Normal TM's and external ear canals, both ears  Nose:   Nares normal, septum midline, mucosa normal, no drainage    or sinus tenderness. OXYGEN ON  - yes . Patient is @ 6L   Throat:   Lips, mucosa, and tongue normal; teeth and gums normal. Cyanosis on lips - no  Neck:   Supple, symmetrical, trachea midline, no adenopathy;    thyroid:  no enlargement/tenderness/nodules; no carotid   bruit or JVD  Back:     Symmetric, no curvature, ROM normal, no CVA tenderness  Lungs:     Distress - no , Wheeze no, Barrell Chest - no, Purse lip breathing - no, Crackles - yes at base   Chest Wall:  No tenderness or deformity.    Heart:    Regular rate and rhythm, S1 and S2 normal, no rub   or gallop, Murmur - no  Breast Exam:    NOT DONE  Abdomen:     Soft, non-tender, bowel sounds active all four quadrants,    no masses, no organomegaly.  Visceral obesity - yes  Genitalia:   NOT DONE  Rectal:   NOT DONE  Extremities:   Extremities - normal, Has Cane - no, Clubbing - no, Edema - no  Pulses:   2+ and symmetric all extremities  Skin:   Stigmata of Connective Tissue Disease - no  Lymph nodes:   Cervical, supraclavicular, and axillary nodes normal  Psychiatric:  Neurologic:   Pleasant - yes, Anxious - no, Flat affect - no  CAm-ICU - neg, Alert and Oriented x 3 - yes, Moves all 4s - yes, Speech - normal, Cognition - intact           Assessment:       ICD-10-CM   1. Chronic respiratory failure with hypoxia (HCC)  J96.11   2. ILD (interstitial lung disease) ([place])  J84.9   3. 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 High risk medication use  Z79.899   5. Drug-induced nausea and vomiting  R11.2    T50.905A   6. Therapeutic drug monitoring  Z51.81        Plan:     Patient Instructions  Chronic respiratory failure with hypoxia (HCC) ILD (interstitial lung disease) (HCC) NSIP (nonspecific interstitial pneumonia) ([place])  - clinically stable - flu shot 10/20/2018  -restage disease by doing   - spirometry and dlco next 1-2 months  - HRCT next 1-2 months   - Continue low risk covid-19 activities  Drug-induced nausea and vomiting and diarrhea and fatigue Therapeutic drug monitoring High risk medication use  - diarrhea and fatigue could have been due to augmentin, ofev, and bactrim - taking all at same time  - glad you are better from diarrhea   - need to investigate fatigue and could be due to po cytoxam   - I agree we need to look at reverting back to IV cytoxan   PLAN  -Check cbc, bmet, lft, mag, phos and urine analysis = Meet with office pharmacist Amber   - discuss change to IV cytoxan and timing of this - depends on lab resuls and also infusion center availability  - discuss getting shingrix vaccine  - go over medication list with her   Followup  - 1- 2 months but after       >  50% of this > 40 min visit spent in face to face counseling or/and coordination of care - by this undersigned MD - Dr Brand Males. This includes one or more of the following documented above: discussion of test results, diagnostic or treatment recommendations, prognosis, risks and benefits of management options, instructions, education, compliance or risk-factor reduction   SIGNATURE    Dr. Brand Males, M.D., F.C.C.P,  Pulmonary and Critical Care Medicine Staff Physician, [place] Director - Interstitial Lung Disease  Program  Pulmonary [place] at [place], [place], [address]  Pager: [phone], If no answer or between  15:00h - 7:00h: call [phone] Telephone: [phone]  10:54 AM 10/20/2018

## 2018-10-20 NOTE — Telephone Encounter (Signed)
Order has been placed for pft.  Order states to do at [facility] in 1-2 months preferably same day as appt with MR.  I just talked to him and he states pft should be done here and he also stated he will be opening up his schedule for October soon.

## 2018-10-23 NOTE — Telephone Encounter (Signed)
Appointments not scheduled as of 10/23/18.  Will leave encounter open.

## 2018-10-25 NOTE — Telephone Encounter (Signed)
Routing message to [place] per PFT scheduling protocol. Pt has a PFT ordered that needs to be scheduled in approx. 1-2 months, preferably same day as appt with MR. MR prefers PFT to be performed at the [facility]. Please advise, thank you.

## 2018-10-31 MED FILL — CYCLOPHOSPHAMIDE 50 MG CAPS: 50 | 30 days supply | Qty: 60 | Fill #1

## 2018-11-02 NOTE — Telephone Encounter (Signed)
L/m on pt vm to call back to schedule pft -pr

## 2018-11-03 ENCOUNTER — Telehealth: Payer: Self-pay | Admitting: Pharmacist

## 2018-11-03 NOTE — Telephone Encounter (Signed)
-----  Message from [employer], [facility] sent at 10/20/2018 12:20 PM EDT ----- Regarding: Call Back IV Cytoxan and Shingrix vaccine.

## 2018-11-03 NOTE — Telephone Encounter (Signed)
Returned patient call.  No answer.  Left message.    Following up about switching to IV cytoxan from oral and Shingrix vaccine.  She is interested in switching from oral Cytoxan to IV infusion due to increased N/V with the addition of Ofev.  She wanted to receive the infusion at [facility]. Unfortunately only patients that see providers at [facility] can receive infusions at the cancer center.  She can receive infusions from [facility] short stay/medical day.  This will require a PA and orders sent to [facility] short stay.  Clinic staff would need to initate PA and coordinate orders/schedule with [facility] short stay if patient would like to proceed.  Patient had questions about receiving the Shingrix vaccine.  She is eligible based on age but concerned about getting vaccine due to being on immunosuppressive therapy.  However she has had a Shingles outbreak in the past.  Shingrix is an inactivated vaccine so theoretically should be safe in immunocompromised patients but that patient population was not included in the RCT for the vaccine.  There is a risk for reactivation of Shingles after receiving Shingrix vaccine but occurs in about 3% of patients. Will defer to Dr. [name] on recommendation for Shingrix vaccine.  Please let us know if we can be of further assistance.  Mariella Saa, PharmD, [place], [place] Clinical Specialty Pharmacist [phone]  11/03/2018 3:11 PM

## 2018-11-03 NOTE — Telephone Encounter (Signed)
Patient scheduled on 9/28 for pft - I will call pt to schedule appt with MR -pr

## 2018-11-05 NOTE — Telephone Encounter (Signed)
Hi Amber  She was getting monthly infusions at [facility] long short stay and this was changed to oral at outbreak of pandemic. Please work with my CMA/RN (maybe Daneil Dan who has more experience on this given fact Raquel Sarna Pinion is on FMLA) to facilitate this .  [name] Parrett our NP has helped in past with orders and ? Prior Ashley Royalty    Dr. Brand Males, M.D., F.C.C.P,  Pulmonary and Critical Care Medicine Staff Physician, [place] Director - Interstitial Lung Disease  Program  Pulmonary [place] at [place], [place], [address]  Pager: [phone], If no answer or between  15:00h - 7:00h: call [phone] Telephone: [phone]  8:06 AM 11/05/2018  g

## 2018-11-07 NOTE — Telephone Encounter (Signed)
Called the patient to ask if there are any days that will not work for her to start injections again. She stated no.  However she does want morning appointments 7:00 or 8:00 if possible. Patient stated last time (infusion was 05/01/18) she was able to stop taking the medication one day before, wants to know if that remains the same.  Advised the patient I will ask Dr. [name] and also get back to her regarding appointment date.  Dr. [name], need to know how far in advance does she need to stop taking her medication before infusion?

## 2018-11-07 NOTE — Telephone Encounter (Signed)
Called [facility] short stay and was advised that we will need to speak with [facility], however, Karena Addison has left for the day. Will need to call back tomorrow at [phone] before 1pm to speak with [facility] to get pt restarted on her Cytoxan infusion. I have spoken with Hinton Dyer and she is aware.

## 2018-11-08 NOTE — Telephone Encounter (Signed)
Forwarded back to you at your request.

## 2018-11-09 ENCOUNTER — Telehealth: Payer: Self-pay | Admitting: Internal Medicine

## 2018-11-09 ENCOUNTER — Other Ambulatory Visit: Payer: Self-pay | Admitting: Internal Medicine

## 2018-11-09 NOTE — Telephone Encounter (Signed)
See telephone note dated 11/09/18 regarding future appointment on 11/24/18 with Dr. [name] and resumption of Iv medication. Nothing further needed.

## 2018-11-09 NOTE — Telephone Encounter (Signed)
TP and MR have been working on this closely together  Oral Cytoxan to be held for 48hrs prior to IV Cytoxan  Called spoke with [place] at [employer] - unable to schedule patient for next week until we know if PA approval/dates are still active - but as of now, there are openings on the schedule.  PharmTech Frederik Schmidt is helping with whether or not the PA dates are still active.  Called spoke with patient (whom has already spoken with TP this morning).  She is scheduled for HRCT and PFT on 9.28.2020.  Have scheduled patient to see MR on that Friday 10.2.2020 @ 1000 to follow up on her Cytoxan infusion, HRCT and PFT results and will need labs post Cytoxan at that visit as well (ov scheduled for 30 mins).  Patient is aware will call her back with PA and scheduling info once we have it.  Will route to myself to ensure follow up.

## 2018-11-10 ENCOUNTER — Encounter: Payer: Self-pay | Admitting: *Deleted

## 2018-11-10 NOTE — Telephone Encounter (Signed)
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 This is life threatening. She is immune suppressed and is on  > 6L oxygen with class 3-4 dyspnea. She will benefit from disability. Either myself or [name] Parrett can sign. I am in hospital next week but  Return 11/16/2018

## 2018-11-10 NOTE — Telephone Encounter (Signed)
Spoke with Louie Casa PharmD w/ [facility] inpatient pharmacy who reported will need to call pt's insurance and ask if the cytoxan will require pre-authorization.  [facility] Short Stay does need this in order to schedule.  [employer] and spoke with rep Cassandra S >> pre-authorization NOT needed for CPT code [phone] (CPT code provided by pharmacy tech Frederik Schmidt).  Cytoxan orders placed on order sheet provided by Louie Casa and reviewed/signed by TP.  Order sheet and Cytoxan protocol faxed to [place] @ [place] Stay @ [phone].  Patient is aware of the above and that she will be contacted next week for scheduling.  Nothing further needed at this time; will sign off.

## 2018-11-10 NOTE — Telephone Encounter (Signed)
Patient wants a letter so she can apply for disability. Patient emailed saying the letter needs to "stating my condition and that I am unable to hold a job".   Dr. [name] please advise

## 2018-11-13 ENCOUNTER — Telehealth: Payer: Self-pay

## 2018-11-13 NOTE — Telephone Encounter (Signed)
We will only hold Cytoxan for 48hr prior to infusion .   Will are waiting on Short stay to get back to [name] .  Tasha Ballard , have we heard from them yet. As soon as we do will call her.  Fine to hold cytoxan for now until we find date . Will call her soon .

## 2018-11-13 NOTE — Telephone Encounter (Signed)
Called Short Stay @ [phone] and spoke with [facility] who reported she was unable to get patient scheduled for her Cytoxan infusion until 9.30.2020.  Dee did not discuss with patient when her last dose of oral Cytoxan needs to be and reported this information needs to come from this office.  Called spoke with patient who reported that she has already called and spoken with [facility] @ Short Stay because patient had really wanted to get her 1st infusion tomorrow 9/22 and she has already held her oral cytoxan yesterday and today in the hopes that she would be able to.  Unfortunately short stay's schedule is already full.  Patient was advised to call back tomorrow to check for cancellation.  Patient will hold her oral cytoxan tomorrow as well in case and resume on Thursday if she is unable to receive infusion this week.  Patient stated that she and TP discussed last week that pt will hold her cytoxan for 2 full days in addition to the date of infusion - ergo patient will hold her oral cytoxan beginning Sunday 9/27.   Will route message to myself to see if patient is able to receive infusion prior to 9/30.

## 2018-11-13 NOTE — Telephone Encounter (Signed)
Janett Billow and [name] please see patient's below email.  It seems you ladies have already been working on this with her. Thank you!    "I don't have Jessica's direct number or last name. I went ahead an stopped the chemo this morning in case they can schedule chemo for Tuesday. I just needed to let her know so she could schedule for Tuesday if there is an opening. I know you wanted 5 days anyway, so I figured it might be best to stop today. Hope you have a fabulous weekend! [name]"

## 2018-11-16 NOTE — Telephone Encounter (Signed)
Patient has not been able to schedule her Cytoxan sooner.  Will sign off.

## 2018-11-17 ENCOUNTER — Other Ambulatory Visit (HOSPITAL_COMMUNITY)
Admission: RE | Admit: 2018-11-17 | Discharge: 2018-11-17 | Disposition: A | Discharge status: Home / Self Care | Payer: BC Managed Care – PPO | Source: Ambulatory Visit | Attending: Internal Medicine | Admitting: Internal Medicine

## 2018-11-17 DIAGNOSIS — Z20828 Contact with and (suspected) exposure to other viral communicable diseases: Secondary | ICD-10-CM | POA: Diagnosis not present

## 2018-11-17 DIAGNOSIS — Z01812 Encounter for preprocedural laboratory examination: Secondary | ICD-10-CM | POA: Diagnosis present

## 2018-11-18 LAB — NOVEL CORONAVIRUS, NAA (HOSP ORDER, SEND-OUT TO REF LAB; TAT 18-24 HRS): SARS-CoV-2, NAA: NOT DETECTED

## 2018-11-20 ENCOUNTER — Other Ambulatory Visit: Payer: Self-pay

## 2018-11-20 ENCOUNTER — Ambulatory Visit: Payer: BC Managed Care – PPO

## 2018-11-20 ENCOUNTER — Ambulatory Visit (INDEPENDENT_AMBULATORY_CARE_PROVIDER_SITE_OTHER)
Admission: RE | Admit: 2018-11-20 | Discharge: 2018-11-20 | Disposition: A | Discharge status: Home / Self Care | Payer: BC Managed Care – PPO | Source: Ambulatory Visit | Attending: Internal Medicine | Admitting: Internal Medicine

## 2018-11-20 DIAGNOSIS — J8489 Other specified interstitial pulmonary diseases: Secondary | ICD-10-CM

## 2018-11-20 DIAGNOSIS — J849 Interstitial pulmonary disease, unspecified: Secondary | ICD-10-CM | POA: Diagnosis not present

## 2018-11-20 DIAGNOSIS — J9611 Chronic respiratory failure with hypoxia: Secondary | ICD-10-CM | POA: Diagnosis not present

## 2018-11-20 LAB — PULMONARY FUNCTION TEST
DL/VA % pred: 93 %
DL/VA: 4.07 ml/min/mmHg/L
DLCO cor % pred: 37 %
DLCO cor: 7.45 ml/min/mmHg
DLCO unc % pred: 37 %
DLCO unc: 7.29 ml/min/mmHg
FEF 25-75 Pre: 1.64 L/sec
FEF2575-%Pred-Pre: 62 %
FEV1-%Pred-Pre: 40 %
FEV1-Pre: 1.06 L
FEV1FVC-%Pred-Pre: 114 %
FEV6-%Pred-Pre: 36 %
FEV6-Pre: 1.16 L
FEV6FVC-%Pred-Pre: 102 %
FVC-%Pred-Pre: 35 %
FVC-Pre: 1.16 L
Pre FEV1/FVC ratio: 91 %
Pre FEV6/FVC Ratio: 100 %

## 2018-11-20 MED ORDER — PREDNISONE 10 MG PO TABS: ORAL_TABLET | ORAL | 3 refills | Status: DC

## 2018-11-22 ENCOUNTER — Encounter (HOSPITAL_COMMUNITY): Payer: Self-pay

## 2018-11-22 ENCOUNTER — Encounter (HOSPITAL_COMMUNITY)
Admission: RE | Admit: 2018-11-22 | Discharge: 2018-11-22 | Disposition: A | Discharge status: Home / Self Care | Payer: BC Managed Care – PPO | Source: Ambulatory Visit | Attending: Internal Medicine | Admitting: Internal Medicine

## 2018-11-22 ENCOUNTER — Other Ambulatory Visit: Payer: Self-pay

## 2018-11-22 DIAGNOSIS — J84112 Idiopathic pulmonary fibrosis: Secondary | ICD-10-CM | POA: Diagnosis present

## 2018-11-22 LAB — COMPREHENSIVE METABOLIC PANEL
ALT: 28 U/L (ref 0–44)
AST: 23 U/L (ref 15–41)
Albumin: 3.6 g/dL (ref 3.5–5.0)
Alkaline Phosphatase: 53 U/L (ref 38–126)
Anion gap: 11 (ref 5–15)
BUN: 11 mg/dL (ref 6–20)
CO2: 31 mmol/L (ref 22–32)
Calcium: 9.2 mg/dL (ref 8.9–10.3)
Chloride: 92 mmol/L — ABNORMAL LOW (ref 98–111)
Creatinine, Ser: 0.8 mg/dL (ref 0.44–1.00)
GFR calc Af Amer: >60 mL/min (ref >60)
GFR calc non Af Amer: >60 mL/min (ref >60)
Glucose, Bld: 168 mg/dL — ABNORMAL HIGH (ref 70–99)
Potassium: 3.3 mmol/L — ABNORMAL LOW (ref 3.5–5.1)
Sodium: 134 mmol/L — ABNORMAL LOW (ref 135–145)
Total Bilirubin: 0.6 mg/dL (ref 0.3–1.2)
Total Protein: 6.5 g/dL (ref 6.5–8.1)

## 2018-11-22 LAB — CBC
HCT: 37.6 % (ref 36.0–46.0)
Hemoglobin: 11.8 g/dL — ABNORMAL LOW (ref 12.0–15.0)
MCH: 32 pg (ref 26.0–34.0)
MCHC: 31.4 g/dL (ref 30.0–36.0)
MCV: 101.9 fL — ABNORMAL HIGH (ref 80.0–100.0)
Platelets: 172 10*3/uL (ref 150–400)
RBC: 3.69 MIL/uL — ABNORMAL LOW (ref 3.87–5.11)
RDW: 13.9 % (ref 11.5–15.5)
WBC: 5.9 10*3/uL (ref 4.0–10.5)
nRBC: 0 % (ref 0.0–0.2)

## 2018-11-22 LAB — MAGNESIUM: Magnesium: 1.8 mg/dL (ref 1.7–2.4)

## 2018-11-22 LAB — URINALYSIS, ROUTINE W REFLEX MICROSCOPIC
Bilirubin Urine: NEGATIVE
Glucose, UA: NEGATIVE mg/dL
Hgb urine dipstick: NEGATIVE
Ketones, ur: NEGATIVE mg/dL
Leukocytes,Ua: NEGATIVE
Nitrite: NEGATIVE
Protein, ur: NEGATIVE mg/dL
Specific Gravity, Urine: 1.004 — ABNORMAL LOW (ref 1.005–1.030)
pH: 7 (ref 5.0–8.0)

## 2018-11-22 LAB — PHOSPHORUS: Phosphorus: 3.7 mg/dL (ref 2.5–4.6)

## 2018-11-22 MED ORDER — HEPARIN SOD (PORK) LOCK FLUSH 100 UNIT/ML IV SOLN
500.0000 [IU] | Freq: Once | INTRAVENOUS | Status: AC
  Administered 2018-11-22: 14:00:00 500 [IU] via INTRAVENOUS
  Filled 2018-11-22: qty 5

## 2018-11-22 MED ORDER — POTASSIUM CHLORIDE 20 MEQ PO PACK
40.0000 meq | PACK | Freq: Once | ORAL | Status: DC
  Filled 2018-11-22: qty 2

## 2018-11-22 MED ORDER — HYDROCORTISONE NA SUCCINATE PF 100 MG IJ SOLR
100.0000 mg | Freq: Once | INTRAMUSCULAR | Status: AC
  Administered 2018-11-22: 100 mg via INTRAVENOUS
  Filled 2018-11-22: qty 2

## 2018-11-22 MED ORDER — DEXTROSE-NACL 5-0.45 % IV SOLN
Freq: Once | INTRAVENOUS | Status: AC
  Administered 2018-11-22: 12:00:00 via INTRAVENOUS

## 2018-11-22 MED ORDER — SODIUM CHLORIDE 0.9 % IV SOLN: 250.0000 mg | Freq: Once | INTRAVENOUS | Status: DC

## 2018-11-22 MED ORDER — POTASSIUM CHLORIDE CRYS ER 20 MEQ PO TBCR
40.0000 meq | EXTENDED_RELEASE_TABLET | Freq: Once | ORAL | Status: AC
  Administered 2018-11-22: 40 meq via ORAL
  Filled 2018-11-22: qty 2

## 2018-11-22 MED ORDER — DEXTROSE-NACL 5-0.45 % IV SOLN
Freq: Once | INTRAVENOUS | Status: AC
  Administered 2018-11-22: 08:00:00 via INTRAVENOUS

## 2018-11-22 MED ORDER — MESNA 100 MG/ML IV SOLN
250.0000 mg | Freq: Once | INTRAVENOUS | Status: AC
  Administered 2018-11-22: 13:00:00 250 mg via INTRAVENOUS
  Filled 2018-11-22: qty 2.5

## 2018-11-22 MED ORDER — ONDANSETRON HCL 8 MG PO TABS
8.0000 mg | ORAL_TABLET | Freq: Once | ORAL | Status: AC
  Administered 2018-11-22: 8 mg via ORAL
  Filled 2018-11-22: qty 1

## 2018-11-22 MED ORDER — CYCLOPHOSPHAMIDE CHEMO INJECTION 1 GM: 1000.0000 mg | Freq: Once | INTRAMUSCULAR | Status: DC

## 2018-11-22 MED ORDER — DEXTROSE 5 % IV SOLN
1000.0000 mg | Freq: Once | INTRAVENOUS | Status: AC
  Administered 2018-11-22: 11:00:00 1000 mg via INTRAVENOUS
  Filled 2018-11-22: qty 50

## 2018-11-22 MED ORDER — MESNA 100 MG/ML IV SOLN
250.0000 mg | Freq: Once | INTRAVENOUS | Status: AC
  Administered 2018-11-22: 10:00:00 250 mg via INTRAVENOUS
  Filled 2018-11-22: qty 2.5

## 2018-11-22 MED ORDER — ONDANSETRON HCL 8 MG PO TABS
8.0000 mg | ORAL_TABLET | ORAL | Status: DC
  Filled 2018-11-22: qty 1

## 2018-11-22 NOTE — Progress Notes (Signed)
Lab results called to Dr Lynford Citizen.  Magnesium and phosphorus levels and PO potassium added to orders.  Proceed with infusion per Dr Lynford Citizen.

## 2018-11-22 NOTE — Discharge Instructions (Signed)
Cyclophosphamide injection What is this medicine? CYCLOPHOSPHAMIDE (sye kloe FOSS fa mide) is a chemotherapy drug. It slows the growth of cancer cells. This medicine is used to treat many types of cancer like lymphoma, myeloma, leukemia, breast cancer, and ovarian cancer, to name a few. This medicine may be used for other purposes; ask your health care provider or pharmacist if you have questions. COMMON BRAND NAME(S): Cytoxan, Neosar What should I tell my health care provider before I take this medicine? They need to know if you have any of these conditions:  blood disorders  history of other chemotherapy  infection  kidney disease  liver disease  recent or ongoing radiation therapy  tumors in the bone marrow  an unusual or allergic reaction to cyclophosphamide, other chemotherapy, other medicines, foods, dyes, or preservatives  pregnant or trying to get pregnant  breast-feeding How should I use this medicine? This drug is usually given as an injection into a vein or muscle or by infusion into a vein. It is administered in a hospital or clinic by a specially trained health care professional. Talk to your pediatrician regarding the use of this medicine in children. Special care may be needed. Overdosage: If you think you have taken too much of this medicine contact a poison control center or emergency room at once. NOTE: This medicine is only for you. Do not share this medicine with others. What if I miss a dose? It is important not to miss your dose. Call your doctor or health care professional if you are unable to keep an appointment. What may interact with this medicine? This medicine may interact with the following medications:  amiodarone  amphotericin B  azathioprine  certain antiviral medicines for HIV or AIDS such as protease inhibitors (e.g., indinavir, ritonavir) and zidovudine  certain blood pressure medications such as benazepril, captopril, enalapril,  fosinopril, lisinopril, moexipril, monopril, perindopril, quinapril, ramipril, trandolapril  certain cancer medications such as anthracyclines (e.g., daunorubicin, doxorubicin), busulfan, cytarabine, paclitaxel, pentostatin, tamoxifen, trastuzumab  certain diuretics such as chlorothiazide, chlorthalidone, hydrochlorothiazide, indapamide, metolazone  certain medicines that treat or prevent blood clots like warfarin  certain muscle relaxants such as succinylcholine  cyclosporine  etanercept  indomethacin  medicines to increase blood counts like filgrastim, pegfilgrastim, sargramostim  medicines used as general anesthesia  metronidazole  natalizumab This list may not describe all possible interactions. Give your health care provider a list of all the medicines, herbs, non-prescription drugs, or dietary supplements you use. Also tell them if you smoke, drink alcohol, or use illegal drugs. Some items may interact with your medicine. What should I watch for while using this medicine? Visit your doctor for checks on your progress. This drug may make you feel generally unwell. This is not uncommon, as chemotherapy can affect healthy cells as well as cancer cells. Report any side effects. Continue your course of treatment even though you feel ill unless your doctor tells you to stop. Drink water or other fluids as directed. Urinate often, even at night. In some cases, you may be given additional medicines to help with side effects. Follow all directions for their use. Call your doctor or health care professional for advice if you get a fever, chills or sore throat, or other symptoms of a cold or flu. Do not treat yourself. This drug decreases your body's ability to fight infections. Try to avoid being around people who are sick. This medicine may increase your risk to bruise or bleed. Call your doctor or health care professional  if you notice any unusual bleeding. Be careful brushing and  flossing your teeth or using a toothpick because you may get an infection or bleed more easily. If you have any dental work done, tell your dentist you are receiving this medicine. You may get drowsy or dizzy. Do not drive, use machinery, or do anything that needs mental alertness until you know how this medicine affects you. Use contraception at all times for intercourse. Do not become pregnant while taking this medicine or for 1 year after stopping it. Women should inform their doctor if they wish to become pregnant or think they might be pregnant. Men should not father a child while taking this medicine and for 4 months after stopping it. There is a potential for serious side effects to an unborn child. Talk to your health care professional or pharmacist for more information. Do not breast-feed an infant while taking this medicine. This medicine may interfere with the ability to have a child. This medicine has caused ovarian failure in some women. This medicine has caused reduced sperm counts in some men. You should talk with your doctor or health care professional if you are concerned about your fertility. If you are going to have surgery, tell your doctor or health care professional that you have taken this medicine. What side effects may I notice from receiving this medicine? Side effects that you should report to your doctor or health care professional as soon as possible:  allergic reactions like skin rash, itching or hives, swelling of the face, lips, or tongue  low blood counts - this medicine may decrease the number of [place] blood cells, red blood cells and platelets. You may be at increased risk for infections and bleeding.  signs of infection - fever or chills, cough, sore throat, pain or difficulty passing urine  signs of decreased platelets or bleeding - bruising, pinpoint red spots on the skin, black, tarry stools, blood in the urine  signs of decreased red blood cells - unusually  weak or tired, fainting spells, lightheadedness  breathing problems  dark urine  dizziness  palpitations  swelling of the ankles, feet, hands  trouble passing urine or change in the amount of urine  weight gain  yellowing of the eyes or skin Side effects that usually do not require medical attention (report to your doctor or health care professional if they continue or are bothersome):  changes in nail or skin color  hair loss  missed menstrual periods  mouth sores  nausea, vomiting This list may not describe all possible side effects. Call your doctor for medical advice about side effects. You may report side effects to FDA at 1-800-FDA-1088. Where should I keep my medicine? This drug is given in a hospital or clinic and will not be stored at home. NOTE: This sheet is a summary. It may not cover all possible information. If you have questions about this medicine, talk to your doctor, pharmacist, or health care provider.  2020 Elsevier/Gold Standard (2011-12-24 16:22:58)    Implanted [facility] Guide An implanted port is a device that is placed under the skin. It is usually placed in the chest. The device can be used to give IV medicine, to take blood, or for dialysis. You may have an implanted port if:  You need IV medicine that would be irritating to the small veins in your hands or arms.  You need IV medicines, such as antibiotics, for a long period of time.  You need IV  nutrition for a long period of time.  You need dialysis. Having a port means that your health care provider will not need to use the veins in your arms for these procedures. You may have fewer limitations when using a port than you would if you used other types of long-term IVs, and you will likely be able to return to normal activities after your incision heals. An implanted port has two main parts:  Reservoir. The reservoir is the part where a needle is inserted to give medicines or draw blood.  The reservoir is round. After it is placed, it appears as a small, raised area under your skin.  Catheter. The catheter is a thin, flexible tube that connects the reservoir to a vein. Medicine that is inserted into the reservoir goes into the catheter and then into the vein. How is my port accessed? To access your port:  A numbing cream may be placed on the skin over the port site.  Your health care provider will put on a mask and sterile gloves.  The skin over your port will be cleaned carefully with a germ-killing soap and allowed to dry.  Your health care provider will gently pinch the port and insert a needle into it.  Your health care provider will check for a blood return to make sure the port is in the vein and is not clogged.  If your port needs to remain accessed to get medicine continuously (constant infusion), your health care provider will place a clear bandage (dressing) over the needle site. The dressing and needle will need to be changed every week, or as told by your health care provider. What is flushing? Flushing helps keep the port from getting clogged. Follow instructions from your health care provider about how and when to flush the port. Ports are usually flushed with saline solution or a medicine called heparin. The need for flushing will depend on how the port is used:  If the port is only used from time to time to give medicines or draw blood, the port may need to be flushed: ? Before and after medicines have been given. ? Before and after blood has been drawn. ? As part of routine maintenance. Flushing may be recommended every 4-6 weeks.  If a constant infusion is running, the port may not need to be flushed.  Throw away any syringes in a disposal container that is meant for sharp items (sharps container). You can buy a sharps container from a pharmacy, or you can make one by using an empty hard plastic bottle with a cover. How long will my port stay  implanted? The port can stay in for as long as your health care provider thinks it is needed. When it is time for the port to come out, a surgery will be done to remove it. The surgery will be similar to the procedure that was done to put the port in. Follow these instructions at home:   Flush your port as told by your health care provider.  If you need an infusion over several days, follow instructions from your health care provider about how to take care of your port site. Make sure you: ? Wash your hands with soap and water before you change your dressing. If soap and water are not available, use alcohol-based hand sanitizer. ? Change your dressing as told by your health care provider. ? Place any used dressings or infusion bags into a plastic bag. Throw that bag in the  trash. ? Keep the dressing that covers the needle clean and dry. Do not get it wet. ? Do not use scissors or sharp objects near the tube. ? Keep the tube clamped, unless it is being used.  Check your port site every day for signs of infection. Check for: ? Redness, swelling, or pain. ? Fluid or blood. ? Pus or a bad smell.  Protect the skin around the port site. ? Avoid wearing bra straps that rub or irritate the site. ? Protect the skin around your port from seat belts. Place a soft pad over your chest if needed.  Bathe or shower as told by your health care provider. The site may get wet as long as you are not actively receiving an infusion.  Return to your normal activities as told by your health care provider. Ask your health care provider what activities are safe for you.  Carry a medical alert card or wear a medical alert bracelet at all times. This will let health care providers know that you have an implanted port in case of an emergency. Get help right away if:  You have redness, swelling, or pain at the port site.  You have fluid or blood coming from your port site.  You have pus or a bad smell coming  from the port site.  You have a fever. Summary  Implanted ports are usually placed in the chest for long-term IV access.  Follow instructions from your health care provider about flushing the port and changing bandages (dressings).  Take care of the area around your port by avoiding clothing that puts pressure on the area, and by watching for signs of infection.  Protect the skin around your port from seat belts. Place a soft pad over your chest if needed.  Get help right away if you have a fever or you have redness, swelling, pain, drainage, or a bad smell at the port site. This information is not intended to replace advice given to you by your health care provider. Make sure you discuss any questions you have with your health care provider. Document Released: 02/08/2005 Document Revised: 06/02/2018 Document Reviewed: 03/13/2016 Elsevier Patient Education  2020 [employer].

## 2018-11-23 ENCOUNTER — Telehealth: Payer: Self-pay | Admitting: Internal Medicine

## 2018-11-23 NOTE — Telephone Encounter (Signed)
I will see her

## 2018-11-23 NOTE — Telephone Encounter (Signed)
Hi Triage  Patient emailedm. Wanting to know if she can just do video vist tomorrow   Let her know  1. LAbs - yesterday ok  . She need repeat cbc, bmet, mag, phos in 7-10 days from yesterda  2. HRCT - still shows fibrosis . The radiologist did not comment if better or worse  3. PFT 11/20/2018 compared to feb 2020 is a tad better Results for Tasha Ballard, Tasha Ballard" (MRN [id number]) as of 11/23/2018 09:21  Ref. Range 03/30/2018 11:50 11/20/2018 15:14  FVC-Pre Latest Units: L 1.09 1.16  FVC-%Pred-Pre Latest Units: % 32 35    4. She can do video visit 11/24/18  but I do run late         LABS    PULMONARY No results for input(s): PHART, PCO2ART, PO2ART, HCO3, TCO2, O2SAT in the last 168 hours.  Invalid input(s): PCO2, PO2  CBC Recent Labs  Lab 11/22/18 0800  HGB 11.8*  HCT 37.6  WBC 5.9  PLT 172    COAGULATION No results for input(s): INR in the last 168 hours.  CARDIAC  No results for input(s): TROPONINI in the last 168 hours. No results for input(s): PROBNP in the last 168 hours.   CHEMISTRY Recent Labs  Lab 11/22/18 0800  NA 134*  K 3.3*  CL 92*  CO2 31  GLUCOSE 168*  BUN 11  CREATININE 0.80  CALCIUM 9.2  MG 1.8  PHOS 3.7   Estimated Creatinine Clearance: 93.9 mL/min (by C-G formula based on SCr of 0.8 mg/dL).   LIVER Recent Labs  Lab 11/22/18 0800  AST 23  ALT 28  ALKPHOS 53  BILITOT 0.6  PROT 6.5  ALBUMIN 3.6     INFECTIOUS No results for input(s): LATICACIDVEN, PROCALCITON in the last 168 hours.   ENDOCRINE CBG (last 3)  No results for input(s): GLUCAP in the last 72 hours.       IMAGING x48h  - image(s) personally visualized  -   highlighted in bold No results found.

## 2018-11-23 NOTE — Telephone Encounter (Signed)
She is already scheduled with you doing a video visit. Did you want her to see another provider?

## 2018-11-24 ENCOUNTER — Telehealth (INDEPENDENT_AMBULATORY_CARE_PROVIDER_SITE_OTHER): Payer: BC Managed Care – PPO | Admitting: Internal Medicine

## 2018-11-24 ENCOUNTER — Encounter: Payer: Self-pay | Admitting: Internal Medicine

## 2018-11-24 ENCOUNTER — Ambulatory Visit: Payer: BC Managed Care – PPO | Admitting: Internal Medicine

## 2018-11-24 DIAGNOSIS — J849 Interstitial pulmonary disease, unspecified: Secondary | ICD-10-CM | POA: Diagnosis not present

## 2018-11-24 DIAGNOSIS — Z79899 Other long term (current) drug therapy: Secondary | ICD-10-CM

## 2018-11-24 DIAGNOSIS — J8489 Other specified interstitial pulmonary diseases: Secondary | ICD-10-CM | POA: Diagnosis not present

## 2018-11-24 DIAGNOSIS — J9611 Chronic respiratory failure with hypoxia: Secondary | ICD-10-CM

## 2018-11-24 NOTE — Progress Notes (Signed)
-Lung function November 08, 2017: FVC 0.9 L / 27%, DLCO 5.86/29%  - HRCT 11/08/17 Rt diaphragm elevation ? Eventration v paresis.  Esophagogram November 11, 2017 - > 11/11/17 Tasha Ballard - Normal  Dr. Threasa Alpha at [facility].She was started on  IOV 11/22/2017  Chief Complaint  Patient presents with   Consult    consut due to pulmonary fibrosis. Pt has been followed by [facility] and last PFT was peformed 8/14 at [facility]. Pt does have complaints of SOB with exertion which she states has become better due to rehab. Pt also has a dry hacking cough with mild production of mucus. Denies any CP.    Is seen by presents to the ILD clinic.  This is her first visit with [name].  History is obtained from her and review of the [facility] where she is to follow-up with Dr. Hortencia Pilar.  Interstitial lung disease clinic integrated interstitial lung disease questionnaire: Symptoms: Shortness of breath for years.  Gradually worse.  Currently level 5 shortness of breath walking up stairs or walking up a hill with oxygen.  Level for shortness of breath for sweeping or vacuuming and level 3 shortness of breath for shopping or picking up things and doing laundry.  Level 3 shortness of breath or standing up from a chair or taking a shower making the bed and level 1 dyspnea for brushing teeth.  She also has a cough for 6 years which is getting worse.  On and off that is phlegm.  Occasional wheezing present there is associated fatigue.  She tells me that she had undiagnosed and unrecognized ILD for years.  This was then picked up at [facility] and then she got referred to [facility].  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  At this time she was placed on CellCept.  Then in 2016 she got somewhat worse and was placed on 2 L nasal cannula continuously.  In 2017 she tells me that she had a aortic aneurysm and this  was repaired at [facility].  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  And then for the last year and a half or so she has been on significantly worsening hypoxemia.  In the spring 2019 she is requiring 8 L at rest and 15 L at exertion.  Apparently Dr. Lauris Chroman then recommended Rituxan infusions which she got to 60 days apart with the last one being approximately in April of May 2019.  After that she is somewhat improved requiring only 9-10 L with exertion.  She has been attending pulmonary rehabilitation which has been helping.  She is obese and unable to lose weight although she is lost some.  She believes a lot of her oxygen needs might be related to deviated nasal septum/septal perforation.  She subsequently followed up with Dr. Ruthann Cancer at [facility] in the summer 2019.  I reviewed the notes.  Palliative care was recommended because of progressive respiratory failure.  She has never seen the transplant clinic at [facility] but she recommends that because of obesity and diaphragmatic dysfunction that she would not be a good candidate for transplant .  She was then frustrated with experience because she wants to fight the disease.  She then drove to [facility] and saw Dr. Elisabeth Cara at [facility] 1 week ago.  She spent  substantial number of days there.  At this point in time she showed me an email from him that suggest he might have myositis-ILD but they would like to review her CT scans and pathology from [facility].  Apparently she had significant amount of blood work and other tests at [facility]  I do not have access to these records at this point.  She believes that she wants to undergo transplant.  She thinks that she would be able to have transplant in [place] despite a high BMI.  Currently in [place] she is somewhat stable attending pulmonary rehabilitation.  She is interested in pulmonary trials  Past  medical history  -Tested for sleep apnea at [facility] and was told to be negative.  But at [facility] in September 2019 they recommended a repeat sleep study -Denied all forms of connective tissue disease diagnosis and vasculitis.  Although at [facility] September 2019 when she was seen last week she was told she might have myositis-ILD but this is pending confirmation in the multidisciplinary case conference -She does report positive for diabetes and thyroid disease and mononucleosis in the past but denies any tuberculosis or kidney disease   Review of systems -Positive for fatigue for a few years, arthralgia for a few years but denies dysphagia.  Positive for dry eyes and mouth for few years.  No Raynaud.  No recurrent fever.  No weight loss.  No acid reflux but does admit to snoring for a few years.  No rash no ulcers.  Family history of lung disease COPD and a long but no pulmonary fibrosis.  Dad did have rheumatoid arthritis  Personal exposure history Denies tobacco, vaping, marijuana, cocaine, IV drugs  Home and hobby history  [place] which is 50 years old  Occupational history -She does stay in a condition spaces.  122 point occupational questionnaire completely negative  Medication history -She has a history of allergy to Macrobid/nitrofurantoin.  Do not know when she took it.  She is currently on immunosuppressive regimen of CellCept, Bactrim and prednisone.  CellCept since 2013 and prednisone since 2012.  She did get Rituxan x2 doses with the last one being in spring 2019.  She feels her hypoxemia improved somewhat after that.     has a past medical history of Aneurysm ([place]), Diabetes mellitus without complication ([place]), and Thyroid disease.   reports that she has never smoked. She has never used smokeless tobacco.   OV 12/21/2017  Subjective:  Patient ID: Tasha Ballard, female , DOB: Jul 13, 1970 , age 22 y.o. , MRN: [id number] ,  ADDRESS: [address] [place] [address]   12/21/2017 -   Chief Complaint  Patient presents with   Follow-up    ILD     HPI SELDA JALBERT 48 y.o. -returns for follow-up.  She brings her mother along who I am meeting for the first time.  This visit is specifically focused on evaluation from [place].  Yesterday I did speak to the interstitial lung disease specialist at [facility] Dr. Threasa Alpha.  He told me he was finally able to receive and review the surgical lung biopsy from [facility].  He and his pathologist agreed with the diagnosis of progressive NSIP.  His main recommendation was to switch patient to Cytoxan.  In addition also aggressively pursue weight loss even if it means for bariatric surgery approach so we can get ready for transplant.  He agreed the prognosis is  very poor.  At this point in time patient compared to the last visit is stable.  She is using more than 10 L of oxygen up to 15 L of oxygen.  She is interested in Cytoxan.  Dr. Elisabeth Cara indicated to me that efficacy wise oral and IV are similar but he felt IV would be a little bit safer from a urologic standpoint.  Patient is interested in weight loss.  She says she has had difficulty losing weight despite being hypocaloric.  She is interested in nutritional referral.  In addition she wants a sleep specialist referral.  She says Mars OSA was ruled out.  She has significant insomnia and daytime tiredness.  She said that [place] recommended sleep referral ASAP.  She is registered to participate in the ILD-pro registry study for progressive non-IPF disease.  This was done today.   Investigations at [facility] -November 09, 2017: Echocardiogram ejection fraction 70-75% with grade 2 diastolic dysfunction.  Right ventricle was mildly enlarged but global RV function was felt to be normal.  No pericardial effusion no obvious right-to-left shunt at rest or cough or Valsalva on agitated  saline contrast exam and stage I right ventricular diastolic dysfunction.  Pulmonary artery systolic pressure at 45 mmHg  -Lung function November 08, 2017: FVC 0.9 L / 27%, DLCO 5.86/29%  - HRCT 11/08/17 Rt diaphragm elevation ? Eventration v paresis. Sniff test recommendd  Esophagogram November 11, 2017 - > 11/11/17 Natil Jewsh - Normal    12/22/2017-telephone encounter - Dr. [name] >>>starting Cytoxan 50 mg/day >>>Avoid lots of fluids to avoid cystitis, avoid intercourse without contraception, return in 7 to 10 days to recheck CBC, Bmet, LFTs, UA  12/22/17-office visit-[place] >>>Plan in lab sleep study be ordered, patient will likely need BiPAP with oxygen  12/30/2017  - Visit   48 year old female patient presenting today for one-week follow-up visit after starting Cytoxan.  Patient also get lab work completed today.  Unfortunately patient did not get this completed prior to her office visit she will get afterwards.  Patient reports baseline symptoms of nonproductive cough.  Requiring 4 to 6 L of oxygen at rest as well as 10 to 15 L of oxygen with exertion.  Patient does have her baseline shortness of breath with exertion.  Patient reporting no worsening symptoms since starting Cytoxan.   Patient reports she typically is on 9 to 10 L of oxygen at home.   OV 01/17/2018  Subjective:  Patient ID: Tasha Ballard, female , DOB: 1970-08-20 , age 5 y.o. , MRN: [id number] , ADDRESS: [address] [place] [address]   01/17/2018 -   Chief Complaint  Patient presents with   Follow-up    2 week f/u, lung transplant, was contacted and waiting on weight loss to continue   PRogressive NSIP Chronic hypoxemic resp failure Morbid Obesity - needs weight loss for transplant Rx regimen   -  cytoxan 79m per day 12/23/17 and increaed to 1014mon 12/23/17.On11/19/19. increase to 12580mer day  And switched to IV cytoxan Q4 week cycle - 1st cycle 02/03/18.   HPI Tasha Ballard 89.o. -presents for follow-up with her husband.  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reasa [facility] was started on cytoxan 2m63mr day 12/23/17 and increaed to 100mg57m11/1/19.  On 01/10/18.  increase to 125mg 97mday .  Plan is to get blood  work today.  She wants a Port-A-Cath placed.  She prefers IV Cytoxan.  I checked with Dr. Threasa Alpha and he feels either p.o. or IV Cytoxan is fine but in the balance IV Cytoxan has a slightly lower incidence of hemorrhagic cystitis and the patient prefers this.  At this point in time she tells me that the Cytoxan seems to be working well for her.  She is less hypoxemic she is able to get by with 5 L at rest.  On the other hand this could be because of weight loss.  However there is some fatigue.  She will have blood work and urine analysis to look for cytotoxicity from Cytoxan.  Based on the newINBUILD  data we discussed about starting nintedanib.  I checked with Dr. Threasa Alpha at [facility] and he is supportive of the plan.  Patient states that she has done extensive research on nintedanib and she is very excited about the possibility of taking this.   Obesity: Weight 231#  11/22/17  -> 221# 01/17/2018.,  She saw the nutritionist at [facility].  She is using an app and tracking her weight on a regular basis.  She is encouraged by the weight loss.  We gave her a low glycemic diet sheet.  She has not been able to see the bariatric surgeon at [facility] surgery but I did run into Dr. Kaylyn Lim in the hallway and he said he would be happy to see her and counsel her.  Although he did acknowledge she would be high risk for surgery.  He is in favor of low carbohydrate diet.  A few weeks ago I met the transplant physician from [facility] and he brought up the idea about losing weight loss drugs.  His preferred drug is phentermine.  We discussed the  possibility about getting an endocrine consult  Lung transplant: [facility] has called her.  They want to BMI less than 30 or 36 I am not sure.  She is working towards his goal.  She has not heard from [facility]  I emailed them and the day after the visit patient is emailed me saying that she has heard from them.  She is very keen to get on the transplant list.    02/03/2018 OV for Direct Admit for IV Cytoxan  Patient presents to the office today for direct admission to [facility] for IV Cytoxan infusion.  She says her breathing is at baseline.  She remains on oxygen 4 to 6 L at rest and 10 to 15 L with activity.  He says her leg swelling is at baseline.  She remains on Lasix 40 mg daily and Aldactone 100 mg.  She is on chronic steroids with prednisone 10 mg daily. She has been on 05 x1 week.  Says she is tolerating with no nausea vomiting or diarrhea.  Does have some mild constipation.  Patient says she had a Port-A-Cath placed 1 week ago. Patient has chronic shortness of breath gets winded with minimal activity.  Has intermittent dry cough.   02/13/2018 Follow up : Progressive NSIP , O2 RF  Patient returns for a one-week follow-up.  Patient was recently admitted for IV Cytoxan under observation status.. Patient had been on oral Cytoxan.  Continue to have progressive worsening of her ILD.  At baseline she is on 4 to 6 L at rest.  And 10 to 15 L with activity.  She is on Aldactone and Lasix.  She has  been started on OFEV 3 weeks ago.  She is tolerating well with no nausea vomiting or diarrhea.  Does have some mild constipation. Since her Cytoxan infusion.  She says she has done well.  She had minimal nausea.  She does feel that her breathing has slightly improved.  Her oxygen demands are decreased.  She is using mainly only 6 L of oxygen.  And doing well with this with good O2 saturations. Says she was able to actually go out to dinner for the first time in a long  time.  She denies any hematuria, chest pain, orthopnea or increased leg swelling.  She is working on weight loss.  She is going to the weight loss center. Labs were done on 12/20 that showed normal Hyneman blood cell count, platelets and kidney function. Urinalysis was done today.  And is pending.. Plans are for a Cytoxan infusion in 3- 4 weeks from her last infusion.  OV 03/02/2018  Subjective:  Patient ID: Tasha Ballard, female , DOB: 1971-01-31 , age 52 y.o. , MRN: [id number] , ADDRESS: [place] [id number]   03/02/2018 -   Chief Complaint  Patient presents with   Follow-up    Appt prior to pt's next Cytoxan infusion.  Pt states she is doing well since last visit. States she can tell that her breathing has improved. Has had some nausea/vomiting and wonders if it is from the [place].   #Morbid Obesity - needs weight loss for transplant - sees Dr Janeal Holmes since dec 2019 #PRogressive NSIP #Chronic hypoxemic resp failure - 2L at rest, 8L with exertion # High risk treatment regimen  -CellCept 2013 through 2019  -Chronic prednisone 2013 -currently taking -Bactrim prophylaxis 2013 --currently taking  - Cytoxan 51m per day 12/23/17 and increaed to 105mon 12/23/17.On11/19/19. increase to 12531mer day  And switched to IV cytoxan Q4 week cycle - 1st cycle 02/03/18 (s/p portocath 01/26/18)  - Ofev 01/23/18 (approximnate)   HPI ChrPHILAMENA KRAMAR 53o. -presents for the above issues.  I personally saw her before Thanksgiving 2019.  After that she is seen by nurse practitioner under my supervision as documented above for Cytoxan infusion.  She has had post Cytoxan infusion labs which showed her Oliveri count is decreasing but still adequate.  Most recent labs was February 13, 2018 at the weight loss clinic with a Tony count of 5900.  Rest of the labs reviewed and appear adequate and normal.  Including liver function test.  She is developed new onset of intermittent nausea and  vomiting ever since starting nintedanib early December 2019.  This particularly happens with the Christmas meals.  She says this appears at random and is mild and is treated with Zofran.  Given the beneficial effects of nintedanib and progressive non-IPF ILD she is agreed to continue to take this.  She continues on her chronic prednisone and Bactrim prophylaxis.  She is finished 1 cycle of IV Cytoxan February 03, 2018.  She has upcoming second cycle 4 weeks apart on March 06, 2018.  At this point in time she is feeling well.  She feels her ILD has actually improved.  On 2 L at rest she was saturating at 98%.  This is a huge improvement for her.  However when we walked her she still needed 8 L of oxygen with exertion.  She still says this is an improvement because in the past she was using more than 10 L for exertion.  Today my nurse practitioner will enter the orders for upcoming infusion.  There is no fever.  Morbid obesity: She has seen Dr. Leafy Ro at the weight loss clinic.  I reviewed the notes.  Dietary measures are indicated.  I have written to Dr. Leafy Ro asking about medications to accelerate weight loss given the fact her lung disease is poor prognosis and she is an urgent need for lung transplant and with weight loss being her main barrier.   Obesity: Weight 231#  11/22/17  -> 221# 01/17/2018., -> 222# on 03/02/2018    OV 03/30/2018  Subjective:  Patient ID: Tasha Ballard, female , DOB: Dec 06, 1970 , age 83 y.o. , MRN: [id number] , ADDRESS: [address] [place] [address]   03/30/2018 -   Chief Complaint  Patient presents with   Follow-up    PFT peformed today.  Pt states she has been doing okay since last visit.     #Morbid Obesity - needs weight loss for transplant - sees Dr Janeal Holmes since dec 2019 #PRogressive NSIP #Chronic hypoxemic resp failure - 2L at rest, 8L with exertion # High risk treatment regimen  -CellCept 2013 through 2019  -Chronic prednisone 2013 -currently  taking -Bactrim prophylaxis 2013 --currently taking  - Cytoxan Oral  88m per day 12/23/17 and increaed to 1022mon 12/23/17.On11/19/19. increase to 12567mer day   - Cytoxan IV : And switched to IV cytoxan Q4 week cycle   - 1st cycle 02/03/18 (s/p portocath 01/26/18)  = 2nd cycle 03/06/2018  - 3rd cycle  -pending 04/03/2018  - Ofev 01/23/18 (approximnate)   HPI Tasha Ballard 49o. -presents for follow-up of the above issues.  Her husband is here with her.  In terms of - Obesity: Weight 231#  11/22/17  -> 221# 01/17/2018., -> 222# on 03/02/2018 -> 220# on 03/30/2018  In terms of her progressive NSIP and chronic hypoxemic respiratory failure: She has finished 2 cycles of Cytoxan.  Second cycle was a full dose.  After that she got neutropenic as reviewed on the labs.  During this time she developed a facial rash.  This was on a weekend and over the phone we treated her with doxycycline.  It is largely improved.  Tomorrow she has an appointment with her dermatologist.  I do not know who it is.  Overall she is feeling stable in terms of her IPF.  [facility].  In fact [place] to [place] is even better.  She attributes this to both nintedanib and Cytoxan.  She is using 4 L of oxygen at rest and 8 L with exertion.  Her symptom score is documented below with shortness of breath at 21-22.  She always has a lot of fatigue.  In terms of her medicine tolerance particularly with nintedanib she had intermittent nausea and vomiting particularly after eating certain types of foods.  She is managing this with Zofran.  Symptom score is documented below.  Her most recent cycle of Cytoxan was March 06, 2018.  Her next Cytoxan is April 03, 2018.  I discussed with Dr. MohJulien Nordmanncologist was a lot of experience with Cytoxan.  He said is okay to go with full dose if I felt so.  Patient herself is very keen on getting a full dose of Cytoxan.   She did not have neutropenic fever although she got neutropenic with the second cycle.     ROS  OV 04/26/2018  Subjective:  Patient ID: Tasha Ballard, female , DOB: [birth year] , age 56 y.o. , MRN: [id number] , ADDRESS: [id number] [place] [id number]   04/26/2018 -   Chief Complaint  Patient presents with   Follow-up    Pt states she currently is not feeling too well and believes she might have sinus infection. Pt has had complaints of fatigue, has head congestion, pain in left ear going down to her neck and also has had a sore throat.     #Morbid Obesity - needs weight loss for transplant - sees Dr Janeal Holmes since dec 2019 #PRogressive NSIP #Chronic hypoxemic resp failure - 4L at rest, 8L with exertion # High risk treatment regimen  -CellCept 2013 through 2019  -Chronic prednisone 2013 -currently taking -Bactrim prophylaxis 2013 --currently taking  - Cytoxan Oral  43m per day 12/23/17 and increaed to 1064mon 12/23/17.On11/19/19. increase to 12534mer day   - Cytoxan IV : And switched to IV cytoxan Q4 week cycle   - 1st cycle 02/03/18 (s/p portocath 01/26/18)  = 2nd cycle 03/06/2018  - 3rd cycle  -  04/03/2018  - 4th cycle - 05/01/2018   - Ofev 01/23/18 (approximnate)  - Lung transplant needed: Obesity Barrier :  Weight 231#  11/22/17  -> 221# 01/17/2018., -> 222# on 03/02/2018 -> 220# on 03/30/2018 -> 223# on 04/26/2018    HPI Tasha Ballard 68o. - presents for above issues. Here with ? Mom.Since last visit had blood woirk last week and wbc nadir. Had associatd "usual fatigue of low wbc". But then father in law died and she was busy so never recovered from fatigue. Has new wheeze, sinus congestion and increased cruds. Feels she has sinusitis. Wants abx before cycle #4 cytoxan next week. She is worried about COVID-19 ans asked for advice. Otherwise well. Symptom score below      OV 10/20/2018  Subjective:  Patient ID: Tasha Richemale , DOB: 7/3Nov 08, 1972age 76 91.o. , MRN: [id number]: [id number] Obesity - needs weight loss for transplant - sees Dr CarJaneal Holmesnce dec 2019 #PRogressive NSIP #Chronic hypoxemic resp failure - 4L at rest, 8L with exertion # High risk treatment regimen  -CellCept 2013 through 2019  -Chronic prednisone 2013 -currently taking -Bactrim prophylaxis 2013 --currently taking  - Cytoxan Oral  14m36mr day 12/23/17 and increaed to 100mg56m11/1/19.On11/19/19. increase to 125mg 95mday   - Cytoxan IV : And switched to IV cytoxan Q4 week cycle   - 1st cycle 02/03/18 (s/p portocath 01/26/18)  = 2nd cycle 03/06/2018  - 3rd cycle  -  04/03/2018  - 4th cycle - 05/01/2018 - Cytoxan oral (due to pandemic covid-19 and need to avoid infusion visits) - spring 2020   - Ofev 01/23/18 (approximnate)  - Lung transplant needed: Obesity Barrier :  Weight 231#  11/22/17  -> 221# 01/17/2018., -> 222# on 03/02/2018 -> 220# on 03/30/2018 -> 223# on 04/26/2018 -> 224# on 10/20/2018   10/20/2018 -   Chief Complaint  Patient presents with   ILD (interstitial lung disease) (HCC)  DeuelHPI ChristPAMLEA FINDERo43-presents for follow-up of the above issues.  She reports that over summer 2020 her husband nearly got exposed to someone with COVID this was in the outdoor setting.  She never ended up getting COVID.  On October 22, 2018 she tested negative for COVID. On October 04, 2018 she had a virtual visit with nurse practitioner for sinus complaints.  She took a 10-day course of Augmentin.  She was taking this along with Bactrim despite instructions to hold Bactrim.  This gave her immense diarrhea.  She is also reporting that the diarrhea has now improved after finishing the Augmentin course.  However she is having fatigue for the last 1 month.  She feels this because of the oral Cytoxan.  She wants to switch to IV Cytoxan.  She is in need of a flu shot and is willing to have that.  Her Port-A-Cath site is fine.   Currently she is on prednisone, oral Cytoxan, Bactrim prophylaxis and nintedanib.  She maintains herself on 6 L.  Her symptom score and oxygen needs continue to be stable.  Obesity continues to be a barrier for transplant.  In terms of her ILD her last PFT was in February 2020 and CT scan was in  Nov 2019.    OV 11/24/2018  Subjective:  Patient ID: Tasha Ballard, female , DOB: 01/15/1971 , age 71 y.o. , MRN: [id number] , ADDRESS: [place] [id number]   #Morbid Obesity - needs weight loss for transplant - sees Dr Janeal Holmes since dec 2019 #PRogressive NSIP #Chronic hypoxemic resp failure - 4L at rest, 8L with exertion # High risk treatment regimen  -CellCept 2013 through 2019  -Chronic prednisone 2013 -currently taking -Bactrim prophylaxis 2013 --currently taking  - Cytoxan Oral  82m per day 12/23/17 and increaed to 1026mon 12/23/17.On11/19/19. increase to 1258mer day   - Cytoxan IV : And switched to IV cytoxan Q4 week cycle   - 1st cycle 02/03/18 (s/p portocath 01/26/18)  = 2nd cycle 03/06/2018  - 3rd cycle  -  04/03/2018  - 4th cycle - 05/01/2018  - Cytoxan oral (due to pandemic covid-19 and need to avoid infusion visits) - spring 2020 -   - 5th cycle IV cytoxan - 11/22/2018   - Ofev 01/23/18 (approximnate)  - Lung transplant needed: Obesity Barrier :  Weight 231#  11/22/17  -> 221# 01/17/2018., -> 222# on 03/02/2018 -> 220# on 03/30/2018 -> 223# on 04/26/2018 -> 224# on 10/20/2018    11/24/2018 -video visit for the above issues   HPI ChrMARIAEDUARDA Ballard 73o. -had IV Cytoxan after switching from oral Cytoxan on November 22, 2018.  She tolerated infusion fine.  Pre-infusion labs were okay.  Today's video visit post Cytoxan infusion.  She tells me she is fatigued consistent with her Cytoxan.  Her most recent PFTs show minor improvement.  High-resolution CT chest without comparison shows ILD.  However she feels that post chemo she is now needing between 8 and 10 L of oxygen  with exertion she feels this is because of the fatigue.  She thinks the fatigue will improve.  She prefers IV Cytoxan cycles cyclical therapy.  She is now interested in pulmonary rehabilitation if they were N.  She wants me to make a referral.  She has not lost weight although she says that she is eating less.  She feels that his rehab she will be able to lose weight most recent labs pony function test and CT scan reviewed.  The CT scan was personally visualized.  High-resolution CT chest was done November 20, 2018     SYMPTOM SCALE - ILD 03/30/2018  04/26/2018  10/20/2018   O2 use  4 L at rest, 8 L with exertion Same but prefers 6L at  all times 6L   Shortness of Breath 0 -> 5 scale with 5 being worst (score 6 If unable to do)    At rest 0 1 0  Simple tasks - showers, clothes change, eating, shaving 1 3.5 3.5  Household (dishes, doing bed, laundry) 2.5 4.5 3.5  Shopping 3 4.5 3.5  Walking level at own pace 3 3.5 3.5  Walking keeping up with others of same age _0 Walking up Stairs 4 5 4.5  Walking up Hill 4 5 4.5  Total (40 - 48) Dyspnea Score 21.5 32 28  How bad is your cough? x 2.5 x  How bad is your fatigue 5 ("very, lol" 4 Extreme 5+   Results for Roach, Tasha Ballard" (MRN [id number]) as of 11/24/2018 10:08  Ref. Range 03/30/2018 11:50 11/20/2018 15:14  FVC-Pre Latest Units: L 1.09 1.16  FVC-%Pred-Pre Latest Units: % 32 35  Results for Volland, Greg Y "Tasha Ballard" (MRN [id number]) as of 11/24/2018 10:08  Ref. Range 03/30/2018 11:50 11/20/2018 15:14  DLCO unc Latest Units: ml/min/mmHg 6.85 7.29  DLCO unc % pred Latest Units: % 34 37      LABS    PULMONARY No results for input(s): PHART, PCO2ART, PO2ART, HCO3, TCO2, O2SAT in the last 168 hours.  Invalid input(s): PCO2, PO2  CBC Recent Labs  Lab 11/22/18 0800  HGB 11.8*  HCT 37.6  WBC 5.9  PLT 172    COAGULATION No results for input(s): INR in the last 168 hours.  CARDIAC  No results for input(s): TROPONINI in  the last 168 hours. No results for input(s): PROBNP in the last 168 hours.   CHEMISTRY Recent Labs  Lab 11/22/18 0800  NA 134*  K 3.3*  CL 92*  CO2 31  GLUCOSE 168*  BUN 11  CREATININE 0.80  CALCIUM 9.2  MG 1.8  PHOS 3.7   Estimated Creatinine Clearance: 93.9 mL/min (by C-G formula based on SCr of 0.8 mg/dL).   LIVER Recent Labs  Lab 11/22/18 0800  AST 23  ALT 28  ALKPHOS 53  BILITOT 0.6  PROT 6.5  ALBUMIN 3.6     INFECTIOUS No results for input(s): LATICACIDVEN, PROCALCITON in the last 168 hours.   ENDOCRINE CBG (last 3)  No results for input(s): GLUCAP in the last 72 hours.       IMAGING x48h  - image(s) personally visualized  -   highlighted in bold No results found.   ROS - per HPI     has a past medical history of Aneurysm ([place]), Back pain, Diabetes mellitus without complication ([place]), H/O blood clots, Hypertension, Joint pain, Pulmonary fibrosis (HCC), SOB (shortness of breath), Swelling, Thyroid disease, and Vitamin D deficiency.   reports that she has never smoked. She has never used smokeless tobacco.  Past Surgical History:  Procedure Laterality Date   ABLATION     IR IMAGING GUIDED PORT INSERTION  01/26/2018   KNEE SURGERY     LUNG BIOPSY      Allergies  Allergen Reactions   Macrobid [Nitrofurantoin Macrocrystal] Nausea And Vomiting   Adhesive [Tape] Rash    Immunization History  Administered Date(s) Administered   Influenza,inj,Quad PF,6+ Mos 12/24/2014, 02/05/2016, 01/07/2017, 11/22/2017, 10/20/2018   Influenza-Unspecified 12/24/2014, 02/05/2016, 01/07/2017, 11/22/2017   Pneumococcal Conjugate-13 07/20/2017   Pneumococcal Polysaccharide-23 06/09/2012   Tdap 02/19/2011    Family History  Problem Relation Age of Onset   Heart failure Mother    Heart disease Mother    Rheumatologic  disease Father      Current Outpatient Medications:    benzonatate (TESSALON) 200 MG capsule, Take 1 capsule (200 mg  total) by mouth 3 (three) times daily as needed for cough., Disp: 30 capsule, Rfl: 1   carvedilol (COREG) 6.25 MG tablet, Take 6.25 mg by mouth 2 (two) times daily with a meal., Disp: , Rfl:    clindamycin (CLEOCIN T) 1 % lotion, , Disp: , Rfl:    cyclophosphamide (CYTOXAN) 50 MG capsule, Take 2 capsules (100 mg total) by mouth daily. Give on an empty stomach 1 hour before or 2 hours after meals., Disp: 60 capsule, Rfl: 2   furosemide (LASIX) 40 MG tablet, Take 40 mg by mouth See admin instructions. Take 1 tablet (40 mg) daily, may repeat dose if needed for fluid retention., Disp: , Rfl:    gabapentin (NEURONTIN) 100 MG capsule, Take 1 capsule by mouth as needed., Disp: , Rfl:    metFORMIN (GLUCOPHAGE) 500 MG tablet, Take 500 mg by mouth 2 (two) times daily. , Disp: , Rfl:    montelukast (SINGULAIR) 10 MG tablet, Take 10 mg by mouth daily after supper., Disp: , Rfl:    OFEV 150 MG CAPS, Take 150 mg by mouth 2 (two) times daily., Disp: , Rfl: 11   ondansetron (ZOFRAN) 8 MG tablet, TAKE 1 TABLET BY MOUTH EVERY 8 HOURS AS NEEDED FOR NAUSEA & VOMITING, Disp: 45 tablet, Rfl: 0   potassium chloride (K-DUR) 10 MEQ tablet, Take 2 tablets (20 mEq total) by mouth 2 (two) times daily., Disp: 120 tablet, Rfl: 2   predniSONE (DELTASONE) 10 MG tablet, Take 10-69m as needed for SOB, Disp: 30 tablet, Rfl: 3   PROAIR HFA 108 (90 Base) MCG/ACT inhaler, Inhale 2 puffs into the lungs every 6 (six) hours as needed for wheezing or shortness of breath., Disp: , Rfl: 4   saccharomyces boulardii (FLORASTOR) 250 MG capsule, Take 1 capsule (250 mg total) by mouth 2 (two) times daily., Disp: 60 capsule, Rfl: 0   spironolactone (ALDACTONE) 100 MG tablet, Take 100 mg by mouth daily., Disp: , Rfl:    sulfamethoxazole-trimethoprim (BACTRIM) 400-80 MG tablet, Once a day while on prednisone (Patient taking differently: Take 1 tablet by mouth every evening. ), Disp: 90 tablet, Rfl: 1   thyroid (ARMOUR) 90 MG tablet,  Take 90 mg by mouth daily before breakfast. , Disp: , Rfl:    Vitamin D, Ergocalciferol, (DRISDOL) 1.25 MG (50000 UT) CAPS capsule, TAKE 1 CAPSULE BY MOUTH ONCE WEEKLY, Disp: 3 capsule, Rfl: 0      Objective:   There were no vitals filed for this visit.  Estimated body mass index is 41.01 kg/m as calculated from the following:   Height as of 10/20/18: _0  (1.575 m).   Weight as of 11/22/18: 224 lb 3.2 oz (101.7 kg).  _1 @  There were no vitals filed for this visit.   Physical Exam Video visit.  On this video exam she is sitting comfortably without any distress wearing oxygen        Assessment:       ICD-10-CM   1. Chronic respiratory failure with hypoxia (HCC)  J96.11   2. ILD (interstitial lung disease) ([place]  J84.9   3. NSIP (nonspecific interstitial pneumonia) ([place]  J84.89   4. High risk medication use  Z79.899        Plan:     Patient Instructions     ICD-10-CM   1. Chronic respiratory failure with hypoxia (  [place])  J96.11   2. ILD (interstitial lung disease) ([place])  J84.9   3. NSIP (nonspecific interstitial pneumonia) ([place])  J84.89   4. High risk medication use  Z79.899     PFT stable/slight better since feb 2020 CT with ILD Clinically stable O2 increase past fewr days could be cytoxan related fatigue Most recent cytoxan 11/22/2018  PLAN  - In 7 days from 11/24/2018 do post cytoxan labs of cbc, bmet, mag, phos and urine analysis - refer pulmonary rehab  - next cycle IV cytoxam around end of oct 2020 - [name] Parrett will coordinate    > 50% of this > 25 min visit spent in face to face counseling or coordination of care - by this undersigned MD - Dr Brand Males. This includes one or more of the following documented above: discussion of test results, diagnostic or treatment recommendations, prognosis, risks and benefits of management options, instructions, education, compliance or risk-factor reduction   SIGNATURE    Dr. Brand Males,  M.D., F.C.C.P,  Pulmonary and Critical Care Medicine Staff Physician, [place] Director - Interstitial Lung Disease  Program  Pulmonary [place] at [place], [place], [address]  Pager: [phone], If no answer or between  15:00h - 7:00h: call [phone] Telephone: [phone]  10:34 AM 11/24/2018

## 2018-11-24 NOTE — Patient Instructions (Addendum)
ICD-10-CM   1. Chronic respiratory failure with hypoxia (HCC)  J96.11   2. ILD (interstitial lung disease) ([place])  J84.9   3. NSIP (nonspecific interstitial pneumonia) ([place])  J84.89   4. High risk medication use  Z79.899     PFT stable/slight better since feb 2020 CT with ILD Clinically stable O2 increase past fewr days could be cytoxan related fatigue Most recent cytoxan 11/22/2018  PLAN  - In 7 days from 11/24/2018 do post cytoxan labs of cbc, bmet, mag, phos and urine analysis - refer pulmonary rehab  - next cycle IV cytoxam around end of oct 2020 - [name] Parrett will coordinate

## 2018-11-24 NOTE — Telephone Encounter (Signed)
Pt already scheduled for video visit with MR today

## 2018-11-24 NOTE — Addendum Note (Signed)
Addended by: Hildred Alamin I on: 11/24/2018 11:02 AM   Modules accepted: Orders

## 2018-12-01 ENCOUNTER — Other Ambulatory Visit (INDEPENDENT_AMBULATORY_CARE_PROVIDER_SITE_OTHER): Payer: BC Managed Care – PPO

## 2018-12-01 DIAGNOSIS — J849 Interstitial pulmonary disease, unspecified: Secondary | ICD-10-CM | POA: Diagnosis not present

## 2018-12-01 DIAGNOSIS — J9611 Chronic respiratory failure with hypoxia: Secondary | ICD-10-CM

## 2018-12-01 LAB — CBC WITH DIFFERENTIAL/PLATELET
Basophils Absolute: 0 10*3/uL (ref 0.0–0.1)
Basophils Relative: 0.6 % (ref 0.0–3.0)
Eosinophils Absolute: 0 10*3/uL (ref 0.0–0.7)
Eosinophils Relative: 0.8 % (ref 0.0–5.0)
HCT: 37.8 % (ref 36.0–46.0)
Hemoglobin: 12.3 g/dL (ref 12.0–15.0)
Lymphocytes Relative: 11.4 % — ABNORMAL LOW (ref 12.0–46.0)
Lymphs Abs: 0.6 10*3/uL — ABNORMAL LOW (ref 0.7–4.0)
MCHC: 32.6 g/dL (ref 30.0–36.0)
MCV: 99.4 fl (ref 78.0–100.0)
Monocytes Absolute: 0.5 10*3/uL (ref 0.1–1.0)
Monocytes Relative: 10.6 % (ref 3.0–12.0)
Neutro Abs: 3.8 10*3/uL (ref 1.4–7.7)
Neutrophils Relative %: 76.6 % (ref 43.0–77.0)
Platelets: 229 10*3/uL (ref 150.0–400.0)
RBC: 3.8 Mil/uL — ABNORMAL LOW (ref 3.87–5.11)
RDW: 14.6 % (ref 11.5–15.5)
WBC: 5 10*3/uL (ref 4.0–10.5)

## 2018-12-01 LAB — BASIC METABOLIC PANEL
BUN: 12 mg/dL (ref 6–23)
CO2: 35 mEq/L — ABNORMAL HIGH (ref 19–32)
Calcium: 10 mg/dL (ref 8.4–10.5)
Chloride: 92 mEq/L — ABNORMAL LOW (ref 96–112)
Creatinine, Ser: 0.98 mg/dL (ref 0.40–1.20)
GFR: 60.01 mL/min (ref >60.00)
Glucose, Bld: 131 mg/dL — ABNORMAL HIGH (ref 70–99)
Potassium: 4 mEq/L (ref 3.5–5.1)
Sodium: 137 mEq/L (ref 135–145)

## 2018-12-01 LAB — MAGNESIUM: Magnesium: 1.8 mg/dL (ref 1.5–2.5)

## 2018-12-01 LAB — PHOSPHORUS: Phosphorus: 4.9 mg/dL — ABNORMAL HIGH (ref 2.3–4.6)

## 2018-12-04 ENCOUNTER — Telehealth: Payer: Self-pay | Admitting: Internal Medicine

## 2018-12-04 ENCOUNTER — Other Ambulatory Visit (INDEPENDENT_AMBULATORY_CARE_PROVIDER_SITE_OTHER): Payer: BC Managed Care – PPO

## 2018-12-04 DIAGNOSIS — J9611 Chronic respiratory failure with hypoxia: Secondary | ICD-10-CM | POA: Diagnosis not present

## 2018-12-04 LAB — URINALYSIS, ROUTINE W REFLEX MICROSCOPIC
Bilirubin Urine: NEGATIVE
Hgb urine dipstick: NEGATIVE
Ketones, ur: NEGATIVE
Nitrite: NEGATIVE
RBC / HPF: NONE SEEN (ref 0–?)
Specific Gravity, Urine: 1.02 (ref 1.000–1.030)
Urine Glucose: NEGATIVE
Urobilinogen, UA: 1 (ref 0.0–1.0)
pH: 7 (ref 5.0–8.0)

## 2018-12-04 MED ORDER — CIPROFLOXACIN HCL 500 MG PO TABS: 500.0000 mg | ORAL_TABLET | Freq: Two times a day (BID) | ORAL | 0 refills | Status: AC

## 2018-12-04 NOTE — Telephone Encounter (Signed)
Called and spoke to patient. Relayed message/instructions from Dr. [name].  Patient verbalized understanding.  Patient requested Rx go to [facility].  Rx sent. Nothing further needed at this time.

## 2018-12-04 NOTE — Telephone Encounter (Signed)
Please advise. Thanks.

## 2018-12-04 NOTE — Telephone Encounter (Signed)
Patient Tasha Ballard emailed me saying that she thinks she has UTI  Plan  - hydrate  - hold bactrim  - start cipro 574m bid x 5 days  Allergies  Allergen Reactions  . Macrobid [Nitrofurantoin Macrocrystal] Nausea And Vomiting  . Adhesive [Tape] Rash      Current Outpatient Medications:  .  benzonatate (TESSALON) 200 MG capsule, Take 1 capsule (200 mg total) by mouth 3 (three) times daily as needed for cough., Disp: 30 capsule, Rfl: 1 .  carvedilol (COREG) 6.25 MG tablet, Take 6.25 mg by mouth 2 (two) times daily with a meal., Disp: , Rfl:  .  clindamycin (CLEOCIN T) 1 % lotion, , Disp: , Rfl:  .  cyclophosphamide (CYTOXAN) 50 MG capsule, Take 2 capsules (100 mg total) by mouth daily. Give on an empty stomach 1 hour before or 2 hours after meals., Disp: 60 capsule, Rfl: 2 .  furosemide (LASIX) 40 MG tablet, Take 40 mg by mouth See admin instructions. Take 1 tablet (40 mg) daily, may repeat dose if needed for fluid retention., Disp: , Rfl:  .  gabapentin (NEURONTIN) 100 MG capsule, Take 1 capsule by mouth as needed., Disp: , Rfl:  .  metFORMIN (GLUCOPHAGE) 500 MG tablet, Take 500 mg by mouth 2 (two) times daily. , Disp: , Rfl:  .  montelukast (SINGULAIR) 10 MG tablet, Take 10 mg by mouth daily after supper., Disp: , Rfl:  .  OFEV 150 MG CAPS, Take 150 mg by mouth 2 (two) times daily., Disp: , Rfl: 11 .  ondansetron (ZOFRAN) 8 MG tablet, TAKE 1 TABLET BY MOUTH EVERY 8 HOURS AS NEEDED FOR NAUSEA & VOMITING, Disp: 45 tablet, Rfl: 0 .  potassium chloride (K-DUR) 10 MEQ tablet, Take 2 tablets (20 mEq total) by mouth 2 (two) times daily., Disp: 120 tablet, Rfl: 2 .  predniSONE (DELTASONE) 10 MG tablet, Take 10-229mas needed for SOB, Disp: 30 tablet, Rfl: 3 .  PROAIR HFA 108 (90 Base) MCG/ACT inhaler, Inhale 2 puffs into the lungs every 6 (six) hours as needed for wheezing or shortness of breath., Disp: , Rfl: 4 .  saccharomyces boulardii (FLORASTOR) 250 MG capsule, Take 1 capsule (250 mg  total) by mouth 2 (two) times daily., Disp: 60 capsule, Rfl: 0 .  spironolactone (ALDACTONE) 100 MG tablet, Take 100 mg by mouth daily., Disp: , Rfl:  .  sulfamethoxazole-trimethoprim (BACTRIM) 400-80 MG tablet, Once a day while on prednisone (Patient taking differently: Take 1 tablet by mouth every evening. ), Disp: 90 tablet, Rfl: 1 .  thyroid (ARMOUR) 90 MG tablet, Take 90 mg by mouth daily before breakfast. , Disp: , Rfl:  .  Vitamin D, Ergocalciferol, (DRISDOL) 1.25 MG (50000 UT) CAPS capsule, TAKE 1 CAPSULE BY MOUTH ONCE WEEKLY, Disp: 3 capsule, Rfl: 0

## 2018-12-05 NOTE — Telephone Encounter (Signed)
    REsults given to her directly via email

## 2018-12-07 ENCOUNTER — Other Ambulatory Visit: Payer: Self-pay | Admitting: Internal Medicine

## 2018-12-20 ENCOUNTER — Other Ambulatory Visit: Payer: Self-pay

## 2018-12-20 ENCOUNTER — Telehealth: Payer: Self-pay | Admitting: Adult Health

## 2018-12-20 ENCOUNTER — Encounter (HOSPITAL_COMMUNITY): Payer: Self-pay

## 2018-12-20 ENCOUNTER — Encounter (HOSPITAL_COMMUNITY)
Admission: RE | Admit: 2018-12-20 | Discharge: 2018-12-20 | Disposition: A | Discharge status: Home / Self Care | Payer: BC Managed Care – PPO | Source: Ambulatory Visit | Attending: Internal Medicine | Admitting: Internal Medicine

## 2018-12-20 DIAGNOSIS — J849 Interstitial pulmonary disease, unspecified: Secondary | ICD-10-CM

## 2018-12-20 DIAGNOSIS — J84112 Idiopathic pulmonary fibrosis: Secondary | ICD-10-CM | POA: Diagnosis not present

## 2018-12-20 LAB — COMPREHENSIVE METABOLIC PANEL
ALT: 22 U/L (ref 0–44)
AST: 23 U/L (ref 15–41)
Albumin: 3.3 g/dL — ABNORMAL LOW (ref 3.5–5.0)
Alkaline Phosphatase: 51 U/L (ref 38–126)
Anion gap: 10 (ref 5–15)
BUN: 8 mg/dL (ref 6–20)
CO2: 30 mmol/L (ref 22–32)
Calcium: 9.1 mg/dL (ref 8.9–10.3)
Chloride: 95 mmol/L — ABNORMAL LOW (ref 98–111)
Creatinine, Ser: 0.83 mg/dL (ref 0.44–1.00)
GFR calc Af Amer: >60 mL/min (ref >60)
GFR calc non Af Amer: >60 mL/min (ref >60)
Glucose, Bld: 143 mg/dL — ABNORMAL HIGH (ref 70–99)
Potassium: 3.7 mmol/L (ref 3.5–5.1)
Sodium: 135 mmol/L (ref 135–145)
Total Bilirubin: 0.5 mg/dL (ref 0.3–1.2)
Total Protein: 6.2 g/dL — ABNORMAL LOW (ref 6.5–8.1)

## 2018-12-20 LAB — URINALYSIS, ROUTINE W REFLEX MICROSCOPIC
Bilirubin Urine: NEGATIVE
Glucose, UA: NEGATIVE mg/dL
Ketones, ur: NEGATIVE mg/dL
Leukocytes,Ua: NEGATIVE
Nitrite: NEGATIVE
Protein, ur: NEGATIVE mg/dL
Specific Gravity, Urine: 1.005 (ref 1.005–1.030)
pH: 7 (ref 5.0–8.0)

## 2018-12-20 LAB — CBC WITH DIFFERENTIAL/PLATELET
Abs Immature Granulocytes: 0.08 10*3/uL — ABNORMAL HIGH (ref 0.00–0.07)
Basophils Absolute: 0.1 10*3/uL (ref 0.0–0.1)
Basophils Relative: 1 %
Eosinophils Absolute: 0.1 10*3/uL (ref 0.0–0.5)
Eosinophils Relative: 1 %
HCT: 38.5 % (ref 36.0–46.0)
Hemoglobin: 11.9 g/dL — ABNORMAL LOW (ref 12.0–15.0)
Immature Granulocytes: 1 %
Lymphocytes Relative: 18 %
Lymphs Abs: 1.2 10*3/uL (ref 0.7–4.0)
MCH: 31.4 pg (ref 26.0–34.0)
MCHC: 30.9 g/dL (ref 30.0–36.0)
MCV: 101.6 fL — ABNORMAL HIGH (ref 80.0–100.0)
Monocytes Absolute: 0.7 10*3/uL (ref 0.1–1.0)
Monocytes Relative: 10 %
Neutro Abs: 4.8 10*3/uL (ref 1.7–7.7)
Neutrophils Relative %: 69 %
Platelets: 199 10*3/uL (ref 150–400)
RBC: 3.79 MIL/uL — ABNORMAL LOW (ref 3.87–5.11)
RDW: 13.1 % (ref 11.5–15.5)
WBC: 6.9 10*3/uL (ref 4.0–10.5)
nRBC: 0 % (ref 0.0–0.2)

## 2018-12-20 LAB — PHOSPHORUS: Phosphorus: 4.2 mg/dL (ref 2.5–4.6)

## 2018-12-20 LAB — MAGNESIUM: Magnesium: 1.9 mg/dL (ref 1.7–2.4)

## 2018-12-20 MED ORDER — DEXTROSE 5 % IV SOLN: 1000.0000 mg | Freq: Once | INTRAVENOUS | Status: DC

## 2018-12-20 MED ORDER — MESNA 100 MG/ML IV SOLN
250.0000 mg | Freq: Once | INTRAVENOUS | Status: AC
  Administered 2018-12-20: 250 mg via INTRAVENOUS
  Filled 2018-12-20: qty 2.5

## 2018-12-20 MED ORDER — ONDANSETRON HCL 8 MG PO TABS
8.0000 mg | ORAL_TABLET | ORAL | Status: DC
  Filled 2018-12-20: qty 1

## 2018-12-20 MED ORDER — DEXTROSE 5 % IV SOLN
1000.0000 mg | Freq: Once | INTRAVENOUS | Status: AC
  Administered 2018-12-20: 1000 mg via INTRAVENOUS
  Filled 2018-12-20: qty 50

## 2018-12-20 MED ORDER — DEXTROSE-NACL 5-0.45 % IV SOLN
Freq: Once | INTRAVENOUS | Status: AC
  Administered 2018-12-20: 08:00:00 via INTRAVENOUS

## 2018-12-20 MED ORDER — HEPARIN SOD (PORK) LOCK FLUSH 100 UNIT/ML IV SOLN
500.0000 [IU] | Freq: Once | INTRAVENOUS | Status: AC
  Administered 2018-12-20: 500 [IU] via INTRAVENOUS
  Filled 2018-12-20: qty 5

## 2018-12-20 MED ORDER — ONDANSETRON HCL 8 MG PO TABS
8.0000 mg | ORAL_TABLET | Freq: Once | ORAL | Status: AC
  Administered 2018-12-20: 8 mg via ORAL
  Filled 2018-12-20: qty 1

## 2018-12-20 MED ORDER — POTASSIUM CHLORIDE CRYS ER 20 MEQ PO TBCR
40.0000 meq | EXTENDED_RELEASE_TABLET | Freq: Once | ORAL | Status: AC
  Administered 2018-12-20: 40 meq via ORAL
  Filled 2018-12-20: qty 2

## 2018-12-20 MED ORDER — DEXTROSE-NACL 5-0.45 % IV SOLN
Freq: Once | INTRAVENOUS | Status: AC
  Administered 2018-12-20: 12:00:00 via INTRAVENOUS

## 2018-12-20 MED ORDER — HYDROCORTISONE NA SUCCINATE PF 100 MG IJ SOLR
100.0000 mg | Freq: Once | INTRAMUSCULAR | Status: AC
  Administered 2018-12-20: 100 mg via INTRAVENOUS
  Filled 2018-12-20: qty 2

## 2018-12-20 NOTE — Telephone Encounter (Signed)
Please set up labs in office (post cytoxan infusion ) on 12/29/18  cbc, bmet, mag, phos and UA w/ micro , urine culture   Office visit in office with Dr. [name] or Parrett NP week of 11/9-13 for follow up .

## 2018-12-20 NOTE — Progress Notes (Signed)
Spoke with Dr. Purnell Shoemaker, he okayed the administration of the Cytoxan today.  He did speak with Rexene Edison, NP and they agreed the dosage will remain at 0.5gm/m2.  Pharmacy was notified.  Rexene Edison, NP also spoke with the patient via phone about labs and as to why the Cytoxan was not increased this week.    Dr. Purnell Shoemaker also ordered a dose of KCL 53mq to be given today in short stay.  Pt's K+ level was 3.7 today in short stay,12/20/18.

## 2018-12-20 NOTE — Telephone Encounter (Signed)
Spoke with the pt and notified of recs per TP  She verbalized understanding  Orders were placed and appt scheduled with MR  She wanted me to let TP know that she wants a call with her urine culture results from today when they are ready,thanks

## 2018-12-20 NOTE — Telephone Encounter (Signed)
Please let her know it will take 1-3 days for the urine cx, , it will not be back today, as soon as back we will call her

## 2018-12-20 NOTE — Telephone Encounter (Signed)
Attempted to call patient, no answer, left message to call back.  

## 2018-12-21 LAB — URINE CULTURE

## 2018-12-21 NOTE — Telephone Encounter (Signed)
ATC patient unable to reach LM to call back office (x1)  

## 2018-12-21 NOTE — Telephone Encounter (Signed)
TP please advise, results are back. Thanks.

## 2018-12-21 NOTE — Telephone Encounter (Signed)
Called and spoke to pt. Informed her of the results and recs per TP. Pt verbalized understanding and denied any further questions or concerns at this time.

## 2018-12-21 NOTE — Telephone Encounter (Signed)
Urine cx did not show a specific infection . Showed multiple organisms which can be a contaminate.   No abx at this time  Will repeat at follow up labs , if she develops urinary sx please call us sooner   Please contact office for sooner follow up if symptoms do not improve or worsen or seek emergency care

## 2018-12-29 ENCOUNTER — Other Ambulatory Visit (INDEPENDENT_AMBULATORY_CARE_PROVIDER_SITE_OTHER): Payer: BC Managed Care – PPO

## 2018-12-29 ENCOUNTER — Other Ambulatory Visit: Payer: Self-pay | Admitting: Internal Medicine

## 2018-12-29 DIAGNOSIS — J849 Interstitial pulmonary disease, unspecified: Secondary | ICD-10-CM

## 2018-12-29 LAB — CBC WITH DIFFERENTIAL/PLATELET
Basophils Absolute: 0 10*3/uL (ref 0.0–0.1)
Basophils Relative: 0.4 % (ref 0.0–3.0)
Eosinophils Absolute: 0.1 10*3/uL (ref 0.0–0.7)
Eosinophils Relative: 2.3 % (ref 0.0–5.0)
HCT: 39.1 % (ref 36.0–46.0)
Hemoglobin: 12.4 g/dL (ref 12.0–15.0)
Lymphocytes Relative: 9.9 % — ABNORMAL LOW (ref 12.0–46.0)
Lymphs Abs: 0.6 10*3/uL — ABNORMAL LOW (ref 0.7–4.0)
MCHC: 31.8 g/dL (ref 30.0–36.0)
MCV: 98.3 fl (ref 78.0–100.0)
Monocytes Absolute: 0.5 10*3/uL (ref 0.1–1.0)
Monocytes Relative: 7.8 % (ref 3.0–12.0)
Neutro Abs: 4.7 10*3/uL (ref 1.4–7.7)
Neutrophils Relative %: 79.6 % — ABNORMAL HIGH (ref 43.0–77.0)
Platelets: 232 10*3/uL (ref 150.0–400.0)
RBC: 3.98 Mil/uL (ref 3.87–5.11)
RDW: 14.2 % (ref 11.5–15.5)
WBC: 5.8 10*3/uL (ref 4.0–10.5)

## 2018-12-29 LAB — BASIC METABOLIC PANEL
BUN: 13 mg/dL (ref 6–23)
CO2: 34 mEq/L — ABNORMAL HIGH (ref 19–32)
Calcium: 9.3 mg/dL (ref 8.4–10.5)
Chloride: 94 mEq/L — ABNORMAL LOW (ref 96–112)
Creatinine, Ser: 0.85 mg/dL (ref 0.40–1.20)
GFR: 70.7 mL/min (ref >60.00)
Glucose, Bld: 146 mg/dL — ABNORMAL HIGH (ref 70–99)
Potassium: 3.5 mEq/L (ref 3.5–5.1)
Sodium: 136 mEq/L (ref 135–145)

## 2018-12-29 LAB — PHOSPHORUS: Phosphorus: 4.1 mg/dL (ref 2.3–4.6)

## 2018-12-29 LAB — MAGNESIUM: Magnesium: 1.8 mg/dL (ref 1.5–2.5)

## 2019-01-01 ENCOUNTER — Other Ambulatory Visit: Payer: Self-pay | Admitting: Adult Health

## 2019-01-03 ENCOUNTER — Encounter: Payer: Self-pay | Admitting: Internal Medicine

## 2019-01-03 ENCOUNTER — Ambulatory Visit (INDEPENDENT_AMBULATORY_CARE_PROVIDER_SITE_OTHER): Payer: BC Managed Care – PPO | Admitting: Internal Medicine

## 2019-01-03 ENCOUNTER — Telehealth: Payer: Self-pay | Admitting: Internal Medicine

## 2019-01-03 ENCOUNTER — Other Ambulatory Visit: Payer: Self-pay

## 2019-01-03 VITALS — BP 126/70 | HR 95 | Ht 62.0 in | Wt 219.0 lb

## 2019-01-03 DIAGNOSIS — Z7189 Other specified counseling: Secondary | ICD-10-CM

## 2019-01-03 DIAGNOSIS — Z7185 Encounter for immunization safety counseling: Secondary | ICD-10-CM

## 2019-01-03 DIAGNOSIS — J849 Interstitial pulmonary disease, unspecified: Secondary | ICD-10-CM

## 2019-01-03 DIAGNOSIS — Z23 Encounter for immunization: Secondary | ICD-10-CM | POA: Diagnosis not present

## 2019-01-03 DIAGNOSIS — Z79899 Other long term (current) drug therapy: Secondary | ICD-10-CM

## 2019-01-03 DIAGNOSIS — J9611 Chronic respiratory failure with hypoxia: Secondary | ICD-10-CM | POA: Diagnosis not present

## 2019-01-03 DIAGNOSIS — E876 Hypokalemia: Secondary | ICD-10-CM

## 2019-01-03 DIAGNOSIS — Z006 Encounter for examination for normal comparison and control in clinical research program: Secondary | ICD-10-CM

## 2019-01-03 DIAGNOSIS — J8489 Other specified interstitial pulmonary diseases: Secondary | ICD-10-CM | POA: Diagnosis not present

## 2019-01-03 DIAGNOSIS — Z5181 Encounter for therapeutic drug level monitoring: Secondary | ICD-10-CM

## 2019-01-03 MED ORDER — MAGNESIUM OXIDE 400 MG PO TABS: 400.0000 mg | ORAL_TABLET | Freq: Every day | ORAL | 5 refills | Status: DC

## 2019-01-03 NOTE — Progress Notes (Cosign Needed)
ILD PRO Registry  Purpose: To collect data and biological samples that will support future research studies. Registry will describe current approaches to diagnosis and treatment of ILD, analyze participant characteristics to describe the natural history of the disease, assess quality of life, describe participants interactions with the health care system, describe ILD treatment practices across multiple institutions, and utilize biological samples linked to well characterized ILD participants to identify disease biomarkers.   Clinical Research Coordinator / Research RN note : This visit for Subject Tasha Ballard with DOB: [birth year] on 01/03/2019 for the above protocol is Visit/Encounter # 12 month (Visit 2)  and is for purpose of research. Subject confirmed that there was NO change in contact information (e.g. address, telephone, email). Subject thanked for participation in research and contribution to science. All procedures were completed per the above mentioned study including blood work and questionnaires. Refer to the subject's paper source binder for further details.    Signed by  [place] Assistant [facility]  [place], [place] 3:05 PM 01/03/2019

## 2019-01-03 NOTE — Telephone Encounter (Signed)
Raquel Sarna  Please order pulm rehab. Ms STackhouse approved  Thanks    SIGNATURE    Dr. Brand Males, M.D., F.C.C.P,  Pulmonary and Critical Care Medicine Staff Physician, [place] Director - Interstitial Lung Disease  Program  Pulmonary [place] at [place], [place], [address]  Pager: [phone], If no answer or between  15:00h - 7:00h: call [phone] Telephone: [phone]  3:16 PM 01/03/2019

## 2019-01-03 NOTE — Progress Notes (Signed)
-Lung function November 08, 2017: FVC 0.9 L / 27%, DLCO 5.86/29%  - HRCT 11/08/17 Rt diaphragm elevation ? Eventration v paresis.  Esophagogram November 11, 2017 - > 11/11/17 Tasha Ballard - Normal  Dr. Threasa Ballard at [facility].She was started on  IOV 11/22/2017  Chief Complaint  Patient presents with   Consult    consut due to pulmonary fibrosis. Pt has been followed by [facility] and last PFT was peformed 8/14 at [facility]. Pt does have complaints of SOB with exertion which she states has become better due to rehab. Pt also has a dry hacking cough with mild production of mucus. Denies any CP.    Is seen by presents to the ILD clinic.  This is her first visit with [name].  History is obtained from her and review of the [place] where she is to follow-up with Dr. Hortencia Ballard.  Interstitial lung disease clinic integrated interstitial lung disease questionnaire: Symptoms: Shortness of breath for years.  Gradually worse.  Currently level 5 shortness of breath walking up stairs or walking up a hill with oxygen.  Level for shortness of breath for sweeping or vacuuming and level 3 shortness of breath for shopping or picking up things and doing laundry.  Level 3 shortness of breath or standing up from a chair or taking a shower making the bed and level 1 dyspnea for brushing teeth.  She also has a cough for 6 years which is getting worse.  On and off that is phlegm.  Occasional wheezing present there is associated fatigue.  She tells me that she had undiagnosed and unrecognized ILD for years.  This was then picked up at [facility] and then she got referred to [facility].  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  At this time she was placed on CellCept.  Then in 2016 she got somewhat worse and was placed on 2 L nasal cannula continuously.  In 2017 she tells me that she had a aortic aneurysm and this was  repaired at [facility].  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  And then for the last year and a half or so she has been on significantly worsening hypoxemia.  In the spring 2019 she is requiring 8 L at rest and 15 L at exertion.  Apparently Dr. Lauris Ballard then recommended Rituxan infusions which she got to 60 days apart with the last one being approximately in April of May 2019.  After that she is somewhat improved requiring only 9-10 L with exertion.  She has been attending pulmonary rehabilitation which has been helping.  She is obese and unable to lose weight although she is lost some.  She believes a lot of her oxygen needs might be related to deviated nasal septum/septal perforation.  She subsequently followed up with Dr. Ruthann Ballard at [facility] in the summer 2019.  I reviewed the notes.  Palliative care was recommended because of progressive respiratory failure.  She has never seen the transplant clinic at [facility] but she recommends that because of obesity and diaphragmatic dysfunction that she would not be a good candidate for transplant .  She was then frustrated with experience because she wants to fight the disease.  She then drove to [facility] and saw Dr. Elisabeth Ballard at [facility] 1 week ago.  She spent substantial number  of days there.  At this point in time she showed me an email from him that suggest he might have myositis-ILD but they would like to review her CT scans and pathology from [facility].  Apparently she had significant amount of blood work and other tests at [facility]  I do not have access to these records at this point.  She believes that she wants to undergo transplant.  She thinks that she would be able to have transplant in [place] despite a high BMI.  Currently in [place] she is somewhat stable attending pulmonary rehabilitation.  She is interested in pulmonary trials  Past  medical history  -Tested for sleep apnea at [facility] and was told to be negative.  But at [facility] in September 2019 they recommended a repeat sleep study -Denied all forms of connective tissue disease diagnosis and vasculitis.  Although at [facility] September 2019 when she was seen last week she was told she might have myositis-ILD but this is pending confirmation in the multidisciplinary case conference -She does report positive for diabetes and thyroid disease and mononucleosis in the past but denies any tuberculosis or kidney disease   Review of systems -Positive for fatigue for a few years, arthralgia for a few years but denies dysphagia.  Positive for dry eyes and mouth for few years.  No Raynaud.  No recurrent fever.  No weight loss.  No acid reflux but does admit to snoring for a few years.  No rash no ulcers.  Family history of lung disease COPD and a long but no pulmonary fibrosis.  Dad did have rheumatoid arthritis  Personal exposure history Denies tobacco, vaping, marijuana, cocaine, IV drugs  Home and hobby history  [place] which is 50 years old  Occupational history -She does stay in a condition spaces.  122 point occupational questionnaire completely negative  Medication history -She has a history of allergy to Macrobid/nitrofurantoin.  Do not know when she took it.  She is currently on immunosuppressive regimen of CellCept, Bactrim and prednisone.  CellCept since 2013 and prednisone since 2012.  She did get Rituxan x2 doses with the last one being in spring 2019.  She feels her hypoxemia improved somewhat after that.     has a past medical history of Aneurysm ([place]), Diabetes mellitus without complication ([place]), and Thyroid disease.   reports that she has never smoked. She has never used smokeless tobacco.   OV 12/21/2017  Subjective:  Patient ID: Tasha Ballard, female , DOB: 08/19/1970 , age 68 y.o. , MRN: [id number] ,  ADDRESS: [address] [place] [address]   12/21/2017 -   Chief Complaint  Patient presents with   Follow-up    ILD     HPI TYLESHA GIBEAULT 48 y.o. -returns for follow-up.  She brings her mother along who I am meeting for the first time.  This visit is specifically focused on evaluation from [place].  Yesterday I did speak to the interstitial lung disease specialist at [facility] Dr. Threasa Ballard.  He told me he was finally able to receive and review the surgical lung biopsy from [facility].  He and his pathologist agreed with the diagnosis of progressive NSIP.  His main recommendation was to switch patient to Cytoxan.  In addition also aggressively pursue weight loss even if it means for bariatric surgery approach so we can get ready for transplant.  He agreed the prognosis is very poor.  At this point in time patient compared to the last visit is stable.  She is using more than 10 L of oxygen up to 15 L of oxygen.  She is interested in Cytoxan.  Dr. Elisabeth Ballard indicated to me that efficacy wise oral and IV are similar but he felt IV would be a little bit safer from a urologic standpoint.  Patient is interested in weight loss.  She says she has had difficulty losing weight despite being hypocaloric.  She is interested in nutritional referral.  In addition she wants a sleep specialist referral.  She says [place] OSA was ruled out.  She has significant insomnia and daytime tiredness.  She said that [place] recommended sleep referral ASAP.  She is registered to participate in the ILD-pro registry study for progressive non-IPF disease.  This was done today.   Investigations at [facility] -November 09, 2017: Echocardiogram ejection fraction 70-75% with grade 2 diastolic dysfunction.  Right ventricle was mildly enlarged but global RV function was felt to be normal.  No pericardial effusion no obvious right-to-left shunt at rest or cough or Valsalva on agitated  saline contrast exam and stage I right ventricular diastolic dysfunction.  Pulmonary artery systolic pressure at 45 mmHg  -Lung function November 08, 2017: FVC 0.9 L / 27%, DLCO 5.86/29%  - HRCT 11/08/17 Rt diaphragm elevation ? Eventration v paresis. Sniff test recommendd  Esophagogram November 11, 2017 - > 11/11/17 Natil Jewsh - Normal    12/22/2017-telephone encounter - Dr. [name] >>>starting Cytoxan 50 mg/day >>>Avoid lots of fluids to avoid cystitis, avoid intercourse without contraception, return in 7 to 10 days to recheck CBC, Bmet, LFTs, UA  12/22/17-office visit-[place] >>>Plan in lab sleep study be ordered, patient will likely need BiPAP with oxygen  12/30/2017  - Visit   48 year old female patient presenting today for one-week follow-up visit after starting Cytoxan.  Patient also get lab work completed today.  Unfortunately patient did not get this completed prior to her office visit she will get afterwards.  Patient reports baseline symptoms of nonproductive cough.  Requiring 4 to 6 L of oxygen at rest as well as 10 to 15 L of oxygen with exertion.  Patient does have her baseline shortness of breath with exertion.  Patient reporting no worsening symptoms since starting Cytoxan.   Patient reports she typically is on 9 to 10 L of oxygen at home.   OV 01/17/2018  Subjective:  Patient ID: Tasha Ballard, female , DOB: [birth year] , age 68 y.o. , MRN: [id number] , ADDRESS: [address] [place] [id number]   01/17/2018 -   Chief Complaint  Patient presents with   Follow-up    2 week f/u, lung transplant, was contacted and waiting on weight loss to continue   PRogressive NSIP Chronic hypoxemic resp failure Morbid Obesity - needs weight loss for transplant Rx regimen   -  cytoxan 54m per day 12/23/17 and increaed to 1065mon 12/23/17.On11/19/19. increase to 12563mer day  And switched to IV cytoxan Q4 week cycle - 1st cycle 02/03/18.   HPI ChrNAIARA LOMBARDOZZI 57.o. -presents for follow-up with her husband.  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 on cytoxan 62m68mr day 12/23/17 and increaed to 100mg95m11/1/19.  On 01/10/18.  increase to 125mg 28mday .  Plan is to get blood work today.  She wants a Port-A-Cath placed.  She prefers IV Cytoxan.  I checked with Dr. Threasa Ballard and he feels either p.o. or IV Cytoxan is fine but in the balance IV Cytoxan has a slightly lower incidence of hemorrhagic cystitis and the patient prefers this.  At this point in time she tells me that the Cytoxan seems to be working well for her.  She is less hypoxemic she is able to get by with 5 L at rest.  On the other hand this could be because of weight loss.  However there is some fatigue.  She will have blood work and urine analysis to look for cytotoxicity from Cytoxan.  Based on the newINBUILD  data we discussed about starting nintedanib.  I checked with Dr. Threasa Ballard at [facility] and he is supportive of the plan.  Patient states that she has done extensive research on nintedanib and she is very excited about the possibility of taking this.   Obesity: Weight 231#  11/22/17  -> 221# 01/17/2018.,  She saw the nutritionist at [facility].  She is using an app and tracking her weight on a regular basis.  She is encouraged by the weight loss.  We gave her a low glycemic diet sheet.  She has not been able to see the bariatric surgeon at [facility] but I did run into Dr. Kaylyn Lim in the hallway and he said he would be happy to see her and counsel her.  Although he did acknowledge she would be high risk for surgery.  He is in favor of low carbohydrate diet.  A few weeks ago I met the transplant physician from [facility] and he brought up the idea about losing weight loss drugs.  His preferred drug is phentermine.  We discussed the  possibility about getting an endocrine consult  Lung transplant: [facility] has called her.  They want to BMI less than 30 or 36 I am not sure.  She is working towards his goal.  She has not heard from [facility].  I emailed them and the day after the visit patient is emailed me saying that she has heard from them.  She is very keen to get on the transplant list.    02/03/2018 OV for Direct Admit for IV Cytoxan  Patient presents to the office today for direct admission to [facility] for IV Cytoxan infusion.  She says her breathing is at baseline.  She remains on oxygen 4 to 6 L at rest and 10 to 15 L with activity.  He says her leg swelling is at baseline.  She remains on Lasix 40 mg daily and Aldactone 100 mg.  She is on chronic steroids with prednisone 10 mg daily. She has been on 05 x1 week.  Says she is tolerating with no nausea vomiting or diarrhea.  Does have some mild constipation.  Patient says she had a Port-A-Cath placed 1 week ago. Patient has chronic shortness of breath gets winded with minimal activity.  Has intermittent dry cough.   02/13/2018 Follow up : Progressive NSIP , O2 RF  Patient returns for a one-week follow-up.  Patient was recently admitted for IV Cytoxan under observation status.. Patient had been on oral Cytoxan.  Continue to have progressive worsening of her ILD.  At baseline she is on 4 to 6 L at rest.  And 10 to 15 L with activity.  She is on Aldactone and Lasix.  She has been started on  OFEV 3 weeks ago.  She is tolerating well with no nausea vomiting or diarrhea.  Does have some mild constipation. Since her Cytoxan infusion.  She says she has done well.  She had minimal nausea.  She does feel that her breathing has slightly improved.  Her oxygen demands are decreased.  She is using mainly only 6 L of oxygen.  And doing well with this with good O2 saturations. Says she was able to actually go out to dinner for the first time in a long  time.  She denies any hematuria, chest pain, orthopnea or increased leg swelling.  She is working on weight loss.  She is going to the weight loss center. Labs were done on 12/20 that showed normal [place] blood cell count, platelets and kidney function. Urinalysis was done today.  And is pending.. Plans are for a Cytoxan infusion in 3- 4 weeks from her last infusion.  OV 03/02/2018  Subjective:  Patient ID: Tasha Ballard, female , DOB: 1970/05/18 , age 42 y.o. , MRN: [id number] , ADDRESS: [place] [id number]   03/02/2018 -   Chief Complaint  Patient presents with   Follow-up    Appt prior to pt's next Cytoxan infusion.  Pt states she is doing well since last visit. States she can tell that her breathing has improved. Has had some nausea/vomiting and wonders if it is from the [place].    HPI ONESHA KREBBS 47 y.o. -presents for the above issues.  I personally saw her before Thanksgiving 2019.  After that she is seen by nurse practitioner under my supervision as documented above for Cytoxan infusion.  She has had post Cytoxan infusion labs which showed her [place] count is decreasing but still adequate.  Most recent labs was February 13, 2018 at the weight loss clinic with a Grimmer count of 5900.  Rest of the labs reviewed and appear adequate and normal.  Including liver function test.  She is developed new onset of intermittent nausea and vomiting ever since starting nintedanib early December 2019.  This particularly happens with the Christmas meals.  She says this appears at random and is mild and is treated with Zofran.  Given the beneficial effects of nintedanib and progressive non-IPF ILD she is agreed to continue to take this.  She continues on her chronic prednisone and Bactrim prophylaxis.  She is finished 1 cycle of IV Cytoxan February 03, 2018.  She has upcoming second cycle 4 weeks apart on March 06, 2018.  At this point in time she is feeling well.  She feels her ILD has  actually improved.  On 2 L at rest she was saturating at 98%.  This is a huge improvement for her.  However when we walked her she still needed 8 L of oxygen with exertion.  She still says this is an improvement because in the past she was using more than 10 L for exertion.  Today my nurse practitioner will enter the orders for upcoming infusion.  There is no fever.  Morbid obesity: She has seen Dr. Leafy Ro at the weight loss clinic.  I reviewed the notes.  Dietary measures are indicated.  I have written to Dr. Leafy Ro asking about medications to accelerate weight loss given the fact her lung disease is poor prognosis and she is an urgent need for lung transplant and with weight loss being her main barrier.   Obesity: Weight 231#  11/22/17  -> 221# 01/17/2018., -> 222# on  03/02/2018    OV 03/30/2018  Subjective:  Patient ID: Tasha Ballard, female , DOB: [birth year] , age 68 y.o. , MRN: [id number] , ADDRESS: [place] [id number]   03/30/2018 -   Chief Complaint  Patient presents with   Follow-up    PFT peformed today.  Pt states she has been doing okay since last visit.      HPI LACRISHA BIELICKI 48 y.o. -presents for follow-up of the above issues.  Her husband is here with her.  In terms of - Obesity: Weight 231#  11/22/17  -> 221# 01/17/2018., -> 222# on 03/02/2018 -> 220# on 03/30/2018  In terms of her progressive NSIP and chronic hypoxemic respiratory failure: She has finished 2 cycles of Cytoxan.  Second cycle was a full dose.  After that she got neutropenic as reviewed on the labs.  During this time she developed a facial rash.  This was on a weekend and over the phone we treated her with doxycycline.  It is largely improved.  Tomorrow she has an appointment with her dermatologist.  I do not know who it is.  Overall she is feeling stable in terms of her IPF.  Her pulmonary function test compared to [facility] shows some improvement.  In fact compared to [place] it is  even better.  She attributes this to both nintedanib and Cytoxan.  She is using 4 L of oxygen at rest and 8 L with exertion.  Her symptom score is documented below with shortness of breath at 21-22.  She always has a lot of fatigue.  In terms of her medicine tolerance particularly with nintedanib she had intermittent nausea and vomiting particularly after eating certain types of foods.  She is managing this with Zofran.  Symptom score is documented below.  Her most recent cycle of Cytoxan was March 06, 2018.  Her next Cytoxan is April 03, 2018.  I discussed with Dr. Julien Nordmann oncologist was a lot of experience with Cytoxan.  He said is okay to go with full dose if I felt so.  Patient herself is very keen on getting a full dose of Cytoxan.  She did not have neutropenic fever although she got neutropenic with the second cycle.     ROS  OV 04/26/2018  Subjective:  Patient ID: Tasha Ballard, female , DOB: 1970/11/04 , age 63 y.o. , MRN: [id number] , ADDRESS: [address] [place] [address]   04/26/2018 -   Chief Complaint  Patient presents with   Follow-up    Pt states she currently is not feeling too well and believes she might have sinus infection. Pt has had complaints of fatigue, has head congestion, pain in left ear going down to her neck and also has had a sore throat.       HPI YITTA GONGAWARE 48 y.o. - presents for above issues. Here with ? Mom.Since last visit had blood woirk last week and wbc nadir. Had associatd "usual fatigue of low wbc". But then father in law died and she was busy so never recovered from fatigue. Has new wheeze, sinus congestion and increased cruds. Feels she has sinusitis. Wants abx before cycle #4 cytoxan next week. She is worried about COVID-19 ans asked for advice. Otherwise well. Symptom score below      OV 10/20/2018  Subjective:  Patient ID: Tasha Ballard, female , DOB: 02/28/1970 , age 89 y.o. , MRN: [id number] , ADDRESS: [place]  [place] [address]     10/20/2018 -   Chief Complaint  Patient presents with   ILD (interstitial lung disease) ([place])     HPI MAEOLA MCHANEY 48 y.o. -presents for follow-up of the above issues.  She reports that over summer 2020 her husband nearly got exposed to someone with COVID this was in the outdoor setting.  She never ended up getting COVID.  On October 22, 2018 she tested negative for COVID. On October 04, 2018 she had a virtual visit with nurse practitioner for sinus complaints.  She took a 10-day course of Augmentin.  She was taking this along with Bactrim despite instructions to hold Bactrim.  This gave her immense diarrhea.  She is also reporting that the diarrhea has now improved after finishing the Augmentin course.  However she is having fatigue for the last 1 month.  She feels this because of the oral Cytoxan.  She wants to switch to IV Cytoxan.  She is in need of a flu shot and is willing to have that.  Her Port-A-Cath site is fine.  Currently she is on prednisone, oral Cytoxan, Bactrim prophylaxis and nintedanib.  She maintains herself on 6 L.  Her symptom score and oxygen needs continue to be stable.  Obesity continues to be a barrier for transplant.  In terms of her ILD her last PFT was in February 2020 and CT scan was in  Nov 2019.      OV 01/03/2019  Subjective:  Patient ID: Tasha Ballard, female , DOB: 09/12/1970 , age 55 y.o. , MRN: [id number] , ADDRESS: [place] [id number]   #Morbid Obesity - needs weight loss for transplant - sees Dr Janeal Holmes since dec 2019 #PRogressive NSIP #Chronic hypoxemic resp failure - 4L at rest, 8L with exertion # High risk treatment regimen  -CellCept 2013 through 2019  -Chronic prednisone 2013 -currently taking -Bactrim prophylaxis 2013 --currently taking  - Cytoxan Oral  53m per day 12/23/17 and increaed to 1026mon 12/23/17.On11/19/19. increase to 12519mer day   - Cytoxan IV : And switched to IV cytoxan Q4 week cycle    - 1st cycle 02/03/18 (s/p portocath 01/26/18)  = 2nd cycle 03/06/2018  - 3rd cycle  -  04/03/2018  - 4th cycle - 05/01/2018 - Cytoxan oral (due to pandemic covid-19 and need to avoid infusion visits) - spring 2020 - BAck on IV Cytoxan -   - 5th cycle - end sept 2020  - 6th cycle - end oct 2020   - Ofev 01/23/18 (approximnate)  - Lung transplant needed: Obesity Barrier :  Weight 231#  11/22/17  -> 221# 01/17/2018., -> 222# on 03/02/2018 -> 220# on 03/30/2018 -> 223# on 04/26/2018 -> 224# on 10/20/2018 -> 219# on 01/03/2019   01/03/2019 -   Chief Complaint  Patient presents with   Follow-up    Pt states she has been doing good since last visit and states breathing has been doing well. Pt is doing 6L O2 sitting and 8-10L with exertion.     HPI ChrJOETTE SCHMOKER 63o. -presents for follow-up of the above medical problems.  Last visit was in end of August 2020.  At that point in time we decided to restart her IV Cytoxan because she did not want to do the oral Cytoxan in the midst of the pandemic because this was making her fatigued and nauseated more.  She is now finished 2 cycles of IV Cytoxan both of the  lower dose.  The first cycle was end of September 2020 but after that it looks like she developed some UTI symptoms although the culture did not grow anything.  We gave her antibiotics.  Therefore the second cycle was also at the lower dose.  She is willing to go up to the third cycle.  She feels her quality of life is much better with the IV Cytoxan cyclical regimen.  At this point in time she is finished 1 year of Cytoxan and also 1 year of nintedanib.  Overall she feels stable.  She uses oxygen with rest and exertion fluctuating between 6 and 8 L.  She feels a combination of the 2 has helped although she thinks maybe the nintedanib has helped her more.  She wants to have a repeat Pneumovax today.  She was asking about the Shingrix vaccine which is a live attenuated vaccine.  Her last blood draw was  December 29, 2018 with [name] and this was reviewed and is normal although the potassium is 3.5 and magnesium is 1.8 which is slightly below target.  One of her questions was about how long to continue the Cytoxan regimen.  I will reach out to [facility] for this.  In addition she feels she is hit menopause because she has been having a lot of night sweats.  She is been started on Premarin.  She is also lost some weight.  She continues prednisone at 10 mg/day.  Her symptom scores below shows stability.  Her lung function shows stability.  CT scan shows presence of fibrosis but this no mention about progression.     SYMPTOM SCALE - ILD 03/30/2018  04/26/2018  10/20/2018  01/03/2019   O2 use  4 L at rest, 8 L with exertion Same but prefers 6L at all times 6L  6L at rest, 8-10 L with exertion  Shortness of Breath 0 -> 5 scale with 5 being worst (score 6 If unable to do)     At rest 0 1 0 0  Simple tasks - showers, clothes change, eating, shaving 1 3.5 3.5 3.5  Household (dishes, doing bed, laundry) 2.5 4.5 3.5 4  Shopping 3 4.5 3.5 4  Walking level at own pace 3 3.5 3.5 3.5  Walking keeping up with others of same age _0 4.5  Walking up Stairs 4 5 4.5 5  Walking up Hill 4 5 4.5 5  Total (40 - 48) Dyspnea Score 21.5 32 28 29.5  How bad is your cough? x 2.5 x mild  How bad is your fatigue 5 ("very, lol" 4 Extreme 5+ "lot"   Results for Riano, SHYNIA DALEO" (MRN [id number]) as of 01/03/2019 10:52  Ref. Range 03/30/2018 11:50 11/20/2018 15:14  FVC-Pre Latest Units: L 1.09 1.16  FVC-%Pred-Pre Latest Units: % 32 35  Results for Befort, Marella Y "[name]" (MRN [id number]) as of 01/03/2019 10:52  Ref. Range 03/30/2018 11:50 11/20/2018 15:14  DLCO unc Latest Units: ml/min/mmHg 6.85 7.29  DLCO unc % pred Latest Units: % 34 37     CT CHest High Resolution 11/20/2018  IMPRESSION: 1. The appearance of the lungs is considered probable usual interstitial pneumonia (UIP) per current ATS  guidelines. Repeat high-resolution chest CT is recommended in 12 months to assess for temporal changes in the appearance of the lung parenchyma. 2. Aortic atherosclerosis.  Aortic Atherosclerosis (ICD10-I70.0).   Electronically Signed   By: Vinnie Langton M.D.   On: 11/20/2018 14:16  LABS    PULMONARY No results for input(s): PHART, PCO2ART, PO2ART, HCO3, TCO2, O2SAT in the last 168 hours.  Invalid input(s): PCO2, PO2  CBC Recent Labs  Lab 12/29/18 1007  HGB 12.4  HCT 39.1  WBC 5.8  PLT 232.0    COAGULATION No results for input(s): INR in the last 168 hours.  CARDIAC  No results for input(s): TROPONINI in the last 168 hours. No results for input(s): PROBNP in the last 168 hours.   CHEMISTRY Recent Labs  Lab 12/29/18 1007  NA 136  K 3.5  CL 94*  CO2 34*  GLUCOSE 146*  BUN 13  CREATININE 0.85  CALCIUM 9.3  MG 1.8  PHOS 4.1   Estimated Creatinine Clearance: 87.3 mL/min (by C-G formula based on SCr of 0.85 mg/dL).   LIVER No results for input(s): AST, ALT, ALKPHOS, BILITOT, PROT, ALBUMIN, INR in the last 168 hours.   INFECTIOUS No results for input(s): LATICACIDVEN, PROCALCITON in the last 168 hours.   ENDOCRINE CBG (last 3)  No results for input(s): GLUCAP in the last 72 hours.   Urine dipstick shows - NORMAL     IMAGING x48h  - image(s) personally visualized  -   highlighted in bold No results found.   ROS - per HPI     has a past medical history of Aneurysm ([place]), Back pain, Diabetes mellitus without complication ([place]), H/O blood clots, Hypertension, Joint pain, Pulmonary fibrosis (HCC), SOB (shortness of breath), Swelling, Thyroid disease, and Vitamin D deficiency.   reports that she has never smoked. She has never used smokeless tobacco.  Past Surgical History:  Procedure Laterality Date   ABLATION     IR IMAGING GUIDED PORT INSERTION  01/26/2018   KNEE SURGERY     LUNG BIOPSY      Allergies  Allergen  Reactions   Macrobid [Nitrofurantoin Macrocrystal] Nausea And Vomiting   Adhesive [Tape] Rash    Immunization History  Administered Date(s) Administered   Influenza,inj,Quad PF,6+ Mos 12/24/2014, 02/05/2016, 01/07/2017, 11/22/2017, 10/20/2018   Influenza-Unspecified 12/24/2014, 02/05/2016, 01/07/2017, 11/22/2017   Pneumococcal Conjugate-13 07/20/2017   Pneumococcal Polysaccharide-23 06/09/2012   Tdap 02/19/2011    Family History  Problem Relation Age of Onset   Heart failure Mother    Heart disease Mother    Rheumatologic disease Father      Current Outpatient Medications:    benzonatate (TESSALON) 200 MG capsule, Take 1 capsule (200 mg total) by mouth 3 (three) times daily as needed for cough., Disp: 30 capsule, Rfl: 1   carvedilol (COREG) 6.25 MG tablet, Take 6.25 mg by mouth 2 (two) times daily with a meal., Disp: , Rfl:    clindamycin (CLEOCIN T) 1 % lotion, , Disp: , Rfl:    furosemide (LASIX) 40 MG tablet, Take 40 mg by mouth See admin instructions. Take 1 tablet (40 mg) daily, may repeat dose if needed for fluid retention., Disp: , Rfl:    gabapentin (NEURONTIN) 100 MG capsule, Take 1 capsule by mouth as needed., Disp: , Rfl:    metFORMIN (GLUCOPHAGE) 500 MG tablet, Take 500 mg by mouth 2 (two) times daily. , Disp: , Rfl:    montelukast (SINGULAIR) 10 MG tablet, Take 10 mg by mouth daily after supper., Disp: , Rfl:    OFEV 150 MG CAPS, Take 150 mg by mouth 2 (two) times daily., Disp: , Rfl: 11   ondansetron (ZOFRAN) 8 MG tablet, TAKE 1 TABLET BY MOUTH EVERY 8 HOURS AS NEEDED FOR NAUSEA &  VOMITING, Disp: 45 tablet, Rfl: 0   potassium chloride (K-DUR) 10 MEQ tablet, Take 2 tablets (20 mEq total) by mouth 2 (two) times daily., Disp: 120 tablet, Rfl: 2   predniSONE (DELTASONE) 10 MG tablet, Take 10-5m as needed for SOB, Disp: 30 tablet, Rfl: 3   PREMARIN 0.625 MG tablet, Take 0.625 mg by mouth daily., Disp: , Rfl:    PROAIR HFA 108 (90 Base) MCG/ACT  inhaler, Inhale 2 puffs into the lungs every 6 (six) hours as needed for wheezing or shortness of breath., Disp: , Rfl: 4   saccharomyces boulardii (FLORASTOR) 250 MG capsule, Take 1 capsule (250 mg total) by mouth 2 (two) times daily., Disp: 60 capsule, Rfl: 0   spironolactone (ALDACTONE) 100 MG tablet, Take 100 mg by mouth daily., Disp: , Rfl:    sulfamethoxazole-trimethoprim (BACTRIM) 400-80 MG tablet, TAKE 1 TABLET BY MOUTH ONCE DAILY, Disp: 90 tablet, Rfl: 1   thyroid (ARMOUR) 90 MG tablet, Take 90 mg by mouth daily before breakfast. , Disp: , Rfl:    Vitamin D, Ergocalciferol, (DRISDOL) 1.25 MG (50000 UT) CAPS capsule, TAKE 1 CAPSULE BY MOUTH ONCE WEEKLY, Disp: 3 capsule, Rfl: 0   cyclophosphamide (CYTOXAN) 50 MG capsule, Take 2 capsules (100 mg total) by mouth daily. Give on an empty stomach 1 hour before or 2 hours after meals., Disp: 60 capsule, Rfl: 2      Objective:   Vitals:   01/03/19 1030  BP: 126/70  Pulse: 95  SpO2: 98%  Weight: 219 lb (99.3 kg)  Height: _0  (1.575 m)    Estimated body mass index is 40.06 kg/m as calculated from the following:   Height as of this encounter: _1  (1.575 m).   Weight as of this encounter: 219 lb (99.3 kg).  _2 @  [facility]  01/03/19 1030  Weight: 219 lb (99.3 kg)     Physical Exam  General Appearance:    Alert, cooperative, no distress, appears stated age - yes , Deconditioned looking - no , OBESE  - yes, Sitting on Wheelchair -  no  Head:    Normocephalic, without obvious abnormality, atraumatic  Eyes:    PERRL, conjunctiva/corneas clear,  Ears:    Normal TM's and external ear canals, both ears  Nose:   Nares normal, septum midline, mucosa normal, no drainage    or sinus tenderness. OXYGEN ON  - yes . Patient is @ 6:   Throat:   Lips, mucosa, and tongue normal; teeth and gums normal. Cyanosis on lips - no  Neck:   Supple, symmetrical, trachea midline, no adenopathy;    thyroid:  no  enlargement/tenderness/nodules; no carotid   bruit or JVD  Back:     Symmetric, no curvature, ROM normal, no CVA tenderness  Lungs:     Distress - no , Wheeze no, Barrell Chest - no, Purse lip breathing - no, Crackles - only very faint at base   Chest Wall:    No tenderness or deformity.    Heart:    Regular rate and rhythm, S1 and S2 normal, no rub   or gallop, Murmur - no  Breast Exam:    NOT DONE  Abdomen:     Soft, non-tender, bowel sounds active all four quadrants,    no masses, no organomegaly. Visceral obesity - yes  Genitalia:   NOT DONE  Rectal:   NOT DONE  Extremities:   Extremities - normal, Has Cane - no, Clubbing - no, Edema -  no  Pulses:   2+ and symmetric all extremities  Skin:   Stigmata of Connective Tissue Disease - no  Lymph nodes:   Cervical, supraclavicular, and axillary nodes normal  Psychiatric:  Neurologic:   Pleasant - yes, Anxious - no, Flat affect - no  CAm-ICU - neg, Alert and Oriented x 3 - yes, Moves all 4s - yes, Speech - normal, Cognition - intact           Assessment:       ICD-10-CM   1. ILD (interstitial lung disease) ([place])  J84.9   2. NSIP (nonspecific interstitial pneumonia) ([place])  J84.89   3. Chronic respiratory failure with hypoxia (HCC)  J96.11   4. High risk medication use  Z79.899   5. Therapeutic drug monitoring  Z51.81   6. Hypomagnesemia  E83.42   7. Hypokalemia  E87.6   8. Vaccine counseling  Z71.89   9. Research subject  Z00.6        Plan:     Patient Instructions  ILD (interstitial lung disease) ([place]) NSIP (nonspecific interstitial pneumonia) ([place]) Chronic respiratory failure with hypoxia ([place])  -Clinically stable both on CT scan and pulmonary function test -I think combination of Cytoxan and nintedanib is helping you  Plan  -Continue IV Cytoxan with the next cycle being around Thanksgiving 2020 [email me a few days before and we can see depending on how we do about increasing the dose) -Continue daily prednisone at  a minimum 10 mg/day -Continue 3 times a week Bactrim -refills to be sent today -Continue oxygen 6 L at rest and up to 8-10 L with exertion -Pneumonia vaccine today biister -I have written to Doreatha Martin her pulmonary rehab about getting her back into pulmonary rehabilitation -Congratulations on the weight loss.  Continue to lose weight as a goal towards getting a lung transplant  High risk medication use Therapeutic drug monitoring  -Most recent lab work including urine analysis on December 29, 2018 is normal -We discussed the long-term implications about taking Cytoxan -I have written to [facility]re[facility] at [facility] about how long to continue Cytoxan -I do not see any issue getting primary in the context of nintedanib   Hypomagnesemia -1.8 mg Hypokalemia -3.5  -Start magnesium oxide 400 mg once daily -Continue potassium at 40 mEq twice daily (hopefully correcting the magnesium can improve the potassium]  Vaccine counseling  -Pneumovax repeat dose today - Please talk to PCP [name], MD -  and ensure you get  shingrix ([place]) inactivated vaccine against shingles   -Research subject  -Meet with research coordinator to do the ILD-Pro research visit today  Followup - keep infusion schedule  - 2-3 months for ILD clinic  > 50% of this > 40 min visit spent in face to face counseling or/and coordination of care - by this undersigned MD - Dr Brand Males. This includes one or more of the following documented above: discussion of test results, diagnostic or treatment recommendations, prognosis, risks and benefits of management options, instructions, education, compliance or risk-factor reduction Performed  SIGNATURE    Dr. Brand Males, M.D., F.C.C.P,  Pulmonary and Critical Care Medicine Staff Physician, [place] Director - Interstitial Lung Disease  Program  Pulmonary [facility] [place], [place], [address]  Pager: [phone], If no answer or between  15:00h - 7:00h: call [phone] Telephone: [phone]  11:27 AM 01/03/2019

## 2019-01-03 NOTE — Patient Instructions (Signed)
ILD (interstitial lung disease) (HCC) NSIP (nonspecific interstitial pneumonia) (HCC) Chronic respiratory failure with hypoxia (HCC)  -Clinically stable both on CT scan and pulmonary function test -I think combination of Cytoxan and nintedanib is helping you  Plan  -Continue IV Cytoxan with the next cycle being around Thanksgiving 2020 [email me a few days before and we can see depending on how we do about increasing the dose) -Continue daily prednisone at a minimum 10 mg/day -Continue 3 times a week Bactrim -refills to be sent today -Continue oxygen 6 L at rest and up to 8-10 L with exertion -Pneumonia vaccine today biister -I have written to Doreatha Martin her pulmonary rehab about getting her back into pulmonary rehabilitation -Congratulations on the weight loss.  Continue to lose weight as a goal towards getting a lung transplant  High risk medication use Therapeutic drug monitoring  -Most recent lab work including urine analysis on December 29, 2018 is normal -We discussed the long-term implications about taking Cytoxan -I have written to Dr. Threasa Alpha at [facility] about how long to continue Cytoxan -I do not see any issue getting primary in the context of nintedanib   Hypomagnesemia -1.8 mg Hypokalemia -3.5  -Start magnesium oxide 400 mg once daily -Continue potassium at 40 mEq twice daily (hopefully correcting the magnesium can improve the potassium]  Vaccine counseling  -Pneumovax repeat dose today - Please talk to PCP [name], MD -  and ensure you get  shingrix ([place]) inactivated vaccine against shingles   -Research subject  -Meet with research coordinator to do the ILD-Pro research visit today  Followup - keep infusion schedule  - 2-3 months for ILD clinic

## 2019-01-03 NOTE — Telephone Encounter (Signed)
Order placed. Nothing further needed. 

## 2019-01-04 ENCOUNTER — Encounter (HOSPITAL_COMMUNITY): Payer: Self-pay | Admitting: *Deleted

## 2019-01-04 ENCOUNTER — Telehealth: Payer: Self-pay | Admitting: Internal Medicine

## 2019-01-04 NOTE — Telephone Encounter (Signed)
Called number listed and msg states number is currently not receiving calls  Will try again 01/04/19

## 2019-01-04 NOTE — Progress Notes (Signed)
Received referral from Dr. [name] for this pt to participate in Pulmonary Rehab with the diagnosis of ILD.  Tasha Ballard is well known to the pulmonary rehab staff from prior participation.  Dr. Nelda Marseille, medical director for pulmonary rehab approved her participation with high liter flow of oxygen on exertion - max 10L.  Clinical review of pt follow up appt on 11/11 Pulmonary office note.   Pt appropriate for scheduling for Pulmonary rehab.  Will forward to support staff for scheduling and verification of insurance eligibility/benefits with pt  consent. Cherre Huger, BSN Cardiac and Training and development officer

## 2019-01-05 ENCOUNTER — Telehealth (HOSPITAL_COMMUNITY): Payer: Self-pay

## 2019-01-05 NOTE — Telephone Encounter (Signed)
Pt insurance is active and benefits verified through [place]. Co-pay $0.00, DED $0.00/$0.00 met, out of pocket $2,700.00/$2,700.00 met, co-insurance 30%. No pre-authorization required. Lattie Haw./[employer], 01/05/2019 @ 349PM, XIH#[id number]  Will pass to PR staff for scheduling.

## 2019-01-05 NOTE — Telephone Encounter (Signed)
I called and received the same message. The phone is not receiving call right now.

## 2019-01-08 ENCOUNTER — Telehealth (HOSPITAL_COMMUNITY): Payer: Self-pay

## 2019-01-08 NOTE — Telephone Encounter (Signed)
Tasha Ballard (Key: [id number])  Your information has been submitted to [place]. [employer] will review the request and fax you a determination directly, typically within 3 business days of your submission once all necessary information is received.  If [employer] [place] has not responded in 3 business days or if you have any questions about your submission, contact [place] at [phone].  PA initiated.

## 2019-01-08 NOTE — Telephone Encounter (Signed)
Attempted to contact number, recording states the caller is not receiving calls.   Called [phone] ([facility] spoke with [place], she directed me to contact this number [phone]. Called number provided and the line just rings busy.   Looked up another number on internet [phone], spoke with [place].   Per Silva Bandy per the documentation they faxed over a PA. I made her aware I do not see any documentation in chart the fax was received. Silva Bandy is going to document all this and have Lorriane Shire give [name] a call back.

## 2019-01-10 NOTE — Telephone Encounter (Signed)
Checked Cover My Meds. Pt's PA has been approved 01/08/2019 - 01/07/2020.

## 2019-01-11 ENCOUNTER — Other Ambulatory Visit: Payer: Self-pay

## 2019-01-11 ENCOUNTER — Encounter (HOSPITAL_COMMUNITY)
Admission: RE | Admit: 2019-01-11 | Discharge: 2019-01-11 | Disposition: A | Discharge status: Home / Self Care | Payer: BC Managed Care – PPO | Source: Ambulatory Visit | Attending: Internal Medicine | Admitting: Internal Medicine

## 2019-01-11 VITALS — BP 124/80 | HR 108 | Ht 62.0 in | Wt 224.9 lb

## 2019-01-11 DIAGNOSIS — J84112 Idiopathic pulmonary fibrosis: Secondary | ICD-10-CM | POA: Diagnosis present

## 2019-01-11 DIAGNOSIS — J849 Interstitial pulmonary disease, unspecified: Secondary | ICD-10-CM

## 2019-01-11 NOTE — Progress Notes (Signed)
Tasha Ballard 48 y.o. female Pulmonary Rehab Orientation Note Patient arrived today in Cardiac and Pulmonary Rehab for orientation and walk test to Pulmonary Rehab. She walked from the parking garage with oxygen @ 10 L. She does carry portable oxygen. Per pt, she uses oxygen continuously. Color good, skin warm and dry. Patient is oriented to time and place. Patient's medical history, psychosocial health, and medications reviewed. Psychosocial assessment reveals pt lives with their family. Pt is currently full time job, she owns a Restaurant manager, fast food. 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 Pt reports her stress level is moderate. Areas of stress/anxiety include Health. She would like to get a lung transplant, but needs to lose to 180 pounds, she presently is 224 pounds. Pt does not exhibit signs of depression.  PHQ2/9 score 0/6. Pt shows good  coping skills with not done outlook .  Offered emotional support and reassurance. Will continue to monitor and evaluate progress toward psychosocial goal(s) of no barriers to participation in pulmonary rehab. Physical assessment reveals heart rate is normal, breath sounds clear to auscultation, no wheezes, rales, or rhonchi. Grip strength equal, strong. Patient reports she does take medications as prescribed. Patient states she follows a Regular diet. The patient reports no specific efforts to gain or lose weight.. Patient's weight will be monitored closely. Demonstration and practice of PLB using pulse oximeter. Patient able to return demonstration satisfactorily. Safety and hand hygiene in the exercise area reviewed with patient. Patient voices understanding of the information reviewed. Department expectations discussed with patient and achievable goals were set. The patient shows enthusiasm about attending the program and we look forward to working with this nice lady. The patient completed a 6 min walk test today and to begin exercise on  Tuesday, January 16, 2019 in the 1045 class.  [name]

## 2019-01-11 NOTE — Progress Notes (Signed)
Pulmonary Individual Treatment Plan  Patient Details  Name: Tasha Ballard MRN: [id number] Date of Birth: Nov 05, 1970 Referring Provider:     Pulmonary Rehab Walk Test from 01/11/2019 in [place]  Referring Provider  Dr. [name]      Initial Encounter Date:    Pulmonary Rehab Walk Test from 01/11/2019 in [place]  Date  01/11/19      Visit Diagnosis: Interstitial lung disease ([place])  Patient's Home Medications on Admission:   Current Outpatient Medications:  .  benzonatate (TESSALON) 200 MG capsule, Take 1 capsule (200 mg total) by mouth 3 (three) times daily as needed for cough., Disp: 30 capsule, Rfl: 1 .  carvedilol (COREG) 6.25 MG tablet, Take 6.25 mg by mouth 2 (two) times daily with a meal., Disp: , Rfl:  .  furosemide (LASIX) 40 MG tablet, Take 40 mg by mouth See admin instructions. Take 1 tablet (40 mg) daily, may repeat dose if needed for fluid retention., Disp: , Rfl:  .  magnesium oxide (MAG-OX) 400 MG tablet, Take 1 tablet (400 mg total) by mouth daily., Disp: 30 tablet, Rfl: 5 .  metFORMIN (GLUCOPHAGE) 500 MG tablet, Take 500 mg by mouth 2 (two) times daily. , Disp: , Rfl:  .  montelukast (SINGULAIR) 10 MG tablet, Take 10 mg by mouth daily after supper., Disp: , Rfl:  .  OFEV 150 MG CAPS, Take 150 mg by mouth 2 (two) times daily., Disp: , Rfl: 11 .  ondansetron (ZOFRAN) 8 MG tablet, TAKE 1 TABLET BY MOUTH EVERY 8 HOURS AS NEEDED FOR NAUSEA AND VOMITING, Disp: 45 tablet, Rfl: 2 .  potassium chloride (K-DUR) 10 MEQ tablet, Take 2 tablets (20 mEq total) by mouth 2 (two) times daily., Disp: 120 tablet, Rfl: 2 .  predniSONE (DELTASONE) 10 MG tablet, Take 10-63m as needed for SOB, Disp: 30 tablet, Rfl: 3 .  PREMARIN 0.625 MG tablet, Take 0.625 mg by mouth daily., Disp: , Rfl:  .  spironolactone (ALDACTONE) 100 MG tablet, Take 100 mg by mouth daily., Disp: , Rfl:  .  sulfamethoxazole-trimethoprim (BACTRIM)  400-80 MG tablet, TAKE 1 TABLET BY MOUTH ONCE DAILY, Disp: 90 tablet, Rfl: 1 .  thyroid (ARMOUR) 90 MG tablet, Take 90 mg by mouth daily before breakfast. , Disp: , Rfl:  .  Vitamin D, Ergocalciferol, (DRISDOL) 1.25 MG (50000 UT) CAPS capsule, TAKE 1 CAPSULE BY MOUTH ONCE WEEKLY, Disp: 3 capsule, Rfl: 0 .  clindamycin (CLEOCIN T) 1 % lotion, , Disp: , Rfl:  .  cyclophosphamide (CYTOXAN) 50 MG capsule, Take 2 capsules (100 mg total) by mouth daily. Give on an empty stomach 1 hour before or 2 hours after meals. (Patient not taking: Reported on 01/11/2019), Disp: 60 capsule, Rfl: 2 .  gabapentin (NEURONTIN) 100 MG capsule, Take 1 capsule by mouth as needed., Disp: , Rfl:  .  PROAIR HFA 108 (90 Base) MCG/ACT inhaler, Inhale 2 puffs into the lungs every 6 (six) hours as needed for wheezing or shortness of breath., Disp: , Rfl: 4 .  saccharomyces boulardii (FLORASTOR) 250 MG capsule, Take 1 capsule (250 mg total) by mouth 2 (two) times daily. (Patient not taking: Reported on 01/11/2019), Disp: 60 capsule, Rfl: 0  Past Medical History: Past Medical History:  Diagnosis Date  . Aneurysm ([place]   . Back pain   . Diabetes mellitus without complication ([place]   . H/O blood clots   . Hypertension   . Joint pain   .  Pulmonary fibrosis ([place])   . SOB (shortness of breath)   . Swelling   . Thyroid disease   . Vitamin D deficiency     Tobacco Use: Social History   Tobacco Use  Smoking Status Never Smoker  Smokeless Tobacco Never Used    Labs: Recent Review Scientist, physiological    Labs for ITP Cardiac and Pulmonary Rehab Latest Ref Rng & Units 02/13/2018   Cholestrol 100 - 199 mg/dL 295(H)   LDLCALC 0 - 99 mg/dL 186(H)   HDL >39 mg/dL 85   Trlycerides 0 - 149 mg/dL 120   Hemoglobin A1c 4.8 - 5.6 % 5.6      Capillary Blood Glucose: Lab Results  Component Value Date   GLUCAP 115 (H) 01/26/2018   GLUCAP 162 (H) 09/01/2017     Pulmonary Assessment Scores: Pulmonary Assessment Scores    Row  Name 01/11/19 1555         ADL UCSD   ADL Phase  Entry     SOB Score total  64       CAT Score   CAT Score  25       UCSD: Self-administered rating of dyspnea associated with activities of daily living (ADLs) 6-point scale (0 = "not at all" to 5 = "maximal or unable to do because of breathlessness")  Scoring Scores range from 0 to 120.  Minimally important difference is 5 units  CAT: CAT can identify the health impairment of COPD patients and is better correlated with disease progression.  CAT has a scoring range of zero to 40. The CAT score is classified into four groups of low (less than 10), medium (10 - 20), high (21-30) and very high (31-40) based on the impact level of disease on health status. A CAT score over 10 suggests significant symptoms.  A worsening CAT score could be explained by an exacerbation, poor medication adherence, poor inhaler technique, or progression of COPD or comorbid conditions.  CAT MCID is 2 points  mMRC: mMRC (Modified Medical Research Council) Dyspnea Scale is used to assess the degree of baseline functional disability in patients of respiratory disease due to dyspnea. No minimal important difference is established. A decrease in score of 1 point or greater is considered a positive change.   Pulmonary Function Assessment: Pulmonary Function Assessment - 01/11/19 1504      Breath   Bilateral Breath Sounds  Clear    Shortness of Breath  Yes;Fear of Shortness of Breath;Limiting activity       Exercise Target Goals: Exercise Program Goal: Individual exercise prescription set using results from initial 6 min walk test and THRR while considering  patient's activity barriers and safety.   Exercise Prescription Goal: Initial exercise prescription builds to 30-45 minutes a day of aerobic activity, 2-3 days per week.  Home exercise guidelines will be given to patient during program as part of exercise prescription that the participant will  acknowledge.  Activity Barriers & Risk Stratification: Activity Barriers & Cardiac Risk Stratification - 01/11/19 1501      Activity Barriers & Cardiac Risk Stratification   Activity Barriers  Deconditioning;Muscular Weakness;Shortness of Breath       6 Minute Walk: 6 Minute Walk    Row Name 01/11/19 1620         6 Minute Walk   Phase  Initial     Distance  915 feet     Walk Time  5.58 minutes     # of Rest Breaks  1 standing rest 25 sec due to desaturation     MPH  1.73     METS  3.12     RPE  13     Perceived Dyspnea   3     VO2 Peak  10.92     Symptoms  No     Resting HR  99 bpm     Resting BP  112/74     Resting Oxygen Saturation   99 %     Exercise Oxygen Saturation  during 6 min walk  84 %     Max Ex. HR  159 bpm     Max Ex. BP  154/72     2 Minute Post BP  126/80       Interval HR   1 Minute HR  159     2 Minute HR  134     3 Minute HR  135     4 Minute HR  123     5 Minute HR  125     6 Minute HR  131     2 Minute Post HR  111     Interval Heart Rate?  Yes       Interval Oxygen   Interval Oxygen?  Yes     Baseline Oxygen Saturation %  99 %     1 Minute Oxygen Saturation %  92 %     1 Minute Liters of Oxygen  6 L     2 Minute Oxygen Saturation %  87 %     2 Minute Liters of Oxygen  6 L     3 Minute Oxygen Saturation %  84 %     3 Minute Liters of Oxygen  6 L     4 Minute Oxygen Saturation %  91 %     4 Minute Liters of Oxygen  8 L     5 Minute Oxygen Saturation %  88 %     5 Minute Liters of Oxygen  8 L     6 Minute Oxygen Saturation %  86 %     6 Minute Liters of Oxygen  8 L     2 Minute Post Oxygen Saturation %  96 %     2 Minute Post Liters of Oxygen  6 L        Oxygen Initial Assessment: Oxygen Initial Assessment - 01/11/19 1619      Home Oxygen   Home Oxygen Device  E-Tanks;Home Concentrator    Sleep Oxygen Prescription  Continuous    Liters per minute  7    Home Exercise Oxygen Prescription  Continuous    Liters per minute  10     Home at Rest Exercise Oxygen Prescription  Continuous    Liters per minute  2    Compliance with Home Oxygen Use  Yes      Initial 6 min Walk   Oxygen Used  Continuous;E-Tanks    Liters per minute  8      Program Oxygen Prescription   Program Oxygen Prescription  Continuous;E-Tanks    Liters per minute  10      Intervention   Short Term Goals  To learn and exhibit compliance with exercise, home and travel O2 prescription;To learn and understand importance of monitoring SPO2 with pulse oximeter and demonstrate accurate use of the pulse oximeter.;To learn and understand importance of maintaining oxygen saturations>88%;To learn and demonstrate proper pursed lip breathing techniques or other breathing  techniques.;To learn and demonstrate proper use of respiratory medications    Long  Term Goals  Exhibits compliance with exercise, home and travel O2 prescription;Verbalizes importance of monitoring SPO2 with pulse oximeter and return demonstration;Maintenance of O2 saturations>88%;Exhibits proper breathing techniques, such as pursed lip breathing or other method taught during program session;Compliance with respiratory medication;Demonstrates proper use of MDI's       Oxygen Re-Evaluation:   Oxygen Discharge (Final Oxygen Re-Evaluation):   Initial Exercise Prescription: Initial Exercise Prescription - 01/11/19 1600      Date of Initial Exercise RX and Referring Provider   Date  01/11/19    Referring Provider  Dr. [name]      Oxygen   Oxygen  Continuous    Liters  8-10      Treadmill   MPH  1.6    Grade  1    Minutes  15      NuStep   Level  2    SPM  80    Minutes  15      Prescription Details   Frequency (times per week)  2    Duration  Progress to 30 minutes of continuous aerobic without signs/symptoms of physical distress      Intensity   THRR 40-80% of Max Heartrate  68-136    Ratings of Perceived Exertion  11-13    Perceived Dyspnea  0-4      Progression    Progression  Continue progressive overload as per policy without signs/symptoms or physical distress.      Resistance Training   Training Prescription  Yes    Weight  orange bands    Reps  10-15       Perform Capillary Blood Glucose checks as needed.  Exercise Prescription Changes:   Exercise Comments:   Exercise Goals and Review: Exercise Goals    Row Name 01/11/19 1633             Exercise Goals   Increase Physical Activity  Yes       Intervention  Provide advice, education, support and counseling about physical activity/exercise needs.;Develop an individualized exercise prescription for aerobic and resistive training based on initial evaluation findings, risk stratification, comorbidities and participant's personal goals.       Expected Outcomes  Short Term: Attend rehab on a regular basis to increase amount of physical activity.;Long Term: Add in home exercise to make exercise part of routine and to increase amount of physical activity.;Long Term: Exercising regularly at least 3-5 days a week.       Increase Strength and Stamina  Yes       Intervention  Provide advice, education, support and counseling about physical activity/exercise needs.;Develop an individualized exercise prescription for aerobic and resistive training based on initial evaluation findings, risk stratification, comorbidities and participant's personal goals.       Expected Outcomes  Short Term: Increase workloads from initial exercise prescription for resistance, speed, and METs.;Short Term: Perform resistance training exercises routinely during rehab and add in resistance training at home;Long Term: Improve cardiorespiratory fitness, muscular endurance and strength as measured by increased METs and functional capacity (6MWT)       Able to understand and use rate of perceived exertion (RPE) scale  Yes       Intervention  Provide education and explanation on how to use RPE scale       Expected Outcomes  Short  Term: Able to use RPE daily in rehab to express subjective intensity level;Long Term:  Able to use RPE to guide intensity level when exercising independently       Able to understand and use Dyspnea scale  Yes       Intervention  Provide education and explanation on how to use Dyspnea scale       Expected Outcomes  Short Term: Able to use Dyspnea scale daily in rehab to express subjective sense of shortness of breath during exertion;Long Term: Able to use Dyspnea scale to guide intensity level when exercising independently       Knowledge and understanding of Target Heart Rate Range (THRR)  Yes       Intervention  Provide education and explanation of THRR including how the numbers were predicted and where they are located for reference       Expected Outcomes  Short Term: Able to state/look up THRR;Long Term: Able to use THRR to govern intensity when exercising independently;Short Term: Able to use daily as guideline for intensity in rehab       Understanding of Exercise Prescription  Yes       Intervention  Provide education, explanation, and written materials on patient's individual exercise prescription       Expected Outcomes  Short Term: Able to explain program exercise prescription;Long Term: Able to explain home exercise prescription to exercise independently          Exercise Goals Re-Evaluation :   Discharge Exercise Prescription (Final Exercise Prescription Changes):   Nutrition:  Target Goals: Understanding of nutrition guidelines, daily intake of sodium <1535m, cholesterol <205m calories 30% from fat and 7% or less from saturated fats, daily to have 5 or more servings of fruits and vegetables.  Biometrics: Pre Biometrics - 01/11/19 1502      Pre Biometrics   Grip Strength  18 kg        Nutrition Therapy Plan and Nutrition Goals:   Nutrition Assessments:   Nutrition Goals Re-Evaluation:   Nutrition Goals Discharge (Final Nutrition Goals  Re-Evaluation):   Psychosocial: Target Goals: Acknowledge presence or absence of significant depression and/or stress, maximize coping skills, provide positive support system. Participant is able to verbalize types and ability to use techniques and skills needed for reducing stress and depression.  Initial Review & Psychosocial Screening: Initial Psych Review & Screening - 01/11/19 1505      Initial Review   Current issues with  Current Stress Concerns    Source of Stress Concerns  Chronic Illness    Comments  Is in need of losing weight so she can have a lung transplant, will need to go down to 180 pounds      FaWilber Yes      Barriers   Psychosocial barriers to participate in program  There are no identifiable barriers or psychosocial needs.      Screening Interventions   Interventions  Encouraged to exercise       Quality of Life Scores:  Scores of 19 and below usually indicate a poorer quality of life in these areas.  A difference of  2-3 points is a clinically meaningful difference.  A difference of 2-3 points in the total score of the Quality of Life Index has been associated with significant improvement in overall quality of life, self-image, physical symptoms, and general health in studies assessing change in quality of life.  PHQ-9: Recent Review Flowsheet Data    [facility] 01/11/2019 02/13/2018 12/22/2017 08/10/2017   Decreased Interest 0 3 2  3   Down, Depressed, Hopeless 0 1 0 1   PHQ - 2 Score 0 _0 Altered sleeping _1 Tired, decreased energy _2 Change in appetite _3 Feeling bad or failure about yourself  0 _4 Trouble concentrating 0 _5 Moving slowly or fidgety/restless 0 1 0 0   Suicidal thoughts 0 0 0 0   PHQ-9 Score - _6 Difficult doing work/chores Not difficult at all Very difficult Somewhat difficult Very difficult     Interpretation of Total Score  Total Score  Depression Severity:  1-4 = Minimal depression, 5-9 = Mild depression, 10-14 = Moderate depression, 15-19 = Moderately severe depression, 20-27 = Severe depression   Psychosocial Evaluation and Intervention: Psychosocial Evaluation - 01/11/19 1506      Psychosocial Evaluation & Interventions   Interventions  Encouraged to exercise with the program and follow exercise prescription;Stress management education    Continue Psychosocial Services   No Follow up required       Psychosocial Re-Evaluation:   Psychosocial Discharge (Final Psychosocial Re-Evaluation):   Education: Education Goals: Education classes will be provided on a weekly basis, covering required topics. Participant will state understanding/return demonstration of topics presented.  Learning Barriers/Preferences:   Education Topics: Risk Factor Reduction:  -Group instruction that is supported by a PowerPoint presentation. Instructor discusses the definition of a risk factor, different risk factors for pulmonary disease, and how the heart and lungs work together.     Nutrition for Pulmonary Patient:  -Group instruction provided by PowerPoint slides, verbal discussion, and written materials to support subject matter. The instructor gives an explanation and review of healthy diet recommendations, which includes a discussion on weight management, recommendations for fruit and vegetable consumption, as well as protein, fluid, caffeine, fiber, sodium, sugar, and alcohol. Tips for eating when patients are short of breath are discussed.   PULMONARY REHAB OTHER RESPIRATORY from 12/15/2017 in [place]  Date  10/20/17  Educator  Rodman Pickle  Instruction Review Code  2- Demonstrated Understanding      Pursed Lip Breathing:  -Group instruction that is supported by demonstration and informational handouts. Instructor discusses the benefits of pursed lip and diaphragmatic breathing and detailed  demonstration on how to preform both.     Oxygen Safety:  -Group instruction provided by PowerPoint, verbal discussion, and written material to support subject matter. There is an overview of "What is Oxygen" and "Why do we need it".  Instructor also reviews how to create a safe environment for oxygen use, the importance of using oxygen as prescribed, and the risks of noncompliance. There is a brief discussion on traveling with oxygen and resources the patient may utilize.   PULMONARY REHAB OTHER RESPIRATORY from 12/15/2017 in [place]  Date  11/24/17  Educator  Cloyde Reams  Instruction Review Code  1- Verbalizes Understanding      Oxygen Equipment:  -Group instruction provided by [employer] utilizing handouts, written materials, and Insurance underwriter.   Signs and Symptoms:  -Group instruction provided by written material and verbal discussion to support subject matter. Warning signs and symptoms of infection, stroke, and heart attack are reviewed and when to call the physician/911 reinforced. Tips for preventing the spread of infection discussed.   PULMONARY REHAB OTHER RESPIRATORY from 12/15/2017 in [facility]  CARDIAC REHAB  Date  09/01/17  Educator  RN  Instruction Review Code  1- Verbalizes Understanding      Advanced Directives:  -Group instruction provided by verbal instruction and written material to support subject matter. Instructor reviews Advanced Directive laws and proper instruction for filling out document.   Pulmonary Video:  -Group video education that reviews the importance of medication and oxygen compliance, exercise, good nutrition, pulmonary hygiene, and pursed lip and diaphragmatic breathing for the pulmonary patient.   Exercise for the Pulmonary Patient:  -Group instruction that is supported by a PowerPoint presentation. Instructor discusses benefits of exercise, core components of exercise,  frequency, duration, and intensity of an exercise routine, importance of utilizing pulse oximetry during exercise, safety while exercising, and options of places to exercise outside of rehab.     PULMONARY REHAB OTHER RESPIRATORY from 12/15/2017 in [place]  Date  09/22/17  Educator  Cloyde Reams  Instruction Review Code  1- Verbalizes Understanding      Pulmonary Medications:  -Verbally interactive group education provided by instructor with focus on inhaled medications and proper administration.   PULMONARY REHAB OTHER RESPIRATORY from 12/15/2017 in [place]  Date  11/01/17  Educator  pharm      Anatomy and Physiology of the Respiratory System and Intimacy:  -Group instruction provided by PowerPoint, verbal discussion, and written material to support subject matter. Instructor reviews respiratory cycle and anatomical components of the respiratory system and their functions. Instructor also reviews differences in obstructive and restrictive respiratory diseases with examples of each. Intimacy, Sex, and Sexuality differences are reviewed with a discussion on how relationships can change when diagnosed with pulmonary disease. Common sexual concerns are reviewed.   PULMONARY REHAB OTHER RESPIRATORY from 12/15/2017 in [place]  Date  10/13/17  Educator  rn  Instruction Review Code  2- Demonstrated Understanding      MD DAY -A group question and answer session with a medical doctor that allows participants to ask questions that relate to their pulmonary disease state.   PULMONARY REHAB OTHER RESPIRATORY from 12/15/2017 in [place]  Date  12/15/17  Educator  yacoub  Instruction Review Code  2- Demonstrated Understanding      OTHER EDUCATION -Group or individual verbal, written, or video instructions that support the educational goals of the pulmonary rehab  program.   Holiday Eating Survival Tips:  -Group instruction provided by PowerPoint slides, verbal discussion, and written materials to support subject matter. The instructor gives patients tips, tricks, and techniques to help them not only survive but enjoy the holidays despite the onslaught of food that accompanies the holidays.   Knowledge Questionnaire Score: Knowledge Questionnaire Score - 01/11/19 1556      Knowledge Questionnaire Score   Pre Score  17/18       Core Components/Risk Factors/Patient Goals at Admission: Personal Goals and Risk Factors at Admission - 01/11/19 1507      Core Components/Risk Factors/Patient Goals on Admission    Weight Management  Yes;Weight Loss    Admit Weight  224 lb 13.9 oz (102 kg)    Goal Weight: Short Term  220 lb 7.4 oz (100 kg)    Goal Weight: Long Term  180 lb (81.6 kg)    Improve shortness of breath with ADL's  Yes    Intervention  Provide education, individualized exercise plan and daily activity instruction to help decrease symptoms of SOB  with activities of daily living.    Expected Outcomes  Short Term: Improve cardiorespiratory fitness to achieve a reduction of symptoms when performing ADLs;Long Term: Be able to perform more ADLs without symptoms or delay the onset of symptoms       Core Components/Risk Factors/Patient Goals Review:  Goals and Risk Factor Review    Row Name 01/11/19 1514             Core Components/Risk Factors/Patient Goals Review   Personal Goals Review  Weight Management/Obesity;Develop more efficient breathing techniques such as purse lipped breathing and diaphragmatic breathing and practicing self-pacing with activity.;Stress;Increase knowledge of respiratory medications and ability to use respiratory devices properly.;Improve shortness of breath with ADL's          Core Components/Risk Factors/Patient Goals at Discharge (Final Review):  Goals and Risk Factor Review - 01/11/19 1514      Core  Components/Risk Factors/Patient Goals Review   Personal Goals Review  Weight Management/Obesity;Develop more efficient breathing techniques such as purse lipped breathing and diaphragmatic breathing and practicing self-pacing with activity.;Stress;Increase knowledge of respiratory medications and ability to use respiratory devices properly.;Improve shortness of breath with ADL's       ITP Comments:   Comments:

## 2019-01-16 ENCOUNTER — Encounter (HOSPITAL_COMMUNITY)
Admission: RE | Admit: 2019-01-16 | Discharge: 2019-01-16 | Disposition: A | Discharge status: Home / Self Care | Payer: BC Managed Care – PPO | Source: Ambulatory Visit | Attending: Internal Medicine | Admitting: Internal Medicine

## 2019-01-16 ENCOUNTER — Telehealth: Payer: Self-pay | Admitting: Internal Medicine

## 2019-01-16 ENCOUNTER — Other Ambulatory Visit: Payer: Self-pay

## 2019-01-16 VITALS — Wt 221.8 lb

## 2019-01-16 DIAGNOSIS — J849 Interstitial pulmonary disease, unspecified: Secondary | ICD-10-CM

## 2019-01-16 DIAGNOSIS — J84112 Idiopathic pulmonary fibrosis: Secondary | ICD-10-CM | POA: Diagnosis not present

## 2019-01-16 NOTE — Telephone Encounter (Signed)
Tasha Ballard will keep eye out for everything . They usually call us first thing.

## 2019-01-16 NOTE — Telephone Encounter (Signed)
Ok to increase dose of cytoxan. Cpying [name] Parrett so she can ensure the order   What is the date for the infusion?  Also, let her know that I d/w Dr Elisabeth Cara at [facility] and he feels 1  Year of cytoxan is plenty (oral and IV combined). I will be looking into what to do for 2021. For now ok to continue cytoxan   Thanks    SIGNATURE    Dr. Brand Males, M.D., F.C.C.P,  Pulmonary and Critical Care Medicine Staff Physician, [place] Director - Interstitial Lung Disease  Program  Pulmonary [place] [facility] [place], [place], [address]  Pager: [phone], If no answer or between  15:00h - 7:00h: call [phone] Telephone: [phone]  4:17 PM 01/16/2019

## 2019-01-16 NOTE — Telephone Encounter (Signed)
Spoke with pt, she states she was supposed to call MR and advise if she wanted to go on the higher dose of Cytoxan. She states she is feeling fine and ok to increase dose. MR please advise.    Plan  -Continue IV Cytoxan with the next cycle being around Thanksgiving 2020 [facility] me a few days before and we can see depending on how we do about increasing the dose) -Continue daily prednisone at a minimum 10 mg/day -Continue 3 times a week Bactrim -refills to be sent today -Continue oxygen 6 L at rest and up to 8-10 L with exertion -Pneumonia vaccine today biister -I have written to Doreatha Martin her pulmonary rehab about getting her back into pulmonary rehabilitation -Congratulations on the weight loss.  Continue to lose weight as a goal towards getting a lung transplant

## 2019-01-16 NOTE — Telephone Encounter (Signed)
Called and spoke with pt letting her know that MR said it is okay to increase the dose of cytoxan and pt verbalized understanding. Pt stated her next infusion is scheduled tomorrow at [place] to both MR and TP.

## 2019-01-16 NOTE — Progress Notes (Signed)
Daily Session Note  Patient Details  Name: Tasha Ballard MRN: [id number] Date of Birth: 08/02/1970 Referring Provider:     Pulmonary Rehab Walk Test from 01/11/2019 in [place]  Referring Provider  Dr. [name]      Encounter Date: 01/16/2019  Check In: Session Check In - 01/16/19 1122      Check-In   Supervising physician immediately available to respond to emergencies  [facility] immediately available    Physician(s)  Dr. Wyline Copas    Location  [facility]    Staff Present  Rosebud Poles, RN, Bjorn Loser, MS, Exercise Physiologist;Elane Peabody Ysidro Evert, RN    Virtual Visit  No    Medication changes reported      No    Fall or balance concerns reported     No    Tobacco Cessation  No Change    Warm-up and Cool-down  Performed as group-led instruction    Resistance Training Performed  Yes    VAD Patient?  No    PAD/SET Patient?  No      Pain Assessment   Currently in Pain?  No/denies    Multiple Pain Sites  No       Capillary Blood Glucose: No results found for this or any previous visit (from the past 24 hour(s)).    Social History   Tobacco Use  Smoking Status Never Smoker  Smokeless Tobacco Never Used    Goals Met:  Exercise tolerated well No report of cardiac concerns or symptoms Strength training completed today  Goals Unmet:  Not Applicable  Comments: Service time is from 1035 to 1139    Dr. Rush Farmer is Medical Director for Pulmonary Rehab at [facility]

## 2019-01-17 ENCOUNTER — Encounter (HOSPITAL_COMMUNITY): Payer: Self-pay

## 2019-01-17 ENCOUNTER — Encounter (HOSPITAL_COMMUNITY)
Admission: RE | Admit: 2019-01-17 | Discharge: 2019-01-17 | Disposition: A | Discharge status: Home / Self Care | Payer: BC Managed Care – PPO | Source: Ambulatory Visit | Attending: Internal Medicine | Admitting: Internal Medicine

## 2019-01-17 DIAGNOSIS — J84112 Idiopathic pulmonary fibrosis: Secondary | ICD-10-CM | POA: Diagnosis present

## 2019-01-17 LAB — COMPREHENSIVE METABOLIC PANEL
ALT: 25 U/L (ref 0–44)
AST: 20 U/L (ref 15–41)
Albumin: 3.3 g/dL — ABNORMAL LOW (ref 3.5–5.0)
Alkaline Phosphatase: 48 U/L (ref 38–126)
Anion gap: 10 (ref 5–15)
BUN: 12 mg/dL (ref 6–20)
CO2: 29 mmol/L (ref 22–32)
Calcium: 9.2 mg/dL (ref 8.9–10.3)
Chloride: 97 mmol/L — ABNORMAL LOW (ref 98–111)
Creatinine, Ser: 0.85 mg/dL (ref 0.44–1.00)
GFR calc Af Amer: >60 mL/min (ref >60)
GFR calc non Af Amer: >60 mL/min (ref >60)
Glucose, Bld: 142 mg/dL — ABNORMAL HIGH (ref 70–99)
Potassium: 3.7 mmol/L (ref 3.5–5.1)
Sodium: 136 mmol/L (ref 135–145)
Total Bilirubin: 0.7 mg/dL (ref 0.3–1.2)
Total Protein: 6 g/dL — ABNORMAL LOW (ref 6.5–8.1)

## 2019-01-17 LAB — CBC WITH DIFFERENTIAL/PLATELET
Abs Immature Granulocytes: 0.12 10*3/uL — ABNORMAL HIGH (ref 0.00–0.07)
Basophils Absolute: 0.1 10*3/uL (ref 0.0–0.1)
Basophils Relative: 1 %
Eosinophils Absolute: 0.2 10*3/uL (ref 0.0–0.5)
Eosinophils Relative: 2 %
HCT: 38.4 % (ref 36.0–46.0)
Hemoglobin: 11.8 g/dL — ABNORMAL LOW (ref 12.0–15.0)
Immature Granulocytes: 1 %
Lymphocytes Relative: 10 %
Lymphs Abs: 0.9 10*3/uL (ref 0.7–4.0)
MCH: 31 pg (ref 26.0–34.0)
MCHC: 30.7 g/dL (ref 30.0–36.0)
MCV: 100.8 fL — ABNORMAL HIGH (ref 80.0–100.0)
Monocytes Absolute: 0.7 10*3/uL (ref 0.1–1.0)
Monocytes Relative: 8 %
Neutro Abs: 7 10*3/uL (ref 1.7–7.7)
Neutrophils Relative %: 78 %
Platelets: 172 10*3/uL (ref 150–400)
RBC: 3.81 MIL/uL — ABNORMAL LOW (ref 3.87–5.11)
RDW: 13.8 % (ref 11.5–15.5)
WBC: 8.9 10*3/uL (ref 4.0–10.5)
nRBC: 0 % (ref 0.0–0.2)

## 2019-01-17 LAB — URINALYSIS, ROUTINE W REFLEX MICROSCOPIC
Bilirubin Urine: NEGATIVE
Glucose, UA: NEGATIVE mg/dL
Hgb urine dipstick: NEGATIVE
Ketones, ur: NEGATIVE mg/dL
Leukocytes,Ua: NEGATIVE
Nitrite: NEGATIVE
Protein, ur: NEGATIVE mg/dL
Specific Gravity, Urine: 1.005 (ref 1.005–1.030)
pH: 6 (ref 5.0–8.0)

## 2019-01-17 LAB — PHOSPHORUS: Phosphorus: 4.2 mg/dL (ref 2.5–4.6)

## 2019-01-17 LAB — MAGNESIUM: Magnesium: 1.9 mg/dL (ref 1.7–2.4)

## 2019-01-17 MED ORDER — SODIUM CHLORIDE 0.9 % IV SOLN
2000.0000 mg | Freq: Once | INTRAVENOUS | Status: AC
  Administered 2019-01-17: 11:00:00 2000 mg via INTRAVENOUS
  Filled 2019-01-17: qty 100

## 2019-01-17 MED ORDER — DEXTROSE 5 % IV SOLN: 1000.0000 mg/m2 | INTRAVENOUS | Status: DC

## 2019-01-17 MED ORDER — MESNA 100 MG/ML IV SOLN: 250.0000 mg | INTRAVENOUS | Status: DC

## 2019-01-17 MED ORDER — HYDROCORTISONE NA SUCCINATE PF 100 MG IJ SOLR
100.0000 mg | INTRAMUSCULAR | Status: DC
  Administered 2019-01-17: 11:00:00 100 mg via INTRAVENOUS
  Filled 2019-01-17: qty 2

## 2019-01-17 MED ORDER — DEXTROSE-NACL 5-0.45 % IV SOLN
INTRAVENOUS | Status: DC
  Administered 2019-01-17: 09:00:00 via INTRAVENOUS

## 2019-01-17 MED ORDER — DEXTROSE-NACL 5-0.45 % IV SOLN: INTRAVENOUS | Status: DC

## 2019-01-17 MED ORDER — ONDANSETRON HCL 8 MG PO TABS
8.0000 mg | ORAL_TABLET | ORAL | Status: DC
  Administered 2019-01-17: 8 mg via ORAL
  Filled 2019-01-17 (×2): qty 1

## 2019-01-17 MED ORDER — SODIUM CHLORIDE 0.9 % IV SOLN
500.0000 mg | Freq: Once | INTRAVENOUS | Status: AC
  Administered 2019-01-17: 500 mg via INTRAVENOUS
  Filled 2019-01-17: qty 5

## 2019-01-17 MED ORDER — PROCHLORPERAZINE MALEATE 5 MG PO TABS
15.0000 mg | ORAL_TABLET | Freq: Once | ORAL | Status: AC
  Administered 2019-01-17: 14:00:00 15 mg via ORAL
  Filled 2019-01-17: qty 1

## 2019-01-17 MED ORDER — ONDANSETRON HCL 8 MG PO TABS: 8.0000 mg | ORAL_TABLET | ORAL | Status: DC

## 2019-01-17 MED ORDER — DEXTROSE 5 % IV SOLN: 1000.0000 mg | INTRAVENOUS | Status: DC

## 2019-01-17 MED ORDER — SODIUM CHLORIDE 0.9 % IV SOLN
500.0000 mg | Freq: Once | INTRAVENOUS | Status: AC
  Administered 2019-01-17: 14:00:00 500 mg via INTRAVENOUS
  Filled 2019-01-17: qty 5

## 2019-01-17 MED ORDER — HEPARIN SOD (PORK) LOCK FLUSH 100 UNIT/ML IV SOLN
500.0000 [IU] | INTRAVENOUS | Status: DC
  Administered 2019-01-17: 14:00:00 500 [IU] via INTRAVENOUS
  Filled 2019-01-17: qty 5

## 2019-01-22 NOTE — Telephone Encounter (Signed)
Noted. Will close encounter as nothing further needed at this time.

## 2019-01-23 ENCOUNTER — Encounter (HOSPITAL_COMMUNITY): Payer: BC Managed Care – PPO

## 2019-01-23 NOTE — Progress Notes (Signed)
Daily Session Note  Patient Details  Name: Tasha Ballard MRN: [id number] Date of Birth: Sep 15, 1970 Referring Provider:     Pulmonary Rehab Walk Test from 01/11/2019 in [place]  Referring Provider  Dr. [name]      Encounter Date: 01/16/2019  Check In:   Capillary Blood Glucose: No results found for this or any previous visit (from the past 24 hour(s)). POCT Glucose - 01/23/19 1139      POCT Blood Glucose   Pre-Exercise  111 mg/dL    Post-Exercise  123 mg/dL        Social History   Tobacco Use  Smoking Status Never Smoker  Smokeless Tobacco Never Used    Goals Met:  Proper associated with RPD/PD & O2 Sat Exercise tolerated well Strength training completed today  Goals Unmet:  Not Applicable  Comments: Service time is from 1035  to [place]    Dr. Rush Farmer is Medical Director for Pulmonary Rehab at [facility].

## 2019-01-25 ENCOUNTER — Telehealth: Payer: Self-pay | Admitting: Internal Medicine

## 2019-01-25 ENCOUNTER — Encounter (HOSPITAL_COMMUNITY): Payer: BC Managed Care – PPO

## 2019-01-25 DIAGNOSIS — R309 Painful micturition, unspecified: Secondary | ICD-10-CM

## 2019-01-25 MED ORDER — FLUCONAZOLE 100 MG PO TABS: 100.0000 mg | ORAL_TABLET | Freq: Every day | ORAL | 0 refills | Status: AC

## 2019-01-25 NOTE — Telephone Encounter (Signed)
Had upset stomach prior to Cytoxan.  Then diarrhea diarrhea started after Cytoxan .  On OFEV and Metformin.  Has perineal irriatation, severe.  Started Urinary burning  Had this same issue in May . Diflucan help greatly .   Diarrhea is getting better , she has changed to more bland diet and this Is helping   Would hold OFEV and Metformin for 3 days .  Begin Diflucan 128m daily for 3 days (is 1 week out from cytoxan ) rx sent to pharm  Do not take Diflucan and Bactrim together,   Taking Bactrim daily , change to 3 times a week (beginning 01/29/19 Advance bland diet as tolerated  Return tomorrow for labs with UA and Urine Culture and CBC    Needs ov with Dr. RChase Callerin 2-3 weeks and As needed   Please contact office for sooner follow up if symptoms do not improve or worsen or seek emergency care

## 2019-01-25 NOTE — Telephone Encounter (Signed)
Spoke with the pt and notified to come in for labs and also scheduled appt with Dr [name] for 02/08/19 at 9:30 am TP had already called and spoken with her about the below recs  Nothing further needed

## 2019-01-25 NOTE — Telephone Encounter (Signed)
Spoke with the pt  She states just finished chemo 2 days ago  She has been having severe diarrhea since then  She states that since the diarrhea has started she now feels like she is getting a UTI "feels like I am on fire" She has had some fever off and on  She states she has had this issue before and that TP is familiar with her having this problem and wants TP to address this for her Please advise thanks!

## 2019-01-26 ENCOUNTER — Other Ambulatory Visit (INDEPENDENT_AMBULATORY_CARE_PROVIDER_SITE_OTHER): Payer: BC Managed Care – PPO

## 2019-01-26 ENCOUNTER — Other Ambulatory Visit: Payer: Self-pay | Admitting: Adult Health

## 2019-01-26 DIAGNOSIS — R309 Painful micturition, unspecified: Secondary | ICD-10-CM

## 2019-01-26 DIAGNOSIS — Z5181 Encounter for therapeutic drug level monitoring: Secondary | ICD-10-CM

## 2019-01-26 DIAGNOSIS — J849 Interstitial pulmonary disease, unspecified: Secondary | ICD-10-CM

## 2019-01-26 DIAGNOSIS — J8489 Other specified interstitial pulmonary diseases: Secondary | ICD-10-CM

## 2019-01-26 LAB — CBC WITH DIFFERENTIAL/PLATELET
Basophils Absolute: 0 10*3/uL (ref 0.0–0.1)
Basophils Relative: 1.7 % (ref 0.0–3.0)
Eosinophils Absolute: 0.1 10*3/uL (ref 0.0–0.7)
Eosinophils Relative: 6.8 % — ABNORMAL HIGH (ref 0.0–5.0)
HCT: 37.5 % (ref 36.0–46.0)
Hemoglobin: 12.1 g/dL (ref 12.0–15.0)
Lymphocytes Relative: 23.1 % (ref 12.0–46.0)
Lymphs Abs: 0.4 10*3/uL — ABNORMAL LOW (ref 0.7–4.0)
MCHC: 32.2 g/dL (ref 30.0–36.0)
MCV: 94.9 fl (ref 78.0–100.0)
Monocytes Absolute: 0.1 10*3/uL (ref 0.1–1.0)
Monocytes Relative: 8.1 % (ref 3.0–12.0)
Neutro Abs: 1 10*3/uL — ABNORMAL LOW (ref 1.4–7.7)
Neutrophils Relative %: 60.3 % (ref 43.0–77.0)
Platelets: 224 10*3/uL (ref 150.0–400.0)
RBC: 3.95 Mil/uL (ref 3.87–5.11)
RDW: 14.5 % (ref 11.5–15.5)
WBC: 1.7 10*3/uL — CL (ref 4.0–10.5)

## 2019-01-26 LAB — URINALYSIS
Bilirubin Urine: NEGATIVE
Hgb urine dipstick: NEGATIVE
Ketones, ur: NEGATIVE
Leukocytes,Ua: NEGATIVE
Nitrite: NEGATIVE
Specific Gravity, Urine: 1.015 (ref 1.000–1.030)
Total Protein, Urine: NEGATIVE
Urine Glucose: NEGATIVE
Urobilinogen, UA: 0.2 (ref 0.0–1.0)
pH: 7 (ref 5.0–8.0)

## 2019-01-26 NOTE — Progress Notes (Signed)
Called spoke with patient, advised of lab results / recs as stated by [place] NP.  Pt verbalized understanding and denied any questions.  Orders only encounter created for [facility] order - patient will come to the office on 12.10.20.  Patient will keep 12.17.20 visit with Dr [name].

## 2019-01-27 LAB — URINE CULTURE
MICRO NUMBER:: 1164856
SPECIMEN QUALITY:: ADEQUATE

## 2019-01-30 ENCOUNTER — Encounter (HOSPITAL_COMMUNITY)
Admission: RE | Admit: 2019-01-30 | Discharge: 2019-01-30 | Disposition: A | Discharge status: Home / Self Care | Payer: BC Managed Care – PPO | Source: Ambulatory Visit | Attending: Internal Medicine | Admitting: Internal Medicine

## 2019-01-30 ENCOUNTER — Other Ambulatory Visit: Payer: Self-pay

## 2019-01-30 ENCOUNTER — Other Ambulatory Visit: Payer: Self-pay | Admitting: Internal Medicine

## 2019-01-30 DIAGNOSIS — J849 Interstitial pulmonary disease, unspecified: Secondary | ICD-10-CM

## 2019-01-30 DIAGNOSIS — J84112 Idiopathic pulmonary fibrosis: Secondary | ICD-10-CM | POA: Diagnosis present

## 2019-01-30 NOTE — Progress Notes (Signed)
Daily Session Note  Patient Details  Name: SHILPA BUSHEE MRN: [id number] Date of Birth: 1971-01-23 Referring Provider:     Pulmonary Rehab Walk Test from 01/11/2019 in [place]  Referring Provider  Dr. [name]      Encounter Date: 01/30/2019  Check In: Session Check In - 01/30/19 1131      Check-In   Supervising physician immediately available to respond to emergencies  [facility] immediately available    Physician(s)  Dr. Benny Lennert    Location  [facility]    Staff Present  Rosebud Poles, RN, Bjorn Loser, MS, Exercise Physiologist;Camerin Ladouceur Ysidro Evert, RN    Virtual Visit  No    Medication changes reported      No    Fall or balance concerns reported     No    Tobacco Cessation  No Change    Warm-up and Cool-down  Performed as group-led instruction    Resistance Training Performed  Yes    VAD Patient?  No    PAD/SET Patient?  No      Pain Assessment   Currently in Pain?  No/denies    Multiple Pain Sites  No       Capillary Blood Glucose: No results found for this or any previous visit (from the past 24 hour(s)).    Social History   Tobacco Use  Smoking Status Never Smoker  Smokeless Tobacco Never Used    Goals Met:  Exercise tolerated well No report of cardiac concerns or symptoms Strength training completed today  Goals Unmet:  Not Applicable  Comments: Service time is from 1037 to 1132    Dr. Rush Farmer is Medical Director for Pulmonary Rehab at [facility].

## 2019-02-01 ENCOUNTER — Other Ambulatory Visit (INDEPENDENT_AMBULATORY_CARE_PROVIDER_SITE_OTHER): Payer: BC Managed Care – PPO

## 2019-02-01 ENCOUNTER — Encounter (HOSPITAL_COMMUNITY)
Admission: RE | Admit: 2019-02-01 | Discharge: 2019-02-01 | Disposition: A | Discharge status: Home / Self Care | Payer: BC Managed Care – PPO | Source: Ambulatory Visit | Attending: Internal Medicine | Admitting: Internal Medicine

## 2019-02-01 ENCOUNTER — Other Ambulatory Visit: Payer: Self-pay

## 2019-02-01 VITALS — Wt 222.0 lb

## 2019-02-01 DIAGNOSIS — J8489 Other specified interstitial pulmonary diseases: Secondary | ICD-10-CM

## 2019-02-01 DIAGNOSIS — Z5181 Encounter for therapeutic drug level monitoring: Secondary | ICD-10-CM

## 2019-02-01 DIAGNOSIS — J849 Interstitial pulmonary disease, unspecified: Secondary | ICD-10-CM

## 2019-02-01 DIAGNOSIS — J84112 Idiopathic pulmonary fibrosis: Secondary | ICD-10-CM | POA: Diagnosis not present

## 2019-02-01 LAB — CBC WITH DIFFERENTIAL/PLATELET
Basophils Absolute: 0 10*3/uL (ref 0.0–0.1)
Basophils Relative: 0.7 % (ref 0.0–3.0)
Eosinophils Absolute: 0.1 10*3/uL (ref 0.0–0.7)
Eosinophils Relative: 3.5 % (ref 0.0–5.0)
HCT: 36.1 % (ref 36.0–46.0)
Hemoglobin: 11.5 g/dL — ABNORMAL LOW (ref 12.0–15.0)
Lymphocytes Relative: 23.8 % (ref 12.0–46.0)
Lymphs Abs: 0.6 10*3/uL — ABNORMAL LOW (ref 0.7–4.0)
MCHC: 31.9 g/dL (ref 30.0–36.0)
MCV: 95.3 fl (ref 78.0–100.0)
Monocytes Absolute: 0.7 10*3/uL (ref 0.1–1.0)
Monocytes Relative: 24.9 % — ABNORMAL HIGH (ref 3.0–12.0)
Neutro Abs: 1.2 10*3/uL — ABNORMAL LOW (ref 1.4–7.7)
Neutrophils Relative %: 47.1 % (ref 43.0–77.0)
Platelets: 250 10*3/uL (ref 150.0–400.0)
RBC: 3.79 Mil/uL — ABNORMAL LOW (ref 3.87–5.11)
RDW: 14.4 % (ref 11.5–15.5)
WBC: 2.6 10*3/uL — ABNORMAL LOW (ref 4.0–10.5)

## 2019-02-01 NOTE — Progress Notes (Signed)
Tasha Ballard 48 y.o. female Nutrition Note   Past Medical History:  Diagnosis Date  . Aneurysm ([place])   . Back pain   . Diabetes mellitus without complication ([place])   . H/O blood clots   . Hypertension   . Joint pain   . Pulmonary fibrosis ([place])   . SOB (shortness of breath)   . Swelling   . Thyroid disease   . Vitamin D deficiency      Medications reviewed.    Current Outpatient Medications:  .  benzonatate (TESSALON) 200 MG capsule, Take 1 capsule (200 mg total) by mouth 3 (three) times daily as needed for cough., Disp: 30 capsule, Rfl: 1 .  carvedilol (COREG) 6.25 MG tablet, Take 6.25 mg by mouth 2 (two) times daily with a meal., Disp: , Rfl:  .  clindamycin (CLEOCIN T) 1 % lotion, , Disp: , Rfl:  .  cyclophosphamide (CYTOXAN) 50 MG capsule, Take 2 capsules (100 mg total) by mouth daily. Give on an empty stomach 1 hour before or 2 hours after meals., Disp: 60 capsule, Rfl: 2 .  furosemide (LASIX) 40 MG tablet, Take 40 mg by mouth See admin instructions. Take 1 tablet (40 mg) daily, may repeat dose if needed for fluid retention., Disp: , Rfl:  .  gabapentin (NEURONTIN) 100 MG capsule, Take 1 capsule by mouth as needed., Disp: , Rfl:  .  magnesium oxide (MAG-OX) 400 MG tablet, Take 1 tablet (400 mg total) by mouth daily., Disp: 30 tablet, Rfl: 5 .  metFORMIN (GLUCOPHAGE) 500 MG tablet, Take 500 mg by mouth 2 (two) times daily. , Disp: , Rfl:  .  montelukast (SINGULAIR) 10 MG tablet, Take 10 mg by mouth daily after supper., Disp: , Rfl:  .  OFEV 150 MG CAPS, Take 150 mg by mouth 2 (two) times daily., Disp: , Rfl: 11 .  ondansetron (ZOFRAN) 8 MG tablet, TAKE 1 TABLET BY MOUTH EVERY 8 HOURS AS NEEDED FOR NAUSEA AND VOMITING, Disp: 45 tablet, Rfl: 2 .  potassium chloride (KLOR-CON) 10 MEQ tablet, TAKE 1 TABLET BY MOUTH 2 TIMES A DAY, Disp: 120 tablet, Rfl: 2 .  predniSONE (DELTASONE) 10 MG tablet, Take 10-55m as needed for SOB, Disp: 30 tablet, Rfl: 3 .  PREMARIN 0.625 MG  tablet, Take 0.625 mg by mouth daily., Disp: , Rfl:  .  PROAIR HFA 108 (90 Base) MCG/ACT inhaler, Inhale 2 puffs into the lungs every 6 (six) hours as needed for wheezing or shortness of breath., Disp: , Rfl: 4 .  saccharomyces boulardii (FLORASTOR) 250 MG capsule, Take 1 capsule (250 mg total) by mouth 2 (two) times daily. (Patient not taking: Reported on 01/11/2019), Disp: 60 capsule, Rfl: 0 .  spironolactone (ALDACTONE) 100 MG tablet, Take 100 mg by mouth daily., Disp: , Rfl:  .  sulfamethoxazole-trimethoprim (BACTRIM) 400-80 MG tablet, TAKE 1 TABLET BY MOUTH ONCE DAILY, Disp: 90 tablet, Rfl: 1 .  thyroid (ARMOUR) 90 MG tablet, Take 90 mg by mouth daily before breakfast. , Disp: , Rfl:  .  Vitamin D, Ergocalciferol, (DRISDOL) 1.25 MG (50000 UT) CAPS capsule, TAKE 1 CAPSULE BY MOUTH ONCE WEEKLY, Disp: 3 capsule, Rfl: 0   Ht Readings from Last 1 Encounters:  01/17/19 _0  (1.575 m)     Wt Readings from Last 3 Encounters:  01/17/19 224 lb (101.6 kg)  01/23/19 221 lb 12.5 oz (100.6 kg)  01/11/19 224 lb 13.9 oz (102 kg)     There is no height or  weight on file to calculate BMI.   Social History   Tobacco Use  Smoking Status Never Smoker  Smokeless Tobacco Never Used     Lab Results  Component Value Date   CHOL 295 (H) 02/13/2018   Lab Results  Component Value Date   HDL 85 02/13/2018   Lab Results  Component Value Date   LDLCALC 186 (H) 02/13/2018   Lab Results  Component Value Date   TRIG 120 02/13/2018   No results found for: CHOLHDL   Lab Results  Component Value Date   HGBA1C 5.6 02/13/2018     CBG (last 3)  No results for input(s): GLUCAP in the last 72 hours.   Nutrition Note  Spoke with pt. Reviewed survey with pt. Pt states she needs to lose 35 lbs to be added to the lung transplant list. She desires to move forward but still feels uneasy about the transplant. Suspect this is impacting weight loss efforts. Pt has lost weight in the past by eating more  protein and eating structured meals rather than grazing. She enjoys cooking and does not eat leftovers. Not able to do much cooking d/t difficulty breathing so husband has been cooking and grocery shopping. Family is supportive and provides help with meals. She reports discomfort and difficulty breathing while/after eating. Recommended eating during the day when she has the most energy.  Pt feels she has been eating more cookies/palatable food recently d/t diarrhea. Pt reports experiencing diarrhea d/t OFEV and chemo. She is avoiding dairy products. Discussed foods to avoid to decrease diarrhea. Poor appetite. Reviewed eating cold, sour foods such as sugar free popsicles.   Pt has Type 2 Diabetes. Last A1c indicates blood glucose well-controlled. Will discuss further at later date.   Per discussion, pt does use canned/convenience foods often. Pt does add salt to food. Pt does eat out frequently.   Pt expressed understanding of the information reviewed.   Nutrition Diagnosis ? Food-and nutrition-related knowledge deficit related to lack of exposure to information as related to diagnosis of: ? ILD ? Type 2 Diabetes ? Obese  III = 40+ related to excessive energy intake as evidenced by a 40.1  Nutrition Intervention ? Pt's individual nutrition plan reviewed with pt. ? Benefits of adopting Heart Healthy diet discussed when Medficts reviewed.   ? Continue client-centered nutrition education by RD, as part of interdisciplinary care.  Goal(s)  ? Pt to identify food quantities necessary to achieve weight loss of 6-24 lb at graduation from cardiac rehab.  ? Pt to build a healthy plate including vegetables, fruits, whole grains, and low-fat dairy products in a heart healthy meal plan.  Plan:   Will provide client-centered nutrition education as part of interdisciplinary care  Monitor and evaluate progress toward nutrition goal with team.   Michaele Offer, MS, RDN, LDN

## 2019-02-01 NOTE — Progress Notes (Signed)
Daily Session Note  Patient Details  Name: Tasha Ballard MRN: [id number] Date of Birth: 1971-02-09 Referring Provider:     Pulmonary Rehab Walk Test from 01/11/2019 in [place]  Referring Provider  Dr. [name]      Encounter Date: 02/01/2019  Check In: Session Check In - 02/01/19 1045      Check-In   Supervising physician immediately available to respond to emergencies  [facility] immediately available    Physician(s)  Dr. Benny Lennert    Location  [facility]    Staff Present  Rosebud Poles, RN, Bjorn Loser, MS, Exercise Physiologist;Lisa Ysidro Evert, RN    Virtual Visit  No    Medication changes reported      No    Fall or balance concerns reported     No    Tobacco Cessation  No Change    Warm-up and Cool-down  Performed on first and last piece of equipment    Resistance Training Performed  Yes    VAD Patient?  No    PAD/SET Patient?  No      Pain Assessment   Currently in Pain?  No/denies    Multiple Pain Sites  No       Capillary Blood Glucose: No results found for this or any previous visit (from the past 24 hour(s)).    Social History   Tobacco Use  Smoking Status Never Smoker  Smokeless Tobacco Never Used    Goals Met:  Independence with exercise equipment Exercise tolerated well Strength training completed today  Goals Unmet:  Not Applicable  Comments: Service time is from 1025 to 1123    Dr. Rush Farmer is Medical Director for Pulmonary Rehab at [facility].

## 2019-02-02 NOTE — Progress Notes (Signed)
Spoke with pt and notified of results per Dr. Melvyn Novas. Pt verbalized understanding and denied any questions.

## 2019-02-05 ENCOUNTER — Telehealth: Payer: Self-pay | Admitting: Adult Health

## 2019-02-05 ENCOUNTER — Other Ambulatory Visit: Payer: Self-pay

## 2019-02-05 ENCOUNTER — Telehealth: Payer: Self-pay | Admitting: Internal Medicine

## 2019-02-05 ENCOUNTER — Encounter: Payer: Self-pay | Admitting: Adult Health

## 2019-02-05 ENCOUNTER — Telehealth (INDEPENDENT_AMBULATORY_CARE_PROVIDER_SITE_OTHER): Payer: BC Managed Care – PPO | Admitting: Adult Health

## 2019-02-05 DIAGNOSIS — J8489 Other specified interstitial pulmonary diseases: Secondary | ICD-10-CM

## 2019-02-05 DIAGNOSIS — D702 Other drug-induced agranulocytosis: Secondary | ICD-10-CM

## 2019-02-05 DIAGNOSIS — T451X5A Adverse effect of antineoplastic and immunosuppressive drugs, initial encounter: Secondary | ICD-10-CM

## 2019-02-05 DIAGNOSIS — J9611 Chronic respiratory failure with hypoxia: Secondary | ICD-10-CM | POA: Diagnosis not present

## 2019-02-05 DIAGNOSIS — Z9981 Dependence on supplemental oxygen: Secondary | ICD-10-CM

## 2019-02-05 DIAGNOSIS — Z20822 Contact with and (suspected) exposure to covid-19: Secondary | ICD-10-CM

## 2019-02-05 DIAGNOSIS — D72819 Decreased white blood cell count, unspecified: Secondary | ICD-10-CM

## 2019-02-05 DIAGNOSIS — J849 Interstitial pulmonary disease, unspecified: Secondary | ICD-10-CM

## 2019-02-05 MED ORDER — CEFDINIR 300 MG PO CAPS: 300.0000 mg | ORAL_CAPSULE | Freq: Two times a day (BID) | ORAL | 0 refills | Status: DC

## 2019-02-05 NOTE — Patient Instructions (Addendum)
Begin Omnicef 300 mg twice daily for 7 days, take with food Begin probiotic daily Increase prednisone to 20 mg daily for 5 days then 10 mg daily Mucinex DM twice daily as needed for cough and congestion Incentive spirometer several times a day Continue on oxygen to keep O2 saturations greater than 88 to 90% COVID-19 testing Hold Premarin .  Follow up in 3 days with labs and chest xray.  If you symptoms are not getting better or worsen will need to be admitted.  Please contact office for sooner follow up if symptoms do not improve or worsen or seek emergency care

## 2019-02-05 NOTE — Telephone Encounter (Signed)
Spoke with patient. She stated that she was in line for testing at [facility] and saw them turning patients away since they are short staffed today. She wanted to follow up on an appt for her testing. Advised her that I did not see any appointments for testing but offered to make one for her. While attempting to make the appointment, there were only appointments for 12/16. Did advise patient that she could go to [facility] on [address] for testing. Also advised her that if she does this, to please sign a medical release so we can get a copy of her results. She verbalized understanding.   Nothing further needed.

## 2019-02-05 NOTE — Progress Notes (Signed)
Virtual Visit via Video Note  I connected with Tasha Ballard on 02/05/19 at 10:30 AM EST by a video enabled telemedicine application and verified that I am speaking with the correct person using two identifiers.  Location: Patient: Home  Provider: Office    I discussed the limitations of evaluation and management by telemedicine and the availability of in person appointments. The patient expressed understanding and agreed to proceed.  History of Present Illness: 48 year old female never smoker seen for pulmonary consult November 22, 2017 to establish for evaluation of interstitial lung disease.  She has been followed at [facility].  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  At that time she was placed on CellCept.  In 2016 patient had a clinical decline and was started on oxygen at 2 L.  She continued to have progressive decline with increased oxygen demands.  With baseline oxygen at 6 to 8 L at rest and at maximum 15 L with activity.  She received Rituxan x2 in April and May 2019.  She has been seen at [facility] Dr. Threasa Alpha in September 2019.  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  Since the surgery has been progressively getting worse.  In December 2019 due to progressive respiratory failure and increased symptom burden patient was started on IV Cytoxan (using IV Cytoxan protocol modified version from [employer] protocol).    She was also started on Ofev December 2019 patient had clinical improvement with decreased oxygen demands with a baseline oxygen at 6 to 8 L at rest.  And 10  With activity   She also has been able to participate in pulmonary rehab.  In March 2020 patient was changed from IV Cytoxan to oral Cytoxan due to COVID-19 pandemic.  She was started back on IV Cytoxan November 22, 2018 at 0.5 g/M2.   Second IV Cytoxan infusion was on December 20, 2018 at 0.5 g (lower dose was used due to urinary lab results).  Third infusion was on January 17, 2019 at 1 g/m2.  Preinfusion labs were normal.  Postinfusion patient says she typically has significant fatigue and malaise for about 1 week and then starts to feel better.  Today's video visit is for an acute office visit.  After last infusion patient called into the office on December 3 with GI symptoms of diarrhea, upset stomach and urinary symptoms.  Patient also developed a perineal rash.  This happened similarly to previous Cytoxan infusion in February and March 2020. Patient was given Diflucan for 3 days.  Patient says GI symptoms resolved.  And her rash cleared up.  She complains that 4 days ago she started to feel bad with nasal congestion, sore throat, increased cough with green-yellow mucus and sinus discharge.  Patient has no fever chills or sweats.  Patient says she has had significant decreased energy.  Fatigue and malaise.  She has had more shortness of breath with activity.  O2 saturations have decreased more with activity.  She had an episode after taking a bath where she got very short of breath and felt presyncopal.  O2 saturations dropped to 70 on 8 L.  Patient says she rested increased her oxygen and O2 saturations returned back to baseline greater than 90%.  Patient says over the last 24 hours seems that symptoms have gotten slightly better.  Patient continues to feel short of breath.  Patient lives with her husband who has no respiratory symptoms.  She has 2 sons that have returned back from school.  Does report one son had some respiratory symptoms. Patient denies any vomiting, diarrhea, hemoptysis, chest pain, orthopnea or increased edema.  Lab work showed a decreased Falk blood cell count postinfusion at 1.7 on December 4.  A repeat CBC on December 10 showed improvement with [place] blood cell count at 2.6.  Patient did go to pulmonary rehab  twice last week.  She is on chronic steroids 10 mg.  Says over the last couple days she did increase this up to 20 mg.  She also reports that she was started on Premarin 1 month ago by her primary care provider.  She denies any calf pain, hemoptysis or chest pain.  Current Outpatient Medications on File Prior to Visit  Medication Sig Dispense Refill  . benzonatate (TESSALON) 200 MG capsule Take 1 capsule (200 mg total) by mouth 3 (three) times daily as needed for cough. 30 capsule 1  . carvedilol (COREG) 6.25 MG tablet Take 6.25 mg by mouth 2 (two) times daily with a meal.    . clindamycin (CLEOCIN T) 1 % lotion     . cyclophosphamide (CYTOXAN) 50 MG capsule Take 2 capsules (100 mg total) by mouth daily. Give on an empty stomach 1 hour before or 2 hours after meals. 60 capsule 2  . furosemide (LASIX) 40 MG tablet Take 40 mg by mouth See admin instructions. Take 1 tablet (40 mg) daily, may repeat dose if needed for fluid retention.    . gabapentin (NEURONTIN) 100 MG capsule Take 1 capsule by mouth as needed.    . magnesium oxide (MAG-OX) 400 MG tablet Take 1 tablet (400 mg total) by mouth daily. 30 tablet 5  . metFORMIN (GLUCOPHAGE) 500 MG tablet Take 500 mg by mouth 2 (two) times daily.     . montelukast (SINGULAIR) 10 MG tablet Take 10 mg by mouth daily after supper.    Marland Kitchen OFEV 150 MG CAPS Take 150 mg by mouth 2 (two) times daily.  11  . ondansetron (ZOFRAN) 8 MG tablet TAKE 1 TABLET BY MOUTH EVERY 8 HOURS AS NEEDED FOR NAUSEA AND VOMITING 45 tablet 2  . potassium chloride (KLOR-CON) 10 MEQ tablet TAKE 1 TABLET BY MOUTH 2 TIMES A DAY 120 tablet 2  . predniSONE (DELTASONE) 10 MG tablet Take 10-6m as needed for SOB 30 tablet 3  . PREMARIN 0.625 MG tablet Take 0.625 mg by mouth daily.    .Marland KitchenPROAIR HFA 108 (90 Base) MCG/ACT inhaler Inhale 2 puffs into the lungs every 6 (six) hours as needed for wheezing or shortness of breath.  4  . saccharomyces boulardii (FLORASTOR) 250 MG capsule Take 1 capsule  (250 mg total) by mouth 2 (two) times daily. 60 capsule 0  . spironolactone (ALDACTONE) 100 MG tablet Take 100 mg by mouth daily.    .Marland Kitchensulfamethoxazole-trimethoprim (BACTRIM) 400-80 MG tablet TAKE 1 TABLET BY MOUTH ONCE DAILY 90 tablet 1  . thyroid (ARMOUR) 90 MG tablet Take 90 mg by mouth daily before breakfast.     . Vitamin D, Ergocalciferol, (DRISDOL) 1.25 MG (50000 UT) CAPS capsule TAKE 1 CAPSULE BY MOUTH ONCE WEEKLY 3 capsule 0   No current facility-administered medications on file prior to visit.    Observations/Objective: Appears chronically ill-appearing on oxygen sitting in recliner.  She is talking in full sentences.   Assessment and Plan: Progressive ILD requiring IV Cytoxan.  Patient has been on Cytoxan for almost 1 year. Appears to having increased side effects from most recent infusion.  Also appears to have a secondary respiratory infection.  Due to her acute respiratory symptoms and significant community spread of COVID-19 she will need COVID-19 testing. We will begin empiric antibiotics.  Short prednisone burst. Close follow-up. Discussed hospitalization, patient has high risk for decompensation with her low pulmonary reserve. She wishes to stay outpatient if possible.  Have advised her to look out for red flag symptoms such as fever nausea vomiting diarrhea or increased oxygen demands and worsening hypoxemia. Would like for her to hold Premarin as feel that the potential for VTE is high for this patient.  Would discuss with primary care provider if alternative options are available.  Will check D-dimer on return.  Chronic hypoxic respiratory failure with increased oxygen demands.  Suspect secondary to underlying superimposed bronchitis. COVID-19 testing Check chest x-ray on return Low threshold to admit to the hospital. O2 saturation goals 88 to 90%.  Continue on oxygen 6 to 8 L at rest and 8 to 10 L with activity.  May need up to 12 L with heavy activity.  Neutropenia  after recent Cytoxan infusion.  [place] blood cell count has been trending up.  Will repeat CBC.  Patient has been advised on multiple occasions on neutropenic precautions.   Plan  Patient Instructions  Begin Omnicef 300 mg twice daily for 7 days, take with food Begin probiotic daily Increase prednisone to 20 mg daily for 5 days then 10 mg daily Mucinex DM twice daily as needed for cough and congestion Incentive spirometer several times a day Continue on oxygen to keep O2 saturations greater than 88 to 90% COVID-19 testing Hold Premarin .  Follow up in 3 days with labs and chest xray.  If you symptoms are not getting better or worsen will need to be admitted.  Please contact office for sooner follow up if symptoms do not improve or worsen or seek emergency care         Follow Up Instructions: Follow-up in 3 days and as needed  Please contact office for sooner follow up if symptoms do not improve or worsen or seek emergency care     I discussed the assessment and treatment plan with the patient. The patient was provided an opportunity to ask questions and all were answered. The patient agreed with the plan and demonstrated an understanding of the instructions.   The patient was advised to call back or seek an in-person evaluation if the symptoms worsen or if the condition fails to improve as anticipated.  I provided 55  minutes of non-face-to-face time during this encounter.   Rexene Edison, NP

## 2019-02-05 NOTE — Telephone Encounter (Signed)
Spoke with patient. She states she had sinus symptoms last week. Friday 02/02/19 she was feeling weak and when she would walk around the house, her oxygen would drop to 85% on 7-8L.  Friday night she felt like she was going to pass out. Saturday she improved. Sunday able to walk around on her 7-8L satting 90s.   Asked if she went to the emergency room, patient states no she is feeling better. Mychart visit with TP per patient request at 1030.  Nothing further needed at this time

## 2019-02-06 ENCOUNTER — Encounter (HOSPITAL_COMMUNITY)
Admission: RE | Admit: 2019-02-06 | Discharge: 2019-02-06 | Disposition: A | Discharge status: Home / Self Care | Payer: BC Managed Care – PPO | Source: Ambulatory Visit | Attending: Internal Medicine | Admitting: Internal Medicine

## 2019-02-06 LAB — NOVEL CORONAVIRUS, NAA: SARS-CoV-2, NAA: NOT DETECTED

## 2019-02-06 NOTE — Progress Notes (Signed)
Pulmonary Individual Treatment Plan  Patient Details  Name: Tasha Ballard MRN: [id number] Date of Birth: 14-Nov-1970 Referring Provider:     Pulmonary Rehab Walk Test from 01/11/2019 in [facility]  Referring Provider  Dr. [name]      Initial Encounter Date:    Pulmonary Rehab Walk Test from 01/11/2019 in [facility]  Date  01/11/19      Visit Diagnosis: ILD (interstitial lung disease) ([place])  Patient's Home Medications on Admission:   Current Outpatient Medications:  .  benzonatate (TESSALON) 200 MG capsule, Take 1 capsule (200 mg total) by mouth 3 (three) times daily as needed for cough., Disp: 30 capsule, Rfl: 1 .  carvedilol (COREG) 6.25 MG tablet, Take 6.25 mg by mouth 2 (two) times daily with a meal., Disp: , Rfl:  .  cefdinir (OMNICEF) 300 MG capsule, Take 1 capsule (300 mg total) by mouth 2 (two) times daily., Disp: 14 capsule, Rfl: 0 .  clindamycin (CLEOCIN T) 1 % lotion, , Disp: , Rfl:  .  cyclophosphamide (CYTOXAN) 50 MG capsule, Take 2 capsules (100 mg total) by mouth daily. Give on an empty stomach 1 hour before or 2 hours after meals., Disp: 60 capsule, Rfl: 2 .  furosemide (LASIX) 40 MG tablet, Take 40 mg by mouth See admin instructions. Take 1 tablet (40 mg) daily, may repeat dose if needed for fluid retention., Disp: , Rfl:  .  gabapentin (NEURONTIN) 100 MG capsule, Take 1 capsule by mouth as needed., Disp: , Rfl:  .  magnesium oxide (MAG-OX) 400 MG tablet, Take 1 tablet (400 mg total) by mouth daily., Disp: 30 tablet, Rfl: 5 .  metFORMIN (GLUCOPHAGE) 500 MG tablet, Take 500 mg by mouth 2 (two) times daily. , Disp: , Rfl:  .  montelukast (SINGULAIR) 10 MG tablet, Take 10 mg by mouth daily after supper., Disp: , Rfl:  .  OFEV 150 MG CAPS, Take 150 mg by mouth 2 (two) times daily., Disp: , Rfl: 11 .  ondansetron (ZOFRAN) 8 MG tablet, TAKE 1 TABLET BY MOUTH EVERY 8 HOURS AS NEEDED FOR NAUSEA AND VOMITING,  Disp: 45 tablet, Rfl: 2 .  potassium chloride (KLOR-CON) 10 MEQ tablet, TAKE 1 TABLET BY MOUTH 2 TIMES A DAY, Disp: 120 tablet, Rfl: 2 .  predniSONE (DELTASONE) 10 MG tablet, Take 10-51m as needed for SOB, Disp: 30 tablet, Rfl: 3 .  PREMARIN 0.625 MG tablet, Take 0.625 mg by mouth daily., Disp: , Rfl:  .  PROAIR HFA 108 (90 Base) MCG/ACT inhaler, Inhale 2 puffs into the lungs every 6 (six) hours as needed for wheezing or shortness of breath., Disp: , Rfl: 4 .  saccharomyces boulardii (FLORASTOR) 250 MG capsule, Take 1 capsule (250 mg total) by mouth 2 (two) times daily., Disp: 60 capsule, Rfl: 0 .  spironolactone (ALDACTONE) 100 MG tablet, Take 100 mg by mouth daily., Disp: , Rfl:  .  sulfamethoxazole-trimethoprim (BACTRIM) 400-80 MG tablet, TAKE 1 TABLET BY MOUTH ONCE DAILY, Disp: 90 tablet, Rfl: 1 .  thyroid (ARMOUR) 90 MG tablet, Take 90 mg by mouth daily before breakfast. , Disp: , Rfl:  .  Vitamin D, Ergocalciferol, (DRISDOL) 1.25 MG (50000 UT) CAPS capsule, TAKE 1 CAPSULE BY MOUTH ONCE WEEKLY, Disp: 3 capsule, Rfl: 0  Past Medical History: Past Medical History:  Diagnosis Date  . Aneurysm ([place]   . Back pain   . Diabetes mellitus without complication ([place]   . H/O blood  clots   . Hypertension   . Joint pain   . Pulmonary fibrosis ([place])   . SOB (shortness of breath)   . Swelling   . Thyroid disease   . Vitamin D deficiency     Tobacco Use: Social History   Tobacco Use  Smoking Status Never Smoker  Smokeless Tobacco Never Used    Labs: Recent Review Scientist, physiological    Labs for ITP Cardiac and Pulmonary Rehab Latest Ref Rng & Units 02/13/2018   Cholestrol 100 - 199 mg/dL 295(H)   LDLCALC 0 - 99 mg/dL 186(H)   HDL >39 mg/dL 85   Trlycerides 0 - 149 mg/dL 120   Hemoglobin A1c 4.8 - 5.6 % 5.6      Capillary Blood Glucose: Lab Results  Component Value Date   GLUCAP 115 (H) 01/26/2018   GLUCAP 162 (H) 09/01/2017   POCT Glucose    Row Name 01/23/19 1139 02/06/19 1138            POCT Blood Glucose   Pre-Exercise  111 mg/dL  125 mg/dL      Post-Exercise  123 mg/dL  111 mg/dL         Pulmonary Assessment Scores: Pulmonary Assessment Scores    Row Name 01/11/19 1555         ADL UCSD   ADL Phase  Entry     SOB Score total  64       CAT Score   CAT Score  25       UCSD: Self-administered rating of dyspnea associated with activities of daily living (ADLs) 6-point scale (0 = "not at all" to 5 = "maximal or unable to do because of breathlessness")  Scoring Scores range from 0 to 120.  Minimally important difference is 5 units  CAT: CAT can identify the health impairment of COPD patients and is better correlated with disease progression.  CAT has a scoring range of zero to 40. The CAT score is classified into four groups of low (less than 10), medium (10 - 20), high (21-30) and very high (31-40) based on the impact level of disease on health status. A CAT score over 10 suggests significant symptoms.  A worsening CAT score could be explained by an exacerbation, poor medication adherence, poor inhaler technique, or progression of COPD or comorbid conditions.  CAT MCID is 2 points  mMRC: mMRC (Modified Medical Research Council) Dyspnea Scale is used to assess the degree of baseline functional disability in patients of respiratory disease due to dyspnea. No minimal important difference is established. A decrease in score of 1 point or greater is considered a positive change.   Pulmonary Function Assessment: Pulmonary Function Assessment - 01/11/19 1504      Breath   Bilateral Breath Sounds  Clear    Shortness of Breath  Yes;Fear of Shortness of Breath;Limiting activity       Exercise Target Goals: Exercise Program Goal: Individual exercise prescription set using results from initial 6 min walk test and THRR while considering  patient's activity barriers and safety.   Exercise Prescription Goal: Initial exercise prescription builds to 30-45  minutes a day of aerobic activity, 2-3 days per week.  Home exercise guidelines will be given to patient during program as part of exercise prescription that the participant will acknowledge.  Activity Barriers & Risk Stratification: Activity Barriers & Cardiac Risk Stratification - 01/11/19 1501      Activity Barriers & Cardiac Risk Stratification   Activity Barriers  Deconditioning;Muscular  Weakness;Shortness of Breath       6 Minute Walk: 6 Minute Walk    Row Name 01/11/19 1620         6 Minute Walk   Phase  Initial     Distance  915 feet     Walk Time  5.58 minutes     # of Rest Breaks  1 standing rest 25 sec due to desaturation     MPH  1.73     METS  3.12     RPE  13     Perceived Dyspnea   3     VO2 Peak  10.92     Symptoms  No     Resting HR  99 bpm     Resting BP  112/74     Resting Oxygen Saturation   99 %     Exercise Oxygen Saturation  during 6 min walk  84 %     Max Ex. HR  159 bpm     Max Ex. BP  154/72     2 Minute Post BP  126/80       Interval HR   1 Minute HR  159     2 Minute HR  134     3 Minute HR  135     4 Minute HR  123     5 Minute HR  125     6 Minute HR  131     2 Minute Post HR  111     Interval Heart Rate?  Yes       Interval Oxygen   Interval Oxygen?  Yes     Baseline Oxygen Saturation %  99 %     1 Minute Oxygen Saturation %  92 %     1 Minute Liters of Oxygen  6 L     2 Minute Oxygen Saturation %  87 %     2 Minute Liters of Oxygen  6 L     3 Minute Oxygen Saturation %  84 %     3 Minute Liters of Oxygen  6 L     4 Minute Oxygen Saturation %  91 %     4 Minute Liters of Oxygen  8 L     5 Minute Oxygen Saturation %  88 %     5 Minute Liters of Oxygen  8 L     6 Minute Oxygen Saturation %  86 %     6 Minute Liters of Oxygen  8 L     2 Minute Post Oxygen Saturation %  96 %     2 Minute Post Liters of Oxygen  6 L        Oxygen Initial Assessment: Oxygen Initial Assessment - 01/11/19 1619      Home Oxygen   Home Oxygen  Device  E-Tanks;Home Concentrator    Sleep Oxygen Prescription  Continuous    Liters per minute  7    Home Exercise Oxygen Prescription  Continuous    Liters per minute  10    Home at Rest Exercise Oxygen Prescription  Continuous    Liters per minute  2    Compliance with Home Oxygen Use  Yes      Initial 6 min Walk   Oxygen Used  Continuous;E-Tanks    Liters per minute  8      Program Oxygen Prescription   Program Oxygen Prescription  Continuous;E-Tanks    Liters per  minute  10      Intervention   Short Term Goals  To learn and exhibit compliance with exercise, home and travel O2 prescription;To learn and understand importance of monitoring SPO2 with pulse oximeter and demonstrate accurate use of the pulse oximeter.;To learn and understand importance of maintaining oxygen saturations>88%;To learn and demonstrate proper pursed lip breathing techniques or other breathing techniques.;To learn and demonstrate proper use of respiratory medications    Long  Term Goals  Exhibits compliance with exercise, home and travel O2 prescription;Verbalizes importance of monitoring SPO2 with pulse oximeter and return demonstration;Maintenance of O2 saturations>88%;Exhibits proper breathing techniques, such as pursed lip breathing or other method taught during program session;Compliance with respiratory medication;Demonstrates proper use of MDI's       Oxygen Re-Evaluation: Oxygen Re-Evaluation    Row Name 02/05/19 1121             Program Oxygen Prescription   Program Oxygen Prescription  Continuous;E-Tanks       Liters per minute  10       Comments  10 L on TM and 8 L on stepper         Home Oxygen   Home Oxygen Device  E-Tanks;Home Concentrator       Sleep Oxygen Prescription  Continuous       Liters per minute  7       Home Exercise Oxygen Prescription  Continuous       Liters per minute  10       Home at Rest Exercise Oxygen Prescription  Continuous       Liters per minute  2        Compliance with Home Oxygen Use  Yes         Goals/Expected Outcomes   Short Term Goals  To learn and exhibit compliance with exercise, home and travel O2 prescription;To learn and understand importance of monitoring SPO2 with pulse oximeter and demonstrate accurate use of the pulse oximeter.;To learn and understand importance of maintaining oxygen saturations>88%;To learn and demonstrate proper pursed lip breathing techniques or other breathing techniques.;To learn and demonstrate proper use of respiratory medications       Long  Term Goals  Exhibits compliance with exercise, home and travel O2 prescription;Verbalizes importance of monitoring SPO2 with pulse oximeter and return demonstration;Maintenance of O2 saturations>88%;Exhibits proper breathing techniques, such as pursed lip breathing or other method taught during program session;Compliance with respiratory medication;Demonstrates proper use of MDI's       Goals/Expected Outcomes  Compliance          Oxygen Discharge (Final Oxygen Re-Evaluation): Oxygen Re-Evaluation - 02/05/19 1121      Program Oxygen Prescription   Program Oxygen Prescription  Continuous;E-Tanks    Liters per minute  10    Comments  10 L on TM and 8 L on stepper      Home Oxygen   Home Oxygen Device  E-Tanks;Home Concentrator    Sleep Oxygen Prescription  Continuous    Liters per minute  7    Home Exercise Oxygen Prescription  Continuous    Liters per minute  10    Home at Rest Exercise Oxygen Prescription  Continuous    Liters per minute  2    Compliance with Home Oxygen Use  Yes      Goals/Expected Outcomes   Short Term Goals  To learn and exhibit compliance with exercise, home and travel O2 prescription;To learn and understand importance of monitoring SPO2 with pulse  oximeter and demonstrate accurate use of the pulse oximeter.;To learn and understand importance of maintaining oxygen saturations>88%;To learn and demonstrate proper pursed lip breathing  techniques or other breathing techniques.;To learn and demonstrate proper use of respiratory medications    Long  Term Goals  Exhibits compliance with exercise, home and travel O2 prescription;Verbalizes importance of monitoring SPO2 with pulse oximeter and return demonstration;Maintenance of O2 saturations>88%;Exhibits proper breathing techniques, such as pursed lip breathing or other method taught during program session;Compliance with respiratory medication;Demonstrates proper use of MDI's    Goals/Expected Outcomes  Compliance       Initial Exercise Prescription: Initial Exercise Prescription - 01/11/19 1600      Date of Initial Exercise RX and Referring Provider   Date  01/11/19    Referring Provider  Dr. [name]      Oxygen   Oxygen  Continuous    Liters  8-10      Treadmill   MPH  1.6    Grade  1    Minutes  15      NuStep   Level  2    SPM  80    Minutes  15      Prescription Details   Frequency (times per week)  2    Duration  Progress to 30 minutes of continuous aerobic without signs/symptoms of physical distress      Intensity   THRR 40-80% of Max Heartrate  68-136    Ratings of Perceived Exertion  11-13    Perceived Dyspnea  0-4      Progression   Progression  Continue progressive overload as per policy without signs/symptoms or physical distress.      Resistance Training   Training Prescription  Yes    Weight  orange bands    Reps  10-15       Perform Capillary Blood Glucose checks as needed.  Exercise Prescription Changes: Exercise Prescription Changes    Row Name 01/16/19 1139 02/01/19 1135           Response to Exercise   Blood Pressure (Admit)  128/80  104/70      Blood Pressure (Exercise)  140/88  100/64      Blood Pressure (Exit)  118/84  104/60      Heart Rate (Admit)  101 bpm  93 bpm      Heart Rate (Exercise)  128 bpm  118 bpm      Heart Rate (Exit)  100 bpm  100 bpm      Oxygen Saturation (Admit)  99 %  97 %      Oxygen  Saturation (Exercise)  94 %  90 %      Oxygen Saturation (Exit)  98 %  97 %      Rating of Perceived Exertion (Exercise)  12  12      Perceived Dyspnea (Exercise)  1.5  2      Duration  Continue with 30 min of aerobic exercise without signs/symptoms of physical distress.  Continue with 30 min of aerobic exercise without signs/symptoms of physical distress.      Intensity  Other (comment) 40-80% HRR  THRR unchanged        Progression   Progression  Continue to progress workloads to maintain intensity without signs/symptoms of physical distress.  Continue to progress workloads to maintain intensity without signs/symptoms of physical distress.        Resistance Training   Training Prescription  Yes  Yes  Weight  orange bands  orange bands      Reps  10-15  10-15      Time  10 Minutes  10 Minutes        Interval Training   Interval Training  No  --        Oxygen   Oxygen  Continuous  Continuous      Liters  8-10 L  8-10         Treadmill   MPH  1.6  --      Grade  1  --      Minutes  15  --        NuStep   Level  2  2      SPM  80  80      Minutes  15  30      METs  1.6  --         Exercise Comments:   Exercise Goals and Review: Exercise Goals    Row Name 01/11/19 1633 02/05/19 1121           Exercise Goals   Increase Physical Activity  Yes  Yes      Intervention  Provide advice, education, support and counseling about physical activity/exercise needs.;Develop an individualized exercise prescription for aerobic and resistive training based on initial evaluation findings, risk stratification, comorbidities and participant's personal goals.  Provide advice, education, support and counseling about physical activity/exercise needs.;Develop an individualized exercise prescription for aerobic and resistive training based on initial evaluation findings, risk stratification, comorbidities and participant's personal goals.      Expected Outcomes  Short Term: Attend rehab on a  regular basis to increase amount of physical activity.;Long Term: Add in home exercise to make exercise part of routine and to increase amount of physical activity.;Long Term: Exercising regularly at least 3-5 days a week.  Short Term: Attend rehab on a regular basis to increase amount of physical activity.;Long Term: Add in home exercise to make exercise part of routine and to increase amount of physical activity.;Long Term: Exercising regularly at least 3-5 days a week.      Increase Strength and Stamina  Yes  Yes      Intervention  Provide advice, education, support and counseling about physical activity/exercise needs.;Develop an individualized exercise prescription for aerobic and resistive training based on initial evaluation findings, risk stratification, comorbidities and participant's personal goals.  Provide advice, education, support and counseling about physical activity/exercise needs.;Develop an individualized exercise prescription for aerobic and resistive training based on initial evaluation findings, risk stratification, comorbidities and participant's personal goals.      Expected Outcomes  Short Term: Increase workloads from initial exercise prescription for resistance, speed, and METs.;Short Term: Perform resistance training exercises routinely during rehab and add in resistance training at home;Long Term: Improve cardiorespiratory fitness, muscular endurance and strength as measured by increased METs and functional capacity (6MWT)  Short Term: Increase workloads from initial exercise prescription for resistance, speed, and METs.;Short Term: Perform resistance training exercises routinely during rehab and add in resistance training at home;Long Term: Improve cardiorespiratory fitness, muscular endurance and strength as measured by increased METs and functional capacity (6MWT)      Able to understand and use rate of perceived exertion (RPE) scale  Yes  Yes      Intervention  Provide education  and explanation on how to use RPE scale  Provide education and explanation on how to use RPE scale      Expected  Outcomes  Short Term: Able to use RPE daily in rehab to express subjective intensity level;Long Term:  Able to use RPE to guide intensity level when exercising independently  Short Term: Able to use RPE daily in rehab to express subjective intensity level;Long Term:  Able to use RPE to guide intensity level when exercising independently      Able to understand and use Dyspnea scale  Yes  Yes      Intervention  Provide education and explanation on how to use Dyspnea scale  Provide education and explanation on how to use Dyspnea scale      Expected Outcomes  Short Term: Able to use Dyspnea scale daily in rehab to express subjective sense of shortness of breath during exertion;Long Term: Able to use Dyspnea scale to guide intensity level when exercising independently  Short Term: Able to use Dyspnea scale daily in rehab to express subjective sense of shortness of breath during exertion;Long Term: Able to use Dyspnea scale to guide intensity level when exercising independently      Knowledge and understanding of Target Heart Rate Range (THRR)  Yes  Yes      Intervention  Provide education and explanation of THRR including how the numbers were predicted and where they are located for reference  Provide education and explanation of THRR including how the numbers were predicted and where they are located for reference      Expected Outcomes  Short Term: Able to state/look up THRR;Long Term: Able to use THRR to govern intensity when exercising independently;Short Term: Able to use daily as guideline for intensity in rehab  Short Term: Able to state/look up THRR;Long Term: Able to use THRR to govern intensity when exercising independently;Short Term: Able to use daily as guideline for intensity in rehab      Understanding of Exercise Prescription  Yes  Yes      Intervention  Provide education,  explanation, and written materials on patient's individual exercise prescription  Provide education, explanation, and written materials on patient's individual exercise prescription      Expected Outcomes  Short Term: Able to explain program exercise prescription;Long Term: Able to explain home exercise prescription to exercise independently  Short Term: Able to explain program exercise prescription;Long Term: Able to explain home exercise prescription to exercise independently         Exercise Goals Re-Evaluation : Exercise Goals Re-Evaluation    [place] Name 02/05/19 1122             Exercise Goal Re-Evaluation   Exercise Goals Review  Increase Physical Activity;Increase Strength and Stamina;Able to understand and use rate of perceived exertion (RPE) scale;Able to understand and use Dyspnea scale;Knowledge and understanding of Target Heart Rate Range (THRR);Understanding of Exercise Prescription       Comments  Pt has completed 3 exercise sessions. Pt missed a couple of visits following her last chemo session. Pt returned and experienced weakness. Pt currently exercises at 1.7 METs on the stepper. Will continue to monitor and progress as able.       Expected Outcomes  Through exercise at rehab and at home, the patient will decrease shortness of breath with daily activities and feel confident in carrying out an exercise regime at home.           Discharge Exercise Prescription (Final Exercise Prescription Changes): Exercise Prescription Changes - 02/01/19 1135      Response to Exercise   Blood Pressure (Admit)  104/70    Blood Pressure (Exercise)  100/64    Blood Pressure (Exit)  104/60    Heart Rate (Admit)  93 bpm    Heart Rate (Exercise)  118 bpm    Heart Rate (Exit)  100 bpm    Oxygen Saturation (Admit)  97 %    Oxygen Saturation (Exercise)  90 %    Oxygen Saturation (Exit)  97 %    Rating of Perceived Exertion (Exercise)  12    Perceived Dyspnea (Exercise)  2    Duration   Continue with 30 min of aerobic exercise without signs/symptoms of physical distress.    Intensity  THRR unchanged      Progression   Progression  Continue to progress workloads to maintain intensity without signs/symptoms of physical distress.      Resistance Training   Training Prescription  Yes    Weight  orange bands    Reps  10-15    Time  10 Minutes      Oxygen   Oxygen  Continuous    Liters  8-10       NuStep   Level  2    SPM  80    Minutes  30       Nutrition:  Target Goals: Understanding of nutrition guidelines, daily intake of sodium <1571m, cholesterol <2080m calories 30% from fat and 7% or less from saturated fats, daily to have 5 or more servings of fruits and vegetables.  Biometrics: Pre Biometrics - 01/11/19 1502      Pre Biometrics   Grip Strength  18 kg        Nutrition Therapy Plan and Nutrition Goals: Nutrition Therapy & Goals - 02/01/19 1408      Nutrition Therapy   Diet  Mediterranean      Personal Nutrition Goals   Nutrition Goal  Pt to identify food quantities necessary to achieve weight loss of 6-24 lb at graduation from cardiac rehab.    Personal Goal #2  Pt to build a healthy plate including vegetables, fruits, whole grains, and low-fat dairy products in a heart healthy meal plan.      Intervention Plan   Intervention  Prescribe, educate and counsel regarding individualized specific dietary modifications aiming towards targeted core components such as weight, hypertension, lipid management, diabetes, heart failure and other comorbidities.;Nutrition handout(s) given to patient.    Expected Outcomes  Short Term Goal: Understand basic principles of dietary content, such as calories, fat, sodium, cholesterol and nutrients.;Short Term Goal: A plan has been developed with personal nutrition goals set during dietitian appointment.;Long Term Goal: Adherence to prescribed nutrition plan.       Nutrition Assessments: Nutrition Assessments -  02/01/19 1436      Rate Your Plate Scores   Pre Score  46       Nutrition Goals Re-Evaluation: Nutrition Goals Re-Evaluation    [place] 02/01/19 1435             Goals   Current Weight  221 lb (100.2 kg)       Nutrition Goal  Pt to identify food quantities necessary to achieve weight loss of 6-24 lb at graduation from cardiac rehab.         Personal Goal #2 Re-Evaluation   Personal Goal #2  Pt to build a healthy plate including vegetables, fruits, whole grains, and low-fat dairy products in a heart healthy meal plan.          Nutrition Goals Discharge (Final Nutrition Goals Re-Evaluation): Nutrition Goals Re-Evaluation - 02/01/19  1435      Goals   Current Weight  221 lb (100.2 kg)    Nutrition Goal  Pt to identify food quantities necessary to achieve weight loss of 6-24 lb at graduation from cardiac rehab.      Personal Goal #2 Re-Evaluation   Personal Goal #2  Pt to build a healthy plate including vegetables, fruits, whole grains, and low-fat dairy products in a heart healthy meal plan.       Psychosocial: Target Goals: Acknowledge presence or absence of significant depression and/or stress, maximize coping skills, provide positive support system. Participant is able to verbalize types and ability to use techniques and skills needed for reducing stress and depression.  Initial Review & Psychosocial Screening: Initial Psych Review & Screening - 01/11/19 1505      Initial Review   Current issues with  Current Stress Concerns    Source of Stress Concerns  Chronic Illness    Comments  Is in need of losing weight so she can have a lung transplant, will need to go down to 180 pounds      [place]?  Yes      Barriers   Psychosocial barriers to participate in program  There are no identifiable barriers or psychosocial needs.      Screening Interventions   Interventions  Encouraged to exercise       Quality of Life Scores:  Scores of  19 and below usually indicate a poorer quality of life in these areas.  A difference of  2-3 points is a clinically meaningful difference.  A difference of 2-3 points in the total score of the Quality of Life Index has been associated with significant improvement in overall quality of life, self-image, physical symptoms, and general health in studies assessing change in quality of life.  PHQ-9: Recent Review Flowsheet Data    Depression screen [facility] 2/9 01/11/2019 02/13/2018 12/22/2017 08/10/2017   Decreased Interest 0 _0 Down, Depressed, Hopeless 0 1 0 1   PHQ - 2 Score 0 _1 Altered sleeping _2 Tired, decreased energy _3 Change in appetite _4 Feeling bad or failure about yourself  0 _5 Trouble concentrating 0 _6 Moving slowly or fidgety/restless 0 1 0 0   Suicidal thoughts 0 0 0 0   PHQ-9 Score - _7 Difficult doing work/chores Not difficult at all Very difficult Somewhat difficult Very difficult     Interpretation of Total Score  Total Score Depression Severity:  1-4 = Minimal depression, 5-9 = Mild depression, 10-14 = Moderate depression, 15-19 = Moderately severe depression, 20-27 = Severe depression   Psychosocial Evaluation and Intervention: Psychosocial Evaluation - 01/11/19 1506      Psychosocial Evaluation & Interventions   Interventions  Encouraged to exercise with the program and follow exercise prescription;Stress management education    Continue Psychosocial Services   No Follow up required       Psychosocial Re-Evaluation: Psychosocial Re-Evaluation    [place] Name 02/05/19 1058             Psychosocial Re-Evaluation   Current issues with  Current Stress Concerns       Comments  Just started the program, has attended 3 exercise sessions, was absent for 1 week d/t side effects of chemotherapy.  She is considering a lung transplant but needs to lose weight and has not lost any.       Expected Outcomes  No barriers or  psychosocial concerns while in pulmonary rehab and that       Interventions  Stress management education;Relaxation education given a meditation handout       Continue Psychosocial Services   Follow up required by staff         Initial Review   Source of Stress Concerns  Chronic Illness;Unable to participate in former interests or hobbies;Unable to perform yard/household activities a possible lung transplant          Psychosocial Discharge (Final Psychosocial Re-Evaluation): Psychosocial Re-Evaluation - 02/05/19 1058      Psychosocial Re-Evaluation   Current issues with  Current Stress Concerns    Comments  Just started the program, has attended 3 exercise sessions, was absent for 1 week d/t side effects of chemotherapy.  She is considering a lung transplant but needs to lose weight and has not lost any.    Expected Outcomes  No barriers or psychosocial concerns while in pulmonary rehab and that    Interventions  Stress management education;Relaxation education   given a meditation handout   Continue Psychosocial Services   Follow up required by staff      Initial Review   Source of Stress Concerns  Chronic Illness;Unable to participate in former interests or hobbies;Unable to perform yard/household activities   a possible lung transplant      Education: Education Goals: Education classes will be provided on a weekly basis, covering required topics. Participant will state understanding/return demonstration of topics presented.  Learning Barriers/Preferences:   Education Topics: Risk Factor Reduction:  -Group instruction that is supported by a PowerPoint presentation. Instructor discusses the definition of a risk factor, different risk factors for pulmonary disease, and how the heart and lungs work together.     PULMONARY REHAB OTHER RESPIRATORY from 02/01/2019 in [place]  Date  01/16/19  Educator  -- [Handout]      Nutrition for Pulmonary  Patient:  -Group instruction provided by PowerPoint slides, verbal discussion, and written materials to support subject matter. The instructor gives an explanation and review of healthy diet recommendations, which includes a discussion on weight management, recommendations for fruit and vegetable consumption, as well as protein, fluid, caffeine, fiber, sodium, sugar, and alcohol. Tips for eating when patients are short of breath are discussed.   PULMONARY REHAB OTHER RESPIRATORY from 12/15/2017 in [place]  Date  10/20/17  Educator  Rodman Pickle  Instruction Review Code  2- Demonstrated Understanding      Pursed Lip Breathing:  -Group instruction that is supported by demonstration and informational handouts. Instructor discusses the benefits of pursed lip and diaphragmatic breathing and detailed demonstration on how to preform both.     Oxygen Safety:  -Group instruction provided by PowerPoint, verbal discussion, and written material to support subject matter. There is an overview of "What is Oxygen" and "Why do we need it".  Instructor also reviews how to create a safe environment for oxygen use, the importance of using oxygen as prescribed, and the risks of noncompliance. There is a brief discussion on traveling with oxygen and resources the patient may utilize.   PULMONARY REHAB OTHER RESPIRATORY from 12/15/2017 in [place]  Date  11/24/17  Educator  Cloyde Reams  Instruction Review Code  1- Verbalizes Understanding  Oxygen Equipment:  -Group instruction provided by [facility] Staff utilizing handouts, written materials, and equipment demonstrations.   Signs and Symptoms:  -Group instruction provided by written material and verbal discussion to support subject matter. Warning signs and symptoms of infection, stroke, and heart attack are reviewed and when to call the physician/911 reinforced. Tips for preventing the spread of  infection discussed.   PULMONARY REHAB OTHER RESPIRATORY from 12/15/2017 in [place]  Date  09/01/17  Educator  RN  Instruction Review Code  1- Verbalizes Understanding      Advanced Directives:  -Group instruction provided by verbal instruction and written material to support subject matter. Instructor reviews Advanced Directive laws and proper instruction for filling out document.   Pulmonary Video:  -Group video education that reviews the importance of medication and oxygen compliance, exercise, good nutrition, pulmonary hygiene, and pursed lip and diaphragmatic breathing for the pulmonary patient.   Exercise for the Pulmonary Patient:  -Group instruction that is supported by a PowerPoint presentation. Instructor discusses benefits of exercise, core components of exercise, frequency, duration, and intensity of an exercise routine, importance of utilizing pulse oximetry during exercise, safety while exercising, and options of places to exercise outside of rehab.     PULMONARY REHAB OTHER RESPIRATORY from 12/15/2017 in [place]  Date  09/22/17  Educator  Cloyde Reams  Instruction Review Code  1- Verbalizes Understanding      Pulmonary Medications:  -Verbally interactive group education provided by instructor with focus on inhaled medications and proper administration.   PULMONARY REHAB OTHER RESPIRATORY from 12/15/2017 in [place]  Date  11/01/17  Educator  pharm      Anatomy and Physiology of the Respiratory System and Intimacy:  -Group instruction provided by PowerPoint, verbal discussion, and written material to support subject matter. Instructor reviews respiratory cycle and anatomical components of the respiratory system and their functions. Instructor also reviews differences in obstructive and restrictive respiratory diseases with examples of each. Intimacy, Sex, and Sexuality  differences are reviewed with a discussion on how relationships can change when diagnosed with pulmonary disease. Common sexual concerns are reviewed.   PULMONARY REHAB OTHER RESPIRATORY from 12/15/2017 in [place]  Date  10/13/17  Educator  rn  Instruction Review Code  2- Demonstrated Understanding      MD DAY -A group question and answer session with a medical doctor that allows participants to ask questions that relate to their pulmonary disease state.   PULMONARY REHAB OTHER RESPIRATORY from 12/15/2017 in [place]  Date  12/15/17  Educator  yacoub  Instruction Review Code  2- Demonstrated Understanding      OTHER EDUCATION -Group or individual verbal, written, or video instructions that support the educational goals of the pulmonary rehab program.   PULMONARY REHAB OTHER RESPIRATORY from 02/01/2019 in [place]  Date  02/01/19  Educator  DF [meditation]  Instruction Review Code  2- Demonstrated Understanding      Holiday Eating Survival Tips:  -Group instruction provided by PowerPoint slides, verbal discussion, and written materials to support subject matter. The instructor gives patients tips, tricks, and techniques to help them not only survive but enjoy the holidays despite the onslaught of food that accompanies the holidays.   Knowledge Questionnaire Score: Knowledge Questionnaire Score - 01/11/19 1556      Knowledge Questionnaire Score   Pre Score  17/18       Core Components/Risk Factors/Patient Goals at Admission: Personal Goals and Risk Factors at Admission - 01/11/19 1507      Core Components/Risk Factors/Patient Goals on Admission    Weight Management  Yes;Weight Loss    Admit Weight  224 lb 13.9 oz (102 kg)    Goal Weight: Short Term  220 lb 7.4 oz (100 kg)    Goal Weight: Long Term  180 lb (81.6 kg)    Improve shortness of breath with ADL's  Yes     Intervention  Provide education, individualized exercise plan and daily activity instruction to help decrease symptoms of SOB with activities of daily living.    Expected Outcomes  Short Term: Improve cardiorespiratory fitness to achieve a reduction of symptoms when performing ADLs;Long Term: Be able to perform more ADLs without symptoms or delay the onset of symptoms       Core Components/Risk Factors/Patient Goals Review:  Goals and Risk Factor Review    [place] Name 01/11/19 1514 02/05/19 1110           Core Components/Risk Factors/Patient Goals Review   Personal Goals Review  Weight Management/Obesity;Develop more efficient breathing techniques such as purse lipped breathing and diaphragmatic breathing and practicing self-pacing with activity.;Stress;Increase knowledge of respiratory medications and ability to use respiratory devices properly.;Improve shortness of breath with ADL's  Weight Management/Obesity;Increase knowledge of respiratory medications and ability to use respiratory devices properly.;Improve shortness of breath with ADL's;Stress      Review  --  She just started the program and it is too early to have met any program goals.      Expected Outcomes  --  See admission goals.         Core Components/Risk Factors/Patient Goals at Discharge (Final Review):  Goals and Risk Factor Review - 02/05/19 1110      Core Components/Risk Factors/Patient Goals Review   Personal Goals Review  Weight Management/Obesity;Increase knowledge of respiratory medications and ability to use respiratory devices properly.;Improve shortness of breath with ADL's;Stress    Review  She just started the program and it is too early to have met any program goals.    Expected Outcomes  See admission goals.       ITP Comments:   Comments: ITP REVIEW Pt is making expected progress toward pulmonary rehab goals after completing 3 sessions. Recommend continued exercise, life style modification, education,  and utilization of breathing techniques to increase stamina and strength and decrease shortness of breath with exertion.

## 2019-02-08 ENCOUNTER — Encounter (HOSPITAL_COMMUNITY): Payer: BC Managed Care – PPO

## 2019-02-08 ENCOUNTER — Ambulatory Visit (INDEPENDENT_AMBULATORY_CARE_PROVIDER_SITE_OTHER): Payer: BC Managed Care – PPO | Admitting: Internal Medicine

## 2019-02-08 ENCOUNTER — Telehealth: Payer: Self-pay | Admitting: Internal Medicine

## 2019-02-08 ENCOUNTER — Other Ambulatory Visit: Payer: Self-pay

## 2019-02-08 ENCOUNTER — Encounter: Payer: Self-pay | Admitting: Internal Medicine

## 2019-02-08 ENCOUNTER — Telehealth (HOSPITAL_COMMUNITY): Payer: Self-pay | Admitting: *Deleted

## 2019-02-08 VITALS — BP 126/80 | HR 97 | Ht 62.0 in | Wt 221.0 lb

## 2019-02-08 DIAGNOSIS — J8489 Other specified interstitial pulmonary diseases: Secondary | ICD-10-CM | POA: Diagnosis not present

## 2019-02-08 DIAGNOSIS — J9611 Chronic respiratory failure with hypoxia: Secondary | ICD-10-CM

## 2019-02-08 DIAGNOSIS — J849 Interstitial pulmonary disease, unspecified: Secondary | ICD-10-CM | POA: Diagnosis not present

## 2019-02-08 DIAGNOSIS — Z5181 Encounter for therapeutic drug level monitoring: Secondary | ICD-10-CM

## 2019-02-08 DIAGNOSIS — Z79899 Other long term (current) drug therapy: Secondary | ICD-10-CM

## 2019-02-08 LAB — BASIC METABOLIC PANEL
BUN: 14 mg/dL (ref 6–23)
CO2: 34 mEq/L — ABNORMAL HIGH (ref 19–32)
Calcium: 9.5 mg/dL (ref 8.4–10.5)
Chloride: 94 mEq/L — ABNORMAL LOW (ref 96–112)
Creatinine, Ser: 0.81 mg/dL (ref 0.40–1.20)
GFR: 74.71 mL/min (ref >60.00)
Glucose, Bld: 105 mg/dL — ABNORMAL HIGH (ref 70–99)
Potassium: 4 mEq/L (ref 3.5–5.1)
Sodium: 134 mEq/L — ABNORMAL LOW (ref 135–145)

## 2019-02-08 LAB — HEPATIC FUNCTION PANEL
ALT: 31 U/L (ref 0–35)
AST: 20 U/L (ref 0–37)
Albumin: 3.9 g/dL (ref 3.5–5.2)
Alkaline Phosphatase: 62 U/L (ref 39–117)
Bilirubin, Direct: 0.1 mg/dL (ref 0.0–0.3)
Total Bilirubin: 0.3 mg/dL (ref 0.2–1.2)
Total Protein: 6.4 g/dL (ref 6.0–8.3)

## 2019-02-08 LAB — URINALYSIS
Bilirubin Urine: NEGATIVE
Hgb urine dipstick: NEGATIVE
Leukocytes,Ua: NEGATIVE
Nitrite: NEGATIVE
Specific Gravity, Urine: 1.025 (ref 1.000–1.030)
Total Protein, Urine: NEGATIVE
Urine Glucose: NEGATIVE
Urobilinogen, UA: 0.2 (ref 0.0–1.0)
pH: 6 (ref 5.0–8.0)

## 2019-02-08 LAB — CBC WITH DIFFERENTIAL/PLATELET
Basophils Absolute: 0.1 K/uL (ref 0.0–0.1)
Basophils Relative: 0.5 % (ref 0.0–3.0)
Eosinophils Absolute: 0.1 K/uL (ref 0.0–0.7)
Eosinophils Relative: 0.6 % (ref 0.0–5.0)
HCT: 38.9 % (ref 36.0–46.0)
Hemoglobin: 12.7 g/dL (ref 12.0–15.0)
Lymphocytes Relative: 8.1 % — ABNORMAL LOW (ref 12.0–46.0)
Lymphs Abs: 0.8 K/uL (ref 0.7–4.0)
MCHC: 32.7 g/dL (ref 30.0–36.0)
MCV: 93 fl (ref 78.0–100.0)
Monocytes Absolute: 1 K/uL (ref 0.1–1.0)
Monocytes Relative: 9.8 % (ref 3.0–12.0)
Neutro Abs: 8.1 K/uL — ABNORMAL HIGH (ref 1.4–7.7)
Neutrophils Relative %: 81 % — ABNORMAL HIGH (ref 43.0–77.0)
Platelets: 155 K/uL (ref 150.0–400.0)
RBC: 4.18 Mil/uL (ref 3.87–5.11)
RDW: 14.2 % (ref 11.5–15.5)
WBC: 10.1 K/uL (ref 4.0–10.5)

## 2019-02-08 LAB — PHOSPHORUS: Phosphorus: 3.4 mg/dL (ref 2.3–4.6)

## 2019-02-08 LAB — BRAIN NATRIURETIC PEPTIDE: Pro B Natriuretic peptide (BNP): 32 pg/mL (ref 0.0–100.0)

## 2019-02-08 LAB — MAGNESIUM: Magnesium: 2 mg/dL (ref 1.5–2.5)

## 2019-02-08 NOTE — Patient Instructions (Addendum)
ICD-10-CM   1. Chronic respiratory failure with hypoxia (HCC)  J96.11   2. ILD (interstitial lung disease) (HCC)  J84.9 D-Dimer, Quantitative    Procalcitonin    B Nat Peptide    CBC w/Diff    Basic Metabolic Panel (BMET)    Magnesium    Phosphorus    Hepatic function panel    Thiopurine methyltransferase(tpmt)rbc  3. 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 Therapeutic drug monitoring  Z51.81   5. High risk medication use  Z79.899      Intolerant to cytoxan escalation  Glad you are some better than several days ago  Also concerned that > 1 year of cytoxan and further cytoxan increases risk of cytoxan related lung injury  Plan   - do blood work 02/08/2019\  - and based on blood work will decide if any furthe test - check TPMT blood work 02/08/2019 in case we decide on immuran - do urine analysis 02/08/2019 - stop cytoxan  - stop premarin for now - CMA will with [name] Parret to ensure portocath is kept flushed when not in use  - for now we wil keep it but over next 4-8 weeks wil make plans to remove it  - continue ofev 130m twice daily  - I think this is helping  - if blood work without infection evidence, will call and might ask you to stop bactrim because you are not on cytoxan anymore - continue oxygen as before  - be careful not to allow desaturation - continue prednisone 141mper day after the 5 day increase on 02/05/2019  - do HRCT this month dec 2020 but based on blood work (might add CT angio if d-dimer high)  Followup -  4-6 weeks from 02/08/2019 ILD clinic or 30 min slot - we can discuss what our future add-on therapy options are - esbriet v immuran v nothing

## 2019-02-08 NOTE — Progress Notes (Signed)
-Lung function November 08, 2017: FVC 0.9 L / 27%, DLCO 5.86/29%  - HRCT 11/08/17 Rt diaphragm elevation ? Eventration v paresis.  Esophagogram November 11, 2017 - > 11/11/17 Tasha Ballard - Normal  Dr. Threasa Alpha at [facility].She was started on  IOV 11/22/2017  Chief Complaint  Patient presents with  . Consult    consut due to pulmonary fibrosis. Pt has been followed by [facility] and last PFT was peformed 8/14 at [facility]. Pt does have complaints of SOB with exertion which she states has become better due to rehab. Pt also has a dry hacking cough with mild production of mucus. Denies any CP.    Is seen by presents to the ILD clinic.  This is her first visit with [name].  History is obtained from her and review of the [place] where she is to follow-up with Dr. Hortencia Pilar.  Interstitial lung disease clinic integrated interstitial lung disease questionnaire: Symptoms: Shortness of breath for years.  Gradually worse.  Currently level 5 shortness of breath walking up stairs or walking up a hill with oxygen.  Level for shortness of breath for sweeping or vacuuming and level 3 shortness of breath for shopping or picking up things and doing laundry.  Level 3 shortness of breath or standing up from a chair or taking a shower making the bed and level 1 dyspnea for brushing teeth.  She also has a cough for 6 years which is getting worse.  On and off that is phlegm.  Occasional wheezing present there is associated fatigue.  She tells me that she had undiagnosed and unrecognized ILD for years.  This was then picked up at [facility] and then she got referred to [facility].  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  At this time she was placed on CellCept.  Then in 2016 she got somewhat worse and was placed on 2 L nasal cannula continuously.  In 2017 she tells me that she had a aortic aneurysm and this was  repaired at [facility].  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  And then for the last year and a half or so she has been on significantly worsening hypoxemia.  In the spring 2019 she is requiring 8 L at rest and 15 L at exertion.  Apparently Dr. Lauris Chroman then recommended Rituxan infusions which she got to 60 days apart with the last one being approximately in April of May 2019.  After that she is somewhat improved requiring only 9-10 L with exertion.  She has been attending pulmonary rehabilitation which has been helping.  She is obese and unable to lose weight although she is lost some.  She believes a lot of her oxygen needs might be related to deviated nasal septum/septal perforation.  She subsequently followed up with Dr. Ruthann Cancer at [facility] in the summer 2019.  I reviewed the notes.  Palliative care was recommended because of progressive respiratory failure.  She has never seen the transplant clinic at [facility] but she recommends that because of obesity and diaphragmatic dysfunction that she would not be a good candidate for transplant .  She was then frustrated with experience because she wants to fight the disease.  She then drove to [facility] and saw Dr. Elisabeth Cara at [facility] 1 week ago.  She spent substantial number  of days there.  At this point in time she showed me an email from him that suggest he might have myositis-ILD but they would like to review her CT scans and pathology from [facility].  Apparently she had significant amount of blood work and other tests at [facility]  I do not have access to these records at this point.  She believes that she wants to undergo transplant.  She thinks that she would be able to have transplant in [place] despite a high BMI.  Currently in [place] she is somewhat stable attending pulmonary rehabilitation.  She is interested in pulmonary trials  Past  medical history  -Tested for sleep apnea at [facility] and was told to be negative.  But at [facility] in September 2019 they recommended a repeat sleep study -Denied all forms of connective tissue disease diagnosis and vasculitis.  Although at [facility] September 2019 when she was seen last week she was told she might have myositis-ILD but this is pending confirmation in the multidisciplinary case conference -She does report positive for diabetes and thyroid disease and mononucleosis in the past but denies any tuberculosis or kidney disease   Review of systems -Positive for fatigue for a few years, arthralgia for a few years but denies dysphagia.  Positive for dry eyes and mouth for few years.  No Raynaud.  No recurrent fever.  No weight loss.  No acid reflux but does admit to snoring for a few years.  No rash no ulcers.  Family history of lung disease COPD and a long but no pulmonary fibrosis.  Dad did have rheumatoid arthritis  Personal exposure history Denies tobacco, vaping, marijuana, cocaine, IV drugs  Home and hobby history  [place] which is 50 years old  Occupational history -She does stay in a condition spaces.  122 point occupational questionnaire completely negative  Medication history -She has a history of allergy to Macrobid/nitrofurantoin.  Do not know when she took it.  She is currently on immunosuppressive regimen of CellCept, Bactrim and prednisone.  CellCept since 2013 and prednisone since 2012.  She did get Rituxan x2 doses with the last one being in spring 2019.  She feels her hypoxemia improved somewhat after that.     has a past medical history of Aneurysm ([place]), Diabetes mellitus without complication ([place]), and Thyroid disease.   reports that she has never smoked. She has never used smokeless tobacco.   OV 12/21/2017  Subjective:  Patient ID: Tasha Ballard, female , DOB: January 26, 1971 , age 17 y.o. , MRN: [id number] ,  ADDRESS: [address] [place] [address]   12/21/2017 -   Chief Complaint  Patient presents with  . Follow-up    ILD     HPI ARYSSA ROSAMOND 48 y.o. -returns for follow-up.  She brings her mother along who I am meeting for the first time.  This visit is specifically focused on evaluation from [place].  Yesterday I did speak to the interstitial lung disease specialist at [facility] Dr. Threasa Alpha.  He told me he was finally able to receive and review the surgical lung biopsy from [facility].  He and his pathologist agreed with the diagnosis of progressive NSIP.  His main recommendation was to switch patient to Cytoxan.  In addition also aggressively pursue weight loss even if it means for bariatric surgery approach so we can get ready for transplant.  He agreed the prognosis is very poor.  At this point in time patient compared to the last visit is stable.  She is using more than 10 L of oxygen up to 15 L of oxygen.  She is interested in Cytoxan.  Dr. Elisabeth Cara indicated to me that efficacy wise oral and IV are similar but he felt IV would be a little bit safer from a urologic standpoint.  Patient is interested in weight loss.  She says she has had difficulty losing weight despite being hypocaloric.  She is interested in nutritional referral.  In addition she wants a sleep specialist referral.  She says [place] OSA was ruled out.  She has significant insomnia and daytime tiredness.  She said that [place] recommended sleep referral ASAP.  She is registered to participate in the ILD-pro registry study for progressive non-IPF disease.  This was done today.   Investigations at [facility] -November 09, 2017: Echocardiogram ejection fraction 70-75% with grade 2 diastolic dysfunction.  Right ventricle was mildly enlarged but global RV function was felt to be normal.  No pericardial effusion no obvious right-to-left shunt at rest or cough or Valsalva on agitated  saline contrast exam and stage I right ventricular diastolic dysfunction.  Pulmonary artery systolic pressure at 45 mmHg  -Lung function November 08, 2017: FVC 0.9 L / 27%, DLCO 5.86/29%  - HRCT 11/08/17 Rt diaphragm elevation ? Eventration v paresis. Sniff test recommendd  Esophagogram November 11, 2017 - > 11/11/17 Natil Jewsh - Normal    12/22/2017-telephone encounter - Dr. [name] >>>starting Cytoxan 50 mg/day >>>Avoid lots of fluids to avoid cystitis, avoid intercourse without contraception, return in 7 to 10 days to recheck CBC, Bmet, LFTs, UA  12/22/17-office visit-[place] >>>Plan in lab sleep study be ordered, patient will likely need BiPAP with oxygen  12/30/2017  - Visit   48 year old female patient presenting today for one-week follow-up visit after starting Cytoxan.  Patient also get lab work completed today.  Unfortunately patient did not get this completed prior to her office visit she will get afterwards.  Patient reports baseline symptoms of nonproductive cough.  Requiring 4 to 6 L of oxygen at rest as well as 10 to 15 L of oxygen with exertion.  Patient does have her baseline shortness of breath with exertion.  Patient reporting no worsening symptoms since starting Cytoxan.   Patient reports she typically is on 9 to 10 L of oxygen at home.   OV 01/17/2018  Subjective:  Patient ID: Tasha Ballard, female , DOB: 1970/09/28 , age 64 y.o. , MRN: [id number] , ADDRESS: [address] [place] [address]   01/17/2018 -   Chief Complaint  Patient presents with  . Follow-up    2 week f/u, lung transplant, was contacted and waiting on weight loss to continue   PRogressive NSIP Chronic hypoxemic resp failure Morbid Obesity - needs weight loss for transplant Rx regimen   -  cytoxan 41m per day 12/23/17 and increaed to 1044mon 12/23/17.On11/19/19. increase to 12554mer day  And switched to IV cytoxan Q4 week cycle - 1st cycle 02/03/18.   HPI ChrPERLIE STENE 66.o. -presents for follow-up with her husband.  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toxan 59m32mr day 12/23/17 and increaed to 100mg73m11/1/19.  On 01/10/18.  increase to 125mg 72mday .  Plan is to get blood work today.  She wants a Port-A-Cath placed.  She prefers IV Cytoxan.  I checked with Dr. Threasa Alpha and he feels either p.o. or IV Cytoxan is fine but in the balance IV Cytoxan has a slightly lower incidence of hemorrhagic cystitis and the patient prefers this.  At this point in time she tells me that the Cytoxan seems to be working well for her.  She is less hypoxemic she is able to get by with 5 L at rest.  On the other hand this could be because of weight loss.  However there is some fatigue.  She will have blood work and urine analysis to look for cytotoxicity from Cytoxan.  Based on the newINBUILD  data we discussed about starting nintedanib.  I checked with Dr. Threasa Alpha at [facility] and he is supportive of the plan.  Patient states that she has done extensive research on nintedanib and she is very excited about the possibility of taking this.   Obesity: Weight 231#  11/22/17  -> 221# 01/17/2018.,  She saw the nutritionist at [facility].  She is using an app and tracking her weight on a regular basis.  She is encouraged by the weight loss.  We gave her a low glycemic diet sheet.  She has not been able to see the bariatric surgeon at [facility] surgery but I did run into Dr. Kaylyn Lim in the hallway and he said he would be happy to see her and counsel her.  Although he did acknowledge she would be high risk for surgery.  He is in favor of low carbohydrate diet.  A few weeks ago I met the transplant physician from [facility] and he brought up the idea about losing weight loss drugs.  His preferred drug is phentermine.  We discussed the  possibility about getting an endocrine consult  Lung transplant: [facility] has called her.  They want to BMI less than 30 or 36 I am not sure.  She is working towards his goal.  She has not heard from [facility].  I emailed them and the day after the visit patient is emailed me saying that she has heard from them.  She is very keen to get on the transplant list.    02/03/2018 OV for Direct Admit for IV Cytoxan  Patient presents to the office today for direct admission to [facility] hospital for IV Cytoxan infusion.  She says her breathing is at baseline.  She remains on oxygen 4 to 6 L at rest and 10 to 15 L with activity.  He says her leg swelling is at baseline.  She remains on Lasix 40 mg daily and Aldactone 100 mg.  She is on chronic steroids with prednisone 10 mg daily. She has been on 05 x1 week.  Says she is tolerating with no nausea vomiting or diarrhea.  Does have some mild constipation.  Patient says she had a Port-A-Cath placed 1 week ago. Patient has chronic shortness of breath gets winded with minimal activity.  Has intermittent dry cough.   02/13/2018 Follow up : Progressive NSIP , O2 RF  Patient returns for a one-week follow-up.  Patient was recently admitted for IV Cytoxan under observation status.. Patient had been on oral Cytoxan.  Continue to have progressive worsening of her ILD.  At baseline she is on 4 to 6 L at rest.  And 10 to 15 L with activity.  She is on Aldactone and Lasix.  She has been started on  OFEV 3 weeks ago.  She is tolerating well with no nausea vomiting or diarrhea.  Does have some mild constipation. Since her Cytoxan infusion.  She says she has done well.  She had minimal nausea.  She does feel that her breathing has slightly improved.  Her oxygen demands are decreased.  She is using mainly only 6 L of oxygen.  And doing well with this with good O2 saturations. Says she was able to actually go out to dinner for the first time in a long  time.  She denies any hematuria, chest pain, orthopnea or increased leg swelling.  She is working on weight loss.  She is going to the weight loss center. Labs were done on 12/20 that showed normal Pollitt blood cell count, platelets and kidney function. Urinalysis was done today.  And is pending.. Plans are for a Cytoxan infusion in 3- 4 weeks from her last infusion.  OV 03/02/2018  Subjective:  Patient ID: Tasha Ballard, female , DOB: 14-Sep-1970 , age 53 y.o. , MRN: [id number] , ADDRESS: [address] [place] [address]   03/02/2018 -   Chief Complaint  Patient presents with  . Follow-up    Appt prior to pt's next Cytoxan infusion.  Pt states she is doing well since last visit. States she can tell that her breathing has improved. Has had some nausea/vomiting and wonders if it is from the [place].    HPI ILAISAANE MARTS 48 y.o. -presents for the above issues.  I personally saw her before Thanksgiving 2019.  After that she is seen by nurse practitioner under my supervision as documented above for Cytoxan infusion.  She has had post Cytoxan infusion labs which showed her [place] count is decreasing but still adequate.  Most recent labs was February 13, 2018 at the weight loss clinic with a Hum count of 5900.  Rest of the labs reviewed and appear adequate and normal.  Including liver function test.  She is developed new onset of intermittent nausea and vomiting ever since starting nintedanib early December 2019.  This particularly happens with the Christmas meals.  She says this appears at random and is mild and is treated with Zofran.  Given the beneficial effects of nintedanib and progressive non-IPF ILD she is agreed to continue to take this.  She continues on her chronic prednisone and Bactrim prophylaxis.  She is finished 1 cycle of IV Cytoxan February 03, 2018.  She has upcoming second cycle 4 weeks apart on March 06, 2018.  At this point in time she is feeling well.  She feels her ILD has  actually improved.  On 2 L at rest she was saturating at 98%.  This is a huge improvement for her.  However when we walked her she still needed 8 L of oxygen with exertion.  She still says this is an improvement because in the past she was using more than 10 L for exertion.  Today my nurse practitioner will enter the orders for upcoming infusion.  There is no fever.  Morbid obesity: She has seen Dr. Leafy Ro at the weight loss clinic.  I reviewed the notes.  Dietary measures are indicated.  I have written to Dr. Leafy Ro asking about medications to accelerate weight loss given the fact her lung disease is poor prognosis and she is an urgent need for lung transplant and with weight loss being her main barrier.   Obesity: Weight 231#  11/22/17  -> 221# 01/17/2018., -> 222# on  03/02/2018    OV 03/30/2018  Subjective:  Patient ID: Tasha Ballard, female , DOB: 1970-10-06 , age 70 y.o. , MRN: [id number] , ADDRESS: [place] [address]   03/30/2018 -   Chief Complaint  Patient presents with  . Follow-up    PFT peformed today.  Pt states she has been doing okay since last visit.      HPI SHERINE CORTESE 48 y.o. -presents for follow-up of the above issues.  Her husband is here with her.  In terms of - Obesity: Weight 231#  11/22/17  -> 221# 01/17/2018., -> 222# on 03/02/2018 -> 220# on 03/30/2018  In terms of her progressive NSIP and chronic hypoxemic respiratory failure: She has finished 2 cycles of Cytoxan.  Second cycle was a full dose.  After that she got neutropenic as reviewed on the labs.  During this time she developed a facial rash.  This was on a weekend and over the phone we treated her with doxycycline.  It is largely improved.  Tomorrow she has an appointment with her dermatologist.  I do not know who it is.  Overall she is feeling stable in terms of her IPF.  Her pulmonary function test compared to [facility] shows some improvement.  In fact compared to [place] it is  even better.  She attributes this to both nintedanib and Cytoxan.  She is using 4 L of oxygen at rest and 8 L with exertion.  Her symptom score is documented below with shortness of breath at 21-22.  She always has a lot of fatigue.  In terms of her medicine tolerance particularly with nintedanib she had intermittent nausea and vomiting particularly after eating certain types of foods.  She is managing this with Zofran.  Symptom score is documented below.  Her most recent cycle of Cytoxan was March 06, 2018.  Her next Cytoxan is April 03, 2018.  I discussed with Dr. Julien Nordmann oncologist was a lot of experience with Cytoxan.  He said is okay to go with full dose if I felt so.  Patient herself is very keen on getting a full dose of Cytoxan.  She did not have neutropenic fever although she got neutropenic with the second cycle.     ROS  OV 04/26/2018  Subjective:  Patient ID: Tasha Ballard, female , DOB: 06-29-1970 , age 44 y.o. , MRN: [id number] , ADDRESS: [address] [place] [address]   04/26/2018 -   Chief Complaint  Patient presents with  . Follow-up    Pt states she currently is not feeling too well and believes she might have sinus infection. Pt has had complaints of fatigue, has head congestion, pain in left ear going down to her neck and also has had a sore throat.       HPI RAILEE BONILLAS 48 y.o. - presents for above issues. Here with ? Mom.Since last visit had blood woirk last week and wbc nadir. Had associatd "usual fatigue of low wbc". But then father in law died and she was busy so never recovered from fatigue. Has new wheeze, sinus congestion and increased cruds. Feels she has sinusitis. Wants abx before cycle #4 cytoxan next week. She is worried about COVID-19 ans asked for advice. Otherwise well. Symptom score below      OV 10/20/2018  Subjective:  Patient ID: Tasha Ballard, female , DOB: 1971/01/21 , age 27 y.o. , MRN: [id number] , ADDRESS: [place]  [place] [address]     10/20/2018 -   Chief Complaint  Patient presents with  . ILD (interstitial lung disease) ([place])     HPI SHANITA KANAN 48 y.o. -presents for follow-up of the above issues.  She reports that over summer 2020 her husband nearly got exposed to someone with COVID this was in the outdoor setting.  She never ended up getting COVID.  On October 22, 2018 she tested negative for COVID. On October 04, 2018 she had a virtual visit with nurse practitioner for sinus complaints.  She took a 10-day course of Augmentin.  She was taking this along with Bactrim despite instructions to hold Bactrim.  This gave her immense diarrhea.  She is also reporting that the diarrhea has now improved after finishing the Augmentin course.  However she is having fatigue for the last 1 month.  She feels this because of the oral Cytoxan.  She wants to switch to IV Cytoxan.  She is in need of a flu shot and is willing to have that.  Her Port-A-Cath site is fine.  Currently she is on prednisone, oral Cytoxan, Bactrim prophylaxis and nintedanib.  She maintains herself on 6 L.  Her symptom score and oxygen needs continue to be stable.  Obesity continues to be a barrier for transplant.  In terms of her ILD her last PFT was in February 2020 and CT scan was in  Nov 2019.      OV 01/03/2019  Subjective:  Patient ID: Tasha Ballard, female , DOB: October 09, 1970 , age 61 y.o. , MRN: [id number] , ADDRESS: [address] [place] [address]    01/03/2019 -   Chief Complaint  Patient presents with  . Follow-up    Pt states she has been doing good since last visit and states breathing has been doing well. Pt is doing 6L O2 sitting and 8-10L with exertion.     HPI ANNAJULIA LEWING 48 y.o. -presents for follow-up of the above medical problems.  Last visit was in end of August 2020.  At that point in time we decided to restart her IV Cytoxan because she did not want to do the oral Cytoxan in the midst of the pandemic because  this was making her fatigued and nauseated more.  She is now finished 2 cycles of IV Cytoxan both of the lower dose.  The first cycle was end of September 2020 but after that it looks like she developed some UTI symptoms although the culture did not grow anything.  We gave her antibiotics.  Therefore the second cycle was also at the lower dose.  She is willing to go up to the third cycle.  She feels her quality of life is much better with the IV Cytoxan cyclical regimen.  At this point in time she is finished 1 year of Cytoxan and also 1 year of nintedanib.  Overall she feels stable.  She uses oxygen with rest and exertion fluctuating between 6 and 8 L.  She feels a combination of the 2 has helped although she thinks maybe the nintedanib has helped her more.  She wants to have a repeat Pneumovax today.  She was asking about the Shingrix vaccine which is a live attenuated vaccine.  Her last blood draw was December 29, 2018 with [name] and this was reviewed and is normal although the potassium is 3.5 and magnesium is 1.8 which is slightly below target.  One of her questions was about how long to  continue the Cytoxan regimen.  I will reach out to [facility] for this.  In addition she feels she is hit menopause because she has been having a lot of night sweats.  She is been started on Premarin.  She is also lost some weight.  She continues prednisone at 10 mg/day.  Her symptom scores below shows stability.  Her lung function shows stability.  CT scan shows presence of fibrosis but this no mention about progression.     OV 02/08/2019  Subjective:  Patient ID: Tasha Ballard, female , DOB: 09-30-1970 , age 35 y.o. , MRN: [id number] , ADDRESS: [place] [address]  #Morbid Obesity - needs weight loss for transplant - sees Dr Janeal Holmes since dec 2019 #PRogressive NSIP #Chronic hypoxemic resp failure - 4L at rest, 8L with exertion in dec 2019 - dec 2020 6 to 8/12L # High risk treatment  regimen  -CellCept 2013 through 2019  -Chronic prednisone 2013 -currently taking -Bactrim prophylaxis 2013 --currently taking  - Cytoxan Oral  32m per day 12/23/17 and increaed to 1092mon 12/23/17.On11/19/19. increase to 12512mer day   - Cytoxan IV : And switched to IV cytoxan Q4 week cycle   - 1st cycle 02/03/18 (s/p portocath 01/26/18)  = 2nd cycle 03/06/2018  - 3rd cycle  -  04/03/2018  - 4th cycle - 05/01/2018 - Cytoxan oral (due to pandemic covid-19 and need to avoid infusion visits) - spring 2020 - BAck on IV Cytoxan -   - 5th cycle - end sept 2020  - 6th cycle - end oct 2020  - 7th  Cycle - end nov 2020 (full dose) 01/17/2019   - Ofev 01/23/18 (approximnate)  - Lung transplant needed: Obesity Barrier :  Weight 231#  11/22/17  -> 221# 01/17/2018., -> 222# on 03/02/2018 -> 220# on 03/30/2018 -> 223# on 04/26/2018 -> 224# on 10/20/2018 -> 219# on 01/03/2019 -> 222#   02/08/2019 -   Chief Complaint  Patient presents with  . Follow-up    Pt has had worsening SOB and also has had increased fatigue and weakness. Pt was tested for covid and was negative.     HPI ChrTRINITI GRUETZMACHER 73o. 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  I last saw her in mid November 2020.  After that on January 17, 2019 she underwent Cytoxan infusion.  The particular infusion was an escalation in dose.  She says immediately after that a few hours after that infusion she started having diarrhea.  That was extremely bad.  She called into our office on January 25, 2019 and spoke to the nurse practitioner.  She was asked to hold her nintedanib and Metformin.  She was also asked to hold her Bactrim.  She was given a short course of Diflucan.  After this the diarrhea improved.  Somewhere along the way she got started on Premarin for hot flashes.  It seems she was stabilizing after that but then she had a video visit with the nurse practitioner February 05 2019, 3 days ago.  During  this visit she reported that since December Leven 2020 she was desaturating much easier.  She finished a shower and started feeling she was going to blackout.  Just a few weeks ago she was able to do 14 minutes on a treadmill on 6 L before she desaturated but then on February 02, 2019 she was desaturating to 83% even on 12 L nasal cannula.  She was having sinus symptoms.  She was Covid tested and this was negative.  Nurse practitioner advised her to stop her Premarin.  She also gave her Omnicef for sinusitis.  She increased the prednisone to 20 mg for 5 days which patient is currently on.  After this the patient is supposed to go back on prednisone 10 mg/day.  She says she is now beginning to feel better.  The oxygen levels have stabilized.  But she is having extreme fatigue.  Her primary is on hold.  Symptom scores are listed below.  She is now with her husband to talk future direction in her care.  She is worried about her ability to handle Cytoxan.  She is also worried about her interstitial lung disease progressing.  She has a Port-A-Cath.   Results for Brunkhorst, DEONDRA LABRADOR" (MRN [id number]) as of 02/08/2019 09:44  Ref. Range 01/26/2019 09:32 02/01/2019 09:44  WBC Latest Ref Range: 4.0 - 10.5 K/uL 1.7 Repeated and verified X2. (LL) 2.6 (L)     SYMPTOM SCALE - ILD 03/30/2018  04/26/2018  10/20/2018  01/03/2019  02/08/2019   O2 use  4 L at rest, 8 L with exertion Same but prefers 6L at all times 6L  6L at rest, 8-10 L with exertion 6L rest -> 93%  Shortness of Breath 0 -> 5 scale with 5 being worst (score 6 If unable to do)      At rest 0 1 0 0 0  Simple tasks - showers, clothes change, eating, shaving 1 3.5 3.5 3.5 2  Household (dishes, doing bed, laundry) 2.5 4.5 3._0 Shopping 3 4.5 3._1 Walking level at own pace 3 3.5 3.5 3.5 4  Walking keeping up with others of same age _2 4.5 5  Walking up Stairs 4 5 4._3 Walking up Hill 4 5 4._4 Total (40 - 48) Dyspnea Score 21.5  32 28 29.5 29  How bad is your cough? x 2.5 x mild worse  How bad is your fatigue 5 ("very, lol" 4 Extreme 5+ "lot" awful   Results for Lusty, CALEE NUGENT" (MRN [id number]) as of 01/03/2019 10:52  Ref. Range 03/30/2018 11:50 11/20/2018 15:14  FVC-Pre Latest Units: L 1.09 1.16  FVC-%Pred-Pre Latest Units: % 32 35  Results for Thaden, Arionna Y "[name]" (MRN [id number]) as of 01/03/2019 10:52  Ref. Range 03/30/2018 11:50 11/20/2018 15:14  DLCO unc Latest Units: ml/min/mmHg 6.85 7.29  DLCO unc % pred Latest Units: % 34 37     CT CHest High Resolution 11/20/2018  IMPRESSION: 1. The appearance of the lungs is considered probable usual interstitial pneumonia (UIP) per current ATS guidelines. Repeat high-resolution chest CT is recommended in 12 months to assess for temporal changes in the appearance of the lung parenchyma. 2. Aortic atherosclerosis.  Aortic Atherosclerosis (ICD10-I70.0).   Electronically Signed   By: Vinnie Langton M.D.   On: 11/20/2018 14:16    ROS - per HPI     has a past medical history of Aneurysm ([place]), Back pain, Diabetes mellitus without complication ([place]), H/O blood clots, Hypertension, Joint pain, Pulmonary fibrosis (HCC), SOB (shortness of breath), Swelling, Thyroid disease, and Vitamin D deficiency.   reports that she has never smoked. She has never used smokeless tobacco.  Past Surgical History:  Procedure Laterality Date  . ABLATION    . IR IMAGING GUIDED PORT INSERTION  01/26/2018  . KNEE SURGERY    .  LUNG BIOPSY      Allergies  Allergen Reactions  . Macrobid [Nitrofurantoin Macrocrystal] Nausea And Vomiting  . Adhesive [Tape] Rash    Immunization History  Administered Date(s) Administered  . Influenza,inj,Quad PF,6+ Mos 12/24/2014, 02/05/2016, 01/07/2017, 11/22/2017, 10/20/2018  . Influenza-Unspecified 12/24/2014, 02/05/2016, 01/07/2017, 11/22/2017  . Pneumococcal Conjugate-13 07/20/2017  . Pneumococcal Polysaccharide-23  06/09/2012, 01/03/2019  . Tdap 02/19/2011    Family History  Problem Relation Age of Onset  . Heart failure Mother   . Heart disease Mother   . Rheumatologic disease Father      Current Outpatient Medications:  .  benzonatate (TESSALON) 200 MG capsule, Take 1 capsule (200 mg total) by mouth 3 (three) times daily as needed for cough., Disp: 30 capsule, Rfl: 1 .  carvedilol (COREG) 6.25 MG tablet, Take 6.25 mg by mouth 2 (two) times daily with a meal., Disp: , Rfl:  .  cefdinir (OMNICEF) 300 MG capsule, Take 1 capsule (300 mg total) by mouth 2 (two) times daily., Disp: 14 capsule, Rfl: 0 .  clindamycin (CLEOCIN T) 1 % lotion, , Disp: , Rfl:  .  cyclophosphamide (CYTOXAN) 50 MG capsule, Take 2 capsules (100 mg total) by mouth daily. Give on an empty stomach 1 hour before or 2 hours after meals., Disp: 60 capsule, Rfl: 2 .  furosemide (LASIX) 40 MG tablet, Take 40 mg by mouth See admin instructions. Take 1 tablet (40 mg) daily, may repeat dose if needed for fluid retention., Disp: , Rfl:  .  gabapentin (NEURONTIN) 100 MG capsule, Take 1 capsule by mouth as needed., Disp: , Rfl:  .  magnesium oxide (MAG-OX) 400 MG tablet, Take 1 tablet (400 mg total) by mouth daily., Disp: 30 tablet, Rfl: 5 .  metFORMIN (GLUCOPHAGE) 500 MG tablet, Take 500 mg by mouth 2 (two) times daily. , Disp: , Rfl:  .  montelukast (SINGULAIR) 10 MG tablet, Take 10 mg by mouth daily after supper., Disp: , Rfl:  .  OFEV 150 MG CAPS, Take 150 mg by mouth 2 (two) times daily., Disp: , Rfl: 11 .  ondansetron (ZOFRAN) 8 MG tablet, TAKE 1 TABLET BY MOUTH EVERY 8 HOURS AS NEEDED FOR NAUSEA AND VOMITING, Disp: 45 tablet, Rfl: 2 .  potassium chloride (KLOR-CON) 10 MEQ tablet, TAKE 1 TABLET BY MOUTH 2 TIMES A DAY, Disp: 120 tablet, Rfl: 2 .  predniSONE (DELTASONE) 10 MG tablet, Take 10-58m as needed for SOB, Disp: 30 tablet, Rfl: 3 .  PROAIR HFA 108 (90 Base) MCG/ACT inhaler, Inhale 2 puffs into the lungs every 6 (six) hours as  needed for wheezing or shortness of breath., Disp: , Rfl: 4 .  saccharomyces boulardii (FLORASTOR) 250 MG capsule, Take 1 capsule (250 mg total) by mouth 2 (two) times daily., Disp: 60 capsule, Rfl: 0 .  spironolactone (ALDACTONE) 100 MG tablet, Take 100 mg by mouth daily., Disp: , Rfl:  .  sulfamethoxazole-trimethoprim (BACTRIM) 400-80 MG tablet, TAKE 1 TABLET BY MOUTH ONCE DAILY, Disp: 90 tablet, Rfl: 1 .  thyroid (ARMOUR) 90 MG tablet, Take 90 mg by mouth daily before breakfast. , Disp: , Rfl:  .  Vitamin D, Ergocalciferol, (DRISDOL) 1.25 MG (50000 UT) CAPS capsule, TAKE 1 CAPSULE BY MOUTH ONCE WEEKLY, Disp: 3 capsule, Rfl: 0 .  PREMARIN 0.625 MG tablet, Take 0.625 mg by mouth daily., Disp: , Rfl:       Objective:   Vitals:   02/08/19 0943  BP: 126/80  Pulse: 97  SpO2: 93%  Weight:  221 lb (100.2 kg)  Height: 5' 2" (1.575 m)    Estimated body mass index is 40.42 kg/m as calculated from the following:   Height as of this encounter: 5' 2" (1.575 m).   Weight as of this encounter: 221 lb (100.2 kg).  _0 @  Filed Weights   02/08/19 0943  Weight: 221 lb (100.2 kg)     Physical Exam  General Appearance:    Alert, cooperative, no distress, appears stated age - yes , Deconditioned looking - no , OBESE  - yes, Sitting on Wheelchair -  no  Head:    Normocephalic, without obvious abnormality, atraumatic  Eyes:    PERRL, conjunctiva/corneas clear,  Ears:    Normal TM's and external ear canals, both ears  Nose:   Nares normal, septum midline, mucosa normal, no drainage    or sinus tenderness. OXYGEN ON  - yes . Patient is @ 6L [place]   Throat:   Lips, mucosa, and tongue normal; teeth and gums normal. Cyanosis on lips - no  Neck:   Supple, symmetrical, trachea midline, no adenopathy;    thyroid:  no enlargement/tenderness/nodules; no carotid   bruit or JVD  Back:     Symmetric, no curvature, ROM normal, no CVA tenderness  Lungs:     Distress - no , Wheeze no, Barrell Chest -  no, Purse lip breathing - no, Crackles - faint at lung base   Chest Wall:    No tenderness or deformity.    Heart:    Regular rate and rhythm, S1 and S2 normal, no rub   or gallop, Murmur - no  Breast Exam:    NOT DONE  Abdomen:     Soft, non-tender, bowel sounds active all four quadrants,    no masses, no organomegaly. Visceral obesity - yes  Genitalia:   NOT DONE  Rectal:   NOT DONE  Extremities:   Extremities - normal, Has Cane - no, Clubbing - no, Edema - no  Pulses:   2+ and symmetric all extremities  Skin:   Stigmata of Connective Tissue Disease - no  Lymph nodes:   Cervical, supraclavicular, and axillary nodes normal  Psychiatric:  Neurologic:   Pleasant - yes, Anxious - no, Flat affect - no  CAm-ICU - neg, Alert and Oriented x 3 - yes, Moves all 4s - yes, Speech - normal, Cognition - intact           Assessment:       ICD-10-CM   1. Chronic respiratory failure with hypoxia (HCC)  J96.11   2. ILD (interstitial lung disease) (HCC)  J84.9 D-Dimer, Quantitative    Procalcitonin    B Nat Peptide    CBC w/Diff    Basic Metabolic Panel (BMET)    Magnesium    Phosphorus    Hepatic function panel    Thiopurine methyltransferase(tpmt)rbc    Urinalysis    Urinalysis    Thiopurine methyltransferase(tpmt)rbc    Hepatic function panel    Phosphorus    Magnesium    Basic Metabolic Panel (BMET)    CBC w/Diff    B Nat Peptide    D-Dimer, Quantitative  3. NSIP (nonspecific interstitial pneumonia) ([place])  J84.89   4. Therapeutic drug monitoring  Z51.81   5. High risk medication use  Z79.899   6. Interstitial pulmonary disease (HCC)  J84.9 CT Chest High Resolution   #Long-term ILD I am very concerned about her tolerance to add Cytoxan.  Especially with escalation in  dose she did become neutropenic and a little bit anemic.  But the count seem to be coming up as of February 01, 2019.  In addition I am also more concerned about the low but real risk about Cytoxan related  pneumonitis and lung injury that can happen after 1 year of somebody being on Cytoxan.  For these reasons I have recommended that she stop Cytoxan.  She processed the information well and is accepted it.  She was a little bit disappointed.  We discussed nintedanib.  Research on nintedanib in 2019/2020 shows stabilizing effect with ILD and progressive but non-- IPF phenotype.  Therefore recommended strongly that she continue this.  We discussed future add-on therapies.  She is already been on CellCept but she of progressive disease through the CellCept.  Rituxan or Imuran might be options but she does not have any autoimmune antibodies.  I will have to discuss nationally with experts before making a decision on this.  Another add-on therapy would be pirfenidone.  This phase for data of 6 months of the 2 drugs being combined showing some additive effect but requiring good side effect management especially on the GI side.  However getting insurance approval for this can be very challenging if not impossible.  Requiring pirfenidone abroad might be an option.  I have discussed that I would need to research several of these options before making a decision  Ultimately she needs lung transplant but she would need to lose weight.  #Current intolerance issues -Stop Cytoxan permanently -Do blood work including TPMT for future Imuran potential = Do urine analysis -If there is any suggestion of possible pulmonary embolism making her worse we will get CT angiogram if not we will get high-resolution CT chest to look at evaluation of the ILD  #Miscellaneous -For now continue Bactrim but will stop this depending on the blood work -We will establish Port-A-Cath maintenance while she not getting Cytoxan and then ultimately there is no IV iron therapy we will remove the Port-A-Cath  -Hot flashes: At least for now hold off on Premarin but we might be able to restart this once she is past the current issues after  discussed with the primary care physician  Plan:     Patient Instructions     ICD-10-CM   1. Chronic respiratory failure with hypoxia (HCC)  J96.11   2. ILD (interstitial lung disease) (HCC)  J84.9 D-Dimer, Quantitative    Procalcitonin    B Nat Peptide    CBC w/Diff    Basic Metabolic Panel (BMET)    Magnesium    Phosphorus    Hepatic function panel    Thiopurine methyltransferase(tpmt)rbc  3. NSIP (nonspecific interstitial pneumonia) ([place])  J84.89   4. Therapeutic drug monitoring  Z51.81   5. High risk medication use  Z79.899      Intolerant to cytoxan escalation  Glad you are some better than several days ago  Also concerned that > 1 year of cytoxan and further cytoxan increases risk of cytoxan related lung injury  Plan   - do blood work 02/08/2019\  - and based on blood work will decide if any furthe test - check TPMT blood work 02/08/2019 in case we decide on immuran - do urine analysis 02/08/2019 - stop cytoxan  - stop premarin for now - CMA will with [name] Parret to ensure portocath is kept flushed when not in use  - for now we wil keep it but over next 4-8 weeks wil make plans  to remove it  - continue ofev 185m twice daily  - I think this is helping  - if blood work without infection evidence, will call and might ask you to stop bactrim because you are not on cytoxan anymore - continue oxygen as before  - be careful not to allow desaturation - continue prednisone 172mper day after the 5 day increase on 02/05/2019  - do HRCT this month dec 2020 but based on blood work (might add CT angio if d-dimer high)  Followup -  4-6 weeks from 02/08/2019 ILD clinic or 30 min slot - we can discuss what our future add-on therapy options are - esbriet v immuran v nothing          > 50% of this > 40 min visit spent in  face to face counseling or/and coordination of care - by this undersigned MD - Dr MuBrand MalesThis includes one or more of the following  documented above: discussion of test results, diagnostic or treatment recommendations, prognosis, risks and benefits of management options, instructions, education, compliance or risk-factor reduction    SIGNATURE    Dr. MuBrand MalesM.D., F.C.C.P,  Pulmonary and Critical Care Medicine Staff Physician, Co[place]irector - Interstitial Lung Disease  Program  Pulmonary [id number]: [phone] no answer or between  15:00h - 7:00h: call [phone] Telephone: [phone]  10:39 AM 02/08/2019

## 2019-02-08 NOTE — Telephone Encounter (Signed)
[name] and Raquel Sarna  Could you kindly work together to ensure [employer] is flushed and maintained when not in use. I am taking her off cytoxan permanently due to concern of side effets. Need another 4-6 weeks to figure out what is next add-on Rx if any . 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 Brand Males, M.D., F.C.C.P,  Pulmonary and Critical Care Medicine Staff Physician, [place] Director - Interstitial Lung Disease  Program  P[facility] [place], [place], [address]  Pager: [phone], If no answer or between  15:00h - 7:00h: call [phone] Telephone: [phone]  10:21 AM 02/08/2019

## 2019-02-08 NOTE — Telephone Encounter (Signed)
Called to check on patient, she has been absent from pulmonary rehab and inquiring if she will attend Christmas Eve, she returned call and will be present.

## 2019-02-09 ENCOUNTER — Telehealth (INDEPENDENT_AMBULATORY_CARE_PROVIDER_SITE_OTHER): Payer: BC Managed Care – PPO | Admitting: Adult Health

## 2019-02-09 ENCOUNTER — Encounter: Payer: Self-pay | Admitting: Adult Health

## 2019-02-09 DIAGNOSIS — J9611 Chronic respiratory failure with hypoxia: Secondary | ICD-10-CM

## 2019-02-09 DIAGNOSIS — J40 Bronchitis, not specified as acute or chronic: Secondary | ICD-10-CM | POA: Diagnosis not present

## 2019-02-09 DIAGNOSIS — Z9981 Dependence on supplemental oxygen: Secondary | ICD-10-CM

## 2019-02-09 DIAGNOSIS — J849 Interstitial pulmonary disease, unspecified: Secondary | ICD-10-CM

## 2019-02-09 DIAGNOSIS — W19XXXA Unspecified fall, initial encounter: Secondary | ICD-10-CM

## 2019-02-09 DIAGNOSIS — W010XXA Fall on same level from slipping, tripping and stumbling without subsequent striking against object, initial encounter: Secondary | ICD-10-CM

## 2019-02-09 DIAGNOSIS — Y92009 Unspecified place in unspecified non-institutional (private) residence as the place of occurrence of the external cause: Secondary | ICD-10-CM

## 2019-02-09 NOTE — Patient Instructions (Signed)
Call our office back if your symptoms do not improve or worsen.  You are to contact or seek emergency room care if you develop severe headache, speech or vision changes or extremity weakness or develop neurological changes. Please monitor for the next 24 hours every 2-3 hours neurological status.  Clean abrasions with soap and water pat dry cover if needed watch for signs and symptoms of infection such as redness drainage or fever. If joint pain worsens or does not improve you will need to follow-up with your primary care provider or orthopedics. Mucinex DM twice daily as needed for cough and congestion Incentive spirometer several times a day Continue on oxygen to keep O2 saturations greater than 88 to 90% Hold Premarin .  Continue on Ofev Follow-up with Dr. [name] as planned and as needed Please contact office for sooner follow up if symptoms do not improve or worsen or seek emergency care

## 2019-02-09 NOTE — Telephone Encounter (Signed)
Jess can you call short stay or check with Raquel Sarna how to get her set up for porta cath flushes on routine basis until removal is indicated.

## 2019-02-09 NOTE — Progress Notes (Signed)
Virtual Visit via Video Note  I connected with Tasha Ballard on 02/09/19 at  3:30 PM EST by a video enabled telemedicine application and verified that I am speaking with the correct person using two identifiers.  Location: Patient: Home  Provider: Office    I discussed the limitations of evaluation and management by telemedicine and the availability of in person appointments. The patient expressed understanding and agreed to proceed.  History of Present Illness: 48 year old female never smoker seen for pulmonary consult November 22, 2017 to establish for evaluation of interstitial lung disease.  Patient has been followed at [facility].  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  At that time she was placed on CellCept.  In 2016 patient had a clinical decline and was started on oxygen at 2 L.  She continued to have progressive decline with increased oxygen demands.  Baseline oxygen as at 6 to 8 L at rest and up to maximum of 15 L with heavy activity.  She received a right toxin x2 in April and May 2019.  She has been seen at [facility].  Dr. Threasa Alpha and September 2019.  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  Since surgery she has been progressively getting worse.  In December 2019 she developed progressive respiratory failure and increased symptom burden.  She was started on IV Cytoxan (using IV Cytoxan protocol modified version from [employer] protocol).  She was also started on Ofev December 2019.  Patient has had clinical improvement since beginning Cytoxan and Ofev.  She is had decreased oxygen demands with baseline oxygen at 6 to 8 L at rest and 10 L with activity.  She has been able to participate in pulmonary rehab.  And March 2020 patient was changed from IV Cytoxan to or Cytoxan due to COVID-19 pandemic.  She  was started back on IV Cytoxan November 22, 2018 at 0.5 g/M2.  Second IV Cytoxan effusion was on December 20, 2018 at 0.5 g (lower dose was used due to urinary lab results) third infusion was on January 17, 2019 at 1 g/M square.  Preinfusion labs were normal.  Postinfusion patient had significant and prolonged fatigue malaise and worsening respiratory distress.  She was given [facility] for possible secondary bronchitis.  Patient was seen in the office earlier this week.  Her lab work has improved with return of her WBC count to normal.  Patient is slowly starting to feel better and back to her baseline.  Her Premarin has been placed on hold and with recommendations for an alternative nonhormonal based options. She is also been taken off of Cytoxan as she has been on this for 1 year.  Patient says she is actually been doing better over the last 1 to 2 days.  She is starting to get back to her baseline.  Still has quite a bit of fatigue but is starting to increase her activity level.  Unfortunately today she had a fall at home.  Today's virtual visit is for an acute office visit after she fell at home.  Patient says that she was going down her steps in her garage and missed the last step causing her to fall striking her arm knee and back of her head.  Patient has abrasions along her both knees and arms especially on the left.  She also has some bruising.  Patient also had a goose egg on the posterior scalp.  Patient says she did not lose consciousness.  She has had no dizziness, confusion, visual changes, speech changes, extremity weakness.  Patient says she is very sore.  Says the goose egg swelling in the posterior scalp has decreased.  She also had some bleeding along the abrasions on her knees that is also stopped.  She is not on a blood thinner.  Patient says she has taken Tylenol.  She did take an Aleve initially but has been advised not to take any additional nonsteroidals.  She says she is feeling better.   She denies any vomiting or diarrhea.  Says she does have some nausea but she has had this.  She says she has been drinking well.  Says she has not eaten yet.  We discussed multiple issues such as confusion visual, speech or extremity weakness as red flags that she will need to seek emergency care.       Observations/Objective:  Patient is alert and oriented, appropriate. Does not appear in distress.    Patient was able to move all extremities well on video visit she does have some pain in the left knee with flexion.  But she is able to bear weight and was able to walk to the bathroom prior to our visit.  She is able to adduct and abduct her arms.   Assessment and Plan: Mechanical fall at home after tripping on stairs.  Did not have loss of consciousness.  Does not appear to have any neurological deficits currently.  I have discussed in deftly with patient red flags to watch out for such as confusion visual or speech changes extremity weakness severe headache or worsening symptoms.  She is to seek emergency room care.  She is to contact our office if her symptoms persist or do not improve.  It appears she does not have any musculoskeletal issues other than some contusions.  However have advised her if she has difficulty walking or bearing weight she is to seek care with her primary care provider or orthopedist. She is to care for her skin abrasions and watch for signs and symptoms of infection. Have advised her and her family to monitor her every 2-3 hours for the next 24 hours to make sure she does not develop any neurological deficits or acute symptoms.  Progressive ILD with recent flare with bronchitis seems to be improved.  She is returning back to her baseline.  Recent lab work yesterday showed improved CBC with Zollner blood cell count back to normal.  Patient is to follow-up per Dr. Golden Pop recommendations this week  Plan  Patient Instructions  Call our office back if your symptoms do not  improve or worsen.  You are to contact or seek emergency room care if you develop severe headache, speech or vision changes or extremity weakness or develop neurological changes. Please monitor for the next 24 hours every 2-3 hours neurological status.  Clean abrasions with soap and water pat dry cover if needed watch for signs and symptoms of infection such as redness drainage or fever. If joint pain worsens or does not improve you will need to follow-up with your primary care provider or orthopedics. Mucinex DM twice daily as needed for cough and congestion Incentive spirometer several times a day Continue on oxygen to keep O2 saturations greater than 88 to 90% Hold Premarin .  Continue on Ofev Follow-up with Dr. [name] as planned and as needed Please contact office  for sooner follow up if symptoms do not improve or worsen or seek emergency care         Follow Up Instructions: Follow-up in 4 weeks and as needed Please contact office for sooner follow up if symptoms do not improve or worsen or seek emergency care     I discussed the assessment and treatment plan with the patient. The patient was provided an opportunity to ask questions and all were answered. The patient agreed with the plan and demonstrated an understanding of the instructions.   The patient was advised to call back or seek an in-person evaluation if the symptoms worsen or if the condition fails to improve as anticipated.  I provided 32 minutes of non-face-to-face time during this encounter.   Rexene Edison, NP

## 2019-02-10 LAB — PROCALCITONIN: Procalcitonin: 0.06 ng/mL (ref 0.00–0.08)

## 2019-02-12 NOTE — Telephone Encounter (Signed)
Dee w/ [facility] returned call Orders can be placed in Epic under admission orders, or can be faxed.  We will need to call the Patient [facility] [place] @ [phone] to schedule her first flush, but then after that patient will schedule next appts.  Physician order sheet received and held for TP so that we may work on the orders.

## 2019-02-12 NOTE — Telephone Encounter (Signed)
Called Short Stay and spoke with Tasha Ballard @ [phone] Cytoxan is done at [facility] @ [phone] and left message to have someone call me back.  Would like to know if they prefer an order in epic (and how we place said order) or fax order and to what fax number.

## 2019-02-13 ENCOUNTER — Encounter (HOSPITAL_COMMUNITY)
Admission: RE | Admit: 2019-02-13 | Discharge: 2019-02-13 | Disposition: A | Discharge status: Home / Self Care | Payer: BC Managed Care – PPO | Source: Ambulatory Visit | Attending: Internal Medicine | Admitting: Internal Medicine

## 2019-02-13 ENCOUNTER — Other Ambulatory Visit: Payer: Self-pay

## 2019-02-13 DIAGNOSIS — J84112 Idiopathic pulmonary fibrosis: Secondary | ICD-10-CM | POA: Diagnosis not present

## 2019-02-13 DIAGNOSIS — J849 Interstitial pulmonary disease, unspecified: Secondary | ICD-10-CM

## 2019-02-13 NOTE — Progress Notes (Signed)
Daily Session Note  Patient Details  Name: Tasha Ballard MRN: [id number] Date of Birth: 1970/11/20 Referring Provider:     Pulmonary Rehab Walk Test from 01/11/2019 in [place]  Referring Provider  Dr. [name]      Encounter Date: 02/13/2019  Check In: Session Check In - 02/13/19 1107      Check-In   Supervising physician immediately available to respond to emergencies  [facility] immediately available    Physician(s)  Dr. Nevada Crane    Location  [facility]    Staff Present  Rosebud Poles, RN, Bjorn Loser, MS, Exercise Physiologist;Lisa Ysidro Evert, RN    Virtual Visit  No    Medication changes reported      No    Fall or balance concerns reported     No    Tobacco Cessation  No Change    Warm-up and Cool-down  Performed on first and last piece of equipment    Resistance Training Performed  Yes    VAD Patient?  No    PAD/SET Patient?  No      Pain Assessment   Currently in Pain?  No/denies    Multiple Pain Sites  No       Capillary Blood Glucose: No results found for this or any previous visit (from the past 24 hour(s)).    Social History   Tobacco Use  Smoking Status Never Smoker  Smokeless Tobacco Never Used    Goals Met:  Independence with exercise equipment Exercise tolerated well Strength training completed today  Goals Unmet:  Not Applicable  Comments: Service time is from 1030 to 1133    Dr. Rush Farmer is Medical Director for Pulmonary Rehab at [facility].

## 2019-02-14 ENCOUNTER — Telehealth: Payer: Self-pay | Admitting: Adult Health

## 2019-02-14 NOTE — Telephone Encounter (Signed)
TP signed order and I faxed it to [phone], Patient Tasha Ballard.

## 2019-02-14 NOTE — Telephone Encounter (Signed)
I cancelled pt's cytoxan appt which was scheduled for 12/30. Attempted to call pt but unable to reach.left pt a detailed message that this was taken care of.nothing further needed.

## 2019-02-15 ENCOUNTER — Encounter (HOSPITAL_COMMUNITY)
Admission: RE | Admit: 2019-02-15 | Discharge: 2019-02-15 | Disposition: A | Discharge status: Home / Self Care | Payer: BC Managed Care – PPO | Source: Ambulatory Visit | Attending: Internal Medicine | Admitting: Internal Medicine

## 2019-02-15 ENCOUNTER — Other Ambulatory Visit: Payer: Self-pay

## 2019-02-15 DIAGNOSIS — J849 Interstitial pulmonary disease, unspecified: Secondary | ICD-10-CM

## 2019-02-15 DIAGNOSIS — J84112 Idiopathic pulmonary fibrosis: Secondary | ICD-10-CM | POA: Diagnosis not present

## 2019-02-15 NOTE — Progress Notes (Signed)
Daily Session Note  Patient Details  Name: Tasha Ballard MRN: [id number] Date of Birth: Nov 06, 1970 Referring Provider:     Pulmonary Rehab Walk Test from 01/11/2019 in [address]  Referring Provider  Dr. [name]      Encounter Date: 02/15/2019  Check In: Session Check In - 02/15/19 1028      Check-In   Supervising physician immediately available to respond to emergencies  [facility] immediately available    Physician(s)  Dr. Avon Gully    Location  [facility]    Staff Present  Rosebud Poles, RN, Bjorn Loser, MS, Exercise Physiologist;Lisa Ysidro Evert, RN    Virtual Visit  No    Medication changes reported      No    Fall or balance concerns reported     No    Tobacco Cessation  No Change    Warm-up and Cool-down  Performed on first and last piece of equipment    Resistance Training Performed  Yes    VAD Patient?  No    PAD/SET Patient?  No      Pain Assessment   Currently in Pain?  No/denies    Pain Score  0-No pain    Multiple Pain Sites  No       Capillary Blood Glucose: No results found for this or any previous visit (from the past 24 hour(s)).    Social History   Tobacco Use  Smoking Status Never Smoker  Smokeless Tobacco Never Used    Goals Met:  Independence with exercise equipment Exercise tolerated well Strength training completed today  Goals Unmet:  Not Applicable  Comments: Service time is from 1020 to 1115    Dr. Rush Farmer is Medical Director for Pulmonary Rehab at [facility].

## 2019-02-20 ENCOUNTER — Encounter (HOSPITAL_COMMUNITY)
Admission: RE | Admit: 2019-02-20 | Discharge: 2019-02-20 | Disposition: A | Discharge status: Home / Self Care | Payer: BC Managed Care – PPO | Source: Ambulatory Visit | Attending: Internal Medicine | Admitting: Internal Medicine

## 2019-02-20 ENCOUNTER — Encounter (HOSPITAL_COMMUNITY): Payer: BC Managed Care – PPO

## 2019-02-20 ENCOUNTER — Other Ambulatory Visit: Payer: Self-pay

## 2019-02-20 VITALS — Wt 223.1 lb

## 2019-02-20 DIAGNOSIS — J84112 Idiopathic pulmonary fibrosis: Secondary | ICD-10-CM | POA: Diagnosis not present

## 2019-02-20 DIAGNOSIS — J849 Interstitial pulmonary disease, unspecified: Secondary | ICD-10-CM

## 2019-02-20 NOTE — Progress Notes (Signed)
Daily Session Note  Patient Details  Name: Tasha Ballard MRN: [id number] Date of Birth: 25-Mar-1970 Referring Provider:     Pulmonary Rehab Walk Test from 01/11/2019 in [place]  Referring Provider  Dr. [name]      Encounter Date: 02/20/2019  Check In: Session Check In - 02/20/19 1121      Check-In   Supervising physician immediately available to respond to emergencies  [facility] immediately available    Physician(s)  Dr. Nevada Crane    Location  [facility]    Staff Present  Rosebud Poles, RN, Bjorn Loser, MS, Exercise Physiologist;Lisa Ysidro Evert, RN    Virtual Visit  No    Medication changes reported      No    Fall or balance concerns reported     No    Tobacco Cessation  No Change    Warm-up and Cool-down  Performed on first and last piece of equipment    Resistance Training Performed  Yes    VAD Patient?  No    PAD/SET Patient?  No      Pain Assessment   Currently in Pain?  No/denies    Pain Score  0-No pain    Multiple Pain Sites  No       Capillary Blood Glucose: No results found for this or any previous visit (from the past 24 hour(s)).  Exercise Prescription Changes - 02/20/19 1100      Response to Exercise   Blood Pressure (Admit)  126/70    Blood Pressure (Exercise)  140/80    Blood Pressure (Exit)  128/88    Heart Rate (Admit)  103 bpm    Heart Rate (Exercise)  129 bpm    Heart Rate (Exit)  102 bpm    Oxygen Saturation (Admit)  98 %    Oxygen Saturation (Exercise)  90 %    Oxygen Saturation (Exit)  98 %    Rating of Perceived Exertion (Exercise)  12    Perceived Dyspnea (Exercise)  3    Duration  Continue with 30 min of aerobic exercise without signs/symptoms of physical distress.    Intensity  THRR unchanged      Progression   Progression  Continue to progress workloads to maintain intensity without signs/symptoms of physical distress.      Resistance Training   Training Prescription   Yes    Weight  orange bands    Reps  10-15    Time  10 Minutes      Oxygen   Oxygen  Continuous    Liters  10      Treadmill   MPH  1.6    Grade  1    Minutes  15      NuStep   Level  2    SPM  80    Minutes  30       Social History   Tobacco Use  Smoking Status Never Smoker  Smokeless Tobacco Never Used    Goals Met:  Proper associated with RPD/PD & O2 Sat Exercise tolerated well Strength training completed today  Goals Unmet:  Not Applicable  Comments: Service time is from 1015 to 1110    Dr. Rush Farmer is Medical Director for Pulmonary Rehab at [facility].

## 2019-02-21 ENCOUNTER — Encounter (HOSPITAL_COMMUNITY): Payer: BC Managed Care – PPO

## 2019-02-22 ENCOUNTER — Encounter (HOSPITAL_COMMUNITY): Payer: BC Managed Care – PPO

## 2019-02-22 LAB — THIOPURINE METHYLTRANSFERASE (TPMT), RBC: Thiopurine Methyltransferase, RBC: 16 nmol/hr/mL RBC

## 2019-02-22 LAB — D-DIMER, QUANTITATIVE: D-Dimer, Quant: 0.56 mcg/mL FEU — ABNORMAL HIGH (ref <0.50)

## 2019-02-26 ENCOUNTER — Other Ambulatory Visit: Payer: Self-pay

## 2019-02-26 ENCOUNTER — Ambulatory Visit (HOSPITAL_COMMUNITY)
Admission: RE | Admit: 2019-02-26 | Discharge: 2019-02-26 | Disposition: A | Discharge status: Home / Self Care | Payer: BC Managed Care – PPO | Source: Ambulatory Visit | Attending: Internal Medicine | Admitting: Internal Medicine

## 2019-02-26 ENCOUNTER — Inpatient Hospital Stay (HOSPITAL_COMMUNITY)
Admission: RE | Admit: 2019-02-26 | Discharge: 2019-02-26 | Disposition: A | Discharge status: Home / Self Care | Payer: BC Managed Care – PPO | Source: Ambulatory Visit

## 2019-02-26 DIAGNOSIS — J84112 Idiopathic pulmonary fibrosis: Secondary | ICD-10-CM | POA: Insufficient documentation

## 2019-02-26 MED ORDER — HEPARIN SOD (PORK) LOCK FLUSH 100 UNIT/ML IV SOLN
500.0000 [IU] | INTRAVENOUS | Status: AC | PRN
  Administered 2019-02-26: 500 [IU]

## 2019-02-26 MED ORDER — SODIUM CHLORIDE 0.9% FLUSH
10.0000 mL | INTRAVENOUS | Status: AC | PRN
  Administered 2019-02-26: 14:00:00 10 mL

## 2019-02-26 NOTE — Progress Notes (Signed)
Nutrition Note: Virtual Visit  Week 1 Spoke with pt for virtual pulmonary rehab. Pt is exercising at home. She has exercised twice since moving to virtual rehab last week.  Pt is not reporting exercise in virtual app. Pt denies adverse symptoms with exercise.   Diet recall reviewed. Pt denies any problems with medications.  Nutrition goals reviewed. Goal to start using bp cuff and weighing daily. Pt has incorporated breakfast 5 mornings in a row. She has been eating eggs or oats. Goal for the week is to get up early enough to eat breakfast so she is hungry for lunch and able to eat 3 meals/day.  Reinforced education and goals with summary email.  Will continue to monitor pt with weekly phone calls for virtual cardiac rehab.   Michaele Offer, MS, RDN, LDN

## 2019-02-26 NOTE — Progress Notes (Signed)
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 PAC flushed with Heparin and 0.9% Sodium Chloride and de-accessed. Patient tolerated well. AVS given. Patient alert, oriented and ambulatory at discharge.

## 2019-02-26 NOTE — Discharge Instructions (Signed)
Port-a-cath flushed with Heparin and 0.9% Sodium Chloride

## 2019-02-27 ENCOUNTER — Encounter (HOSPITAL_COMMUNITY): Payer: BC Managed Care – PPO

## 2019-03-01 ENCOUNTER — Encounter (HOSPITAL_COMMUNITY): Payer: BC Managed Care – PPO

## 2019-03-06 ENCOUNTER — Inpatient Hospital Stay (HOSPITAL_COMMUNITY)
Admission: RE | Admit: 2019-03-06 | Discharge: 2019-03-06 | Disposition: A | Discharge status: Home / Self Care | Payer: BC Managed Care – PPO | Source: Ambulatory Visit

## 2019-03-06 ENCOUNTER — Other Ambulatory Visit: Payer: Self-pay

## 2019-03-06 ENCOUNTER — Encounter (HOSPITAL_COMMUNITY): Payer: BC Managed Care – PPO

## 2019-03-06 ENCOUNTER — Telehealth (HOSPITAL_COMMUNITY): Payer: Self-pay | Admitting: *Deleted

## 2019-03-06 NOTE — Progress Notes (Signed)
Nutrition Note: Virtual Visit  Week 2  Spoke with pt for virtual pulmonary rehab. She is trying to exercise at home - has had a busy week and unable to this past week. Planning to start today or tomorrow. She is interested in keeping a food log for accountability to stay on track with her diet.   Diet recall reviewed. Pt denies any problems with medications.  Nutrition goals reviewed. Activity: food log Reinforced education and goals with summary email.  Will continue to monitor pt with weekly phone calls for virtual cardiac rehab.   Michaele Offer, MS, RDN, LDN

## 2019-03-06 NOTE — Telephone Encounter (Signed)
Called patient via phone for virtual pulmonary rehab.  She has not logged any exercise, let her know we are available for questions or concerns and provided her with contact information of [phone].  Will send a chat message to her via the virtual app as well.  PLAN : Monitor progress of home exercise, call weekly to motivate patient to exercise at least 2 days per week.

## 2019-03-07 ENCOUNTER — Telehealth: Payer: Self-pay | Admitting: Internal Medicine

## 2019-03-07 ENCOUNTER — Other Ambulatory Visit: Payer: Self-pay | Admitting: Internal Medicine

## 2019-03-07 ENCOUNTER — Ambulatory Visit
Admission: RE | Admit: 2019-03-07 | Discharge: 2019-03-07 | Disposition: A | Discharge status: Home / Self Care | Payer: Self-pay | Source: Ambulatory Visit | Attending: Internal Medicine | Admitting: Internal Medicine

## 2019-03-07 DIAGNOSIS — J849 Interstitial pulmonary disease, unspecified: Secondary | ICD-10-CM

## 2019-03-07 NOTE — Telephone Encounter (Signed)
email sent to patient. Will discuss with her at visit

## 2019-03-08 ENCOUNTER — Telehealth: Payer: Self-pay | Admitting: Internal Medicine

## 2019-03-08 ENCOUNTER — Encounter (HOSPITAL_COMMUNITY): Payer: BC Managed Care – PPO

## 2019-03-08 ENCOUNTER — Other Ambulatory Visit (HOSPITAL_COMMUNITY): Payer: Self-pay

## 2019-03-08 DIAGNOSIS — J029 Acute pharyngitis, unspecified: Secondary | ICD-10-CM

## 2019-03-08 NOTE — Telephone Encounter (Signed)
Tasha Ballard just email me as below . Urgent attention needed  Plan  1. Do you know why her upcomign CT chest was cancelled?  2. She needs covid testing 03/08/2019 - and if positive she likely will qualiufy for antibody treatment due to her obesity + ILD + immune suppression.  3. Keep upcoming appt iwht me but we can cancel it if covid is positive  Thanks  xxxxxxxxxxxxxxxxxxxxxxxxxxxxxx    I sneezed a lot yesterday and started feeling like I had a cold. Sore throat started around 6:30 and head ache. [place] work up this morning achy and feels like crap! Praying it is not COVID but [name] has it and [name] works with her dad, who didn't think he was exposed but who knows. Thanks, [employer]    Dr. Brand Males, M.D., F.C.C.P,  Pulmonary and Critical Care Medicine Staff Physician, [facility] Director - Interstitial Lung Disease  Program  Pulmonary [place] at [place] [employer], [place], [address]  Pager: [phone], If no answer or between  15:00h - 7:00h: call [phone] Telephone: [phone]  9:52 AM 03/08/2019  xxxxxxxxxxxxxxxxxxxxxxxxxxxxxxxxxxxxxxxxxxxxxxxxxxxxxxx

## 2019-03-08 NOTE — Telephone Encounter (Signed)
Per pt's chart, the CT chest was cancelled this morning for "patient has been d/c from Cytoxan by office due to reaction per Dr. [name]." Please advise if you would like this rescheduled after pt's results come back. Called pt and informed her to be tested. Lab ordered and pt was informed to text/call to schedule appt for testing. Pt verbalized understanding, she will do this now.   MR please advise on the CT. Thanks.

## 2019-03-08 NOTE — Telephone Encounter (Signed)
Do not understand why ct chest should have been discontinued because of aa cytoxan reaction but at this point let us see what the covid-19 test shows and we can decide.

## 2019-03-09 ENCOUNTER — Other Ambulatory Visit: Payer: BC Managed Care – PPO

## 2019-03-09 ENCOUNTER — Ambulatory Visit: Payer: BC Managed Care – PPO | Attending: Internal Medicine

## 2019-03-09 DIAGNOSIS — Z20822 Contact with and (suspected) exposure to covid-19: Secondary | ICD-10-CM

## 2019-03-10 LAB — NOVEL CORONAVIRUS, NAA: SARS-CoV-2, NAA: NOT DETECTED

## 2019-03-13 ENCOUNTER — Encounter (HOSPITAL_COMMUNITY): Payer: BC Managed Care – PPO

## 2019-03-13 ENCOUNTER — Telehealth: Payer: Self-pay | Admitting: Internal Medicine

## 2019-03-13 ENCOUNTER — Telehealth (HOSPITAL_COMMUNITY): Payer: Self-pay | Admitting: *Deleted

## 2019-03-13 ENCOUNTER — Encounter: Payer: Self-pay | Admitting: Internal Medicine

## 2019-03-13 ENCOUNTER — Encounter (HOSPITAL_COMMUNITY)
Admission: RE | Admit: 2019-03-13 | Discharge: 2019-03-13 | Disposition: A | Discharge status: Home / Self Care | Payer: BC Managed Care – PPO | Source: Ambulatory Visit | Attending: Internal Medicine | Admitting: Internal Medicine

## 2019-03-13 ENCOUNTER — Telehealth (INDEPENDENT_AMBULATORY_CARE_PROVIDER_SITE_OTHER): Payer: BC Managed Care – PPO | Admitting: Internal Medicine

## 2019-03-13 DIAGNOSIS — J849 Interstitial pulmonary disease, unspecified: Secondary | ICD-10-CM

## 2019-03-13 DIAGNOSIS — Z79899 Other long term (current) drug therapy: Secondary | ICD-10-CM

## 2019-03-13 DIAGNOSIS — J8489 Other specified interstitial pulmonary diseases: Secondary | ICD-10-CM

## 2019-03-13 DIAGNOSIS — J9611 Chronic respiratory failure with hypoxia: Secondary | ICD-10-CM | POA: Diagnosis not present

## 2019-03-13 DIAGNOSIS — Z5181 Encounter for therapeutic drug level monitoring: Secondary | ICD-10-CM | POA: Diagnosis not present

## 2019-03-13 NOTE — Telephone Encounter (Addendum)
Calling [place] about this one.  Got it scheduled for  3/9 @ 8:30, check in by 8:15.

## 2019-03-13 NOTE — Telephone Encounter (Signed)
2nd week feb 2021 - I can see her. Video or virtual.   At my visit - will work on removing [employer]  MR

## 2019-03-13 NOTE — Telephone Encounter (Signed)
Was this a CT scheduled for research.  It doesn't look like a CT we schedule.

## 2019-03-13 NOTE — Telephone Encounter (Signed)
Called patient for a weekly follow up for virtual pulmonary rehab.  She cancelled a virtual appointment with department nutritionist earlier today due to being sick and having a negative COVID test.  LMTCB if she needs any assistance with the virtual app when she feels well enough to exercise.  Will continue with a weekly follow up next week.

## 2019-03-13 NOTE — Patient Instructions (Addendum)
NSIP (nonspecific interstitial pneumonia) (HCC) Chronic respiratory failure with hypoxia (HCC) Therapeutic drug monitoring High risk medication use    -You seem to be recovering from a non-COVID-19 respiratory viral infection. -This has potential to flare up your interstitial lung disease but you seem to have handled it well so far. -I am glad you are better -Last blood work mid December 2020 was essentially normal -The D-dimer was barely elevated so no need for a CT angiogram but I do not know why you have follow-up high-resolution CT chest was canceled  Plan  -Continue nintedanib -Okay to hold off Bactrim for the moment -I think we might have to start you on Imuran as add-on therapy to nintedanib  -I have written to Dr. Elisabeth Cara to make sure he is okay with this plan  -If we decide to start Imuran this will have to be in February 2021 after you have recovered from a viral infection and at this point in time we will also have to start you on Bactrim again -Okay for COVID-19 vaccine per protocol by the government -Continue oxygen therapy as before  Follow-up -Once I hear from Dr. Elisabeth Cara I will connect you with [name] Yopp our pharmacist for of Imuran education -February 2021 for 30-minute visit -possibly start on Imuran at this visit based on outcome of the above -Changed high-resolution CT chest dated to March 2021

## 2019-03-13 NOTE — Telephone Encounter (Signed)
Pt had a CT scheduled for 03/09/19 but the CT was cancelled by [facility] Setters with a comment stating that Patient has been d/c from Cytoxan by office due to reaction per Dr. [name]. Patient's CT should not have been cancelled due to pt being d/c'd from cytoxan as MR was still wanting pt to have the CT done.  Since the CT was cancelled, MR is wanting pt to now change date to March 2021. PCCs, please advise if we need a new order or if we can use the previous order that had been placed?

## 2019-03-13 NOTE — Telephone Encounter (Signed)
Dear [name]  Could you please set upa visit for Tasha Ballard sometime in the month of January 2021 or early February 2021 to go over Imuran and starting Imuran.  She had cytotoxicity after 1 year of Cytoxan.  We decided to stop Cytoxan anyway after 1 year there is increased for pulmonary toxicity with Cytoxan.  She is on nintedanib right now for progressive NSIP.  Even though her autoimmune antibodies are negative she has had progressive NSIP.  Previously she was on CellCept at [facility].  I discussed with the other ILD specialist at [facility] in [place] and he is in support of starting her on Imuran.  Patient is also interested in this.  Currently she is recovering from a viral infection therefore I recommend she start this 4 weeks after the onset of the viral infection which would be early to mid February 2021.  In the interim you could do education.  I believe her TPMT levels are normal but you could check on this.  I will see her in my clinic after her visit with you -you can do a visit with her through video or telephone  Thank you    SIGNATURE    Dr. Brand Males, M.D., F.C.C.P,  Pulmonary and Critical Care Medicine Staff Physician, [place] Director - Interstitial Lung Disease  Program  Pulmonary [place] at [place], [place], [address]  Pager: [phone], If no answer or between  15:00h - 7:00h: call [phone] Telephone: [phone]  9:37 AM 03/13/2019

## 2019-03-13 NOTE — Telephone Encounter (Signed)
Scheduled patient for tele-visit on 2/3 at 9 am to counsel on Imuran.  Mariella Saa, PharmD, [place], [place] Clinical Specialty Pharmacist [phone]  03/13/2019 10:54 AM

## 2019-03-13 NOTE — Progress Notes (Signed)
-Lung function November 08, 2017: FVC 0.9 L / 27%, DLCO 5.86/29%  - HRCT 11/08/17 Rt diaphragm elevation ? Eventration v paresis.  Esophagogram November 11, 2017 - > 11/11/17 Tasha Ballard - Normal  Dr. Threasa Alpha at [facility].She was started on  IOV 11/22/2017  Chief Complaint  Patient presents with  . Consult    consut due to pulmonary fibrosis. Pt has been followed by [facility] and last PFT was peformed 8/14 at [facility]. Pt does have complaints of SOB with exertion which she states has become better due to rehab. Pt also has a dry hacking cough with mild production of mucus. Denies any CP.    Is seen by presents to the ILD clinic.  This is her first visit with [name].  History is obtained from her and review of the [place] where she is to follow-up with Dr. Hortencia Pilar.  Interstitial lung disease clinic integrated interstitial lung disease questionnaire: Symptoms: Shortness of breath for years.  Gradually worse.  Currently level 5 shortness of breath walking up stairs or walking up a hill with oxygen.  Level for shortness of breath for sweeping or vacuuming and level 3 shortness of breath for shopping or picking up things and doing laundry.  Level 3 shortness of breath or standing up from a chair or taking a shower making the bed and level 1 dyspnea for brushing teeth.  She also has a cough for 6 years which is getting worse.  On and off that is phlegm.  Occasional wheezing present there is associated fatigue.  She tells me that she had undiagnosed and unrecognized ILD for years.  This was then picked up at [facility] and then she got referred to [facility].  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  At this time she was placed on CellCept.  Then in 2016 she got somewhat worse and was placed on 2 L nasal cannula continuously.  In 2017 she tells me that she had a aortic aneurysm and this was  repaired at [facility].  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  And then for the last year and a half or so she has been on significantly worsening hypoxemia.  In the spring 2019 she is requiring 8 L at rest and 15 L at exertion.  Apparently Dr. Lauris Chroman then recommended Rituxan infusions which she got to 60 days apart with the last one being approximately in April of May 2019.  After that she is somewhat improved requiring only 9-10 L with exertion.  She has been attending pulmonary rehabilitation which has been helping.  She is obese and unable to lose weight although she is lost some.  She believes a lot of her oxygen needs might be related to deviated nasal septum/septal perforation.  She subsequently followed up with Dr. Ruthann Cancer at [facility] in the summer 2019.  I reviewed the notes.  Palliative care was recommended because of progressive respiratory failure.  She has never seen the transplant clinic at [facility] but she recommends that because of obesity and diaphragmatic dysfunction that she would not be a good candidate for transplant .  She was then frustrated with experience because she wants to fight the disease.  She then drove to [facility] and saw Dr. Elisabeth Cara at [facility] 1 week ago.  She spent substantial number of  days there.  At this point in time she showed me an email from him that suggest he might have myositis-ILD but they would like to review her CT scans and pathology from [facility].  Apparently she had significant amount of blood work and other tests at [facility]  I do not have access to these records at this point.  She believes that she wants to undergo transplant.  She thinks that she would be able to have transplant in [place] despite a high BMI.  Currently in [place] she is somewhat stable attending pulmonary rehabilitation.  She is interested in pulmonary trials  Past  medical history  -Tested for sleep apnea at [facility] and was told to be negative.  But at [facility] in September 2019 they recommended a repeat sleep study -Denied all forms of connective tissue disease diagnosis and vasculitis.  Although at [facility] September 2019 when she was seen last week she was told she might have myositis-ILD but this is pending confirmation in the multidisciplinary case conference -She does report positive for diabetes and thyroid disease and mononucleosis in the past but denies any tuberculosis or kidney disease   Review of systems -Positive for fatigue for a few years, arthralgia for a few years but denies dysphagia.  Positive for dry eyes and mouth for few years.  No Raynaud.  No recurrent fever.  No weight loss.  No acid reflux but does admit to snoring for a few years.  No rash no ulcers.  Family history of lung disease COPD and a long but no pulmonary fibrosis.  Dad did have rheumatoid arthritis  Personal exposure history Denies tobacco, vaping, marijuana, cocaine, IV drugs  Home and hobby history  [place] which is 52 years old  Occupational history -She does stay in a condition spaces.  122 point occupational questionnaire completely negative  Medication history -She has a history of allergy to Macrobid/nitrofurantoin.  Do not know when she took it.  She is currently on immunosuppressive regimen of CellCept, Bactrim and prednisone.  CellCept since 2013 and prednisone since 2012.  She did get Rituxan x2 doses with the last one being in spring 2019.  She feels her hypoxemia improved somewhat after that.     has a past medical history of Aneurysm ([place]), Diabetes mellitus without complication ([place]), and Thyroid disease.   reports that she has never smoked. She has never used smokeless tobacco.   OV 12/21/2017  Subjective:  Patient ID: Tasha Ballard, female , DOB: 1971-02-13 , age 6 y.o. , MRN: [id number] ,  ADDRESS: [address] [place] [address]   12/21/2017 -   Chief Complaint  Patient presents with  . Follow-up    ILD     HPI Tasha Ballard 49 y.o. -returns for follow-up.  She brings her mother along who I am meeting for the first time.  This visit is specifically focused on evaluation from [place].  Yesterday I did speak to the interstitial lung disease specialist at [facility] Dr. Threasa Alpha.  He told me he was finally able to receive and review the surgical lung biopsy from [facility].  He and his pathologist agreed with the diagnosis of progressive NSIP.  His main recommendation was to switch patient to Cytoxan.  In addition also aggressively pursue weight loss even if it means for bariatric surgery approach so we can get ready for transplant.  He agreed the prognosis is very poor.  At this point in time patient compared to the last visit is stable.  She is using more than 10 L of oxygen up to 15 L of oxygen.  She is interested in Cytoxan.  Dr. Elisabeth Cara indicated to me that efficacy wise oral and IV are similar but he felt IV would be a little bit safer from a urologic standpoint.  Patient is interested in weight loss.  She says she has had difficulty losing weight despite being hypocaloric.  She is interested in nutritional referral.  In addition she wants a sleep specialist referral.  She says [place] [facility] was ruled out.  She has significant insomnia and daytime tiredness.  She said that [place] recommended sleep referral ASAP.  She is registered to participate in the ILD-pro registry study for progressive non-IPF disease.  This was done today.   Investigations at [facility] -November 09, 2017: Echocardiogram ejection fraction 70-75% with grade 2 diastolic dysfunction.  Right ventricle was mildly enlarged but global RV function was felt to be normal.  No pericardial effusion no obvious right-to-left shunt at rest or cough or Valsalva on agitated  saline contrast exam and stage I right ventricular diastolic dysfunction.  Pulmonary artery systolic pressure at 45 mmHg  -Lung function November 08, 2017: FVC 0.9 L / 27%, DLCO 5.86/29%  - HRCT 11/08/17 Rt diaphragm elevation ? Eventration v paresis. Sniff test recommendd  Esophagogram November 11, 2017 - > 11/11/17 Natil Jewsh - Normal    12/22/2017-telephone encounter - Dr. [name] >>>starting Cytoxan 50 mg/day >>>Avoid lots of fluids to avoid cystitis, avoid intercourse without contraception, return in 7 to 10 days to recheck CBC, Bmet, LFTs, UA  12/22/17-office visit-[place] >>>Plan in lab sleep study be ordered, patient will likely need BiPAP with oxygen  12/30/2017  - Visit   49 year old female patient presenting today for one-week follow-up visit after starting Cytoxan.  Patient also get lab work completed today.  Unfortunately patient did not get this completed prior to her office visit she will get afterwards.  Patient reports baseline symptoms of nonproductive cough.  Requiring 4 to 6 L of oxygen at rest as well as 10 to 15 L of oxygen with exertion.  Patient does have her baseline shortness of breath with exertion.  Patient reporting no worsening symptoms since starting Cytoxan.   Patient reports she typically is on 9 to 10 L of oxygen at home.   OV 01/17/2018  Subjective:  Patient ID: Tasha Ballard, female , DOB: 11-Mar-1970 , age 58 y.o. , MRN: [id number] , ADDRESS: [address] [place] [address]   01/17/2018 -   Chief Complaint  Patient presents with  . Follow-up    2 week f/u, lung transplant, was contacted and waiting on weight loss to continue   PRogressive NSIP Chronic hypoxemic resp failure Morbid Obesity - needs weight loss for transplant Rx regimen   -  cytoxan 9m per day 12/23/17 and increaed to 1057mon 12/23/17.On11/19/19. increase to 12558mer day  And switched to IV cytoxan Q4 week cycle - 1st cycle 02/03/18.   HPI Tasha Ballard 59.o. -presents for follow-up with her husband.  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 on cytoxan 64m2mr day 12/23/17 and increaed to 100mg51m11/1/19.  On 01/10/18.  increase to 125mg 9mday .  Plan is to get blood work today.  She wants a Port-A-Cath placed.  She prefers IV Cytoxan.  I checked with Dr. Threasa Alpha and he feels either p.o. or IV Cytoxan is fine but in the balance IV Cytoxan has a slightly lower incidence of hemorrhagic cystitis and the patient prefers this.  At this point in time she tells me that the Cytoxan seems to be working well for her.  She is less hypoxemic she is able to get by with 5 L at rest.  On the other hand this could be because of weight loss.  However there is some fatigue.  She will have blood work and urine analysis to look for cytotoxicity from Cytoxan.  Based on the newINBUILD  data we discussed about starting nintedanib.  I checked with Dr. Threasa Alpha at [facility] and he is supportive of the plan.  Patient states that she has done extensive research on nintedanib and she is very excited about the possibility of taking this.   Obesity: Weight 231#  11/22/17  -> 221# 01/17/2018.,  She saw the nutritionist at [facility].  She is using an app and tracking her weight on a regular basis.  She is encouraged by the weight loss.  We gave her a low glycemic diet sheet.  She has not been able to see the bariatric surgeon at [facility] surgery but I did run into Dr. Kaylyn Lim in the hallway and he said he would be happy to see her and counsel her.  Although he did acknowledge she would be high risk for surgery.  He is in favor of low carbohydrate diet.  A few weeks ago I met the transplant physician from [facility] and he brought up the idea about losing weight loss drugs.  His preferred drug is phentermine.  We discussed the  possibility about getting an endocrine consult  Lung transplant: [facility] has called her.  They want to BMI less than 30 or 36 I am not sure.  She is working towards his goal.  She has not heard from [facility].  I emailed them and the day after the visit patient is emailed me saying that she has heard from them.  She is very keen to get on the transplant list.    02/03/2018 OV for Direct Admit for IV Cytoxan  Patient presents to the office today for direct admission to [facility] for IV Cytoxan infusion.  She says her breathing is at baseline.  She remains on oxygen 4 to 6 L at rest and 10 to 15 L with activity.  He says her leg swelling is at baseline.  She remains on Lasix 40 mg daily and Aldactone 100 mg.  She is on chronic steroids with prednisone 10 mg daily. She has been on 05 x1 week.  Says she is tolerating with no nausea vomiting or diarrhea.  Does have some mild constipation.  Patient says she had a Port-A-Cath placed 1 week ago. Patient has chronic shortness of breath gets winded with minimal activity.  Has intermittent dry cough.   02/13/2018 Follow up : Progressive NSIP , O2 RF  Patient returns for a one-week follow-up.  Patient was recently admitted for IV Cytoxan under observation status.. Patient had been on oral Cytoxan.  Continue to have progressive worsening of her ILD.  At baseline she is on 4 to 6 L at rest.  And 10 to 15 L with activity.  She is on Aldactone and Lasix.  She has been started on  OFEV 3 weeks ago.  She is tolerating well with no nausea vomiting or diarrhea.  Does have some mild constipation. Since her Cytoxan infusion.  She says she has done well.  She had minimal nausea.  She does feel that her breathing has slightly improved.  Her oxygen demands are decreased.  She is using mainly only 6 L of oxygen.  And doing well with this with good O2 saturations. Says she was able to actually go out to dinner for the first time in a long  time.  She denies any hematuria, chest pain, orthopnea or increased leg swelling.  She is working on weight loss.  She is going to the weight loss center. Labs were done on 12/20 that showed normal [place] blood cell count, platelets and kidney function. Urinalysis was done today.  And is pending.. Plans are for a Cytoxan infusion in 3- 4 weeks from her last infusion.  OV 03/02/2018  Subjective:  Patient ID: Tasha Ballard, female , DOB: 14-Sep-1970 , age 49 y.o. , MRN: [id number] , ADDRESS: [address] [place] [address]   03/02/2018 -   Chief Complaint  Patient presents with  . Follow-up    Appt prior to pt's next Cytoxan infusion.  Pt states she is doing well since last visit. States she can tell that her breathing has improved. Has had some nausea/vomiting and wonders if it is from the [place].    HPI Tasha Ballard 49 y.o. -presents for the above issues.  I personally saw her before Thanksgiving 2019.  After that she is seen by nurse practitioner under my supervision as documented above for Cytoxan infusion.  She has had post Cytoxan infusion labs which showed her [place] count is decreasing but still adequate.  Most recent labs was February 13, 2018 at the weight loss clinic with a [place] count of 5900.  Rest of the labs reviewed and appear adequate and normal.  Including liver function test.  She is developed new onset of intermittent nausea and vomiting ever since starting nintedanib early December 2019.  This particularly happens with the Christmas meals.  She says this appears at random and is mild and is treated with Zofran.  Given the beneficial effects of nintedanib and progressive non-IPF ILD she is agreed to continue to take this.  She continues on her chronic prednisone and Bactrim prophylaxis.  She is finished 1 cycle of IV Cytoxan February 03, 2018.  She has upcoming second cycle 4 weeks apart on March 06, 2018.  At this point in time she is feeling well.  She feels her ILD has  actually improved.  On 2 L at rest she was saturating at 98%.  This is a huge improvement for her.  However when we walked her she still needed 8 L of oxygen with exertion.  She still says this is an improvement because in the past she was using more than 10 L for exertion.  Today my nurse practitioner will enter the orders for upcoming infusion.  There is no fever.  Morbid obesity: She has seen Dr. Leafy Ro at the weight loss clinic.  I reviewed the notes.  Dietary measures are indicated.  I have written to Dr. Leafy Ro asking about medications to accelerate weight loss given the fact her lung disease is poor prognosis and she is an urgent need for lung transplant and with weight loss being her main barrier.   Obesity: Weight 231#  11/22/17  -> 221# 01/17/2018., -> 222# on  03/02/2018    OV 03/30/2018  Subjective:  Patient ID: Tasha Ballard, female , DOB: 1970-10-06 , age 70 y.o. , MRN: [id number] , ADDRESS: [place] [address]   03/30/2018 -   Chief Complaint  Patient presents with  . Follow-up    PFT peformed today.  Pt states she has been doing okay since last visit.      HPI Tasha Ballard 49 y.o. -presents for follow-up of the above issues.  Her husband is here with her.  In terms of - Obesity: Weight 231#  11/22/17  -> 221# 01/17/2018., -> 222# on 03/02/2018 -> 220# on 03/30/2018  In terms of her progressive NSIP and chronic hypoxemic respiratory failure: She has finished 2 cycles of Cytoxan.  Second cycle was a full dose.  After that she got neutropenic as reviewed on the labs.  During this time she developed a facial rash.  This was on a weekend and over the phone we treated her with doxycycline.  It is largely improved.  Tomorrow she has an appointment with her dermatologist.  I do not know who it is.  Overall she is feeling stable in terms of her IPF.  Her pulmonary function test compared to [facility] shows some improvement.  In fact compared to [place] it is  even better.  She attributes this to both nintedanib and Cytoxan.  She is using 4 L of oxygen at rest and 8 L with exertion.  Her symptom score is documented below with shortness of breath at 21-22.  She always has a lot of fatigue.  In terms of her medicine tolerance particularly with nintedanib she had intermittent nausea and vomiting particularly after eating certain types of foods.  She is managing this with Zofran.  Symptom score is documented below.  Her most recent cycle of Cytoxan was March 06, 2018.  Her next Cytoxan is April 03, 2018.  I discussed with Dr. Julien Nordmann oncologist was a lot of experience with Cytoxan.  He said is okay to go with full dose if I felt so.  Patient herself is very keen on getting a full dose of Cytoxan.  She did not have neutropenic fever although she got neutropenic with the second cycle.     ROS  OV 04/26/2018  Subjective:  Patient ID: Tasha Ballard, female , DOB: 06-29-1970 , age 44 y.o. , MRN: [id number] , ADDRESS: [address] [place] [address]   04/26/2018 -   Chief Complaint  Patient presents with  . Follow-up    Pt states she currently is not feeling too well and believes she might have sinus infection. Pt has had complaints of fatigue, has head congestion, pain in left ear going down to her neck and also has had a sore throat.       HPI Tasha Ballard 49 y.o. - presents for above issues. Here with ? Mom.Since last visit had blood woirk last week and wbc nadir. Had associatd "usual fatigue of low wbc". But then father in law died and she was busy so never recovered from fatigue. Has new wheeze, sinus congestion and increased cruds. Feels she has sinusitis. Wants abx before cycle #4 cytoxan next week. She is worried about COVID-19 ans asked for advice. Otherwise well. Symptom score below      OV 10/20/2018  Subjective:  Patient ID: Tasha Ballard, female , DOB: 1971/01/21 , age 27 y.o. , MRN: [id number] , ADDRESS: [place]  [place] [address]     10/20/2018 -   Chief Complaint  Patient presents with  . ILD (interstitial lung disease) ([place])     HPI Tasha Ballard 49 y.o. -presents for follow-up of the above issues.  She reports that over summer 2020 her husband nearly got exposed to someone with COVID this was in the outdoor setting.  She never ended up getting COVID.  On October 22, 2018 she tested negative for COVID. On October 04, 2018 she had a virtual visit with nurse practitioner for sinus complaints.  She took a 10-day course of Augmentin.  She was taking this along with Bactrim despite instructions to hold Bactrim.  This gave her immense diarrhea.  She is also reporting that the diarrhea has now improved after finishing the Augmentin course.  However she is having fatigue for the last 1 month.  She feels this because of the oral Cytoxan.  She wants to switch to IV Cytoxan.  She is in need of a flu shot and is willing to have that.  Her Port-A-Cath site is fine.  Currently she is on prednisone, oral Cytoxan, Bactrim prophylaxis and nintedanib.  She maintains herself on 6 L.  Her symptom score and oxygen needs continue to be stable.  Obesity continues to be a barrier for transplant.  In terms of her ILD her last PFT was in February 2020 and CT scan was in  Nov 2019.      OV 01/03/2019  Subjective:  Patient ID: Tasha Ballard, female , DOB: October 09, 1970 , age 61 y.o. , MRN: [id number] , ADDRESS: [address] [place] [address]    01/03/2019 -   Chief Complaint  Patient presents with  . Follow-up    Pt states she has been doing good since last visit and states breathing has been doing well. Pt is doing 6L O2 sitting and 8-10L with exertion.     HPI Tasha Ballard 49 y.o. -presents for follow-up of the above medical problems.  Last visit was in end of August 2020.  At that point in time we decided to restart her IV Cytoxan because she did not want to do the oral Cytoxan in the midst of the pandemic because  this was making her fatigued and nauseated more.  She is now finished 2 cycles of IV Cytoxan both of the lower dose.  The first cycle was end of September 2020 but after that it looks like she developed some UTI symptoms although the culture did not grow anything.  We gave her antibiotics.  Therefore the second cycle was also at the lower dose.  She is willing to go up to the third cycle.  She feels her quality of life is much better with the IV Cytoxan cyclical regimen.  At this point in time she is finished 1 year of Cytoxan and also 1 year of nintedanib.  Overall she feels stable.  She uses oxygen with rest and exertion fluctuating between 6 and 8 L.  She feels a combination of the 2 has helped although she thinks maybe the nintedanib has helped her more.  She wants to have a repeat Pneumovax today.  She was asking about the Shingrix vaccine which is a live attenuated vaccine.  Her last blood draw was December 29, 2018 with [name] and this was reviewed and is normal although the potassium is 3.5 and magnesium is 1.8 which is slightly below target.  One of her questions was about how long to  continue the Cytoxan regimen.  I will reach out to [facility] for this.  In addition she feels she is hit menopause because she has been having a lot of night sweats.  She is been started on Premarin.  She is also lost some weight.  She continues prednisone at 10 mg/day.  Her symptom scores below shows stability.  Her lung function shows stability.  CT scan shows presence of fibrosis but this no mention about progression.     OV 02/08/2019  Subjective:  Patient ID: Tasha Ballard, female , DOB: Oct 07, 1970 , age 24 y.o. , MRN: [id number] , ADDRESS: [place] [address]  #Morbid Obesity - needs weight loss for transplant - sees Dr Janeal Holmes since dec 2019 #PRogressive NSIP #Chronic hypoxemic resp failure - 4L at rest, 8L with exertion in dec 2019 - dec 2020 6 to 8/12L # High risk treatment  regimen  -CellCept 2013 through 2019  -Chronic prednisone 2013 -currently taking -Bactrim prophylaxis 2013 --currently taking  - Cytoxan Oral  47m per day 12/23/17 and increaed to 103mon 12/23/17.On11/19/19. increase to 12560mer day   - Cytoxan IV : And switched to IV cytoxan Q4 week cycle   - 1st cycle 02/03/18 (s/p portocath 01/26/18)  = 2nd cycle 03/06/2018  - 3rd cycle  -  04/03/2018  - 4th cycle - 05/01/2018 - Cytoxan oral (due to pandemic covid-19 and need to avoid infusion visits) - spring 2020 - BAck on IV Cytoxan -   - 5th cycle - end sept 2020  - 6th cycle - end oct 2020  - 7th  Cycle - end nov 2020 (full dose) 01/17/2019   - Ofev 01/23/18 (approximnate)  - Lung transplant needed: Obesity Barrier :  Weight 231#  11/22/17  -> 221# 01/17/2018., -> 222# on 03/02/2018 -> 220# on 03/30/2018 -> 223# on 04/26/2018 -> 224# on 10/20/2018 -> 219# on 01/03/2019 -> 222#   02/08/2019 -   Chief Complaint  Patient presents with  . Follow-up    Pt has had worsening SOB and also has had increased fatigue and weakness. Pt was tested for covid and was negative.     HPI Tasha Ballard 21o. 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  I last saw her in mid November 2020.  After that on January 17, 2019 she underwent Cytoxan infusion.  The particular infusion was an escalation in dose.  She says immediately after that a few hours after that infusion she started having diarrhea.  That was extremely bad.  She called into our office on January 25, 2019 and spoke to the nurse practitioner.  She was asked to hold her nintedanib and Metformin.  She was also asked to hold her Bactrim.  She was given a short course of Diflucan.  After this the diarrhea improved.  Somewhere along the way she got started on Premarin for hot flashes.  It seems she was stabilizing after that but then she had a video visit with the nurse practitioner February 05 2019, 3 days ago.  During  this visit she reported that since December Leven 2020 she was desaturating much easier.  She finished a shower and started feeling she was going to blackout.  Just a few weeks ago she was able to do 14 minutes on a treadmill on 6 L before she desaturated but then on February 02, 2019 she was desaturating to 83% even on 12 L nasal cannula.  She was having sinus symptoms.  She was Covid tested and this was negative.  Nurse practitioner advised her to stop her Premarin.  She also gave her Omnicef for sinusitis.  She increased the prednisone to 20 mg for 5 days which patient is currently on.  After this the patient is supposed to go back on prednisone 10 mg/day.  She says she is now beginning to feel better.  The oxygen levels have stabilized.  But she is having extreme fatigue.  Her primary is on hold.  Symptom scores are listed below.  She is now with her husband to talk future direction in her care.  She is worried about her ability to handle Cytoxan.  She is also worried about her interstitial lung disease progressing.  She has a Port-A-Cath.   Results for Long, Tasha Ballard" (MRN [id number]) as of 02/08/2019 09:44  Ref. Range 01/26/2019 09:32 02/01/2019 09:44  WBC Latest Ref Range: 4.0 - 10.5 K/uL 1.7 Repeated and verified X2. (LL) 2.6 (L)          OV 03/13/2019 - video visiit  Subjective:  Patient ID: Tasha Ballard, female , DOB: 08-02-1970 , age 66 y.o. 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 -6 days ago felt like coming down with flu. 3d prior to that husband [name] the same. They both slept a  Lot. Then approx 3-4 days ago patient got tested for covid tested and is negative. 2 days ago felt better but then fatigued again. HAving cough, mucus but not color, sore throat early AM/night. Denies GERD. Appetite poor.  Currently not on cytoxan. Not on  bactrim. On ofev.  sTarting to feel better today. A week ago was more dyspneic. Now less dyspneic. Now also less fatigued.   At rest no 7-8L now 24/7 for rest and exertion. Using 40 feet hose with tank.  She is on wait for list for covid vaccine - she has registered. Says Kathlee Nations her niece -> got covid but was not exposed to her  Her most important question is that she is currently on nintedanib for her progressive ILD.  She is interested in other immunomodulator.  Back in November 2020 her ILD specialist at [facility] Dr. Elisabeth Cara indicated we should stop her Cytoxan.  Indeed with a December 2020 dose she developed cytotoxicity.  At this point in time she completed 1 year of Cytoxan.  She is very worried about her progressive ILD and wants another immunosuppression.  We considered Imuran.  In December 2020 I checked her TPMT levels and this was normal.  At this point in time I wrote to Dr. Elisabeth Cara again and he is in support of our decision to do Imuran.  He canceled this after this patient visit but on the same day.  Patient is currently having respiratory viral infection but is recovering from it.   SYMPTOM SCALE - ILD 03/30/2018  04/26/2018  10/20/2018  01/03/2019  02/08/2019   O2 use  4 L at rest, 8 L with exertion Same but prefers 6L at all times 6L  6L at rest, 8-10 L with exertion 6L rest -> 93%  Shortness of Breath 0 -> 5 scale with 5 being worst (score 6 If unable to do)      At rest 0 1 0 0 0  Simple tasks - showers, clothes change, eating, shaving  1 3.5 3.5 3.5 2  Household (dishes, doing bed, laundry) 2.5 4.5 3._0 Shopping 3 4.5 3._1 Walking level at own pace 3 3.5 3.5 3.5 4  Walking keeping up with others of same age _2 4.5 5  Walking up Stairs 4 5 4._3 Walking up Hill 4 5 4._4 Total (40 - 48) Dyspnea Score 21.5 32 28 29.5 29  How bad is your cough? x 2.5 x mild worse  How bad is your fatigue 5 ("very, lol" 4 Extreme 5+ "lot" awful   Results for Weckerly, Tasha Ballard" (MRN [id number]) as of 01/03/2019 10:52  Ref. Range 03/30/2018 11:50 11/20/2018 15:14  FVC-Pre Latest Units: L 1.09 1.16  FVC-%Pred-Pre Latest Units: % 32 35  Results for Fero, Tasha Y "[name]" (MRN [id number]) as of 01/03/2019 10:52  Ref. Range 03/30/2018 11:50 11/20/2018 15:14  DLCO unc Latest Units: ml/min/mmHg 6.85 7.29  DLCO unc % pred Latest Units: % 34 37     CT CHest High Resolution 11/20/2018  IMPRESSION: 1. The appearance of the lungs is considered probable usual interstitial pneumonia (UIP) per current ATS guidelines. Repeat high-resolution chest CT is recommended in 12 months to assess for temporal changes in the appearance of the lung parenchyma. 2. Aortic atherosclerosis.  Aortic Atherosclerosis (ICD10-I70.0).   Electronically Signed   By: Vinnie Langton M.D.   On: 11/20/2018 14:16  ROS - per HPI     has a past medical history of Aneurysm ([place]), Back pain, Diabetes mellitus without complication ([place]), H/O blood clots, Hypertension, Joint pain, Pulmonary fibrosis (HCC), SOB (shortness of breath), Swelling, Thyroid disease, and Vitamin D deficiency.   reports that she has never smoked. She has never used smokeless tobacco.  Past Surgical History:  Procedure Laterality Date  . ABLATION    . IR IMAGING GUIDED PORT INSERTION  01/26/2018  . KNEE SURGERY    . LUNG BIOPSY      Allergies  Allergen Reactions  . Macrobid [Nitrofurantoin Macrocrystal] Nausea And Vomiting  . Adhesive [Tape] Rash    Immunization History  Administered Date(s) Administered  . Influenza,inj,Quad PF,6+ Mos 12/24/2014, 02/05/2016, 01/07/2017, 11/22/2017, 10/20/2018  . Influenza-Unspecified 12/24/2014, 02/05/2016, 01/07/2017, 11/22/2017  . Pneumococcal Conjugate-13 07/20/2017  . Pneumococcal Polysaccharide-23 06/09/2012, 01/03/2019  . Tdap 02/19/2011    Family History  Problem Relation Age of Onset  . Heart failure Mother   . Heart disease Mother   . Rheumatologic  disease Father      Current Outpatient Medications:  .  benzonatate (TESSALON) 200 MG capsule, Take 1 capsule (200 mg total) by mouth 3 (three) times daily as needed for cough., Disp: 30 capsule, Rfl: 1 .  carvedilol (COREG) 6.25 MG tablet, Take 6.25 mg by mouth 2 (two) times daily with a meal., Disp: , Rfl:  .  cefdinir (OMNICEF) 300 MG capsule, Take 1 capsule (300 mg total) by mouth 2 (two) times daily., Disp: 14 capsule, Rfl: 0 .  clindamycin (CLEOCIN T) 1 % lotion, , Disp: , Rfl:  .  cyclophosphamide (CYTOXAN) 50 MG capsule, Take 2 capsules (100 mg total) by mouth daily. Give on an empty stomach 1 hour before or 2 hours after meals., Disp: 60 capsule, Rfl: 2 .  furosemide (LASIX) 40 MG tablet, Take 40 mg by mouth See admin instructions. Take 1 tablet (40 mg) daily, may repeat dose if needed for fluid retention., Disp: , Rfl:  .  gabapentin (NEURONTIN) 100  MG capsule, Take 1 capsule by mouth as needed., Disp: , Rfl:  .  magnesium oxide (MAG-OX) 400 MG tablet, Take 1 tablet (400 mg total) by mouth daily., Disp: 30 tablet, Rfl: 5 .  metFORMIN (GLUCOPHAGE) 500 MG tablet, Take 500 mg by mouth 2 (two) times daily. , Disp: , Rfl:  .  montelukast (SINGULAIR) 10 MG tablet, Take 10 mg by mouth daily after supper., Disp: , Rfl:  .  OFEV 150 MG CAPS, Take 150 mg by mouth 2 (two) times daily., Disp: , Rfl: 11 .  ondansetron (ZOFRAN) 8 MG tablet, TAKE 1 TABLET BY MOUTH EVERY 8 HOURS AS NEEDED FOR NAUSEA AND VOMITING, Disp: 45 tablet, Rfl: 2 .  potassium chloride (KLOR-CON) 10 MEQ tablet, TAKE 1 TABLET BY MOUTH 2 TIMES A DAY, Disp: 120 tablet, Rfl: 2 .  predniSONE (DELTASONE) 10 MG tablet, Take 10-91m as needed for SOB, Disp: 30 tablet, Rfl: 3 .  PREMARIN 0.625 MG tablet, Take 0.625 mg by mouth daily., Disp: , Rfl:  .  PROAIR HFA 108 (90 Base) MCG/ACT inhaler, Inhale 2 puffs into the lungs every 6 (six) hours as needed for wheezing or shortness of breath., Disp: , Rfl: 4 .  saccharomyces boulardii  (FLORASTOR) 250 MG capsule, Take 1 capsule (250 mg total) by mouth 2 (two) times daily., Disp: 60 capsule, Rfl: 0 .  spironolactone (ALDACTONE) 100 MG tablet, Take 100 mg by mouth daily., Disp: , Rfl:  .  sulfamethoxazole-trimethoprim (BACTRIM) 400-80 MG tablet, TAKE 1 TABLET BY MOUTH ONCE DAILY, Disp: 90 tablet, Rfl: 1 .  thyroid (ARMOUR) 90 MG tablet, Take 90 mg by mouth daily before breakfast. , Disp: , Rfl:  .  Vitamin D, Ergocalciferol, (DRISDOL) 1.25 MG (50000 UT) CAPS capsule, TAKE 1 CAPSULE BY MOUTH ONCE WEEKLY, Disp: 3 capsule, Rfl: 0      Objective:   There were no vitals filed for this visit.  Estimated body mass index is 40.81 kg/m as calculated from the following:   Height as of 02/08/19: _0  (1.575 m).   Weight as of 02/20/19: 223 lb 1.7 oz (101.2 kg).  _1 @  There were no vitals filed for this visit.   Physical Exam Video and phone visit - looked fine    Assessment:       ICD-10-CM   1. NSIP (nonspecific interstitial pneumonia) ([place]  J84.89   2. Chronic respiratory failure with hypoxia (HCC)  J96.11   3. Therapeutic drug monitoring  Z51.81   4. High risk medication use  Z79.899        Plan:     Patient Instructions  NSIP (nonspecific interstitial pneumonia) ([place] Chronic respiratory failure with hypoxia (HCC) Therapeutic drug monitoring High risk medication use    -You seem to be recovering from a non-COVID-19 respiratory viral infection. -This has potential to flare up your interstitial lung disease but you seem to have handled it well so far. -I am glad you are better -Last blood work mid December 2020 was essentially normal -The D-dimer was barely elevated so no need for a CT angiogram but I do not know why you have follow-up high-resolution CT chest was canceled  Plan  -Continue nintedanib -Okay to hold off Bactrim for the moment -I think we might have to start you on Imuran as add-on therapy to nintedanib  -I have written to Dr.  SElisabeth Carato make sure he is okay with this plan - later confirmed he is ok  -If we decide to start  Imuran this will have to be in February 2021 after you have recovered from a viral infection and at this point in time we will also have to start you on Bactrim again -Okay for COVID-19 vaccine per protocol by the government -Continue oxygen therapy as before  Follow-up - I will connect you with [name] Yopp our pharmacist for of Imuran education -February 2021 for 30-minute visit -possibly start on Imuran at this visit based on outcome of the above -Changed high-resolution CT chest dated to March 2021    (Level 04: Estb 30-39 min   visit type: video virtual visit visit spent in total care time and counseling or/and coordination of care by this undersigned MD - Dr Brand Males. This includes one or more of the following on this same day 03/13/2019: pre-charting, chart review, note writing, documentation discussion of test results, diagnostic or treatment recommendations, prognosis, risks and benefits of management options, instructions, education, compliance or risk-factor reduction. It excludes time spent by the Edie or office staff in the care of the patient . Actual time is 31.6 min)     SIGNATURE    Dr. Brand Males, M.D., F.C.C.P,  Pulmonary and Critical Care Medicine Staff Physician, [place] Director - Interstitial Lung Disease  Program  Pulmonary [place] at [place], [place], [address]  Pager: [phone], If no answer or between  15:00h - 7:00h: call [phone] Telephone: [phone]  9:35 AM 03/13/2019

## 2019-03-14 ENCOUNTER — Other Ambulatory Visit: Payer: Self-pay

## 2019-03-15 ENCOUNTER — Encounter (HOSPITAL_COMMUNITY): Payer: BC Managed Care – PPO

## 2019-03-19 NOTE — Telephone Encounter (Signed)
MR, you are not here the second week of February.

## 2019-03-26 ENCOUNTER — Encounter (HOSPITAL_COMMUNITY): Payer: BC Managed Care – PPO

## 2019-03-26 NOTE — Progress Notes (Signed)
Subjective: Patient connected via phone with the pharmacist for counseling on azathioprine (Imuran) for ILD.  She is followed by Dr. [name] in the ILD clinic and research. She was followed by Dr. Ruthann Cancer at [facility] in the past.  She is followed by Dr. Blanch Media at [facility]   She is interested in a lung transplant but is not eligible due to morbid obesity.  She is going to [facility] weight loss clinic.  Her current regimen includes Ofev 150 mg 1 capsule every 12 hours, prednisone 10 mg 1 to 2 tablets as needed for shortness of breath, and Bactrim 1 tablet daily.  Previous therapy includes: Cytoxan (oral and IV stopped due to toxicity), Cellcept (2013 to 2019), Rituxan (2 doses in 2019).     Her respiratory infection has resolved.   Objective: CMP     Component Value Date/Time   NA 134 (L) 02/08/2019 1029   NA 136 02/13/2018 1118   K 4.0 02/08/2019 1029   CL 94 (L) 02/08/2019 1029   CO2 34 (H) 02/08/2019 1029   GLUCOSE 105 (H) 02/08/2019 1029   BUN 14 02/08/2019 1029   BUN 11 02/13/2018 1118   CREATININE 0.81 02/08/2019 1029   CALCIUM 9.5 02/08/2019 1029   PROT 6.4 02/08/2019 1029   PROT 6.0 02/13/2018 1118   ALBUMIN 3.9 02/08/2019 1029   ALBUMIN 4.2 02/13/2018 1118   AST 20 02/08/2019 1029   ALT 31 02/08/2019 1029   ALKPHOS 62 02/08/2019 1029   BILITOT 0.3 02/08/2019 1029   BILITOT 0.4 02/13/2018 1118   GFRNONAA >60 01/17/2019 0805   GFRAA >60 01/17/2019 0805    CBC    Component Value Date/Time   WBC 10.1 02/08/2019 1029   RBC 4.18 02/08/2019 1029   HGB 12.7 02/08/2019 1029   HGB 11.1 02/13/2018 1118   HCT 38.9 02/08/2019 1029   HCT 37.2 02/13/2018 1118   PLT 155.0 02/08/2019 1029   MCV 93.0 02/08/2019 1029   MCV 81 02/13/2018 1118   MCH 31.0 01/17/2019 0805   MCHC 32.7 02/08/2019 1029   RDW 14.2 02/08/2019 1029   RDW 15.6 (H) 02/13/2018 1118   LYMPHSABS 0.8 02/08/2019 1029   LYMPHSABS 0.8 02/13/2018 1118   MONOABS 1.0 02/08/2019  1029   EOSABS 0.1 02/08/2019 1029   EOSABS 0.2 02/13/2018 1118   BASOSABS 0.1 02/08/2019 1029   BASOSABS 0.1 02/13/2018 1118    Baseline Immunosuppressant Therapy Labs TB GOLD Pending labs in 2 weeks  Hepatitis Panel Pending labs in 2 weeks  HIV Lab Results  Component Value Date   HIV Non Reactive 02/03/2018   TPMT Lab Results  Component Value Date   TPMT 16 02/08/2019     Assessment/Plan: Patient was counseled on the purpose, proper use, and adverse effects of azathioprine including risk of infection, nausea, rash, and hair loss.  Reviewed risk of cancer after long term use.  Discussed risk of myelosupression and reviewed importance of frequent lab work to monitor blood counts.  Patient dose will be Imuran 50 mg daily for 2 weeks then increase to 100 mg daily as tolerated.  She will also have to resume Bactrim for PCP prophylaxis due to combination of immunosuppressant agent and long-term prednisone use.  She will resume prior dose of Bactrim 400-80 mg 1 tablet daily.  Patient needs a follow up appointment in 2-4 weeks with Dr. [name].  She will need CBC/CMP/TB Gold/ Hepatitis panel drawn at that visit.  Future orders placed.  Patient also requested a refill of vitamin D.  Our office has not prescribed vitamin D.  Her last vitamin D level was 20.9 in December 2019.  We will follow-up with Dr. [name] about refilling vitamin D.  All questions encouraged and answered.  Instructed patient to call with any other questions or concerns.  Thank you for allowing pharmacy to be involved in  this patient's care.  This appointment required  25 minutes of patient care (this includes precharting, chart review, review of results, face-to-face care, etc.).   Mariella Saa, PharmD, [place], [place] Clinical Specialty Pharmacist [phone]  03/28/2019 9:11 AM

## 2019-03-27 ENCOUNTER — Telehealth (HOSPITAL_COMMUNITY): Payer: Self-pay | Admitting: *Deleted

## 2019-03-27 ENCOUNTER — Ambulatory Visit (HOSPITAL_COMMUNITY)
Admission: RE | Admit: 2019-03-27 | Discharge: 2019-03-27 | Disposition: A | Discharge status: Home / Self Care | Payer: BC Managed Care – PPO | Source: Ambulatory Visit | Attending: Internal Medicine | Admitting: Internal Medicine

## 2019-03-27 ENCOUNTER — Other Ambulatory Visit: Payer: Self-pay

## 2019-03-27 ENCOUNTER — Encounter (HOSPITAL_COMMUNITY): Payer: BC Managed Care – PPO

## 2019-03-27 DIAGNOSIS — J84112 Idiopathic pulmonary fibrosis: Secondary | ICD-10-CM | POA: Insufficient documentation

## 2019-03-27 MED ORDER — SODIUM CHLORIDE 0.9% FLUSH
10.0000 mL | INTRAVENOUS | Status: AC | PRN
  Administered 2019-03-27: 10 mL

## 2019-03-27 MED ORDER — HEPARIN SOD (PORK) LOCK FLUSH 100 UNIT/ML IV SOLN
500.0000 [IU] | INTRAVENOUS | Status: AC | PRN
  Administered 2019-03-27: 500 [IU]
  Filled 2019-03-27: qty 5

## 2019-03-27 NOTE — Telephone Encounter (Signed)
LMTCB re: follow up cal for virtual pulmonary rehab.

## 2019-03-27 NOTE — Progress Notes (Signed)
PATIENT CARE CENTER NOTE  Diagnosis: IPF ([id number])   Provider: Rexene Edison, NP   Procedure: Port-a-cath flush   Note: Patient's PAC accessed using sterile technique. PAC flushed with Heparin and 0.9% Sodium Chloride and de-accessed. Patient alert, oriented and ambulatory at discharge.

## 2019-03-28 ENCOUNTER — Telehealth (INDEPENDENT_AMBULATORY_CARE_PROVIDER_SITE_OTHER): Payer: BC Managed Care – PPO | Admitting: Pharmacist

## 2019-03-28 DIAGNOSIS — J849 Interstitial pulmonary disease, unspecified: Secondary | ICD-10-CM | POA: Diagnosis not present

## 2019-03-28 DIAGNOSIS — Z79899 Other long term (current) drug therapy: Secondary | ICD-10-CM

## 2019-03-28 MED ORDER — AZATHIOPRINE 50 MG PO TABS: ORAL_TABLET | ORAL | 0 refills | Status: DC

## 2019-03-28 MED ORDER — SULFAMETHOXAZOLE-TRIMETHOPRIM 400-80 MG PO TABS: ORAL_TABLET | ORAL | 1 refills | Status: DC

## 2019-03-29 NOTE — Telephone Encounter (Signed)
Noted that Tasha Ballard has started immurant  Ok to see Tamy 04/12/2019  Vit D to be addressed by TP during visit or Tasha Ballard can send it  Reply  Not needed - I am closing note and please no need to send a note for starting Vit D

## 2019-04-05 ENCOUNTER — Telehealth (HOSPITAL_COMMUNITY): Payer: Self-pay | Admitting: Internal Medicine

## 2019-04-06 ENCOUNTER — Ambulatory Visit: Payer: BC Managed Care – PPO | Attending: Internal Medicine

## 2019-04-06 DIAGNOSIS — Z23 Encounter for immunization: Secondary | ICD-10-CM | POA: Insufficient documentation

## 2019-04-06 NOTE — Progress Notes (Signed)
   Covid-19 Vaccination Clinic  Name:  COZY VEALE    MRN: [id number] DOB: [birth year]  04/06/2019  Ms. Gomm was observed post Covid-19 immunization for 15 minutes without incidence. She was provided with Vaccine Information Sheet and instruction to access the V-Safe system.   Ms. Hattabaugh was instructed to call 911 with any severe reactions post vaccine: Marland Kitchen Difficulty breathing  . Swelling of your face and throat  . A fast heartbeat  . A bad rash all over your body  . Dizziness and weakness    Immunizations Administered    Name Date Dose VIS Date Route   Pfizer COVID-19 Vaccine 04/06/2019  4:42 PM 0.3 mL 02/02/2019 Intramuscular   Manufacturer: [place]   Lot: [id number]   [place]: [id number]

## 2019-04-09 ENCOUNTER — Telehealth (HOSPITAL_COMMUNITY): Payer: Self-pay | Admitting: *Deleted

## 2019-04-09 NOTE — Telephone Encounter (Signed)
Called patient to see if she is interested in resuming in person exercise sessions.  She would like to participate, she will begin 04/17/2019 in the 1045 class.

## 2019-04-10 NOTE — Progress Notes (Addendum)
Pulmonary Individual Treatment Plan  Patient Details  Name: Tasha Ballard MRN: [id number] Date of Birth: 08-Jun-1970 Referring Provider:     Pulmonary Rehab Walk Test from 01/11/2019 in [place]  Referring Provider  Dr. [name]      Initial Encounter Date:    Pulmonary Rehab Walk Test from 01/11/2019 in [facility] [place]  Date  01/11/19      Visit Diagnosis: Interstitial lung disease ([place])  Patient's Home Medications on Admission:   Current Outpatient Medications:  .  azaTHIOprine (IMURAN) 50 MG tablet, Take 1 tablet (50 mg) by mouth daily for 2 weeks then increase to 2 tablets (170m) daily., Disp: 180 tablet, Rfl: 0 .  benzonatate (TESSALON) 200 MG capsule, Take 1 capsule (200 mg total) by mouth 3 (three) times daily as needed for cough., Disp: 30 capsule, Rfl: 1 .  carvedilol (COREG) 6.25 MG tablet, Take 6.25 mg by mouth 2 (two) times daily with a meal., Disp: , Rfl:  .  clindamycin (CLEOCIN T) 1 % lotion, , Disp: , Rfl:  .  furosemide (LASIX) 40 MG tablet, Take 40 mg by mouth See admin instructions. Take 1 tablet (40 mg) daily, may repeat dose if needed for fluid retention., Disp: , Rfl:  .  gabapentin (NEURONTIN) 100 MG capsule, Take 1 capsule by mouth as needed., Disp: , Rfl:  .  magnesium oxide (MAG-OX) 400 MG tablet, Take 1 tablet (400 mg total) by mouth daily., Disp: 30 tablet, Rfl: 5 .  metFORMIN (GLUCOPHAGE) 500 MG tablet, Take 500 mg by mouth 2 (two) times daily. , Disp: , Rfl:  .  montelukast (SINGULAIR) 10 MG tablet, Take 10 mg by mouth daily after supper., Disp: , Rfl:  .  OFEV 150 MG CAPS, Take 150 mg by mouth 2 (two) times daily., Disp: , Rfl: 11 .  ondansetron (ZOFRAN) 8 MG tablet, TAKE 1 TABLET BY MOUTH EVERY 8 HOURS AS NEEDED FOR NAUSEA AND VOMITING, Disp: 45 tablet, Rfl: 2 .  potassium chloride (KLOR-CON) 10 MEQ tablet, TAKE 1 TABLET BY MOUTH 2 TIMES A DAY, Disp: 120 tablet, Rfl: 2 .  predniSONE  (DELTASONE) 10 MG tablet, Take 10-247mas needed for SOB, Disp: 30 tablet, Rfl: 3 .  PROAIR HFA 108 (90 Base) MCG/ACT inhaler, Inhale 2 puffs into the lungs every 6 (six) hours as needed for wheezing or shortness of breath., Disp: , Rfl: 4 .  saccharomyces boulardii (FLORASTOR) 250 MG capsule, Take 1 capsule (250 mg total) by mouth 2 (two) times daily., Disp: 60 capsule, Rfl: 0 .  spironolactone (ALDACTONE) 100 MG tablet, Take 100 mg by mouth daily., Disp: , Rfl:  .  sulfamethoxazole-trimethoprim (BACTRIM) 400-80 MG tablet, TAKE 1 TABLET BY MOUTH ONCE DAILY, Disp: 90 tablet, Rfl: 1 .  thyroid (ARMOUR) 90 MG tablet, Take 90 mg by mouth daily before breakfast. , Disp: , Rfl:  .  Vitamin D, Ergocalciferol, (DRISDOL) 1.25 MG (50000 UT) CAPS capsule, TAKE 1 CAPSULE BY MOUTH ONCE WEEKLY, Disp: 3 capsule, Rfl: 0  Past Medical History: Past Medical History:  Diagnosis Date  . Aneurysm ([place]  . Back pain   . Diabetes mellitus without complication ([place]  . H/O blood clots   . Hypertension   . Joint pain   . Pulmonary fibrosis ([place]  . SOB (shortness of breath)   . Swelling   . Thyroid disease   . Vitamin D deficiency     Tobacco Use: Social  History   Tobacco Use  Smoking Status Never Smoker  Smokeless Tobacco Never Used    Labs: Recent Review Flowsheet Data    Labs for ITP Cardiac and Pulmonary Rehab Latest Ref Rng & Units 02/13/2018   Cholestrol 100 - 199 mg/dL 295(H)   LDLCALC 0 - 99 mg/dL 186(H)   HDL >39 mg/dL 85   Trlycerides 0 - 149 mg/dL 120   Hemoglobin A1c 4.8 - 5.6 % 5.6      Capillary Blood Glucose: Lab Results  Component Value Date   GLUCAP 115 (H) 01/26/2018   GLUCAP 162 (H) 09/01/2017   POCT Glucose    Row Name 01/23/19 1139 02/06/19 1138           POCT Blood Glucose   Pre-Exercise  111 mg/dL  125 mg/dL      Post-Exercise  123 mg/dL  111 mg/dL         Pulmonary Assessment Scores: Pulmonary Assessment Scores    Row Name 01/11/19 1555          ADL UCSD   ADL Phase  Entry     SOB Score total  64       CAT Score   CAT Score  25       UCSD: Self-administered rating of dyspnea associated with activities of daily living (ADLs) 6-point scale (0 = "not at all" to 5 = "maximal or unable to do because of breathlessness")  Scoring Scores range from 0 to 120.  Minimally important difference is 5 units  CAT: CAT can identify the health impairment of COPD patients and is better correlated with disease progression.  CAT has a scoring range of zero to 40. The CAT score is classified into four groups of low (less than 10), medium (10 - 20), high (21-30) and very high (31-40) based on the impact level of disease on health status. A CAT score over 10 suggests significant symptoms.  A worsening CAT score could be explained by an exacerbation, poor medication adherence, poor inhaler technique, or progression of COPD or comorbid conditions.  CAT MCID is 2 points  mMRC: mMRC (Modified Medical Research Council) Dyspnea Scale is used to assess the degree of baseline functional disability in patients of respiratory disease due to dyspnea. No minimal important difference is established. A decrease in score of 1 point or greater is considered a positive change.   Pulmonary Function Assessment: Pulmonary Function Assessment - 01/11/19 1504      Breath   Bilateral Breath Sounds  Clear    Shortness of Breath  Yes;Fear of Shortness of Breath;Limiting activity       Exercise Target Goals: Exercise Program Goal: Individual exercise prescription set using results from initial 6 min walk test and THRR while considering  patient's activity barriers and safety.   Exercise Prescription Goal: Initial exercise prescription builds to 30-45 minutes a day of aerobic activity, 2-3 days per week.  Home exercise guidelines will be given to patient during program as part of exercise prescription that the participant will acknowledge.  Activity Barriers & Risk  Stratification: Activity Barriers & Cardiac Risk Stratification - 01/11/19 1501      Activity Barriers & Cardiac Risk Stratification   Activity Barriers  Deconditioning;Muscular Weakness;Shortness of Breath       6 Minute Walk: 6 Minute Walk    Row Name 01/11/19 1620         6 Minute Walk   Phase  Initial     Distance  149  feet     Walk Time  5.58 minutes     # of Rest Breaks  1 standing rest 25 sec due to desaturation     MPH  1.73     METS  3.12     RPE  13     Perceived Dyspnea   3     VO2 Peak  10.92     Symptoms  No     Resting HR  99 bpm     Resting BP  112/74     Resting Oxygen Saturation   99 %     Exercise Oxygen Saturation  during 6 min walk  84 %     Max Ex. HR  159 bpm     Max Ex. BP  154/72     2 Minute Post BP  126/80       Interval HR   1 Minute HR  159     2 Minute HR  134     3 Minute HR  135     4 Minute HR  123     5 Minute HR  125     6 Minute HR  131     2 Minute Post HR  111     Interval Heart Rate?  Yes       Interval Oxygen   Interval Oxygen?  Yes     Baseline Oxygen Saturation %  99 %     1 Minute Oxygen Saturation %  92 %     1 Minute Liters of Oxygen  6 L     2 Minute Oxygen Saturation %  87 %     2 Minute Liters of Oxygen  6 L     3 Minute Oxygen Saturation %  84 %     3 Minute Liters of Oxygen  6 L     4 Minute Oxygen Saturation %  91 %     4 Minute Liters of Oxygen  8 L     5 Minute Oxygen Saturation %  88 %     5 Minute Liters of Oxygen  8 L     6 Minute Oxygen Saturation %  86 %     6 Minute Liters of Oxygen  8 L     2 Minute Post Oxygen Saturation %  96 %     2 Minute Post Liters of Oxygen  6 L        Oxygen Initial Assessment: Oxygen Initial Assessment - 01/11/19 1619      Home Oxygen   Home Oxygen Device  E-Tanks;Home Concentrator    Sleep Oxygen Prescription  Continuous    Liters per minute  7    Home Exercise Oxygen Prescription  Continuous    Liters per minute  10    Home at Rest Exercise Oxygen Prescription   Continuous    Liters per minute  2    Compliance with Home Oxygen Use  Yes      Initial 6 min Walk   Oxygen Used  Continuous;E-Tanks    Liters per minute  8      Program Oxygen Prescription   Program Oxygen Prescription  Continuous;E-Tanks    Liters per minute  10      Intervention   Short Term Goals  To learn and exhibit compliance with exercise, home and travel O2 prescription;To learn and understand importance of monitoring SPO2 with pulse oximeter and demonstrate accurate use of the pulse oximeter.;To  learn and understand importance of maintaining oxygen saturations>88%;To learn and demonstrate proper pursed lip breathing techniques or other breathing techniques.;To learn and demonstrate proper use of respiratory medications    Long  Term Goals  Exhibits compliance with exercise, home and travel O2 prescription;Verbalizes importance of monitoring SPO2 with pulse oximeter and return demonstration;Maintenance of O2 saturations>88%;Exhibits proper breathing techniques, such as pursed lip breathing or other method taught during program session;Compliance with respiratory medication;Demonstrates proper use of MDI's       Oxygen Re-Evaluation: Oxygen Re-Evaluation    Row Name 02/05/19 1121 04/10/19 0904           Program Oxygen Prescription   Program Oxygen Prescription  Continuous;E-Tanks  Continuous;E-Tanks      Liters per minute  10  10      Comments  10 L on TM and 8 L on stepper  10 L on TM and 8 L on stepper        Home Oxygen   Home Oxygen Device  E-Tanks;Home Concentrator  E-Tanks;Home Concentrator      Sleep Oxygen Prescription  Continuous  Continuous      Liters per minute  7  7      Home Exercise Oxygen Prescription  Continuous  Continuous      Liters per minute  10  10      Home at Rest Exercise Oxygen Prescription  Continuous  Continuous      Liters per minute  2  2      Compliance with Home Oxygen Use  Yes  Yes        Goals/Expected Outcomes   Short Term Goals   To learn and exhibit compliance with exercise, home and travel O2 prescription;To learn and understand importance of monitoring SPO2 with pulse oximeter and demonstrate accurate use of the pulse oximeter.;To learn and understand importance of maintaining oxygen saturations>88%;To learn and demonstrate proper pursed lip breathing techniques or other breathing techniques.;To learn and demonstrate proper use of respiratory medications  To learn and exhibit compliance with exercise, home and travel O2 prescription;To learn and understand importance of monitoring SPO2 with pulse oximeter and demonstrate accurate use of the pulse oximeter.;To learn and understand importance of maintaining oxygen saturations>88%;To learn and demonstrate proper pursed lip breathing techniques or other breathing techniques.;To learn and demonstrate proper use of respiratory medications      Long  Term Goals  Exhibits compliance with exercise, home and travel O2 prescription;Verbalizes importance of monitoring SPO2 with pulse oximeter and return demonstration;Maintenance of O2 saturations>88%;Exhibits proper breathing techniques, such as pursed lip breathing or other method taught during program session;Compliance with respiratory medication;Demonstrates proper use of MDI's  Exhibits compliance with exercise, home and travel O2 prescription;Verbalizes importance of monitoring SPO2 with pulse oximeter and return demonstration;Maintenance of O2 saturations>88%;Exhibits proper breathing techniques, such as pursed lip breathing or other method taught during program session;Compliance with respiratory medication;Demonstrates proper use of MDI's      Goals/Expected Outcomes  Compliance  Compliance         Oxygen Discharge (Final Oxygen Re-Evaluation): Oxygen Re-Evaluation - 04/10/19 0904      Program Oxygen Prescription   Program Oxygen Prescription  Continuous;E-Tanks    Liters per minute  10    Comments  10 L on TM and 8 L on  stepper      Home Oxygen   Home Oxygen Device  E-Tanks;Home Concentrator    Sleep Oxygen Prescription  Continuous    Liters per minute  7  Home Exercise Oxygen Prescription  Continuous    Liters per minute  10    Home at Rest Exercise Oxygen Prescription  Continuous    Liters per minute  2    Compliance with Home Oxygen Use  Yes      Goals/Expected Outcomes   Short Term Goals  To learn and exhibit compliance with exercise, home and travel O2 prescription;To learn and understand importance of monitoring SPO2 with pulse oximeter and demonstrate accurate use of the pulse oximeter.;To learn and understand importance of maintaining oxygen saturations>88%;To learn and demonstrate proper pursed lip breathing techniques or other breathing techniques.;To learn and demonstrate proper use of respiratory medications    Long  Term Goals  Exhibits compliance with exercise, home and travel O2 prescription;Verbalizes importance of monitoring SPO2 with pulse oximeter and return demonstration;Maintenance of O2 saturations>88%;Exhibits proper breathing techniques, such as pursed lip breathing or other method taught during program session;Compliance with respiratory medication;Demonstrates proper use of MDI's    Goals/Expected Outcomes  Compliance       Initial Exercise Prescription: Initial Exercise Prescription - 01/11/19 1600      Date of Initial Exercise RX and Referring Provider   Date  01/11/19    Referring Provider  Dr. [name]      Oxygen   Oxygen  Continuous    Liters  8-10      Treadmill   MPH  1.6    Grade  1    Minutes  15      NuStep   Level  2    SPM  80    Minutes  15      Prescription Details   Frequency (times per week)  2    Duration  Progress to 30 minutes of continuous aerobic without signs/symptoms of physical distress      Intensity   THRR 40-80% of Max Heartrate  68-136    Ratings of Perceived Exertion  11-13    Perceived Dyspnea  0-4      Progression    Progression  Continue progressive overload as per policy without signs/symptoms or physical distress.      Resistance Training   Training Prescription  Yes    Weight  orange bands    Reps  10-15       Perform Capillary Blood Glucose checks as needed.  Exercise Prescription Changes: Exercise Prescription Changes    Row Name 01/16/19 1139 02/01/19 1135 02/20/19 1100         Response to Exercise   Blood Pressure (Admit)  128/80  104/70  126/70     Blood Pressure (Exercise)  140/88  100/64  140/80     Blood Pressure (Exit)  118/84  104/60  128/88     Heart Rate (Admit)  101 bpm  93 bpm  103 bpm     Heart Rate (Exercise)  128 bpm  118 bpm  129 bpm     Heart Rate (Exit)  100 bpm  100 bpm  102 bpm     Oxygen Saturation (Admit)  99 %  97 %  98 %     Oxygen Saturation (Exercise)  94 %  90 %  90 %     Oxygen Saturation (Exit)  98 %  97 %  98 %     Rating of Perceived Exertion (Exercise)  _0 Perceived Dyspnea (Exercise)  1._1 Duration  Continue with 30  min of aerobic exercise without signs/symptoms of physical distress.  Continue with 30 min of aerobic exercise without signs/symptoms of physical distress.  Continue with 30 min of aerobic exercise without signs/symptoms of physical distress.     Intensity  Other (comment) 40-80% HRR  THRR unchanged  THRR unchanged       Progression   Progression  Continue to progress workloads to maintain intensity without signs/symptoms of physical distress.  Continue to progress workloads to maintain intensity without signs/symptoms of physical distress.  Continue to progress workloads to maintain intensity without signs/symptoms of physical distress.       Resistance Training   Training Prescription  Yes  Yes  Yes     Weight  orange bands  orange bands  orange bands     Reps  10-15  10-15  10-15     Time  10 Minutes  10 Minutes  10 Minutes       Interval Training   Interval Training  No  --  --       Oxygen   Oxygen   Continuous  Continuous  Continuous     Liters  8-10 L  8-10   10       Treadmill   MPH  1.6  --  1.6     Grade  1  --  1     Minutes  15  --  15       NuStep   Level  _0 SPM  80  80  80     Minutes  _1 METs  1.6  --  --        Exercise Comments:   Exercise Goals and Review: Exercise Goals    Row Name 01/11/19 1633 02/05/19 1121 04/10/19 0905         Exercise Goals   Increase Physical Activity  Yes  Yes  Yes     Intervention  Provide advice, education, support and counseling about physical activity/exercise needs.;Develop an individualized exercise prescription for aerobic and resistive training based on initial evaluation findings, risk stratification, comorbidities and participant's personal goals.  Provide advice, education, support and counseling about physical activity/exercise needs.;Develop an individualized exercise prescription for aerobic and resistive training based on initial evaluation findings, risk stratification, comorbidities and participant's personal goals.  Provide advice, education, support and counseling about physical activity/exercise needs.;Develop an individualized exercise prescription for aerobic and resistive training based on initial evaluation findings, risk stratification, comorbidities and participant's personal goals.     Expected Outcomes  Short Term: Attend rehab on a regular basis to increase amount of physical activity.;Long Term: Add in home exercise to make exercise part of routine and to increase amount of physical activity.;Long Term: Exercising regularly at least 3-5 days a week.  Short Term: Attend rehab on a regular basis to increase amount of physical activity.;Long Term: Add in home exercise to make exercise part of routine and to increase amount of physical activity.;Long Term: Exercising regularly at least 3-5 days a week.  Short Term: Attend rehab on a regular basis to increase amount of physical activity.;Long Term:  Add in home exercise to make exercise part of routine and to increase amount of physical activity.;Long Term: Exercising regularly at least 3-5 days a week.     Increase Strength and Stamina  Yes  Yes  Yes     Intervention  Provide advice, education, support and  counseling about physical activity/exercise needs.;Develop an individualized exercise prescription for aerobic and resistive training based on initial evaluation findings, risk stratification, comorbidities and participant's personal goals.  Provide advice, education, support and counseling about physical activity/exercise needs.;Develop an individualized exercise prescription for aerobic and resistive training based on initial evaluation findings, risk stratification, comorbidities and participant's personal goals.  Provide advice, education, support and counseling about physical activity/exercise needs.;Develop an individualized exercise prescription for aerobic and resistive training based on initial evaluation findings, risk stratification, comorbidities and participant's personal goals.     Expected Outcomes  Short Term: Increase workloads from initial exercise prescription for resistance, speed, and METs.;Short Term: Perform resistance training exercises routinely during rehab and add in resistance training at home;Long Term: Improve cardiorespiratory fitness, muscular endurance and strength as measured by increased METs and functional capacity (6MWT)  Short Term: Increase workloads from initial exercise prescription for resistance, speed, and METs.;Short Term: Perform resistance training exercises routinely during rehab and add in resistance training at home;Long Term: Improve cardiorespiratory fitness, muscular endurance and strength as measured by increased METs and functional capacity (6MWT)  Short Term: Increase workloads from initial exercise prescription for resistance, speed, and METs.;Short Term: Perform resistance training exercises  routinely during rehab and add in resistance training at home;Long Term: Improve cardiorespiratory fitness, muscular endurance and strength as measured by increased METs and functional capacity (6MWT)     Able to understand and use rate of perceived exertion (RPE) scale  Yes  Yes  Yes     Intervention  Provide education and explanation on how to use RPE scale  Provide education and explanation on how to use RPE scale  Provide education and explanation on how to use RPE scale     Expected Outcomes  Short Term: Able to use RPE daily in rehab to express subjective intensity level;Long Term:  Able to use RPE to guide intensity level when exercising independently  Short Term: Able to use RPE daily in rehab to express subjective intensity level;Long Term:  Able to use RPE to guide intensity level when exercising independently  Short Term: Able to use RPE daily in rehab to express subjective intensity level;Long Term:  Able to use RPE to guide intensity level when exercising independently     Able to understand and use Dyspnea scale  Yes  Yes  Yes     Intervention  Provide education and explanation on how to use Dyspnea scale  Provide education and explanation on how to use Dyspnea scale  Provide education and explanation on how to use Dyspnea scale     Expected Outcomes  Short Term: Able to use Dyspnea scale daily in rehab to express subjective sense of shortness of breath during exertion;Long Term: Able to use Dyspnea scale to guide intensity level when exercising independently  Short Term: Able to use Dyspnea scale daily in rehab to express subjective sense of shortness of breath during exertion;Long Term: Able to use Dyspnea scale to guide intensity level when exercising independently  Short Term: Able to use Dyspnea scale daily in rehab to express subjective sense of shortness of breath during exertion;Long Term: Able to use Dyspnea scale to guide intensity level when exercising independently     Knowledge and  understanding of Target Heart Rate Range (THRR)  Yes  Yes  Yes     Intervention  Provide education and explanation of THRR including how the numbers were predicted and where they are located for reference  Provide education and explanation of THRR including  how the numbers were predicted and where they are located for reference  Provide education and explanation of THRR including how the numbers were predicted and where they are located for reference     Expected Outcomes  Short Term: Able to state/look up THRR;Long Term: Able to use THRR to govern intensity when exercising independently;Short Term: Able to use daily as guideline for intensity in rehab  Short Term: Able to state/look up THRR;Long Term: Able to use THRR to govern intensity when exercising independently;Short Term: Able to use daily as guideline for intensity in rehab  Short Term: Able to state/look up THRR;Long Term: Able to use THRR to govern intensity when exercising independently;Short Term: Able to use daily as guideline for intensity in rehab     Understanding of Exercise Prescription  Yes  Yes  Yes     Intervention  Provide education, explanation, and written materials on patient's individual exercise prescription  Provide education, explanation, and written materials on patient's individual exercise prescription  Provide education, explanation, and written materials on patient's individual exercise prescription     Expected Outcomes  Short Term: Able to explain program exercise prescription;Long Term: Able to explain home exercise prescription to exercise independently  Short Term: Able to explain program exercise prescription;Long Term: Able to explain home exercise prescription to exercise independently  Short Term: Able to explain program exercise prescription;Long Term: Able to explain home exercise prescription to exercise independently        Exercise Goals Re-Evaluation : Exercise Goals Re-Evaluation    Row Name 02/05/19 1122  04/10/19 0905           Exercise Goal Re-Evaluation   Exercise Goals Review  Increase Physical Activity;Increase Strength and Stamina;Able to understand and use rate of perceived exertion (RPE) scale;Able to understand and use Dyspnea scale;Knowledge and understanding of Target Heart Rate Range (THRR);Understanding of Exercise Prescription  Increase Physical Activity;Increase Strength and Stamina;Able to understand and use rate of perceived exertion (RPE) scale;Able to understand and use Dyspnea scale;Knowledge and understanding of Target Heart Rate Range (THRR);Understanding of Exercise Prescription      Comments  Pt has completed 3 exercise sessions. Pt missed a couple of visits following her last chemo session. Pt returned and experienced weakness. Pt currently exercises at 1.7 METs on the stepper. Will continue to monitor and progress as able.  Pt has not exercised since we ceased in person exercise. Pt will return 2/23. Will monitor and progress as able.      Expected Outcomes  Through exercise at rehab and at home, the patient will decrease shortness of breath with daily activities and feel confident in carrying out an exercise regime at home.   Through exercise at rehab and at home, the patient will decrease shortness of breath with daily activities and feel confident in carrying out an exercise regime at home.          Discharge Exercise Prescription (Final Exercise Prescription Changes): Exercise Prescription Changes - 02/20/19 1100      Response to Exercise   Blood Pressure (Admit)  126/70    Blood Pressure (Exercise)  140/80    Blood Pressure (Exit)  128/88    Heart Rate (Admit)  103 bpm    Heart Rate (Exercise)  129 bpm    Heart Rate (Exit)  102 bpm    Oxygen Saturation (Admit)  98 %    Oxygen Saturation (Exercise)  90 %    Oxygen Saturation (Exit)  98 %    Rating  of Perceived Exertion (Exercise)  12    Perceived Dyspnea (Exercise)  3    Duration  Continue with 30 min of  aerobic exercise without signs/symptoms of physical distress.    Intensity  THRR unchanged      Progression   Progression  Continue to progress workloads to maintain intensity without signs/symptoms of physical distress.      Resistance Training   Training Prescription  Yes    Weight  orange bands    Reps  10-15    Time  10 Minutes      Oxygen   Oxygen  Continuous    Liters  10      Treadmill   MPH  1.6    Grade  1    Minutes  15      NuStep   Level  2    SPM  80    Minutes  30       Nutrition:  Target Goals: Understanding of nutrition guidelines, daily intake of sodium <1584m, cholesterol <2017m calories 30% from fat and 7% or less from saturated fats, daily to have 5 or more servings of fruits and vegetables.  Biometrics: Pre Biometrics - 01/11/19 1502      Pre Biometrics   Grip Strength  18 kg        Nutrition Therapy Plan and Nutrition Goals: Nutrition Therapy & Goals - 02/01/19 1408      Nutrition Therapy   Diet  Mediterranean      Personal Nutrition Goals   Nutrition Goal  Pt to identify food quantities necessary to achieve weight loss of 6-24 lb at graduation from cardiac rehab.    Personal Goal #2  Pt to build a healthy plate including vegetables, fruits, whole grains, and low-fat dairy products in a heart healthy meal plan.      Intervention Plan   Intervention  Prescribe, educate and counsel regarding individualized specific dietary modifications aiming towards targeted core components such as weight, hypertension, lipid management, diabetes, heart failure and other comorbidities.;Nutrition handout(s) given to patient.    Expected Outcomes  Short Term Goal: Understand basic principles of dietary content, such as calories, fat, sodium, cholesterol and nutrients.;Short Term Goal: A plan has been developed with personal nutrition goals set during dietitian appointment.;Long Term Goal: Adherence to prescribed nutrition plan.       Nutrition  Assessments: Nutrition Assessments - 02/01/19 1436      Rate Your Plate Scores   Pre Score  46       Nutrition Goals Re-Evaluation: Nutrition Goals Re-Evaluation    [place] 02/01/19 1435             Goals   Current Weight  221 lb (100.2 kg)       Nutrition Goal  Pt to identify food quantities necessary to achieve weight loss of 6-24 lb at graduation from cardiac rehab.         Personal Goal #2 Re-Evaluation   Personal Goal #2  Pt to build a healthy plate including vegetables, fruits, whole grains, and low-fat dairy products in a heart healthy meal plan.          Nutrition Goals Discharge (Final Nutrition Goals Re-Evaluation): Nutrition Goals Re-Evaluation - 02/01/19 1435      Goals   Current Weight  221 lb (100.2 kg)    Nutrition Goal  Pt to identify food quantities necessary to achieve weight loss of 6-24 lb at graduation from cardiac rehab.  Personal Goal #2 Re-Evaluation   Personal Goal #2  Pt to build a healthy plate including vegetables, fruits, whole grains, and low-fat dairy products in a heart healthy meal plan.       Psychosocial: Target Goals: Acknowledge presence or absence of significant depression and/or stress, maximize coping skills, provide positive support system. Participant is able to verbalize types and ability to use techniques and skills needed for reducing stress and depression.  Initial Review & Psychosocial Screening: Initial Psych Review & Screening - 01/11/19 1505      Initial Review   Current issues with  Current Stress Concerns    Source of Stress Concerns  Chronic Illness    Comments  Is in need of losing weight so she can have a lung transplant, will need to go down to 180 pounds      [place]?  Yes      Barriers   Psychosocial barriers to participate in program  There are no identifiable barriers or psychosocial needs.      Screening Interventions   Interventions  Encouraged to exercise        Quality of Life Scores:  Scores of 19 and below usually indicate a poorer quality of life in these areas.  A difference of  2-3 points is a clinically meaningful difference.  A difference of 2-3 points in the total score of the Quality of Life Index has been associated with significant improvement in overall quality of life, self-image, physical symptoms, and general health in studies assessing change in quality of life.  PHQ-9: Recent Review Flowsheet Data    Depression screen [facility] 2/9 01/11/2019 02/13/2018 12/22/2017 08/10/2017   Decreased Interest 0 _0 Down, Depressed, Hopeless 0 1 0 1   PHQ - 2 Score 0 _1 Altered sleeping _2 Tired, decreased energy _3 Change in appetite _4 Feeling bad or failure about yourself  0 _5 Trouble concentrating 0 _6 Moving slowly or fidgety/restless 0 1 0 0   Suicidal thoughts 0 0 0 0   PHQ-9 Score - _7 Difficult doing work/chores Not difficult at all Very difficult Somewhat difficult Very difficult     Interpretation of Total Score  Total Score Depression Severity:  1-4 = Minimal depression, 5-9 = Mild depression, 10-14 = Moderate depression, 15-19 = Moderately severe depression, 20-27 = Severe depression   Psychosocial Evaluation and Intervention: Psychosocial Evaluation - 01/11/19 1506      Psychosocial Evaluation & Interventions   Interventions  Encouraged to exercise with the program and follow exercise prescription;Stress management education    Continue Psychosocial Services   No Follow up required       Psychosocial Re-Evaluation: Psychosocial Re-Evaluation    [place] Name 02/05/19 1058 04/09/19 1355           Psychosocial Re-Evaluation   Current issues with  Current Stress Concerns  Current Stress Concerns      Comments  Just started the program, has attended 3 exercise sessions, was absent for 1 week d/t side effects of chemotherapy.  She is considering a lung transplant but needs to  lose weight and has not lost any.  unable to assess psychosocial concerns due to department closure x 7 weeks, will assess on 04/17/2019 when in person exercise classes begin.  Expected Outcomes  No barriers or psychosocial concerns while in pulmonary rehab and that  No barriers to participation in pulmonary rehab      Interventions  Stress management education;Relaxation education given a meditation handout  Stress management education;Encouraged to attend Pulmonary Rehabilitation for the exercise      Continue Psychosocial Services   Follow up required by staff  No Follow up required        Initial Review   Source of Stress Concerns  Chronic Illness;Unable to participate in former interests or hobbies;Unable to perform yard/household activities a possible lung transplant  Chronic Illness;Unable to participate in former interests or hobbies;Unable to perform yard/household activities         Psychosocial Discharge (Final Psychosocial Re-Evaluation): Psychosocial Re-Evaluation - 04/09/19 1355      Psychosocial Re-Evaluation   Current issues with  Current Stress Concerns    Comments  unable to assess psychosocial concerns due to department closure x 7 weeks, will assess on 04/17/2019 when in person exercise classes begin.    Expected Outcomes  No barriers to participation in pulmonary rehab    Interventions  Stress management education;Encouraged to attend Pulmonary Rehabilitation for the exercise    Continue Psychosocial Services   No Follow up required      Initial Review   Source of Stress Concerns  Chronic Illness;Unable to participate in former interests or hobbies;Unable to perform yard/household activities       Education: Education Goals: Education classes will be provided on a weekly basis, covering required topics. Participant will state understanding/return demonstration of topics presented.  Learning Barriers/Preferences:   Education Topics: Risk Factor Reduction:   -Group instruction that is supported by a PowerPoint presentation. Instructor discusses the definition of a risk factor, different risk factors for pulmonary disease, and how the heart and lungs work together.     PULMONARY REHAB OTHER RESPIRATORY from 02/13/2019 in [place]  Date  01/16/19  Educator  -- [Handout]      Nutrition for Pulmonary Patient:  -Group instruction provided by PowerPoint slides, verbal discussion, and written materials to support subject matter. The instructor gives an explanation and review of healthy diet recommendations, which includes a discussion on weight management, recommendations for fruit and vegetable consumption, as well as protein, fluid, caffeine, fiber, sodium, sugar, and alcohol. Tips for eating when patients are short of breath are discussed.   PULMONARY REHAB OTHER RESPIRATORY from 02/13/2019 in [place]  Date  02/13/19  Educator  -- [Handout]      Pursed Lip Breathing:  -Group instruction that is supported by demonstration and informational handouts. Instructor discusses the benefits of pursed lip and diaphragmatic breathing and detailed demonstration on how to preform both.     Oxygen Safety:  -Group instruction provided by PowerPoint, verbal discussion, and written material to support subject matter. There is an overview of "What is Oxygen" and "Why do we need it".  Instructor also reviews how to create a safe environment for oxygen use, the importance of using oxygen as prescribed, and the risks of noncompliance. There is a brief discussion on traveling with oxygen and resources the patient may utilize.   PULMONARY REHAB OTHER RESPIRATORY from 12/15/2017 in [place]  Date  11/24/17  Educator  Cloyde Reams  Instruction Review Code  1- Verbalizes Understanding      Oxygen Equipment:  -Group instruction provided by [employer] utilizing  handouts, written materials, and  equipment demonstrations.   Signs and Symptoms:  -Group instruction provided by written material and verbal discussion to support subject matter. Warning signs and symptoms of infection, stroke, and heart attack are reviewed and when to call the physician/911 reinforced. Tips for preventing the spread of infection discussed.   PULMONARY REHAB OTHER RESPIRATORY from 12/15/2017 in [place]  Date  09/01/17  Educator  RN  Instruction Review Code  1- Verbalizes Understanding      Advanced Directives:  -Group instruction provided by verbal instruction and written material to support subject matter. Instructor reviews Advanced Directive laws and proper instruction for filling out document.   Pulmonary Video:  -Group video education that reviews the importance of medication and oxygen compliance, exercise, good nutrition, pulmonary hygiene, and pursed lip and diaphragmatic breathing for the pulmonary patient.   Exercise for the Pulmonary Patient:  -Group instruction that is supported by a PowerPoint presentation. Instructor discusses benefits of exercise, core components of exercise, frequency, duration, and intensity of an exercise routine, importance of utilizing pulse oximetry during exercise, safety while exercising, and options of places to exercise outside of rehab.     PULMONARY REHAB OTHER RESPIRATORY from 12/15/2017 in [place]  Date  09/22/17  Educator  Cloyde Reams  Instruction Review Code  1- Verbalizes Understanding      Pulmonary Medications:  -Verbally interactive group education provided by instructor with focus on inhaled medications and proper administration.   PULMONARY REHAB OTHER RESPIRATORY from 12/15/2017 in [place]  Date  11/01/17  Educator  pharm      Anatomy and Physiology of the Respiratory System and Intimacy:  -Group instruction  provided by PowerPoint, verbal discussion, and written material to support subject matter. Instructor reviews respiratory cycle and anatomical components of the respiratory system and their functions. Instructor also reviews differences in obstructive and restrictive respiratory diseases with examples of each. Intimacy, Sex, and Sexuality differences are reviewed with a discussion on how relationships can change when diagnosed with pulmonary disease. Common sexual concerns are reviewed.   PULMONARY REHAB OTHER RESPIRATORY from 12/15/2017 in [place]  Date  10/13/17  Educator  rn  Instruction Review Code  2- Demonstrated Understanding      MD DAY -A group question and answer session with a medical doctor that allows participants to ask questions that relate to their pulmonary disease state.   PULMONARY REHAB OTHER RESPIRATORY from 12/15/2017 in [place]  Date  12/15/17  Educator  [email or web address]  Instruction Review Code  2- Demonstrated Understanding      OTHER EDUCATION -Group or individual verbal, written, or video instructions that support the educational goals of the pulmonary rehab program.   PULMONARY REHAB OTHER RESPIRATORY from 02/13/2019 in [place]  Date  02/01/19  Educator  DF [meditation]  Instruction Review Code  2- Demonstrated Understanding      Holiday Eating Survival Tips:  -Group instruction provided by PowerPoint slides, verbal discussion, and written materials to support subject matter. The instructor gives patients tips, tricks, and techniques to help them not only survive but enjoy the holidays despite the onslaught of food that accompanies the holidays.   Knowledge Questionnaire Score: Knowledge Questionnaire Score - 01/11/19 1556      Knowledge Questionnaire Score   Pre Score  17/18       Core Components/Risk Factors/Patient Goals at Admission: Personal Goals and  Risk Factors at Admission - 01/11/19 1507      Core Components/Risk Factors/Patient Goals on Admission    Weight Management  Yes;Weight Loss    Admit Weight  224 lb 13.9 oz (102 kg)    Goal Weight: Short Term  220 lb 7.4 oz (100 kg)    Goal Weight: Long Term  180 lb (81.6 kg)    Improve shortness of breath with ADL's  Yes    Intervention  Provide education, individualized exercise plan and daily activity instruction to help decrease symptoms of SOB with activities of daily living.    Expected Outcomes  Short Term: Improve cardiorespiratory fitness to achieve a reduction of symptoms when performing ADLs;Long Term: Be able to perform more ADLs without symptoms or delay the onset of symptoms       Core Components/Risk Factors/Patient Goals Review:  Goals and Risk Factor Review    [place] Name 01/11/19 1514 02/05/19 1110 04/09/19 1357         Core Components/Risk Factors/Patient Goals Review   Personal Goals Review  Weight Management/Obesity;Develop more efficient breathing techniques such as purse lipped breathing and diaphragmatic breathing and practicing self-pacing with activity.;Stress;Increase knowledge of respiratory medications and ability to use respiratory devices properly.;Improve shortness of breath with ADL's  Weight Management/Obesity;Increase knowledge of respiratory medications and ability to use respiratory devices properly.;Improve shortness of breath with ADL's;Stress  Weight Management/Obesity;Develop more efficient breathing techniques such as purse lipped breathing and diaphragmatic breathing and practicing self-pacing with activity.;Increase knowledge of respiratory medications and ability to use respiratory devices properly.;Improve shortness of breath with ADL's     Review  --  She just started the program and it is too early to have met any program goals.  She has been slow to progress due to poor attendance, department has been closed x 7 weeks and will resume in person  exercise classes on 04/17/2019.  We will decide what levels she should be exercising at upon return.     Expected Outcomes  --  See admission goals.  See admission goals.        Core Components/Risk Factors/Patient Goals at Discharge (Final Review):  Goals and Risk Factor Review - 04/09/19 1357      Core Components/Risk Factors/Patient Goals Review   Personal Goals Review  Weight Management/Obesity;Develop more efficient breathing techniques such as purse lipped breathing and diaphragmatic breathing and practicing self-pacing with activity.;Increase knowledge of respiratory medications and ability to use respiratory devices properly.;Improve shortness of breath with ADL's    Review  She has been slow to progress due to poor attendance, department has been closed x 7 weeks and will resume in person exercise classes on 04/17/2019.  We will decide what levels she should be exercising at upon return.    Expected Outcomes  See admission goals.       ITP Comments:   Comments: ITP REVIEW Pt is making expected progress toward pulmonary rehab goals after completing 6 sessions. Recommend continued exercise, life style modification, education, and utilization of breathing techniques to increase stamina and strength and decrease shortness of breath with exertion.

## 2019-04-11 ENCOUNTER — Telehealth: Payer: Self-pay | Admitting: Internal Medicine

## 2019-04-11 MED ORDER — PROAIR HFA 108 (90 BASE) MCG/ACT IN AERS: 2.0000 | INHALATION_SPRAY | Freq: Four times a day (QID) | RESPIRATORY_TRACT | 5 refills | Status: DC | PRN

## 2019-04-11 NOTE — Telephone Encounter (Signed)
Spoke with pt. She is requesting a refill on Proair. Rx has been sent in. Nothing further was needed.

## 2019-04-12 ENCOUNTER — Encounter: Payer: Self-pay | Admitting: Adult Health

## 2019-04-12 ENCOUNTER — Ambulatory Visit (INDEPENDENT_AMBULATORY_CARE_PROVIDER_SITE_OTHER): Payer: BC Managed Care – PPO | Admitting: Adult Health

## 2019-04-12 DIAGNOSIS — Z79899 Other long term (current) drug therapy: Secondary | ICD-10-CM

## 2019-04-12 DIAGNOSIS — J9611 Chronic respiratory failure with hypoxia: Secondary | ICD-10-CM

## 2019-04-12 DIAGNOSIS — J8489 Other specified interstitial pulmonary diseases: Secondary | ICD-10-CM | POA: Diagnosis not present

## 2019-04-12 DIAGNOSIS — J849 Interstitial pulmonary disease, unspecified: Secondary | ICD-10-CM | POA: Diagnosis not present

## 2019-04-12 NOTE — Progress Notes (Signed)
Virtual Visit via Telephone Note  I connected with SMT. LODER on 04/12/19 at 10:00 AM EST by telephone and verified that I am speaking with the correct person using two identifiers.  Location: Patient: Home   Provider: Home    I discussed the limitations, risks, security and privacy concerns of performing an evaluation and management service by telephone and the availability of in person appointments. I also discussed with the patient that there may be a patient responsible charge related to this service. The patient expressed understanding and agreed to proceed.   History of Present Illness: 49 year old female never smoker seen for pulmonary consult November 22, 2017 to establish for evaluation of interstitial lung disease.  Patient has been followed at [facility].  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  Initially placed on CellCept.  In thousand 30 had a significant clinical decline and was started on oxygen.  She continued to have progressive decline with increasing oxygen demands.  She has been seen at [facility] with Dr. Threasa Alpha in September 2019.  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  Since that surgery she has noticed clinical decline.  In December 2019 she had progressive respiratory decline and increased symptom burden.  She was started on IV Cytoxan (using IV Cytoxan protocol modified version from [employer] protocol).  In December 2019 she was started on Ofev 150 mg twice daily.  After starting both of this she did have clinical improvement and was able to decrease oxygen demands at her current baseline which is 6 to 8 L at rest and 10 to 12 L with activity.  She was able to participate in pulmonary rehab.  Unfortunately with the COVID-19 pandemic she was changed from IV Cytoxan to oral Cytoxan.  She  was able to change back to IV Cytoxan in September 2020.  She remains on IV Cytoxan from September 2020 through November 2020.  Patient had significant and prolonged fatigue and malaise after Cytoxan infusions.  She also had flare of cough and shortness of breath.  She was treated for possible secondary bronchitis with antibiotics.  She had completed a full 1 year course of Cytoxan in November.  Last month patient was started on Imuran.  She has been on this for 2 weeks.  She is currently on Imuran 50 mg daily.. She says she is tolerating she has some initial nausea but that seems to have improved.  Patient says her breathing is essentially at baseline.  She says she gets short of breath with activity and has decreased ox levels with heavy exertion.  But does not feel like that she has changed much since last visit.  She has no flare of cough.  Appetite is fair. She was unable to come into the office today for visit or labs due to inclement weather.   Still has porta cath. Gets monthly flushes .   Going to start pulmonary rehab back next week.   Got Covid vaccine 2 weeks ago.    Patient Active Problem List   Diagnosis Date Noted  . Acute sore throat 10/04/2018  . Medication management 10/04/2018  . Rash 03/20/2018  . Leukopenia 03/20/2018  . ILD (interstitial lung disease) ([place]) 02/03/2018  . 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 On Cellcept therapy 04/19/2017  . Acute on chronic respiratory failure  with hypoxia ([place]) 04/18/2017  . Pedal edema 12/30/2016  . 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 Sinus tachycardia 10/12/2016  . Thoracic lymphadenopathy 10/12/2016  . 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 Morbid obesity with BMI of 40.0-44.9, adult ([place]) 12/09/2015  . Oxygen dependent 05/26/2015  . Morbid obesity ([place]) 04/02/2015  . Other specified interstitial pulmonary diseases ([place]) 10/30/2013  . Idiopathic  interstitial pneumonia ([place]) 11/12/2011  . Long term current use of systemic steroids 10/22/2011  . Diverticulitis of intestine 10/22/2011  . Hypothyroidism 10/22/2011  . Pneumonia 10/22/2011  . Cough 10/01/2011  . Interstitial pulmonary disease ([place]) 09/10/2011   Current Outpatient Medications on File Prior to Visit  Medication Sig Dispense Refill  . azaTHIOprine (IMURAN) 50 MG tablet Take 1 tablet (50 mg) by mouth daily for 2 weeks then increase to 2 tablets (177m) daily. 180 tablet 0  . benzonatate (TESSALON) 200 MG capsule Take 1 capsule (200 mg total) by mouth 3 (three) times daily as needed for cough. 30 capsule 1  . carvedilol (COREG) 6.25 MG tablet Take 6.25 mg by mouth 2 (two) times daily with a meal.    . clindamycin (CLEOCIN T) 1 % lotion     . furosemide (LASIX) 40 MG tablet Take 40 mg by mouth See admin instructions. Take 1 tablet (40 mg) daily, may repeat dose if needed for fluid retention.    . gabapentin (NEURONTIN) 100 MG capsule Take 1 capsule by mouth as needed.    . magnesium oxide (MAG-OX) 400 MG tablet Take 1 tablet (400 mg total) by mouth daily. 30 tablet 5  . metFORMIN (GLUCOPHAGE) 500 MG tablet Take 500 mg by mouth 2 (two) times daily.     . montelukast (SINGULAIR) 10 MG tablet Take 10 mg by mouth daily after supper.    .Marland KitchenOFEV 150 MG CAPS Take 150 mg by mouth 2 (two) times daily.  11  . ondansetron (ZOFRAN) 8 MG tablet TAKE 1 TABLET BY MOUTH EVERY 8 HOURS AS NEEDED FOR NAUSEA AND VOMITING 45 tablet 2  . potassium chloride (KLOR-CON) 10 MEQ tablet TAKE 1 TABLET BY MOUTH 2 TIMES A DAY 120 tablet 2  . predniSONE (DELTASONE) 10 MG tablet Take 10-253mas needed for SOB 30 tablet 3  . PROAIR HFA 108 (90 Base) MCG/ACT inhaler Inhale 2 puffs into the lungs every 6 (six) hours as needed for wheezing or shortness of breath. 18 g 5  . saccharomyces boulardii (FLORASTOR) 250 MG capsule Take 1 capsule (250 mg total) by mouth 2 (two) times daily. 60 capsule 0  . spironolactone  (ALDACTONE) 100 MG tablet Take 100 mg by mouth daily.    . Marland Kitchenulfamethoxazole-trimethoprim (BACTRIM) 400-80 MG tablet TAKE 1 TABLET BY MOUTH ONCE DAILY 90 tablet 1  . thyroid (ARMOUR) 90 MG tablet Take 90 mg by mouth daily before breakfast.     . Vitamin D, Ergocalciferol, (DRISDOL) 1.25 MG (50000 UT) CAPS capsule TAKE 1 CAPSULE BY MOUTH ONCE WEEKLY 3 capsule 0   No current facility-administered medications on file prior to visit.      Observations/Objective: Speaks in full sentences no audible distress   Assessment and Plan: Progressive ILD-recent viral bronchitis now resolved. Patient continues to have high symptom burden.  Completed a 1 year course of IV Cytoxan.  Recently started on Imuran.  ChIlseppears to be currently stable.  Going to be starting pulmonary rehab which will be very good for her as she has significant physical deconditioning.  Lab  work is pending patient will return next week.  She will continue on current regimen with Ofev 150 mg twice daily and daily prednisone at 10 mg daily She is to continue on Bactrim.   Chronic hypoxic respiratory failure oxygen dependent-patient is currently on 6 to 8 L of oxygen at rest 10 to 12 L with activity and up to 15 L with heavy exercise or exertion.  Plan  Patient Instructions  Continue on Imuran 50 mg daily. Lab work as planned next week, if labs are okay we will increase Imuran to 100 mg daily Continue on Ofev 150 mg twice daily Continue on prednisone 10 mg daily Continue on Bactrim-we will clarify dose and call you back Continue on oxygen-goal is to keep oxygen levels greater than 88 to 90% Go for pulmonary rehab as planned Second Covid vaccine as planned Follow-up in 4 weeks with Dr. [name] or Ieisha Gao NP and As needed   Please contact office for sooner follow up if symptoms do not improve or worsen or seek emergency care       Follow Up Instructions: Follow-up in 4 weeks and as needed    I discussed the  assessment and treatment plan with the patient. The patient was provided an opportunity to ask questions and all were answered. The patient agreed with the plan and demonstrated an understanding of the instructions.   The patient was advised to call back or seek an in-person evaluation if the symptoms worsen or if the condition fails to improve as anticipated.  I provided 30  minutes of non-face-to-face time during this encounter.   Rexene Edison, NP

## 2019-04-12 NOTE — Patient Instructions (Signed)
Continue on Imuran 50 mg daily. Lab work as planned next week, if labs are okay we will increase Imuran to 100 mg daily Continue on Ofev 150 mg twice daily Continue on prednisone 10 mg daily Continue on Bactrim-we will clarify dose and call you back Continue on oxygen-goal is to keep oxygen levels greater than 88 to 90% Go for pulmonary rehab as planned Second Covid vaccine as planned Follow-up in 4 weeks with Dr. [name] or Jacque Garrels NP and As needed   Please contact office for sooner follow up if symptoms do not improve or worsen or seek emergency care

## 2019-04-17 ENCOUNTER — Encounter: Payer: Self-pay | Admitting: *Deleted

## 2019-04-17 ENCOUNTER — Other Ambulatory Visit: Payer: Self-pay | Admitting: *Deleted

## 2019-04-17 ENCOUNTER — Encounter (HOSPITAL_COMMUNITY)
Admission: RE | Admit: 2019-04-17 | Discharge: 2019-04-17 | Disposition: A | Discharge status: Home / Self Care | Payer: BC Managed Care – PPO | Source: Ambulatory Visit | Attending: Internal Medicine | Admitting: Internal Medicine

## 2019-04-17 ENCOUNTER — Other Ambulatory Visit: Payer: Self-pay

## 2019-04-17 ENCOUNTER — Other Ambulatory Visit (INDEPENDENT_AMBULATORY_CARE_PROVIDER_SITE_OTHER): Payer: BC Managed Care – PPO

## 2019-04-17 VITALS — Wt 224.6 lb

## 2019-04-17 DIAGNOSIS — J84112 Idiopathic pulmonary fibrosis: Secondary | ICD-10-CM | POA: Diagnosis not present

## 2019-04-17 DIAGNOSIS — J849 Interstitial pulmonary disease, unspecified: Secondary | ICD-10-CM

## 2019-04-17 DIAGNOSIS — Z79899 Other long term (current) drug therapy: Secondary | ICD-10-CM

## 2019-04-17 DIAGNOSIS — Z7960 Long term (current) use of unspecified immunomodulators and immunosuppressants: Secondary | ICD-10-CM

## 2019-04-17 LAB — CBC WITH DIFFERENTIAL/PLATELET
Basophils Absolute: 0.1 10*3/uL (ref 0.0–0.1)
Basophils Relative: 0.6 % (ref 0.0–3.0)
Eosinophils Absolute: 0.1 10*3/uL (ref 0.0–0.7)
Eosinophils Relative: 0.7 % (ref 0.0–5.0)
HCT: 40.9 % (ref 36.0–46.0)
Hemoglobin: 12.9 g/dL (ref 12.0–15.0)
Lymphocytes Relative: 11.5 % — ABNORMAL LOW (ref 12.0–46.0)
Lymphs Abs: 1.1 10*3/uL (ref 0.7–4.0)
MCHC: 31.6 g/dL (ref 30.0–36.0)
MCV: 92.6 fl (ref 78.0–100.0)
Monocytes Absolute: 0.8 10*3/uL (ref 0.1–1.0)
Monocytes Relative: 7.9 % (ref 3.0–12.0)
Neutro Abs: 7.8 10*3/uL — ABNORMAL HIGH (ref 1.4–7.7)
Neutrophils Relative %: 79.3 % — ABNORMAL HIGH (ref 43.0–77.0)
Platelets: 238 10*3/uL (ref 150.0–400.0)
RBC: 4.42 Mil/uL (ref 3.87–5.11)
RDW: 14.6 % (ref 11.5–15.5)
WBC: 9.8 10*3/uL (ref 4.0–10.5)

## 2019-04-17 LAB — COMPREHENSIVE METABOLIC PANEL
ALT: 15 U/L (ref 0–35)
AST: 16 U/L (ref 0–37)
Albumin: 3.8 g/dL (ref 3.5–5.2)
Alkaline Phosphatase: 58 U/L (ref 39–117)
BUN: 12 mg/dL (ref 6–23)
CO2: 31 mEq/L (ref 19–32)
Calcium: 9.9 mg/dL (ref 8.4–10.5)
Chloride: 92 mEq/L — ABNORMAL LOW (ref 96–112)
Creatinine, Ser: 0.8 mg/dL (ref 0.40–1.20)
GFR: 75.73 mL/min (ref >60.00)
Glucose, Bld: 116 mg/dL — ABNORMAL HIGH (ref 70–99)
Potassium: 3.9 mEq/L (ref 3.5–5.1)
Sodium: 131 mEq/L — ABNORMAL LOW (ref 135–145)
Total Bilirubin: 0.3 mg/dL (ref 0.2–1.2)
Total Protein: 6.4 g/dL (ref 6.0–8.3)

## 2019-04-17 NOTE — Addendum Note (Signed)
Addended by: Parke Poisson E on: 04/17/2019 10:53 AM   Modules accepted: Orders

## 2019-04-17 NOTE — Addendum Note (Signed)
Addended by: Suzzanne Cloud E on: 04/17/2019 12:05 PM   Modules accepted: Orders

## 2019-04-17 NOTE — Progress Notes (Signed)
Daily Session Note  Patient Details  Name: Tasha Ballard MRN: [id number] Date of Birth: 16-Dec-1970 Referring Provider:     Pulmonary Rehab Walk Test from 01/11/2019 in [place]  Referring Provider  Dr. [name]      Encounter Date: 04/17/2019  Check In: Session Check In - 04/17/19 1030      Check-In   Supervising physician immediately available to respond to emergencies  [facility] immediately available    Physician(s)  Dr. Cyndia Skeeters    Location  [facility]    Staff Present  Rosebud Poles, RN, Bjorn Loser, MS, Exercise Physiologist;Lisa Ysidro Evert, RN    Virtual Visit  No    Medication changes reported      No    Fall or balance concerns reported     No    Tobacco Cessation  No Change    Warm-up and Cool-down  Performed as group-led instruction    Resistance Training Performed  Yes    VAD Patient?  No    PAD/SET Patient?  No      Pain Assessment   Currently in Pain?  No/denies       Capillary Blood Glucose: No results found for this or any previous visit (from the past 24 hour(s)). POCT Glucose - 04/17/19 1137      POCT Blood Glucose   Pre-Exercise  122 mg/dL    Post-Exercise  128 mg/dL      Exercise Prescription Changes - 04/17/19 1100      Response to Exercise   Blood Pressure (Admit)  130/84    Blood Pressure (Exercise)  132/70    Blood Pressure (Exit)  110/70    Heart Rate (Admit)  108 bpm    Heart Rate (Exercise)  110 bpm    Heart Rate (Exit)  107 bpm    Oxygen Saturation (Admit)  96 %    Oxygen Saturation (Exercise)  97 %    Oxygen Saturation (Exit)  95 %    Rating of Perceived Exertion (Exercise)  11    Perceived Dyspnea (Exercise)  2    Duration  Continue with 30 min of aerobic exercise without signs/symptoms of physical distress.    Intensity  THRR unchanged      Progression   Progression  Continue to progress workloads to maintain intensity without signs/symptoms of physical distress.       Resistance Training   Training Prescription  Yes    Weight  orange bands    Reps  10-15    Time  10 Minutes      Oxygen   Oxygen  Continuous    Liters  8      NuStep   Level  2    SPM  80    Minutes  30    METs  1.6       Social History   Tobacco Use  Smoking Status Never Smoker  Smokeless Tobacco Never Used    Goals Met:  Proper associated with RPD/PD & O2 Sat Improved SOB with ADL's Exercise tolerated well  Goals Unmet:  Not Applicable  Comments: Service time is from 1030 to 1127.    Dr. Fransico Him is Medical Director for Cardiac Rehab at [facility].

## 2019-04-18 ENCOUNTER — Telehealth: Payer: Self-pay | Admitting: Internal Medicine

## 2019-04-18 NOTE — Telephone Encounter (Addendum)
Tasha Ballard, Patient had a telephone visit with Tasha Ballard on 2.18.21 and was recommended to follow up with you or TP.  Patient has not had a F2F visit with you since Dec 20201 and feels that it is time.  You have no openings except for a 15 min slot on 3/16 ILD clinic.  May this be used for patient or does she require a 30 min appt slot?  Or is it okay for patient to follow up with Tasha again?  Thank you.  Assessment and Plan: Progressive ILD-recent viral bronchitis now resolved. Patient continues to have high symptom burden.  Completed a 1 year course of IV Cytoxan.  Recently started on Imuran.  Tasha Ballard appears to be currently stable.  Going to be starting pulmonary rehab which will be very good for her as she has significant physical deconditioning.  Lab work is pending patient will return next week.  She will continue on current regimen with Ofev 150 mg twice daily and daily prednisone at 10 mg daily She is to continue on Bactrim.    Chronic hypoxic respiratory failure oxygen dependent-patient is currently on 6 to 8 L of oxygen at rest 10 to 12 L with activity and up to 15 L with heavy exercise or exertion.   Plan  Patient Instructions  Continue on Imuran 50 mg daily. Lab work as planned next week, if labs are okay we will increase Imuran to 100 mg daily Continue on Ofev 150 mg twice daily Continue on prednisone 10 mg daily Continue on Bactrim-we will clarify dose and call you back Continue on oxygen-goal is to keep oxygen levels greater than 88 to 90% Go for pulmonary rehab as planned Second Covid vaccine as planned Follow-up in 4 weeks with Tasha. Tasha Ballard or Parrett NP and As needed   Please contact office for sooner follow up if symptoms do not improve or worsen or seek emergency care

## 2019-04-19 ENCOUNTER — Other Ambulatory Visit: Payer: Self-pay

## 2019-04-19 ENCOUNTER — Encounter (HOSPITAL_COMMUNITY)
Admission: RE | Admit: 2019-04-19 | Discharge: 2019-04-19 | Disposition: A | Discharge status: Home / Self Care | Payer: BC Managed Care – PPO | Source: Ambulatory Visit | Attending: Internal Medicine | Admitting: Internal Medicine

## 2019-04-19 DIAGNOSIS — J849 Interstitial pulmonary disease, unspecified: Secondary | ICD-10-CM

## 2019-04-19 DIAGNOSIS — J84112 Idiopathic pulmonary fibrosis: Secondary | ICD-10-CM | POA: Diagnosis not present

## 2019-04-19 LAB — QUANTIFERON-TB GOLD PLUS
Mitogen-NIL: >10 IU/mL
NIL: 0.08 IU/mL
QuantiFERON-TB Gold Plus: NEGATIVE
TB1-NIL: <0 IU/mL
TB2-NIL: 0 IU/mL

## 2019-04-19 LAB — HEPATITIS PANEL, ACUTE
Hep A IgM: NONREACTIVE
Hep B C IgM: NONREACTIVE
Hepatitis B Surface Ag: NONREACTIVE
Hepatitis C Ab: NONREACTIVE
SIGNAL TO CUT-OFF: 0.01 (ref <1.00)

## 2019-04-19 NOTE — Telephone Encounter (Signed)
Looked over MR's schedule for open slot's in March 2021 and there are none open yet- will continue to hold and keep watching out for this.

## 2019-04-19 NOTE — Telephone Encounter (Signed)
I will see her in mach 2021 next few weeks. Opening my new slots. She will need to be 30 min. Pls watch out for slots opening up over next few days

## 2019-04-19 NOTE — Progress Notes (Signed)
Daily Session Note  Patient Details  Name: Tasha Ballard MRN: [id number] Date of Birth: 04-Jan-1971 Referring Provider:     Pulmonary Rehab Walk Test from 01/11/2019 in [place]  Referring Provider  Dr. [name]      Encounter Date: 04/19/2019  Check In: Session Check In - 04/19/19 1050      Check-In   Supervising physician immediately available to respond to emergencies  [facility] immediately available    Physician(s)  Dr. Cyndia Skeeters    Location  [facility]    Staff Present  Rosebud Poles, RN, Bjorn Loser, MS, Exercise Physiologist;Lisa Ysidro Evert, RN    Virtual Visit  No    Medication changes reported      No    Fall or balance concerns reported     No    Tobacco Cessation  No Change    Warm-up and Cool-down  Performed as group-led instruction    Resistance Training Performed  Yes    VAD Patient?  No    PAD/SET Patient?  No      Pain Assessment   Currently in Pain?  No/denies    Multiple Pain Sites  No       Capillary Blood Glucose: No results found for this or any previous visit (from the past 24 hour(s)).    Social History   Tobacco Use  Smoking Status Never Smoker  Smokeless Tobacco Never Used    Goals Met:  Proper associated with RPD/PD & O2 Sat Exercise tolerated well Strength training completed today  Goals Unmet:  Not Applicable  Comments: Service time is from 1040 to [place]    Dr. Fransico Him is Medical Director for Cardiac Rehab at [facility].

## 2019-04-24 ENCOUNTER — Other Ambulatory Visit: Payer: Self-pay

## 2019-04-24 ENCOUNTER — Ambulatory Visit (HOSPITAL_COMMUNITY)
Admission: RE | Admit: 2019-04-24 | Discharge: 2019-04-24 | Disposition: A | Discharge status: Home / Self Care | Payer: BC Managed Care – PPO | Source: Ambulatory Visit | Attending: Internal Medicine | Admitting: Internal Medicine

## 2019-04-24 ENCOUNTER — Encounter (HOSPITAL_COMMUNITY)
Admission: RE | Admit: 2019-04-24 | Discharge: 2019-04-24 | Disposition: A | Discharge status: Home / Self Care | Payer: BC Managed Care – PPO | Source: Ambulatory Visit | Attending: Internal Medicine | Admitting: Internal Medicine

## 2019-04-24 DIAGNOSIS — J84112 Idiopathic pulmonary fibrosis: Secondary | ICD-10-CM | POA: Insufficient documentation

## 2019-04-24 DIAGNOSIS — J849 Interstitial pulmonary disease, unspecified: Secondary | ICD-10-CM

## 2019-04-24 DIAGNOSIS — Z452 Encounter for adjustment and management of vascular access device: Secondary | ICD-10-CM | POA: Insufficient documentation

## 2019-04-24 MED ORDER — SODIUM CHLORIDE 0.9% FLUSH
10.0000 mL | INTRAVENOUS | Status: AC | PRN
  Administered 2019-04-24: 10 mL

## 2019-04-24 MED ORDER — HEPARIN SOD (PORK) LOCK FLUSH 100 UNIT/ML IV SOLN
500.0000 [IU] | INTRAVENOUS | Status: AC | PRN
  Administered 2019-04-24: 500 [IU]

## 2019-04-24 NOTE — Telephone Encounter (Signed)
Spoke with pt, she is awaiting TP recommendations on whether to increase the Imuran dose. She stated she had blood work done for TP to review. Please advise.   The covid vaccine should not interfere with the CT?

## 2019-04-24 NOTE — Telephone Encounter (Signed)
Called and spoke with Patient.  [name],NP recommendations given.  Understanding stated. Nothing further at this time.

## 2019-04-24 NOTE — Progress Notes (Signed)
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 PAC flushed with Heparin and 0.9% Sodium Chloride and de-accessed. Patient alert, oriented and ambulatory at discharge.             

## 2019-04-24 NOTE — Telephone Encounter (Signed)
ATC Patient.  Left message on Patient's mobile and home numbers to call back. Dr. [name] has openings for 49mn appointments this month.

## 2019-04-24 NOTE — Telephone Encounter (Signed)
Fine for covid vaccine .   Dr. [name] did labs.   They look okay to me , can increase Imuran to 171m daily   Make sure she has follow up

## 2019-04-24 NOTE — Progress Notes (Signed)
Daily Session Note  Patient Details  Name: Tasha Ballard MRN: [id number] Date of Birth: March 28, 1970 Referring Provider:     Pulmonary Rehab Walk Test from 01/11/2019 in [place]  Referring Provider  Dr. [name]      Encounter Date: 04/24/2019  Check In: Session Check In - 04/24/19 1037      Check-In   Supervising physician immediately available to respond to emergencies  [facility] immediately available    Physician(s)  Dr. Doristine Bosworth    Location  [facility]    Staff Present  Rosebud Poles, RN, Bjorn Loser, MS, Exercise Physiologist;Brittany Durene Fruits, BS, ACSM CEP, Exercise Physiologist    Virtual Visit  No    Medication changes reported      No    Fall or balance concerns reported     No    Tobacco Cessation  No Change    Warm-up and Cool-down  Performed as group-led instruction    Resistance Training Performed  Yes    VAD Patient?  No    PAD/SET Patient?  No      Pain Assessment   Currently in Pain?  No/denies    Multiple Pain Sites  No       Capillary Blood Glucose: No results found for this or any previous visit (from the past 24 hour(s)).    Social History   Tobacco Use  Smoking Status Never Smoker  Smokeless Tobacco Never Used    Goals Met:  Independence with exercise equipment Exercise tolerated well Strength training completed today  Goals Unmet:  Not Applicable  Comments: Service time is from 1025 to 1120    Dr. Fransico Him is Medical Director for Cardiac Rehab at [facility].

## 2019-04-24 NOTE — Telephone Encounter (Signed)
Pt scheduled 3/16 w/Dr. [name].  Pt was wondering if the covid vaccine would interfere with CT scan she is having done 3/9.  Also wanted to know about the dosing of Imuran.  Please advise.

## 2019-04-26 ENCOUNTER — Other Ambulatory Visit: Payer: Self-pay

## 2019-04-26 ENCOUNTER — Encounter (HOSPITAL_COMMUNITY)
Admission: RE | Admit: 2019-04-26 | Discharge: 2019-04-26 | Disposition: A | Discharge status: Home / Self Care | Payer: BC Managed Care – PPO | Source: Ambulatory Visit | Attending: Internal Medicine | Admitting: Internal Medicine

## 2019-04-26 VITALS — Wt 222.9 lb

## 2019-04-26 DIAGNOSIS — J84112 Idiopathic pulmonary fibrosis: Secondary | ICD-10-CM | POA: Diagnosis not present

## 2019-04-26 DIAGNOSIS — J849 Interstitial pulmonary disease, unspecified: Secondary | ICD-10-CM

## 2019-04-26 NOTE — Telephone Encounter (Signed)
See 2.24.2021 phone note

## 2019-04-26 NOTE — Progress Notes (Signed)
Daily Session Note  Patient Details  Name: Tasha Ballard MRN: [id number] Date of Birth: 1970-08-05 Referring Provider:     Pulmonary Rehab Walk Test from 01/11/2019 in [place]  Referring Provider  Dr. [name]      Encounter Date: 04/26/2019  Check In: Session Check In - 04/26/19 1147      Check-In   Supervising physician immediately available to respond to emergencies  [facility] immediately available    Physician(s)  Dr. Broadus John    Location  [facility]    Staff Present  Rosebud Poles, RN, Bjorn Loser, MS, Exercise Physiologist;Brittany Durene Fruits, BS, ACSM CEP, Exercise Physiologist    Virtual Visit  No    Medication changes reported      No    Fall or balance concerns reported     No    Tobacco Cessation  No Change    Warm-up and Cool-down  Performed on first and last piece of equipment    Resistance Training Performed  Yes    VAD Patient?  No    PAD/SET Patient?  No      Pain Assessment   Currently in Pain?  No/denies    Pain Score  0-No pain    Multiple Pain Sites  No       Capillary Blood Glucose: No results found for this or any previous visit (from the past 24 hour(s)).    Social History   Tobacco Use  Smoking Status Never Smoker  Smokeless Tobacco Never Used    Goals Met:  Independence with exercise equipment Exercise tolerated well Strength training completed today  Goals Unmet:  Not Applicable  Comments: Service time is from 1030 to 1120    Dr. Rush Farmer is Medical Director for Pulmonary Rehab at [facility].

## 2019-04-27 ENCOUNTER — Encounter (HOSPITAL_COMMUNITY): Payer: BC Managed Care – PPO

## 2019-04-27 ENCOUNTER — Ambulatory Visit (INDEPENDENT_AMBULATORY_CARE_PROVIDER_SITE_OTHER)
Admission: RE | Admit: 2019-04-27 | Discharge: 2019-04-27 | Disposition: A | Discharge status: Home / Self Care | Payer: BC Managed Care – PPO | Source: Ambulatory Visit | Attending: Internal Medicine | Admitting: Internal Medicine

## 2019-04-27 ENCOUNTER — Telehealth: Payer: Self-pay | Admitting: Internal Medicine

## 2019-04-27 DIAGNOSIS — J849 Interstitial pulmonary disease, unspecified: Secondary | ICD-10-CM

## 2019-04-27 NOTE — Telephone Encounter (Signed)
Received a call report from [place] at [facility] Radiology on HRCT performed today:   IMPRESSION: 1. New 1.0 cm solid pulmonary nodule in the medial basilar left lower lobe. Consider one of the following in 3 months for both low-risk and high-risk individuals: (a) repeat chest CT, (b) follow-up PET-CT, or (c) tissue sampling. This recommendation follows the consensus statement: Guidelines for Management of Incidental Pulmonary Nodules Detected on CT Images: From the Fleischner Society 2017; Radiology 2017; 284:228-243. 2. Spectrum of findings compatible with fibrotic interstitial lung disease with mild basilar predominance and mild honeycombing, compatible with usual interstitial pneumonia (UIP). No convincing interval progression since 11/20/2018. Findings are consistent with UIP per consensus guidelines: Diagnosis of Idiopathic Pulmonary Fibrosis: An Official ATS/ERS/JRS/ALAT Clinical Practice Guideline. [place], Iss 5, [id number], Oct 23 2016. 3. Aortic Atherosclerosis (ICD10-I70.0). Will forward to Dr [name]

## 2019-04-27 NOTE — Telephone Encounter (Signed)
Called pt and advised message from the provider. Pt understood and verbalized understanding. Nothing further is needed.

## 2019-04-27 NOTE — Telephone Encounter (Signed)
   Let patient know that there is a new nodule and I will d/w her during mid march 2021 visit

## 2019-04-30 ENCOUNTER — Ambulatory Visit: Payer: BC Managed Care – PPO | Attending: Internal Medicine

## 2019-04-30 DIAGNOSIS — Z23 Encounter for immunization: Secondary | ICD-10-CM

## 2019-04-30 NOTE — Progress Notes (Signed)
   Covid-19 Vaccination Clinic  Name:  Tasha Ballard    MRN: [id number] DOB: 04-15-1970  04/30/2019  [name] was observed post Covid-19 immunization for 15 minutes without incident. She was provided with Vaccine Information Sheet and instruction to access the V-Safe system.   [name] was instructed to call 911 with any severe reactions post vaccine: Marland Kitchen Difficulty breathing  . Swelling of face and throat  . A fast heartbeat  . A bad rash all over body  . Dizziness and weakness   Immunizations Administered    Name Date Dose VIS Date Route   Pfizer COVID-19 Vaccine 04/30/2019 12:17 PM 0.3 mL 02/02/2019 Intramuscular   Manufacturer: [place]   Lot: [id number]   [place]: [address]-8

## 2019-05-01 ENCOUNTER — Telehealth (HOSPITAL_COMMUNITY): Payer: Self-pay | Admitting: Family Medicine

## 2019-05-01 ENCOUNTER — Encounter (HOSPITAL_COMMUNITY): Payer: BC Managed Care – PPO

## 2019-05-01 ENCOUNTER — Inpatient Hospital Stay: Admission: RE | Admit: 2019-05-01 | Payer: BC Managed Care – PPO | Source: Ambulatory Visit

## 2019-05-03 ENCOUNTER — Encounter (HOSPITAL_COMMUNITY)
Admission: RE | Admit: 2019-05-03 | Discharge: 2019-05-03 | Disposition: A | Discharge status: Home / Self Care | Payer: BC Managed Care – PPO | Source: Ambulatory Visit | Attending: Internal Medicine | Admitting: Internal Medicine

## 2019-05-03 ENCOUNTER — Other Ambulatory Visit: Payer: Self-pay

## 2019-05-03 DIAGNOSIS — J849 Interstitial pulmonary disease, unspecified: Secondary | ICD-10-CM

## 2019-05-03 DIAGNOSIS — J84112 Idiopathic pulmonary fibrosis: Secondary | ICD-10-CM | POA: Diagnosis not present

## 2019-05-03 NOTE — Progress Notes (Signed)
Daily Session Note  Patient Details  Name: Tasha Ballard MRN: [id number] Date of Birth: 02-10-1971 Referring Provider:     Pulmonary Rehab Walk Test from 01/11/2019 in [place]  Referring Provider  Dr. [name]      Encounter Date: 05/03/2019  Check In: Session Check In - 05/03/19 1121      Check-In   Supervising physician immediately available to respond to emergencies  [facility] immediately available    Physician(s)  Dr. Dessa Phi    Location  [facility]    Staff Present  Rosebud Poles, RN, Bjorn Loser, MS, Exercise Physiologist;Carter Kassel Ysidro Evert, RN    Virtual Visit  No    Medication changes reported      No    Fall or balance concerns reported     No    Tobacco Cessation  No Change    Warm-up and Cool-down  Performed as group-led instruction    Resistance Training Performed  Yes    VAD Patient?  No    PAD/SET Patient?  No      Pain Assessment   Currently in Pain?  No/denies    Multiple Pain Sites  No       Capillary Blood Glucose: No results found for this or any previous visit (from the past 24 hour(s)).    Social History   Tobacco Use  Smoking Status Never Smoker  Smokeless Tobacco Never Used    Goals Met:  Exercise tolerated well No report of cardiac concerns or symptoms Strength training completed today  Goals Unmet:  Not Applicable  Comments: Service time is from 1030 to 1125    Dr. Fransico Him is Medical Director for Cardiac Rehab at [facility].

## 2019-05-08 ENCOUNTER — Other Ambulatory Visit: Payer: Self-pay

## 2019-05-08 ENCOUNTER — Telehealth: Payer: Self-pay | Admitting: Internal Medicine

## 2019-05-08 ENCOUNTER — Encounter: Payer: Self-pay | Admitting: Internal Medicine

## 2019-05-08 ENCOUNTER — Encounter (HOSPITAL_COMMUNITY)
Admission: RE | Admit: 2019-05-08 | Discharge: 2019-05-08 | Disposition: A | Discharge status: Home / Self Care | Payer: BC Managed Care – PPO | Source: Ambulatory Visit | Attending: Internal Medicine | Admitting: Internal Medicine

## 2019-05-08 ENCOUNTER — Ambulatory Visit (INDEPENDENT_AMBULATORY_CARE_PROVIDER_SITE_OTHER): Payer: BC Managed Care – PPO | Admitting: Internal Medicine

## 2019-05-08 VITALS — BP 110/70 | HR 99 | Ht 62.0 in | Wt 220.6 lb

## 2019-05-08 DIAGNOSIS — J84112 Idiopathic pulmonary fibrosis: Secondary | ICD-10-CM | POA: Diagnosis not present

## 2019-05-08 DIAGNOSIS — J9611 Chronic respiratory failure with hypoxia: Secondary | ICD-10-CM | POA: Diagnosis not present

## 2019-05-08 DIAGNOSIS — J849 Interstitial pulmonary disease, unspecified: Secondary | ICD-10-CM

## 2019-05-08 DIAGNOSIS — R918 Other nonspecific abnormal finding of lung field: Secondary | ICD-10-CM

## 2019-05-08 DIAGNOSIS — Z79899 Other long term (current) drug therapy: Secondary | ICD-10-CM

## 2019-05-08 DIAGNOSIS — Z5181 Encounter for therapeutic drug level monitoring: Secondary | ICD-10-CM

## 2019-05-08 DIAGNOSIS — R911 Solitary pulmonary nodule: Secondary | ICD-10-CM

## 2019-05-08 DIAGNOSIS — J8489 Other specified interstitial pulmonary diseases: Secondary | ICD-10-CM

## 2019-05-08 LAB — HEPATIC FUNCTION PANEL
ALT: 14 U/L (ref 0–35)
AST: 18 U/L (ref 0–37)
Albumin: 4.2 g/dL (ref 3.5–5.2)
Alkaline Phosphatase: 54 U/L (ref 39–117)
Bilirubin, Direct: 0.1 mg/dL (ref 0.0–0.3)
Total Bilirubin: 0.4 mg/dL (ref 0.2–1.2)
Total Protein: 7.3 g/dL (ref 6.0–8.3)

## 2019-05-08 LAB — CBC WITH DIFFERENTIAL/PLATELET
Basophils Absolute: 0.1 10*3/uL (ref 0.0–0.1)
Basophils Relative: 0.5 % (ref 0.0–3.0)
Eosinophils Absolute: 0.1 10*3/uL (ref 0.0–0.7)
Eosinophils Relative: 0.6 % (ref 0.0–5.0)
HCT: 42.9 % (ref 36.0–46.0)
Hemoglobin: 13.9 g/dL (ref 12.0–15.0)
Lymphocytes Relative: 7.6 % — ABNORMAL LOW (ref 12.0–46.0)
Lymphs Abs: 0.9 10*3/uL (ref 0.7–4.0)
MCHC: 32.5 g/dL (ref 30.0–36.0)
MCV: 91.2 fl (ref 78.0–100.0)
Monocytes Absolute: 0.7 10*3/uL (ref 0.1–1.0)
Monocytes Relative: 5.8 % (ref 3.0–12.0)
Neutro Abs: 10.6 10*3/uL — ABNORMAL HIGH (ref 1.4–7.7)
Neutrophils Relative %: 85.5 % — ABNORMAL HIGH (ref 43.0–77.0)
Platelets: 281 10*3/uL (ref 150.0–400.0)
RBC: 4.7 Mil/uL (ref 3.87–5.11)
RDW: 14.3 % (ref 11.5–15.5)
WBC: 12.4 10*3/uL — ABNORMAL HIGH (ref 4.0–10.5)

## 2019-05-08 LAB — MAGNESIUM: Magnesium: 2 mg/dL (ref 1.5–2.5)

## 2019-05-08 LAB — BASIC METABOLIC PANEL
BUN: 15 mg/dL (ref 6–23)
CO2: 34 mEq/L — ABNORMAL HIGH (ref 19–32)
Calcium: 10.1 mg/dL (ref 8.4–10.5)
Chloride: 86 mEq/L — ABNORMAL LOW (ref 96–112)
Creatinine, Ser: 1.03 mg/dL (ref 0.40–1.20)
GFR: 56.56 mL/min — ABNORMAL LOW (ref >60.00)
Glucose, Bld: 147 mg/dL — ABNORMAL HIGH (ref 70–99)
Potassium: 3.6 mEq/L (ref 3.5–5.1)
Sodium: 132 mEq/L — ABNORMAL LOW (ref 135–145)

## 2019-05-08 LAB — PHOSPHORUS: Phosphorus: 4 mg/dL (ref 2.3–4.6)

## 2019-05-08 LAB — POTASSIUM: Potassium: 3.6 mEq/L (ref 3.5–5.1)

## 2019-05-08 NOTE — Progress Notes (Signed)
Daily Session Note  Patient Details  Name: Tasha Ballard MRN: [id number] Date of Birth: 02-12-1971 Referring Provider:     Pulmonary Rehab Walk Test from 01/11/2019 in [place]  Referring Provider  Dr. [name]      Encounter Date: 05/08/2019  Check In: Session Check In - 05/08/19 1021      Check-In   Supervising physician immediately available to respond to emergencies  [facility] immediately available    Physician(s)  Dr. Dessa Phi    Location  [facility]    Staff Present  Rosebud Poles, RN, Bjorn Loser, MS, Exercise Physiologist;Lawerence Dery Ysidro Evert, RN    Virtual Visit  No    Medication changes reported      No    Fall or balance concerns reported     No    Tobacco Cessation  No Change    Warm-up and Cool-down  Performed as group-led instruction    Resistance Training Performed  Yes    VAD Patient?  No    PAD/SET Patient?  No      Pain Assessment   Currently in Pain?  No/denies    Multiple Pain Sites  No       Capillary Blood Glucose: No results found for this or any previous visit (from the past 24 hour(s)).    Social History   Tobacco Use  Smoking Status Never Smoker  Smokeless Tobacco Never Used    Goals Met:  Exercise tolerated well No report of cardiac concerns or symptoms Strength training completed today  Goals Unmet:  Not Applicable  Comments: Service time is from 0945 to [place]    Dr. Fransico Him is Medical Director for Cardiac Rehab at [facility].

## 2019-05-08 NOTE — Progress Notes (Signed)
I have reviewed a Home Exercise Prescription with Tasha Ballard . [name] is  currently exercising at home.  The patient was advised to walk and use her Nu Step 2 days a week for 30-45 minutes.  [name] and I discussed how to progress their exercise prescription.  The patient stated that their goals were to lose weight and gain strength.  The patient stated that they understand the exercise prescription.  We reviewed exercise guidelines, target heart rate during exercise, RPE Scale, weather conditions, NTG use, endpoints for exercise, warmup and cool down.  Patient is encouraged to come to me with any questions. I will continue to follow up with the patient to assist them with progression and safety.

## 2019-05-08 NOTE — Progress Notes (Signed)
-Lung function November 08, 2017: FVC 0.9 L / 27%, DLCO 5.86/29%  - HRCT 11/08/17 Rt diaphragm elevation ? Eventration v paresis.  Esophagogram November 11, 2017 - > 11/11/17 Tasha Ballard - Normal  Dr. Threasa Alpha at [facility].She was started on  IOV 11/22/2017  Chief Complaint  Patient presents with  . Consult    consut due to pulmonary fibrosis. Pt has been followed by [facility] and last PFT was peformed 8/14 at [facility]. Pt does have complaints of SOB with exertion which she states has become better due to rehab. Pt also has a dry hacking cough with mild production of mucus. Denies any CP.    Is seen by presents to the ILD clinic.  This is her first visit with Tasha Ballard.  History is obtained from her and review of the [place] where she is to follow-up with Dr. Hortencia Pilar.  Interstitial lung disease clinic integrated interstitial lung disease questionnaire: Symptoms: Shortness of breath for years.  Gradually worse.  Currently level 5 shortness of breath walking up stairs or walking up a hill with oxygen.  Level for shortness of breath for sweeping or vacuuming and level 3 shortness of breath for shopping or picking up things and doing laundry.  Level 3 shortness of breath or standing up from a chair or taking a shower making the bed and level 1 dyspnea for brushing teeth.  She also has a cough for 6 years which is getting worse.  On and off that is phlegm.  Occasional wheezing present there is associated fatigue.  She tells me that she had undiagnosed and unrecognized ILD for years.  This was then picked up at [facility] and then she got referred to [facility].  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  At this time she was placed on CellCept.  Then in 2016 she got somewhat worse and was placed on 2 L nasal cannula continuously.  In 2017 she tells me that she had a aortic aneurysm and this  was repaired at [facility].  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  And then for the last year and a half or so she has been on significantly worsening hypoxemia.  In the spring 2019 she is requiring 8 L at rest and 15 L at exertion.  Apparently Dr. Lauris Chroman then recommended Rituxan infusions which she got to 60 days apart with the last one being approximately in April of May 2019.  After that she is somewhat improved requiring only 9-10 L with exertion.  She has been attending pulmonary rehabilitation which has been helping.  She is obese and unable to lose weight although she is lost some.  She believes a lot of her oxygen needs might be related to deviated nasal septum/septal perforation.  She subsequently followed up with Dr. Ruthann Cancer at [facility] in the summer 2019.  I reviewed the notes.  Palliative care was recommended because of progressive respiratory failure.  She has never seen the transplant clinic at [facility] but she recommends that because of obesity and diaphragmatic dysfunction that she would not be a good candidate for transplant .  She was then frustrated with experience because she wants to fight the disease.  She then drove to [facility] and saw Dr. Elisabeth Cara at [facility] 1 week ago.  She spent  substantial number of days there.  At this point in time she showed me an email from him that suggest he might have myositis-ILD but they would like to review her CT scans and pathology from [facility].  Apparently she had significant amount of blood work and other tests at [facility]  I do not have access to these records at this point.  She believes that she wants to undergo transplant.  She thinks that she would be able to have transplant in [place] despite a high BMI.  Currently in [place] she is somewhat stable attending pulmonary rehabilitation.  She is interested in pulmonary trials  Past  medical history  -Tested for sleep apnea at [facility] and was told to be negative.  But at [facility] in September 2019 they recommended a repeat sleep study -Denied all forms of connective tissue disease diagnosis and vasculitis.  Although at [facility] September 2019 when she was seen last week she was told she might have myositis-ILD but this is pending confirmation in the multidisciplinary case conference -She does report positive for diabetes and thyroid disease and mononucleosis in the past but denies any tuberculosis or kidney disease   Review of systems -Positive for fatigue for a few years, arthralgia for a few years but denies dysphagia.  Positive for dry eyes and mouth for few years.  No Raynaud.  No recurrent fever.  No weight loss.  No acid reflux but does admit to snoring for a few years.  No rash no ulcers.  Family history of lung disease COPD and a long but no pulmonary fibrosis.  Dad did have rheumatoid arthritis  Personal exposure history Denies tobacco, vaping, marijuana, cocaine, IV drugs  Home and hobby history  [place] which is [place] years old  Occupational history -She does stay in a condition spaces.  122 point occupational questionnaire completely negative  Medication history -She has a history of allergy to Macrobid/nitrofurantoin.  Do not know when she took it.  She is currently on immunosuppressive regimen of CellCept, Bactrim and prednisone.  CellCept since 2013 and prednisone since 2012.  She did get Rituxan x2 doses with the last one being in spring 2019.  She feels her hypoxemia improved somewhat after that.     has a past medical history of Aneurysm ([place]), Diabetes mellitus without complication ([place]), and Thyroid disease.   reports that she has never smoked. She has never used smokeless tobacco.   OV 12/21/2017  Subjective:  Patient ID: Tasha Ballard, female , DOB: 03/13/1970 , age 7 y.o. , MRN: [id number] ,  ADDRESS: [address] [place] [address]   12/21/2017 -   Chief Complaint  Patient presents with  . Follow-up    ILD     HPI ELIAH OZAWA 49 y.o. -returns for follow-up.  She brings her mother along who I am meeting for the first time.  This visit is specifically focused on evaluation from [place].  Yesterday I did speak to the interstitial lung disease specialist at [facility] Dr. Threasa Alpha.  He told me he was finally able to receive and review the surgical lung biopsy from [facility].  He and his pathologist agreed with the diagnosis of progressive NSIP.  His main recommendation was to switch patient to Cytoxan.  In addition also aggressively pursue weight loss even if it means for bariatric surgery approach so we can get ready for transplant.  He agreed the prognosis is  very poor.  At this point in time patient compared to the last visit is stable.  She is using more than 10 L of oxygen up to 15 L of oxygen.  She is interested in Cytoxan.  Dr. Elisabeth Cara indicated to me that efficacy wise oral and IV are similar but he felt IV would be a little bit safer from a urologic standpoint.  Patient is interested in weight loss.  She says she has had difficulty losing weight despite being hypocaloric.  She is interested in nutritional referral.  In addition she wants a sleep specialist referral.  She says [place] OSA was ruled out.  She has significant insomnia and daytime tiredness.  She said that [place] recommended sleep referral ASAP.  She is registered to participate in the ILD-pro registry study for progressive non-IPF disease.  This was done today.   Investigations at [facility] -November 09, 2017: Echocardiogram ejection fraction 70-75% with grade 2 diastolic dysfunction.  Right ventricle was mildly enlarged but global RV function was felt to be normal.  No pericardial effusion no obvious right-to-left shunt at rest or cough or Valsalva on agitated  saline contrast exam and stage I right ventricular diastolic dysfunction.  Pulmonary artery systolic pressure at 45 mmHg  -Lung function November 08, 2017: FVC 0.9 L / 27%, DLCO 5.86/29%  - HRCT 11/08/17 Rt diaphragm elevation ? Eventration v paresis. Sniff test recommendd  Esophagogram November 11, 2017 - > 11/11/17 Natil Jewsh - Normal    12/22/2017-telephone encounter - Dr. [name] >>>starting Cytoxan 50 mg/day >>>Avoid lots of fluids to avoid cystitis, avoid intercourse without contraception, return in 7 to 10 days to recheck CBC, Bmet, LFTs, UA  12/22/17-office visit-[place] >>>Plan in lab sleep study be ordered, patient will likely need BiPAP with oxygen  12/30/2017  - Visit   49 year old female patient presenting today for one-week follow-up visit after starting Cytoxan.  Patient also get lab work completed today.  Unfortunately patient did not get this completed prior to her office visit she will get afterwards.  Patient reports baseline symptoms of nonproductive cough.  Requiring 4 to 6 L of oxygen at rest as well as 10 to 15 L of oxygen with exertion.  Patient does have her baseline shortness of breath with exertion.  Patient reporting no worsening symptoms since starting Cytoxan.   Patient reports she typically is on 9 to 10 L of oxygen at home.   OV 01/17/2018  Subjective:  Patient ID: Tasha Ballard, female , DOB: Apr 17, 1970 , age 55 y.o. , MRN: [id number] , ADDRESS: [address] [place] [address]   01/17/2018 -   Chief Complaint  Patient presents with  . Follow-up    2 week f/u, lung transplant, was contacted and waiting on weight loss to continue   PRogressive NSIP Chronic hypoxemic resp failure Morbid Obesity - needs weight loss for transplant Rx regimen   -  cytoxan 20m per day 12/23/17 and increaed to 1082mon 12/23/17.On11/19/19. increase to 12525mer day  And switched to IV cytoxan Q4 week cycle - 1st cycle 02/03/18.   HPI ChrDEANN MCLAINE 71.o. -presents for follow-up with her husband.  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an 42m41mr day 12/23/17 and increaed to 100mg64m11/1/19.  On 01/10/18.  increase to 125mg 68mday .  Plan is to get blood  work today.  She wants a Port-A-Cath placed.  She prefers IV Cytoxan.  I checked with Dr. Threasa Alpha and he feels either p.o. or IV Cytoxan is fine but in the balance IV Cytoxan has a slightly lower incidence of hemorrhagic cystitis and the patient prefers this.  At this point in time she tells me that the Cytoxan seems to be working well for her.  She is less hypoxemic she is able to get by with 5 L at rest.  On the other hand this could be because of weight loss.  However there is some fatigue.  She will have blood work and urine analysis to look for cytotoxicity from Cytoxan.  Based on the newINBUILD  data we discussed about starting nintedanib.  I checked with Dr. Threasa Alpha at [facility] and he is supportive of the plan.  Patient states that she has done extensive research on nintedanib and she is very excited about the possibility of taking this.   Obesity: Weight 231#  11/22/17  -> 221# 01/17/2018.,  She saw the nutritionist at [facility].  She is using an app and tracking her weight on a regular basis.  She is encouraged by the weight loss.  We gave her a low glycemic diet sheet.  She has not been able to see the bariatric surgeon at [facility] surgery but I did run into Dr. Kaylyn Lim in the hallway and he said he would be happy to see her and counsel her.  Although he did acknowledge she would be high risk for surgery.  He is in favor of low carbohydrate diet.  A few weeks ago I met the transplant physician from [facility] and he brought up the idea about losing weight loss drugs.  His preferred drug is phentermine.  We discussed the  possibility about getting an endocrine consult  Lung transplant: [facility] has called her.  They want to BMI less than 30 or 36 I am not sure.  She is working towards his goal.  She has not heard from [facility].  I emailed them and the day after the visit patient is emailed me saying that she has heard from them.  She is very keen to get on the transplant list.    02/03/2018 OV for Direct Admit for IV Cytoxan  Patient presents to the office today for direct admission to [facility] for IV Cytoxan infusion.  She says her breathing is at baseline.  She remains on oxygen 4 to 6 L at rest and 10 to 15 L with activity.  He says her leg swelling is at baseline.  She remains on Lasix 40 mg daily and Aldactone 100 mg.  She is on chronic steroids with prednisone 10 mg daily. She has been on 05 x1 week.  Says she is tolerating with no nausea vomiting or diarrhea.  Does have some mild constipation.  Patient says she had a Port-A-Cath placed 1 week ago. Patient has chronic shortness of breath gets winded with minimal activity.  Has intermittent dry cough.   02/13/2018 Follow up : Progressive NSIP , O2 RF  Patient returns for a one-week follow-up.  Patient was recently admitted for IV Cytoxan under observation status.. Patient had been on oral Cytoxan.  Continue to have progressive worsening of her ILD.  At baseline she is on 4 to 6 L at rest.  And 10 to 15 L with activity.  She is on Aldactone and Lasix.  She has  been started on OFEV 3 weeks ago.  She is tolerating well with no nausea vomiting or diarrhea.  Does have some mild constipation. Since her Cytoxan infusion.  She says she has done well.  She had minimal nausea.  She does feel that her breathing has slightly improved.  Her oxygen demands are decreased.  She is using mainly only 6 L of oxygen.  And doing well with this with good O2 saturations. Says she was able to actually go out to dinner for the first time in a long  time.  She denies any hematuria, chest pain, orthopnea or increased leg swelling.  She is working on weight loss.  She is going to the weight loss center. Labs were done on 12/20 that showed normal Deas blood cell count, platelets and kidney function. Urinalysis was done today.  And is pending.. Plans are for a Cytoxan infusion in 3- 4 weeks from her last infusion.  OV 03/02/2018  Subjective:  Patient ID: Tasha Ballard, female , DOB: 22-Oct-1970 , age 20 y.o. , MRN: [id number] , ADDRESS: [address] [place] [address]   03/02/2018 -   Chief Complaint  Patient presents with  . Follow-up    Appt prior to pt's next Cytoxan infusion.  Pt states she is doing well since last visit. States she can tell that her breathing has improved. Has had some nausea/vomiting and wonders if it is from the [place].    HPI JESSELYN RASK 49 y.o. -presents for the above issues.  I personally saw her before Thanksgiving 2019.  After that she is seen by nurse practitioner under my supervision as documented above for Cytoxan infusion.  She has had post Cytoxan infusion labs which showed her [place] count is decreasing but still adequate.  Most recent labs was February 13, 2018 at the weight loss clinic with a Grudzinski count of 5900.  Rest of the labs reviewed and appear adequate and normal.  Including liver function test.  She is developed new onset of intermittent nausea and vomiting ever since starting nintedanib early December 2019.  This particularly happens with the Christmas meals.  She says this appears at random and is mild and is treated with Zofran.  Given the beneficial effects of nintedanib and progressive non-IPF ILD she is agreed to continue to take this.  She continues on her chronic prednisone and Bactrim prophylaxis.  She is finished 1 cycle of IV Cytoxan February 03, 2018.  She has upcoming second cycle 4 weeks apart on March 06, 2018.  At this point in time she is feeling well.  She feels her ILD has  actually improved.  On 2 L at rest she was saturating at 98%.  This is a huge improvement for her.  However when we walked her she still needed 8 L of oxygen with exertion.  She still says this is an improvement because in the past she was using more than 10 L for exertion.  Today my nurse practitioner will enter the orders for upcoming infusion.  There is no fever.  Morbid obesity: She has seen Dr. Leafy Ro at the weight loss clinic.  I reviewed the notes.  Dietary measures are indicated.  I have written to Dr. Leafy Ro asking about medications to accelerate weight loss given the fact her lung disease is poor prognosis and she is an urgent need for lung transplant and with weight loss being her main barrier.   Obesity: Weight 231#  11/22/17  -> 221# 01/17/2018., ->  222# on 03/02/2018    OV 03/30/2018  Subjective:  Patient ID: Tasha Ballard, female , DOB: Jul 19, 1970 , age 29 y.o. , MRN: [id number] , ADDRESS: [place] [address]   03/30/2018 -   Chief Complaint  Patient presents with  . Follow-up    PFT peformed today.  Pt states she has been doing okay since last visit.      HPI REISE HIETALA 49 y.o. -presents for follow-up of the above issues.  Her husband is here with her.  In terms of - Obesity: Weight 231#  11/22/17  -> 221# 01/17/2018., -> 222# on 03/02/2018 -> 220# on 03/30/2018  In terms of her progressive NSIP and chronic hypoxemic respiratory failure: She has finished 2 cycles of Cytoxan.  Second cycle was a full dose.  After that she got neutropenic as reviewed on the labs.  During this time she developed a facial rash.  This was on a weekend and over the phone we treated her with doxycycline.  It is largely improved.  Tomorrow she has an appointment with her dermatologist.  I do not know who it is.  Overall she is feeling stable in terms of her IPF.  Her pulmonary function test compared to [facility] shows some improvement.  In fact compared to [place] it is  even better.  She attributes this to both nintedanib and Cytoxan.  She is using 4 L of oxygen at rest and 8 L with exertion.  Her symptom score is documented below with shortness of breath at 21-22.  She always has a lot of fatigue.  In terms of her medicine tolerance particularly with nintedanib she had intermittent nausea and vomiting particularly after eating certain types of foods.  She is managing this with Zofran.  Symptom score is documented below.  Her most recent cycle of Cytoxan was March 06, 2018.  Her next Cytoxan is April 03, 2018.  I discussed with Dr. Julien Nordmann oncologist was a lot of experience with Cytoxan.  He said is okay to go with full dose if I felt so.  Patient herself is very keen on getting a full dose of Cytoxan.  She did not have neutropenic fever although she got neutropenic with the second cycle.     ROS  OV 04/26/2018  Subjective:  Patient ID: Tasha Ballard, female , DOB: [birth year] , age 55 y.o. , MRN: [id number] , ADDRESS: [address] [place] [address]   04/26/2018 -   Chief Complaint  Patient presents with  . Follow-up    Pt states she currently is not feeling too well and believes she might have sinus infection. Pt has had complaints of fatigue, has head congestion, pain in left ear going down to her neck and also has had a sore throat.       HPI KALISSA GRAYS 49 y.o. - presents for above issues. Here with ? Mom.Since last visit had blood woirk last week and wbc nadir. Had associatd "usual fatigue of low wbc". But then father in law died and she was busy so never recovered from fatigue. Has new wheeze, sinus congestion and increased cruds. Feels she has sinusitis. Wants abx before cycle #4 cytoxan next week. She is worried about COVID-19 ans asked for advice. Otherwise well. Symptom score below      OV 10/20/2018  Subjective:  Patient ID: Tasha Ballard, female , DOB: Jul 10, 1970 , age 77 y.o. , MRN: [id number] , ADDRESS: [address]  [place]  [place] [address]     10/20/2018 -   Chief Complaint  Patient presents with  . ILD (interstitial lung disease) ([place])     HPI NATELIE OSTROSKY 49 y.o. -presents for follow-up of the above issues.  She reports that over summer 2020 her husband nearly got exposed to someone with COVID this was in the outdoor setting.  She never ended up getting COVID.  On October 22, 2018 she tested negative for COVID. On October 04, 2018 she had a virtual visit with nurse practitioner for sinus complaints.  She took a 10-day course of Augmentin.  She was taking this along with Bactrim despite instructions to hold Bactrim.  This gave her immense diarrhea.  She is also reporting that the diarrhea has now improved after finishing the Augmentin course.  However she is having fatigue for the last 1 month.  She feels this because of the oral Cytoxan.  She wants to switch to IV Cytoxan.  She is in need of a flu shot and is willing to have that.  Her Port-A-Cath site is fine.  Currently she is on prednisone, oral Cytoxan, Bactrim prophylaxis and nintedanib.  She maintains herself on 6 L.  Her symptom score and oxygen needs continue to be stable.  Obesity continues to be a barrier for transplant.  In terms of her ILD her last PFT was in February 2020 and CT scan was in  Nov 2019.      OV 01/03/2019  Subjective:  Patient ID: Tasha Ballard, female , DOB: 10/31/1970 , age 89 y.o. , MRN: [id number] , ADDRESS: [address] [place] [address]    01/03/2019 -   Chief Complaint  Patient presents with  . Follow-up    Pt states she has been doing good since last visit and states breathing has been doing well. Pt is doing 6L O2 sitting and 8-10L with exertion.     HPI TAIWANA WILLISON 49 y.o. -presents for follow-up of the above medical problems.  Last visit was in end of August 2020.  At that point in time we decided to restart her IV Cytoxan because she did not want to do the oral Cytoxan in the midst of the pandemic because  this was making her fatigued and nauseated more.  She is now finished 2 cycles of IV Cytoxan both of the lower dose.  The first cycle was end of September 2020 but after that it looks like she developed some UTI symptoms although the culture did not grow anything.  We gave her antibiotics.  Therefore the second cycle was also at the lower dose.  She is willing to go up to the third cycle.  She feels her quality of life is much better with the IV Cytoxan cyclical regimen.  At this point in time she is finished 1 year of Cytoxan and also 1 year of nintedanib.  Overall she feels stable.  She uses oxygen with rest and exertion fluctuating between 6 and 8 L.  She feels a combination of the 2 has helped although she thinks maybe the nintedanib has helped her more.  She wants to have a repeat Pneumovax today.  She was asking about the Shingrix vaccine which is a live attenuated vaccine.  Her last blood draw was December 29, 2018 with Tasha Ballard and this was reviewed and is normal although the potassium is 3.5 and magnesium is 1.8 which is slightly below target.  One of her questions was about how  long to continue the Cytoxan regimen.  I will reach out to [facility] for this.  In addition she feels she is hit menopause because she has been having a lot of night sweats.  She is been started on Premarin.  She is also lost some weight.  She continues prednisone at 10 mg/day.  Her symptom scores below shows stability.  Her lung function shows stability.  CT scan shows presence of fibrosis but this no mention about progression.     OV 02/08/2019  Subjective:  Patient ID: Tasha Ballard, female , DOB: [birth year] , age 63 y.o. , MRN: [id number] , ADDRESS: [address] [place] [address]     02/08/2019 -   Chief Complaint  Patient presents with  . Follow-up    Pt has had worsening SOB and also has had increased fatigue and weakness. Pt was tested for covid and was negative.     HPI SMT LOKEY 49 y.o.  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  I last saw her in mid November 2020.  After that on January 17, 2019 she underwent Cytoxan infusion.  The particular infusion was an escalation in dose.  She says immediately after that a few hours after that infusion she started having diarrhea.  That was extremely bad.  She called into our office on January 25, 2019 and spoke to the nurse practitioner.  She was asked to hold her nintedanib and Metformin.  She was also asked to hold her Bactrim.  She was given a short course of Diflucan.  After this the diarrhea improved.  Somewhere along the way she got started on Premarin for hot flashes.  It seems she was stabilizing after that but then she had a video visit with the nurse practitioner February 05 2019, 3 days ago.  During this visit she reported that since December Leven 2020 she was desaturating much easier.  She finished a shower and started feeling she was going to blackout.  Just a few weeks ago she was able to do 14 minutes on a treadmill on 6 L before she desaturated but then on February 02, 2019 she was desaturating to 83% even on 12 L nasal cannula.  She was having sinus symptoms.  She was Covid tested and this was negative.  Nurse practitioner advised her to stop her Premarin.  She also gave her Omnicef for sinusitis.  She increased the prednisone to 20 mg for 5 days which patient is currently on.  After this the patient is supposed to go back on prednisone 10 mg/day.  She says she is now beginning to feel better.  The oxygen levels have stabilized.  But she is having extreme fatigue.  Her primary is on hold.  Symptom scores are listed below.  She is now with her husband to talk future direction in her care.  She is worried about her ability to handle Cytoxan.  She is also worried about her interstitial lung disease progressing.  She has a Port-A-Cath.   Results for Truex, NYASIA BAXLEY" (MRN [id number]) as  of 02/08/2019 09:44  Ref. Range 01/26/2019 09:32 02/01/2019 09:44  WBC Latest Ref Range: 4.0 - 10.5 K/uL 1.7 Repeated and verified X2. (LL) 2.6 (L)          OV 03/13/2019 - video visiit  Subjective:  Patient ID: Tasha Ballard, female , DOB: Oct 11, 1970 , age 49 y.o. 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 -6 days ago felt like coming down with flu. 3d prior to that husband [name] the same. They both slept a  Lot. Then approx 3-4 days ago patient got tested for covid tested and is negative. 2 days ago felt better but then fatigued again. HAving cough, mucus but not color, sore throat early AM/night. Denies GERD. Appetite poor.  Currently not on cytoxan. Not on bactrim. On ofev.  sTarting to feel better today. A week ago was more dyspneic. Now less dyspneic. Now also less fatigued.   At rest no 7-8L now 24/7 for rest and exertion. Using 40 feet hose with tank.  She is on wait for list for covid vaccine - she has registered. Says Kathlee Nations her niece -> got covid but was not exposed to her  Her most important question is that she is currently on nintedanib for her progressive ILD.  She is interested in other immunomodulator.  Back in November 2020 her ILD specialist at [facility] Dr. Elisabeth Cara indicated we should stop her Cytoxan.  Indeed with a December 2020 dose she developed cytotoxicity.  At this point in time she completed 1 year of Cytoxan.  She is very worried about her progressive ILD and wants another immunosuppression.  We considered Imuran.  In December 2020 I checked her TPMT levels and this was normal.  At this point in time I wrote to Dr. Elisabeth Cara again and he is in support of our decision to do Imuran.  He canceled this after this patient visit but on the same day.  Patient is currently having respiratory viral infection but is recovering from  it.   CT CHest High Resolution 11/20/2018  IMPRESSION: 1. The appearance of the lungs is considered probable usual interstitial pneumonia (UIP) per current ATS guidelines. Repeat high-resolution chest CT is recommended in 12 months to assess for temporal changes in the appearance of the lung parenchyma. 2. Aortic atherosclerosis.  Aortic Atherosclerosis (ICD10-I70.0).   Electronically Signed   By: Vinnie Langton M.D.   On: 11/20/2018 14:16  ROS - per HPI  OV 05/08/2019  Subjective:  Patient ID: Tasha Ballard, female , DOB: 12-Sep-1970 , age 15 y.o. , MRN: [id number] , ADDRESS: [address] [place] [address]   05/08/2019 -   Chief Complaint  Patient presents with  . Follow-up    Pt had CT performed 3/5 and is here to have results discussed. Pt states breathing is about the same.  #Morbid Obesity - needs weight loss for transplant - sees Dr Janeal Holmes since dec 2019 #PRogressive NSIP #Chronic hypoxemic resp failure - 4L at rest, 8L with exertion in dec 2019 - dec 2020 6 to 8/12L # High risk treatment regimen  -CellCept 2013 through 2019  -Chronic prednisone 2013 -currently taking -Bactrim prophylaxis 2013 --currently taking  - Cytoxan Oral  100m per day 12/23/17 and increaed to 1024mon 12/23/17.On11/19/19. increase to 12569mer day   - Cytoxan IV : And switched to IV cytoxan Q4 week cycle   - 1st cycle 02/03/18 (s/p portocath 01/26/18)  = 2nd cycle 03/06/2018  - 3rd cycle  -  04/03/2018  - 4th cycle - 05/01/2018 - Cytoxan oral (due to pandemic covid-19 and need to avoid infusion visits) - spring 2020 - BAck on IV Cytoxan -   - 5th cycle - end sept 2020  - 6th cycle -  end oct 2020  - 7th  Cycle - end nov 2020 (full dose) 01/17/2019   - Ofev 01/23/18 (approximnate)  - Immuran mid to end Jan 2021  - Lung transplant needed: Obesity Barrier :  Weight 231#  11/22/17  -> 221# 01/17/2018., -> 222# on 03/02/2018 -> 220# on 03/30/2018 -> 223# on 04/26/2018 -> 224# on  10/20/2018 -> 219# on 01/03/2019 -> 222#   HPI AMAMDA CURBOW 49 y.o. - presents for followup. Continues immuran, pred, ofev and o2. Uses 6L at rest and 8-12 with exertin. Had followup CT And personally visualzed and interpreted. Fibrosis is same but there is a 1cm deep recess LLL nodule. She is very concerned about t his. Explained biopsy is very risky. She prefers as much workup as safely possible. REgarding fibrosis she is worried about decline. Discussed alternatives to immuran because she feels is not working as well as cytoxan IV. We discussed the add-on of esbriet to regimen. She is interested in this concept . She says she tolerates ofev well      SYMPTOM SCALE - ILD 03/30/2018  04/26/2018  10/20/2018  01/03/2019  02/08/2019  05/08/2019   O2 use  4 L at rest, 8 L with exertion Same but prefers 6L at all times 6L  6L at rest, 8-10 L with exertion 6L rest -> 93% 6L rest, 8-12 with ex  Shortness of Breath 0 -> 5 scale with 5 being worst (score 6 If unable to do)       At rest 0 1 0 0 0   Simple tasks - showers, clothes change, eating, shaving 1 3.5 3.5 3.5 2   Household (dishes, doing bed, laundry) 2.5 4.5 3._0 Shopping 3 4.5 3._1 Walking level at own pace 3 3.5 3.5 3.5 4   Walking keeping up with others of same age _2 4.5 5   Walking up Stairs 4 5 4._3 Walking up Hill 4 5 4._4 Total (40 - 48) Dyspnea Score 21.5 32 28 29.5 29   How bad is your cough? x 2.5 x mild worse 3  How bad is your fatigue 5 ("very, lol" 4 Extreme 5+ "lot" awful 4  nausea      2  vomit      0  diarrhea      4  anxiety      3.5  depression      3.5   Results for Girdler, GARLENE APPERSON" (MRN [id number]) as of 01/03/2019 10:52  Ref. Range 03/30/2018 11:50 11/20/2018 15:14  FVC-Pre Latest Units: L 1.09 1.16  FVC-%Pred-Pre Latest Units: % 32 35  Results for Huwe, Evynn Y "[name]" (MRN [id number]) as of 01/03/2019 10:52  Ref. Range 03/30/2018 11:50 11/20/2018 15:14  DLCO unc Latest  Units: ml/min/mmHg 6.85 7.29  DLCO unc % pred Latest Units: % 34 37   ROS - per HPI  IMPRESSION:  1. New 1.0 cm solid pulmonary nodule in the medial basilar left lower lobe. Consider one of the following in 3 months for both low-risk and high-risk individuals: (a) repeat chest CT, (b) follow-up PET-CT, or (c) tissue sampling. This recommendation follows the consensus statement: Guidelines for Management of Incidental Pulmonary Nodules Detected on CT Images: From the Fleischner Society 2017; Radiology 2017; 284:228-243. 2. Spectrum of findings compatible with fibrotic interstitial lung disease with mild basilar predominance and mild honeycombing, compatible with usual interstitial pneumonia (UIP).  No convincing interval progression since 11/20/2018. Findings are consistent with UIP per consensus guidelines: Diagnosis of Idiopathic Pulmonary Fibrosis: An Official ATS/ERS/JRS/ALAT Clinical Practice Guideline. Kirkpatrick, Iss 5, [phone], Oct 23 2016. 3. Aortic Atherosclerosis (ICD10-I70.0).  These results will be called to the ordering clinician or representative by the Radiologist Assistant, and communication documented in the PACS or zVision Dashboard.   Electronically Signed   By: Ilona Sorrel M.D.   On: 04/27/2019 11:22    has a past medical history of Aneurysm ([place]), Back pain, Diabetes mellitus without complication ([place]), H/O blood clots, Hypertension, Joint pain, Pulmonary fibrosis (HCC), SOB (shortness of breath), Swelling, Thyroid disease, and Vitamin D deficiency.   reports that she has never smoked. She has never used smokeless tobacco.  Past Surgical History:  Procedure Laterality Date  . ABLATION    . IR IMAGING GUIDED PORT INSERTION  01/26/2018  . KNEE SURGERY    . LUNG BIOPSY      Allergies  Allergen Reactions  . Macrobid [Nitrofurantoin Macrocrystal] Nausea And Vomiting  . Adhesive [Tape] Rash    Immunization History   Administered Date(s) Administered  . Influenza,inj,Quad PF,6+ Mos 12/24/2014, 02/05/2016, 01/07/2017, 11/22/2017, 10/20/2018  . Influenza-Unspecified 12/24/2014, 02/05/2016, 01/07/2017, 11/22/2017  . PFIZER SARS-COV-2 Vaccination 04/06/2019, 04/30/2019  . Pneumococcal Conjugate-13 07/20/2017  . Pneumococcal Polysaccharide-23 06/09/2012, 01/03/2019  . Tdap 02/19/2011    Family History  Problem Relation Age of Onset  . Heart failure Mother   . Heart disease Mother   . Rheumatologic disease Father      Current Outpatient Medications:  .  azaTHIOprine (IMURAN) 50 MG tablet, Take 1 tablet (50 mg) by mouth daily for 2 weeks then increase to 2 tablets (14m) daily., Disp: 180 tablet, Rfl: 0 .  benzonatate (TESSALON) 200 MG capsule, Take 1 capsule (200 mg total) by mouth 3 (three) times daily as needed for cough., Disp: 30 capsule, Rfl: 1 .  carvedilol (COREG) 6.25 MG tablet, Take 6.25 mg by mouth 2 (two) times daily with a meal., Disp: , Rfl:  .  furosemide (LASIX) 40 MG tablet, Take 40 mg by mouth See admin instructions. Take 1 tablet (40 mg) daily, may repeat dose if needed for fluid retention., Disp: , Rfl:  .  gabapentin (NEURONTIN) 100 MG capsule, Take 1 capsule by mouth as needed., Disp: , Rfl:  .  magnesium oxide (MAG-OX) 400 MG tablet, Take 1 tablet (400 mg total) by mouth daily., Disp: 30 tablet, Rfl: 5 .  metFORMIN (GLUCOPHAGE) 500 MG tablet, Take 500 mg by mouth 2 (two) times daily. , Disp: , Rfl:  .  montelukast (SINGULAIR) 10 MG tablet, Take 10 mg by mouth daily after supper., Disp: , Rfl:  .  OFEV 150 MG CAPS, Take 150 mg by mouth 2 (two) times daily., Disp: , Rfl: 11 .  ondansetron (ZOFRAN) 8 MG tablet, TAKE 1 TABLET BY MOUTH EVERY 8 HOURS AS NEEDED FOR NAUSEA AND VOMITING, Disp: 45 tablet, Rfl: 2 .  potassium chloride (KLOR-CON) 10 MEQ tablet, TAKE 1 TABLET BY MOUTH 2 TIMES A DAY, Disp: 120 tablet, Rfl: 2 .  predniSONE (DELTASONE) 10 MG tablet, Take 10-284mas needed for SOB,  Disp: 30 tablet, Rfl: 3 .  PROAIR HFA 108 (90 Base) MCG/ACT inhaler, Inhale 2 puffs into the lungs every 6 (six) hours as needed for wheezing or shortness of breath., Disp: 18 g, Rfl: 5 .  saccharomyces boulardii (FLORASTOR) 250 MG capsule, Take 1 capsule (250  mg total) by mouth 2 (two) times daily., Disp: 60 capsule, Rfl: 0 .  spironolactone (ALDACTONE) 100 MG tablet, Take 100 mg by mouth daily., Disp: , Rfl:  .  sulfamethoxazole-trimethoprim (BACTRIM) 400-80 MG tablet, Take 1 tablet by mouth every Monday, Wednesday, and Friday., Disp: , Rfl:  .  thyroid (ARMOUR) 90 MG tablet, Take 90 mg by mouth daily before breakfast. , Disp: , Rfl:  .  Vitamin D, Ergocalciferol, (DRISDOL) 1.25 MG (50000 UT) CAPS capsule, TAKE 1 CAPSULE BY MOUTH ONCE WEEKLY, Disp: 3 capsule, Rfl: 0 .  clindamycin (CLEOCIN T) 1 % lotion, , Disp: , Rfl:       Objective:   Vitals:   05/08/19 1124  BP: 110/70  Pulse: 99  SpO2: 97%  Weight: 220 lb 9.6 oz (100.1 kg)  Height: _0  (1.575 m)    Estimated body mass index is 40.35 kg/m as calculated from the following:   Height as of this encounter: _1  (1.575 m).   Weight as of this encounter: 220 lb 9.6 oz (100.1 kg).  _2 @  Filed Weights   05/08/19 1124  Weight: 220 lb 9.6 oz (100.1 kg)     Physical Exam Obese, Portocath +, Crackles +. Plesant         Assessment:       ICD-10-CM   1. NSIP (nonspecific interstitial pneumonia) ([place])  J84.89 ECHOCARDIOGRAM COMPLETE    Potassium    Potassium    Urinalysis  2. Chronic respiratory failure with hypoxia (HCC)  J96.11   3. High risk medication use  Z79.899 ECHOCARDIOGRAM COMPLETE    Potassium    Potassium    Urinalysis  4. Therapeutic drug monitoring  Z51.81 Potassium    Potassium    Urinalysis  5. Nodule of lower lobe of left lung  R91.1   6. Abnormal findings on diagnostic imaging of lung  R91.8 NM PET Image Initial (PI) Skull Base To Thigh    ECHOCARDIOGRAM COMPLETE  7. ILD (interstitial  lung disease) ([place])  J84.9 Phosphorus    Hepatic function panel    Urinalysis, Routine w reflex microscopic    Magnesium    Basic metabolic panel    CBC with Differential/Platelet       Plan:     Patient Instructions  NSIP (nonspecific interstitial pneumonia) (HCC) Chronic respiratory failure with hypoxia (HCC) High risk medication use Therapeutic drug monitoring  - similar level of severe pulmonary fibrosis compared to last time  Plan  - check cbc, bmet, mag,  Phos, lft 05/08/2019  - continue o2  - cotninue ofev as before - glad you are tolerating it - check on PIRFENEX from [place] but will  apply for add-on esbriet in [place]  - continue immuran, bactrim, prednisone as before - do echo (last one was sept 2019)  Nodule of lower lobe of left lung - new march 2021  Plan  - check PET scan - check BIODESIK blood work   Followup - 3-4 weeks video visit with me or [name] PArrett - 30 min slot   ( Level 05 visit: estb 40-54 min in face to face visit  in total care time and counseling or/acoordination of care by this undersigned MD - Dr Brand Males. This includes one or more of the following on this same day 05/08/2019: pre-charting, chart review, note writing, documentation discussion of test results, diagnostic or treatment recommendations, prognosis, risks and benefits of management options, instructions, education, compliance or risk-factor reduction. It excludes time spent by  the CMA or office staff in the care of the patient. Actual time 76 min)   SIGNATURE    Dr. Brand Males, M.D., F.C.C.P,  Pulmonary and Critical Care Medicine Staff Physician, [place] Director - Interstitial Lung Disease  Program  P[facility] [place] [facility] [place], [place], [address]  Pager: [phone], If no answer or between  15:00h - 7:00h: call [phone] Telephone: [phone]  8:32 PM 05/08/2019

## 2019-05-08 NOTE — Telephone Encounter (Signed)
Tasha Ballard and [employer]:  Given this patients aggressive fibrosis that is progressive I want to add Esbriet to her Ofev. She has progressive ILD. Can yhou initiate paper work for me to sign and proceed please. Might need to do appeal letter  Thanks     Tasha Ballard: regarding labs Wbc on higher side thatn before Na is usual low normal Otherwise labs ok  Plan  - rechek cbc, bmet, lft in 2 weeks   LABS    PULMONARY No results for input(s): PHART, PCO2ART, PO2ART, HCO3, TCO2, O2SAT in the last 168 hours.  Invalid input(s): PCO2, PO2  CBC Recent Labs  Lab 05/08/19 1225  HGB 13.9  HCT 42.9  WBC 12.4*  PLT 281.0    COAGULATION No results for input(s): INR in the last 168 hours.  CARDIAC  No results for input(s): TROPONINI in the last 168 hours. No results for input(s): PROBNP in the last 168 hours.   CHEMISTRY Recent Labs  Lab 05/08/19 1225  NA 132*  K 3.6  3.6  CL 86*  CO2 34*  GLUCOSE 147*  BUN 15  CREATININE 1.03  CALCIUM 10.1  MG 2.0  PHOS 4.0   Estimated Creatinine Clearance: 72.3 mL/min (by C-G formula based on SCr of 1.03 mg/dL).   LIVER Recent Labs  Lab 05/08/19 1225  AST 18  ALT 14  ALKPHOS 54  BILITOT 0.4  PROT 7.3  ALBUMIN 4.2     INFECTIOUS No results for input(s): LATICACIDVEN, PROCALCITON in the last 168 hours.   ENDOCRINE CBG (last 3)  No results for input(s): GLUCAP in the last 72 hours.       IMAGING x48h  - image(s) personally visualized  -   highlighted in bold No results found.

## 2019-05-08 NOTE — Patient Instructions (Addendum)
NSIP (nonspecific interstitial pneumonia) (HCC) Chronic respiratory failure with hypoxia (HCC) High risk medication use Therapeutic drug monitoring  - similar level of severe pulmonary fibrosis compared to last time  Plan  - check cbc, bmet, mag,  Phos, lft 05/08/2019  - continue o2  - cotninue ofev as before - glad you are tolerating it - check on PIRFENEX from [place] but will  apply for add-on esbriet in [place]  - continue immuran, bactrim, prednisone as before - do echo (last one was sept 2019)  Nodule of lower lobe of left lung - new march 2021  Plan  - check PET scan - check BIODESIK blood work   Followup - 3-4 weeks video visit with me or [name] PArrett - 30 min slot

## 2019-05-09 NOTE — Addendum Note (Signed)
Addended by: Suzzanne Cloud E on: 05/09/2019 09:25 AM   Modules accepted: Orders

## 2019-05-09 NOTE — Telephone Encounter (Signed)
Submitted a Prior Authorization request to [employer] for [facility] via Cover My Meds. Will update once we receive a response.  (KeyHulen Skains)  4:14 PM Beatriz Chancellor, CPhT

## 2019-05-10 ENCOUNTER — Other Ambulatory Visit: Payer: Self-pay

## 2019-05-10 ENCOUNTER — Encounter (HOSPITAL_COMMUNITY)
Admission: RE | Admit: 2019-05-10 | Discharge: 2019-05-10 | Disposition: A | Discharge status: Home / Self Care | Payer: BC Managed Care – PPO | Source: Ambulatory Visit | Attending: Internal Medicine | Admitting: Internal Medicine

## 2019-05-10 DIAGNOSIS — J84112 Idiopathic pulmonary fibrosis: Secondary | ICD-10-CM | POA: Diagnosis not present

## 2019-05-10 DIAGNOSIS — J849 Interstitial pulmonary disease, unspecified: Secondary | ICD-10-CM

## 2019-05-10 NOTE — Progress Notes (Signed)
Daily Session Note  Patient Details  Name: Tasha Ballard MRN: [id number] Date of Birth: 03-21-1970 Referring Provider:     Pulmonary Rehab Walk Test from 01/11/2019 in [place]  Referring Provider  Dr. [name]      Encounter Date: 05/10/2019  Check In: Session Check In - 05/10/19 1127      Check-In   Supervising physician immediately available to respond to emergencies  [facility] immediately available    Physician(s)  Dr. Darrick Meigs    Location  [facility]    Staff Present  Rosebud Poles, RN, Bjorn Loser, MS, Exercise Physiologist;[name], RN    Virtual Visit  No    Medication changes reported      No    Fall or balance concerns reported     No    Tobacco Cessation  No Change    Warm-up and Cool-down  Performed as group-led instruction    Resistance Training Performed  Yes    VAD Patient?  No    PAD/SET Patient?  No      Pain Assessment   Currently in Pain?  No/denies    Multiple Pain Sites  No       Capillary Blood Glucose: No results found for this or any previous visit (from the past 24 hour(s)).    Social History   Tobacco Use  Smoking Status Never Smoker  Smokeless Tobacco Never Used    Goals Met:  Exercise tolerated well No report of cardiac concerns or symptoms Strength training completed today  Goals Unmet:  Not Applicable  Comments: Service time is from 1040 to 1130    Dr. Fransico Him is Medical Director for Cardiac Rehab at [facility].

## 2019-05-11 NOTE — Telephone Encounter (Signed)
Ok plese keep me updated

## 2019-05-11 NOTE — Telephone Encounter (Signed)
Tasha Ballard from [facility] called back, needs more information and confirmation of diagnosis. Please call to clarify:  [phone] reference number:[id number]

## 2019-05-11 NOTE — Telephone Encounter (Signed)
Returned cal to [facility] to confirm diagnosis. PA is now being sent to pharmacist for review. Will update once we receive a response.  Phone# [phone]  10:47 AM Beatriz Chancellor, CPhT

## 2019-05-14 ENCOUNTER — Other Ambulatory Visit: Payer: Self-pay

## 2019-05-14 ENCOUNTER — Ambulatory Visit (HOSPITAL_COMMUNITY)
Admission: RE | Admit: 2019-05-14 | Discharge: 2019-05-14 | Disposition: A | Discharge status: Home / Self Care | Payer: BC Managed Care – PPO | Source: Ambulatory Visit | Attending: Internal Medicine | Admitting: Internal Medicine

## 2019-05-14 DIAGNOSIS — R918 Other nonspecific abnormal finding of lung field: Secondary | ICD-10-CM

## 2019-05-14 DIAGNOSIS — J8489 Other specified interstitial pulmonary diseases: Secondary | ICD-10-CM | POA: Diagnosis present

## 2019-05-14 DIAGNOSIS — E119 Type 2 diabetes mellitus without complications: Secondary | ICD-10-CM | POA: Insufficient documentation

## 2019-05-14 DIAGNOSIS — Z79899 Other long term (current) drug therapy: Secondary | ICD-10-CM

## 2019-05-14 DIAGNOSIS — I10 Essential (primary) hypertension: Secondary | ICD-10-CM | POA: Insufficient documentation

## 2019-05-14 DIAGNOSIS — R0600 Dyspnea, unspecified: Secondary | ICD-10-CM | POA: Insufficient documentation

## 2019-05-14 NOTE — Telephone Encounter (Signed)
This is great. Thanks for strong work. Pleast note she will be taking esbriet in addition to ofev. Needs q2 week pharmacy/MD/APP tele visit and monthly labs for a while

## 2019-05-14 NOTE — Telephone Encounter (Signed)
Received notification from [facility] regarding a prior authorization for ESBRIET. Authorization has been APPROVED from 05/09/19 to 05/07/20.   Authorization # [id number]  Ran test claim, patient's copay is $0.00.  8:23 AM Beatriz Chancellor, CPhT

## 2019-05-14 NOTE — Progress Notes (Signed)
  Echocardiogram 2D Echocardiogram has been performed.  Jennette Dubin 05/14/2019, 12:08 PM

## 2019-05-15 ENCOUNTER — Other Ambulatory Visit (INDEPENDENT_AMBULATORY_CARE_PROVIDER_SITE_OTHER): Payer: BC Managed Care – PPO

## 2019-05-15 ENCOUNTER — Encounter (HOSPITAL_COMMUNITY)
Admission: RE | Admit: 2019-05-15 | Discharge: 2019-05-15 | Disposition: A | Discharge status: Home / Self Care | Payer: BC Managed Care – PPO | Source: Ambulatory Visit | Attending: Internal Medicine | Admitting: Internal Medicine

## 2019-05-15 ENCOUNTER — Other Ambulatory Visit: Payer: Self-pay | Admitting: *Deleted

## 2019-05-15 VITALS — Wt 222.4 lb

## 2019-05-15 DIAGNOSIS — Z79899 Other long term (current) drug therapy: Secondary | ICD-10-CM

## 2019-05-15 DIAGNOSIS — J849 Interstitial pulmonary disease, unspecified: Secondary | ICD-10-CM

## 2019-05-15 DIAGNOSIS — J84112 Idiopathic pulmonary fibrosis: Secondary | ICD-10-CM | POA: Diagnosis not present

## 2019-05-15 DIAGNOSIS — J8489 Other specified interstitial pulmonary diseases: Secondary | ICD-10-CM | POA: Diagnosis not present

## 2019-05-15 LAB — URINALYSIS
Hgb urine dipstick: NEGATIVE
Ketones, ur: NEGATIVE
Leukocytes,Ua: NEGATIVE
Nitrite: NEGATIVE
Specific Gravity, Urine: >=1.03 — AB (ref 1.000–1.030)
Total Protein, Urine: NEGATIVE
Urine Glucose: NEGATIVE
Urobilinogen, UA: 0.2 (ref 0.0–1.0)
pH: 5.5 (ref 5.0–8.0)

## 2019-05-15 NOTE — Progress Notes (Signed)
Pulmonary Individual Treatment Plan  Patient Details  Name: JIANNA DRABIK MRN: [id number] Date of Birth: 1971-02-22 Referring Provider:     Pulmonary Rehab Walk Test from 01/11/2019 in [place]  Referring Provider  Dr. [name]      Initial Encounter Date:    Pulmonary Rehab Walk Test from 01/11/2019 in [place]  Date  01/11/19      Visit Diagnosis: Interstitial lung disease ([place])  Patient's Home Medications on Admission:   Current Outpatient Medications:  .  azaTHIOprine (IMURAN) 50 MG tablet, Take 1 tablet (50 mg) by mouth daily for 2 weeks then increase to 2 tablets (157m) daily., Disp: 180 tablet, Rfl: 0 .  benzonatate (TESSALON) 200 MG capsule, Take 1 capsule (200 mg total) by mouth 3 (three) times daily as needed for cough., Disp: 30 capsule, Rfl: 1 .  carvedilol (COREG) 6.25 MG tablet, Take 6.25 mg by mouth 2 (two) times daily with a meal., Disp: , Rfl:  .  clindamycin (CLEOCIN T) 1 % lotion, , Disp: , Rfl:  .  furosemide (LASIX) 40 MG tablet, Take 40 mg by mouth See admin instructions. Take 1 tablet (40 mg) daily, may repeat dose if needed for fluid retention., Disp: , Rfl:  .  gabapentin (NEURONTIN) 100 MG capsule, Take 1 capsule by mouth as needed., Disp: , Rfl:  .  magnesium oxide (MAG-OX) 400 MG tablet, Take 1 tablet (400 mg total) by mouth daily., Disp: 30 tablet, Rfl: 5 .  metFORMIN (GLUCOPHAGE) 500 MG tablet, Take 500 mg by mouth 2 (two) times daily. , Disp: , Rfl:  .  montelukast (SINGULAIR) 10 MG tablet, Take 10 mg by mouth daily after supper., Disp: , Rfl:  .  OFEV 150 MG CAPS, Take 150 mg by mouth 2 (two) times daily., Disp: , Rfl: 11 .  ondansetron (ZOFRAN) 8 MG tablet, TAKE 1 TABLET BY MOUTH EVERY 8 HOURS AS NEEDED FOR NAUSEA AND VOMITING, Disp: 45 tablet, Rfl: 2 .  potassium chloride (KLOR-CON) 10 MEQ tablet, TAKE 1 TABLET BY MOUTH 2 TIMES A DAY, Disp: 120 tablet, Rfl: 2 .  predniSONE  (DELTASONE) 10 MG tablet, Take 10-272mas needed for SOB, Disp: 30 tablet, Rfl: 3 .  PROAIR HFA 108 (90 Base) MCG/ACT inhaler, Inhale 2 puffs into the lungs every 6 (six) hours as needed for wheezing or shortness of breath., Disp: 18 g, Rfl: 5 .  saccharomyces boulardii (FLORASTOR) 250 MG capsule, Take 1 capsule (250 mg total) by mouth 2 (two) times daily., Disp: 60 capsule, Rfl: 0 .  spironolactone (ALDACTONE) 100 MG tablet, Take 100 mg by mouth daily., Disp: , Rfl:  .  sulfamethoxazole-trimethoprim (BACTRIM) 400-80 MG tablet, Take 1 tablet by mouth every Monday, Wednesday, and Friday., Disp: , Rfl:  .  thyroid (ARMOUR) 90 MG tablet, Take 90 mg by mouth daily before breakfast. , Disp: , Rfl:  .  Vitamin D, Ergocalciferol, (DRISDOL) 1.25 MG (50000 UT) CAPS capsule, TAKE 1 CAPSULE BY MOUTH ONCE WEEKLY, Disp: 3 capsule, Rfl: 0  Past Medical History: Past Medical History:  Diagnosis Date  . [place] ([place]  . Back pain   . Diabetes mellitus without complication ([facility]  . H/O blood clots   . Hypertension   . Joint pain   . Pulmonary fibrosis ([place]  . SOB (shortness of breath)   . Swelling   . Thyroid disease   . Vitamin D deficiency  Tobacco Use: Social History   Tobacco Use  Smoking Status Never Smoker  Smokeless Tobacco Never Used    Labs: Recent Review Scientist, physiological    Labs for ITP Cardiac and Pulmonary Rehab Latest Ref Rng & Units 02/13/2018   Cholestrol 100 - 199 mg/dL 295(H)   LDLCALC 0 - 99 mg/dL 186(H)   HDL >39 mg/dL 85   Trlycerides 0 - 149 mg/dL 120   Hemoglobin A1c 4.8 - 5.6 % 5.6      Capillary Blood Glucose: Lab Results  Component Value Date   GLUCAP 115 (H) 01/26/2018   GLUCAP 162 (H) 09/01/2017   POCT Glucose    Row Name 01/23/19 1139 02/06/19 1138 04/17/19 1137 05/01/19 1116       POCT Blood Glucose   Pre-Exercise  111 mg/dL  125 mg/dL  122 mg/dL  132 mg/dL    Post-Exercise  123 mg/dL  111 mg/dL  128 mg/dL  129 mg/dL       Pulmonary  Assessment Scores: Pulmonary Assessment Scores    Row Name 01/11/19 1555         ADL UCSD   ADL Phase  Entry     SOB Score total  64       CAT Score   CAT Score  25       UCSD: Self-administered rating of dyspnea associated with activities of daily living (ADLs) 6-point scale (0 = "not at all" to 5 = "maximal or unable to do because of breathlessness")  Scoring Scores range from 0 to 120.  Minimally important difference is 5 units  CAT: CAT can identify the health impairment of COPD patients and is better correlated with disease progression.  CAT has a scoring range of zero to 40. The CAT score is classified into four groups of low (less than 10), medium (10 - 20), high (21-30) and very high (31-40) based on the impact level of disease on health status. A CAT score over 10 suggests significant symptoms.  A worsening CAT score could be explained by an exacerbation, poor medication adherence, poor inhaler technique, or progression of COPD or comorbid conditions.  CAT MCID is 2 points  mMRC: mMRC (Modified Medical Research Council) Dyspnea Scale is used to assess the degree of baseline functional disability in patients of respiratory disease due to dyspnea. No minimal important difference is established. A decrease in score of 1 point or greater is considered a positive change.   Pulmonary Function Assessment: Pulmonary Function Assessment - 01/11/19 1504      Breath   Bilateral Breath Sounds  Clear    Shortness of Breath  Yes;Fear of Shortness of Breath;Limiting activity       Exercise Target Goals: Exercise Program Goal: Individual exercise prescription set using results from initial 6 min walk test and THRR while considering  patient's activity barriers and safety.   Exercise Prescription Goal: Initial exercise prescription builds to 30-45 minutes a day of aerobic activity, 2-3 days per week.  Home exercise guidelines will be given to patient during program as part of  exercise prescription that the participant will acknowledge.  Activity Barriers & Risk Stratification: Activity Barriers & Cardiac Risk Stratification - 01/11/19 1501      Activity Barriers & Cardiac Risk Stratification   Activity Barriers  Deconditioning;Muscular Weakness;Shortness of Breath       6 Minute Walk: 6 Minute Walk    Row Name 01/11/19 1620         6 Minute Walk  Phase  Initial     Distance  915 feet     Walk Time  5.58 minutes     # of Rest Breaks  1 standing rest 25 sec due to desaturation     MPH  1.73     METS  3.12     RPE  13     Perceived Dyspnea   3     VO2 Peak  10.92     Symptoms  No     Resting HR  99 bpm     Resting BP  112/74     Resting Oxygen Saturation   99 %     Exercise Oxygen Saturation  during 6 min walk  84 %     Max Ex. HR  159 bpm     Max Ex. BP  154/72     2 Minute Post BP  126/80       Interval HR   1 Minute HR  159     2 Minute HR  134     3 Minute HR  135     4 Minute HR  123     5 Minute HR  125     6 Minute HR  131     2 Minute Post HR  111     Interval Heart Rate?  Yes       Interval Oxygen   Interval Oxygen?  Yes     Baseline Oxygen Saturation %  99 %     1 Minute Oxygen Saturation %  92 %     1 Minute Liters of Oxygen  6 L     2 Minute Oxygen Saturation %  87 %     2 Minute Liters of Oxygen  6 L     3 Minute Oxygen Saturation %  84 %     3 Minute Liters of Oxygen  6 L     4 Minute Oxygen Saturation %  91 %     4 Minute Liters of Oxygen  8 L     5 Minute Oxygen Saturation %  88 %     5 Minute Liters of Oxygen  8 L     6 Minute Oxygen Saturation %  86 %     6 Minute Liters of Oxygen  8 L     2 Minute Post Oxygen Saturation %  96 %     2 Minute Post Liters of Oxygen  6 L        Oxygen Initial Assessment: Oxygen Initial Assessment - 01/11/19 1619      Home Oxygen   Home Oxygen Device  E-Tanks;Home Concentrator    Sleep Oxygen Prescription  Continuous    Liters per minute  7    Home Exercise Oxygen  Prescription  Continuous    Liters per minute  10    Home at Rest Exercise Oxygen Prescription  Continuous    Liters per minute  2    Compliance with Home Oxygen Use  Yes      Initial 6 min Walk   Oxygen Used  Continuous;E-Tanks    Liters per minute  8      Program Oxygen Prescription   Program Oxygen Prescription  Continuous;E-Tanks    Liters per minute  10      Intervention   Short Term Goals  To learn and exhibit compliance with exercise, home and travel O2 prescription;To learn and understand importance of monitoring SPO2 with  pulse oximeter and demonstrate accurate use of the pulse oximeter.;To learn and understand importance of maintaining oxygen saturations>88%;To learn and demonstrate proper pursed lip breathing techniques or other breathing techniques.;To learn and demonstrate proper use of respiratory medications    Long  Term Goals  Exhibits compliance with exercise, home and travel O2 prescription;Verbalizes importance of monitoring SPO2 with pulse oximeter and return demonstration;Maintenance of O2 saturations>88%;Exhibits proper breathing techniques, such as pursed lip breathing or other method taught during program session;Compliance with respiratory medication;Demonstrates proper use of MDI's       Oxygen Re-Evaluation: Oxygen Re-Evaluation    Row Name 02/05/19 1121 04/10/19 0904 05/15/19 0714         Program Oxygen Prescription   Program Oxygen Prescription  Continuous;E-Tanks  Continuous;E-Tanks  Continuous;E-Tanks     Liters per minute  _0 Comments  10 L on TM and 8 L on stepper  10 L on TM and 8 L on stepper  10 L on TM and 8 L on stepper       Home Oxygen   Home Oxygen Device  E-Tanks;Home Concentrator  E-Tanks;Home Concentrator  E-Tanks;Home Concentrator     Sleep Oxygen Prescription  Continuous  Continuous  Continuous     Liters per minute  _1 Home Exercise Oxygen Prescription  Continuous  Continuous  Continuous     Liters per minute   _2 Home at Rest Exercise Oxygen Prescription  Continuous  Continuous  Continuous     Liters per minute  _3 Compliance with Home Oxygen Use  Yes  Yes  Yes       Goals/Expected Outcomes   Short Term Goals  To learn and exhibit compliance with exercise, home and travel O2 prescription;To learn and understand importance of monitoring SPO2 with pulse oximeter and demonstrate accurate use of the pulse oximeter.;To learn and understand importance of maintaining oxygen saturations>88%;To learn and demonstrate proper pursed lip breathing techniques or other breathing techniques.;To learn and demonstrate proper use of respiratory medications  To learn and exhibit compliance with exercise, home and travel O2 prescription;To learn and understand importance of monitoring SPO2 with pulse oximeter and demonstrate accurate use of the pulse oximeter.;To learn and understand importance of maintaining oxygen saturations>88%;To learn and demonstrate proper pursed lip breathing techniques or other breathing techniques.;To learn and demonstrate proper use of respiratory medications  To learn and exhibit compliance with exercise, home and travel O2 prescription;To learn and understand importance of monitoring SPO2 with pulse oximeter and demonstrate accurate use of the pulse oximeter.;To learn and understand importance of maintaining oxygen saturations>88%;To learn and demonstrate proper pursed lip breathing techniques or other breathing techniques.;To learn and demonstrate proper use of respiratory medications     Long  Term Goals  Exhibits compliance with exercise, home and travel O2 prescription;Verbalizes importance of monitoring SPO2 with pulse oximeter and return demonstration;Maintenance of O2 saturations>88%;Exhibits proper breathing techniques, such as pursed lip breathing or other method taught during program session;Compliance with respiratory medication;Demonstrates proper use of MDI's  Exhibits  compliance with exercise, home and travel O2 prescription;Verbalizes importance of monitoring SPO2 with pulse oximeter and return demonstration;Maintenance of O2 saturations>88%;Exhibits proper breathing techniques, such as pursed lip breathing or other method taught during program session;Compliance with respiratory medication;Demonstrates proper use of MDI's  Exhibits compliance with exercise, home and travel O2 prescription;Verbalizes  importance of monitoring SPO2 with pulse oximeter and return demonstration;Maintenance of O2 saturations>88%;Exhibits proper breathing techniques, such as pursed lip breathing or other method taught during program session;Compliance with respiratory medication;Demonstrates proper use of MDI's     Goals/Expected Outcomes  Compliance  Compliance  Compliance        Oxygen Discharge (Final Oxygen Re-Evaluation): Oxygen Re-Evaluation - 05/15/19 0714      Program Oxygen Prescription   Program Oxygen Prescription  Continuous;E-Tanks    Liters per minute  10    Comments  10 L on TM and 8 L on stepper      Home Oxygen   Home Oxygen Device  E-Tanks;Home Concentrator    Sleep Oxygen Prescription  Continuous    Liters per minute  7    Home Exercise Oxygen Prescription  Continuous    Liters per minute  10    Home at Rest Exercise Oxygen Prescription  Continuous    Liters per minute  2    Compliance with Home Oxygen Use  Yes      Goals/Expected Outcomes   Short Term Goals  To learn and exhibit compliance with exercise, home and travel O2 prescription;To learn and understand importance of monitoring SPO2 with pulse oximeter and demonstrate accurate use of the pulse oximeter.;To learn and understand importance of maintaining oxygen saturations>88%;To learn and demonstrate proper pursed lip breathing techniques or other breathing techniques.;To learn and demonstrate proper use of respiratory medications    Long  Term Goals  Exhibits compliance with exercise, home and  travel O2 prescription;Verbalizes importance of monitoring SPO2 with pulse oximeter and return demonstration;Maintenance of O2 saturations>88%;Exhibits proper breathing techniques, such as pursed lip breathing or other method taught during program session;Compliance with respiratory medication;Demonstrates proper use of MDI's    Goals/Expected Outcomes  Compliance       Initial Exercise Prescription: Initial Exercise Prescription - 01/11/19 1600      Date of Initial Exercise RX and Referring Provider   Date  01/11/19    Referring Provider  Dr. [name]      Oxygen   Oxygen  Continuous    Liters  8-10      Treadmill   MPH  1.6    Grade  1    Minutes  15      NuStep   Level  2    SPM  80    Minutes  15      Prescription Details   Frequency (times per week)  2    Duration  Progress to 30 minutes of continuous aerobic without signs/symptoms of physical distress      Intensity   THRR 40-80% of Max Heartrate  68-136    Ratings of Perceived Exertion  11-13    Perceived Dyspnea  0-4      Progression   Progression  Continue progressive overload as per policy without signs/symptoms or physical distress.      Resistance Training   Training Prescription  Yes    Weight  orange bands    Reps  10-15       Perform Capillary Blood Glucose checks as needed.  Exercise Prescription Changes: Exercise Prescription Changes    Row Name 01/16/19 1139 02/01/19 1135 02/20/19 1100 04/17/19 1100 04/26/19 1116     Response to Exercise   Blood Pressure (Admit)  128/80  104/70  126/70  130/84  128/76   Blood Pressure (Exercise)  140/88  100/64  140/80  132/70  130/84   Blood Pressure (Exit)  118/84  104/60  128/88  110/70  132/80   Heart Rate (Admit)  101 bpm  93 bpm  103 bpm  108 bpm  97 bpm   Heart Rate (Exercise)  128 bpm  118 bpm  129 bpm  110 bpm  110 bpm   Heart Rate (Exit)  100 bpm  100 bpm  102 bpm  107 bpm  104 bpm   Oxygen Saturation (Admit)  99 %  97 %  98 %  96 %  98 %    Oxygen Saturation (Exercise)  94 %  90 %  90 %  97 %  97 %   Oxygen Saturation (Exit)  98 %  97 %  98 %  95 %  95 %   Rating of Perceived Exertion (Exercise)  _0 Perceived Dyspnea (Exercise)  1._1 Duration  Continue with 30 min of aerobic exercise without signs/symptoms of physical distress.  Continue with 30 min of aerobic exercise without signs/symptoms of physical distress.  Continue with 30 min of aerobic exercise without signs/symptoms of physical distress.  Continue with 30 min of aerobic exercise without signs/symptoms of physical distress.  Continue with 30 min of aerobic exercise without signs/symptoms of physical distress.   Intensity  Other (comment) 40-80% HRR  THRR unchanged  THRR unchanged  THRR unchanged  THRR unchanged     Progression   Progression  Continue to progress workloads to maintain intensity without signs/symptoms of physical distress.  Continue to progress workloads to maintain intensity without signs/symptoms of physical distress.  Continue to progress workloads to maintain intensity without signs/symptoms of physical distress.  Continue to progress workloads to maintain intensity without signs/symptoms of physical distress.  Continue to progress workloads to maintain intensity without signs/symptoms of physical distress.     Resistance Training   Training Prescription  Yes  Yes  Yes  Yes  Yes   Weight  orange bands  orange bands  orange bands  orange bands  orange bands   Reps  10-15  10-15  10-15  10-15  10-15   Time  10 Minutes  10 Minutes  10 Minutes  10 Minutes  10 Minutes     Interval Training   Interval Training  No  --  --  --  --     Oxygen   Oxygen  Continuous  Continuous  Continuous  Continuous  Continuous   Liters  8-10 L  8-_2 8-10     Treadmill   MPH  1.6  --  1.6  --  1.6   Grade  1  --  1  --  2   Minutes  15  --  15  --  15     NuStep   Level  _3 SPM  80  80  80  80  80   Minutes  _4 METs  1.6  --  --  1.6  1.6   Row Name 05/08/19 1100             Home Exercise Plan   Plans to continue exercise at  Home (comment)       Frequency  Add 2 additional days to program exercise sessions.  Initial Home Exercises Provided  05/08/19          Exercise Comments: Exercise Comments    Row Name 05/08/19 1145           Exercise Comments  home exercise complete          Exercise Goals and Review: Exercise Goals    Row Name 01/11/19 1633 02/05/19 1121 04/10/19 0905 05/15/19 0715       Exercise Goals   Increase Physical Activity  Yes  Yes  Yes  Yes    Intervention  Provide advice, education, support and counseling about physical activity/exercise needs.;Develop an individualized exercise prescription for aerobic and resistive training based on initial evaluation findings, risk stratification, comorbidities and participant's personal goals.  Provide advice, education, support and counseling about physical activity/exercise needs.;Develop an individualized exercise prescription for aerobic and resistive training based on initial evaluation findings, risk stratification, comorbidities and participant's personal goals.  Provide advice, education, support and counseling about physical activity/exercise needs.;Develop an individualized exercise prescription for aerobic and resistive training based on initial evaluation findings, risk stratification, comorbidities and participant's personal goals.  Provide advice, education, support and counseling about physical activity/exercise needs.;Develop an individualized exercise prescription for aerobic and resistive training based on initial evaluation findings, risk stratification, comorbidities and participant's personal goals.    Expected Outcomes  Short Term: Attend rehab on a regular basis to increase amount of physical activity.;Long Term: Add in home exercise to make exercise part of routine and to increase amount of  physical activity.;Long Term: Exercising regularly at least 3-5 days a week.  Short Term: Attend rehab on a regular basis to increase amount of physical activity.;Long Term: Add in home exercise to make exercise part of routine and to increase amount of physical activity.;Long Term: Exercising regularly at least 3-5 days a week.  Short Term: Attend rehab on a regular basis to increase amount of physical activity.;Long Term: Add in home exercise to make exercise part of routine and to increase amount of physical activity.;Long Term: Exercising regularly at least 3-5 days a week.  Short Term: Attend rehab on a regular basis to increase amount of physical activity.;Long Term: Add in home exercise to make exercise part of routine and to increase amount of physical activity.;Long Term: Exercising regularly at least 3-5 days a week.    Increase Strength and Stamina  Yes  Yes  Yes  Yes    Intervention  Provide advice, education, support and counseling about physical activity/exercise needs.;Develop an individualized exercise prescription for aerobic and resistive training based on initial evaluation findings, risk stratification, comorbidities and participant's personal goals.  Provide advice, education, support and counseling about physical activity/exercise needs.;Develop an individualized exercise prescription for aerobic and resistive training based on initial evaluation findings, risk stratification, comorbidities and participant's personal goals.  Provide advice, education, support and counseling about physical activity/exercise needs.;Develop an individualized exercise prescription for aerobic and resistive training based on initial evaluation findings, risk stratification, comorbidities and participant's personal goals.  Provide advice, education, support and counseling about physical activity/exercise needs.;Develop an individualized exercise prescription for aerobic and resistive training based on initial  evaluation findings, risk stratification, comorbidities and participant's personal goals.    Expected Outcomes  Short Term: Increase workloads from initial exercise prescription for resistance, speed, and METs.;Short Term: Perform resistance training exercises routinely during rehab and add in resistance training at home;Long Term: Improve cardiorespiratory fitness, muscular endurance and strength as measured by increased METs and functional capacity (6MWT)  Short Term: Increase  workloads from initial exercise prescription for resistance, speed, and METs.;Short Term: Perform resistance training exercises routinely during rehab and add in resistance training at home;Long Term: Improve cardiorespiratory fitness, muscular endurance and strength as measured by increased METs and functional capacity (6MWT)  Short Term: Increase workloads from initial exercise prescription for resistance, speed, and METs.;Short Term: Perform resistance training exercises routinely during rehab and add in resistance training at home;Long Term: Improve cardiorespiratory fitness, muscular endurance and strength as measured by increased METs and functional capacity (6MWT)  Short Term: Increase workloads from initial exercise prescription for resistance, speed, and METs.;Short Term: Perform resistance training exercises routinely during rehab and add in resistance training at home;Long Term: Improve cardiorespiratory fitness, muscular endurance and strength as measured by increased METs and functional capacity (6MWT)    Able to understand and use rate of perceived exertion (RPE) scale  Yes  Yes  Yes  Yes    Intervention  Provide education and explanation on how to use RPE scale  Provide education and explanation on how to use RPE scale  Provide education and explanation on how to use RPE scale  Provide education and explanation on how to use RPE scale    Expected Outcomes  Short Term: Able to use RPE daily in rehab to express subjective  intensity level;Long Term:  Able to use RPE to guide intensity level when exercising independently  Short Term: Able to use RPE daily in rehab to express subjective intensity level;Long Term:  Able to use RPE to guide intensity level when exercising independently  Short Term: Able to use RPE daily in rehab to express subjective intensity level;Long Term:  Able to use RPE to guide intensity level when exercising independently  Short Term: Able to use RPE daily in rehab to express subjective intensity level;Long Term:  Able to use RPE to guide intensity level when exercising independently    Able to understand and use Dyspnea scale  Yes  Yes  Yes  Yes    Intervention  Provide education and explanation on how to use Dyspnea scale  Provide education and explanation on how to use Dyspnea scale  Provide education and explanation on how to use Dyspnea scale  Provide education and explanation on how to use Dyspnea scale    Expected Outcomes  Short Term: Able to use Dyspnea scale daily in rehab to express subjective sense of shortness of breath during exertion;Long Term: Able to use Dyspnea scale to guide intensity level when exercising independently  Short Term: Able to use Dyspnea scale daily in rehab to express subjective sense of shortness of breath during exertion;Long Term: Able to use Dyspnea scale to guide intensity level when exercising independently  Short Term: Able to use Dyspnea scale daily in rehab to express subjective sense of shortness of breath during exertion;Long Term: Able to use Dyspnea scale to guide intensity level when exercising independently  Short Term: Able to use Dyspnea scale daily in rehab to express subjective sense of shortness of breath during exertion;Long Term: Able to use Dyspnea scale to guide intensity level when exercising independently    Knowledge and understanding of Target Heart Rate Range (THRR)  Yes  Yes  Yes  Yes    Intervention  Provide education and explanation of THRR  including how the numbers were predicted and where they are located for reference  Provide education and explanation of THRR including how the numbers were predicted and where they are located for reference  Provide education and explanation of  THRR including how the numbers were predicted and where they are located for reference  Provide education and explanation of THRR including how the numbers were predicted and where they are located for reference    Expected Outcomes  Short Term: Able to state/look up THRR;Long Term: Able to use THRR to govern intensity when exercising independently;Short Term: Able to use daily as guideline for intensity in rehab  Short Term: Able to state/look up THRR;Long Term: Able to use THRR to govern intensity when exercising independently;Short Term: Able to use daily as guideline for intensity in rehab  Short Term: Able to state/look up THRR;Long Term: Able to use THRR to govern intensity when exercising independently;Short Term: Able to use daily as guideline for intensity in rehab  Short Term: Able to state/look up THRR;Long Term: Able to use THRR to govern intensity when exercising independently;Short Term: Able to use daily as guideline for intensity in rehab    Understanding of Exercise Prescription  Yes  Yes  Yes  Yes    Intervention  Provide education, explanation, and written materials on patient's individual exercise prescription  Provide education, explanation, and written materials on patient's individual exercise prescription  Provide education, explanation, and written materials on patient's individual exercise prescription  Provide education, explanation, and written materials on patient's individual exercise prescription    Expected Outcomes  Short Term: Able to explain program exercise prescription;Long Term: Able to explain home exercise prescription to exercise independently  Short Term: Able to explain program exercise prescription;Long Term: Able to explain  home exercise prescription to exercise independently  Short Term: Able to explain program exercise prescription;Long Term: Able to explain home exercise prescription to exercise independently  Short Term: Able to explain program exercise prescription;Long Term: Able to explain home exercise prescription to exercise independently       Exercise Goals Re-Evaluation : Exercise Goals Re-Evaluation    Row Name 02/05/19 1122 04/10/19 0905 05/15/19 0715 05/15/19 0805       Exercise Goal Re-Evaluation   Exercise Goals Review  Increase Physical Activity;Increase Strength and Stamina;Able to understand and use rate of perceived exertion (RPE) scale;Able to understand and use Dyspnea scale;Knowledge and understanding of Target Heart Rate Range (THRR);Understanding of Exercise Prescription  Increase Physical Activity;Increase Strength and Stamina;Able to understand and use rate of perceived exertion (RPE) scale;Able to understand and use Dyspnea scale;Knowledge and understanding of Target Heart Rate Range (THRR);Understanding of Exercise Prescription  Increase Physical Activity;Increase Strength and Stamina;Able to understand and use rate of perceived exertion (RPE) scale;Able to understand and use Dyspnea scale;Knowledge and understanding of Target Heart Rate Range (THRR);Understanding of Exercise Prescription  --    Comments  Pt has completed 3 exercise sessions. Pt missed a couple of visits following her last chemo session. Pt returned and experienced weakness. Pt currently exercises at 1.7 METs on the stepper. Will continue to monitor and progress as able.  Pt has not exercised since we ceased in person exercise. Pt will return 2/23. Will monitor and progress as able.  Pt has attended 13 exercise sessions. She is progressing slowly and remains lethargic and SOB. Pt currently exercises at 1.8 METs on the stepper. Will continue to monitor and progress as able.  --    Expected Outcomes  Through exercise at rehab  and at home, the patient will decrease shortness of breath with daily activities and feel confident in carrying out an exercise regime at home.   Through exercise at rehab and at home, the patient will decrease  shortness of breath with daily activities and feel confident in carrying out an exercise regime at home.   Through exercise at rehab and at home, the patient will decrease shortness of breath with daily activities and feel confident in carrying out an exercise regime at home.   Through exercise at rehab and at home, the patient will decrease shortness of breath with daily activities and feel confident in carrying out an exercise regime at home.        Discharge Exercise Prescription (Final Exercise Prescription Changes): Exercise Prescription Changes - 05/08/19 1100      Home Exercise Plan   Plans to continue exercise at  Home (comment)    Frequency  Add 2 additional days to program exercise sessions.    Initial Home Exercises Provided  05/08/19       Nutrition:  Target Goals: Understanding of nutrition guidelines, daily intake of sodium <1533m, cholesterol <2023m calories 30% from fat and 7% or less from saturated fats, daily to have 5 or more servings of fruits and vegetables.  Biometrics: Pre Biometrics - 01/11/19 1502      Pre Biometrics   Grip Strength  18 kg        Nutrition Therapy Plan and Nutrition Goals: Nutrition Therapy & Goals - 02/01/19 1408      Nutrition Therapy   Diet  Mediterranean      Personal Nutrition Goals   Nutrition Goal  Pt to identify food quantities necessary to achieve weight loss of 6-24 lb at graduation from cardiac rehab.    Personal Goal #2  Pt to build a healthy plate including vegetables, fruits, whole grains, and low-fat dairy products in a heart healthy meal plan.      Intervention Plan   Intervention  Prescribe, educate and counsel regarding individualized specific dietary modifications aiming towards targeted core components such as  weight, hypertension, lipid management, diabetes, heart failure and other comorbidities.;Nutrition handout(s) given to patient.    Expected Outcomes  Short Term Goal: Understand basic principles of dietary content, such as calories, fat, sodium, cholesterol and nutrients.;Short Term Goal: A plan has been developed with personal nutrition goals set during dietitian appointment.;Long Term Goal: Adherence to prescribed nutrition plan.       Nutrition Assessments: Nutrition Assessments - 02/01/19 1436      Rate Your Plate Scores   Pre Score  46       Nutrition Goals Re-Evaluation: Nutrition Goals Re-Evaluation    [place] 02/01/19 1435 05/11/19 0844           Goals   Current Weight  221 lb (100.2 kg)  220 lb (99.8 kg)      Nutrition Goal  Pt to identify food quantities necessary to achieve weight loss of 6-24 lb at graduation from cardiac rehab.  Pt to identify food quantities necessary to achieve weight loss of 6-24 lb at graduation from cardiac rehab.      Comment  --  Weight loss 1 lb since start of pulmomary rehab        Personal Goal #2 Re-Evaluation   Personal Goal #2  Pt to build a healthy plate including vegetables, fruits, whole grains, and low-fat dairy products in a heart healthy meal plan.  Pt to build a healthy plate including vegetables, fruits, whole grains, and low-fat dairy products in a heart healthy meal plan.         Nutrition Goals Discharge (Final Nutrition Goals Re-Evaluation): Nutrition Goals Re-Evaluation - 05/11/19 [id number]  Goals   Current Weight  220 lb (99.8 kg)    Nutrition Goal  Pt to identify food quantities necessary to achieve weight loss of 6-24 lb at graduation from cardiac rehab.    Comment  Weight loss 1 lb since start of pulmomary rehab      Personal Goal #2 Re-Evaluation   Personal Goal #2  Pt to build a healthy plate including vegetables, fruits, whole grains, and low-fat dairy products in a heart healthy meal plan.        Psychosocial: Target Goals: Acknowledge presence or absence of significant depression and/or stress, maximize coping skills, provide positive support system. Participant is able to verbalize types and ability to use techniques and skills needed for reducing stress and depression.  Initial Review & Psychosocial Screening: Initial Psych Review & Screening - 01/11/19 1505      Initial Review   Current issues with  Current Stress Concerns    Source of Stress Concerns  Chronic Illness    Comments  Is in need of losing weight so she can have a lung transplant, will need to go down to 180 pounds      [place]?  Yes      Barriers   Psychosocial barriers to participate in program  There are no identifiable barriers or psychosocial needs.      Screening Interventions   Interventions  Encouraged to exercise       Quality of Life Scores:  Scores of 19 and below usually indicate a poorer quality of life in these areas.  A difference of  2-3 points is a clinically meaningful difference.  A difference of 2-3 points in the total score of the Quality of Life Index has been associated with significant improvement in overall quality of life, self-image, physical symptoms, and general health in studies assessing change in quality of life.  PHQ-9: Recent Review Flowsheet Data    Depression screen [facility] 2/9 01/11/2019 02/13/2018 12/22/2017 08/10/2017   Decreased Interest 0 _0 Down, Depressed, Hopeless 0 1 0 1   PHQ - 2 Score 0 _1 Altered sleeping _2 Tired, decreased energy _3 Change in appetite _4 Feeling bad or failure about yourself  0 _5 Trouble concentrating 0 _6 Moving slowly or fidgety/restless 0 1 0 0   Suicidal thoughts 0 0 0 0   PHQ-9 Score - _7 Difficult doing work/chores Not difficult at all Very difficult Somewhat difficult Very difficult     Interpretation of Total Score  Total Score Depression  Severity:  1-4 = Minimal depression, 5-9 = Mild depression, 10-14 = Moderate depression, 15-19 = Moderately severe depression, 20-27 = Severe depression   Psychosocial Evaluation and Intervention: Psychosocial Evaluation - 01/11/19 1506      Psychosocial Evaluation & Interventions   Interventions  Encouraged to exercise with the program and follow exercise prescription;Stress management education    Continue Psychosocial Services   No Follow up required       Psychosocial Re-Evaluation: Psychosocial Re-Evaluation    [place] Name 02/05/19 1058 04/09/19 1355 05/14/19 1320         Psychosocial Re-Evaluation   Current issues with  Current Stress Concerns  Current Stress Concerns  Current Stress Concerns     Comments  Just started the program, has  attended 3 exercise sessions, was absent for 1 week d/t side effects of chemotherapy.  She is considering a lung transplant but needs to lose weight and has not lost any.  unable to assess psychosocial concerns due to department closure x 7 weeks, will assess on 04/17/2019 when in person exercise classes begin.  Continues to have stress due to her chronic interstital lung disease, her pulmonologist is evaluating a new lung nodule found on a recent CT scan.     Expected Outcomes  No barriers or psychosocial concerns while in pulmonary rehab and that  No barriers to participation in pulmonary rehab  For [place] to manage her stress in healthy coping manners.     Interventions  Stress management education;Relaxation education given a meditation handout  Stress management education;Encouraged to attend Pulmonary Rehabilitation for the exercise  Relaxation education;Stress management education;Encouraged to attend Pulmonary Rehabilitation for the exercise     Continue Psychosocial Services   Follow up required by staff  No Follow up required  Follow up required by staff     Comments  --  --  Will continue to follow for changes in coping mechanisms or behavior.        Initial Review   Source of Stress Concerns  Chronic Illness;Unable to participate in former interests or hobbies;Unable to perform yard/household activities a possible lung transplant  Chronic Illness;Unable to participate in former interests or hobbies;Unable to perform yard/household activities  Chronic Illness;Unable to participate in former interests or hobbies;Unable to perform yard/household activities        Psychosocial Discharge (Final Psychosocial Re-Evaluation): Psychosocial Re-Evaluation - 05/14/19 1320      Psychosocial Re-Evaluation   Current issues with  Current Stress Concerns    Comments  Continues to have stress due to her chronic interstital lung disease, her pulmonologist is evaluating a new lung nodule found on a recent CT scan.    Expected Outcomes  For [name] to manage her stress in healthy coping manners.    Interventions  Relaxation education;Stress management education;Encouraged to attend Pulmonary Rehabilitation for the exercise    Continue Psychosocial Services   Follow up required by staff    Comments  Will continue to follow for changes in coping mechanisms or behavior.      Initial Review   Source of Stress Concerns  Chronic Illness;Unable to participate in former interests or hobbies;Unable to perform yard/household activities       Education: Education Goals: Education classes will be provided on a weekly basis, covering required topics. Participant will state understanding/return demonstration of topics presented.  Learning Barriers/Preferences:   Education Topics: Risk Factor Reduction:  -Group instruction that is supported by a PowerPoint presentation. Instructor discusses the definition of a risk factor, different risk factors for pulmonary disease, and how the heart and lungs work together.     PULMONARY REHAB OTHER RESPIRATORY from 02/13/2019 in [place]  Date  01/16/19  Educator  -- [Handout]       Nutrition for Pulmonary Patient:  -Group instruction provided by PowerPoint slides, verbal discussion, and written materials to support subject matter. The instructor gives an explanation and review of healthy diet recommendations, which includes a discussion on weight management, recommendations for fruit and vegetable consumption, as well as protein, fluid, caffeine, fiber, sodium, sugar, and alcohol. Tips for eating when patients are short of breath are discussed.   PULMONARY REHAB OTHER RESPIRATORY from 02/13/2019 in [place]  Date  02/13/19  Educator  -- [Handout]      Pursed Lip Breathing:  -Group instruction that is supported by demonstration and informational handouts. Instructor discusses the benefits of pursed lip and diaphragmatic breathing and detailed demonstration on how to preform both.     Oxygen Safety:  -Group instruction provided by PowerPoint, verbal discussion, and written material to support subject matter. There is an overview of "What is Oxygen" and "Why do we need it".  Instructor also reviews how to create a safe environment for oxygen use, the importance of using oxygen as prescribed, and the risks of noncompliance. There is a brief discussion on traveling with oxygen and resources the patient may utilize.   PULMONARY REHAB OTHER RESPIRATORY from 12/15/2017 in [place]  Date  11/24/17  Educator  Cloyde Reams  Instruction Review Code  1- Verbalizes Understanding      Oxygen Equipment:  -Group instruction provided by [employer] utilizing handouts, written materials, and Insurance underwriter.   Signs and Symptoms:  -Group instruction provided by written material and verbal discussion to support subject matter. Warning signs and symptoms of infection, stroke, and heart attack are reviewed and when to call the physician/911 reinforced. Tips for preventing the spread of infection discussed.    PULMONARY REHAB OTHER RESPIRATORY from 12/15/2017 in [place]  Date  09/01/17  Educator  RN  Instruction Review Code  1- Verbalizes Understanding      Advanced Directives:  -Group instruction provided by verbal instruction and written material to support subject matter. Instructor reviews Advanced Directive laws and proper instruction for filling out document.   Pulmonary Video:  -Group video education that reviews the importance of medication and oxygen compliance, exercise, good nutrition, pulmonary hygiene, and pursed lip and diaphragmatic breathing for the pulmonary patient.   Exercise for the Pulmonary Patient:  -Group instruction that is supported by a PowerPoint presentation. Instructor discusses benefits of exercise, core components of exercise, frequency, duration, and intensity of an exercise routine, importance of utilizing pulse oximetry during exercise, safety while exercising, and options of places to exercise outside of rehab.     PULMONARY REHAB OTHER RESPIRATORY from 12/15/2017 in [place]  Date  09/22/17  Educator  Cloyde Reams  Instruction Review Code  1- Verbalizes Understanding      Pulmonary Medications:  -Verbally interactive group education provided by instructor with focus on inhaled medications and proper administration.   PULMONARY REHAB OTHER RESPIRATORY from 12/15/2017 in [place]  Date  11/01/17  Educator  pharm      Anatomy and Physiology of the Respiratory System and Intimacy:  -Group instruction provided by PowerPoint, verbal discussion, and written material to support subject matter. Instructor reviews respiratory cycle and anatomical components of the respiratory system and their functions. Instructor also reviews differences in obstructive and restrictive respiratory diseases with examples of each. Intimacy, Sex, and Sexuality differences are reviewed  with a discussion on how relationships can change when diagnosed with pulmonary disease. Common sexual concerns are reviewed.   PULMONARY REHAB OTHER RESPIRATORY from 05/03/2019 in [place]  Date  05/03/19  Educator  Handout      MD DAY -A group question and answer session with a medical doctor that allows participants to ask questions that relate to their pulmonary disease state.   PULMONARY REHAB OTHER RESPIRATORY from 12/15/2017 in [place]  Date  12/15/17  Educator  yacoub  Instruction Review Code  2- Demonstrated Understanding      OTHER EDUCATION -Group or individual verbal, written, or video instructions that support the educational goals of the pulmonary rehab program.   PULMONARY REHAB OTHER RESPIRATORY from 05/03/2019 in [place]  Date  04/26/19  Educator  DF Gwyndolyn Kaufman your numbers]  Instruction Review Code  2- Demonstrated Understanding      Holiday Eating Survival Tips:  -Group instruction provided by PowerPoint slides, verbal discussion, and written materials to support subject matter. The instructor gives patients tips, tricks, and techniques to help them not only survive but enjoy the holidays despite the onslaught of food that accompanies the holidays.   Knowledge Questionnaire Score: Knowledge Questionnaire Score - 01/11/19 1556      Knowledge Questionnaire Score   Pre Score  17/18       Core Components/Risk Factors/Patient Goals at Admission: Personal Goals and Risk Factors at Admission - 01/11/19 1507      Core Components/Risk Factors/Patient Goals on Admission    Weight Management  Yes;Weight Loss    Admit Weight  224 lb 13.9 oz (102 kg)    Goal Weight: Short Term  220 lb 7.4 oz (100 kg)    Goal Weight: Long Term  180 lb (81.6 kg)    Improve shortness of breath with ADL's  Yes    Intervention  Provide education, individualized exercise plan and daily  activity instruction to help decrease symptoms of SOB with activities of daily living.    Expected Outcomes  Short Term: Improve cardiorespiratory fitness to achieve a reduction of symptoms when performing ADLs;Long Term: Be able to perform more ADLs without symptoms or delay the onset of symptoms       Core Components/Risk Factors/Patient Goals Review:  Goals and Risk Factor Review    Row Name 01/11/19 1514 02/05/19 1110 04/09/19 1357         Core Components/Risk Factors/Patient Goals Review   Personal Goals Review  Weight Management/Obesity;Develop more efficient breathing techniques such as purse lipped breathing and diaphragmatic breathing and practicing self-pacing with activity.;Stress;Increase knowledge of respiratory medications and ability to use respiratory devices properly.;Improve shortness of breath with ADL's  Weight Management/Obesity;Increase knowledge of respiratory medications and ability to use respiratory devices properly.;Improve shortness of breath with ADL's;Stress  Weight Management/Obesity;Develop more efficient breathing techniques such as purse lipped breathing and diaphragmatic breathing and practicing self-pacing with activity.;Increase knowledge of respiratory medications and ability to use respiratory devices properly.;Improve shortness of breath with ADL's     Review  --  She just started the program and it is too early to have met any program goals.  She has been slow to progress due to poor attendance, department has been closed x 7 weeks and will resume in person exercise classes on 04/17/2019.  We will decide what levels she should be exercising at upon return.     Expected Outcomes  --  See admission goals.  See admission goals.        Core Components/Risk Factors/Patient Goals at Discharge (Final Review):  Goals and Risk Factor Review - 04/09/19 1357      Core Components/Risk Factors/Patient Goals Review   Personal Goals Review  Weight  Management/Obesity;Develop more efficient breathing techniques such as purse lipped breathing and diaphragmatic breathing and practicing self-pacing with activity.;Increase knowledge of respiratory medications and ability to use respiratory devices properly.;Improve shortness of breath with ADL's    Review  She has been slow  to progress due to poor attendance, department has been closed x 7 weeks and will resume in person exercise classes on 04/17/2019.  We will decide what levels she should be exercising at upon return.    Expected Outcomes  See admission goals.       ITP Comments:   Comments: ITP REVIEW Pt is making expected progress toward pulmonary rehab goals after completing 13 sessions. Recommend continued exercise, life style modification, education, and utilization of breathing techniques to increase stamina and strength and decrease shortness of breath with exertion.

## 2019-05-15 NOTE — Progress Notes (Signed)
Echo normal except for mild heart muscle stiffness that is contributing to dyspnea but is to be expected with her illness. She is alerady on diuretics

## 2019-05-15 NOTE — Progress Notes (Signed)
Daily Session Note  Patient Details  Name: Tasha Ballard MRN: [id number] Date of Birth: 07/17/1970 Referring Provider:     Pulmonary Rehab Walk Test from 01/11/2019 in [facility]  Referring Provider  Dr. Ramaswamy      Encounter Date: 05/15/2019  Check In:   Capillary Blood Glucose: No results found for this or any previous visit (from the past 24 hour(s)). POCT Glucose - 05/15/19 1204      POCT Blood Glucose   Pre-Exercise  130 mg/dL    Post-Exercise  136 mg/dL      Exercise Prescription Changes - 05/15/19 1200      Response to Exercise   Blood Pressure (Admit)  100/70    Blood Pressure (Exit)  100/60    Heart Rate (Admit)  97 bpm    Heart Rate (Exercise)  112 bpm    Heart Rate (Exit)  102 bpm    Oxygen Saturation (Admit)  99 %    Oxygen Saturation (Exercise)  98 %    Oxygen Saturation (Exit)  99 %    Rating of Perceived Exertion (Exercise)  13    Perceived Dyspnea (Exercise)  3    Duration  Continue with 30 min of aerobic exercise without signs/symptoms of physical distress.    Intensity  THRR unchanged      Progression   Progression  Continue to progress workloads to maintain intensity without signs/symptoms of physical distress.      Resistance Training   Training Prescription  Yes    Weight  orange bands    Reps  10-15    Time  10 Minutes      Oxygen   Oxygen  Continuous    Liters  8      Treadmill   MPH  1.7    Grade  1    Minutes  15      NuStep   Level  3    SPM  80    Minutes  15    METs  1.9       Social History   Tobacco Use  Smoking Status Never Smoker  Smokeless Tobacco Never Used    Goals Met:  Proper associated with RPD/PD & O2 Sat Exercise tolerated well Strength training completed today  Goals Unmet:  Not Applicable  Comments: Service time is from 1025 to 1115    Dr. Traci Turner is Medical Director for Cardiac Rehab at [facility]. 

## 2019-05-16 NOTE — Telephone Encounter (Signed)
Rachael and Amber, do you have an update for [name] in regards to the PA?

## 2019-05-17 ENCOUNTER — Encounter (HOSPITAL_COMMUNITY)
Admission: RE | Admit: 2019-05-17 | Discharge: 2019-05-17 | Disposition: A | Discharge status: Home / Self Care | Payer: BC Managed Care – PPO | Source: Ambulatory Visit | Attending: Internal Medicine | Admitting: Internal Medicine

## 2019-05-17 ENCOUNTER — Other Ambulatory Visit: Payer: Self-pay

## 2019-05-17 DIAGNOSIS — J84112 Idiopathic pulmonary fibrosis: Secondary | ICD-10-CM | POA: Diagnosis not present

## 2019-05-17 DIAGNOSIS — J849 Interstitial pulmonary disease, unspecified: Secondary | ICD-10-CM

## 2019-05-17 MED ORDER — ESBRIET 267 MG PO TABS: ORAL_TABLET | ORAL | 0 refills | Status: DC

## 2019-05-17 NOTE — Telephone Encounter (Signed)
Patient returned call.  Reviewed dosing and common side effects of Esbriet including GI upset and sun-sensitivity.  Reviewed lab monitoring schedule of monthly for the first 6 months of therapy.  Patient verbalized understanding and will return in 4 weeks for labs.  Future orders placed.  She receives Ofev through [employer].  Updated medication list reflect specialty pharmacy.  Patient is not locked into a specific specialty pharmacy.  Patient inquired about [facility].  Informed that [facility] does not have Ofev at this time but would be able to refill Esbriet.  Patient requested prescription be sent to [facility] for pick up Friday or Monday.  Will follow-up with patient in 2 weeks.   Mariella Saa, PharmD, [place], [place] Clinical Specialty Pharmacist [phone]  05/17/2019 2:12 PM

## 2019-05-17 NOTE — Progress Notes (Signed)
Daily Session Note  Patient Details  Name: Tasha Ballard MRN: [id number] Date of Birth: [birth year] Referring Provider:     Pulmonary Rehab Walk Test from 01/11/2019 in [place]  Referring Provider  Dr. [name]      Encounter Date: 05/17/2019  Check In: Session Check In - 05/17/19 1152      Check-In   Supervising physician immediately available to respond to emergencies  [facility] immediately available    Physician(s)  Dr. Marylyn Ishihara    Location  [facility]    Staff Present  Rosebud Poles, RN, Bjorn Loser, MS, Exercise Physiologist    Virtual Visit  No    Medication changes reported      No    Fall or balance concerns reported     No    Tobacco Cessation  No Change    Warm-up and Cool-down  Performed as group-led instruction    Resistance Training Performed  Yes    VAD Patient?  No    PAD/SET Patient?  No      Pain Assessment   Currently in Pain?  No/denies    Multiple Pain Sites  No       Capillary Blood Glucose: No results found for this or any previous visit (from the past 24 hour(s)).    Social History   Tobacco Use  Smoking Status Never Smoker  Smokeless Tobacco Never Used    Goals Met:  Proper associated with RPD/PD & O2 Sat Exercise tolerated well Strength training completed today  Goals Unmet:  Not Applicable  Comments: Service time is from 1045 to [place]    Dr. Fransico Him is Medical Director for Cardiac Rehab at [facility].

## 2019-05-18 ENCOUNTER — Encounter (HOSPITAL_COMMUNITY): Payer: BC Managed Care – PPO

## 2019-05-18 MED ORDER — OFEV 150 MG PO CAPS: 150.0000 mg | ORAL_CAPSULE | Freq: Two times a day (BID) | ORAL | 11 refills | Status: DC

## 2019-05-18 MED FILL — ESBRIET 267 MG TABS: 267 | 30 days supply | Qty: 207 | Fill #0

## 2019-05-22 ENCOUNTER — Other Ambulatory Visit: Payer: Self-pay

## 2019-05-22 ENCOUNTER — Encounter (HOSPITAL_COMMUNITY)
Admission: RE | Admit: 2019-05-22 | Discharge: 2019-05-22 | Disposition: A | Discharge status: Home / Self Care | Payer: BC Managed Care – PPO | Source: Ambulatory Visit | Attending: Internal Medicine | Admitting: Internal Medicine

## 2019-05-22 DIAGNOSIS — J849 Interstitial pulmonary disease, unspecified: Secondary | ICD-10-CM

## 2019-05-22 DIAGNOSIS — J84112 Idiopathic pulmonary fibrosis: Secondary | ICD-10-CM | POA: Diagnosis not present

## 2019-05-22 NOTE — Progress Notes (Signed)
Daily Session Note  Patient Details  Name: Tasha Ballard MRN: [id number] Date of Birth: [birth year] Referring Provider:     Pulmonary Rehab Walk Test from 01/11/2019 in [place]  Referring Provider  Dr. [name]      Encounter Date: 05/22/2019  Check In: Session Check In - 05/22/19 1141      Check-In   Supervising physician immediately available to respond to emergencies  [facility] immediately available    Physician(s)  Dr. Sharlet Salina    Location  [facility]    Staff Present  Rosebud Poles, RN, Bjorn Loser, MS, Exercise Physiologist;Rifka Ramey Ysidro Evert, RN    Virtual Visit  No    Medication changes reported      No    Fall or balance concerns reported     No    Tobacco Cessation  No Change    Warm-up and Cool-down  Performed as group-led instruction    Resistance Training Performed  Yes    VAD Patient?  No    PAD/SET Patient?  No      Pain Assessment   Currently in Pain?  No/denies    Multiple Pain Sites  No       Capillary Blood Glucose: No results found for this or any previous visit (from the past 24 hour(s)).    Social History   Tobacco Use  Smoking Status Never Smoker  Smokeless Tobacco Never Used    Goals Met:  Exercise tolerated well No report of cardiac concerns or symptoms Strength training completed today  Goals Unmet:  Not Applicable  Comments: Service time is from 1040 to 1132     Dr. Fransico Him is Medical Director for Cardiac Rehab at [facility].

## 2019-05-24 ENCOUNTER — Encounter (HOSPITAL_COMMUNITY): Payer: BC Managed Care – PPO

## 2019-05-25 ENCOUNTER — Encounter (HOSPITAL_COMMUNITY): Payer: BC Managed Care – PPO

## 2019-05-28 ENCOUNTER — Encounter: Payer: BC Managed Care – PPO | Admitting: Internal Medicine

## 2019-05-28 ENCOUNTER — Encounter: Payer: Self-pay | Admitting: Adult Health

## 2019-05-28 ENCOUNTER — Other Ambulatory Visit: Payer: Self-pay

## 2019-05-28 ENCOUNTER — Ambulatory Visit (INDEPENDENT_AMBULATORY_CARE_PROVIDER_SITE_OTHER): Payer: BC Managed Care – PPO | Admitting: Adult Health

## 2019-05-28 VITALS — BP 110/68 | HR 100 | Temp 98.2°F | Ht 62.0 in | Wt 223.0 lb

## 2019-05-28 DIAGNOSIS — J9611 Chronic respiratory failure with hypoxia: Secondary | ICD-10-CM | POA: Diagnosis not present

## 2019-05-28 DIAGNOSIS — Z6841 Body Mass Index (BMI) 40.0 and over, adult: Secondary | ICD-10-CM

## 2019-05-28 DIAGNOSIS — J849 Interstitial pulmonary disease, unspecified: Secondary | ICD-10-CM

## 2019-05-28 DIAGNOSIS — J8489 Other specified interstitial pulmonary diseases: Secondary | ICD-10-CM | POA: Diagnosis not present

## 2019-05-28 LAB — CBC WITH DIFFERENTIAL/PLATELET
Basophils Absolute: 0 10*3/uL (ref 0.0–0.1)
Basophils Relative: 0.3 % (ref 0.0–3.0)
Eosinophils Absolute: 0 10*3/uL (ref 0.0–0.7)
Eosinophils Relative: 0.5 % (ref 0.0–5.0)
HCT: 37.7 % (ref 36.0–46.0)
Hemoglobin: 12.3 g/dL (ref 12.0–15.0)
Lymphocytes Relative: 10.7 % — ABNORMAL LOW (ref 12.0–46.0)
Lymphs Abs: 1 10*3/uL (ref 0.7–4.0)
MCHC: 32.5 g/dL (ref 30.0–36.0)
MCV: 93.1 fl (ref 78.0–100.0)
Monocytes Absolute: 0.6 10*3/uL (ref 0.1–1.0)
Monocytes Relative: 6.3 % (ref 3.0–12.0)
Neutro Abs: 7.9 10*3/uL — ABNORMAL HIGH (ref 1.4–7.7)
Neutrophils Relative %: 82.2 % — ABNORMAL HIGH (ref 43.0–77.0)
Platelets: 301 10*3/uL (ref 150.0–400.0)
RBC: 4.05 Mil/uL (ref 3.87–5.11)
RDW: 14.6 % (ref 11.5–15.5)
WBC: 9.6 10*3/uL (ref 4.0–10.5)

## 2019-05-28 LAB — COMPREHENSIVE METABOLIC PANEL
ALT: 18 U/L (ref 0–35)
AST: 16 U/L (ref 0–37)
Albumin: 4.1 g/dL (ref 3.5–5.2)
Alkaline Phosphatase: 46 U/L (ref 39–117)
BUN: 16 mg/dL (ref 6–23)
CO2: 33 mEq/L — ABNORMAL HIGH (ref 19–32)
Calcium: 9.5 mg/dL (ref 8.4–10.5)
Chloride: 89 mEq/L — ABNORMAL LOW (ref 96–112)
Creatinine, Ser: 0.95 mg/dL (ref 0.40–1.20)
GFR: 62.08 mL/min (ref >60.00)
Glucose, Bld: 155 mg/dL — ABNORMAL HIGH (ref 70–99)
Potassium: 3.8 mEq/L (ref 3.5–5.1)
Sodium: 135 mEq/L (ref 135–145)
Total Bilirubin: 0.3 mg/dL (ref 0.2–1.2)
Total Protein: 6.7 g/dL (ref 6.0–8.3)

## 2019-05-28 NOTE — Assessment & Plan Note (Signed)
Continue on O2 , goal is to keep Oxygen sats >88-90%.

## 2019-05-28 NOTE — Progress Notes (Signed)
_0  ID: Tasha Ballard, female    DOB: [birth year], 49 y.o.   MRN: [id number]  Chief Complaint  Patient presents with  . Follow-up    ILD     Referring provider: [name], MD  HPI: 49 year old female never smoker seen for pulmonary consult November 22, 2017 to establish for evaluation of interstitial lung disease.  Patient has been followed at [facility].  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  Initially placed on CellCept.  In 2016 had a significant clinical decline and was started on oxygen she has been seen at [facility] with Dr. 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  Since that surgery she has had progressive respiratory decline and increased symptom burden. She was treated with 1 year of oral and IV Cytoxan from December 2019 to November  2020.  December 2019 she was started on Ofev 150 mg twice daily Started on Imuran January 2021.  She remains on prophylaxis Bactrim 3 days a week.   TEST/EVENTS :   05/28/2019 Follow up : ILD , O2 RF  Patient returns for a 1 month follow-up.  Over the last month patient says that her breathing has been stable.  She has had no flare of cough wheezing or shortness of breath.  She actually feels that she is slightly improved with improved activity tolerance as she is undergoing pulmonary rehab.  She remains on her baseline oxygen requirements with 6 L of oxygen at rest and 8 to 10 L with ambulation.  She works on the treadmill and bike at pulmonary rehab. Last visit she was started on Esbriet and is currently on the slow titration dosing.  She says she is tolerating well.  No nausea vomiting or diarrhea. She remains on Imuran 100 mg daily and prednisone 10 mg daily. She has received both of her Covid vaccines.  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  No interval progression since November 20, 2018.  There was a new left lower lobe nodule measuring 1.0 cm. A PET scan has been ordered for next week. [place] labs are pending .  Denies hemoptysis or weight loss.     Allergies  Allergen Reactions  . Macrobid [Nitrofurantoin Macrocrystal] Nausea And Vomiting  . Adhesive [Tape] Rash    Immunization History  Administered Date(s) Administered  . Influenza,inj,Quad PF,6+ Mos 12/24/2014, 02/05/2016, 01/07/2017, 11/22/2017, 10/20/2018  . Influenza-Unspecified 12/24/2014, 02/05/2016, 01/07/2017, 11/22/2017  . PFIZER SARS-COV-2 Vaccination 04/06/2019, 04/30/2019  . Pneumococcal Conjugate-13 07/20/2017  . Pneumococcal Polysaccharide-23 06/09/2012, 01/03/2019  . Tdap 02/19/2011    Past Medical History:  Diagnosis Date  . Aneurysm ([place])   . Back pain   . Diabetes mellitus without complication ([place])   . H/O blood clots   . Hypertension   . Joint pain   . Pulmonary fibrosis ([place])   . SOB (shortness of breath)   . Swelling   . Thyroid disease   . Vitamin D deficiency     Tobacco History: Social History   Tobacco Use  Smoking Status Never Smoker  Smokeless Tobacco Never Used   Counseling given: Not Answered   Outpatient Medications Prior to Visit  Medication Sig Dispense Refill  . azaTHIOprine (IMURAN) 50 MG tablet Take  1 tablet (50 mg) by mouth daily for 2 weeks then increase to 2 tablets (188m) daily. 180 tablet 0  . benzonatate (TESSALON) 200 MG capsule Take 1 capsule (200 mg total) by mouth 3 (three) times daily as needed for cough. 30 capsule 1  . carvedilol (COREG) 6.25 MG tablet Take 6.25 mg by mouth 2 (two) times daily with a meal.    . clindamycin (CLEOCIN T) 1 % lotion     . furosemide (LASIX) 40 MG tablet Take 40 mg by mouth See admin instructions. Take 1  tablet (40 mg) daily, may repeat dose if needed for fluid retention.    . gabapentin (NEURONTIN) 100 MG capsule Take 1 capsule by mouth as needed.    . magnesium oxide (MAG-OX) 400 MG tablet Take 1 tablet (400 mg total) by mouth daily. 30 tablet 5  . metFORMIN (GLUCOPHAGE) 500 MG tablet Take 500 mg by mouth 2 (two) times daily.     . montelukast (SINGULAIR) 10 MG tablet Take 10 mg by mouth daily after supper.    .Marland KitchenOFEV 150 MG CAPS Take 1 capsule (150 mg total) by mouth 2 (two) times daily. 60 capsule 11  . ondansetron (ZOFRAN) 8 MG tablet TAKE 1 TABLET BY MOUTH EVERY 8 HOURS AS NEEDED FOR NAUSEA AND VOMITING 45 tablet 2  . Pirfenidone (ESBRIET) 267 MG TABS Take 1 tablet (267 mg total) by mouth 3 (three) times daily for 7 days, THEN 2 tablets (534 mg total) 3 (three) times daily for 7 days, THEN 3 tablets (801 mg total) 3 (three) times daily for 16 days. 207 tablet 0  . potassium chloride (KLOR-CON) 10 MEQ tablet TAKE 1 TABLET BY MOUTH 2 TIMES A DAY 120 tablet 2  . predniSONE (DELTASONE) 10 MG tablet Take 10-22mas needed for SOB 30 tablet 3  . PROAIR HFA 108 (90 Base) MCG/ACT inhaler Inhale 2 puffs into the lungs every 6 (six) hours as needed for wheezing or shortness of breath. 18 g 5  . saccharomyces boulardii (FLORASTOR) 250 MG capsule Take 1 capsule (250 mg total) by mouth 2 (two) times daily. 60 capsule 0  . spironolactone (ALDACTONE) 100 MG tablet Take 100 mg by mouth daily.    . Marland Kitchenulfamethoxazole-trimethoprim (BACTRIM) 400-80 MG tablet Take 1 tablet by mouth every Monday, Wednesday, and Friday.    . thyroid (ARMOUR) 90 MG tablet Take 90 mg by mouth daily before breakfast.     . Vitamin D, Ergocalciferol, (DRISDOL) 1.25 MG (50000 UT) CAPS capsule TAKE 1 CAPSULE BY MOUTH ONCE WEEKLY 3 capsule 0  . Magnesium Oxide 400 (240 Mg) MG TABS Take 1 tablet by mouth daily.     No facility-administered medications prior to visit.     Review of Systems:   Constitutional:   No  weight loss, night  sweats,  Fevers, chills, + fatigue, or  lassitude.  HEENT:   No headaches,  Difficulty swallowing,  Tooth/dental problems, or  Sore throat,                No sneezing, itching, ear ache, nasal congestion, post nasal drip,   CV:  No chest pain,  Orthopnea, PND, swelling in lower extremities, anasarca, dizziness, palpitations, syncope.   GI  No heartburn, indigestion, abdominal pain, nausea, vomiting, diarrhea, change in bowel habits, loss of appetite, bloody stools.   Resp: .  No chest wall deformity  Skin: no rash or lesions.  GU: no dysuria, change in color of urine, no urgency or  frequency.  No flank pain, no hematuria   MS:  No joint pain or swelling.  No decreased range of motion.  No back pain.    Physical Exam  BP 110/68 (BP Location: Right Arm, Patient Position: Sitting, Cuff Size: Large)   Pulse 100   Temp 98.2 F (36.8 C) (Temporal)   Ht _0  (1.575 m)   Wt 223 lb (101.2 kg)   SpO2 95% Comment: 8 L/MIN  BMI 40.79 kg/m   GEN: A/Ox3; pleasant , NAD, BMI 40    HEENT:  [place]/AT, , NOSE-clear, THROAT-clear, no lesions, no postnasal drip or exudate noted.   NECK:  Supple w/ fair ROM; no JVD; normal carotid impulses w/o bruits; no thyromegaly or nodules palpated; no lymphadenopathy.    RESP  BB crackles  no accessory muscle use, no dullness to percussion  CARD:  RRR, no m/r/g, no peripheral edema, pulses intact, no cyanosis or clubbing.  GI:   Soft & nt; nml bowel sounds; no organomegaly or masses detected.   Musco: Warm bil, no deformities or joint swelling noted.   Neuro: alert, no focal deficits noted.    Skin: Warm, no lesions or rashes    Lab Results:  CBC    Component Value Date/Time   WBC 9.6 05/28/2019 1442   RBC 4.05 05/28/2019 1442   HGB 12.3 05/28/2019 1442   HGB 11.1 02/13/2018 1118   HCT 37.7 05/28/2019 1442   HCT 37.2 02/13/2018 1118   PLT 301.0 05/28/2019 1442   MCV 93.1 05/28/2019 1442   MCV 81 02/13/2018 1118   MCH 31.0 01/17/2019  0805   MCHC 32.5 05/28/2019 1442   RDW 14.6 05/28/2019 1442   RDW 15.6 (H) 02/13/2018 1118   LYMPHSABS 1.0 05/28/2019 1442   LYMPHSABS 0.8 02/13/2018 1118   MONOABS 0.6 05/28/2019 1442   EOSABS 0.0 05/28/2019 1442   EOSABS 0.2 02/13/2018 1118   BASOSABS 0.0 05/28/2019 1442   BASOSABS 0.1 02/13/2018 1118    BMET    Component Value Date/Time   NA 135 05/28/2019 1548   NA 136 02/13/2018 1118   K 3.8 05/28/2019 1548   CL 89 (L) 05/28/2019 1548   CO2 33 (H) 05/28/2019 1548   GLUCOSE 155 (H) 05/28/2019 1548   BUN 16 05/28/2019 1548   BUN 11 02/13/2018 1118   CREATININE 0.95 05/28/2019 1548   CALCIUM 9.5 05/28/2019 1548   GFRNONAA >60 01/17/2019 0805   GFRAA >60 01/17/2019 0805    BNP No results found for: BNP  ProBNP    Component Value Date/Time   PROBNP 32.0 02/08/2019 1029    Imaging: ECHOCARDIOGRAM COMPLETE  Result Date: 05/14/2019    ECHOCARDIOGRAM REPORT   Patient Name:   RALPH BENAVIDEZ Bouyer Date of Exam: 05/14/2019 Medical Rec #:  [id number]         Height:       62.0 in Accession #:    [id number]        Weight:       220.6 lb Date of Birth:  07/23/1970          BSA:          1.993 m Patient Age:    48 years          BP:           110/70 mmHg Patient Gender: F                 HR:  99 bpm. Exam Location:  Outpatient Procedure: 2D Echo Indications:    Dyspnea R06.00  History:        Patient has prior history of Echocardiogram examinations, most                 recent 11/09/2017. Interstitial Lung Disease; Risk                 Factors:Hypertension and Diabetes.  Sonographer:    Mikki Santee RDCS (AE) Referring Phys: [place]  1. Left ventricular ejection fraction, by estimation, is 55 to 60%. The left ventricle has normal function. Left ventricular endocardial border not optimally defined to evaluate regional wall motion. Left ventricular diastolic parameters are consistent with Grade I diastolic dysfunction (impaired relaxation). Elevated left  ventricular end-diastolic pressure. The average left ventricular global longitudinal strain is -15.0 %.  2. Right ventricular systolic function was not well visualized. The right ventricular size is normal. Tricuspid regurgitation signal is inadequate for assessing PA pressure.  3. The mitral valve is normal in structure. No evidence of mitral valve regurgitation. No evidence of mitral stenosis.  4. The aortic valve is normal in structure. Aortic valve regurgitation is not visualized. No aortic stenosis is present.  5. The inferior vena cava is normal in size with greater than 50% respiratory variability, suggesting right atrial pressure of 3 mmHg. FINDINGS  Left Ventricle: Left ventricular ejection fraction, by estimation, is 55 to 60%. The left ventricle has normal function. Left ventricular endocardial border not optimally defined to evaluate regional wall motion. The average left ventricular global longitudinal strain is -15.0 %. The left ventricular internal cavity size was normal in size. There is no left ventricular hypertrophy. Left ventricular diastolic parameters are consistent with Grade I diastolic dysfunction (impaired relaxation). Elevated left ventricular end-diastolic pressure. Right Ventricle: The right ventricular size is normal. Right vetricular wall thickness was not assessed. Right ventricular systolic function was not well visualized. Tricuspid regurgitation signal is inadequate for assessing PA pressure. Left Atrium: Left atrial size was normal in size. Right Atrium: Right atrial size was normal in size. Pericardium: Trivial pericardial effusion is present. The pericardial effusion is anterior to the right ventricle. Mitral Valve: The mitral valve is normal in structure. Normal mobility of the mitral valve leaflets. No evidence of mitral valve regurgitation. No evidence of mitral valve stenosis. Tricuspid Valve: The tricuspid valve is normal in structure. Tricuspid valve regurgitation is not  demonstrated. No evidence of tricuspid stenosis. Aortic Valve: The aortic valve is normal in structure. Aortic valve regurgitation is not visualized. No aortic stenosis is present. Pulmonic Valve: The pulmonic valve was normal in structure. Pulmonic valve regurgitation is not visualized. No evidence of pulmonic stenosis. Aorta: The aortic root is normal in size and structure. Venous: The inferior vena cava is normal in size with greater than 50% respiratory variability, suggesting right atrial pressure of 3 mmHg. IAS/Shunts: No atrial level shunt detected by color flow Doppler.  LEFT VENTRICLE PLAX 2D LVIDd:         4.70 cm  Diastology LVIDs:         3.10 cm  LV e' lateral:   6.64 cm/s LV PW:         1.10 cm  LV E/e' lateral: 10.0 LV IVS:        1.10 cm  LV e' medial:    4.13 cm/s LVOT diam:     1.80 cm  LV E/e' medial:  16.1 LV SV:  27 LV SV Index:   14       2D Longitudinal Strain LVOT Area:     2.54 cm 2D Strain GLS (A2C):   -15.1 %                         2D Strain GLS (A3C):   -17.6 %                         2D Strain GLS (A4C):   -12.4 %                         2D Strain GLS Avg:     -15.0 % RIGHT VENTRICLE RV S prime:     10.00 cm/s TAPSE (M-mode): 1.5 cm LEFT ATRIUM             Index      RIGHT ATRIUM          Index LA diam:        3.30 cm 1.66 cm/m RA Area:     7.88 cm LA Vol (A2C):   17.1 ml 8.58 ml/m RA Volume:   13.10 ml 6.57 ml/m LA Vol (A4C):   8.1 ml  4.07 ml/m LA Biplane Vol: 12.4 ml 6.22 ml/m  AORTIC VALVE LVOT Vmax:   68.50 cm/s LVOT Vmean:  43.200 cm/s LVOT VTI:    0.107 m  AORTA Ao Root diam: 2.80 cm MITRAL VALVE MV Area (PHT): 4.63 cm    SHUNTS MV Decel Time: 164 msec    Systemic VTI:  0.11 m MV E velocity: 66.40 cm/s  Systemic Diam: 1.80 cm MV A velocity: 88.30 cm/s MV E/A ratio:  0.75 Fransico Him MD Electronically signed by Fransico Him MD Signature Date/Time: 05/14/2019/10:27:24 PM    Final       PFT Results Latest Ref Rng & Units 11/20/2018 03/30/2018  FVC-Pre L 1.16 1.09    FVC-Predicted Pre % 35 32  Pre FEV1/FVC % % 91 93  FEV1-Pre L 1.06 1.01  FEV1-Predicted Pre % 40 38  DLCO UNC% % 37 34  DLCO COR %Predicted % 93 92    No results found for: NITRICOXIDE      Assessment & Plan:   No problem-specific Assessment & Plan notes found for this encounter.     Rexene Edison, NP 05/28/2019

## 2019-05-28 NOTE — Assessment & Plan Note (Signed)
Progressive ILD - stable on present regimen  Doing well in pulmonary rehab.  Labs today   Plan  Patient Instructions  Saline nasal spray and gel As needed   Continue on Imuran 144m daily  Continue on Ofev 150 mg twice daily Continue on prednisone 10 mg daily Esbriet as directed.  Continue on Bactrim M/W/F .  Continue on oxygen-goal is to keep oxygen levels greater than 88 to 90% Pulmonary rehab  Work on healthy weight loss.  Follow-up in 6-8  weeks with Dr. RChase Calleror Herschel Fleagle NP and As needed   Please contact office for sooner follow up if symptoms do not improve or worsen or seek emergency care

## 2019-05-28 NOTE — Patient Instructions (Addendum)
Saline nasal spray and gel As needed   Continue on Imuran 175m daily  Continue on Ofev 150 mg twice daily Continue on prednisone 10 mg daily Esbriet as directed.  Continue on Bactrim M/W/F .  Continue on oxygen-goal is to keep oxygen levels greater than 88 to 90% Pulmonary rehab  Work on healthy weight loss.  Follow-up in 6-8  weeks with Dr. RChase Calleror Zarayah Lanting NP and As needed   Please contact office for sooner follow up if symptoms do not improve or worsen or seek emergency care

## 2019-05-28 NOTE — Assessment & Plan Note (Signed)
Healthy weight loss discussed.

## 2019-05-28 NOTE — Research (Signed)
Title: Chronic Fibrosing Interstitial Lung Disease with Progressive Phenotype Prospective Outcomes (ILD-PRO) Registry   Protocol #: IPF-[id number], Clinical Trials # [id number], Sponsor: [employer]  Protocol Version Amendment 4 dated 12Sep2019  and confirmed yes current on 05/28/2019 Consent Version for today's visit date of 05/28/2019  is Version [id number]  Clinical Research Coordinator / Research RN note : This visit for Subject Tasha Ballard with DOB: [birth year] on 05/28/2019 for the above protocol is Visit/Encounter # 18 Month (Visit 3) and is for purpose of research. The consent for this encounter is under Protocol Version Amendment 4 [phone]) and is currently IRB approved. Subject expressed continued interest and consent in continuing as a study subject. Subject confirmed that there were no changes in contact information (e.g. address, telephone, email). Subject thanked for participation in research and contribution to science. Refer to the subject's paper source binder for additional information on this visit.   Signed by  [place] Assistant [facility]  [place], [place] 2:13 PM 05/28/2019

## 2019-05-29 ENCOUNTER — Ambulatory Visit (HOSPITAL_COMMUNITY)
Admission: RE | Admit: 2019-05-29 | Discharge: 2019-05-29 | Disposition: A | Discharge status: Home / Self Care | Payer: BC Managed Care – PPO | Source: Ambulatory Visit | Attending: Internal Medicine | Admitting: Internal Medicine

## 2019-05-29 ENCOUNTER — Telehealth: Payer: Self-pay | Admitting: Internal Medicine

## 2019-05-29 ENCOUNTER — Encounter (HOSPITAL_COMMUNITY)
Admission: RE | Admit: 2019-05-29 | Discharge: 2019-05-29 | Disposition: A | Discharge status: Home / Self Care | Payer: BC Managed Care – PPO | Source: Ambulatory Visit | Attending: Internal Medicine | Admitting: Internal Medicine

## 2019-05-29 VITALS — Wt 224.9 lb

## 2019-05-29 DIAGNOSIS — J84112 Idiopathic pulmonary fibrosis: Secondary | ICD-10-CM | POA: Insufficient documentation

## 2019-05-29 DIAGNOSIS — Z452 Encounter for adjustment and management of vascular access device: Secondary | ICD-10-CM | POA: Insufficient documentation

## 2019-05-29 DIAGNOSIS — J849 Interstitial pulmonary disease, unspecified: Secondary | ICD-10-CM

## 2019-05-29 MED ORDER — HEPARIN SOD (PORK) LOCK FLUSH 100 UNIT/ML IV SOLN
500.0000 [IU] | INTRAVENOUS | Status: AC | PRN
  Administered 2019-05-29: 14:00:00 500 [IU]
  Filled 2019-05-29: qty 5

## 2019-05-29 MED ORDER — SODIUM CHLORIDE 0.9% FLUSH
10.0000 mL | INTRAVENOUS | Status: AC | PRN
  Administered 2019-05-29: 10 mL

## 2019-05-29 NOTE — Progress Notes (Signed)
Daily Session Note  Patient Details  Name: Tasha Ballard MRN: [id number] Date of Birth: 22-Dec-1970 Referring Provider:     Pulmonary Rehab Walk Test from 01/11/2019 in [employer]  Referring Provider  Dr. [name]      Encounter Date: 05/29/2019  Check In: Session Check In - 05/29/19 1124      Check-In   Supervising physician immediately available to respond to emergencies  [facility] immediately available    Physician(s)  Dr. Broadus John    Location  [facility]    Staff Present  Rosebud Poles, RN, Bjorn Loser, MS, Exercise Physiologist;Lisa Ysidro Evert, RN    Virtual Visit  No    Medication changes reported      No    Fall or balance concerns reported     No    Tobacco Cessation  No Change    Warm-up and Cool-down  Performed as group-led instruction    Resistance Training Performed  Yes    VAD Patient?  No    PAD/SET Patient?  No      Pain Assessment   Currently in Pain?  No/denies    Multiple Pain Sites  No       Capillary Blood Glucose: Results for orders placed or performed in visit on 05/28/19 (from the past 24 hour(s))  CBC with Differential/Platelet     Status: Abnormal   Collection Time: 05/28/19  2:42 PM  Result Value Ref Range   WBC 9.6 4.0 - 10.5 K/uL   RBC 4.05 3.87 - 5.11 Mil/uL   Hemoglobin 12.3 12.0 - 15.0 g/dL   HCT 37.7 36.0 - 46.0 %   MCV 93.1 78.0 - 100.0 fl   MCHC 32.5 30.0 - 36.0 g/dL   RDW 14.6 11.5 - 15.5 %   Platelets 301.0 150.0 - 400.0 K/uL   Neutrophils Relative % 82.2 (H) 43.0 - 77.0 %   Lymphocytes Relative 10.7 (L) 12.0 - 46.0 %   Monocytes Relative 6.3 3.0 - 12.0 %   Eosinophils Relative 0.5 0.0 - 5.0 %   Basophils Relative 0.3 0.0 - 3.0 %   Neutro Abs 7.9 (H) 1.4 - 7.7 K/uL   Lymphs Abs 1.0 0.7 - 4.0 K/uL   Monocytes Absolute 0.6 0.1 - 1.0 K/uL   Eosinophils Absolute 0.0 0.0 - 0.7 K/uL   Basophils Absolute 0.0 0.0 - 0.1 K/uL  Comp Met (CMET)     Status: Abnormal    Collection Time: 05/28/19  3:48 PM  Result Value Ref Range   Sodium 135 135 - 145 mEq/L   Potassium 3.8 3.5 - 5.1 mEq/L   Chloride 89 (L) 96 - 112 mEq/L   CO2 33 (H) 19 - 32 mEq/L   Glucose, Bld 155 (H) 70 - 99 mg/dL   BUN 16 6 - 23 mg/dL   Creatinine, Ser 0.95 0.40 - 1.20 mg/dL   Total Bilirubin 0.3 0.2 - 1.2 mg/dL   Alkaline Phosphatase 46 39 - 117 U/L   AST 16 0 - 37 U/L   ALT 18 0 - 35 U/L   Total Protein 6.7 6.0 - 8.3 g/dL   Albumin 4.1 3.5 - 5.2 g/dL   GFR 62.08 >60.00 mL/min   Calcium 9.5 8.4 - 10.5 mg/dL   POCT Glucose - 05/29/19 1143      POCT Blood Glucose   Pre-Exercise  143 mg/dL    Post-Exercise  133 mg/dL      Exercise Prescription Changes - 05/29/19  1100      Response to Exercise   Blood Pressure (Admit)  100/60    Blood Pressure (Exercise)  102/70    Blood Pressure (Exit)  102/60    Heart Rate (Admit)  95 bpm    Heart Rate (Exercise)  110 bpm    Heart Rate (Exit)  102 bpm    Oxygen Saturation (Admit)  99 %    Oxygen Saturation (Exercise)  97 %    Oxygen Saturation (Exit)  99 %    Rating of Perceived Exertion (Exercise)  12    Perceived Dyspnea (Exercise)  3    Duration  Continue with 30 min of aerobic exercise without signs/symptoms of physical distress.    Intensity  THRR unchanged      Progression   Progression  Continue to progress workloads to maintain intensity without signs/symptoms of physical distress.      Resistance Training   Training Prescription  Yes    Weight  orange bands    Reps  10-15    Time  10 Minutes      Oxygen   Oxygen  Continuous    Liters  8-10      Treadmill   MPH  1.7    Grade  1    Minutes  15      NuStep   Level  3    SPM  80    Minutes  15    METs  1.6       Social History   Tobacco Use  Smoking Status Never Smoker  Smokeless Tobacco Never Used    Goals Met:  Independence with exercise equipment Exercise tolerated well Strength training completed today  Goals Unmet:  Not  Applicable  Comments: Service time is from 1030 to 1120    Dr. Fransico Him is Medical Director for Cardiac Rehab at [facility].

## 2019-05-29 NOTE — Telephone Encounter (Signed)
Order placed for maintenance rehab.

## 2019-05-29 NOTE — Progress Notes (Signed)
PATIENT CARE CENTER NOTE  Provider: Rexene Edison, NP   Procedure: Port-a- cath flush   Note: Patient's implanted port accessed and de-accessed using sterile technique. Flushed with heparin 500 units and 10 cc, 0.9 % sodium chloride. Patient alert, oriented and ambulatory at discharge.

## 2019-05-31 ENCOUNTER — Other Ambulatory Visit: Payer: Self-pay | Admitting: Internal Medicine

## 2019-05-31 ENCOUNTER — Encounter (HOSPITAL_COMMUNITY)
Admission: RE | Admit: 2019-05-31 | Discharge: 2019-05-31 | Disposition: A | Discharge status: Home / Self Care | Payer: BC Managed Care – PPO | Source: Ambulatory Visit | Attending: Internal Medicine | Admitting: Internal Medicine

## 2019-05-31 ENCOUNTER — Other Ambulatory Visit: Payer: Self-pay

## 2019-05-31 ENCOUNTER — Telehealth: Payer: Self-pay | Admitting: Internal Medicine

## 2019-05-31 DIAGNOSIS — J849 Interstitial pulmonary disease, unspecified: Secondary | ICD-10-CM

## 2019-05-31 NOTE — Telephone Encounter (Signed)
Pt was made aware of her lab results and also did cancel the PET scan.

## 2019-05-31 NOTE — Progress Notes (Signed)
Daily Session Note  Patient Details  Name: LOURETTA TANTILLO MRN: [id number] Date of Birth: [birth year] Referring Provider:     Pulmonary Rehab Walk Test from 01/11/2019 in [place]  Referring Provider  Dr. [name]      Encounter Date: 05/31/2019  Check In: Session Check In - 05/31/19 1135      Check-In   Supervising physician immediately available to respond to emergencies  [facility] immediately available    Physician(s)  Dr. Broadus John    Location  [facility]    Staff Present  Rosebud Poles, RN, Bjorn Loser, MS, Exercise Physiologist;Lisa Ysidro Evert, RN    Virtual Visit  No    Medication changes reported      No    Fall or balance concerns reported     No    Tobacco Cessation  No Change    Warm-up and Cool-down  Performed on first and last piece of equipment    Resistance Training Performed  Yes    VAD Patient?  No    PAD/SET Patient?  No      Pain Assessment   Currently in Pain?  No/denies    Pain Score  0-No pain    Multiple Pain Sites  No       Capillary Blood Glucose: No results found for this or any previous visit (from the past 24 hour(s)).    Social History   Tobacco Use  Smoking Status Never Smoker  Smokeless Tobacco Never Used    Goals Met:  Independence with exercise equipment Exercise tolerated well Strength training completed today  Goals Unmet:  Not Applicable  Comments: Service time is from 1035 to 1122    Dr. Fransico Him is Medical Director for Cardiac Rehab at [facility].

## 2019-06-01 ENCOUNTER — Telehealth: Payer: Self-pay | Admitting: Internal Medicine

## 2019-06-01 ENCOUNTER — Ambulatory Visit (HOSPITAL_COMMUNITY): Admission: RE | Admit: 2019-06-01 | Payer: BC Managed Care – PPO | Source: Ambulatory Visit

## 2019-06-01 DIAGNOSIS — R911 Solitary pulmonary nodule: Secondary | ICD-10-CM

## 2019-06-01 NOTE — Telephone Encounter (Signed)
Called and spoke with patient she hasn't heard about the lab that was sent out.   Might be coming back in the mail. EP anything in MR's mailbox?  Also patient received letter from insurance stating they would not pay for this lab, MR please advise if the results will change this decision from her insurance company

## 2019-06-04 NOTE — Telephone Encounter (Signed)
Results were given to MR today 4/12 in the blue folder. Should be on MR's desk along with other papers that were given to him today for review and sign.

## 2019-06-04 NOTE — Telephone Encounter (Signed)
Is the [facility] lab work. Please get it to my desk. It was sent out before my 2 week vacation

## 2019-06-05 ENCOUNTER — Telehealth (HOSPITAL_COMMUNITY): Payer: Self-pay | Admitting: Family Medicine

## 2019-06-05 ENCOUNTER — Encounter (HOSPITAL_COMMUNITY): Payer: BC Managed Care – PPO

## 2019-06-07 ENCOUNTER — Encounter (HOSPITAL_COMMUNITY): Payer: BC Managed Care – PPO

## 2019-06-08 NOTE — Telephone Encounter (Signed)
No cancer antibodies detected on the [facility] blood work. So, overall prob that the nodule is cancer is low (but not zero and we can only say low prob, mod prob or high prob).  Plan  - I do not recommend PET scan  - she can do followup CT - ideally -> early June 2021 but if she is anxious can do early may 2021

## 2019-06-08 NOTE — Telephone Encounter (Signed)
Order has been placed for CT scan.

## 2019-06-08 NOTE — Telephone Encounter (Signed)
That's fine

## 2019-06-08 NOTE — Telephone Encounter (Signed)
Spoke with pt, aware of results/recs.  Pt ok with waiting until early June for CT.    MR please advise- it appears that all of pt's previous Ct's have been HRCT's.  Do you wish to order another HRCT?  Thanks!

## 2019-06-12 ENCOUNTER — Other Ambulatory Visit: Payer: Self-pay

## 2019-06-12 ENCOUNTER — Encounter (HOSPITAL_COMMUNITY)
Admission: RE | Admit: 2019-06-12 | Discharge: 2019-06-12 | Disposition: A | Discharge status: Home / Self Care | Payer: BC Managed Care – PPO | Source: Ambulatory Visit | Attending: Internal Medicine | Admitting: Internal Medicine

## 2019-06-12 NOTE — Progress Notes (Signed)
Daily Session Note  Patient Details  Name: Tasha Ballard MRN: [id number] Date of Birth: 07/04/1970 Referring Provider:     Pulmonary Rehab Walk Test from 01/11/2019 in [place]  Referring Provider  Dr. [name]      Encounter Date: 06/12/2019  Check In: Session Check In - 06/12/19 1054      Check-In   Supervising physician immediately available to respond to emergencies  [facility] immediately available    Physician(s)  Dr. Tawanna Solo    Location  [facility]    Staff Present  Rosebud Poles, RN, Bjorn Loser, MS, Exercise Physiologist;Lisa Ysidro Evert, RN    Virtual Visit  No    Medication changes reported      No    Fall or balance concerns reported     No    Tobacco Cessation  No Change    Warm-up and Cool-down  Performed as group-led instruction    Resistance Training Performed  Yes    VAD Patient?  No    PAD/SET Patient?  No      Pain Assessment   Currently in Pain?  No/denies    Multiple Pain Sites  No       Capillary Blood Glucose: No results found for this or any previous visit (from the past 24 hour(s)).  Exercise Prescription Changes - 06/12/19 1100      Response to Exercise   Blood Pressure (Admit)  110/80    Blood Pressure (Exit)  100/70    Heart Rate (Admit)  94 bpm    Heart Rate (Exercise)  106 bpm    Heart Rate (Exit)  103 bpm    Oxygen Saturation (Admit)  99 %    Oxygen Saturation (Exercise)  98 %    Oxygen Saturation (Exit)  99 %    Rating of Perceived Exertion (Exercise)  12    Perceived Dyspnea (Exercise)  3    Duration  Continue with 30 min of aerobic exercise without signs/symptoms of physical distress.    Intensity  THRR unchanged      Progression   Progression  Continue to progress workloads to maintain intensity without signs/symptoms of physical distress.      Resistance Training   Training Prescription  Yes    Weight  orange bands    Reps  10-15    Time  10 Minutes      Oxygen   Oxygen  Continuous    Liters  8-10      Treadmill   MPH  --   final walk test     NuStep   Level  3    SPM  80    Minutes  15    METs  1.9       Social History   Tobacco Use  Smoking Status Never Smoker  Smokeless Tobacco Never Used    Goals Met:  Proper associated with RPD/PD & O2 Sat Exercise tolerated well Strength training completed today  Goals Unmet:  Not Applicable  Comments: Service time is from 1030 to 1125    Dr. Fransico Him is Medical Director for Cardiac Rehab at [facility].

## 2019-06-13 ENCOUNTER — Telehealth: Payer: Self-pay | Admitting: Internal Medicine

## 2019-06-13 DIAGNOSIS — J84111 Idiopathic interstitial pneumonia, not otherwise specified: Secondary | ICD-10-CM

## 2019-06-13 DIAGNOSIS — Z79899 Other long term (current) drug therapy: Secondary | ICD-10-CM

## 2019-06-13 DIAGNOSIS — J849 Interstitial pulmonary disease, unspecified: Secondary | ICD-10-CM

## 2019-06-13 MED ORDER — ESBRIET 801 MG PO TABS: 1.0000 | ORAL_TABLET | Freq: Three times a day (TID) | ORAL | 1 refills | Status: DC

## 2019-06-13 MED FILL — ESBRIET 801 MG TABS: 801 | 30 days supply | Qty: 90 | Fill #0

## 2019-06-13 NOTE — Telephone Encounter (Signed)
Called and spoke with Patient.  Patient requested a new prescription for Esbriet to be sent to [facility].   Patient stated she called for refill and was told to contact office. Patient stated she is taking 3 tabs, three times a day.  Will route to pharmacy team for correct dosage and directions

## 2019-06-13 NOTE — Telephone Encounter (Signed)
Last CMP showed normal LFTs.  Prescription sent to [facility] for maintenance dose of one 801 mg tablet 3 times daily with food.  She will be due for repeat hepatic panel around 06/27/2019.  Future orders placed.  MyChart message sent to notify patient.   Mariella Saa, PharmD, [place], [place] Clinical Specialty Pharmacist [phone]  06/13/2019 1:23 PM

## 2019-06-14 ENCOUNTER — Encounter (HOSPITAL_COMMUNITY): Payer: BC Managed Care – PPO

## 2019-06-19 ENCOUNTER — Other Ambulatory Visit: Payer: Self-pay

## 2019-06-19 ENCOUNTER — Encounter (HOSPITAL_COMMUNITY)
Admission: RE | Admit: 2019-06-19 | Discharge: 2019-06-19 | Disposition: A | Discharge status: Home / Self Care | Payer: BC Managed Care – PPO | Source: Ambulatory Visit | Attending: Internal Medicine | Admitting: Internal Medicine

## 2019-06-19 NOTE — Progress Notes (Signed)
Tasha Ballard presented today for their first day of exercise at Pulmonary maintenance.   Entrance Vitals Blood Pressure: 140/70 Heart Rate: 99 Oxygen Saturation: 100 6L  Exercising Vitals Blood Pressure: 130/80 Heart Rate: 99 10L Oxygen Saturation: 107  Exit Vitals Blood Pressure: 150/88 Heart Rate: 99 Oxygen Saturation: 95 6L   Fall Risk: No  GOALS MET Exercise tolerated well No report of cardiac concerns or symptoms Strength training completed today  GOALS UNMET Not Applicable   Additional Comments: Service time is from 0830 to [id number]

## 2019-06-21 ENCOUNTER — Encounter (HOSPITAL_COMMUNITY): Payer: Self-pay | Attending: Internal Medicine

## 2019-06-21 ENCOUNTER — Telehealth (HOSPITAL_COMMUNITY): Payer: Self-pay | Admitting: Family Medicine

## 2019-06-21 DIAGNOSIS — J84112 Idiopathic pulmonary fibrosis: Secondary | ICD-10-CM | POA: Insufficient documentation

## 2019-06-21 NOTE — Progress Notes (Signed)
Discharge Progress Report  Patient Details  Name: Tasha Ballard MRN: [id number] Date of Birth: 03/14/1970 Referring Provider:     Pulmonary Rehab Walk Test from 01/11/2019 in [facility]  Referring Provider  Dr. Ramaswamy       Number of Visits: 19  Reason for Discharge:  Patient reached a stable level of exercise. Patient independent in their exercise. Patient has met program and personal goals.  Smoking History:  Social History   Tobacco Use  Smoking Status Never Smoker  Smokeless Tobacco Never Used    Diagnosis:  No diagnosis found.  ADL UCSD: Pulmonary Assessment Scores    Row Name 01/11/19 1555 06/12/19 1151       ADL UCSD   ADL Phase  Entry  Exit    SOB Score total  64  --      CAT Score   CAT Score  25  --      mMRC Score   mMRC Score  --  4       Initial Exercise Prescription: Initial Exercise Prescription - 01/11/19 1600      Date of Initial Exercise RX and Referring Provider   Date  01/11/19    Referring Provider  Dr. Ramaswamy      Oxygen   Oxygen  Continuous    Liters  8-10      Treadmill   MPH  1.6    Grade  1    Minutes  15      NuStep   Level  2    SPM  80    Minutes  15      Prescription Details   Frequency (times per week)  2    Duration  Progress to 30 minutes of continuous aerobic without signs/symptoms of physical distress      Intensity   THRR 40-80% of Max Heartrate  68-136    Ratings of Perceived Exertion  11-13    Perceived Dyspnea  0-4      Progression   Progression  Continue progressive overload as per policy without signs/symptoms or physical distress.      Resistance Training   Training Prescription  Yes    Weight  orange bands    Reps  10-15       Discharge Exercise Prescription (Final Exercise Prescription Changes): Exercise Prescription Changes - 06/12/19 1100      Response to Exercise   Blood Pressure (Admit)  110/80    Blood Pressure (Exit)  100/70    Heart  Rate (Admit)  94 bpm    Heart Rate (Exercise)  106 bpm    Heart Rate (Exit)  103 bpm    Oxygen Saturation (Admit)  99 %    Oxygen Saturation (Exercise)  98 %    Oxygen Saturation (Exit)  99 %    Rating of Perceived Exertion (Exercise)  12    Perceived Dyspnea (Exercise)  3    Duration  Continue with 30 min of aerobic exercise without signs/symptoms of physical distress.    Intensity  THRR unchanged      Progression   Progression  Continue to progress workloads to maintain intensity without signs/symptoms of physical distress.      Resistance Training   Training Prescription  Yes    Weight  orange bands    Reps  10-15    Time  10 Minutes      Oxygen   Oxygen  Continuous    Liters  8-10        Treadmill   MPH  --   final walk test     NuStep   Level  3    SPM  80    Minutes  15    METs  1.9       Functional Capacity: 6 Minute Walk    Row Name 01/11/19 1620 06/12/19 1151       6 Minute Walk   Phase  Initial  Discharge    Distance  915 feet  1089 feet    Distance % Change  --  19.02 %    Distance Feet Change  --  179 ft    Walk Time  5.58 minutes  6 minutes    # of Rest Breaks  1 standing rest 25 sec due to desaturation  0    MPH  1.73  2.06    METS  3.12  3.51    RPE  13  14    Perceived Dyspnea   3  1    VO2 Peak  10.92  12.3    Symptoms  No  Yes (comment)    Comments  --  nausea from medications    Resting HR  99 bpm  94 bpm    Resting BP  112/74  110/80    Resting Oxygen Saturation   99 %  99 %    Exercise Oxygen Saturation  during 6 min walk  84 %  85 %    Max Ex. HR  159 bpm  140 bpm    Max Ex. BP  154/72  152/70    2 Minute Post BP  126/80  110/80      Interval HR   1 Minute HR  159  118    2 Minute HR  134  118    3 Minute HR  135  114    4 Minute HR  123  113    5 Minute HR  125  119    6 Minute HR  131  140    2 Minute Post HR  111  106    Interval Heart Rate?  Yes  Yes      Interval Oxygen   Interval Oxygen?  Yes  Yes    Baseline Oxygen  Saturation %  99 %  99 %    1 Minute Oxygen Saturation %  92 %  90 %    1 Minute Liters of Oxygen  6 L  10 L    2 Minute Oxygen Saturation %  87 %  90 %    2 Minute Liters of Oxygen  6 L  10 L    3 Minute Oxygen Saturation %  84 %  88 %    3 Minute Liters of Oxygen  6 L  10 L    4 Minute Oxygen Saturation %  91 %  85 %    4 Minute Liters of Oxygen  8 L  10 L    5 Minute Oxygen Saturation %  88 %  85 %    5 Minute Liters of Oxygen  8 L  10 L    6 Minute Oxygen Saturation %  86 %  85 %    6 Minute Liters of Oxygen  8 L  10 L    2 Minute Post Oxygen Saturation %  96 %  100 %    2 Minute Post Liters of Oxygen  6 L  0 L         Psychological, QOL, Others - Outcomes: PHQ 2/9: Depression screen [facility] 2/9 01/11/2019 02/13/2018 12/22/2017 08/10/2017  Decreased Interest 0 _0 Down, Depressed, Hopeless 0 1 0 1  PHQ - 2 Score 0 _1 Altered sleeping _2 Tired, decreased energy _3 Change in appetite _4 Feeling bad or failure about yourself  0 _5 Trouble concentrating 0 _6 Moving slowly or fidgety/restless 0 1 0 0  Suicidal thoughts 0 0 0 0  PHQ-9 Score - _7 Difficult doing work/chores Not difficult at all Very difficult Somewhat difficult Very difficult    Quality of Life:   Personal Goals: Goals established at orientation with interventions provided to work toward goal. Personal Goals and Risk Factors at Admission - 01/11/19 1507      Core Components/Risk Factors/Patient Goals on Admission    Weight Management  Yes;Weight Loss    Admit Weight  224 lb 13.9 oz (102 kg)    Goal Weight: Short Term  220 lb 7.4 oz (100 kg)    Goal Weight: Long Term  180 lb (81.6 kg)    Improve shortness of breath with ADL's  Yes    Intervention  Provide education, individualized exercise plan and daily activity instruction to help decrease symptoms of SOB with activities of daily living.    Expected Outcomes  Short Term: Improve cardiorespiratory fitness to achieve a  reduction of symptoms when performing ADLs;Long Term: Be able to perform more ADLs without symptoms or delay the onset of symptoms        Personal Goals Discharge: Goals and Risk Factor Review    Row Name 01/11/19 1514 02/05/19 1110 04/09/19 1357 06/11/19 1319       Core Components/Risk Factors/Patient Goals Review   Personal Goals Review  Weight Management/Obesity;Develop more efficient breathing techniques such as purse lipped breathing and diaphragmatic breathing and practicing self-pacing with activity.;Stress;Increase knowledge of respiratory medications and ability to use respiratory devices properly.;Improve shortness of breath with ADL's  Weight Management/Obesity;Increase knowledge of respiratory medications and ability to use respiratory devices properly.;Improve shortness of breath with ADL's;Stress  Weight Management/Obesity;Develop more efficient breathing techniques such as purse lipped breathing and diaphragmatic breathing and practicing self-pacing with activity.;Increase knowledge of respiratory medications and ability to use respiratory devices properly.;Improve shortness of breath with ADL's  Develop more efficient breathing techniques such as purse lipped breathing and diaphragmatic breathing and practicing self-pacing with activity.;Increase knowledge of respiratory medications and ability to use respiratory devices properly.;Improve shortness of breath with ADL's    Review  --  She just started the program and it is too early to have met any program goals.  She has been slow to progress due to poor attendance, department has been closed x 7 weeks and will resume in person exercise classes on 04/17/2019.  We will decide what levels she should be exercising at upon return.  Is taking Ofev and has added Esbriet and she has not tolerated it well, she was absent last week and will hopefully return this week, she has been slow to progress due to the side effects of medications she takes for  her interstitial lung disease.    Expected Outcomes  --  See admission goals.  See admission goals.  See admission goals.       Exercise Goals and Review: Exercise Goals    Row Name 01/11/19 [phone] 02/05/19 1121 04/10/19 [id number]  05/15/19 0715 06/12/19 0750     Exercise Goals   Increase Physical Activity  Yes  Yes  Yes  Yes  Yes   Intervention  Provide advice, education, support and counseling about physical activity/exercise needs.;Develop an individualized exercise prescription for aerobic and resistive training based on initial evaluation findings, risk stratification, comorbidities and participant's personal goals.  Provide advice, education, support and counseling about physical activity/exercise needs.;Develop an individualized exercise prescription for aerobic and resistive training based on initial evaluation findings, risk stratification, comorbidities and participant's personal goals.  Provide advice, education, support and counseling about physical activity/exercise needs.;Develop an individualized exercise prescription for aerobic and resistive training based on initial evaluation findings, risk stratification, comorbidities and participant's personal goals.  Provide advice, education, support and counseling about physical activity/exercise needs.;Develop an individualized exercise prescription for aerobic and resistive training based on initial evaluation findings, risk stratification, comorbidities and participant's personal goals.  Provide advice, education, support and counseling about physical activity/exercise needs.;Develop an individualized exercise prescription for aerobic and resistive training based on initial evaluation findings, risk stratification, comorbidities and participant's personal goals.   Expected Outcomes  Short Term: Attend rehab on a regular basis to increase amount of physical activity.;Long Term: Add in home exercise to make exercise part of routine and to increase  amount of physical activity.;Long Term: Exercising regularly at least 3-5 days a week.  Short Term: Attend rehab on a regular basis to increase amount of physical activity.;Long Term: Add in home exercise to make exercise part of routine and to increase amount of physical activity.;Long Term: Exercising regularly at least 3-5 days a week.  Short Term: Attend rehab on a regular basis to increase amount of physical activity.;Long Term: Add in home exercise to make exercise part of routine and to increase amount of physical activity.;Long Term: Exercising regularly at least 3-5 days a week.  Short Term: Attend rehab on a regular basis to increase amount of physical activity.;Long Term: Add in home exercise to make exercise part of routine and to increase amount of physical activity.;Long Term: Exercising regularly at least 3-5 days a week.  Short Term: Attend rehab on a regular basis to increase amount of physical activity.;Long Term: Add in home exercise to make exercise part of routine and to increase amount of physical activity.;Long Term: Exercising regularly at least 3-5 days a week.   Increase Strength and Stamina  Yes  Yes  Yes  Yes  Yes   Intervention  Provide advice, education, support and counseling about physical activity/exercise needs.;Develop an individualized exercise prescription for aerobic and resistive training based on initial evaluation findings, risk stratification, comorbidities and participant's personal goals.  Provide advice, education, support and counseling about physical activity/exercise needs.;Develop an individualized exercise prescription for aerobic and resistive training based on initial evaluation findings, risk stratification, comorbidities and participant's personal goals.  Provide advice, education, support and counseling about physical activity/exercise needs.;Develop an individualized exercise prescription for aerobic and resistive training based on initial evaluation  findings, risk stratification, comorbidities and participant's personal goals.  Provide advice, education, support and counseling about physical activity/exercise needs.;Develop an individualized exercise prescription for aerobic and resistive training based on initial evaluation findings, risk stratification, comorbidities and participant's personal goals.  Provide advice, education, support and counseling about physical activity/exercise needs.;Develop an individualized exercise prescription for aerobic and resistive training based on initial evaluation findings, risk stratification, comorbidities and participant's personal goals.   Expected Outcomes  Short Term: Increase workloads from initial exercise prescription for resistance, speed, and   METs.;Short Term: Perform resistance training exercises routinely during rehab and add in resistance training at home;Long Term: Improve cardiorespiratory fitness, muscular endurance and strength as measured by increased METs and functional capacity (6MWT)  Short Term: Increase workloads from initial exercise prescription for resistance, speed, and METs.;Short Term: Perform resistance training exercises routinely during rehab and add in resistance training at home;Long Term: Improve cardiorespiratory fitness, muscular endurance and strength as measured by increased METs and functional capacity (6MWT)  Short Term: Increase workloads from initial exercise prescription for resistance, speed, and METs.;Short Term: Perform resistance training exercises routinely during rehab and add in resistance training at home;Long Term: Improve cardiorespiratory fitness, muscular endurance and strength as measured by increased METs and functional capacity (6MWT)  Short Term: Increase workloads from initial exercise prescription for resistance, speed, and METs.;Short Term: Perform resistance training exercises routinely during rehab and add in resistance training at home;Long Term: Improve  cardiorespiratory fitness, muscular endurance and strength as measured by increased METs and functional capacity (6MWT)  Short Term: Increase workloads from initial exercise prescription for resistance, speed, and METs.;Short Term: Perform resistance training exercises routinely during rehab and add in resistance training at home;Long Term: Improve cardiorespiratory fitness, muscular endurance and strength as measured by increased METs and functional capacity (6MWT)   Able to understand and use rate of perceived exertion (RPE) scale  Yes  Yes  Yes  Yes  Yes   Intervention  Provide education and explanation on how to use RPE scale  Provide education and explanation on how to use RPE scale  Provide education and explanation on how to use RPE scale  Provide education and explanation on how to use RPE scale  Provide education and explanation on how to use RPE scale   Expected Outcomes  Short Term: Able to use RPE daily in rehab to express subjective intensity level;Long Term:  Able to use RPE to guide intensity level when exercising independently  Short Term: Able to use RPE daily in rehab to express subjective intensity level;Long Term:  Able to use RPE to guide intensity level when exercising independently  Short Term: Able to use RPE daily in rehab to express subjective intensity level;Long Term:  Able to use RPE to guide intensity level when exercising independently  Short Term: Able to use RPE daily in rehab to express subjective intensity level;Long Term:  Able to use RPE to guide intensity level when exercising independently  Short Term: Able to use RPE daily in rehab to express subjective intensity level;Long Term:  Able to use RPE to guide intensity level when exercising independently   Able to understand and use Dyspnea scale  Yes  Yes  Yes  Yes  Yes   Intervention  Provide education and explanation on how to use Dyspnea scale  Provide education and explanation on how to use Dyspnea scale  Provide  education and explanation on how to use Dyspnea scale  Provide education and explanation on how to use Dyspnea scale  Provide education and explanation on how to use Dyspnea scale   Expected Outcomes  Short Term: Able to use Dyspnea scale daily in rehab to express subjective sense of shortness of breath during exertion;Long Term: Able to use Dyspnea scale to guide intensity level when exercising independently  Short Term: Able to use Dyspnea scale daily in rehab to express subjective sense of shortness of breath during exertion;Long Term: Able to use Dyspnea scale to guide intensity level when exercising independently  Short Term: Able to use Dyspnea   scale daily in rehab to express subjective sense of shortness of breath during exertion;Long Term: Able to use Dyspnea scale to guide intensity level when exercising independently  Short Term: Able to use Dyspnea scale daily in rehab to express subjective sense of shortness of breath during exertion;Long Term: Able to use Dyspnea scale to guide intensity level when exercising independently  Short Term: Able to use Dyspnea scale daily in rehab to express subjective sense of shortness of breath during exertion;Long Term: Able to use Dyspnea scale to guide intensity level when exercising independently   Knowledge and understanding of Target Heart Rate Range (THRR)  Yes  Yes  Yes  Yes  Yes   Intervention  Provide education and explanation of THRR including how the numbers were predicted and where they are located for reference  Provide education and explanation of THRR including how the numbers were predicted and where they are located for reference  Provide education and explanation of THRR including how the numbers were predicted and where they are located for reference  Provide education and explanation of THRR including how the numbers were predicted and where they are located for reference  Provide education and explanation of THRR including how the numbers were  predicted and where they are located for reference   Expected Outcomes  Short Term: Able to state/look up THRR;Long Term: Able to use THRR to govern intensity when exercising independently;Short Term: Able to use daily as guideline for intensity in rehab  Short Term: Able to state/look up THRR;Long Term: Able to use THRR to govern intensity when exercising independently;Short Term: Able to use daily as guideline for intensity in rehab  Short Term: Able to state/look up THRR;Long Term: Able to use THRR to govern intensity when exercising independently;Short Term: Able to use daily as guideline for intensity in rehab  Short Term: Able to state/look up THRR;Long Term: Able to use THRR to govern intensity when exercising independently;Short Term: Able to use daily as guideline for intensity in rehab  Short Term: Able to state/look up THRR;Long Term: Able to use THRR to govern intensity when exercising independently;Short Term: Able to use daily as guideline for intensity in rehab   Understanding of Exercise Prescription  Yes  Yes  Yes  Yes  Yes   Intervention  Provide education, explanation, and written materials on patient's individual exercise prescription  Provide education, explanation, and written materials on patient's individual exercise prescription  Provide education, explanation, and written materials on patient's individual exercise prescription  Provide education, explanation, and written materials on patient's individual exercise prescription  Provide education, explanation, and written materials on patient's individual exercise prescription   Expected Outcomes  Short Term: Able to explain program exercise prescription;Long Term: Able to explain home exercise prescription to exercise independently  Short Term: Able to explain program exercise prescription;Long Term: Able to explain home exercise prescription to exercise independently  Short Term: Able to explain program exercise prescription;Long Term:  Able to explain home exercise prescription to exercise independently  Short Term: Able to explain program exercise prescription;Long Term: Able to explain home exercise prescription to exercise independently  Short Term: Able to explain program exercise prescription;Long Term: Able to explain home exercise prescription to exercise independently      Exercise Goals Re-Evaluation: Exercise Goals Re-Evaluation    Row Name 02/05/19 1122 04/10/19 0905 05/15/19 0715 05/15/19 0805 06/12/19 0751     Exercise Goal Re-Evaluation   Exercise Goals Review  Increase Physical Activity;Increase Strength and Stamina;Able to understand   and use rate of perceived exertion (RPE) scale;Able to understand and use Dyspnea scale;Knowledge and understanding of Target Heart Rate Range (THRR);Understanding of Exercise Prescription  Increase Physical Activity;Increase Strength and Stamina;Able to understand and use rate of perceived exertion (RPE) scale;Able to understand and use Dyspnea scale;Knowledge and understanding of Target Heart Rate Range (THRR);Understanding of Exercise Prescription  Increase Physical Activity;Increase Strength and Stamina;Able to understand and use rate of perceived exertion (RPE) scale;Able to understand and use Dyspnea scale;Knowledge and understanding of Target Heart Rate Range (THRR);Understanding of Exercise Prescription  --  Increase Physical Activity;Increase Strength and Stamina;Able to understand and use rate of perceived exertion (RPE) scale;Able to understand and use Dyspnea scale;Knowledge and understanding of Target Heart Rate Range (THRR);Understanding of Exercise Prescription   Comments  Pt has completed 3 exercise sessions. Pt missed a couple of visits following her last chemo session. Pt returned and experienced weakness. Pt currently exercises at 1.7 METs on the stepper. Will continue to monitor and progress as able.  Pt has not exercised since we ceased in person exercise. Pt will return  2/23. Will monitor and progress as able.  Pt has attended 13 exercise sessions. She is progressing slowly and remains lethargic and SOB. Pt currently exercises at 1.8 METs on the stepper. Will continue to monitor and progress as able.  --  Pt has attended 18 exercise sessions. Pt has been much more motivated, but adverse side effects to her ILD medications have limited her. She will graduate this week and I have been encouraging her to join the maintenance program. She currently exercises at 1.8 METs on the stepper. Will continue to monitor and progress as able.   Expected Outcomes  Through exercise at rehab and at home, the patient will decrease shortness of breath with daily activities and feel confident in carrying out an exercise regime at home.   Through exercise at rehab and at home, the patient will decrease shortness of breath with daily activities and feel confident in carrying out an exercise regime at home.   Through exercise at rehab and at home, the patient will decrease shortness of breath with daily activities and feel confident in carrying out an exercise regime at home.   Through exercise at rehab and at home, the patient will decrease shortness of breath with daily activities and feel confident in carrying out an exercise regime at home.   Through exercise at rehab and at home, the patient will decrease shortness of breath with daily activities and feel confident in carrying out an exercise regime at home.       Nutrition & Weight - Outcomes: Pre Biometrics - 01/11/19 1502      Pre Biometrics   Grip Strength  18 kg        Nutrition: Nutrition Therapy & Goals - 02/01/19 1408      Nutrition Therapy   Diet  Mediterranean      Personal Nutrition Goals   Nutrition Goal  Pt to identify food quantities necessary to achieve weight loss of 6-24 lb at graduation from cardiac rehab.    Personal Goal #2  Pt to build a healthy plate including vegetables, fruits, whole grains, and low-fat  dairy products in a heart healthy meal plan.      Intervention Plan   Intervention  Prescribe, educate and counsel regarding individualized specific dietary modifications aiming towards targeted core components such as weight, hypertension, lipid management, diabetes, heart failure and other comorbidities.;Nutrition handout(s) given to patient.  Expected Outcomes  Short Term Goal: Understand basic principles of dietary content, such as calories, fat, sodium, cholesterol and nutrients.;Short Term Goal: A plan has been developed with personal nutrition goals set during dietitian appointment.;Long Term Goal: Adherence to prescribed nutrition plan.       Nutrition Discharge: Nutrition Assessments - 02/01/19 1436      Rate Your Plate Scores   Pre Score  46       Education Questionnaire Score: Knowledge Questionnaire Score - 01/11/19 1556      Knowledge Questionnaire Score   Pre Score  17/18       Goals reviewed with patient; copy given to patient. 

## 2019-06-26 ENCOUNTER — Encounter (HOSPITAL_COMMUNITY)
Admission: RE | Admit: 2019-06-26 | Discharge: 2019-06-26 | Disposition: A | Discharge status: Home / Self Care | Payer: Self-pay | Source: Ambulatory Visit | Attending: Internal Medicine | Admitting: Internal Medicine

## 2019-06-26 ENCOUNTER — Other Ambulatory Visit: Payer: Self-pay

## 2019-06-26 DIAGNOSIS — J84112 Idiopathic pulmonary fibrosis: Secondary | ICD-10-CM | POA: Insufficient documentation

## 2019-06-27 ENCOUNTER — Telehealth: Payer: Self-pay | Admitting: Adult Health

## 2019-06-27 NOTE — Telephone Encounter (Signed)
Pt returning call.  [phone]

## 2019-06-27 NOTE — Telephone Encounter (Signed)
Returned patient call but no answer.  Left voicemail requesting a return call.  She is to take Ofev 150 mg twice daily as well as Esbriet 801 mg 1 tablet three times daily with meals.  She may take loperamide 2 capsules at first loose stool then 1 capsule after each subsequent loose stool with a maximum of 8 capsules daily.  She can also take 1 capsule daily for maintenance dose.  She is also on magnesium oxide which can contribute to loose stools which she can stop taking if there is no specific indication for use.   Mariella Saa, PharmD, [place], [place] Clinical Specialty Pharmacist [phone]  06/27/2019 3:11 PM

## 2019-06-28 ENCOUNTER — Ambulatory Visit (HOSPITAL_COMMUNITY)
Admission: RE | Admit: 2019-06-28 | Discharge: 2019-06-28 | Disposition: A | Discharge status: Home / Self Care | Payer: BC Managed Care – PPO | Source: Ambulatory Visit | Attending: Internal Medicine | Admitting: Internal Medicine

## 2019-06-28 ENCOUNTER — Encounter (HOSPITAL_COMMUNITY)
Admission: RE | Admit: 2019-06-28 | Discharge: 2019-06-28 | Disposition: A | Discharge status: Home / Self Care | Payer: Self-pay | Source: Ambulatory Visit | Attending: Internal Medicine | Admitting: Internal Medicine

## 2019-06-28 ENCOUNTER — Other Ambulatory Visit: Payer: Self-pay

## 2019-06-28 DIAGNOSIS — Z452 Encounter for adjustment and management of vascular access device: Secondary | ICD-10-CM | POA: Diagnosis not present

## 2019-06-28 MED ORDER — MONTELUKAST SODIUM 10 MG PO TABS: 10.0000 mg | ORAL_TABLET | Freq: Every day | ORAL | 1 refills | Status: DC

## 2019-06-28 MED ORDER — SODIUM CHLORIDE 0.9% FLUSH
10.0000 mL | INTRAVENOUS | Status: AC | PRN
  Administered 2019-06-28: 13:00:00 10 mL

## 2019-06-28 MED ORDER — ONDANSETRON HCL 8 MG PO TABS: ORAL_TABLET | ORAL | 2 refills | Status: AC

## 2019-06-28 MED ORDER — FAMOTIDINE 40 MG PO TABS: 40.0000 mg | ORAL_TABLET | Freq: Every day | ORAL | 0 refills | Status: DC

## 2019-06-28 MED ORDER — HEPARIN SOD (PORK) LOCK FLUSH 100 UNIT/ML IV SOLN
500.0000 [IU] | INTRAVENOUS | Status: AC | PRN
  Administered 2019-06-28: 500 [IU]

## 2019-06-28 NOTE — Discharge Instructions (Signed)
Port-a-cath flushed with Heparin and 0.9% Sodium Chloride 

## 2019-06-28 NOTE — Telephone Encounter (Signed)
Notified patient of recommendations.  Patient verbalized understanding.  Patient states that she has tolerated the medication better today as she has eaten a slightly bigger meal and added protein.  She is going to continue to take Esbriet 3 times daily to see if she can continue to tolerate.  Patient requested prescription for famotidine and refill for Singulair and Zofran.  Prescription sent to the pharmacy.  All questions encouraged and answered.   Mariella Saa, PharmD, [place], [place] Clinical Specialty Pharmacist [phone]  06/28/2019 4:04 PM

## 2019-06-28 NOTE — Telephone Encounter (Signed)
Thanks and yes she should take a drug holiday from pirfenidone/Esbriet for 1 week or even 2 weeks.  But if she stops Esbriet for 2 weeks then she has to start upward titration from 1 pill 3 times daily onwards  Glad she is trying and I am appreciate all the support you giving her and helping her with the medication compliance

## 2019-06-28 NOTE — Progress Notes (Signed)
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 PAC flushed with Heparin and 0.9% Sodium Chloride and de-accessed. Patient alert, oriented and ambulatory at discharge.             

## 2019-06-28 NOTE — Telephone Encounter (Signed)
Returned patient call.  Patient is having difficulty tolerating combination of Ofev and Esbriet.  She states that she is experiencing nausea that is unbearable especially with her evening dose. She has been eating soup for dinner lately and advised that might not be substatinal enough to minimize GI upset.  Patient verbalized understanding.  She states her morning routine is taking Zofran and thyroid on empty stomach when she eats breakfast.  Shortly after taking back that she takes, Esbriet, carvedilol, Metformin, potassium, and prednisone.  She is interested if she can alter her medication schedule to help reduce nausea.  Recommended patient try ginger tablets and her flat ginger ale to help with nausea.  Patient verbalized understanding.  In addition to nausea patient states she is experiencing a burning sensation in her chest which she believes is acid reflux.  Patient started taking Tagamet over-the-counter.  Advised patient that Tagamet interacts with several medications and encourage patient to switch to Pepcid.  Patient verbalized understanding.  After reviewing her current medication list cimetidine does interact with Metformin.  Patient is interested in starting a prescription medication such as Prilosec.  She also is experiencing diarrhea and wanted to know if there was a prescription medication she could take.  Recommended over-the-counter Imodium.  Patient states that it causes constipation.  Advised patient to try taking 1 capsule every other day to help alleviate diarrhea but possibly prevent constipation.  Patient verbalized understanding.   Patient is concerned about current side effects as she is celebrating her son's graduation and will be traveling in 1 week.  Would want to give Esbriet a fair chance but wanted to know if she could take a drug holiday during her travel.  Advised I will reach out to Dr. [name] about her concerns and how to proceed with Esbriet dosing.  Patient  verbalized understanding.  Mariella Saa, PharmD, [place], [facility] Clinical Specialty Pharmacist [phone]  06/28/2019 1:49 PM

## 2019-07-03 ENCOUNTER — Encounter (HOSPITAL_COMMUNITY)
Admission: RE | Admit: 2019-07-03 | Discharge: 2019-07-03 | Disposition: A | Discharge status: Home / Self Care | Payer: Self-pay | Source: Ambulatory Visit | Attending: Internal Medicine | Admitting: Internal Medicine

## 2019-07-03 ENCOUNTER — Encounter: Payer: Self-pay | Admitting: Adult Health

## 2019-07-03 ENCOUNTER — Ambulatory Visit (INDEPENDENT_AMBULATORY_CARE_PROVIDER_SITE_OTHER): Payer: BC Managed Care – PPO | Admitting: Adult Health

## 2019-07-03 ENCOUNTER — Other Ambulatory Visit: Payer: Self-pay

## 2019-07-03 ENCOUNTER — Other Ambulatory Visit (INDEPENDENT_AMBULATORY_CARE_PROVIDER_SITE_OTHER): Payer: BC Managed Care – PPO

## 2019-07-03 DIAGNOSIS — Z79899 Other long term (current) drug therapy: Secondary | ICD-10-CM

## 2019-07-03 DIAGNOSIS — J8489 Other specified interstitial pulmonary diseases: Secondary | ICD-10-CM

## 2019-07-03 DIAGNOSIS — R309 Painful micturition, unspecified: Secondary | ICD-10-CM | POA: Diagnosis not present

## 2019-07-03 LAB — HEPATIC FUNCTION PANEL
ALT: 18 U/L (ref 0–35)
AST: 14 U/L (ref 0–37)
Albumin: 3.8 g/dL (ref 3.5–5.2)
Alkaline Phosphatase: 52 U/L (ref 39–117)
Bilirubin, Direct: 0 mg/dL (ref 0.0–0.3)
Total Bilirubin: 0.3 mg/dL (ref 0.2–1.2)
Total Protein: 6.3 g/dL (ref 6.0–8.3)

## 2019-07-03 MED ORDER — CEPHALEXIN 500 MG PO CAPS: 500.0000 mg | ORAL_CAPSULE | Freq: Two times a day (BID) | ORAL | 0 refills | Status: DC

## 2019-07-03 NOTE — Progress Notes (Signed)
Virtual Visit via Telephone Note  I connected with Tasha Ballard on 07/03/19 at  4:30 PM EDT by telephone and verified that I am speaking with the correct person using two identifiers.  Location: Patient: Home  Provider: O   I discussed the limitations, risks, security and privacy concerns of performing an evaluation and management service by telephone and the availability of in person appointments. I also discussed with the patient that there may be a patient responsible charge related to this service. The patient expressed understanding and agreed to proceed.   History of Present Illness: 49 year old female never smoker seen for pulmonary consult October 2019 to establish for evaluation of interstitial lung disease.  She has been followed at [facility].  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  She was initially placed on CellCept.  In 2016 had a significant clinical decline and was started on oxygen.  She has been seen at [facility] with Dr. 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  Since that time she has had progressive respiratory decline and increased symptom burden She was treated with 1 full year of oral and IV Cytoxan from December 2019 to November 2020.  Also in December 2019 she was started on Ofev twice daily.  She was started on Imuran January 2021.  And she remains on prophylactic Bactrim 3 days a week. Started on Esbriet March 2021  Today's televisit is an acute office visit for urinary urgency and dysuria.  Patient is maintained on Ofev twice daily and aspirin 3 days a week.  For her interstitial lung disease.  She intermittently gets episodes of diarrhea depending on the food intake she has she has recently eliminated dairy which has helped this but 1 week ago she had significant  diarrhea for a few days in a row.  This is causes perineal irritation and she has had this a couple times shortly after having that she experiences urinary urgency and dysuria.  Feels that she is getting a urinary tract infection.  She is leaving to go on vacation and requested I call in her an antibiotic.  She is unable to come in to leave a urinary sample today.  But is adamant that it feels just like her last urinary tract infection.  She has no hematuria back pain fever nausea vomiting diarrhea.  She says overall her breathing has been doing stable she actually has increased her activity tolerance and has not been able to walk on the treadmill and do her stationary bike.  Today she was able to do 18 minutes on 8 L of oxygen which is very good for her. Her baseline oxygen requirements are 6 L of oxygen at rest and 8-10 with ambulation. She had liver function testing today that was normal.   Observations/Objective: Patient speaks in full sentences with no audible distress.  Assessment and Plan: Dysuria and urinary urgency possible underlying urinary tract infection.  We went over preventative regimens with using cotton underwear, urinating frequently to prevent urinary retention, water intake and perineal care.  Have advised her that she needs to follow-up with a GYN for her routine yearly exams as she has missed this for the last 2 years when her primary care provider moved.  She is unable to leave a urinary specimen today.  We will send in 3 days of  Keflex if symptoms do not improve she will need to be seen at an urgent care while on vacation.  I have advised her of red flag symptoms such as back pain fever hematuria or inability to take in food or liquids.  Patient is following back up in the office later this month we will recheck her urine on return to make sure she is clear. The symptoms also seem to occur after having sex as well.  Discussed urination after sex and showering to see if this may  help.  Interstitial lung disease/progressive NSIP.  Patient is currently stable on her very aggressive maintenance regimen.  Hepatic function testing today was normal.  She can continue on her current regimen.  We will discuss on more detail on her follow-up visit later this month  Plan  Patient Instructions  Keflex 500 mg twice daily for 3 days, If urinary symptoms do not resolve you need to contact our office immediately or go to the emergency room or urgent care for evaluation We will discuss on your return visit a referral to GYN to establish. We will check a urine specimen on your follow-up visit later this month. Please drink plenty of water.  Urinate frequently and empty your bladder often. Saline nasal spray and gel As needed   Continue on Imuran 137m daily  Continue on Ofev 150 mg twice daily Continue on prednisone 10 mg daily Esbriet as directed.  Continue on Bactrim M/W/F .  Continue on oxygen-goal is to keep oxygen levels greater than 88 to 90% Pulmonary rehab  Work on healthy weight loss.  Follow-up in 4  weeks with Dr. RChase Calleror Desaray Marschner NP and As needed   Please contact office for sooner follow up if symptoms do not improve or worsen or seek emergency care       Follow Up Instructions: Follow up in 4 weeks and As needed     I discussed the assessment and treatment plan with the patient. The patient was provided an opportunity to ask questions and all were answered. The patient agreed with the plan and demonstrated an understanding of the instructions.   The patient was advised to call back or seek an in-person evaluation if the symptoms worsen or if the condition fails to improve as anticipated.  I provided 22  minutes of non-face-to-face time during this encounter.   TRexene Edison NP

## 2019-07-03 NOTE — Patient Instructions (Addendum)
Keflex 500 mg twice daily for 3 days, If urinary symptoms do not resolve you need to contact our office immediately or go to the emergency room or urgent care for evaluation We will discuss on your return visit a referral to GYN to establish. We will check a urine specimen on your follow-up visit later this month. Please drink plenty of water.  Urinate frequently and empty your bladder often. Saline nasal spray and gel As needed   Continue on Imuran 144m daily  Continue on Ofev 150 mg twice daily Continue on prednisone 10 mg daily Esbriet as directed.  Continue on Bactrim M/W/F .  Continue on oxygen-goal is to keep oxygen levels greater than 88 to 90% Pulmonary rehab  Work on healthy weight loss.  Follow-up in 4  weeks with Dr. RChase Calleror Desmon Hitchner NP and As needed   Please contact office for sooner follow up if symptoms do not improve or worsen or seek emergency care

## 2019-07-05 ENCOUNTER — Telehealth: Payer: Self-pay | Admitting: Internal Medicine

## 2019-07-05 ENCOUNTER — Other Ambulatory Visit: Payer: Self-pay

## 2019-07-05 ENCOUNTER — Other Ambulatory Visit: Payer: Self-pay | Admitting: Internal Medicine

## 2019-07-05 ENCOUNTER — Encounter (HOSPITAL_COMMUNITY)
Admission: RE | Admit: 2019-07-05 | Discharge: 2019-07-05 | Disposition: A | Discharge status: Home / Self Care | Payer: Self-pay | Source: Ambulatory Visit | Attending: Internal Medicine | Admitting: Internal Medicine

## 2019-07-05 DIAGNOSIS — J849 Interstitial pulmonary disease, unspecified: Secondary | ICD-10-CM

## 2019-07-05 MED ORDER — AZATHIOPRINE 100 MG PO TABS: 100.0000 mg | ORAL_TABLET | Freq: Every day | ORAL | 3 refills | Status: DC

## 2019-07-05 NOTE — Telephone Encounter (Signed)
Spoke with pt, states that she is traveling out of town and [employer] is requesting a new prescription for her O2.  [employer] states they will not provide O2 for pt during her travels unless she gets an order from our office. Pt currently wears 4-6lpm at rest, 8-12lpm with exertion.   [employer] faxed forms to our office to be filled out.  I checked MR and TP's boxes but could not find forms. Rodena Piety please advise if you have received forms on this pt, or if we need to re-request them.  Thanks!

## 2019-07-05 NOTE — Telephone Encounter (Signed)
Called and spoke with pt to verify if she wanted a 30-day supply or 90-day supply of her imuran and also to verify if she was doing 69m or 1030mdaily and she stated she was taking 10046maily and requested 90-day supply. rx sent to pharmacy for pt. Nothing further needed.

## 2019-07-05 NOTE — Telephone Encounter (Signed)
I have the CMNs for TP and MR. Estill Bamberg from [place] has confirmed that I do have the forms. TP will be in the office in the morning and I will ask her to sign the form since she has seen the patient the last 2 times

## 2019-07-06 NOTE — Telephone Encounter (Signed)
The CMN has been given to [facility] for [name] Parrett to hopefully sign since she is available and MR is not. The Patient needs the forms signed and faxed back to [facility] today

## 2019-07-09 NOTE — Telephone Encounter (Signed)
The CMN was signed by Rexene Edison on 05/14 and it was faxed to [place] on 07/06/2019. I received confirmation that the fax was received

## 2019-07-10 ENCOUNTER — Encounter (HOSPITAL_COMMUNITY): Payer: Self-pay

## 2019-07-10 ENCOUNTER — Ambulatory Visit: Payer: BC Managed Care – PPO | Admitting: Adult Health

## 2019-07-12 ENCOUNTER — Encounter (HOSPITAL_COMMUNITY): Payer: Self-pay

## 2019-07-17 ENCOUNTER — Other Ambulatory Visit: Payer: Self-pay

## 2019-07-17 ENCOUNTER — Encounter (HOSPITAL_COMMUNITY)
Admission: RE | Admit: 2019-07-17 | Discharge: 2019-07-17 | Disposition: A | Discharge status: Home / Self Care | Payer: Self-pay | Source: Ambulatory Visit | Attending: Internal Medicine | Admitting: Internal Medicine

## 2019-07-19 ENCOUNTER — Other Ambulatory Visit: Payer: Self-pay

## 2019-07-19 ENCOUNTER — Telehealth: Payer: Self-pay | Admitting: Internal Medicine

## 2019-07-19 ENCOUNTER — Ambulatory Visit (INDEPENDENT_AMBULATORY_CARE_PROVIDER_SITE_OTHER)
Admission: RE | Admit: 2019-07-19 | Discharge: 2019-07-19 | Disposition: A | Discharge status: Home / Self Care | Payer: BC Managed Care – PPO | Source: Ambulatory Visit | Attending: Internal Medicine | Admitting: Internal Medicine

## 2019-07-19 ENCOUNTER — Encounter (HOSPITAL_COMMUNITY)
Admission: RE | Admit: 2019-07-19 | Discharge: 2019-07-19 | Disposition: A | Discharge status: Home / Self Care | Payer: Self-pay | Source: Ambulatory Visit | Attending: Internal Medicine | Admitting: Internal Medicine

## 2019-07-19 DIAGNOSIS — R911 Solitary pulmonary nodule: Secondary | ICD-10-CM

## 2019-07-19 NOTE — Telephone Encounter (Signed)
Opal Sidles from [facility] calling with CT scan results. Opal Sidles phone number is [phone].

## 2019-07-19 NOTE — Telephone Encounter (Signed)
Received call report from [place] with [facility] on patient's HRCT done on today, 5/27. MR, please review the result/impression copied below:  IMPRESSION: 1. Interval growth of left lower lobe nodular density which currently measures 1.5 x 1.3 cm (previously 1.0 cm). Further evaluation with PET-CT should be considered. 2. The appearance of the lungs remains compatible with usual interstitial pneumonia (UIP) per current ATS guidelines. No progression of disease compared to the prior study. 3. Aortic atherosclerosis.  Please advise, thank you.

## 2019-07-20 ENCOUNTER — Telehealth: Payer: Self-pay | Admitting: Internal Medicine

## 2019-07-20 ENCOUNTER — Ambulatory Visit (INDEPENDENT_AMBULATORY_CARE_PROVIDER_SITE_OTHER): Payer: BC Managed Care – PPO | Admitting: Internal Medicine

## 2019-07-20 ENCOUNTER — Other Ambulatory Visit: Payer: Self-pay | Admitting: Internal Medicine

## 2019-07-20 DIAGNOSIS — R911 Solitary pulmonary nodule: Secondary | ICD-10-CM | POA: Diagnosis not present

## 2019-07-20 DIAGNOSIS — N390 Urinary tract infection, site not specified: Secondary | ICD-10-CM

## 2019-07-20 NOTE — Telephone Encounter (Signed)
Schedule a telephone visit with me personally today 07/20/2019 - can be for 12.15 or 13.15/13.30 . I am in [facility] clinic. This needs to be done and I need to personally talk to patient. Please do not do it befoe 9am because I have a meeting  If any issues please let me know directly   Thanks  MR

## 2019-07-20 NOTE — Progress Notes (Signed)
-Lung function November 08, 2017: FVC 0.9 L / 27%, DLCO 5.86/29%  - HRCT 11/08/17 Rt diaphragm elevation ? Eventration v paresis.  Esophagogram November 11, 2017 - > 11/11/17 [name] - Normal  Dr. Threasa Alpha at [facility].She was started on  IOV 11/22/2017  Chief Complaint  Patient presents with  . Consult    consut due to pulmonary fibrosis. Pt has been followed by [facility] and last PFT was peformed 8/14 at [facility]. Pt does have complaints of SOB with exertion which she states has become better due to rehab. Pt also has a dry hacking cough with mild production of mucus. Denies any CP.    Is seen by presents to the ILD clinic.  This is her first visit with [name].  History is obtained from her and review of the [place] where she is to follow-up with Dr. Hortencia Pilar.  Interstitial lung disease clinic integrated interstitial lung disease questionnaire: Symptoms: Shortness of breath for years.  Gradually worse.  Currently level 5 shortness of breath walking up stairs or walking up a hill with oxygen.  Level for shortness of breath for sweeping or vacuuming and level 3 shortness of breath for shopping or picking up things and doing laundry.  Level 3 shortness of breath or standing up from a chair or taking a shower making the bed and level 1 dyspnea for brushing teeth.  She also has a cough for 6 years which is getting worse.  On and off that is phlegm.  Occasional wheezing present there is associated fatigue.  She tells me that she had undiagnosed and unrecognized ILD for years.  This was then picked up at [facility] and then she got referred to [facility].  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  At this time she was placed on CellCept.  Then in 2016 she got somewhat worse and was placed on 2 L nasal cannula continuously.  In 2017 she tells me that she had a aortic aneurysm and this was  repaired at [facility].  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  And then for the last year and a half or so she has been on significantly worsening hypoxemia.  In the spring 2019 she is requiring 8 L at rest and 15 L at exertion.  Apparently Dr. Lauris Chroman then recommended Rituxan infusions which she got to 60 days apart with the last one being approximately in April of May 2019.  After that she is somewhat improved requiring only 9-10 L with exertion.  She has been attending pulmonary rehabilitation which has been helping.  She is obese and unable to lose weight although she is lost some.  She believes a lot of her oxygen needs might be related to deviated nasal septum/septal perforation.  She subsequently followed up with Dr. Ruthann Cancer at [facility] in the summer 2019.  I reviewed the notes.  Palliative care was recommended because of progressive respiratory failure.  She has never seen the transplant clinic at [facility] but she recommends that because of obesity and diaphragmatic dysfunction that she would not be a good candidate for transplant .  She was then frustrated with experience because she wants to fight the disease.  She then drove to [facility] and saw Dr. Elisabeth Cara at [facility] 1 week ago.  She spent substantial number  of days there.  At this point in time she showed me an email from him that suggest he might have myositis-ILD but they would like to review her CT scans and pathology from [facility].  Apparently she had significant amount of blood work and other tests at [facility]  I do not have access to these records at this point.  She believes that she wants to undergo transplant.  She thinks that she would be able to have transplant in [place] despite a high BMI.  Currently in [place] she is somewhat stable attending pulmonary rehabilitation.  She is interested in pulmonary trials  Past  medical history  -Tested for sleep apnea at [facility] and was told to be negative.  But at [facility] in September 2019 they recommended a repeat sleep study -Denied all forms of connective tissue disease diagnosis and vasculitis.  Although at [facility] September 2019 when she was seen last week she was told she might have myositis-ILD but this is pending confirmation in the multidisciplinary case conference -She does report positive for diabetes and thyroid disease and mononucleosis in the past but denies any tuberculosis or kidney disease   Review of systems -Positive for fatigue for a few years, arthralgia for a few years but denies dysphagia.  Positive for dry eyes and mouth for few years.  No Raynaud.  No recurrent fever.  No weight loss.  No acid reflux but does admit to snoring for a few years.  No rash no ulcers.  Family history of lung disease COPD and a long but no pulmonary fibrosis.  Dad did have rheumatoid arthritis  Personal exposure history Denies tobacco, vaping, marijuana, cocaine, IV drugs  Home and hobby history  [place] home which is [place] years old  Occupational history -She does stay in a condition spaces.  122 point occupational questionnaire completely negative  Medication history -She has a history of allergy to Macrobid/nitrofurantoin.  Do not know when she took it.  She is currently on immunosuppressive regimen of CellCept, Bactrim and prednisone.  CellCept since 2013 and prednisone since 2012.  She did get Rituxan x2 doses with the last one being in spring 2019.  She feels her hypoxemia improved somewhat after that.     has a past medical history of Aneurysm ([place]), Diabetes mellitus without complication ([place]), and Thyroid disease.   reports that she has never smoked. She has never used smokeless tobacco.   OV 12/21/2017  Subjective:  Patient ID: Tasha Ballard, female , DOB: January 26, 1971 , age 17 y.o. , MRN: [id number] ,  ADDRESS: [address] [place] [address]   12/21/2017 -   Chief Complaint  Patient presents with  . Follow-up    ILD     HPI ARYSSA ROSAMOND 49 y.o. -returns for follow-up.  She brings her mother along who I am meeting for the first time.  This visit is specifically focused on evaluation from [place].  Yesterday I did speak to the interstitial lung disease specialist at [facility] Dr. Threasa Alpha.  He told me he was finally able to receive and review the surgical lung biopsy from [facility].  He and his pathologist agreed with the diagnosis of progressive NSIP.  His main recommendation was to switch patient to Cytoxan.  In addition also aggressively pursue weight loss even if it means for bariatric surgery approach so we can get ready for transplant.  He agreed the prognosis is very poor.  At this point in time patient compared to the last visit is stable.  She is using more than 10 L of oxygen up to 15 L of oxygen.  She is interested in Cytoxan.  Dr. Elisabeth Cara indicated to me that efficacy wise oral and IV are similar but he felt IV would be a little bit safer from a urologic standpoint.  Patient is interested in weight loss.  She says she has had difficulty losing weight despite being hypocaloric.  She is interested in nutritional referral.  In addition she wants a sleep specialist referral.  She says [place] [facility] was ruled out.  She has significant insomnia and daytime tiredness.  She said that [place] recommended sleep referral ASAP.  She is registered to participate in the ILD-pro registry study for progressive non-IPF disease.  This was done today.   Investigations at [facility] -November 09, 2017: Echocardiogram ejection fraction 70-75% with grade 2 diastolic dysfunction.  Right ventricle was mildly enlarged but global RV function was felt to be normal.  No pericardial effusion no obvious right-to-left shunt at rest or cough or Valsalva on agitated  saline contrast exam and stage I right ventricular diastolic dysfunction.  Pulmonary artery systolic pressure at 45 mmHg  -Lung function November 08, 2017: FVC 0.9 L / 27%, DLCO 5.86/29%  - HRCT 11/08/17 Rt diaphragm elevation ? Eventration v paresis. Sniff test recommendd  Esophagogram November 11, 2017 - > 11/11/17 Natil Jewsh - Normal    12/22/2017-telephone encounter - Dr. [name] >>>starting Cytoxan 50 mg/day >>>Avoid lots of fluids to avoid cystitis, avoid intercourse without contraception, return in 7 to 10 days to recheck CBC, Bmet, LFTs, UA  12/22/17-office visit-[place] >>>Plan in lab sleep study be ordered, patient will likely need BiPAP with oxygen  12/30/2017  - Visit   49 year old female patient presenting today for one-week follow-up visit after starting Cytoxan.  Patient also get lab work completed today.  Unfortunately patient did not get this completed prior to her office visit she will get afterwards.  Patient reports baseline symptoms of nonproductive cough.  Requiring 4 to 6 L of oxygen at rest as well as 10 to 15 L of oxygen with exertion.  Patient does have her baseline shortness of breath with exertion.  Patient reporting no worsening symptoms since starting Cytoxan.   Patient reports she typically is on 9 to 10 L of oxygen at home.   OV 01/17/2018  Subjective:  Patient ID: Tasha Ballard, female , DOB: 11-Mar-1970 , age 58 y.o. , MRN: [id number] , ADDRESS: [address] [place] [address]   01/17/2018 -   Chief Complaint  Patient presents with  . Follow-up    2 week f/u, lung transplant, was contacted and waiting on weight loss to continue   PRogressive NSIP Chronic hypoxemic resp failure Morbid Obesity - needs weight loss for transplant Rx regimen   -  cytoxan 9m per day 12/23/17 and increaed to 1057mon 12/23/17.On11/19/19. increase to 12558mer day  And switched to IV cytoxan Q4 week cycle - 1st cycle 02/03/18.   HPI ChrLEYANNA BITTMAN 59.o. -presents for follow-up with her husband.  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 on cytoxan 64m2mr day 12/23/17 and increaed to 100mg51m11/1/19.  On 01/10/18.  increase to 125mg 9mday .  Plan is to get blood work today.  She wants a Port-A-Cath placed.  She prefers IV Cytoxan.  I checked with Dr. Threasa Alpha and he feels either p.o. or IV Cytoxan is fine but in the balance IV Cytoxan has a slightly lower incidence of hemorrhagic cystitis and the patient prefers this.  At this point in time she tells me that the Cytoxan seems to be working well for her.  She is less hypoxemic she is able to get by with 5 L at rest.  On the other hand this could be because of weight loss.  However there is some fatigue.  She will have blood work and urine analysis to look for cytotoxicity from Cytoxan.  Based on the newINBUILD  data we discussed about starting nintedanib.  I checked with Dr. Threasa Alpha at [facility] and he is supportive of the plan.  Patient states that she has done extensive research on nintedanib and she is very excited about the possibility of taking this.   Obesity: Weight 231#  11/22/17  -> 221# 01/17/2018.,  She saw the nutritionist at [facility].  She is using an app and tracking her weight on a regular basis.  She is encouraged by the weight loss.  We gave her a low glycemic diet sheet.  She has not been able to see the bariatric surgeon at [facility] surgery but I did run into Dr. Kaylyn Lim in the hallway and he said he would be happy to see her and counsel her.  Although he did acknowledge she would be high risk for surgery.  He is in favor of low carbohydrate diet.  A few weeks ago I met the transplant physician from [facility] and he brought up the idea about losing weight loss drugs.  His preferred drug is phentermine.  We discussed the  possibility about getting an endocrine consult  Lung transplant: [facility] has called her.  They want to BMI less than 30 or 36 I am not sure.  She is working towards his goal.  She has not heard from [facility].  I emailed them and the day after the visit patient is emailed me saying that she has heard from them.  She is very keen to get on the transplant list.    02/03/2018 OV for Direct Admit for IV Cytoxan  Patient presents to the office today for direct admission to [facility] hospital for IV Cytoxan infusion.  She says her breathing is at baseline.  She remains on oxygen 4 to 6 L at rest and 10 to 15 L with activity.  He says her leg swelling is at baseline.  She remains on Lasix 40 mg daily and Aldactone 100 mg.  She is on chronic steroids with prednisone 10 mg daily. She has been on 05 x1 week.  Says she is tolerating with no nausea vomiting or diarrhea.  Does have some mild constipation.  Patient says she had a Port-A-Cath placed 1 week ago. Patient has chronic shortness of breath gets winded with minimal activity.  Has intermittent dry cough.   02/13/2018 Follow up : Progressive NSIP , O2 RF  Patient returns for a one-week follow-up.  Patient was recently admitted for IV Cytoxan under observation status.. Patient had been on oral Cytoxan.  Continue to have progressive worsening of her ILD.  At baseline she is on 4 to 6 L at rest.  And 10 to 15 L with activity.  She is on Aldactone and Lasix.  She has been started on  OFEV 3 weeks ago.  She is tolerating well with no nausea vomiting or diarrhea.  Does have some mild constipation. Since her Cytoxan infusion.  She says she has done well.  She had minimal nausea.  She does feel that her breathing has slightly improved.  Her oxygen demands are decreased.  She is using mainly only 6 L of oxygen.  And doing well with this with good O2 saturations. Says she was able to actually go out to dinner for the first time in a long  time.  She denies any hematuria, chest pain, orthopnea or increased leg swelling.  She is working on weight loss.  She is going to the weight loss center. Labs were done on 12/20 that showed normal [place] blood cell count, platelets and kidney function. Urinalysis was done today.  And is pending.. Plans are for a Cytoxan infusion in 3- 4 weeks from her last infusion.  OV 03/02/2018  Subjective:  Patient ID: Tasha Ballard, female , DOB: 14-Sep-1970 , age 53 y.o. , MRN: [id number] , ADDRESS: [address] [place] [address]   03/02/2018 -   Chief Complaint  Patient presents with  . Follow-up    Appt prior to pt's next Cytoxan infusion.  Pt states she is doing well since last visit. States she can tell that her breathing has improved. Has had some nausea/vomiting and wonders if it is from the [place].    HPI ILAISAANE MARTS 49 y.o. -presents for the above issues.  I personally saw her before Thanksgiving 2019.  After that she is seen by nurse practitioner under my supervision as documented above for Cytoxan infusion.  She has had post Cytoxan infusion labs which showed her [place] count is decreasing but still adequate.  Most recent labs was February 13, 2018 at the weight loss clinic with a [place] count of 5900.  Rest of the labs reviewed and appear adequate and normal.  Including liver function test.  She is developed new onset of intermittent nausea and vomiting ever since starting nintedanib early December 2019.  This particularly happens with the Christmas meals.  She says this appears at random and is mild and is treated with Zofran.  Given the beneficial effects of nintedanib and progressive non-IPF ILD she is agreed to continue to take this.  She continues on her chronic prednisone and Bactrim prophylaxis.  She is finished 1 cycle of IV Cytoxan February 03, 2018.  She has upcoming second cycle 4 weeks apart on March 06, 2018.  At this point in time she is feeling well.  She feels her ILD has  actually improved.  On 2 L at rest she was saturating at 98%.  This is a huge improvement for her.  However when we walked her she still needed 8 L of oxygen with exertion.  She still says this is an improvement because in the past she was using more than 10 L for exertion.  Today my nurse practitioner will enter the orders for upcoming infusion.  There is no fever.  Morbid obesity: She has seen Dr. Leafy Ro at the weight loss clinic.  I reviewed the notes.  Dietary measures are indicated.  I have written to Dr. Leafy Ro asking about medications to accelerate weight loss given the fact her lung disease is poor prognosis and she is an urgent need for lung transplant and with weight loss being her main barrier.   Obesity: Weight 231#  11/22/17  -> 221# 01/17/2018., -> 222# on  03/02/2018    OV 03/30/2018  Subjective:  Patient ID: Tasha Ballard, female , DOB: 1970-10-06 , age 70 y.o. , MRN: [id number] , ADDRESS: [employer] [place] [address]   03/30/2018 -   Chief Complaint  Patient presents with  . Follow-up    PFT peformed today.  Pt states she has been doing okay since last visit.      HPI SHERINE CORTESE 49 y.o. -presents for follow-up of the above issues.  Her husband is here with her.  In terms of - Obesity: Weight 231#  11/22/17  -> 221# 01/17/2018., -> 222# on 03/02/2018 -> 220# on 03/30/2018  In terms of her progressive NSIP and chronic hypoxemic respiratory failure: She has finished 2 cycles of Cytoxan.  Second cycle was a full dose.  After that she got neutropenic as reviewed on the labs.  During this time she developed a facial rash.  This was on a weekend and over the phone we treated her with doxycycline.  It is largely improved.  Tomorrow she has an appointment with her dermatologist.  I do not know who it is.  Overall she is feeling stable in terms of her IPF.  Her pulmonary function test compared to [facility] shows some improvement.  In fact compared to [place] it is  even better.  She attributes this to both nintedanib and Cytoxan.  She is using 4 L of oxygen at rest and 8 L with exertion.  Her symptom score is documented below with shortness of breath at 21-22.  She always has a lot of fatigue.  In terms of her medicine tolerance particularly with nintedanib she had intermittent nausea and vomiting particularly after eating certain types of foods.  She is managing this with Zofran.  Symptom score is documented below.  Her most recent cycle of Cytoxan was March 06, 2018.  Her next Cytoxan is April 03, 2018.  I discussed with Dr. Julien Nordmann oncologist was a lot of experience with Cytoxan.  He said is okay to go with full dose if I felt so.  Patient herself is very keen on getting a full dose of Cytoxan.  She did not have neutropenic fever although she got neutropenic with the second cycle.     ROS  OV 04/26/2018  Subjective:  Patient ID: Tasha Ballard, female , DOB: 06-29-1970 , age 44 y.o. , MRN: [id number] , ADDRESS: [address] [place] [address]   04/26/2018 -   Chief Complaint  Patient presents with  . Follow-up    Pt states she currently is not feeling too well and believes she might have sinus infection. Pt has had complaints of fatigue, has head congestion, pain in left ear going down to her neck and also has had a sore throat.       HPI RAILEE BONILLAS 49 y.o. - presents for above issues. Here with ? Mom.Since last visit had blood woirk last week and wbc nadir. Had associatd "usual fatigue of low wbc". But then father in law died and she was busy so never recovered from fatigue. Has new wheeze, sinus congestion and increased cruds. Feels she has sinusitis. Wants abx before cycle #4 cytoxan next week. She is worried about COVID-19 ans asked for advice. Otherwise well. Symptom score below      OV 10/20/2018  Subjective:  Patient ID: Tasha Ballard, female , DOB: 1971/01/21 , age 27 y.o. , MRN: [id number] , ADDRESS: [place]  [place] [address]     10/20/2018 -   Chief Complaint  Patient presents with  . ILD (interstitial lung disease) ([place])     HPI SHANITA KANAN 49 y.o. -presents for follow-up of the above issues.  She reports that over summer 2020 her husband nearly got exposed to someone with COVID this was in the outdoor setting.  She never ended up getting COVID.  On October 22, 2018 she tested negative for COVID. On October 04, 2018 she had a virtual visit with nurse practitioner for sinus complaints.  She took a 10-day course of Augmentin.  She was taking this along with Bactrim despite instructions to hold Bactrim.  This gave her immense diarrhea.  She is also reporting that the diarrhea has now improved after finishing the Augmentin course.  However she is having fatigue for the last 1 month.  She feels this because of the oral Cytoxan.  She wants to switch to IV Cytoxan.  She is in need of a flu shot and is willing to have that.  Her Port-A-Cath site is fine.  Currently she is on prednisone, oral Cytoxan, Bactrim prophylaxis and nintedanib.  She maintains herself on 6 L.  Her symptom score and oxygen needs continue to be stable.  Obesity continues to be a barrier for transplant.  In terms of her ILD her last PFT was in February 2020 and CT scan was in  Nov 2019.      OV 01/03/2019  Subjective:  Patient ID: Tasha Ballard, female , DOB: October 09, 1970 , age 61 y.o. , MRN: [id number] , ADDRESS: [address] [place] [address]    01/03/2019 -   Chief Complaint  Patient presents with  . Follow-up    Pt states she has been doing good since last visit and states breathing has been doing well. Pt is doing 6L O2 sitting and 8-10L with exertion.     HPI ANNAJULIA LEWING 49 y.o. -presents for follow-up of the above medical problems.  Last visit was in end of August 2020.  At that point in time we decided to restart her IV Cytoxan because she did not want to do the oral Cytoxan in the midst of the pandemic because  this was making her fatigued and nauseated more.  She is now finished 2 cycles of IV Cytoxan both of the lower dose.  The first cycle was end of September 2020 but after that it looks like she developed some UTI symptoms although the culture did not grow anything.  We gave her antibiotics.  Therefore the second cycle was also at the lower dose.  She is willing to go up to the third cycle.  She feels her quality of life is much better with the IV Cytoxan cyclical regimen.  At this point in time she is finished 1 year of Cytoxan and also 1 year of nintedanib.  Overall she feels stable.  She uses oxygen with rest and exertion fluctuating between 6 and 8 L.  She feels a combination of the 2 has helped although she thinks maybe the nintedanib has helped her more.  She wants to have a repeat Pneumovax today.  She was asking about the Shingrix vaccine which is a live attenuated vaccine.  Her last blood draw was December 29, 2018 with [name] and this was reviewed and is normal although the potassium is 3.5 and magnesium is 1.8 which is slightly below target.  One of her questions was about how long to  continue the Cytoxan regimen.  I will reach out to [facility] for this.  In addition she feels she is hit menopause because she has been having a lot of night sweats.  She is been started on Premarin.  She is also lost some weight.  She continues prednisone at 10 mg/day.  Her symptom scores below shows stability.  Her lung function shows stability.  CT scan shows presence of fibrosis but this no mention about progression.     OV 02/08/2019  Subjective:  Patient ID: Tasha Ballard, female , DOB: Oct 07, 1970 , age 24 y.o. , MRN: [id number] , ADDRESS: [place] [address]  #Morbid Obesity - needs weight loss for transplant - sees Dr Janeal Holmes since dec 2019 #PRogressive NSIP #Chronic hypoxemic resp failure - 4L at rest, 8L with exertion in dec 2019 - dec 2020 6 to 8/12L # High risk treatment  regimen  -CellCept 2013 through 2019  -Chronic prednisone 2013 -currently taking -Bactrim prophylaxis 2013 --currently taking  - Cytoxan Oral  47m per day 12/23/17 and increaed to 103mon 12/23/17.On11/19/19. increase to 12560mer day   - Cytoxan IV : And switched to IV cytoxan Q4 week cycle   - 1st cycle 02/03/18 (s/p portocath 01/26/18)  = 2nd cycle 03/06/2018  - 3rd cycle  -  04/03/2018  - 4th cycle - 05/01/2018 - Cytoxan oral (due to pandemic covid-19 and need to avoid infusion visits) - spring 2020 - BAck on IV Cytoxan -   - 5th cycle - end sept 2020  - 6th cycle - end oct 2020  - 7th  Cycle - end nov 2020 (full dose) 01/17/2019   - Ofev 01/23/18 (approximnate)  - Lung transplant needed: Obesity Barrier :  Weight 231#  11/22/17  -> 221# 01/17/2018., -> 222# on 03/02/2018 -> 220# on 03/30/2018 -> 223# on 04/26/2018 -> 224# on 10/20/2018 -> 219# on 01/03/2019 -> 222#   02/08/2019 -   Chief Complaint  Patient presents with  . Follow-up    Pt has had worsening SOB and also has had increased fatigue and weakness. Pt was tested for covid and was negative.     HPI ChrARIAH MOWER 21o. 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  I last saw her in mid November 2020.  After that on January 17, 2019 she underwent Cytoxan infusion.  The particular infusion was an escalation in dose.  She says immediately after that a few hours after that infusion she started having diarrhea.  That was extremely bad.  She called into our office on January 25, 2019 and spoke to the nurse practitioner.  She was asked to hold her nintedanib and Metformin.  She was also asked to hold her Bactrim.  She was given a short course of Diflucan.  After this the diarrhea improved.  Somewhere along the way she got started on Premarin for hot flashes.  It seems she was stabilizing after that but then she had a video visit with the nurse practitioner February 05 2019, 3 days ago.  During  this visit she reported that since December Leven 2020 she was desaturating much easier.  She finished a shower and started feeling she was going to blackout.  Just a few weeks ago she was able to do 14 minutes on a treadmill on 6 L before she desaturated but then on February 02, 2019 she was desaturating to 83% even on 12 L nasal cannula.  She was having sinus symptoms.  She was Covid tested and this was negative.  Nurse practitioner advised her to stop her Premarin.  She also gave her Omnicef for sinusitis.  She increased the prednisone to 20 mg for 5 days which patient is currently on.  After this the patient is supposed to go back on prednisone 10 mg/day.  She says she is now beginning to feel better.  The oxygen levels have stabilized.  But she is having extreme fatigue.  Her primary is on hold.  Symptom scores are listed below.  She is now with her husband to talk future direction in her care.  She is worried about her ability to handle Cytoxan.  She is also worried about her interstitial lung disease progressing.  She has a Port-A-Cath.   Results for Sox, MYKALA MCCREADY" (MRN [id number]) as of 02/08/2019 09:44  Ref. Range 01/26/2019 09:32 02/01/2019 09:44  WBC Latest Ref Range: 4.0 - 10.5 K/uL 1.7 Repeated and verified X2. (LL) 2.6 (L)     SYMPTOM SCALE - ILD 03/30/2018  04/26/2018  10/20/2018  01/03/2019  02/08/2019   O2 use  4 L at rest, 8 L with exertion Same but prefers 6L at all times 6L  6L at rest, 8-10 L with exertion 6L rest -> 93%  Shortness of Breath 0 -> 5 scale with 5 being worst (score 6 If unable to do)      At rest 0 1 0 0 0  Simple tasks - showers, clothes change, eating, shaving 1 3.5 3.5 3.5 2  Household (dishes, doing bed, laundry) 2.5 4.5 3._0 Shopping 3 4.5 3._1 Walking level at own pace 3 3.5 3.5 3.5 4  Walking keeping up with others of same age _2 4.5 5  Walking up Stairs 4 5 4._3 Walking up Hill 4 5 4._4 Total (40 - 48) Dyspnea Score 21.5  32 28 29.5 29  How bad is your cough? x 2.5 x mild worse  How bad is your fatigue 5 ("very, lol" 4 Extreme 5+ "lot" awful   Results for Daquila, MODELL FENDRICK" (MRN [id number]) as of 01/03/2019 10:52  Ref. Range 03/30/2018 11:50 11/20/2018 15:14  FVC-Pre Latest Units: L 1.09 1.16  FVC-%Pred-Pre Latest Units: % 32 35  Results for Longnecker, Kavita Y "[name]" (MRN [id number]) as of 01/03/2019 10:52  Ref. Range 03/30/2018 11:50 11/20/2018 15:14  DLCO unc Latest Units: ml/min/mmHg 6.85 7.29  DLCO unc % pred Latest Units: % 34 37     CT CHest High Resolution 11/20/2018  IMPRESSION: 1. The appearance of the lungs is considered probable usual interstitial pneumonia (UIP) per current ATS guidelines. Repeat high-resolution chest CT is recommended in 12 months to assess for temporal changes in the appearance of the lung parenchyma. 2. Aortic atherosclerosis.  Aortic Atherosclerosis (ICD10-I70.0).   Electronically Signed   By: Vinnie Langton M.D.   On: 11/20/2018 14:16   OV 07/20/2019  Subjective:  Patient ID: Tasha Ballard, female , DOB: [birth year] , age 15 y.o. , MRN: [id number] , ADDRESS: [place] [id number]  #Morbid Obesity - needs weight loss for transplant - sees Dr Janeal Holmes since dec 2019  - Lung transplant needed: Obesity Barrier :  Weight 231#  11/22/17  -> 221# 01/17/2018., -> 222# on 03/02/2018 -> 220# on 03/30/2018 -> 223# on 04/26/2018 -> 224# on 10/20/2018 -> 219# on 01/03/2019 -> 222#    #  PRogressive NSIP #Chronic hypoxemic resp failure - 4L at rest, 8L with exertion in dec 2019 - dec 2020 6 to 8/12L # High risk treatment regimen  -CellCept 2013 through 2019  -Chronic prednisone 2013 -currently taking -Bactrim prophylaxis 2013 --currently taking  - Cytoxan Oral  73m per day 12/23/17 and increaed to 1075mon 12/23/17.On11/19/19. increase to 12534mer day   - Cytoxan IV : And switched to IV cytoxan Q4 week cycle   - 1st cycle 02/03/18 (s/p portocath  01/26/18)  = 2nd cycle 03/06/2018  - 3rd cycle  -  04/03/2018  - 4th cycle - 05/01/2018 - Cytoxan oral (due to pandemic covid-19 and need to avoid infusion visits) - spring 2020 - BAck on IV Cytoxan -   - 5th cycle - end sept 2020  - 6th cycle - end oct 2020  - 7th  Cycle - end nov 2020 (full dose) 01/17/2019 -. Stopped - Immuran  0 approx jan 2021  - Rehab + spring 2021 onwauys    - OfeDickey Gave/2/19 (approximnate) - Esbriet -early-mid April 2021  #Nodule of lower lobe of left lung - new march 2021  - biodesk negative  - enarlged to  1.5cm may 20201   07/20/2019 -this is a telephone visit.  Patient identified with 2 person identifier.  Risks, benefits and limitations of telephone visit explained.   HPI ChrTORUNN CHANCELLOR 34o. -this visit is to review the findings on the latest CT scan of the chest.  Her ILD itself is stable.  She says that she is feeling fine on 7 8 L.  [facility].  She is tolerating both combination of Ofev and Esbriet quite well.  Her liver function test Jul 03, 2019 was normal.  She is also on Imuran and Bactrim and prednisone.  The CT scan of the chest shows worsening of the 1.5 cm left lower lobe nodule.  She is very worried that this could be cancer.  She wants a referral to a GYN specialist because of recurrent UTI.    IMPRESSION: CT chest high res 1. Interval growth of left lower lobe nodular density which currently measures 1.5 x 1.3 cm (previously 1.0 cm). Further evaluation with PET-CT should be considered. 2. The appearance of the lungs remains compatible with usual interstitial pneumonia (UIP) per current ATS guidelines. No progression of disease compared to the prior study. 3. Aortic atherosclerosis.  These results will be called to the ordering clinician or representative by the Radiologist Assistant, and communication documented in the PACS or ClaFrontier Oil CorporationAortic Atherosclerosis  (ICD10-I70.0).   Electronically Signed   By: DanVinnie LangtonD.   On: 07/19/2019 12:39   ROS - per HPI     has a past medical history of Aneurysm ([place]ville[place] pain, Diabetes mellitus without complication (HCCFort HancockH/O blood clots, Hypertension, Joint pain, Pulmonary fibrosis (HCC), SOB (shortness of breath), Swelling, Thyroid disease, and Vitamin D deficiency.   reports that she has never smoked. She has never used smokeless tobacco.  Past Surgical History:  Procedure Laterality Date  . ABLATION    . IR IMAGING GUIDED PORT INSERTION  01/26/2018  . KNEE SURGERY    . LUNG BIOPSY      Allergies  Allergen Reactions  . Macrobid [Nitrofurantoin Macrocrystal] Nausea And Vomiting  . Adhesive [Tape] Rash    Immunization History  Administered Date(s) Administered  . Influenza,inj,Quad PF,6+ Mos 12/24/2014, 02/05/2016, 01/07/2017, 11/22/2017, 10/20/2018  .  Influenza-Unspecified 12/24/2014, 02/05/2016, 01/07/2017, 11/22/2017  . PFIZER SARS-COV-2 Vaccination 04/06/2019, 04/30/2019  . Pneumococcal Conjugate-13 07/20/2017  . Pneumococcal Polysaccharide-23 06/09/2012, 01/03/2019  . Tdap 02/19/2011    Family History  Problem Relation Age of Onset  . Heart failure Mother   . Heart disease Mother   . Rheumatologic disease Father      Current Outpatient Medications:  .  azathioprine (IMURAN) 100 MG tablet, Take 1 tablet (100 mg total) by mouth daily., Disp: 90 tablet, Rfl: 3 .  azaTHIOprine (IMURAN) 50 MG tablet, Take 1 tablet (50 mg) by mouth daily for 2 weeks then increase to 2 tablets (141m) daily., Disp: 180 tablet, Rfl: 0 .  benzonatate (TESSALON) 200 MG capsule, Take 1 capsule (200 mg total) by mouth 3 (three) times daily as needed for cough., Disp: 30 capsule, Rfl: 1 .  carvedilol (COREG) 6.25 MG tablet, Take 6.25 mg by mouth 2 (two) times daily with a meal., Disp: , Rfl:  .  cephALEXin (KEFLEX) 500 MG capsule, Take 1 capsule (500 mg total) by mouth 2 (two) times daily.,  Disp: 6 capsule, Rfl: 0 .  clindamycin (CLEOCIN T) 1 % lotion, , Disp: , Rfl:  .  famotidine (PEPCID) 40 MG tablet, Take 1 tablet (40 mg total) by mouth daily., Disp: 90 tablet, Rfl: 0 .  furosemide (LASIX) 40 MG tablet, Take 40 mg by mouth See admin instructions. Take 1 tablet (40 mg) daily, may repeat dose if needed for fluid retention., Disp: , Rfl:  .  gabapentin (NEURONTIN) 100 MG capsule, Take 1 capsule by mouth as needed., Disp: , Rfl:  .  Magnesium Oxide 400 (240 Mg) MG TABS, TAKE 1 TABLET BY MOUTH EACH DAY, Disp: 30 tablet, Rfl: 5 .  metFORMIN (GLUCOPHAGE) 500 MG tablet, Take 500 mg by mouth 2 (two) times daily. , Disp: , Rfl:  .  montelukast (SINGULAIR) 10 MG tablet, Take 1 tablet (10 mg total) by mouth daily after supper., Disp: 90 tablet, Rfl: 1 .  OFEV 150 MG CAPS, Take 1 capsule (150 mg total) by mouth 2 (two) times daily., Disp: 60 capsule, Rfl: 11 .  ondansetron (ZOFRAN) 8 MG tablet, TAKE 1 TABLET BY MOUTH EVERY 8 HOURS AS NEEDED FOR NAUSEA AND VOMITING, Disp: 45 tablet, Rfl: 2 .  Pirfenidone (ESBRIET) 801 MG TABS, Take 1 tablet by mouth 3 (three) times daily with meals., Disp: 90 tablet, Rfl: 1 .  potassium chloride (KLOR-CON) 10 MEQ tablet, TAKE 1 TABLET BY MOUTH 2 TIMES A DAY, Disp: 120 tablet, Rfl: 2 .  predniSONE (DELTASONE) 10 MG tablet, Take 10-243mas needed for SOB, Disp: 30 tablet, Rfl: 3 .  PROAIR HFA 108 (90 Base) MCG/ACT inhaler, Inhale 2 puffs into the lungs every 6 (six) hours as needed for wheezing or shortness of breath., Disp: 18 g, Rfl: 5 .  saccharomyces boulardii (FLORASTOR) 250 MG capsule, Take 1 capsule (250 mg total) by mouth 2 (two) times daily., Disp: 60 capsule, Rfl: 0 .  spironolactone (ALDACTONE) 100 MG tablet, Take 100 mg by mouth daily., Disp: , Rfl:  .  sulfamethoxazole-trimethoprim (BACTRIM) 400-80 MG tablet, Take 1 tablet by mouth every Monday, Wednesday, and Friday., Disp: , Rfl:  .  thyroid (ARMOUR) 90 MG tablet, Take 90 mg by mouth daily before  breakfast. , Disp: , Rfl:  .  Vitamin D, Ergocalciferol, (DRISDOL) 1.25 MG (50000 UT) CAPS capsule, TAKE 1 CAPSULE BY MOUTH ONCE WEEKLY, Disp: 3 capsule, Rfl: 0      Objective:  There were no vitals filed for this visit.  Estimated body mass index is 40 kg/m as calculated from the following:   Height as of 05/28/19: _0  (1.575 m).   Weight as of 06/12/19: 218 lb 11.1 oz (99.2 kg).  _1 @  There were no vitals filed for this visit.   Physical Exam sounded normal but despondent on the phone    Assessment:       ICD-10-CM   1. Nodule of lower lobe of left lung  R91.1   2. Recurrent UTI  N39.0        Plan:     Patient Instructions   NSIP (nonspecific interstitial pneumonia) (HCC) Chronic respiratory failure with hypoxia (HCC) High risk medication use Therapeutic drug monitoring  -clinically  Stable  Plan  - - continue o2  - cotninue ofev and ebriet combined  - continue immuran, bactrim, prednisone as before   Nodule of lower lobe of left lung - new march 2021 -> increased/worse to 1.5cm [id number]   Plan  - check PET scan  REcurrent UTI  - refer GYN    Followup -cancel upcoming appointment with Patricia Nettle -Schedule telephone visit with Dr. [name] or Patricia Nettle immediately after the PET scan to decide the next steps     SIGNATURE    Dr. Brand Males, M.D., F.C.C.P,  Pulmonary and Critical Care Medicine Staff Physician, [place] Director - Interstitial Lung Disease  Program  Pulmonary [place] [facility] [place], [place], [address]  Pager: [phone], If no answer or between  15:00h - 7:00h: call [phone] Telephone: [phone]  9:38 AM 07/20/2019

## 2019-07-20 NOTE — Telephone Encounter (Signed)
Patrice, please see message from MR about getting pt added on his schedule today in [place] for a televisit.

## 2019-07-20 NOTE — Telephone Encounter (Signed)
Just checked pt's chart and saw that an OV did already take place between MR and her for a televisit. This encounter can be closed.

## 2019-07-20 NOTE — Telephone Encounter (Signed)
Called to try to schedule patient for televisit with Dr. [name] today - spoke to patient who stated that Dr. [name] has already called her and spoke with her. Is there anything else that needs to be scheduled? Thanks - pr

## 2019-07-20 NOTE — Patient Instructions (Addendum)
  NSIP (nonspecific interstitial pneumonia) (HCC) Chronic respiratory failure with hypoxia (HCC) High risk medication use Therapeutic drug monitoring  -clinically  Stable  Plan  - - continue o2  - cotninue ofev and ebriet combined  - continue immuran, bactrim, prednisone as before   Nodule of lower lobe of left lung - new march 2021 -> increased/worse to 1.5cm [id number]   Plan  - check PET scan  REcurrent UTI  - refer GYN    Followup -cancel upcoming appointment with Patricia Nettle -Schedule telephone visit with Dr. [name] or Patricia Nettle immediately after the PET scan to decide the next steps

## 2019-07-24 ENCOUNTER — Ambulatory Visit: Payer: BC Managed Care – PPO | Admitting: Adult Health

## 2019-07-24 ENCOUNTER — Other Ambulatory Visit: Payer: Self-pay

## 2019-07-24 ENCOUNTER — Ambulatory Visit (HOSPITAL_COMMUNITY)
Admission: RE | Admit: 2019-07-24 | Discharge: 2019-07-24 | Disposition: A | Discharge status: Home / Self Care | Payer: BC Managed Care – PPO | Source: Ambulatory Visit | Attending: Internal Medicine | Admitting: Internal Medicine

## 2019-07-24 ENCOUNTER — Encounter (HOSPITAL_COMMUNITY)
Admission: RE | Admit: 2019-07-24 | Discharge: 2019-07-24 | Disposition: A | Discharge status: Home / Self Care | Payer: Self-pay | Source: Ambulatory Visit | Attending: Internal Medicine | Admitting: Internal Medicine

## 2019-07-24 DIAGNOSIS — Z452 Encounter for adjustment and management of vascular access device: Secondary | ICD-10-CM | POA: Insufficient documentation

## 2019-07-24 DIAGNOSIS — J84112 Idiopathic pulmonary fibrosis: Secondary | ICD-10-CM | POA: Insufficient documentation

## 2019-07-24 MED ORDER — HEPARIN SOD (PORK) LOCK FLUSH 100 UNIT/ML IV SOLN
500.0000 [IU] | INTRAVENOUS | Status: AC | PRN
  Administered 2019-07-24: 500 [IU]

## 2019-07-24 MED ORDER — SODIUM CHLORIDE 0.9% FLUSH
10.0000 mL | INTRAVENOUS | Status: AC | PRN
  Administered 2019-07-24: 10 mL

## 2019-07-24 NOTE — Progress Notes (Signed)
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 PAC flushed with Heparin and 0.9% Sodium Chloride and de-accessed. Patient alert, oriented and ambulatory at discharge.             

## 2019-07-26 ENCOUNTER — Telehealth (HOSPITAL_COMMUNITY): Payer: Self-pay | Admitting: Family Medicine

## 2019-07-26 ENCOUNTER — Encounter (HOSPITAL_COMMUNITY): Payer: Self-pay

## 2019-07-26 ENCOUNTER — Ambulatory Visit
Admission: RE | Admit: 2019-07-26 | Discharge: 2019-07-26 | Disposition: A | Discharge status: Home / Self Care | Payer: BC Managed Care – PPO | Source: Ambulatory Visit | Attending: Internal Medicine | Admitting: Internal Medicine

## 2019-07-26 ENCOUNTER — Other Ambulatory Visit: Payer: Self-pay

## 2019-07-26 DIAGNOSIS — R911 Solitary pulmonary nodule: Secondary | ICD-10-CM | POA: Diagnosis not present

## 2019-07-26 DIAGNOSIS — R918 Other nonspecific abnormal finding of lung field: Secondary | ICD-10-CM | POA: Diagnosis present

## 2019-07-26 LAB — GLUCOSE, CAPILLARY: Glucose-Capillary: 116 mg/dL — ABNORMAL HIGH (ref 70–99)

## 2019-07-26 MED ORDER — FLUDEOXYGLUCOSE F - 18 (FDG) INJECTION
11.6800 | Freq: Once | INTRAVENOUS | Status: AC | PRN
  Administered 2019-07-26: 11.68 via INTRAVENOUS

## 2019-07-27 ENCOUNTER — Ambulatory Visit (INDEPENDENT_AMBULATORY_CARE_PROVIDER_SITE_OTHER): Payer: BC Managed Care – PPO | Admitting: Internal Medicine

## 2019-07-27 ENCOUNTER — Telehealth: Payer: Self-pay | Admitting: Internal Medicine

## 2019-07-27 DIAGNOSIS — R911 Solitary pulmonary nodule: Secondary | ICD-10-CM

## 2019-07-27 MED ORDER — ALPRAZOLAM 0.25 MG PO TABS
0.2500 mg | ORAL_TABLET | Freq: Every evening | ORAL | 1 refills | Status: DC | PRN
Start: 2019-07-27 — End: 2019-08-28

## 2019-07-27 MED ORDER — CEPHALEXIN 500 MG PO CAPS: 500.0000 mg | ORAL_CAPSULE | Freq: Four times a day (QID) | ORAL | 0 refills | Status: DC

## 2019-07-27 NOTE — Telephone Encounter (Signed)
For anxiety: She can try Xanax 0.25 mg once at night as needed.  You can give 14-day supply with 1 refill  For urinary tract infection treatment: Cephalexin 500 mg every 6 hours for 5 days

## 2019-07-27 NOTE — Progress Notes (Addendum)
OV 07/27/2019 - telephone visit  Type of visit: Telephone/Video Circumstance: COVID-19 national emergency Identification of patient Tasha Ballard with 11-25-1970 and MRN [id number] - 2 person identifier Risks: Risks, benefits, limitations of telephone visit explained. Patient understood and verbalized agreement to proceed Anyone else on call: none Patient location: home This provider location: [facility] [place]   Subjective:  Patient ID: Tasha Ballard, female , DOB: 06-23-1970 , age 49 y.o. , MRN: [id number] , ADDRESS: [place] [address] #Morbid Obesity - needs weight loss for transplant - sees Dr Janeal Holmes since dec 2019  - Lung transplant needed: Obesity Barrier :  Weight 231#  11/22/17  -> 221# 01/17/2018., -> 222# on 03/02/2018 -> 220# on 03/30/2018 -> 223# on 04/26/2018 -> 224# on 10/20/2018 -> 219# on 01/03/2019 -> 222#    #PRogressive NSIP #Chronic hypoxemic resp failure - 4L at rest, 8L with exertion in dec 2019 - dec 2020 6 to 8/12L # High risk treatment regimen  -CellCept 2013 through 2019  -Chronic prednisone 2013 -currently taking -Bactrim prophylaxis 2013 --currently taking  - Cytoxan Oral  59m per day 12/23/17 and increaed to 1053mon 12/23/17.On11/19/19. increase to 12553mer day   - Cytoxan IV : And switched to IV cytoxan Q4 week cycle   - 1st cycle 02/03/18 (s/p portocath 01/26/18)  = 2nd cycle 03/06/2018  - 3rd cycle  -  04/03/2018  - 4th cycle - 05/01/2018 - Cytoxan oral (due to pandemic covid-19 and need to avoid infusion visits) - spring 2020 - BAck on IV Cytoxan -   - 5th cycle - end sept 2020  - 6th cycle - end oct 2020  - 7th  Cycle - end nov 2020 (full dose) 01/17/2019 -. Stopped - Immuran  0 approx jan 2021  - Rehab + spring 2021 onwauys    - OfeDickey Gave/2/19 (approximnate) - Esbriet -early-mid April 2021  #Nodule of lower lobe of left lung - new march 2021  - biodesk negative  - enarlged to  1.5cm may 20201  - PET HOT   07/27/2019 - telephone visit. Patient identifed with 2PHI   HPI Tasha Ballard 63o. - to discuss below results. rEsults given. She is very anxious and sad about the results     NM PET Image Initial (PI) Skull Base To Thigh  Result Date: 07/27/2019 CLINICAL DATA:  Initial treatment strategy for left lower lobe nodule. EXAM: NUCLEAR MEDICINE PET SKULL BASE TO THIGH TECHNIQUE: 11.7 mCi F-18 FDG was injected intravenously. Full-ring PET imaging was performed from the skull base to thigh after the radiotracer. CT data was obtained and used for attenuation correction and anatomic localization. Fasting blood glucose: 116 mg/dl COMPARISON:  CT chest dated 07/19/2019 FINDINGS: Mediastinal blood pool activity: SUV max 4.2 Liver activity: SUV max NA NECK: No hypermetabolic cervical lymphadenopathy. Incidental CT findings: none CHEST: 15 mm nodule in the medial left lower lobe (series 3/image 118), max SUV 7.7, compatible primary bronchogenic neoplasm. 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 No hypermetabolic thoracic lymphadenopathy. Incidental CT findings: Right chest port terminates the cavoatrial junction. Mild atherosclerotic calcifications of the aortic arch. ABDOMEN/PELVIS: No abnormal hypermetabolism in the liver, spleen, pancreas, or adrenal glands. No hypermetabolic abdominopelvic lymphadenopathy. Incidental CT findings: GDA embolization coil. SMA stent. Mild atherosclerotic calcifications of the abdominal aorta and branch vessels. SKELETON: No focal hypermetabolic activity to suggest  skeletal metastasis. Incidental CT findings: Degenerative changes of the visualized thoracolumbar spine. IMPRESSION: 15 mm nodule in the medial left lower lobe, compatible primary bronchogenic neoplasm. No findings suspicious for metastatic disease. Electronically Signed   By: Julian Hy M.D.   On: 07/27/2019 10:06       ROS - per HPI     has a past medical history of Aneurysm ([place]), Back pain, Diabetes mellitus without complication ([place]), H/O blood clots, Hypertension, Joint pain, Pulmonary fibrosis (HCC), SOB (shortness of breath), Swelling, Thyroid disease, and Vitamin D deficiency.   reports that she has never smoked. She has never used smokeless tobacco.  Past Surgical History:  Procedure Laterality Date  . ABLATION    . IR IMAGING GUIDED PORT INSERTION  01/26/2018  . KNEE SURGERY    . LUNG BIOPSY      Allergies  Allergen Reactions  . Macrobid [Nitrofurantoin Macrocrystal] Nausea And Vomiting  . Adhesive [Tape] Rash    Immunization History  Administered Date(s) Administered  . Influenza,inj,Quad PF,6+ Mos 12/24/2014, 02/05/2016, 01/07/2017, 11/22/2017, 10/20/2018  . Influenza-Unspecified 12/24/2014, 02/05/2016, 01/07/2017, 11/22/2017  . PFIZER SARS-COV-2 Vaccination 04/06/2019, 04/30/2019  . Pneumococcal Conjugate-13 07/20/2017  . Pneumococcal Polysaccharide-23 06/09/2012, 01/03/2019  . Tdap 02/19/2011    Family History  Problem Relation Age of Onset  . Heart failure Mother   . Heart disease Mother   . Rheumatologic disease Father      Current Outpatient Medications:  .  azathioprine (IMURAN) 100 MG tablet, Take 1 tablet (100 mg total) by mouth daily., Disp: 90 tablet, Rfl: 3 .  azaTHIOprine (IMURAN) 50 MG tablet, Take 1 tablet (50 mg) by mouth daily for 2 weeks then increase to 2 tablets (132m) daily., Disp: 180 tablet, Rfl: 0 .  benzonatate (TESSALON) 200 MG capsule, Take 1 capsule (200 mg total) by mouth 3 (three) times daily as needed for cough., Disp: 30 capsule, Rfl: 1 .  carvedilol (COREG) 6.25 MG tablet, Take 6.25 mg by mouth 2 (two) times daily with a meal., Disp: , Rfl:  .  cephALEXin (KEFLEX) 500 MG capsule, Take 1 capsule (500 mg total) by mouth 2 (two) times daily., Disp: 6 capsule, Rfl: 0 .  clindamycin (CLEOCIN T) 1 % lotion, , Disp: , Rfl:  .  famotidine  (PEPCID) 40 MG tablet, Take 1 tablet (40 mg total) by mouth daily., Disp: 90 tablet, Rfl: 0 .  furosemide (LASIX) 40 MG tablet, Take 40 mg by mouth See admin instructions. Take 1 tablet (40 mg) daily, may repeat dose if needed for fluid retention., Disp: , Rfl:  .  gabapentin (NEURONTIN) 100 MG capsule, Take 1 capsule by mouth as needed., Disp: , Rfl:  .  Magnesium Oxide 400 (240 Mg) MG TABS, TAKE 1 TABLET BY MOUTH EACH DAY, Disp: 30 tablet, Rfl: 5 .  metFORMIN (GLUCOPHAGE) 500 MG tablet, Take 500 mg by mouth 2 (two) times daily. , Disp: , Rfl:  .  montelukast (SINGULAIR) 10 MG tablet, Take 1 tablet (10 mg total) by mouth daily after supper., Disp: 90 tablet, Rfl: 1 .  OFEV 150 MG CAPS, Take 1 capsule (150 mg total) by mouth 2 (two) times daily., Disp: 60 capsule, Rfl: 11 .  ondansetron (ZOFRAN) 8 MG tablet, TAKE 1 TABLET BY MOUTH EVERY 8 HOURS AS NEEDED FOR NAUSEA AND VOMITING, Disp: 45 tablet, Rfl: 2 .  Pirfenidone (ESBRIET) 801 MG TABS, Take 1 tablet by mouth 3 (three) times daily with meals., Disp: 90 tablet, Rfl: 1 .  potassium  chloride (KLOR-CON) 10 MEQ tablet, TAKE 1 TABLET BY MOUTH 2 TIMES A DAY, Disp: 120 tablet, Rfl: 2 .  predniSONE (DELTASONE) 10 MG tablet, Take 10-31m as needed for SOB, Disp: 30 tablet, Rfl: 3 .  PROAIR HFA 108 (90 Base) MCG/ACT inhaler, Inhale 2 puffs into the lungs every 6 (six) hours as needed for wheezing or shortness of breath., Disp: 18 g, Rfl: 5 .  saccharomyces boulardii (FLORASTOR) 250 MG capsule, Take 1 capsule (250 mg total) by mouth 2 (two) times daily., Disp: 60 capsule, Rfl: 0 .  spironolactone (ALDACTONE) 100 MG tablet, Take 100 mg by mouth daily., Disp: , Rfl:  .  sulfamethoxazole-trimethoprim (BACTRIM) 400-80 MG tablet, Take 1 tablet by mouth every Monday, Wednesday, and Friday., Disp: , Rfl:  .  thyroid (ARMOUR) 90 MG tablet, Take 90 mg by mouth daily before breakfast. , Disp: , Rfl:  .  Vitamin D, Ergocalciferol, (DRISDOL) 1.25 MG (50000 UT) CAPS  capsule, TAKE 1 CAPSULE BY MOUTH ONCE WEEKLY, Disp: 3 capsule, Rfl: 0      Objective:   There were no vitals filed for this visit.  Estimated body mass index is 40 kg/m as calculated from the following:   Height as of 05/28/19: 5' 2" (1.575 m).   Weight as of 06/12/19: 218 lb 11.1 oz (99.2 kg).  _0 @  There were no vitals filed for this visit.   Physical Exam No distress on phone       Assessment:       ICD-10-CM   1. Nodule of lower lobe of left lung  R91.1        Plan:     Patient Instructions     ICD-10-CM   1. Nodule of lower lobe of left lung  R91.1    Discussed that bronch method for biopsy - can be risky given high o2 need and lower diagnosis due to distal nature. Other method of CT guided TTNA can also be risky given her advnce dlung fibrosis.  But decision to be made by experts  Empriic XRT should be a consideration with caveat that fibrosis could get worse but we might not have a choice  Plan  - refer MMalonenavigator DNorton Blizzardto discuss at conference  - also refer Rad Onc MD for evaluation of empiric XRT  Followup  =- July 2021 for 30 min face to face visit     SIGNATURE    Dr. MBrand Males M.D., F.C.C.P,  Pulmonary and Critical Care Medicine Staff Physician, [place] - Interstitial Lung Disease  Program  [place] [id number] Pager: [phone] If no answer or between  15:00h - 7:00h: call [phone] Telephone: [phone]  12:17 PM 07/27/2019

## 2019-07-27 NOTE — Patient Instructions (Signed)
ICD-10-CM   1. Nodule of lower lobe of left lung  R91.1    Discussed that bronch method for biopsy - can be risky given high o2 need and lower diagnosis due to distal nature. Other method of CT guided TTNA can also be risky given her advnce dlung fibrosis.  But decision to be made by experts  Empriic XRT should be a consideration with caveat that fibrosis could get worse but we might not have a choice  Plan  - refer [place] navigator Norton Blizzard to discuss at conference  - also refer Rad Onc MD for evaluation of empiric XRT  Followup  =- July 2021 for 30 min face to face visit

## 2019-07-27 NOTE — Telephone Encounter (Signed)
Pt had televisit today with MR 07/27/19.  Plan: -Refer [place] navigator Norton Blizzard to discuss at conference. Order has been placed. Also routing this to [employer] as an [place]. -Refer to Radiation Oncology MD for evaluation of empiric XRT. Order has been placed  Called and spoke with pt letting her know that the referral that was discussed by MR has been placed and she verbalized understanding. Also scheduled pt a follow up with MR July 20.  While speaking with pt, she stated that TP had recently prescribed an abx for UTI and a referral for GYN was also placed for her to be established. Pt isn't able to get an appt with GYN until mid/late July. Pt states she feels like she has another UTI and is wondering if she could have a refill of abx.  Pt is also asking if she can have an Rx for anxiety with all that is currently going on. MR, please advise on this.

## 2019-07-27 NOTE — Progress Notes (Signed)
Did tele visit - Emily pls follow instructions from tele visit  NM PET Image Initial (PI) Skull Base To Thigh  Result Date: 07/27/2019 CLINICAL DATA:  Initial treatment strategy for left lower lobe nodule. EXAM: NUCLEAR MEDICINE PET SKULL BASE TO THIGH TECHNIQUE: 11.7 mCi F-18 FDG was injected intravenously. Full-ring PET imaging was performed from the skull base to thigh after the radiotracer. CT data was obtained and used for attenuation correction and anatomic localization. Fasting blood glucose: 116 mg/dl COMPARISON:  CT chest dated 07/19/2019 FINDINGS: Mediastinal blood pool activity: SUV max 4.2 Liver activity: SUV max NA NECK: No hypermetabolic cervical lymphadenopathy. Incidental CT findings: none CHEST: 15 mm nodule in the medial left lower lobe (series 3/image 118), max SUV 7.7, compatible primary bronchogenic neoplasm. 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 No hypermetabolic thoracic lymphadenopathy. Incidental CT findings: Right chest port terminates the cavoatrial junction. Mild atherosclerotic calcifications of the aortic arch. ABDOMEN/PELVIS: No abnormal hypermetabolism in the liver, spleen, pancreas, or adrenal glands. No hypermetabolic abdominopelvic lymphadenopathy. Incidental CT findings: GDA embolization coil. SMA stent. Mild atherosclerotic calcifications of the abdominal aorta and branch vessels. SKELETON: No focal hypermetabolic activity to suggest skeletal metastasis. Incidental CT findings: Degenerative changes of the visualized thoracolumbar spine. IMPRESSION: 15 mm nodule in the medial left lower lobe, compatible primary bronchogenic neoplasm. No findings suspicious for metastatic disease. Electronically Signed   By: Julian Hy M.D.   On: 07/27/2019 10:06

## 2019-07-27 NOTE — Telephone Encounter (Signed)
Called and spoke with pt letting her know that we were going to prescribe meds for UTI and also meds for anxiety and she verbalized understanding. Rx for cephalexin was sent electronically to pharmacy and called [facility] and spoke with pharmacist Lytle Michaels giving him verbal Rx info for the xanax.  Nothing further needed.  Routing this encounter to Norton Blizzard so she is aware of the referral.

## 2019-07-30 ENCOUNTER — Encounter: Payer: Self-pay | Admitting: *Deleted

## 2019-07-30 NOTE — Progress Notes (Signed)
Dr. [name] made a referral to rad onc.  I will update them to schedule patient.  Per Dr. Julien Nordmann, med onc is not needed at this time.

## 2019-07-31 ENCOUNTER — Other Ambulatory Visit: Payer: Self-pay | Admitting: Internal Medicine

## 2019-07-31 ENCOUNTER — Encounter (HOSPITAL_COMMUNITY)
Admission: RE | Admit: 2019-07-31 | Discharge: 2019-07-31 | Disposition: A | Discharge status: Home / Self Care | Payer: Self-pay | Source: Ambulatory Visit | Attending: Internal Medicine | Admitting: Internal Medicine

## 2019-07-31 ENCOUNTER — Other Ambulatory Visit: Payer: Self-pay

## 2019-07-31 DIAGNOSIS — J849 Interstitial pulmonary disease, unspecified: Secondary | ICD-10-CM

## 2019-08-01 NOTE — Progress Notes (Signed)
Thoracic Location of Tumor / Histology: Nodule of lower lobe of left lung.  Patient presented with SOB with exertion, mild productive cough  PET 07/26/2019: 15 mm nodule in the medial left lower lobe, compatible primary bronchogenic neoplasm.  CT Chest 07/19/2019: Interval growth of left lower lobe nodular density which currently measures 1.5 x 1.3 cm (previously 1.0 cm). Further evaluation with PET-CT should be considered.  The appearance of the lungs remains compatible with usual interstitial pneumonia (UIP) per current ATS guidelines. No progression of disease compared to the prior study.  CT Chest 04/27/2019: New 1.0 cm solid pulmonary nodule in the medial basilar left lower lobe.  Spectrum of findings compatible with fibrotic interstitial lung disease with mild basilar predominance and mild honeycombing, compatible with usual interstitial pneumonia (UIP). No convincing interval progression since 11/20/2018.  Biopsies of: No biopsy done.    Tobacco/Marijuana/Snuff/ETOH use: Non smoker  Past/Anticipated interventions by pulmonology, if any: Dr. [name] 07/27/2019 -Discussed that bronch method for biopsy - can be risky given high O2 need and lower diagnosis due to distal nature. Other method of CT guided TTNA can also be risky given her advnce dlung fibrosis.  But decision to be made by experts -Empriic XRT should be a consideration with caveat that fibrosis could get worse but we might not have a choice Plan  - refer [place] navigator Norton Blizzard to discuss at conference  - also refer Rad Onc MD for evaluation of empiric XRT  Past/Anticipated interventions by cardiothoracic surgery, if any:   Past/Anticipated interventions by medical oncology, if any:  Per Dr. Julien Nordmann, med onc is not needed at this time.  Signs/Symptoms  Weight changes, if any: Intentional weight loss.  Respiratory complaints, if any: Has harder time during this time of year, unable to distinguish.  Hemoptysis, if any:  Has dry cough at baseline, productive when her allergies flair up.  No blood noted.  Pain issues, if any: No  Oxygen at baseline- wears 4-6 liters while sitting, 8-10 while walking, she can get up to 12-15 liters when the humidity is high.   SAFETY ISSUES:  Prior radiation? No  Pacemaker/ICD? No  Possible current pregnancy? Ablation  Is the patient on methotrexate? No  Current Complaints / other details:  -Port -Stent -Aneurysm coil

## 2019-08-02 ENCOUNTER — Encounter: Payer: Self-pay | Admitting: Radiation Oncology

## 2019-08-02 ENCOUNTER — Other Ambulatory Visit: Payer: Self-pay | Admitting: *Deleted

## 2019-08-02 ENCOUNTER — Other Ambulatory Visit: Payer: Self-pay

## 2019-08-02 ENCOUNTER — Encounter (HOSPITAL_COMMUNITY): Payer: Self-pay

## 2019-08-02 ENCOUNTER — Ambulatory Visit
Admission: RE | Admit: 2019-08-02 | Discharge: 2019-08-02 | Disposition: A | Discharge status: Home / Self Care | Payer: BC Managed Care – PPO | Source: Ambulatory Visit | Attending: Radiation Oncology | Admitting: Radiation Oncology

## 2019-08-02 VITALS — Ht 62.0 in | Wt 215.0 lb

## 2019-08-02 DIAGNOSIS — C3432 Malignant neoplasm of lower lobe, left bronchus or lung: Secondary | ICD-10-CM

## 2019-08-02 DIAGNOSIS — R911 Solitary pulmonary nodule: Secondary | ICD-10-CM

## 2019-08-02 NOTE — Progress Notes (Signed)
The proposed treatment discussed in cancer conference 08/02/19 is for discussion purpose only and is not a binding recommendation.  The patient was not physically examined nor present for their treatment options.  Therefore, final treatment plans cannot be decided.

## 2019-08-03 ENCOUNTER — Telehealth: Payer: Self-pay | Admitting: Adult Health

## 2019-08-03 DIAGNOSIS — J849 Interstitial pulmonary disease, unspecified: Secondary | ICD-10-CM

## 2019-08-03 NOTE — Telephone Encounter (Signed)
LFT placed .   Discussed new findings. Can hold off on GYN referral for now

## 2019-08-03 NOTE — Telephone Encounter (Signed)
Called and spoke with patient. She stated that she recently had a referral placed for [facility]. She wanted to talk with TP personally about some things that are going on. She would not elaborate.   She wishes to be called at [phone].   TP, please advise. Thanks!

## 2019-08-04 ENCOUNTER — Other Ambulatory Visit: Payer: Self-pay | Admitting: Internal Medicine

## 2019-08-06 ENCOUNTER — Encounter: Payer: Self-pay | Admitting: Licensed Clinical Social Worker

## 2019-08-06 DIAGNOSIS — C3432 Malignant neoplasm of lower lobe, left bronchus or lung: Secondary | ICD-10-CM | POA: Insufficient documentation

## 2019-08-06 MED FILL — ESBRIET 801 MG TABS: 801 | 30 days supply | Qty: 90 | Fill #0

## 2019-08-06 NOTE — Progress Notes (Signed)
[place] CSW Progress Note  Clinical Education officer, museum received return call from patient. She feels better after having met with Dr. Lisbeth Renshaw and having a plan now. She is used to dealing with health issues so, while not happy about diagnosis, feels she can cope with it. She has good support from her husband and daughter as well as her in-laws and parents who are very involved. She has reliable transportation and no other identified needs at this time. CSW explained role and support services available and invited patient to reach out if any needs arise.    Edwinna Areola Shiah Berhow , LCSW

## 2019-08-06 NOTE — Progress Notes (Signed)
[place] Psychosocial Distress Screening Clinical Social Work  Clinical Social Work was referred by distress screening protocol.  The patient scored a 10 on the Psychosocial Distress Thermometer which indicates severe distress. Patient will receive SBRT with Dr. Lisbeth Renshaw for lesion on lung. She is not connected with medical oncology at this time. Clinical Social Worker attempted to contact patient by phone to assess for distress and other psychosocial needs. No answer, left VM with direct callback number.   ONCBCN DISTRESS SCREENING 08/02/2019  Screening Type Initial Screening  Distress experienced in past week (1-10) 10  Practical problem type Work/school  Emotional problem type Nervousness/Anxiety;Adjusting to illness  Other Contact via phone       Tasha Ballard E, LCSW

## 2019-08-06 NOTE — Progress Notes (Signed)
Radiation Oncology         [phone] ________________________________  Name: Tasha Ballard MRN: [id number]  Date: 08/02/2019  DOB: 1970/07/26  WU:[id number], Tasha Lovett, MD  Tasha Maid, MD     REFERRING PHYSICIAN: [name], MD   DIAGNOSIS: The primary encounter diagnosis was Nodule of lower lobe of left lung. 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   HISTORY OF PRESENT ILLNESS::Tasha Ballard is a 49 y.o. female who is seen for an initial consultation visit regarding the patient's diagnosis of a lung tumor.  The patient was found to have a 1 cm left lower lobe tumor on a CT scan of the chest on 04/27/2019.  A subsequent CT scan was completed on 07/19/1999 to follow-up on this and the tumor had increased to 1.5 cm.  A PET scan was then performed and this lesion was found to be markedly hypermetabolic with a maximum SUV of 7.7.  No additional sign of malignant activity within the chest or elsewhere.  The patient is followed by pulmonary medicine and has significant lung disease with interstitial lung disease/respiratory failure.  Given this it is felt that a biopsy would be high risk for this patient and I have been asked to see the patient for consideration of stereotactic body radiation treatment to this lesion.    PREVIOUS RADIATION THERAPY: No   PAST MEDICAL HISTORY:  has a past medical history of Aneurysm ([place]), Back pain, Diabetes mellitus without complication ([place]), H/O blood clots, Hypertension, Joint pain, Pulmonary fibrosis (HCC), SOB (shortness of breath), Swelling, Thyroid disease, and Vitamin D deficiency.     PAST SURGICAL HISTORY: Past Surgical History:  Procedure Laterality Date  . ABLATION    . IR IMAGING GUIDED PORT INSERTION  01/26/2018  . KNEE SURGERY    . LUNG BIOPSY       FAMILY HISTORY: family history includes Heart disease in her mother; Heart failure in her mother; Rheumatologic  disease in her father.   SOCIAL HISTORY:  reports that she has never smoked. She has never used smokeless tobacco. She reports current alcohol use. She reports that she does not use drugs.   ALLERGIES: Macrobid [nitrofurantoin macrocrystal] and Adhesive [tape]   MEDICATIONS:  Current Outpatient Medications  Medication Sig Dispense Refill  . ALPRAZolam (XANAX) 0.25 MG tablet Take 1 tablet (0.25 mg total) by mouth at bedtime as needed for anxiety. 14 tablet 1  . azathioprine (IMURAN) 100 MG tablet Take 1 tablet (100 mg total) by mouth daily. 90 tablet 3  . azaTHIOprine (IMURAN) 50 MG tablet Take 1 tablet (50 mg) by mouth daily for 2 weeks then increase to 2 tablets (146m) daily. 180 tablet 0  . benzonatate (TESSALON) 200 MG capsule Take 1 capsule (200 mg total) by mouth 3 (three) times daily as needed for cough. 30 capsule 1  . carvedilol (COREG) 6.25 MG tablet Take 6.25 mg by mouth 2 (two) times daily with a meal.    . cephALEXin (KEFLEX) 500 MG capsule Take 1 capsule (500 mg total) by mouth every 6 (six) hours. 20 capsule 0  . clindamycin (CLEOCIN T) 1 % lotion     . ESBRIET 801 MG TABS TAKE 1 TABLET BY MOUTH 3 (THREE) TIMES DAILY WITH MEALS. 90 tablet 11  . famotidine (PEPCID) 40 MG tablet Take 1 tablet (40 mg total) by mouth daily. 90 tablet 0  . furosemide (LASIX) 40 MG tablet Take 40 mg  by mouth See admin instructions. Take 1 tablet (40 mg) daily, may repeat dose if needed for fluid retention.    . gabapentin (NEURONTIN) 100 MG capsule Take 1 capsule by mouth as needed.    . Magnesium Oxide 400 (240 Mg) MG TABS TAKE 1 TABLET BY MOUTH EACH DAY 30 tablet 5  . metFORMIN (GLUCOPHAGE) 500 MG tablet Take 500 mg by mouth 2 (two) times daily.     . montelukast (SINGULAIR) 10 MG tablet Take 1 tablet (10 mg total) by mouth daily after supper. 90 tablet 1  . OFEV 150 MG CAPS Take 1 capsule (150 mg total) by mouth 2 (two) times daily. 60 capsule 11  . ondansetron (ZOFRAN) 8 MG tablet TAKE 1  TABLET BY MOUTH EVERY 8 HOURS AS NEEDED FOR NAUSEA AND VOMITING 45 tablet 2  . potassium chloride (KLOR-CON) 10 MEQ tablet TAKE 1 TABLET BY MOUTH 2 TIMES A DAY 120 tablet 2  . predniSONE (DELTASONE) 10 MG tablet Take 10-46m as needed for SOB 30 tablet 3  . PROAIR HFA 108 (90 Base) MCG/ACT inhaler Inhale 2 puffs into the lungs every 6 (six) hours as needed for wheezing or shortness of breath. 18 g 5  . saccharomyces boulardii (FLORASTOR) 250 MG capsule Take 1 capsule (250 mg total) by mouth 2 (two) times daily. 60 capsule 0  . spironolactone (ALDACTONE) 100 MG tablet Take 100 mg by mouth daily.    .Tasha Kitchensulfamethoxazole-trimethoprim (BACTRIM) 400-80 MG tablet Take 1 tablet by mouth every Monday, Wednesday, and Friday.    . thyroid (ARMOUR) 90 MG tablet Take 90 mg by mouth daily before breakfast.     . Vitamin D, Ergocalciferol, (DRISDOL) 1.25 MG (50000 UT) CAPS capsule TAKE 1 CAPSULE BY MOUTH ONCE WEEKLY 3 capsule 0   No current facility-administered medications for this encounter.     REVIEW OF SYSTEMS:  A 15 point review of systems is documented in the electronic medical record. This was obtained by the nursing staff. However, I reviewed this with the patient to discuss relevant findings and make appropriate changes.  Pertinent items are noted in HPI.    PHYSICAL EXAM:  height is _0  (1.575 m) and weight is 215 lb (97.5 kg).   ECOG = 1  0 - Asymptomatic (Fully active, able to carry on all predisease activities without restriction)  1 - Symptomatic but completely ambulatory (Restricted in physically strenuous activity but ambulatory and able to carry out work of a light or sedentary nature. For example, light housework, office work)  2 - Symptomatic, <50% in bed during the day (Ambulatory and capable of all self care but unable to carry out any work activities. Up and about more than 50% of waking hours)  3 - Symptomatic, >50% in bed, but not bedbound (Capable of only limited self-care,  confined to bed or chair 50% or more of waking hours)  4 - Bedbound (Completely disabled. Cannot carry on any self-care. Totally confined to bed or chair)  5 - Death   OEustace PenMM, Creech RH, Tormey DC, et al. [phone]. "Toxicity and response criteria of the [facility]". AEppsOncol. 5 (6): 649-55     LABORATORY DATA:  Lab Results  Component Value Date   WBC 9.6 05/28/2019   HGB 12.3 05/28/2019   HCT 37.7 05/28/2019   MCV 93.1 05/28/2019   PLT 301.0 05/28/2019   Lab Results  Component Value Date   NA 135 05/28/2019   K 3.8  05/28/2019   CL 89 (L) 05/28/2019   CO2 33 (H) 05/28/2019   Lab Results  Component Value Date   ALT 18 07/03/2019   AST 14 07/03/2019   ALKPHOS 52 07/03/2019   BILITOT 0.3 07/03/2019      RADIOGRAPHY: CT Chest High Resolution  Result Date: 07/19/2019 CLINICAL DATA:  49 year old female with history of pulmonary fibrosis. EXAM: CT CHEST WITHOUT CONTRAST TECHNIQUE: Multidetector CT imaging of the chest was performed following the standard protocol without intravenous contrast. High resolution imaging of the lungs, as well as inspiratory and expiratory imaging, was performed. COMPARISON:  Chest CT 04/27/2019. FINDINGS: Cardiovascular: Heart size is normal. There is no significant pericardial fluid, thickening or pericardial calcification. Aortic atherosclerosis. No definite coronary artery calcifications. Right internal jugular single-lumen porta cath with tip terminating at the superior cavoatrial junction. Mediastinum/Nodes: No pathologically enlarged mediastinal or hilar lymph nodes. Please note that accurate exclusion of hilar adenopathy is limited on noncontrast CT scans. Esophagus is unremarkable in appearance. No axillary lymphadenopathy. Lungs/Pleura: High-resolution images again demonstrate widespread areas of ground-glass attenuation, septal thickening, subpleural reticulation and traction bronchiectasis with peripheral  bronchiolectasis. Some areas of early honeycombing are also evident, most apparent in the base of the left lower lobe. Findings have a mild craniocaudal gradient. No significant progression of disease compared to the prior examination. Inspiratory and expiratory imaging demonstrates some scattered air trapping, indicative of small airways disease. Previously noted nodular density in the medial aspect of the left lower lobe has increased in size compared to the prior examination, currently measuring 1.5 x 1.3 cm (axial image 97 of series 3). Upper Abdomen: Aortic atherosclerosis. Musculoskeletal: There are no aggressive appearing lytic or blastic lesions noted in the visualized portions of the skeleton. IMPRESSION: 1. Interval growth of left lower lobe nodular density which currently measures 1.5 x 1.3 cm (previously 1.0 cm). Further evaluation with PET-CT should be considered. 2. The appearance of the lungs remains compatible with usual interstitial pneumonia (UIP) per current ATS guidelines. No progression of disease compared to the prior study. 3. Aortic atherosclerosis. These results will be called to the ordering clinician or representative by the Radiologist Assistant, and communication documented in the PACS or [employer]. Aortic Atherosclerosis (ICD10-I70.0). Electronically Signed   By: Vinnie Langton M.D.   On: 07/19/2019 12:39   NM PET Image Initial (PI) Skull Base To Thigh  Result Date: 07/27/2019 CLINICAL DATA:  Initial treatment strategy for left lower lobe nodule. EXAM: NUCLEAR MEDICINE PET SKULL BASE TO THIGH TECHNIQUE: 11.7 mCi F-18 FDG was injected intravenously. Full-ring PET imaging was performed from the skull base to thigh after the radiotracer. CT data was obtained and used for attenuation correction and anatomic localization. Fasting blood glucose: 116 mg/dl COMPARISON:  CT chest dated 07/19/2019 FINDINGS: Mediastinal blood pool activity: SUV max 4.2 Liver activity: SUV max NA NECK:  No hypermetabolic cervical lymphadenopathy. Incidental CT findings: none CHEST: 15 mm nodule in the medial left lower lobe (series 3/image 118), max SUV 7.7, compatible primary bronchogenic neoplasm. 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 No hypermetabolic thoracic lymphadenopathy. Incidental CT findings: Right chest port terminates the cavoatrial junction. Mild atherosclerotic calcifications of the aortic arch. ABDOMEN/PELVIS: No abnormal hypermetabolism in the liver, spleen, pancreas, or adrenal glands. No hypermetabolic abdominopelvic lymphadenopathy. Incidental CT findings: GDA embolization coil. SMA stent. Mild atherosclerotic calcifications of the abdominal aorta and branch vessels. SKELETON: No focal hypermetabolic activity to suggest skeletal metastasis. Incidental CT findings:  Degenerative changes of the visualized thoracolumbar spine. IMPRESSION: 15 mm nodule in the medial left lower lobe, compatible primary bronchogenic neoplasm. No findings suspicious for metastatic disease. Electronically Signed   By: Julian Hy M.D.   On: 07/27/2019 10:06       IMPRESSION/PLAN:  The patient has a lung lesion within the left lower lung which is very inferior.  This has been growing over serial CT imaging and is markedly hypermetabolic on PET scan.  This lesion therefore likely represents a primary lung tumor, consistent with a non-small cell lung cancer stage I.  The patient has significant lung disease and a biopsy of this lesion would be very high risk so I do believe it is reasonable to consider stereotactic body radiation treatment to this site at this time.  Because of the location of the tumor, there is not as much surrounding lung adjacent to this lesion as other sites within the lung which would be I believe helpful for her situation given the importance of minimizing dose to remaining of lung as much as possible.  We discussed  the possible rationale of such a treatment as well as the possible side effects and risks as well.  Given her lung issues, I believe that irritation of the lung/scarring/pneumonitis would be the dominant wrist although we also discussed other adjacent structures as well.  All of her questions were answered and after our detailed discussion the patient indicated that she did wish to proceed with a short course of stereotactic body radiation treatment.  We would anticipate 3-5 fractions to this primary lesion.  The patient therefore will be scheduled for simulation next week for treatment planning.   Due to the coronavirus pandemic, this encounter was provided by telemedicine platform MyChart through video conference.  The patient has given verbal consent for this type of encounter and has been advised to only accept a meeting of this type in a secure network environment. The time spent during this encounter was 45 minutes, including review of medical records, discussion of the patient's case, and coordination of care on this date. The attendants for this meeting include  Dr. Lisbeth Renshaw and the patient and her husband.  During the encounter,  Dr. Lisbeth Renshaw, and was located at [facility] Radiation Oncology Department.  The patient was located at home.     ________________________________   Jodelle Gross, MD, PhD   **Disclaimer: This note was dictated with voice recognition software. Similar sounding words can inadvertently be transcribed and this note may contain transcription errors which may not have been corrected upon publication of note.**

## 2019-08-07 ENCOUNTER — Encounter (HOSPITAL_COMMUNITY)
Admission: RE | Admit: 2019-08-07 | Discharge: 2019-08-07 | Disposition: A | Discharge status: Home / Self Care | Payer: Self-pay | Source: Ambulatory Visit | Attending: Internal Medicine | Admitting: Internal Medicine

## 2019-08-07 ENCOUNTER — Other Ambulatory Visit: Payer: Self-pay

## 2019-08-08 ENCOUNTER — Ambulatory Visit
Admission: RE | Admit: 2019-08-08 | Discharge: 2019-08-08 | Disposition: A | Discharge status: Home / Self Care | Payer: BC Managed Care – PPO | Source: Ambulatory Visit | Attending: Radiation Oncology | Admitting: Radiation Oncology

## 2019-08-08 ENCOUNTER — Other Ambulatory Visit: Payer: Self-pay

## 2019-08-08 ENCOUNTER — Other Ambulatory Visit (INDEPENDENT_AMBULATORY_CARE_PROVIDER_SITE_OTHER): Payer: BC Managed Care – PPO

## 2019-08-08 DIAGNOSIS — J9690 Respiratory failure, unspecified, unspecified whether with hypoxia or hypercapnia: Secondary | ICD-10-CM | POA: Diagnosis not present

## 2019-08-08 DIAGNOSIS — C3432 Malignant neoplasm of lower lobe, left bronchus or lung: Secondary | ICD-10-CM | POA: Insufficient documentation

## 2019-08-08 DIAGNOSIS — J849 Interstitial pulmonary disease, unspecified: Secondary | ICD-10-CM

## 2019-08-08 DIAGNOSIS — Z510 Encounter for antineoplastic radiation therapy: Secondary | ICD-10-CM | POA: Insufficient documentation

## 2019-08-08 LAB — HEPATIC FUNCTION PANEL
ALT: 19 U/L (ref 0–35)
AST: 21 U/L (ref 0–37)
Albumin: 3.7 g/dL (ref 3.5–5.2)
Alkaline Phosphatase: 36 U/L — ABNORMAL LOW (ref 39–117)
Bilirubin, Direct: 0.1 mg/dL (ref 0.0–0.3)
Total Bilirubin: 0.3 mg/dL (ref 0.2–1.2)
Total Protein: 5.9 g/dL — ABNORMAL LOW (ref 6.0–8.3)

## 2019-08-09 ENCOUNTER — Encounter (HOSPITAL_COMMUNITY)
Admission: RE | Admit: 2019-08-09 | Discharge: 2019-08-09 | Disposition: A | Discharge status: Home / Self Care | Payer: Self-pay | Source: Ambulatory Visit | Attending: Internal Medicine | Admitting: Internal Medicine

## 2019-08-13 ENCOUNTER — Other Ambulatory Visit: Payer: Self-pay

## 2019-08-13 ENCOUNTER — Ambulatory Visit
Admission: RE | Admit: 2019-08-13 | Discharge: 2019-08-13 | Disposition: A | Discharge status: Home / Self Care | Payer: BC Managed Care – PPO | Source: Ambulatory Visit | Attending: Radiation Oncology | Admitting: Radiation Oncology

## 2019-08-14 ENCOUNTER — Telehealth (HOSPITAL_COMMUNITY): Payer: Self-pay | Admitting: Family Medicine

## 2019-08-14 ENCOUNTER — Encounter (HOSPITAL_COMMUNITY): Payer: Self-pay

## 2019-08-15 ENCOUNTER — Ambulatory Visit
Admission: RE | Admit: 2019-08-15 | Discharge: 2019-08-15 | Disposition: A | Discharge status: Home / Self Care | Payer: BC Managed Care – PPO | Source: Ambulatory Visit | Attending: Radiation Oncology | Admitting: Radiation Oncology

## 2019-08-15 ENCOUNTER — Other Ambulatory Visit: Payer: Self-pay

## 2019-08-15 DIAGNOSIS — C3432 Malignant neoplasm of lower lobe, left bronchus or lung: Secondary | ICD-10-CM | POA: Diagnosis not present

## 2019-08-16 ENCOUNTER — Other Ambulatory Visit: Payer: Self-pay | Admitting: Internal Medicine

## 2019-08-16 ENCOUNTER — Encounter (HOSPITAL_COMMUNITY)
Admission: RE | Admit: 2019-08-16 | Discharge: 2019-08-16 | Disposition: A | Discharge status: Home / Self Care | Payer: Self-pay | Source: Ambulatory Visit | Attending: Internal Medicine | Admitting: Internal Medicine

## 2019-08-17 ENCOUNTER — Ambulatory Visit
Admission: RE | Admit: 2019-08-17 | Discharge: 2019-08-17 | Disposition: A | Discharge status: Home / Self Care | Payer: BC Managed Care – PPO | Source: Ambulatory Visit | Attending: Radiation Oncology | Admitting: Radiation Oncology

## 2019-08-17 ENCOUNTER — Other Ambulatory Visit: Payer: Self-pay

## 2019-08-17 DIAGNOSIS — C3432 Malignant neoplasm of lower lobe, left bronchus or lung: Secondary | ICD-10-CM | POA: Diagnosis not present

## 2019-08-20 ENCOUNTER — Other Ambulatory Visit: Payer: Self-pay

## 2019-08-20 ENCOUNTER — Ambulatory Visit
Admission: RE | Admit: 2019-08-20 | Discharge: 2019-08-20 | Disposition: A | Discharge status: Home / Self Care | Payer: BC Managed Care – PPO | Source: Ambulatory Visit | Attending: Radiation Oncology | Admitting: Radiation Oncology

## 2019-08-20 DIAGNOSIS — C3432 Malignant neoplasm of lower lobe, left bronchus or lung: Secondary | ICD-10-CM | POA: Diagnosis not present

## 2019-08-21 ENCOUNTER — Encounter (HOSPITAL_COMMUNITY)
Admission: RE | Admit: 2019-08-21 | Discharge: 2019-08-21 | Disposition: A | Discharge status: Home / Self Care | Payer: Self-pay | Source: Ambulatory Visit | Attending: Internal Medicine | Admitting: Internal Medicine

## 2019-08-22 ENCOUNTER — Other Ambulatory Visit: Payer: Self-pay

## 2019-08-22 ENCOUNTER — Ambulatory Visit
Admission: RE | Admit: 2019-08-22 | Discharge: 2019-08-22 | Disposition: A | Discharge status: Home / Self Care | Payer: BC Managed Care – PPO | Source: Ambulatory Visit | Attending: Radiation Oncology | Admitting: Radiation Oncology

## 2019-08-22 DIAGNOSIS — C3432 Malignant neoplasm of lower lobe, left bronchus or lung: Secondary | ICD-10-CM | POA: Diagnosis not present

## 2019-08-23 ENCOUNTER — Ambulatory Visit (HOSPITAL_COMMUNITY)
Admission: RE | Admit: 2019-08-23 | Discharge: 2019-08-23 | Disposition: A | Discharge status: Home / Self Care | Payer: BC Managed Care – PPO | Source: Ambulatory Visit | Attending: Internal Medicine | Admitting: Internal Medicine

## 2019-08-23 ENCOUNTER — Encounter (HOSPITAL_COMMUNITY)
Admission: RE | Admit: 2019-08-23 | Discharge: 2019-08-23 | Disposition: A | Discharge status: Home / Self Care | Payer: Self-pay | Source: Ambulatory Visit | Attending: Internal Medicine | Admitting: Internal Medicine

## 2019-08-23 DIAGNOSIS — J84112 Idiopathic pulmonary fibrosis: Secondary | ICD-10-CM | POA: Insufficient documentation

## 2019-08-23 DIAGNOSIS — Z452 Encounter for adjustment and management of vascular access device: Secondary | ICD-10-CM | POA: Insufficient documentation

## 2019-08-23 MED ORDER — SODIUM CHLORIDE 0.9% FLUSH
10.0000 mL | INTRAVENOUS | Status: AC | PRN
  Administered 2019-08-23: 10 mL

## 2019-08-23 MED ORDER — HEPARIN SOD (PORK) LOCK FLUSH 100 UNIT/ML IV SOLN
500.0000 [IU] | INTRAVENOUS | Status: AC | PRN
  Administered 2019-08-23: 500 [IU]

## 2019-08-23 NOTE — Progress Notes (Signed)
PATIENT CARE CENTER NOTE   Provider: Rexene Edison NP   Procedure: port-a-cath flush   Note: Patient's PAC accessed using sterile technique. PAC flushed with 10 cc 0.9% normal saline and 500 units of heparin and then de-accessed. Patient alert, oriented and ambulatory on discharge.

## 2019-08-24 ENCOUNTER — Ambulatory Visit
Admission: RE | Admit: 2019-08-24 | Discharge: 2019-08-24 | Disposition: A | Discharge status: Home / Self Care | Payer: BC Managed Care – PPO | Source: Ambulatory Visit | Attending: Radiation Oncology | Admitting: Radiation Oncology

## 2019-08-24 ENCOUNTER — Encounter: Payer: Self-pay | Admitting: Radiation Oncology

## 2019-08-24 ENCOUNTER — Other Ambulatory Visit: Payer: Self-pay

## 2019-08-24 DIAGNOSIS — J9690 Respiratory failure, unspecified, unspecified whether with hypoxia or hypercapnia: Secondary | ICD-10-CM | POA: Diagnosis not present

## 2019-08-24 DIAGNOSIS — J849 Interstitial pulmonary disease, unspecified: Secondary | ICD-10-CM | POA: Diagnosis not present

## 2019-08-24 DIAGNOSIS — Z510 Encounter for antineoplastic radiation therapy: Secondary | ICD-10-CM | POA: Diagnosis present

## 2019-08-24 DIAGNOSIS — C3432 Malignant neoplasm of lower lobe, left bronchus or lung: Secondary | ICD-10-CM | POA: Insufficient documentation

## 2019-08-28 ENCOUNTER — Telehealth (HOSPITAL_COMMUNITY): Payer: Self-pay | Admitting: Family Medicine

## 2019-08-28 ENCOUNTER — Ambulatory Visit (INDEPENDENT_AMBULATORY_CARE_PROVIDER_SITE_OTHER): Payer: BC Managed Care – PPO | Admitting: Primary Care

## 2019-08-28 ENCOUNTER — Encounter: Payer: Self-pay | Admitting: Primary Care

## 2019-08-28 ENCOUNTER — Other Ambulatory Visit: Payer: Self-pay

## 2019-08-28 DIAGNOSIS — J029 Acute pharyngitis, unspecified: Secondary | ICD-10-CM

## 2019-08-28 DIAGNOSIS — J849 Interstitial pulmonary disease, unspecified: Secondary | ICD-10-CM

## 2019-08-28 MED ORDER — SACCHAROMYCES BOULARDII 250 MG PO CAPS: 250.0000 mg | ORAL_CAPSULE | Freq: Two times a day (BID) | ORAL | 5 refills | Status: DC

## 2019-08-28 MED ORDER — LORAZEPAM 0.5 MG PO TABS
ORAL_TABLET | ORAL | 0 refills | Status: DC
Start: 2019-08-28 — End: 2019-12-28

## 2019-08-28 MED ORDER — PREDNISONE 10 MG PO TABS: ORAL_TABLET | ORAL | 0 refills | Status: DC

## 2019-08-28 MED ORDER — AZITHROMYCIN 250 MG PO TABS: ORAL_TABLET | ORAL | 0 refills | Status: DC

## 2019-08-28 NOTE — Progress Notes (Signed)
Virtual Visit via Telephone Note  I connected with Tasha Ballard on 08/28/19 at  1:30 PM EDT by telephone and verified that I am speaking with the correct person using two identifiers.  Location: Patient: Home Provider: Office   I discussed the limitations, risks, security and privacy concerns of performing an evaluation and management service by telephone and the availability of in person appointments. I also discussed with the patient that there may be a patient responsible charge related to this service. The patient expressed understanding and agreed to proceed.   History of Present Illness: 49 year old female, never smoked.  Past medical history significant for NSIP, chronic hypoxemic respiratory failure, pulmonary nodule left lower lung PET positive.  Patient of Dr. [name], last seen on 07/27/2019. Patient is high risk for bronch method for biopsy pulmonary nodule d/t O2 need. Referred to [facility] and [facility] for evaluation for empiric XRT. Completed radiation treatments for malignant neoplasm of left lung last week on July 2nd 2021.   08/28/2019 Patient contacted today for acute virtual visit/possible ILD flare/ URI symptoms. She states that she was so upset over cancer diagnoses and this stress seems to worsens her breathing. She apparently had to re-due markings for SBRT which caused her to get sick to her stomach for 4 days straight. Reports that she completed radiation treatments for malignant neoplasm of left lung last week on July 2nd. She tried xanax a couple of times but this did not agree with her, feels it was too strong of a medication. She reports experiencing more dyspnea with walking and that her lungs feel heavy. She is getting up some mucus but reports that this could be from crying more recently. Three days ago her throat was bothering her. Oncology called in Dukes mouth wash which has improved her mouth sores some. Family has recently been sick with sinusitis symptoms, covid  negative.   She is on both [place]. She also takes 10-64m prednisone daily at baseline. States that some times when humidity is bad it will effect her breathing. She had to take 222mfor three days over the long weekend. Proair inhaler does not seem to help her breathing, states that it has never really done much for her.    Observations/Objective:  - Able to speak in full sentences; no overt shortness of breath or wheezing   #PRogressive NSIP #Chronic hypoxemic resp failure - 4L at rest, 8L with exertion in dec 2019 - dec 2020 6 to 8/12L # High risk treatment regimen  -CellCept 2013 through 2019  -Chronic prednisone 2013 -currently taking -Bactrim prophylaxis 2013 --currently taking  - Cytoxan Oral  5026mer day 12/23/17 and increaed to 100m51m 12/23/17.On11/19/19. increase to 125mg68m day   - Cytoxan IV : And switched to IV cytoxan Q4 week cycle              - 1st cycle 02/03/18 (s/p portocath 01/26/18)             = 2nd cycle 03/06/2018             - 3rd cycle  -  04/03/2018             - 4th cycle - 05/01/2018 - Cytoxan oral (due to pandemic covid-19 and need to avoid infusion visits) - spring 2020 - BAck on IV Cytoxan -              - 5th cycle - end sept 2020             -  6th cycle - end oct 2020             - 7th  Cycle - end nov 2020 (full dose) 01/17/2019 -. Stopped - Immuran             0 approx jan 2021  - Rehab + spring 2021 onwauys    - Dickey Gave 01/23/18 (approximnate) - Esbriet -early-mid April 2021  #Nodule of lower lobe of left lung - new march 2021  - biodesk negative  - enarlged to  1.5cm may 20201 - PET HOT     Assessment and Plan:   ILD: - Increased dyspnea and chest tightness x 3-5 days - Continue Esbriet 823m TID and Ofev 1568mBID - Rx Prednisone taper 406m 3 days; 23m21m3 days; 20mg40m days; 10mg 59mdays; then resume 10mg d62m  Upper respiratory infection/acute pharyngitis: - Reports productive cough and sore throat. Family  recently had sinusitis symptoms but she states they/she tested negative for covid  - Rx zpack as prescribed (no reported interaction with anti-fibrotics) - Continue mucinex 600mg tw72mdaily and Dukes mouth wash as needed   Anxiety - Anxiety is a significant contributor to shortness of breath - Discontinue Xanax because it was too strong  - RX lorazepam 0.5-1mg twic28maily as needed for anxiety symptoms  - PMP reviewed, no unexpected prescriptions found   Left lung nodule - Enarlged to 1.5cm May 20201 - PET POSITIVE - [facility] - Completed radiation treatments for malignant neoplasm of left lung last week on July 2nd 2021  Follow Up Instructions:   - Patient has followed up already scheduled with Dr. RamsawamyAlveria Apley I discussed the assessment and treatment plan with the patient. The patient was provided an opportunity to ask questions and all were answered. The patient agreed with the plan and demonstrated an understanding of the instructions.   The patient was advised to call back or seek an in-person evaluation if the symptoms worsen or if the condition fails to improve as anticipated.  I provided 30 minutes of non-face-to-face time during this encounter.   ElizabethMartyn Ehrich

## 2019-08-28 NOTE — Patient Instructions (Addendum)
Interstitial lung disease: - Continues Esbriet 812m TID and Ofev 1542mBID - Rx Prednisone taper 4025m 3 days; 50m18m3 days; 20mg104m days; 10mg 89mdays; then resume 10mg d73m  Upper respiratory infection/acute pharyngitis: - Rx zpack as prescribed (no reported interaction with anti-fibrotics) - Continue mucinex 600mg tw3mdaily and Dukes mouth wash as needed   Anxiety: - Discontinue Xanax  - RX lorazepam 0.5-1mg twic54maily as needed for anxiety symptoms   Follow-up: - 7/20 with Dr. RamaswamyChase Caller

## 2019-08-30 ENCOUNTER — Encounter (HOSPITAL_COMMUNITY): Payer: Self-pay

## 2019-09-04 ENCOUNTER — Encounter: Payer: Self-pay | Admitting: *Deleted

## 2019-09-04 ENCOUNTER — Encounter (HOSPITAL_COMMUNITY): Payer: Self-pay

## 2019-09-04 MED FILL — ESBRIET 801 MG TABS: 801 | 30 days supply | Qty: 90 | Fill #1

## 2019-09-05 ENCOUNTER — Telehealth: Payer: Self-pay | Admitting: Internal Medicine

## 2019-09-05 NOTE — Telephone Encounter (Signed)
Patient has an appointment with you on 09/11/19 and wants to know if she needs to have any labs done before appointment. Patient finished Radiation 2 weeks ago.  Dr. [name] please advise

## 2019-09-06 ENCOUNTER — Encounter (HOSPITAL_COMMUNITY)
Admission: RE | Admit: 2019-09-06 | Discharge: 2019-09-06 | Disposition: A | Discharge status: Home / Self Care | Payer: Self-pay | Source: Ambulatory Visit | Attending: Internal Medicine | Admitting: Internal Medicine

## 2019-09-06 ENCOUNTER — Other Ambulatory Visit: Payer: Self-pay

## 2019-09-06 NOTE — Telephone Encounter (Signed)
Called pt and advised message from the provider. Pt understood and verbalized understanding. Nothing further is needed. She will get labs done when she is here. Nothing further is needed.

## 2019-09-06 NOTE — Telephone Encounter (Signed)
We can do cbc, bmet, lft, mag and phos but should be day of or few days before visit

## 2019-09-11 ENCOUNTER — Other Ambulatory Visit (INDEPENDENT_AMBULATORY_CARE_PROVIDER_SITE_OTHER): Payer: BC Managed Care – PPO

## 2019-09-11 ENCOUNTER — Encounter: Payer: Self-pay | Admitting: Internal Medicine

## 2019-09-11 ENCOUNTER — Ambulatory Visit (INDEPENDENT_AMBULATORY_CARE_PROVIDER_SITE_OTHER): Payer: BC Managed Care – PPO | Admitting: Internal Medicine

## 2019-09-11 ENCOUNTER — Other Ambulatory Visit: Payer: Self-pay

## 2019-09-11 ENCOUNTER — Encounter (HOSPITAL_COMMUNITY): Payer: Self-pay

## 2019-09-11 VITALS — BP 110/62 | HR 109 | Temp 98.0°F | Ht 62.0 in | Wt 207.6 lb

## 2019-09-11 DIAGNOSIS — J849 Interstitial pulmonary disease, unspecified: Secondary | ICD-10-CM | POA: Diagnosis not present

## 2019-09-11 DIAGNOSIS — Z5181 Encounter for therapeutic drug level monitoring: Secondary | ICD-10-CM

## 2019-09-11 DIAGNOSIS — R911 Solitary pulmonary nodule: Secondary | ICD-10-CM

## 2019-09-11 DIAGNOSIS — J8489 Other specified interstitial pulmonary diseases: Secondary | ICD-10-CM | POA: Diagnosis not present

## 2019-09-11 DIAGNOSIS — Z95828 Presence of other vascular implants and grafts: Secondary | ICD-10-CM

## 2019-09-11 DIAGNOSIS — J9611 Chronic respiratory failure with hypoxia: Secondary | ICD-10-CM

## 2019-09-11 LAB — CBC
HCT: 42.7 % (ref 36.0–46.0)
Hemoglobin: 14.3 g/dL (ref 12.0–15.0)
MCHC: 33.5 g/dL (ref 30.0–36.0)
MCV: 97.2 fl (ref 78.0–100.0)
Platelets: 208 10*3/uL (ref 150.0–400.0)
RBC: 4.4 Mil/uL (ref 3.87–5.11)
RDW: 16 % — ABNORMAL HIGH (ref 11.5–15.5)
WBC: 9.6 10*3/uL (ref 4.0–10.5)

## 2019-09-11 LAB — BASIC METABOLIC PANEL
BUN: 15 mg/dL (ref 6–23)
CO2: 35 mEq/L — ABNORMAL HIGH (ref 19–32)
Calcium: 10.3 mg/dL (ref 8.4–10.5)
Chloride: 88 mEq/L — ABNORMAL LOW (ref 96–112)
Creatinine, Ser: 0.78 mg/dL (ref 0.40–1.20)
GFR: 77.85 mL/min (ref >60.00)
Glucose, Bld: 161 mg/dL — ABNORMAL HIGH (ref 70–99)
Potassium: 3.3 mEq/L — ABNORMAL LOW (ref 3.5–5.1)
Sodium: 135 mEq/L (ref 135–145)

## 2019-09-11 LAB — ALT: ALT: 17 U/L (ref 0–35)

## 2019-09-11 MED ORDER — SACCHAROMYCES BOULARDII 250 MG PO CAPS: 250.0000 mg | ORAL_CAPSULE | Freq: Two times a day (BID) | ORAL | 5 refills | Status: DC

## 2019-09-11 NOTE — Progress Notes (Addendum)
OV 07/27/2019 -   Subjective:  Patient ID: Tasha Ballard, female , DOB: 03/13/1970 , age 49 y.o. , MRN: [id number] , ADDRESS: [address] [place] [address]   07/27/2019 - telephone visit. Patient identifed with 2PHI   HPI Tasha Ballard 49 y.o. - to discuss below results. rEsults given. She is very anxious and sad about the results     NM PET Image Initial (PI) Skull Base To Thigh  Result Date: 07/27/2019 CLINICAL DATA:  Initial treatment strategy for left lower lobe nodule. EXAM: NUCLEAR MEDICINE PET SKULL BASE TO THIGH TECHNIQUE: 11.7 mCi F-18 FDG was injected intravenously. Full-ring PET imaging was performed from the skull base to thigh after the radiotracer. CT data was obtained and used for attenuation correction and anatomic localization. Fasting blood glucose: 116 mg/dl COMPARISON:  CT chest dated 07/19/2019 FINDINGS: Mediastinal blood pool activity: SUV max 4.2 Liver activity: SUV max NA NECK: No hypermetabolic cervical lymphadenopathy. Incidental CT findings: none CHEST: 15 mm nodule in the medial left lower lobe (series 3/image 118), max SUV 7.7, compatible primary bronchogenic neoplasm. 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 No hypermetabolic thoracic lymphadenopathy. Incidental CT findings: Right chest port terminates the cavoatrial junction. Mild atherosclerotic calcifications of the aortic arch. ABDOMEN/PELVIS: No abnormal hypermetabolism in the liver, spleen, pancreas, or adrenal glands. No hypermetabolic abdominopelvic lymphadenopathy. Incidental CT findings: GDA embolization coil. SMA stent. Mild atherosclerotic calcifications of the abdominal aorta and branch vessels. SKELETON: No focal hypermetabolic activity to suggest skeletal metastasis. Incidental CT findings: Degenerative changes of the visualized thoracolumbar spine. IMPRESSION: 15 mm nodule in the medial left lower lobe, compatible  primary bronchogenic neoplasm. No findings suspicious for metastatic disease. Electronically Signed   By: Julian Hy M.D.   On: 07/27/2019 10:06    OV 09/11/2019   Subjective:  Patient ID: Tasha Ballard, female , DOB: 11-28-1970, age 15 y.o. years. , MRN: [id number],  ADDRESS: [address] [place] [address] PCP  [name], MD Providers : Treatment Team:  Attending Provider: Brand Males, MD   Chief Complaint  Patient presents with  . Follow-up    medication question    #Morbid Obesity - needs weight loss for transplant - sees Dr Janeal Holmes since dec 2019  - Lung transplant needed: Obesity Barrier :  Weight 231#  11/22/17  -> 221# 01/17/2018., -> 222# on 03/02/2018 -> 220# on 03/30/2018 -> 223# on 04/26/2018 -> 224# on 10/20/2018 -> 219# on 01/03/2019 -> 222#    #PRogressive NSIP #Chronic hypoxemic resp failure - 4L at rest, 8L with exertion in dec 2019 - dec 2020 6 to 8/12L # High risk treatment regimen  -CellCept 2013 through 2019  -Chronic prednisone 2013 -currently taking -Bactrim prophylaxis 2013 --currently taking  - Cytoxan Oral  21m per day 12/23/17 and increaed to 1067mon 12/23/17.On11/19/19. increase to 12571mer day   - Cytoxan IV : And switched to IV cytoxan Q4 week cycle   - 1st cycle 02/03/18 (s/p portocath 01/26/18)  = 2nd cycle 03/06/2018  - 3rd cycle  -  04/03/2018  - 4th cycle - 05/01/2018 - Cytoxan oral (due to pandemic covid-19 and need to avoid infusion visits) - spring 2020 - BAck on IV Cytoxan -   - 5th cycle - end sept 2020  - 6th cycle - end oct 2020  - 7th  Cycle - end nov 2020 (full dose) 01/17/2019 -. Stopped -  Immuran  0 approx jan 2021  - Rehab + spring 2021 onwauys    - Dickey Gave 01/23/18 (approximnate) - Esbriet -early-mid April 2021  #Nodule of lower lobe of left lung - new march 2021  - biodesk negative  - enarlged to  1.5cm may 20201 - PET HOT - empiric XRT    HPI Tasha Ballard 49 y.o. -returns for follow-up  with her husband [name].  She is now on Imuran twice daily.  She is also taking nintedanib and full dose Esbriet for her ILD.  She feels stable.  At rest she is between 4 and 6 L.  When she works out she uses 8-12 L.  This is stable.  She has lost 15 pounds of weight.  For lung nodule she is finished radiation ending early July 2021.  She did have some side effects in the throat because of this.  She also has altered taste which she believes could be because of the Esbriet and Metformin.  Nevertheless overall she is tolerating the drugs fine.  She is happy about her weight loss.  She and her husband wanted know if hyperbaric oxygen will help her.  I have reached out to some colleagues through the pulmonary fibrosis foundation to figure this out.  She is agreeable to removing her central line Port-A-Cath which is currently not being used.  She also indicated to me that when she took probiotic along with antibiotics recently this helped reset her diarrhea.  She wants to know if she should take probiotics on a regular basis.  Discussed with Dr. Collene Mares about her personal professional opinion about this.  Echo March 2021: Normal RV function.  Grade 1 diastolic dysfunction.  PA pressures not calculated.   ROS - per HPI     has a past medical history of Aneurysm ([place]), Back pain, Diabetes mellitus without complication ([place]), H/O blood clots, Hypertension, Joint pain, Pulmonary fibrosis (HCC), SOB (shortness of breath), Swelling, Thyroid disease, and Vitamin D deficiency.   reports that she has never smoked. She has never used smokeless tobacco.  Past Surgical History:  Procedure Laterality Date  . ABLATION    . IR IMAGING GUIDED PORT INSERTION  01/26/2018  . KNEE SURGERY    . LUNG BIOPSY      Allergies  Allergen Reactions  . Macrobid [Nitrofurantoin Macrocrystal] Nausea And Vomiting  . Adhesive [Tape] Rash    Immunization History  Administered Date(s) Administered  . Influenza,inj,Quad PF,6+ Mos  12/24/2014, 02/05/2016, 01/07/2017, 11/22/2017, 10/20/2018  . Influenza-Unspecified 12/24/2014, 02/05/2016, 01/07/2017, 11/22/2017  . PFIZER SARS-COV-2 Vaccination 04/06/2019, 04/30/2019  . Pneumococcal Conjugate-13 07/20/2017  . Pneumococcal Polysaccharide-23 06/09/2012, 01/03/2019  . Tdap 02/19/2011    Family History  Problem Relation Age of Onset  . Heart failure Mother   . Heart disease Mother   . Rheumatologic disease Father      Current Outpatient Medications:  .  azathioprine (IMURAN) 100 MG tablet, Take 1 tablet (100 mg total) by mouth daily., Disp: 90 tablet, Rfl: 3 .  benzonatate (TESSALON) 200 MG capsule, Take 1 capsule (200 mg total) by mouth 3 (three) times daily as needed for cough., Disp: 30 capsule, Rfl: 1 .  carvedilol (COREG) 6.25 MG tablet, Take 6.25 mg by mouth 2 (two) times daily with a meal., Disp: , Rfl:  .  ESBRIET 801 MG TABS, TAKE 1 TABLET BY MOUTH 3 (THREE) TIMES DAILY WITH MEALS., Disp: 90 tablet, Rfl: 11 .  famotidine (PEPCID) 40 MG tablet, TAKE 1 TABLET BY  MOUTH EVERY DAY, Disp: 90 tablet, Rfl: 3 .  furosemide (LASIX) 40 MG tablet, Take 40 mg by mouth See admin instructions. Take 1 tablet (40 mg) daily, may repeat dose if needed for fluid retention., Disp: , Rfl:  .  gabapentin (NEURONTIN) 100 MG capsule, Take 1 capsule by mouth as needed., Disp: , Rfl:  .  LORazepam (ATIVAN) 0.5 MG tablet, 1/2 - 1 tablet twice daily as needed for anxiety, Disp: 30 tablet, Rfl: 0 .  Magnesium Oxide 400 (240 Mg) MG TABS, TAKE 1 TABLET BY MOUTH EACH DAY, Disp: 30 tablet, Rfl: 5 .  metFORMIN (GLUCOPHAGE) 500 MG tablet, Take 500 mg by mouth 2 (two) times daily. , Disp: , Rfl:  .  montelukast (SINGULAIR) 10 MG tablet, Take 1 tablet (10 mg total) by mouth daily after supper., Disp: 90 tablet, Rfl: 1 .  OFEV 150 MG CAPS, Take 1 capsule (150 mg total) by mouth 2 (two) times daily., Disp: 60 capsule, Rfl: 11 .  ondansetron (ZOFRAN) 8 MG tablet, TAKE 1 TABLET BY MOUTH EVERY 8 HOURS AS  NEEDED FOR NAUSEA AND VOMITING, Disp: 45 tablet, Rfl: 2 .  potassium chloride (KLOR-CON) 10 MEQ tablet, TAKE 1 TABLET BY MOUTH 2 TIMES A DAY, Disp: 120 tablet, Rfl: 2 .  predniSONE (DELTASONE) 10 MG tablet, Take 10-42m as needed for SOB, Disp: 30 tablet, Rfl: 3 .  predniSONE (DELTASONE) 10 MG tablet, Take 4 tabs po daily x 3 days; then 3 tabs daily x3 days; then 2 tabs daily x3 days; then 1 tab daily x 3 days; then stop, Disp: 30 tablet, Rfl: 0 .  PROAIR HFA 108 (90 Base) MCG/ACT inhaler, Inhale 2 puffs into the lungs every 6 (six) hours as needed for wheezing or shortness of breath., Disp: 18 g, Rfl: 5 .  saccharomyces boulardii (FLORASTOR) 250 MG capsule, Take 1 capsule (250 mg total) by mouth 2 (two) times daily., Disp: 60 capsule, Rfl: 5 .  spironolactone (ALDACTONE) 100 MG tablet, Take 100 mg by mouth daily., Disp: , Rfl:  .  sulfamethoxazole-trimethoprim (BACTRIM) 400-80 MG tablet, Take 1 tablet by mouth every Monday, Wednesday, and Friday., Disp: , Rfl:  .  thyroid (ARMOUR) 90 MG tablet, Take 90 mg by mouth daily before breakfast. , Disp: , Rfl:  .  Vitamin D, Ergocalciferol, (DRISDOL) 1.25 MG (50000 UT) CAPS capsule, TAKE 1 CAPSULE BY MOUTH ONCE WEEKLY, Disp: 3 capsule, Rfl: 0      Objective:   Vitals:   09/11/19 1358  BP: 110/62  Pulse: (!) 109  Temp: 98 F (36.7 C)  TempSrc: Oral  SpO2: 99%  Weight: 207 lb 9.6 oz (94.2 kg)  Height: _0  (1.575 m)    Estimated body mass index is 37.97 kg/m as calculated from the following:   Height as of this encounter: _1  (1.575 m).   Weight as of this encounter: 207 lb 9.6 oz (94.2 kg).  _2 @  Filed Weights   09/11/19 1358  Weight: 207 lb 9.6 oz (94.2 kg)     Physical Exam Obese lady sitting comfortably.  She has lost weight.  Bilateral bibasal crackles oxygen on.  No stigmata of connective tissue disease.  No evidence of cor pulmonale.  Abdomen soft.      Assessment:       ICD-10-CM   1. ILD (interstitial lung  disease) ([place]  J84.9   2. NSIP (nonspecific interstitial pneumonia) ([facility]  J84.89   3. Chronic respiratory failure with hypoxia (HCC)  J96.11  4. Therapeutic drug monitoring  Z51.81   5. Nodule of lower lobe of left lung  R91.1   6. Presence of permanent central venous catheter  Z95.828    At some point in the future we can figure out about right heart catheterization after gaining some real well experience about inhaled Tyvaso and depending on the course of her illness.  Echo spring 2021 did not show clear-cut evidence of pulmonary artery pressure elevation.    Plan:     Patient Instructions     ICD-10-CM   1. ILD (interstitial lung disease) ([place])  J84.9   2. NSIP (nonspecific interstitial pneumonia) ([place])  J84.89   3. Chronic respiratory failure with hypoxia (HCC)  J96.11   4. Therapeutic drug monitoring  Z51.81   5. Nodule of lower lobe of left lung  R91.1   6. Presence of permanent central venous catheter  Z95.828    ILD (interstitial lung disease) (HCC) NSIP (nonspecific interstitial pneumonia) (HCC) Chronic respiratory failure with hypoxia (HCC) Therapeutic drug monitoring  -Interstitial lung disease appears stable  Plan -Continue Esbriet full dose, nintedanib full dose and Imuran -Check CBC, chemistry and liver function test today -Okay to take ultra flora balance probiotic [discussed with Dr. Collene Mares 1/day-cheapest on [email or web address],.  She also indicated to avoid artificial sweeteners -Awaiting to hear more on hyperbaric oxygen -Continue weight loss Jacqulyn Liner work losing 15 pounds] - spiro[facility]dlco [facility]  Nodule of lower lobe of left lung -status post radiation ending July 2021  Plan -CT scan of the chest follow-up per radiation oncology  Presence of permanent central venous catheter  -Currently no indication to keep it  Plan -Arrange for removal  Follow-up -6-8 weeks with Dr. [name] 30-minute visit Eyvonne Mechanic schedule still not made]       SIGNATURE     Dr. Brand Males, M.D., F.C.C.P,  Pulmonary and Critical Care Medicine Staff Physician, [place] Director - Interstitial Lung Disease  Program  Pulmonary [place] [facility] [place], [place], [address]  Pager: [phone], If no answer or between  15:00h - 7:00h: call [phone] Telephone: [phone]  2:58 PM 09/11/2019

## 2019-09-11 NOTE — Addendum Note (Signed)
Addended by: Gavin Potters R on: 09/11/2019 03:06 PM   Modules accepted: Orders

## 2019-09-11 NOTE — Patient Instructions (Addendum)
ICD-10-CM   1. ILD (interstitial lung disease) ([place])  J84.9   2. NSIP (nonspecific interstitial pneumonia) ([place])  J84.89   3. Chronic respiratory failure with hypoxia (HCC)  J96.11   4. Therapeutic drug monitoring  Z51.81   5. Nodule of lower lobe of left lung  R91.1   6. Presence of permanent central venous catheter  Z95.828    ILD (interstitial lung disease) (HCC) NSIP (nonspecific interstitial pneumonia) (HCC) Chronic respiratory failure with hypoxia (HCC) Therapeutic drug monitoring  -Interstitial lung disease appears stable  Plan -Continue Esbriet full dose, nintedanib full dose and Imuran -Check CBC, chemistry and liver function test today -Okay to take ultra flora balance probiotic [discussed with Dr. Collene Mares 1/day-cheapest on [email or web address],.  She also indicated to avoid artificial sweeteners -Awaiting to hear more on hyperbaric oxygen -Continue weight loss Tasha Ballard work losing 15 pounds] - spiro/dlco at [facility]  Nodule of lower lobe of left lung -status post radiation ending July 2021  Plan -CT scan of the chest follow-up per radiation oncology  Presence of permanent central venous catheter  -Currently no indication to keep it  Plan -Arrange for removal  Follow-up -6-8 weeks with Tasha Ballard 30-minute visit Tasha Ballard schedule still not made]

## 2019-09-12 ENCOUNTER — Telehealth: Payer: Self-pay | Admitting: Internal Medicine

## 2019-09-12 NOTE — Telephone Encounter (Signed)
Follow-up from the visit yesterday  #1 -Patient labs from yesterday shows potassium being low at 3.3 mEq.  This is because of Lasix that dropped the potassium.  However Aldactone, which she is also taking increases the potassium.  Nevertheless overall the potassium is low.  #2 inquired about hyperbaric oxygen chamber with the doctor in [place] who specializes in pulmonary fibrosis.  She states 2 of her patients have inquired about it.  She thinks this is because of an advertisement perhaps.  However she does not have any knowledge that this can be helpful  PLAN  -Take 20 mEq of potassium daily for 1 week -Informed that hyperbaric oxygen treatment for pulmonary fibrosis has no evidence of benefit   Current Outpatient Medications:  .  azathioprine (IMURAN) 100 MG tablet, Take 1 tablet (100 mg total) by mouth daily., Disp: 90 tablet, Rfl: 3 .  benzonatate (TESSALON) 200 MG capsule, Take 1 capsule (200 mg total) by mouth 3 (three) times daily as needed for cough., Disp: 30 capsule, Rfl: 1 .  carvedilol (COREG) 6.25 MG tablet, Take 6.25 mg by mouth 2 (two) times daily with a meal., Disp: , Rfl:  .  ESBRIET 801 MG TABS, TAKE 1 TABLET BY MOUTH 3 (THREE) TIMES DAILY WITH MEALS., Disp: 90 tablet, Rfl: 11 .  famotidine (PEPCID) 40 MG tablet, TAKE 1 TABLET BY MOUTH EVERY DAY, Disp: 90 tablet, Rfl: 3 .  furosemide (LASIX) 40 MG tablet, Take 40 mg by mouth See admin instructions. Take 1 tablet (40 mg) daily, may repeat dose if needed for fluid retention., Disp: , Rfl:  .  gabapentin (NEURONTIN) 100 MG capsule, Take 1 capsule by mouth as needed., Disp: , Rfl:  .  LORazepam (ATIVAN) 0.5 MG tablet, 1/2 - 1 tablet twice daily as needed for anxiety, Disp: 30 tablet, Rfl: 0 .  Magnesium Oxide 400 (240 Mg) MG TABS, TAKE 1 TABLET BY MOUTH EACH DAY, Disp: 30 tablet, Rfl: 5 .  metFORMIN (GLUCOPHAGE) 500 MG tablet, Take 500 mg by mouth 2 (two) times daily. , Disp: , Rfl:  .  montelukast (SINGULAIR) 10 MG tablet, Take  1 tablet (10 mg total) by mouth daily after supper., Disp: 90 tablet, Rfl: 1 .  OFEV 150 MG CAPS, Take 1 capsule (150 mg total) by mouth 2 (two) times daily., Disp: 60 capsule, Rfl: 11 .  ondansetron (ZOFRAN) 8 MG tablet, TAKE 1 TABLET BY MOUTH EVERY 8 HOURS AS NEEDED FOR NAUSEA AND VOMITING, Disp: 45 tablet, Rfl: 2 .  potassium chloride (KLOR-CON) 10 MEQ tablet, TAKE 1 TABLET BY MOUTH 2 TIMES A DAY, Disp: 120 tablet, Rfl: 2 .  predniSONE (DELTASONE) 10 MG tablet, Take 10-19m as needed for SOB, Disp: 30 tablet, Rfl: 3 .  predniSONE (DELTASONE) 10 MG tablet, Take 4 tabs po daily x 3 days; then 3 tabs daily x3 days; then 2 tabs daily x3 days; then 1 tab daily x 3 days; then stop, Disp: 30 tablet, Rfl: 0 .  PROAIR HFA 108 (90 Base) MCG/ACT inhaler, Inhale 2 puffs into the lungs every 6 (six) hours as needed for wheezing or shortness of breath., Disp: 18 g, Rfl: 5 .  saccharomyces boulardii (FLORASTOR) 250 MG capsule, Take 1 capsule (250 mg total) by mouth 2 (two) times daily., Disp: 60 capsule, Rfl: 5 .  spironolactone (ALDACTONE) 100 MG tablet, Take 100 mg by mouth daily., Disp: , Rfl:  .  sulfamethoxazole-trimethoprim (BACTRIM) 400-80 MG tablet, Take 1 tablet by mouth every Monday, Wednesday, and  Friday., Disp: , Rfl:  .  thyroid (ARMOUR) 90 MG tablet, Take 90 mg by mouth daily before breakfast. , Disp: , Rfl:  .  Vitamin D, Ergocalciferol, (DRISDOL) 1.25 MG (50000 UT) CAPS capsule, TAKE 1 CAPSULE BY MOUTH ONCE WEEKLY, Disp: 3 capsule, Rfl: 0    LABS    PULMONARY No results for input(s): PHART, PCO2ART, PO2ART, HCO3, TCO2, O2SAT in the last 168 hours.  Invalid input(s): PCO2, PO2  CBC Recent Labs  Lab 09/11/19 1510  HGB 14.3  HCT 42.7  WBC 9.6  PLT 208.0    COAGULATION No results for input(s): INR in the last 168 hours.  CARDIAC  No results for input(s): TROPONINI in the last 168 hours. No results for input(s): PROBNP in the last 168 hours.   CHEMISTRY Recent Labs  Lab  09/11/19 1510  NA 135  K 3.3*  CL 88*  CO2 35*  GLUCOSE 161*  BUN 15  CREATININE 0.78  CALCIUM 10.3   Estimated Creatinine Clearance: 88.9 mL/min (by C-G formula based on SCr of 0.78 mg/dL).   LIVER Recent Labs  Lab 09/11/19 1510  ALT 17     INFECTIOUS No results for input(s): LATICACIDVEN, PROCALCITON in the last 168 hours.   ENDOCRINE CBG (last 3)  No results for input(s): GLUCAP in the last 72 hours.       IMAGING x48h  - image(s) personally visualized  -   highlighted in bold No results found.

## 2019-09-12 NOTE — Telephone Encounter (Signed)
Spoke with pt today via phone and pt expressed hesitation about removing port-a-cath at this time. Expressed pt's concerns to [facility] who stated we will hold off on removal of port-a-cath at this time and will re-evaluate at next visit. Spoke with pt and pt stated understanding. Nothing further needed.

## 2019-09-13 ENCOUNTER — Other Ambulatory Visit: Payer: Self-pay

## 2019-09-13 ENCOUNTER — Encounter (HOSPITAL_COMMUNITY)
Admission: RE | Admit: 2019-09-13 | Discharge: 2019-09-13 | Disposition: A | Discharge status: Home / Self Care | Payer: Self-pay | Source: Ambulatory Visit | Attending: Internal Medicine | Admitting: Internal Medicine

## 2019-09-18 ENCOUNTER — Encounter (HOSPITAL_COMMUNITY)
Admission: RE | Admit: 2019-09-18 | Discharge: 2019-09-18 | Disposition: A | Discharge status: Home / Self Care | Payer: Self-pay | Source: Ambulatory Visit | Attending: Internal Medicine | Admitting: Internal Medicine

## 2019-09-18 ENCOUNTER — Other Ambulatory Visit: Payer: Self-pay

## 2019-09-18 ENCOUNTER — Telehealth: Payer: Self-pay | Admitting: Internal Medicine

## 2019-09-18 NOTE — Telephone Encounter (Signed)
Attempted to call pt but unable to reach. Left message for pt to return call. 

## 2019-09-18 NOTE — Telephone Encounter (Signed)
Pt is returning Emily's call- please call at [phone] -

## 2019-09-18 NOTE — Telephone Encounter (Signed)
Pt has had Rx for potassium 16mq sent to the pharmacy in the past for her to take one bid. Attempted to call pt to see if she still had this rx or if she was needing an Rx of the 241m to be sent to the pharmacy for her. Left message for her to return call.

## 2019-09-19 MED ORDER — POTASSIUM CHLORIDE ER 10 MEQ PO TBCR: 30.0000 meq | EXTENDED_RELEASE_TABLET | Freq: Every day | ORAL | 2 refills | Status: DC

## 2019-09-19 NOTE — Telephone Encounter (Signed)
Spoke with the pt and notified of response per MR  Rx sent   She is asking about florastor- this is too expensive She wants to know if she can take an OTC brand probiotic instead\ Please advise, thanks!

## 2019-09-19 NOTE — Telephone Encounter (Signed)
Spoke with the pt  She states that she has been taking Potassium 10 meq bid all along  So already taking total of 20 meq daily  Please advise thanks

## 2019-09-19 NOTE — Telephone Encounter (Signed)
See phone note dated 09/12/19

## 2019-09-19 NOTE — Telephone Encounter (Signed)
She can increase potassium to total 97mq per day

## 2019-09-20 ENCOUNTER — Telehealth: Payer: Self-pay | Admitting: Radiation Oncology

## 2019-09-20 ENCOUNTER — Encounter (HOSPITAL_COMMUNITY)
Admission: RE | Admit: 2019-09-20 | Discharge: 2019-09-20 | Disposition: A | Discharge status: Home / Self Care | Payer: Self-pay | Source: Ambulatory Visit | Attending: Internal Medicine | Admitting: Internal Medicine

## 2019-09-20 ENCOUNTER — Telehealth: Payer: Self-pay | Admitting: *Deleted

## 2019-09-20 ENCOUNTER — Other Ambulatory Visit: Payer: Self-pay

## 2019-09-20 DIAGNOSIS — R911 Solitary pulmonary nodule: Secondary | ICD-10-CM

## 2019-09-20 DIAGNOSIS — C3432 Malignant neoplasm of lower lobe, left bronchus or lung: Secondary | ICD-10-CM

## 2019-09-20 NOTE — Telephone Encounter (Signed)
  Radiation Oncology         ([phone]) [phone] ________________________________  Name: STEVEN VEAZIE MRN: [id number]  Date of Service: 09/20/2019  DOB: [birth year]  Post Treatment Telephone Note  Diagnosis:   Putative Stage IA2, cT1bN0M0, NSCLC of the LLL.  Interval Since Last Radiation:  4 weeks   08/15/19-08/24/19 SBRT Treatment: The LLL target was treated to 60 Gy in 5 fractions.  Narrative:  The patient was contacted today for routine follow-up. During treatment she did very well with radiotherapy and did not have significant desquamation. She reports she is doing well. She has some persistent fatigue though feels this is improving. She continues to work with pulmonary rehabilitation as well.  Impression/Plan: 1. Putative Stage IA2, cT1bN0M0, NSCLC of the LLL.. The patient has been doing well since completion of radiotherapy. We discussed that we would plan to proceed with repeat imaging in a few weeks and follow up with these results by phone. She will also continue to follow up with Dr. [name] in pulmonary medicine and was counseled on the risks of radiation pneumonitis and precautions to call.    Carola Rhine, PAC

## 2019-09-20 NOTE — Telephone Encounter (Signed)
CALLED PATIENT TO INFORM OF CT FOR 10-01-19 - ARRIVAL TIME- 3:15 PM @ [facility],PATIENT TO HAVE WATER ONLY - 4 HRS. PRIOR TO TEST,  PATIENT TO RECEIVE CT RESULTS FROM [facility] ON 10-03-19@ 8:30 AM VIA TELEPHONE, LVM FOR A RETURN CALL

## 2019-09-20 NOTE — Telephone Encounter (Signed)
ytes the florastar is expensive. I twas recommended by Dr Collene Mares but she is welcome to try OTC and see how it goes

## 2019-09-20 NOTE — Telephone Encounter (Signed)
Spoke with the pt and notified of recs per MR and she verbalized understanding.

## 2019-09-25 ENCOUNTER — Ambulatory Visit (HOSPITAL_COMMUNITY)
Admission: RE | Admit: 2019-09-25 | Discharge: 2019-09-25 | Disposition: A | Discharge status: Home / Self Care | Payer: BC Managed Care – PPO | Source: Ambulatory Visit | Attending: Internal Medicine | Admitting: Internal Medicine

## 2019-09-25 ENCOUNTER — Encounter (HOSPITAL_COMMUNITY)
Admission: RE | Admit: 2019-09-25 | Discharge: 2019-09-25 | Disposition: A | Discharge status: Home / Self Care | Payer: Self-pay | Source: Ambulatory Visit | Attending: Internal Medicine | Admitting: Internal Medicine

## 2019-09-25 ENCOUNTER — Other Ambulatory Visit: Payer: Self-pay

## 2019-09-25 DIAGNOSIS — Z452 Encounter for adjustment and management of vascular access device: Secondary | ICD-10-CM | POA: Insufficient documentation

## 2019-09-25 DIAGNOSIS — J84112 Idiopathic pulmonary fibrosis: Secondary | ICD-10-CM | POA: Insufficient documentation

## 2019-09-25 MED ORDER — SODIUM CHLORIDE 0.9% FLUSH
10.0000 mL | INTRAVENOUS | Status: AC | PRN
  Administered 2019-09-25: 10 mL

## 2019-09-25 MED ORDER — HEPARIN SOD (PORK) LOCK FLUSH 100 UNIT/ML IV SOLN
500.0000 [IU] | INTRAVENOUS | Status: AC | PRN
  Administered 2019-09-25: 500 [IU]
  Filled 2019-09-25: qty 5

## 2019-09-25 NOTE — Progress Notes (Signed)
PATIENT CARE CENTER NOTE  Provider: Rexene Edison, NP   Procedure: Port-a- cath flush   Note: Patient's implanted port accessed and de-accessed using sterile technique. Flushed with heparin 500 units and 10 cc, 0.9 % sodium chloride. Patient declined printed AVS. Patient alert, oriented and ambulatory at discharge.

## 2019-09-27 ENCOUNTER — Encounter (HOSPITAL_COMMUNITY)
Admission: RE | Admit: 2019-09-27 | Discharge: 2019-09-27 | Disposition: A | Discharge status: Home / Self Care | Payer: Self-pay | Source: Ambulatory Visit | Attending: Internal Medicine | Admitting: Internal Medicine

## 2019-09-27 ENCOUNTER — Other Ambulatory Visit: Payer: Self-pay

## 2019-09-29 NOTE — Progress Notes (Signed)
  Radiation Oncology         [phone] ________________________________  Name: Tasha Ballard MRN: [id number]  Date: 08/24/2019  DOB: Oct 19, 1970  End of Treatment Note  Diagnosis:   Stage I lung cancer    Indication for treatment::  curative       Radiation treatment dates:   08/15/19 - 08/24/19  Site/dose:   The patient was treated to the left lung with a course of stereotactic body radiation treatment.  The patient received 60 Gray in 5 fractions using a SBRT/VMAT technique, with 2 fields.  Narrative: The patient tolerated radiation treatment relatively well.   No unexpected difficulties.  The patient's breathing did not significantly change during the course of the treatment.  Plan: The patient has completed radiation treatment. The patient will return to radiation oncology clinic for routine followup in one month. I advised the patient to call or return sooner if they have any questions or concerns related to their recovery or treatment. ________________________________  Jodelle Gross, M.D., Ph.D.

## 2019-10-01 ENCOUNTER — Ambulatory Visit (HOSPITAL_COMMUNITY)
Admission: RE | Admit: 2019-10-01 | Discharge: 2019-10-01 | Disposition: A | Discharge status: Home / Self Care | Payer: BC Managed Care – PPO | Source: Ambulatory Visit | Attending: Radiation Oncology | Admitting: Radiation Oncology

## 2019-10-01 ENCOUNTER — Encounter (HOSPITAL_COMMUNITY): Payer: Self-pay

## 2019-10-01 ENCOUNTER — Other Ambulatory Visit: Payer: Self-pay

## 2019-10-01 DIAGNOSIS — C3432 Malignant neoplasm of lower lobe, left bronchus or lung: Secondary | ICD-10-CM | POA: Diagnosis present

## 2019-10-01 MED ORDER — HEPARIN SOD (PORK) LOCK FLUSH 100 UNIT/ML IV SOLN
INTRAVENOUS | Status: AC
  Administered 2019-10-01: 500 [IU] via INTRAVENOUS
  Filled 2019-10-01: qty 5

## 2019-10-01 MED ORDER — SODIUM CHLORIDE (PF) 0.9 % IJ SOLN
INTRAMUSCULAR | Status: AC
  Filled 2019-10-01: qty 50

## 2019-10-01 MED ORDER — HEPARIN SOD (PORK) LOCK FLUSH 100 UNIT/ML IV SOLN: 500.0000 [IU] | Freq: Once | INTRAVENOUS | Status: DC

## 2019-10-01 MED ORDER — IOHEXOL 300 MG/ML  SOLN
75.0000 mL | Freq: Once | INTRAMUSCULAR | Status: AC | PRN
  Administered 2019-10-01: 75 mL via INTRAVENOUS

## 2019-10-02 ENCOUNTER — Telehealth: Payer: Self-pay

## 2019-10-02 ENCOUNTER — Telehealth: Payer: Self-pay | Admitting: Radiation Oncology

## 2019-10-02 ENCOUNTER — Encounter (HOSPITAL_COMMUNITY): Payer: Self-pay

## 2019-10-02 ENCOUNTER — Telehealth (HOSPITAL_COMMUNITY): Payer: Self-pay | Admitting: Family Medicine

## 2019-10-02 ENCOUNTER — Encounter: Payer: Self-pay | Admitting: Radiation Oncology

## 2019-10-02 ENCOUNTER — Other Ambulatory Visit: Payer: Self-pay

## 2019-10-02 NOTE — Telephone Encounter (Signed)
Spoke with patient in regards to telephone visit with Shona Simpson PA on 10/03/19 at 8:30am. Patient verbalized understanding of appointment date and time. Meaningful use questions were reviewed. TM

## 2019-10-02 NOTE — Telephone Encounter (Signed)
MR please advise, thanks!

## 2019-10-02 NOTE — Telephone Encounter (Signed)
I called the patient to let her know her CT scan results. Her treated site has improved since radiotherapy and that I would follow up with Dr. [name] to see if he would like me to order her scans for surveillance or if he prefers this.

## 2019-10-03 ENCOUNTER — Ambulatory Visit
Admission: RE | Admit: 2019-10-03 | Discharge: 2019-10-03 | Disposition: A | Discharge status: Home / Self Care | Payer: BC Managed Care – PPO | Source: Ambulatory Visit | Attending: Radiation Oncology | Admitting: Radiation Oncology

## 2019-10-03 ENCOUNTER — Telehealth: Payer: Self-pay | Admitting: Radiation Oncology

## 2019-10-03 NOTE — Telephone Encounter (Signed)
The patient called me back and had seen the results from her scan in my chart, we reviewed them, and reviewed our conversation from prior that she would like to have her next scan before the end of the year since she is already met this year's deductible.  I let her know that I thought that was completely reasonable and we be happy to coordinate that with Dr. [name] after he is given input about the preference for the type of scan and how he would recommend that we work together to follow her.  She is in agreement with this plan I will follow-up with her once I hear back from him.

## 2019-10-04 ENCOUNTER — Encounter (HOSPITAL_COMMUNITY)
Admission: RE | Admit: 2019-10-04 | Discharge: 2019-10-04 | Disposition: A | Discharge status: Home / Self Care | Payer: Self-pay | Source: Ambulatory Visit | Attending: Internal Medicine | Admitting: Internal Medicine

## 2019-10-04 ENCOUNTER — Other Ambulatory Visit: Payer: Self-pay

## 2019-10-04 MED FILL — ESBRIET 801 MG TABS: 801 | 30 days supply | Qty: 90 | Fill #2

## 2019-10-09 ENCOUNTER — Encounter (HOSPITAL_COMMUNITY)
Admission: RE | Admit: 2019-10-09 | Discharge: 2019-10-09 | Disposition: A | Discharge status: Home / Self Care | Payer: Self-pay | Source: Ambulatory Visit | Attending: Internal Medicine | Admitting: Internal Medicine

## 2019-10-09 ENCOUNTER — Other Ambulatory Visit: Payer: Self-pay

## 2019-10-11 ENCOUNTER — Encounter (HOSPITAL_COMMUNITY): Payer: Self-pay

## 2019-10-11 ENCOUNTER — Telehealth (HOSPITAL_COMMUNITY): Payer: Self-pay | Admitting: Family Medicine

## 2019-10-11 ENCOUNTER — Telehealth (HOSPITAL_COMMUNITY): Payer: Self-pay | Admitting: *Deleted

## 2019-10-11 NOTE — Telephone Encounter (Signed)
D/w patient. She wants me to take over the surveillance ct chest

## 2019-10-16 ENCOUNTER — Encounter (HOSPITAL_COMMUNITY): Payer: Self-pay

## 2019-10-18 ENCOUNTER — Ambulatory Visit: Payer: BC Managed Care – PPO | Attending: Internal Medicine

## 2019-10-18 ENCOUNTER — Encounter (HOSPITAL_COMMUNITY): Payer: Self-pay

## 2019-10-18 DIAGNOSIS — Z23 Encounter for immunization: Secondary | ICD-10-CM

## 2019-10-18 NOTE — Progress Notes (Signed)
   Covid-19 Vaccination Clinic  Name:  RAFIA SHEDDEN    MRN: [id number] DOB: 01/11/1971  10/18/2019  Ms. Cregger was observed post Covid-19 immunization for 15 minutes without incident. She was provided with Vaccine Information Sheet and instruction to access the V-Safe system.   Ms. Rizzolo was instructed to call 911 with any severe reactions post vaccine: Marland Kitchen Difficulty breathing  . Swelling of face and throat  . A fast heartbeat  . A bad rash all over body  . Dizziness and weakness

## 2019-10-23 ENCOUNTER — Telehealth (HOSPITAL_COMMUNITY): Payer: Self-pay | Admitting: *Deleted

## 2019-10-23 ENCOUNTER — Encounter (HOSPITAL_COMMUNITY): Payer: Self-pay

## 2019-10-24 ENCOUNTER — Telehealth (HOSPITAL_COMMUNITY): Payer: Self-pay | Admitting: *Deleted

## 2019-10-25 ENCOUNTER — Encounter (HOSPITAL_COMMUNITY): Payer: Self-pay

## 2019-10-25 ENCOUNTER — Ambulatory Visit: Payer: BC Managed Care – PPO | Admitting: Internal Medicine

## 2019-10-25 DIAGNOSIS — J84112 Idiopathic pulmonary fibrosis: Secondary | ICD-10-CM | POA: Insufficient documentation

## 2019-10-30 ENCOUNTER — Encounter (HOSPITAL_COMMUNITY): Payer: BC Managed Care – PPO

## 2019-10-30 ENCOUNTER — Encounter (HOSPITAL_COMMUNITY): Payer: Self-pay

## 2019-11-01 ENCOUNTER — Encounter (HOSPITAL_COMMUNITY): Admission: RE | Admit: 2019-11-01 | Payer: Self-pay | Source: Ambulatory Visit

## 2019-11-03 MED FILL — ESBRIET 801 MG TABS: 801 | 30 days supply | Qty: 90 | Fill #3

## 2019-11-06 ENCOUNTER — Encounter (HOSPITAL_COMMUNITY): Payer: Self-pay

## 2019-11-07 ENCOUNTER — Telehealth (HOSPITAL_COMMUNITY): Payer: Self-pay | Admitting: Family Medicine

## 2019-11-08 ENCOUNTER — Other Ambulatory Visit: Payer: Self-pay

## 2019-11-08 ENCOUNTER — Encounter (HOSPITAL_COMMUNITY)
Admission: RE | Admit: 2019-11-08 | Discharge: 2019-11-08 | Disposition: A | Discharge status: Home / Self Care | Payer: Self-pay | Source: Ambulatory Visit | Attending: Internal Medicine | Admitting: Internal Medicine

## 2019-11-09 ENCOUNTER — Telehealth: Payer: Self-pay | Admitting: Adult Health

## 2019-11-09 MED ORDER — CIPROFLOXACIN HCL 250 MG PO TABS
250.0000 mg | ORAL_TABLET | Freq: Two times a day (BID) | ORAL | 0 refills | Status: DC
Start: 2019-11-09 — End: 2019-12-28

## 2019-11-09 NOTE — Telephone Encounter (Signed)
Complains of 3 days of urinary urgency dysuria and irritation.  Patient has history of some intermittent urinary tract infections.  None for several months. Recently was out of town and had to use scented wipes which she believes caused the irritation. She denies any fever, back pain or nausea vomiting diarrhea Patient try to get in touch with her primary care provider but he was out of town.  Is worried about going to urgent care or emergency room due to COVID-19. We will call in Cipro 250 mg twice daily for 7 days Patient is aware if symptoms not improving or worsen she is to contact us immediately or go to the emergency room. Patient has a follow-up visit in 2 weeks.  At that visit would like her to have a urinalysis and urine culture. Please contact office for sooner follow up if symptoms do not improve or worsen or seek emergency care  Have advised her to follow-up with primary care going forward for her urinary care.

## 2019-11-09 NOTE — Telephone Encounter (Signed)
Primary Pulmonologist:  Dr.Ramamswamy  Last office visit and with whom: 09/12/19 What do we see them for (pulmonary problems): ILD Last OV assessment/plan:  Assessment:       ICD-10-CM   1. ILD (interstitial lung disease) ([place])  J84.9   2. 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 Chronic respiratory failure with hypoxia (HCC)  J96.11   4. Therapeutic drug monitoring  Z51.81   5. Nodule of lower lobe of left lung  R91.1   6. Presence of permanent central venous catheter  Z95.828    At some point in the future we can figure out about right heart catheterization after gaining some real well experience about inhaled Tyvaso and depending on the course of her illness.  Echo spring 2021 did not show clear-cut evidence of pulmonary artery pressure elevation.    Plan:     Patient Instructions     ICD-10-CM   1. ILD (interstitial lung disease) ([place])  J84.9   2. 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 Chronic respiratory failure with hypoxia (HCC)  J96.11   4. Therapeutic drug monitoring  Z51.81   5. Nodule of lower lobe of left lung  R91.1   6. Presence of permanent central venous catheter  Z95.828    ILD (interstitial lung disease) (HCC) NSIP (nonspecific interstitial pneumonia) (HCC) Chronic respiratory failure with hypoxia (HCC) Therapeutic drug monitoring  -Interstitial lung disease appears stable  Plan -Continue Esbriet full dose, nintedanib full dose and Imuran -Check CBC, chemistry and liver function test today -Okay to take ultra flora balance probiotic [discussed with Dr. Collene Mares 1/day-cheapest on [email or web address],.  She also indicated to avoid artificial sweeteners -Awaiting to hear more on hyperbaric oxygen -Continue weight loss Jacqulyn Liner work losing 15 pounds] - spiro/dlco at [place]  Nodule of lower lobe of left lung -status post radiation ending July 2021  Plan -CT scan of the chest follow-up per radiation  oncology  Presence of permanent central venous catheter  -Currently no indication to keep it  Plan -Arrange for removal  Follow-up -6-8 weeks with Dr. [name] 30-minute visit Eyvonne Mechanic schedule still not made]       SIGNATURE    Dr. Brand Males, M.D., F.C.C.P,  Pulmonary and Critical Care Medicine Staff Physician, [place] Director - Interstitial Lung Disease  Program  Pulmonary [place] [facility] [place], [place], [address]  Pager: [phone], If no answer or between  15:00h - 7:00h: call [phone] Telephone: [phone]  2:58 PM 09/11/2019     Patient Instructions by Brand Males, MD at 09/11/2019 2:00 PM Author: Brand Males, MD Author Type: Physician Filed: 09/11/2019 2:58 PM  Note Status: Addendum Cosign: Cosign Not Required Encounter Date: 09/11/2019  Editor: Brand Males, MD (Physician)      Prior Versions: 1. Brand Males, MD (Physician) at 09/11/2019 2:46 PM - Addendum   2. Brand Males, MD (Physician) at 09/11/2019 2:40 PM - Signed      ICD-10-CM   1. ILD (interstitial lung disease) ([place])  J84.9   2. 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 Chronic respiratory failure with hypoxia (HCC)  J96.11   4. Therapeutic drug monitoring  Z51.81   5. Nodule of lower lobe of left lung  R91.1   6. Presence of permanent central venous catheter  Z95.828    ILD (interstitial lung disease) (HCC) NSIP (nonspecific interstitial pneumonia) (HCC) Chronic respiratory failure  with hypoxia ([place]) Therapeutic drug monitoring  -Interstitial lung disease appears stable  Plan -Continue Esbriet full dose, nintedanib full dose and Imuran -Check CBC, chemistry and liver function test today -Okay to take ultra flora balance probiotic [discussed with Dr. Collene Mares 1/day-cheapest on [email or web address],.  She also indicated to avoid artificial  sweeteners -Awaiting to hear more on hyperbaric oxygen -Continue weight loss Jacqulyn Liner work losing 15 pounds] - spiro/dlco at [place]  Nodule of lower lobe of left lung -status post radiation ending July 2021  Plan -CT scan of the chest follow-up per radiation oncology  Presence of permanent central venous catheter  -Currently no indication to keep it  Plan -Arrange for removal  Follow-up -6-8 weeks with Dr. [name] 30-minute visit Eyvonne Mechanic schedule still not made]      Instructions    ICD-10-CM   1. ILD (interstitial lung disease) ([place])  J84.9   2. 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 Chronic respiratory failure with hypoxia (HCC)  J96.11   4. Therapeutic drug monitoring  Z51.81   5. Nodule of lower lobe of left lung  R91.1   6. Presence of permanent central venous catheter  Z95.828    ILD (interstitial lung disease) (HCC) NSIP (nonspecific interstitial pneumonia) (HCC) Chronic respiratory failure with hypoxia (HCC) Therapeutic drug monitoring  -Interstitial lung disease appears stable  Plan -Continue Esbriet full dose, nintedanib full dose and Imuran -Check CBC, chemistry and liver function test today -Okay to take ultra flora balance probiotic [discussed with Dr. Collene Mares 1/day-cheapest on [email or web address],.  She also indicated to avoid artificial sweeteners -Awaiting to hear more on hyperbaric oxygen -Continue weight loss Jacqulyn Liner work losing 15 pounds] - spiro/dlco at [place]  Nodule of lower lobe of left lung -status post radiation ending July 2021  Plan -CT scan of the chest follow-up per radiation oncology  Presence of permanent central venous catheter  -Currently no indication to keep it  Plan -Arrange for removal  Follow-up -6-8 weeks with Dr. [name] 30-minute visit Eyvonne Mechanic schedule still not made]          After Visit Summary (Printed 09/11/2019) Communications    [facility] Provider CC Chart  Rep sent to [name], MD and [name], MD Media From this encounter Electronic signature on 09/11/2019 1:48 PM - 1 of 3 e-signatures recorded  Communication Routing History  Recipient Method Sent by Date Sent  [name], MD Fax Brand Males, MD 09/11/2019  Fax: [phone]  Phone: [phone]   [name], MD Fax Brand Males, MD 09/11/2019  Fax: [phone]  Phone: [phone]   No questionnaires available.        Was appointment offered to patient (explain)?    Reason for call: Patient stated she has a UTI. Husband bought patient cleaning wipes and was the wrong one's. 0 Fever,Advised patient she would need to contact her PCP . Patient stated that she has shingles and has to send pictures to her PCP.Patint stated that [name] knows she to send d. Advised patient I will send this message to [name] and will; call patient back with POC.   [name] can you please advise.    Thank you      Allergies  Allergen Reactions   Macrobid [Nitrofurantoin Macrocrystal] Nausea And Vomiting   Adhesive [Tape] Rash    Immunization History  Administered Date(s) Administered   Influenza,inj,Quad PF,6+ Mos 12/24/2014, 02/05/2016, 01/07/2017, 11/22/2017, 10/20/2018   Influenza-Unspecified 12/24/2014, 02/05/2016, 01/07/2017, 11/22/2017   PFIZER SARS-COV-2 Vaccination 04/06/2019, 04/30/2019, 10/18/2019   Pneumococcal  Conjugate-13 07/20/2017   Pneumococcal Polysaccharide-23 06/09/2012, 01/03/2019   Tdap 02/19/2011

## 2019-11-09 NOTE — Telephone Encounter (Signed)
Pt sent mychart message as well in regards to the UTI. [name], please advise.

## 2019-11-13 ENCOUNTER — Encounter (HOSPITAL_COMMUNITY): Payer: Self-pay

## 2019-11-15 ENCOUNTER — Encounter (HOSPITAL_COMMUNITY): Admission: RE | Admit: 2019-11-15 | Payer: Self-pay | Source: Ambulatory Visit

## 2019-11-20 ENCOUNTER — Encounter (HOSPITAL_COMMUNITY)
Admission: RE | Admit: 2019-11-20 | Discharge: 2019-11-20 | Disposition: A | Discharge status: Home / Self Care | Payer: Self-pay | Source: Ambulatory Visit | Attending: Internal Medicine | Admitting: Internal Medicine

## 2019-11-20 ENCOUNTER — Other Ambulatory Visit: Payer: Self-pay

## 2019-11-20 ENCOUNTER — Ambulatory Visit (HOSPITAL_COMMUNITY)
Admission: RE | Admit: 2019-11-20 | Discharge: 2019-11-20 | Disposition: A | Discharge status: Home / Self Care | Payer: BC Managed Care – PPO | Source: Ambulatory Visit | Attending: Internal Medicine | Admitting: Internal Medicine

## 2019-11-20 DIAGNOSIS — Z452 Encounter for adjustment and management of vascular access device: Secondary | ICD-10-CM | POA: Insufficient documentation

## 2019-11-20 MED ORDER — HEPARIN SOD (PORK) LOCK FLUSH 100 UNIT/ML IV SOLN
500.0000 [IU] | INTRAVENOUS | Status: AC | PRN
  Administered 2019-11-20: 500 [IU]
  Filled 2019-11-20: qty 5

## 2019-11-20 MED ORDER — HEPARIN SOD (PORK) LOCK FLUSH 100 UNIT/ML IV SOLN: 250.0000 [IU] | INTRAVENOUS | Status: DC | PRN

## 2019-11-20 MED ORDER — SODIUM CHLORIDE 0.9% FLUSH
10.0000 mL | INTRAVENOUS | Status: AC | PRN
  Administered 2019-11-20: 10 mL

## 2019-11-20 NOTE — Progress Notes (Signed)
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 PAC flushed with Heparin and 0.9% Sodium Chloride and de-accessed. Patient alert, oriented and ambulatory at discharge.             

## 2019-11-22 ENCOUNTER — Encounter (HOSPITAL_COMMUNITY)
Admission: RE | Admit: 2019-11-22 | Discharge: 2019-11-22 | Disposition: A | Discharge status: Home / Self Care | Payer: Self-pay | Source: Ambulatory Visit | Attending: Internal Medicine | Admitting: Internal Medicine

## 2019-11-22 ENCOUNTER — Other Ambulatory Visit: Payer: Self-pay

## 2019-11-27 ENCOUNTER — Encounter (HOSPITAL_COMMUNITY): Admission: RE | Admit: 2019-11-27 | Payer: Self-pay | Source: Ambulatory Visit

## 2019-11-27 ENCOUNTER — Ambulatory Visit: Payer: BC Managed Care – PPO | Admitting: Internal Medicine

## 2019-11-27 DIAGNOSIS — J84112 Idiopathic pulmonary fibrosis: Secondary | ICD-10-CM | POA: Insufficient documentation

## 2019-11-29 ENCOUNTER — Encounter (HOSPITAL_COMMUNITY): Payer: Self-pay

## 2019-12-03 MED FILL — ESBRIET 801 MG TABS: 801 | 30 days supply | Qty: 90 | Fill #4

## 2019-12-04 ENCOUNTER — Encounter (HOSPITAL_COMMUNITY): Payer: Self-pay

## 2019-12-06 ENCOUNTER — Other Ambulatory Visit: Payer: Self-pay

## 2019-12-06 ENCOUNTER — Encounter (HOSPITAL_COMMUNITY)
Admission: RE | Admit: 2019-12-06 | Discharge: 2019-12-06 | Disposition: A | Discharge status: Home / Self Care | Payer: Self-pay | Source: Ambulatory Visit | Attending: Internal Medicine | Admitting: Internal Medicine

## 2019-12-11 ENCOUNTER — Other Ambulatory Visit: Payer: Self-pay

## 2019-12-11 ENCOUNTER — Encounter (HOSPITAL_COMMUNITY)
Admission: RE | Admit: 2019-12-11 | Discharge: 2019-12-11 | Disposition: A | Discharge status: Home / Self Care | Payer: BC Managed Care – PPO | Source: Ambulatory Visit | Attending: Internal Medicine | Admitting: Internal Medicine

## 2019-12-12 ENCOUNTER — Other Ambulatory Visit: Payer: Self-pay | Admitting: Internal Medicine

## 2019-12-12 DIAGNOSIS — J849 Interstitial pulmonary disease, unspecified: Secondary | ICD-10-CM

## 2019-12-13 ENCOUNTER — Encounter (HOSPITAL_COMMUNITY)
Admission: RE | Admit: 2019-12-13 | Discharge: 2019-12-13 | Disposition: A | Discharge status: Home / Self Care | Payer: BC Managed Care – PPO | Source: Ambulatory Visit | Attending: Internal Medicine | Admitting: Internal Medicine

## 2019-12-13 ENCOUNTER — Other Ambulatory Visit: Payer: Self-pay

## 2019-12-18 ENCOUNTER — Telehealth (HOSPITAL_COMMUNITY): Payer: Self-pay | Admitting: Family Medicine

## 2019-12-18 ENCOUNTER — Encounter (HOSPITAL_COMMUNITY): Payer: Self-pay

## 2019-12-20 ENCOUNTER — Encounter (HOSPITAL_COMMUNITY): Payer: Self-pay

## 2019-12-21 ENCOUNTER — Other Ambulatory Visit: Payer: Self-pay

## 2019-12-21 ENCOUNTER — Telehealth: Payer: Self-pay | Admitting: Internal Medicine

## 2019-12-21 ENCOUNTER — Inpatient Hospital Stay (HOSPITAL_COMMUNITY)
Admission: EM | Admit: 2019-12-21 | Discharge: 2019-12-28 | DRG: 690 | Disposition: A | Discharge status: Home / Self Care | Payer: BC Managed Care – PPO | Attending: Internal Medicine | Admitting: Internal Medicine

## 2019-12-21 ENCOUNTER — Encounter (HOSPITAL_COMMUNITY): Payer: Self-pay | Admitting: Emergency Medicine

## 2019-12-21 DIAGNOSIS — K649 Unspecified hemorrhoids: Secondary | ICD-10-CM | POA: Diagnosis present

## 2019-12-21 DIAGNOSIS — E119 Type 2 diabetes mellitus without complications: Secondary | ICD-10-CM | POA: Diagnosis present

## 2019-12-21 DIAGNOSIS — N390 Urinary tract infection, site not specified: Secondary | ICD-10-CM | POA: Diagnosis present

## 2019-12-21 DIAGNOSIS — Z79899 Other long term (current) drug therapy: Secondary | ICD-10-CM

## 2019-12-21 DIAGNOSIS — Z881 Allergy status to other antibiotic agents status: Secondary | ICD-10-CM

## 2019-12-21 DIAGNOSIS — N179 Acute kidney failure, unspecified: Secondary | ICD-10-CM | POA: Diagnosis present

## 2019-12-21 DIAGNOSIS — K3580 Unspecified acute appendicitis: Secondary | ICD-10-CM | POA: Diagnosis present

## 2019-12-21 DIAGNOSIS — R319 Hematuria, unspecified: Secondary | ICD-10-CM

## 2019-12-21 DIAGNOSIS — Z85118 Personal history of other malignant neoplasm of bronchus and lung: Secondary | ICD-10-CM

## 2019-12-21 DIAGNOSIS — Z23 Encounter for immunization: Secondary | ICD-10-CM

## 2019-12-21 DIAGNOSIS — J8410 Pulmonary fibrosis, unspecified: Secondary | ICD-10-CM | POA: Diagnosis present

## 2019-12-21 DIAGNOSIS — D649 Anemia, unspecified: Secondary | ICD-10-CM | POA: Diagnosis present

## 2019-12-21 DIAGNOSIS — Z7952 Long term (current) use of systemic steroids: Secondary | ICD-10-CM

## 2019-12-21 DIAGNOSIS — N3001 Acute cystitis with hematuria: Secondary | ICD-10-CM | POA: Diagnosis not present

## 2019-12-21 DIAGNOSIS — B964 Proteus (mirabilis) (morganii) as the cause of diseases classified elsewhere: Secondary | ICD-10-CM | POA: Diagnosis present

## 2019-12-21 DIAGNOSIS — E538 Deficiency of other specified B group vitamins: Secondary | ICD-10-CM | POA: Diagnosis present

## 2019-12-21 DIAGNOSIS — E559 Vitamin D deficiency, unspecified: Secondary | ICD-10-CM | POA: Diagnosis present

## 2019-12-21 DIAGNOSIS — D849 Immunodeficiency, unspecified: Secondary | ICD-10-CM | POA: Diagnosis present

## 2019-12-21 DIAGNOSIS — E039 Hypothyroidism, unspecified: Secondary | ICD-10-CM | POA: Diagnosis present

## 2019-12-21 DIAGNOSIS — E871 Hypo-osmolality and hyponatremia: Secondary | ICD-10-CM | POA: Diagnosis present

## 2019-12-21 DIAGNOSIS — Z1624 Resistance to multiple antibiotics: Secondary | ICD-10-CM | POA: Diagnosis present

## 2019-12-21 DIAGNOSIS — Z8249 Family history of ischemic heart disease and other diseases of the circulatory system: Secondary | ICD-10-CM

## 2019-12-21 DIAGNOSIS — R197 Diarrhea, unspecified: Secondary | ICD-10-CM | POA: Diagnosis present

## 2019-12-21 DIAGNOSIS — Z87440 Personal history of urinary (tract) infections: Secondary | ICD-10-CM

## 2019-12-21 DIAGNOSIS — B372 Candidiasis of skin and nail: Secondary | ICD-10-CM | POA: Diagnosis present

## 2019-12-21 DIAGNOSIS — Z923 Personal history of irradiation: Secondary | ICD-10-CM

## 2019-12-21 DIAGNOSIS — J9611 Chronic respiratory failure with hypoxia: Secondary | ICD-10-CM | POA: Diagnosis present

## 2019-12-21 DIAGNOSIS — Z79890 Hormone replacement therapy (postmenopausal): Secondary | ICD-10-CM

## 2019-12-21 DIAGNOSIS — G8929 Other chronic pain: Secondary | ICD-10-CM | POA: Diagnosis present

## 2019-12-21 DIAGNOSIS — Z95828 Presence of other vascular implants and grafts: Secondary | ICD-10-CM

## 2019-12-21 DIAGNOSIS — Z9109 Other allergy status, other than to drugs and biological substances: Secondary | ICD-10-CM

## 2019-12-21 DIAGNOSIS — E878 Other disorders of electrolyte and fluid balance, not elsewhere classified: Secondary | ICD-10-CM | POA: Diagnosis present

## 2019-12-21 DIAGNOSIS — R300 Dysuria: Secondary | ICD-10-CM | POA: Diagnosis not present

## 2019-12-21 DIAGNOSIS — I10 Essential (primary) hypertension: Secondary | ICD-10-CM | POA: Diagnosis present

## 2019-12-21 DIAGNOSIS — J84111 Idiopathic interstitial pneumonia, not otherwise specified: Secondary | ICD-10-CM | POA: Diagnosis present

## 2019-12-21 DIAGNOSIS — Z7984 Long term (current) use of oral hypoglycemic drugs: Secondary | ICD-10-CM

## 2019-12-21 DIAGNOSIS — Z9981 Dependence on supplemental oxygen: Secondary | ICD-10-CM

## 2019-12-21 DIAGNOSIS — D7589 Other specified diseases of blood and blood-forming organs: Secondary | ICD-10-CM | POA: Diagnosis present

## 2019-12-21 DIAGNOSIS — K5900 Constipation, unspecified: Secondary | ICD-10-CM | POA: Diagnosis present

## 2019-12-21 DIAGNOSIS — R109 Unspecified abdominal pain: Secondary | ICD-10-CM

## 2019-12-21 DIAGNOSIS — J849 Interstitial pulmonary disease, unspecified: Secondary | ICD-10-CM

## 2019-12-21 DIAGNOSIS — E876 Hypokalemia: Secondary | ICD-10-CM | POA: Diagnosis present

## 2019-12-21 DIAGNOSIS — D638 Anemia in other chronic diseases classified elsewhere: Secondary | ICD-10-CM | POA: Diagnosis present

## 2019-12-21 DIAGNOSIS — Z20822 Contact with and (suspected) exposure to covid-19: Secondary | ICD-10-CM | POA: Diagnosis present

## 2019-12-21 HISTORY — DX: Malignant (primary) neoplasm, unspecified: C80.1

## 2019-12-21 LAB — CBC
HCT: 43.3 % (ref 36.0–46.0)
Hemoglobin: 14.1 g/dL (ref 12.0–15.0)
MCH: 31.3 pg (ref 26.0–34.0)
MCHC: 32.6 g/dL (ref 30.0–36.0)
MCV: 96 fL (ref 80.0–100.0)
Platelets: 294 10*3/uL (ref 150–400)
RBC: 4.51 MIL/uL (ref 3.87–5.11)
RDW: 14.1 % (ref 11.5–15.5)
WBC: 9 10*3/uL (ref 4.0–10.5)
nRBC: 0 % (ref 0.0–0.2)

## 2019-12-21 LAB — URINALYSIS, ROUTINE W REFLEX MICROSCOPIC
Bacteria, UA: NONE SEEN
Bilirubin Urine: NEGATIVE
Glucose, UA: NEGATIVE mg/dL
Hgb urine dipstick: NEGATIVE
Ketones, ur: 5 mg/dL — AB
Leukocytes,Ua: NEGATIVE
Nitrite: POSITIVE — AB
Protein, ur: 100 mg/dL — AB
Specific Gravity, Urine: 1.009 (ref 1.005–1.030)
pH: 7 (ref 5.0–8.0)

## 2019-12-21 LAB — BASIC METABOLIC PANEL
Anion gap: 20 — ABNORMAL HIGH (ref 5–15)
BUN: 10 mg/dL (ref 6–20)
CO2: 29 mmol/L (ref 22–32)
Calcium: 9.9 mg/dL (ref 8.9–10.3)
Chloride: 82 mmol/L — ABNORMAL LOW (ref 98–111)
Creatinine, Ser: 1.05 mg/dL — ABNORMAL HIGH (ref 0.44–1.00)
GFR, Estimated: >60 mL/min (ref >60)
Glucose, Bld: 159 mg/dL — ABNORMAL HIGH (ref 70–99)
Potassium: 3.2 mmol/L — ABNORMAL LOW (ref 3.5–5.1)
Sodium: 131 mmol/L — ABNORMAL LOW (ref 135–145)

## 2019-12-21 MED ORDER — POTASSIUM CHLORIDE 10 MEQ/100ML IV SOLN
10.0000 meq | INTRAVENOUS | Status: AC
  Administered 2019-12-21 – 2019-12-22 (×4): 10 meq via INTRAVENOUS
  Filled 2019-12-21 (×4): qty 100

## 2019-12-21 MED ORDER — OXYCODONE HCL 5 MG PO TABS: 5.0000 mg | ORAL_TABLET | ORAL | Status: DC | PRN

## 2019-12-21 MED ORDER — THYROID 60 MG PO TABS
90.0000 mg | ORAL_TABLET | Freq: Every day | ORAL | Status: DC
  Administered 2019-12-22 – 2019-12-28 (×7): 90 mg via ORAL
  Filled 2019-12-21 (×7): qty 1

## 2019-12-21 MED ORDER — CARVEDILOL 6.25 MG PO TABS
6.2500 mg | ORAL_TABLET | Freq: Two times a day (BID) | ORAL | Status: DC
  Administered 2019-12-22 – 2019-12-28 (×14): 6.25 mg via ORAL
  Filled 2019-12-21 (×13): qty 1

## 2019-12-21 MED ORDER — ONDANSETRON HCL 4 MG/2ML IJ SOLN
4.0000 mg | Freq: Four times a day (QID) | INTRAMUSCULAR | Status: DC | PRN
  Administered 2019-12-22 – 2019-12-25 (×5): 4 mg via INTRAVENOUS
  Filled 2019-12-21 (×7): qty 2

## 2019-12-21 MED ORDER — MONTELUKAST SODIUM 10 MG PO TABS
10.0000 mg | ORAL_TABLET | Freq: Every day | ORAL | Status: DC
  Administered 2019-12-22 – 2019-12-27 (×7): 10 mg via ORAL
  Filled 2019-12-21 (×7): qty 1

## 2019-12-21 MED ORDER — SODIUM CHLORIDE 0.9 % IV BOLUS: 500.0000 mL | Freq: Once | INTRAVENOUS | Status: DC

## 2019-12-21 MED ORDER — AZATHIOPRINE 50 MG PO TABS
100.0000 mg | ORAL_TABLET | Freq: Every day | ORAL | Status: DC
  Administered 2019-12-22 – 2019-12-23 (×2): 100 mg via ORAL
  Filled 2019-12-21 (×2): qty 2

## 2019-12-21 MED ORDER — METHOCARBAMOL 1000 MG/10ML IJ SOLN
500.0000 mg | Freq: Four times a day (QID) | INTRAVENOUS | Status: DC | PRN
  Administered 2019-12-22 – 2019-12-28 (×6): 500 mg via INTRAVENOUS
  Filled 2019-12-21 (×2): qty 5
  Filled 2019-12-21 (×5): qty 500

## 2019-12-21 MED ORDER — ALBUTEROL SULFATE HFA 108 (90 BASE) MCG/ACT IN AERS
2.0000 | INHALATION_SPRAY | Freq: Four times a day (QID) | RESPIRATORY_TRACT | Status: DC | PRN
  Filled 2019-12-21: qty 6.7

## 2019-12-21 MED ORDER — HEPARIN SODIUM (PORCINE) 5000 UNIT/ML IJ SOLN
5000.0000 [IU] | Freq: Three times a day (TID) | INTRAMUSCULAR | Status: DC
  Administered 2019-12-21 – 2019-12-28 (×18): 5000 [IU] via SUBCUTANEOUS
  Filled 2019-12-21 (×20): qty 1

## 2019-12-21 MED ORDER — FAMOTIDINE 20 MG PO TABS
40.0000 mg | ORAL_TABLET | Freq: Every day | ORAL | Status: DC
  Administered 2019-12-22 – 2019-12-28 (×7): 40 mg via ORAL
  Filled 2019-12-21 (×8): qty 2

## 2019-12-21 MED ORDER — ONDANSETRON HCL 4 MG PO TABS
4.0000 mg | ORAL_TABLET | Freq: Four times a day (QID) | ORAL | Status: DC | PRN
  Administered 2019-12-26 – 2019-12-28 (×4): 4 mg via ORAL
  Filled 2019-12-21 (×5): qty 1

## 2019-12-21 MED ORDER — PIRFENIDONE 801 MG PO TABS
1.0000 | ORAL_TABLET | Freq: Three times a day (TID) | ORAL | Status: DC
  Administered 2019-12-22 – 2019-12-28 (×15): 1 via ORAL

## 2019-12-21 MED ORDER — SODIUM CHLORIDE 0.9 % IV BOLUS
1000.0000 mL | Freq: Once | INTRAVENOUS | Status: AC
  Administered 2019-12-21: 1000 mL via INTRAVENOUS

## 2019-12-21 MED ORDER — SULFAMETHOXAZOLE-TRIMETHOPRIM 400-80 MG PO TABS
1.0000 | ORAL_TABLET | ORAL | Status: DC
  Administered 2019-12-22 – 2019-12-28 (×4): 1 via ORAL
  Filled 2019-12-21 (×4): qty 1

## 2019-12-21 MED ORDER — PHENAZOPYRIDINE HCL 200 MG PO TABS
200.0000 mg | ORAL_TABLET | Freq: Three times a day (TID) | ORAL | Status: DC
  Administered 2019-12-22 – 2019-12-25 (×10): 200 mg via ORAL
  Filled 2019-12-21 (×12): qty 1

## 2019-12-21 MED ORDER — ACETAMINOPHEN 650 MG RE SUPP: 650.0000 mg | Freq: Four times a day (QID) | RECTAL | Status: DC | PRN

## 2019-12-21 MED ORDER — GABAPENTIN 100 MG PO CAPS
100.0000 mg | ORAL_CAPSULE | Freq: Two times a day (BID) | ORAL | Status: DC
  Administered 2019-12-21 – 2019-12-28 (×14): 100 mg via ORAL
  Filled 2019-12-21 (×15): qty 1

## 2019-12-21 MED ORDER — ACETAMINOPHEN 325 MG PO TABS
650.0000 mg | ORAL_TABLET | Freq: Four times a day (QID) | ORAL | Status: DC | PRN
  Administered 2019-12-23 – 2019-12-27 (×6): 650 mg via ORAL
  Filled 2019-12-21 (×6): qty 2

## 2019-12-21 MED ORDER — SPIRONOLACTONE 25 MG PO TABS
100.0000 mg | ORAL_TABLET | Freq: Every day | ORAL | Status: DC
  Administered 2019-12-22 – 2019-12-28 (×7): 100 mg via ORAL
  Filled 2019-12-21 (×8): qty 4

## 2019-12-21 MED ORDER — PREDNISONE 5 MG PO TABS
10.0000 mg | ORAL_TABLET | Freq: Every day | ORAL | Status: DC
  Administered 2019-12-22 – 2019-12-28 (×7): 10 mg via ORAL
  Filled 2019-12-21 (×7): qty 2

## 2019-12-21 MED ORDER — LORAZEPAM 0.5 MG PO TABS
0.5000 mg | ORAL_TABLET | Freq: Four times a day (QID) | ORAL | Status: DC | PRN
  Administered 2019-12-24: 0.5 mg via ORAL
  Filled 2019-12-21: qty 1

## 2019-12-21 MED ORDER — SODIUM CHLORIDE 0.9 % IV SOLN: INTRAVENOUS | Status: DC

## 2019-12-21 MED ORDER — SODIUM CHLORIDE 0.9 % IV SOLN
1.0000 g | Freq: Three times a day (TID) | INTRAVENOUS | Status: DC
  Administered 2019-12-21 – 2019-12-26 (×14): 1 g via INTRAVENOUS
  Filled 2019-12-21 (×16): qty 1

## 2019-12-21 NOTE — Telephone Encounter (Signed)
Pt has a bacteria infection is in her urine. Pt has been on 2 different courses of cipro- bacteria resistant to cipro. Pt then got on Augmentin for 10 days. Augmentin not strong enough. Infectious disease involved and pt PCP wants to admit pt to get IV antibiotics. Pt's dr is not with [facility], they're with [facility]. does not want to go to [facility] . Pt wanting to be able to be followed by our dr's/MR  in the hospital if infection went to lungs. Just wanting advice on the situation. Please advise.[phone]

## 2019-12-21 NOTE — Telephone Encounter (Signed)
Spoke with patient. She stated that her PCP has gotten infectious disease involved with her UTI. PCP wants her to go to [facility] to be admitted for IV antibiotics. She is hesitant to go to any hospital other than [facility] because she is scared something will happen to her lungs and MR will not be kept in the loop.   I advised her that neither MR or TP were in the office but she does not need to wait until Monday when they are available again. She agreed and her husband agreed. She has decided to come to the ED at [facility].   Will route this message to TP and MR as she has requested.

## 2019-12-21 NOTE — ED Triage Notes (Addendum)
Patient did chemo for her pulmonary fibrosis on chronic O2, since then has been having a UTI 3x over the last year. Has been having urinary symptoms since September, has taken cipro, finished doses, taken stronger doses of cipro,  Augmentin, and another round of augmentin. PCP sent her here for IV abx. Endorses painful urination. Also wants a pulmonary consult so her pulmonologist will be notified.

## 2019-12-21 NOTE — Progress Notes (Signed)
Pharmacy Antibiotic Note  SILVIE OBREMSKI is a 49 y.o. female admitted on 12/21/2019 with ESBL Proteus  UTI.  Pharmacy has been consulted for meropenem dosing.  Plan: Meropenem 1 g iv q 8 hours  F/U renal function, cultures, clinical course  Height: _0  (157.5 cm) Weight: 90.3 kg (199 lb) IBW/kg (Calculated) : 50.1  Temp (24hrs), Avg:97.8 F (36.6 C), Min:97.8 F (36.6 C), Max:97.8 F (36.6 C)  Recent Labs  Lab 12/21/19 1908  WBC 9.0  CREATININE 1.05*    Estimated Creatinine Clearance: 66.2 mL/min (A) (by C-G formula based on SCr of 1.05 mg/dL (H)).    Allergies  Allergen Reactions  . Macrobid [Nitrofurantoin Macrocrystal] Nausea And Vomiting  . Adhesive [Tape] Rash    Antimicrobials this admission: 10/29 meropenem >>   Dose adjustments this admission:  Microbiology results: 10/26 UCx from [facility]: ESBL Proteus mirabilis sens to meropenem  BCx: sent UCx: sent    Thank you for allowing pharmacy to be a part of this patient's care.  Napoleon Form 12/21/2019 8:19 PM

## 2019-12-21 NOTE — ED Provider Notes (Signed)
[facility] DEPT Provider Note   CSN: [id number] Arrival date & time: 12/21/19  1825     History Chief Complaint  Patient presents with  . Urinary Tract Infection    Tasha Ballard is a 49 y.o. female w/ hx of ILD/Pulmonary Fibrosis (on 8-12 L [place] at home), HTN, diabetes, recurrent UTI, presenting to the ED with dysuria.  Patient reports that for the past month she has been having persistent dysuria, and was told twice on outpatient cultures that she had a urine infection.  Her original set of cultures were at the end of September, growing Proteus Mirabella's with multidrug-resistant, and she was given a full course of ciprofloxacin (the bacteria was resistant to this), followed by Augmentin (bacteria was sensitive to this).  She has some brief improvement of her dysuria after the Augmentin course, but then her symptoms returned the week afterwards.  She reports nausea and poor appetite, significant burning and discomfort with urination.  Her outpatient doctor at [facility] ordered another urine culture which resulted 3 days ago, now showing 100,000K Proteus Mirablis, MDR.  The only p.o. susceptibility listed was for Augmentin.  She was started on that again but is not having any improvement.  At this point her doctor discussed the case with infectious disease doctors who determined that she needs to come to the hospital for IV antibiotics.  It was recommended that she go to [facility], but the patient determined that she rather come to [facility], as her pulmonologist Dr. [name] is here.    She is not currently on chemotherapy.  HPI     Past Medical History:  Diagnosis Date  . Aneurysm ([place])   . Back pain   . Diabetes mellitus without complication ([place])   . H/O blood clots   . Hypertension   . Joint pain   . Pulmonary fibrosis ([place])   . SOB (shortness of breath)   . Swelling   . Thyroid disease   . Vitamin D deficiency     Patient Active  Problem List   Diagnosis Date Noted  . UTI (urinary tract infection) 12/21/2019  . Malignant neoplasm of bronchus of left lower lobe ([place]) 08/06/2019  . Chronic respiratory failure with hypoxia ([place]) 05/28/2019  . Acute sore throat 10/04/2018  . Medication management 10/04/2018  . Rash 03/20/2018  . Leukopenia 03/20/2018  . ILD (interstitial lung disease) ([place]) 02/03/2018  . 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 On Cellcept therapy 04/19/2017  . Acute on chronic respiratory failure with hypoxia ([place]) 04/18/2017  . Pedal edema 12/30/2016  . 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 Sinus tachycardia 10/12/2016  . Thoracic lymphadenopathy 10/12/2016  . 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 Morbid obesity with BMI of 40.0-44.9, adult ([place]) 12/09/2015  . Oxygen dependent 05/26/2015  . Morbid obesity ([place]) 04/02/2015  . Other specified interstitial pulmonary diseases ([place]) 10/30/2013  . 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 Long term current use of systemic steroids 10/22/2011  . Diverticulitis of intestine 10/22/2011  . Hypothyroidism 10/22/2011  . Pneumonia 10/22/2011  . Cough 10/01/2011  . Interstitial pulmonary disease ([place]) 09/10/2011    Past Surgical History:  Procedure Laterality Date  . ABLATION    . IR IMAGING GUIDED PORT INSERTION  01/26/2018  . KNEE SURGERY    . LUNG BIOPSY       OB History    Gravida  0   Para  0   Term  0   Preterm  0   AB  0   Living  0     SAB  0   TAB  0   Ectopic  0   Multiple  0   Live Births  0           Family History  Problem Relation Age of Onset  . Heart failure Mother   . Heart disease Mother   . Rheumatologic disease Father     Social History   Tobacco Use  . Smoking status: Never Smoker  . Smokeless tobacco: Never Used  Vaping Use  . Vaping Use: Never used  Substance Use Topics  . Alcohol use: Yes    Comment:  Occasional  . Drug use: Never    Home Medications Prior to Admission medications   Medication Sig Start Date End Date Taking? Authorizing Provider  azathioprine (IMURAN) 100 MG tablet Take 1 tablet (100 mg total) by mouth daily. 07/05/19  Yes Brand Males, MD  carvedilol (COREG) 6.25 MG tablet Take 6.25 mg by mouth 2 (two) times daily with a meal.   Yes [provider]  ESBRIET 801 MG TABS TAKE 1 TABLET BY MOUTH 3 (THREE) TIMES DAILY WITH MEALS. 07/31/19  Yes Brand Males, MD  famotidine (PEPCID) 40 MG tablet TAKE 1 TABLET BY MOUTH EVERY DAY Patient taking differently: Take 40 mg by mouth daily.  08/16/19  Yes Brand Males, MD  gabapentin (NEURONTIN) 100 MG capsule Take 1 capsule by mouth daily as needed (For pain).  09/06/18  Yes [provider]  metFORMIN (GLUCOPHAGE) 500 MG tablet Take 500 mg by mouth 2 (two) times daily.    Yes [provider]  montelukast (SINGULAIR) 10 MG tablet Take 1 tablet (10 mg total) by mouth daily after supper. 06/28/19  Yes Ramaswamy, Belva Crome, MD  OFEV 150 MG CAPS TAKE 1 CAPSULE TWICE A DAY (12 HOURS APART) WITH FOOD Patient taking differently: Take 150 mg by mouth in the morning and at bedtime.  12/12/19  Yes Brand Males, MD  ondansetron (ZOFRAN) 8 MG tablet TAKE 1 TABLET BY MOUTH EVERY 8 HOURS AS NEEDED FOR NAUSEA AND VOMITING Patient taking differently: Take 8 mg by mouth every 8 (eight) hours as needed for nausea or vomiting.  06/28/19  Yes Brand Males, MD  potassium chloride (KLOR-CON) 10 MEQ tablet Take 3 tablets (30 mEq total) by mouth daily. 09/19/19  Yes Brand Males, MD  predniSONE (DELTASONE) 10 MG tablet Take 10-84m as needed for SOB Patient taking differently: Take 10 mg by mouth daily.  11/20/18  Yes RBrand Males MD  PROAIR HFA 108 ([phone]) MCG/ACT inhaler Inhale 2 puffs into the lungs every 6 (six) hours as needed for wheezing or shortness of breath. 04/11/19  Yes RBrand Males MD   spironolactone (ALDACTONE) 100 MG tablet Take 100 mg by mouth daily.   Yes [provider]  sulfamethoxazole-trimethoprim (BACTRIM) 400-80 MG tablet Take 1 tablet by mouth every Monday, Wednesday, and Friday.   Yes [provider]  thyroid (ARMOUR) 90 MG tablet Take 90 mg by mouth daily before breakfast.    Yes [provider]  Vitamin D, Ergocalciferol, (DRISDOL) 1.25 MG (50000 UT) CAPS capsule TAKE 1 CAPSULE BY MOUTH ONCE WEEKLY Patient taking differently: Take 50,000 Units by mouth every 7 (seven) days.  04/24/18  Yes Beasley, Caren D, MD  benzonatate (TESSALON) 200 MG capsule Take 1 capsule (200 mg total) by mouth 3 (three) times  daily as needed for cough. Patient not taking: Reported on 12/21/2019 09/14/18   Lauraine Rinne, NP  ciprofloxacin (CIPRO) 250 MG tablet Take 1 tablet (250 mg total) by mouth 2 (two) times daily. Patient not taking: Reported on 12/21/2019 11/09/19   Parrett, Fonnie Mu, NP  furosemide (LASIX) 40 MG tablet Take 40 mg by mouth See admin instructions. Take 1 tablet (40 mg) daily, may repeat dose if needed for fluid retention.    [provider]  LORazepam (ATIVAN) 0.5 MG tablet 1/2 - 1 tablet twice daily as needed for anxiety Patient not taking: Reported on 12/21/2019 08/28/19   Martyn Ehrich, NP  Magnesium Oxide 400 (240 Mg) MG TABS TAKE 1 TABLET BY MOUTH EACH DAY Patient not taking: Reported on 12/21/2019 05/31/19   Brand Males, MD  predniSONE (DELTASONE) 10 MG tablet Take 4 tabs po daily x 3 days; then 3 tabs daily x3 days; then 2 tabs daily x3 days; then 1 tab daily x 3 days; then stop Patient not taking: Reported on 12/21/2019 08/28/19   Martyn Ehrich, NP  saccharomyces boulardii (FLORASTOR) 250 MG capsule Take 1 capsule (250 mg total) by mouth 2 (two) times daily. Patient not taking: Reported on 12/21/2019 09/11/19   Brand Males, MD    Allergies    Macrobid [nitrofurantoin macrocrystal] and Adhesive [tape]  Review of  Systems   Review of Systems  Constitutional: Positive for fatigue. Negative for chills and fever.  HENT: Negative for ear pain and sore throat.   Eyes: Negative for pain and visual disturbance.  Respiratory: Negative for cough and shortness of breath.   Cardiovascular: Negative for chest pain and palpitations.  Gastrointestinal: Positive for abdominal pain and nausea. Negative for vomiting.  Genitourinary: Positive for difficulty urinating and dysuria. Negative for hematuria.  Musculoskeletal: Negative for arthralgias and back pain.  Skin: Negative for color change and rash.  Neurological: Negative for syncope and headaches.  All other systems reviewed and are negative.   Physical Exam Updated Vital Signs BP 99/69   Pulse (!) 109   Temp 97.8 F (36.6 C) (Oral)   Resp 20   Ht _0  (1.575 m)   Wt 90.3 kg   SpO2 93%   BMI 36.40 kg/m   Physical Exam Vitals and nursing note reviewed.  Constitutional:      General: She is not in acute distress.    Appearance: She is well-developed.  HENT:     Head: Normocephalic and atraumatic.  Eyes:     Conjunctiva/sclera: Conjunctivae normal.  Cardiovascular:     Rate and Rhythm: Normal rate and regular rhythm.     Pulses: Normal pulses.     Comments: Chest wall port right chest Pulmonary:     Effort: Pulmonary effort is normal. No respiratory distress.     Comments: 95% on 10L [place] Speaking comfortably in full sentences Diffuse crackles bilaterally Abdominal:     General: There is no distension.     Palpations: Abdomen is soft.     Tenderness: There is no abdominal tenderness. There is no guarding.  Musculoskeletal:     Cervical back: Neck supple.  Skin:    General: Skin is warm and dry.  Neurological:     General: No focal deficit present.     Mental Status: She is alert and oriented to person, place, and time.  Psychiatric:        Mood and Affect: Mood normal.        Behavior: Behavior  normal.     ED Results / Procedures  / Treatments   Labs (all labs ordered are listed, but only abnormal results are displayed) Labs Reviewed  URINALYSIS, ROUTINE W REFLEX MICROSCOPIC - Abnormal; Notable for the following components:      Result Value   Color, Urine AMBER (*)    Ketones, ur 5 (*)    Protein, ur 100 (*)    Nitrite POSITIVE (*)    All other components within normal limits  BASIC METABOLIC PANEL - Abnormal; Notable for the following components:   Sodium 131 (*)    Potassium 3.2 (*)    Chloride 82 (*)    Glucose, Bld 159 (*)    Creatinine, Ser 1.05 (*)    Anion gap 20 (*)    All other components within normal limits  URINE CULTURE  RESPIRATORY PANEL BY RT PCR (FLU A&B, COVID)  CULTURE, BLOOD (ROUTINE X 2)  CULTURE, BLOOD (ROUTINE X 2)  CBC  HIV ANTIBODY (ROUTINE TESTING W REFLEX)  COMPREHENSIVE METABOLIC PANEL  MAGNESIUM  CBC WITH DIFFERENTIAL/PLATELET  TSH    EKG None  Radiology No results found.  Procedures Procedures (including critical care time)  Medications Ordered in ED Medications  meropenem (MERREM) 1 g in sodium chloride 0.9 % 100 mL IVPB (1 g Intravenous New Bag/Given 12/21/19 2221)  potassium chloride 10 mEq in 100 mL IVPB (10 mEq Intravenous New Bag/Given 12/21/19 2210)  sulfamethoxazole-trimethoprim (BACTRIM) 400-80 MG per tablet 1 tablet (has no administration in time range)  carvedilol (COREG) tablet 6.25 mg (has no administration in time range)  spironolactone (ALDACTONE) tablet 100 mg (has no administration in time range)  LORazepam (ATIVAN) tablet 0.5 mg (has no administration in time range)  predniSONE (DELTASONE) tablet 10 mg (has no administration in time range)  thyroid (ARMOUR) tablet 90 mg (has no administration in time range)  famotidine (PEPCID) tablet 40 mg (has no administration in time range)  azathioprine (IMURAN) tablet 100 mg (has no administration in time range)  gabapentin (NEURONTIN) capsule 100 mg (100 mg Oral Given 12/21/19 2235)  Pirfenidone TABS 1  tablet (has no administration in time range)  montelukast (SINGULAIR) tablet 10 mg (has no administration in time range)  albuterol (VENTOLIN HFA) 108 (90 Base) MCG/ACT inhaler 2 puff (has no administration in time range)  heparin injection 5,000 Units (5,000 Units Subcutaneous Given 12/21/19 2235)  0.9 %  sodium chloride infusion (0 mLs Intravenous Hold 12/21/19 2223)  acetaminophen (TYLENOL) tablet 650 mg (has no administration in time range)    Or  acetaminophen (TYLENOL) suppository 650 mg (has no administration in time range)  oxyCODONE (Oxy IR/ROXICODONE) immediate release tablet 5 mg (has no administration in time range)  methocarbamol (ROBAXIN) 500 mg in dextrose 5 % 50 mL IVPB (has no administration in time range)  ondansetron (ZOFRAN) tablet 4 mg (has no administration in time range)    Or  ondansetron (ZOFRAN) injection 4 mg (has no administration in time range)  phenazopyridine (PYRIDIUM) tablet 200 mg (has no administration in time range)  sodium chloride 0.9 % bolus 1,000 mL (1,000 mLs Intravenous New Bag/Given 12/21/19 2210)    ED Course  I have reviewed the triage vital signs and the nursing notes.  Pertinent labs & imaging results that were available during my care of the patient were reviewed by me and considered in my medical decision making (see chart for details).  49 yo female presenting to the ED with dysuria in the setting of failed outpatient  treatment for UTI.  She had MDR Klebsiella from 12/18/19 urine culture seen from [facility].    Plan for admission for IV antibiotics - IV meropenem ordered here  Labs show no clear indication of sepsis.  WBC 9.0.   Minor tachycardia without other SIRS criteria.  I've ordered blood cultures, given that she is immunosuppressed.  She does not appear to be in shock.  UA + nitrites - repeat urine culture sent for sensitivities  She remains on her baseline O2 requirement 10L [place].  I personally reviewed her prior medical records and  urine culture results  On reassessment her vitals remained stable for medical admission.  Clinical Course as of Dec 21 2302  Fri Dec 21, 2019  2034 Admitted to hospitalist for MDR UTI   [MT]    Clinical Course User Index [MT] Allie Gerhold, Carola Rhine, MD    Final Clinical Impression(s) / ED Diagnoses Final diagnoses:  Acute cystitis with hematuria    Rx / DC Orders ED Discharge Orders    None       Wyvonnia Dusky, MD 12/21/19 2305

## 2019-12-21 NOTE — H&P (Signed)
TRH H&P    Patient Demographics:    Tasha Ballard, is a 49 y.o. female  MRN: [id number]  DOB - Jul 18, 1970  Admit Date - 12/21/2019  Referring MD/NP/PA: Langston Masker  Outpatient Primary MD for the patient is [name], MD  Patient coming from: Home  Chief complaint- Dysuria   HPI:    Tasha Ballard  is a 49 y.o. female, with history of vitamin D deficiency, thyroid disease, advanced pulmonary fibrosis, hypertension, diabetes mellitus type 2 without complication, and more presents to the ED with a chief complaint of dysuria.  Patient is immunosuppressed since having chemo for pulmonary fibrosis.  She is on prophylactic Bactrim 3 times a week, and prednisone every day.  Her last chemo treatment was 1 year ago.  She still has her right chest port in place as there is now a suspicious nodule on her chest imaging.  Patient reports that since she had chemo a year ago she has started having UTIs.  This is her third 1.  She reports that she called her pulmonologist and was prescribed 5 or 6 days of Cipro.  10 days later she started having dysuria again.  She called her PCP who doubled her Cipro dosage to 500 mg twice daily, and ordered a urine culture.  Urine culture came back and showed multidrug resistance, but should have been sensitive to Augmentin.  Patient was prescribed 10 days of Augmentin.  She reports that that made her constipated and gave her yeast infection.  She was given Diflucan.  She also started experiencing hemorrhoids due to the constipation, and had to use suppositories.  She finished the Augmentin and 5 or 6 days later she started having dysuria again.  She had a repeat urine culture that showed an increase in the Proteus mirabilis from 50,000 to greater than 100,000 colony-forming units, and resistance to all p.o. medications.  Her PCP is a [facility] doctor and called and talked to infectious disease  at [facility] who advised patient going into the ER to be admitted to the hospital for IV antibiotics.  Patient wanted to be at [facility] because her pulmonary doctor is here and if she has any pulmonary complications he is the only when she tries to take care of her.  Patient does admit to associated fever chills.  She reports that she has had severe back pain on the right side at the level of her kidney for several weeks.  She also reports lower abdominal pain.  Both of these pains are worse right before she voids.  Patient is nauseous and reports that her last normal meal was yesterday, but she is not often eating normal meals.  She often forgets to eat, or is nauseous and throws back up whenever she is eating.  Patient is willing to be discharged when IV antibiotics at the infusion center can be set up.  Patient does not smoke, drinks very rarely, does not use illicit drugs. Patient is fully vaccinated for Covid. Patient is full code.  In the ED Temperature 97.8, heart rate  110, respiratory rate 18, blood pressure 136/98, satting at 97% on 10 L nasal cannula UA is not usually indicative of UTI, but urine cultures have showed Proteus mirabilis Meropenem started Admission requested for further management of UTI    Review of systems:    In addition to the HPI above,  Positive for subjective fever chills, No Headache, No changes with Vision or hearing, No problems swallowing food or Liquids, No Chest pain, Cough or increasing shortness of Breath, No Abdominal pain, positive for chronic nausea  bowel movements are regular, No Blood in stool or Urine, Positive for dysuria  No new skin rashes or bruises, No new joints pains-aches,  Positive for generalized weakness, no new tingling, numbness in any extremity, No recent weight gain or loss, No polydypsia or polyphagia, Significant mental stressor- patient was given a prognosis of 5 months to live a year ago  All other systems reviewed and are  negative.    Past History of the following :    Past Medical History:  Diagnosis Date  . Aneurysm ([place])   . Back pain   . Diabetes mellitus without complication ([place])   . H/O blood clots   . Hypertension   . Joint pain   . Pulmonary fibrosis ([place])   . SOB (shortness of breath)   . Swelling   . Thyroid disease   . Vitamin D deficiency       Past Surgical History:  Procedure Laterality Date  . ABLATION    . IR IMAGING GUIDED PORT INSERTION  01/26/2018  . KNEE SURGERY    . LUNG BIOPSY        Social History:      Social History   Tobacco Use  . Smoking status: Never Smoker  . Smokeless tobacco: Never Used  Substance Use Topics  . Alcohol use: Yes    Comment: Occasional       Family History :     Family History  Problem Relation Age of Onset  . Heart failure Mother   . Heart disease Mother   . Rheumatologic disease Father       Home Medications:   Prior to Admission medications   Medication Sig Start Date End Date Taking? Authorizing Provider  azathioprine (IMURAN) 100 MG tablet Take 1 tablet (100 mg total) by mouth daily. 07/05/19  Yes Brand Males, MD  carvedilol (COREG) 6.25 MG tablet Take 6.25 mg by mouth 2 (two) times daily with a meal.   Yes [provider]  ESBRIET 801 MG TABS TAKE 1 TABLET BY MOUTH 3 (THREE) TIMES DAILY WITH MEALS. 07/31/19  Yes Brand Males, MD  famotidine (PEPCID) 40 MG tablet TAKE 1 TABLET BY MOUTH EVERY DAY Patient taking differently: Take 40 mg by mouth daily.  08/16/19  Yes Brand Males, MD  gabapentin (NEURONTIN) 100 MG capsule Take 1 capsule by mouth daily as needed (For pain).  09/06/18  Yes [provider]  metFORMIN (GLUCOPHAGE) 500 MG tablet Take 500 mg by mouth 2 (two) times daily.    Yes [provider]  montelukast (SINGULAIR) 10 MG tablet Take 1 tablet (10 mg total) by mouth daily after supper. 06/28/19  Yes Ramaswamy, Belva Crome, MD  OFEV 150 MG CAPS TAKE 1 CAPSULE TWICE A DAY (12  HOURS APART) WITH FOOD Patient taking differently: Take 150 mg by mouth in the morning and at bedtime.  12/12/19  Yes Brand Males, MD  ondansetron (ZOFRAN) 8 MG tablet TAKE 1 TABLET BY MOUTH EVERY 8  HOURS AS NEEDED FOR NAUSEA AND VOMITING Patient taking differently: Take 8 mg by mouth every 8 (eight) hours as needed for nausea or vomiting.  06/28/19  Yes Brand Males, MD  potassium chloride (KLOR-CON) 10 MEQ tablet Take 3 tablets (30 mEq total) by mouth daily. 09/19/19  Yes Brand Males, MD  predniSONE (DELTASONE) 10 MG tablet Take 10-35m as needed for SOB Patient taking differently: Take 10 mg by mouth daily.  11/20/18  Yes RBrand Males MD  PROAIR HFA 108 ([phone]) MCG/ACT inhaler Inhale 2 puffs into the lungs every 6 (six) hours as needed for wheezing or shortness of breath. 04/11/19  Yes RBrand Males MD  spironolactone (ALDACTONE) 100 MG tablet Take 100 mg by mouth daily.   Yes [provider]  sulfamethoxazole-trimethoprim (BACTRIM) 400-80 MG tablet Take 1 tablet by mouth every Monday, Wednesday, and Friday.   Yes [provider]  thyroid (ARMOUR) 90 MG tablet Take 90 mg by mouth daily before breakfast.    Yes [provider]  Vitamin D, Ergocalciferol, (DRISDOL) 1.25 MG (50000 UT) CAPS capsule TAKE 1 CAPSULE BY MOUTH ONCE WEEKLY Patient taking differently: Take 50,000 Units by mouth every 7 (seven) days.  04/24/18  Yes Beasley, Caren D, MD  benzonatate (TESSALON) 200 MG capsule Take 1 capsule (200 mg total) by mouth 3 (three) times daily as needed for cough. Patient not taking: Reported on 12/21/2019 09/14/18   MLauraine Rinne NP  ciprofloxacin (CIPRO) 250 MG tablet Take 1 tablet (250 mg total) by mouth 2 (two) times daily. Patient not taking: Reported on 12/21/2019 11/09/19   Parrett, TFonnie Mu NP  furosemide (LASIX) 40 MG tablet Take 40 mg by mouth See admin instructions. Take 1 tablet (40 mg) daily, may repeat dose if needed for fluid retention.     [provider]  LORazepam (ATIVAN) 0.5 MG tablet 1/2 - 1 tablet twice daily as needed for anxiety Patient not taking: Reported on 12/21/2019 08/28/19   WMartyn Ehrich NP  Magnesium Oxide 400 (240 Mg) MG TABS TAKE 1 TABLET BY MOUTH EACH DAY Patient not taking: Reported on 12/21/2019 05/31/19   RBrand Males MD  predniSONE (DELTASONE) 10 MG tablet Take 4 tabs po daily x 3 days; then 3 tabs daily x3 days; then 2 tabs daily x3 days; then 1 tab daily x 3 days; then stop Patient not taking: Reported on 12/21/2019 08/28/19   WMartyn Ehrich NP  saccharomyces boulardii (FLORASTOR) 250 MG capsule Take 1 capsule (250 mg total) by mouth 2 (two) times daily. Patient not taking: Reported on 12/21/2019 09/11/19   RBrand Males MD     Allergies:     Allergies  Allergen Reactions  . Macrobid [Nitrofurantoin Macrocrystal] Nausea And Vomiting  . Adhesive [Tape] Rash     Physical Exam:   Vitals  Blood pressure (!) 143/106, pulse (!) 109, temperature 97.8 F (36.6 C), temperature source Oral, resp. rate 18, height 5' 2" (1.575 m), weight 90.3 kg, SpO2 93 %.  1.  General: Lying supine in bed in no acute distress  2. Psychiatric: Mood and behavior normal for situation Seems to have good insight regarding healthcare  3. Neurologic: Cranial nerves II through XII are grossly intact, moves all 4 extremities voluntarily, no focal deficit on limited exam, alert and oriented x3  4. HEENMT:  Head is atraumatic, normocephalic, pupils reactive to light, neck is supple, trachea is midline, mucous membranes are mildly dry  5. Respiratory : Velcro-like breath sounds in the  lower lung fields bilaterally, no rhonchi, no wheeze, no clubbing  6. Cardiovascular : Heart rate is tachycardic, rhythm is regular, no murmurs rubs or gallops  7. Gastrointestinal:  Abdomen is soft, nondistended, nontender to palpation  8. Skin:  No acute lesion on limited skin exam  9.Musculoskeletal:  No  peripheral edema or acute deformity    Data Review:    CBC Recent Labs  Lab 12/21/19 1908  WBC 9.0  HGB 14.1  HCT 43.3  PLT 294  MCV 96.0  MCH 31.3  MCHC 32.6  RDW 14.1   ------------------------------------------------------------------------------------------------------------------  Results for orders placed or performed during the hospital encounter of 12/21/19 (from the past 48 hour(s))  Urinalysis, Routine w reflex microscopic Urine, Clean Catch     Status: Abnormal   Collection Time: 12/21/19  6:42 PM  Result Value Ref Range   Color, Urine AMBER (A) YELLOW    Comment: BIOCHEMICALS MAY BE AFFECTED BY COLOR   APPearance CLEAR CLEAR   Specific Gravity, Urine 1.009 1.005 - 1.030   pH 7.0 5.0 - 8.0   Glucose, UA NEGATIVE NEGATIVE mg/dL   Hgb urine dipstick NEGATIVE NEGATIVE   Bilirubin Urine NEGATIVE NEGATIVE   Ketones, ur 5 (A) NEGATIVE mg/dL   Protein, ur 100 (A) NEGATIVE mg/dL   Nitrite POSITIVE (A) NEGATIVE   Leukocytes,Ua NEGATIVE NEGATIVE   RBC / HPF 0-5 0 - 5 RBC/hpf   WBC, UA 0-5 0 - 5 WBC/hpf   Bacteria, UA NONE SEEN NONE SEEN   Squamous Epithelial / LPF 0-5 0 - 5   Mucus PRESENT    Hyaline Casts, UA PRESENT     Comment: Performed at [facility], [place] [address] [place], [place] [address]  Basic metabolic panel     Status: Abnormal   Collection Time: 12/21/19  7:08 PM  Result Value Ref Range   Sodium 131 (L) 135 - 145 mmol/L   Potassium 3.2 (L) 3.5 - 5.1 mmol/L   Chloride 82 (L) 98 - 111 mmol/L   CO2 29 22 - 32 mmol/L   Glucose, Bld 159 (H) 70 - 99 mg/dL    Comment: Glucose reference range applies only to samples taken after fasting for at least 8 hours.   BUN 10 6 - 20 mg/dL   Creatinine, Ser 1.05 (H) 0.44 - 1.00 mg/dL   Calcium 9.9 8.9 - 10.3 mg/dL   GFR, Estimated >60 >60 mL/min    Comment: (NOTE) Calculated using the CKD-EPI Creatinine Equation (2021)    Anion gap 20 (H) 5 - 15    Comment: Performed at [facility], [place] [address], [place], [place] [address]  CBC     Status: None   Collection Time: 12/21/19  7:08 PM  Result Value Ref Range   WBC 9.0 4.0 - 10.5 K/uL   RBC 4.51 3.87 - 5.11 MIL/uL   Hemoglobin 14.1 12.0 - 15.0 g/dL   HCT 43.3 36 - 46 %   MCV 96.0 80.0 - 100.0 fL   MCH 31.3 26.0 - 34.0 pg   MCHC 32.6 30.0 - 36.0 g/dL   RDW 14.1 11.5 - 15.5 %   Platelets 294 150 - 400 K/uL   nRBC 0.0 0.0 - 0.2 %    Comment: Performed at [facility], [place] [address] [place], [place] [address]    Chemistries  Recent Labs  Lab 12/21/19 1908  NA 131*  K 3.2*  CL 82*  CO2 29  GLUCOSE  159*  BUN 10  CREATININE 1.05*  CALCIUM 9.9   ------------------------------------------------------------------------------------------------------------------  ------------------------------------------------------------------------------------------------------------------ GFR: Estimated Creatinine Clearance: 66.2 mL/min (A) (by C-G formula based on SCr of 1.05 mg/dL (H)). Liver Function Tests: No results for input(s): AST, ALT, ALKPHOS, BILITOT, PROT, ALBUMIN in the last 168 hours. No results for input(s): LIPASE, AMYLASE in the last 168 hours. No results for input(s): AMMONIA in the last 168 hours. Coagulation Profile: No results for input(s): INR, PROTIME in the last 168 hours. Cardiac Enzymes: No results for input(s): CKTOTAL, CKMB, CKMBINDEX, TROPONINI in the last 168 hours. BNP (last 3 results) Recent Labs    02/08/19 1029  PROBNP 32.0   HbA1C: No results for input(s): HGBA1C in the last 72 hours. CBG: No results for input(s): GLUCAP in the last 168 hours. Lipid Profile: No results for input(s): CHOL, HDL, LDLCALC, TRIG, CHOLHDL, LDLDIRECT in the last 72 hours. Thyroid Function Tests: No results for input(s): TSH, T4TOTAL, FREET4, T3FREE, THYROIDAB in the last 72 hours. Anemia Panel: No results for input(s): VITAMINB12, FOLATE, FERRITIN, TIBC, IRON,  RETICCTPCT in the last 72 hours.  --------------------------------------------------------------------------------------------------------------- Urine analysis:    Component Value Date/Time   COLORURINE AMBER (A) 12/21/2019 1842   APPEARANCEUR CLEAR 12/21/2019 1842   LABSPEC 1.009 12/21/2019 1842   PHURINE 7.0 12/21/2019 1842   GLUCOSEU NEGATIVE 12/21/2019 1842   GLUCOSEU NEGATIVE 05/15/2019 1040   HGBUR NEGATIVE 12/21/2019 1842   BILIRUBINUR NEGATIVE 12/21/2019 1842   KETONESUR 5 (A) 12/21/2019 1842   PROTEINUR 100 (A) 12/21/2019 1842   UROBILINOGEN 0.2 05/15/2019 1040   NITRITE POSITIVE (A) 12/21/2019 1842   LEUKOCYTESUR NEGATIVE 12/21/2019 1842      Imaging Results:    No results found.     Assessment & Plan:    Active Problems:   UTI (urinary tract infection)   1. Complicated UTI 1. Failed a course of Cipro 250 mg twice daily, a course of Cipro 500 mg twice daily, and a course of Augmentin outpatient 2. Culture done at [facility] reveals multidrug-resistant Proteus mirabilis 3. PCP discussed with ID at [facility] to determine patient should go into the hospital for IV antibiotics, patient's pulmonary doctors here and so she wanted to be admitted to [place] 4. Continue meropenem -per pharmacy consult 5. Urine culture pending 6. Patient does not meet sepsis criteria she does have a tachycardia with a heart rate of 110 but her respiratory rate is normal blood pressure is normal Husted blood cell count is normal, temperature is normal 7. Continue to monitor 2. Hypokalemia 1. Replace and recheck 2. Secondary to poor p.o. intake secondary to nausea 3. AKI 1. At in baseline 0.75, today 1.05 2. 1 L bolus in the ED 3. Continue gentle hydration 4. Trend in the a.m. 4. Thyroid disease 1. Continue armour 2. Check TSH 5. Hyponatremia/hypochloremia 1. Secondary to poor p.o. intake secondary to nausea 2. Fluids as above 3. Trend in the a.m. 6. Pulmonary  fibrosis 1. Patient on 10 L nasal cannula at baseline 2. Reports that she can tolerate 5 L nasal cannula at rest, but she does need 8 to 10 L with exertion 3. Continue home medications    DVT Prophylaxis-   Heparin- SCDs   AM Labs Ordered, also please review Full Orders  Family Communication: Admission, patients condition and plan of care including tests being ordered have been discussed with the patient and husband who indicate understanding and agree with the plan and Code Status.  Code Status:  Full  Admission status: Observation    The patient's presenting symptoms include back pain and dysuria The initial radiographic and laboratory data are worrisome because of urine culture in Care Everywhere shows multidrug-resistant Proteus mirabilis The chronic co-morbidities include thyroid disease, pulmonary fibrosis, hypertension, diabetes mellitus without complication only on Metformin    Time spent in minutes : 12   Jenness Stemler B Zierle-Ghosh DO

## 2019-12-22 ENCOUNTER — Other Ambulatory Visit: Payer: Self-pay

## 2019-12-22 ENCOUNTER — Inpatient Hospital Stay (HOSPITAL_COMMUNITY): Payer: BC Managed Care – PPO

## 2019-12-22 ENCOUNTER — Encounter (HOSPITAL_COMMUNITY): Payer: Self-pay | Admitting: Family Medicine

## 2019-12-22 DIAGNOSIS — J9611 Chronic respiratory failure with hypoxia: Secondary | ICD-10-CM

## 2019-12-22 DIAGNOSIS — D849 Immunodeficiency, unspecified: Secondary | ICD-10-CM | POA: Diagnosis present

## 2019-12-22 DIAGNOSIS — E876 Hypokalemia: Secondary | ICD-10-CM | POA: Diagnosis present

## 2019-12-22 DIAGNOSIS — B964 Proteus (mirabilis) (morganii) as the cause of diseases classified elsewhere: Secondary | ICD-10-CM | POA: Diagnosis present

## 2019-12-22 DIAGNOSIS — J8410 Pulmonary fibrosis, unspecified: Secondary | ICD-10-CM | POA: Diagnosis present

## 2019-12-22 DIAGNOSIS — I10 Essential (primary) hypertension: Secondary | ICD-10-CM | POA: Diagnosis present

## 2019-12-22 DIAGNOSIS — N179 Acute kidney failure, unspecified: Secondary | ICD-10-CM | POA: Diagnosis present

## 2019-12-22 DIAGNOSIS — E538 Deficiency of other specified B group vitamins: Secondary | ICD-10-CM | POA: Diagnosis present

## 2019-12-22 DIAGNOSIS — D7589 Other specified diseases of blood and blood-forming organs: Secondary | ICD-10-CM | POA: Diagnosis present

## 2019-12-22 DIAGNOSIS — R197 Diarrhea, unspecified: Secondary | ICD-10-CM | POA: Diagnosis not present

## 2019-12-22 DIAGNOSIS — Z20822 Contact with and (suspected) exposure to covid-19: Secondary | ICD-10-CM | POA: Diagnosis present

## 2019-12-22 DIAGNOSIS — D638 Anemia in other chronic diseases classified elsewhere: Secondary | ICD-10-CM | POA: Diagnosis present

## 2019-12-22 DIAGNOSIS — Z87440 Personal history of urinary (tract) infections: Secondary | ICD-10-CM | POA: Diagnosis not present

## 2019-12-22 DIAGNOSIS — Z9981 Dependence on supplemental oxygen: Secondary | ICD-10-CM | POA: Diagnosis not present

## 2019-12-22 DIAGNOSIS — N3001 Acute cystitis with hematuria: Secondary | ICD-10-CM | POA: Diagnosis present

## 2019-12-22 DIAGNOSIS — K3580 Unspecified acute appendicitis: Secondary | ICD-10-CM | POA: Diagnosis present

## 2019-12-22 DIAGNOSIS — J84111 Idiopathic interstitial pneumonia, not otherwise specified: Secondary | ICD-10-CM

## 2019-12-22 DIAGNOSIS — Z23 Encounter for immunization: Secondary | ICD-10-CM | POA: Diagnosis not present

## 2019-12-22 DIAGNOSIS — R300 Dysuria: Secondary | ICD-10-CM | POA: Diagnosis present

## 2019-12-22 DIAGNOSIS — K649 Unspecified hemorrhoids: Secondary | ICD-10-CM | POA: Diagnosis present

## 2019-12-22 DIAGNOSIS — B372 Candidiasis of skin and nail: Secondary | ICD-10-CM | POA: Diagnosis present

## 2019-12-22 DIAGNOSIS — E878 Other disorders of electrolyte and fluid balance, not elsewhere classified: Secondary | ICD-10-CM | POA: Diagnosis present

## 2019-12-22 DIAGNOSIS — N390 Urinary tract infection, site not specified: Secondary | ICD-10-CM | POA: Diagnosis not present

## 2019-12-22 DIAGNOSIS — K5900 Constipation, unspecified: Secondary | ICD-10-CM | POA: Diagnosis present

## 2019-12-22 DIAGNOSIS — E039 Hypothyroidism, unspecified: Secondary | ICD-10-CM | POA: Diagnosis present

## 2019-12-22 DIAGNOSIS — E871 Hypo-osmolality and hyponatremia: Secondary | ICD-10-CM | POA: Diagnosis present

## 2019-12-22 DIAGNOSIS — Z1624 Resistance to multiple antibiotics: Secondary | ICD-10-CM | POA: Diagnosis present

## 2019-12-22 DIAGNOSIS — R1031 Right lower quadrant pain: Secondary | ICD-10-CM | POA: Diagnosis not present

## 2019-12-22 DIAGNOSIS — R1030 Lower abdominal pain, unspecified: Secondary | ICD-10-CM | POA: Diagnosis not present

## 2019-12-22 DIAGNOSIS — E119 Type 2 diabetes mellitus without complications: Secondary | ICD-10-CM | POA: Diagnosis present

## 2019-12-22 LAB — COMPREHENSIVE METABOLIC PANEL
ALT: 16 U/L (ref 0–44)
AST: 17 U/L (ref 15–41)
Albumin: 3.2 g/dL — ABNORMAL LOW (ref 3.5–5.0)
Alkaline Phosphatase: 35 U/L — ABNORMAL LOW (ref 38–126)
Anion gap: 14 (ref 5–15)
BUN: 11 mg/dL (ref 6–20)
CO2: 32 mmol/L (ref 22–32)
Calcium: 9 mg/dL (ref 8.9–10.3)
Chloride: 88 mmol/L — ABNORMAL LOW (ref 98–111)
Creatinine, Ser: 0.87 mg/dL (ref 0.44–1.00)
GFR, Estimated: >60 mL/min (ref >60)
Glucose, Bld: 81 mg/dL (ref 70–99)
Potassium: 3.2 mmol/L — ABNORMAL LOW (ref 3.5–5.1)
Sodium: 134 mmol/L — ABNORMAL LOW (ref 135–145)
Total Bilirubin: 0.8 mg/dL (ref 0.3–1.2)
Total Protein: 5.6 g/dL — ABNORMAL LOW (ref 6.5–8.1)

## 2019-12-22 LAB — MAGNESIUM: Magnesium: 1.8 mg/dL (ref 1.7–2.4)

## 2019-12-22 LAB — CBC WITH DIFFERENTIAL/PLATELET
Abs Immature Granulocytes: 0.05 10*3/uL (ref 0.00–0.07)
Basophils Absolute: 0 10*3/uL (ref 0.0–0.1)
Basophils Relative: 0 %
Eosinophils Absolute: 0 10*3/uL (ref 0.0–0.5)
Eosinophils Relative: 0 %
HCT: 35.6 % — ABNORMAL LOW (ref 36.0–46.0)
Hemoglobin: 11.5 g/dL — ABNORMAL LOW (ref 12.0–15.0)
Immature Granulocytes: 1 %
Lymphocytes Relative: 7 %
Lymphs Abs: 0.4 10*3/uL — ABNORMAL LOW (ref 0.7–4.0)
MCH: 32.1 pg (ref 26.0–34.0)
MCHC: 32.3 g/dL (ref 30.0–36.0)
MCV: 99.4 fL (ref 80.0–100.0)
Monocytes Absolute: 0.7 10*3/uL (ref 0.1–1.0)
Monocytes Relative: 13 %
Neutro Abs: 4.3 10*3/uL (ref 1.7–7.7)
Neutrophils Relative %: 79 %
Platelets: 223 10*3/uL (ref 150–400)
RBC: 3.58 MIL/uL — ABNORMAL LOW (ref 3.87–5.11)
RDW: 14.3 % (ref 11.5–15.5)
WBC: 5.5 10*3/uL (ref 4.0–10.5)
nRBC: 0 % (ref 0.0–0.2)

## 2019-12-22 LAB — RESPIRATORY PANEL BY RT PCR (FLU A&B, COVID)
Influenza A by PCR: NEGATIVE
Influenza B by PCR: NEGATIVE
SARS Coronavirus 2 by RT PCR: NEGATIVE

## 2019-12-22 LAB — TSH: TSH: 0.742 u[IU]/mL (ref 0.350–4.500)

## 2019-12-22 LAB — HIV ANTIBODY (ROUTINE TESTING W REFLEX): HIV Screen 4th Generation wRfx: NONREACTIVE

## 2019-12-22 LAB — GLUCOSE, CAPILLARY
Glucose-Capillary: 182 mg/dL — ABNORMAL HIGH (ref 70–99)
Glucose-Capillary: 130 mg/dL — ABNORMAL HIGH (ref 70–99)
Glucose-Capillary: 149 mg/dL — ABNORMAL HIGH (ref 70–99)

## 2019-12-22 MED ORDER — IOHEXOL 300 MG/ML  SOLN
100.0000 mL | Freq: Once | INTRAMUSCULAR | Status: AC | PRN
  Administered 2019-12-22: 100 mL via INTRAVENOUS

## 2019-12-22 MED ORDER — SODIUM CHLORIDE 0.9% FLUSH: 10.0000 mL | INTRAVENOUS | Status: DC | PRN

## 2019-12-22 MED ORDER — NINTEDANIB ESYLATE 150 MG PO CAPS
150.0000 mg | ORAL_CAPSULE | Freq: Two times a day (BID) | ORAL | Status: DC
  Administered 2019-12-22 – 2019-12-28 (×13): 150 mg via ORAL

## 2019-12-22 MED ORDER — IOHEXOL 12 MG/ML PO SOLN
500.0000 mL | ORAL | Status: AC
  Administered 2019-12-22 (×2): 500 mL via ORAL

## 2019-12-22 MED ORDER — CHLORHEXIDINE GLUCONATE CLOTH 2 % EX PADS
6.0000 | MEDICATED_PAD | Freq: Every day | CUTANEOUS | Status: DC
  Administered 2019-12-22 – 2019-12-28 (×7): 6 via TOPICAL

## 2019-12-22 MED ORDER — SODIUM CHLORIDE 0.9% FLUSH: 10.0000 mL | Freq: Two times a day (BID) | INTRAVENOUS | Status: DC

## 2019-12-22 NOTE — Assessment & Plan Note (Signed)
-  Follows closely with Dr. [name] -Continue Ofev and Esbriet -Continue chronic prednisone 10 mg daily; no indication for stress dosing at this time but will continue to monitor in setting of infection

## 2019-12-22 NOTE — Assessment & Plan Note (Signed)
-  Continue Armour Thyroid

## 2019-12-22 NOTE — Assessment & Plan Note (Signed)
-  Replete and recheck as needed

## 2019-12-22 NOTE — Assessment & Plan Note (Addendum)
-  patient presented with chills, fatigue, flank/pelvic, and CVA tenderness - UCx growing Proteus ESBL. The MIC was considered too high for Augmentin outpatient, I suspect this is why she did not respond well. Symptoms now have continued for approximately 3 weeks and she is s/p 3 courses of abx with increased colony count on last 2 urine cultures, suggesting untreated infection - now on meropenem; continue this for likely 7 day total course (likely can change to Ertapenem at discharge for daily dosing if able to arrange H/H or infusion center) - admission UCx "<10k colonies" - repeat UA on 11/1 for clearance - CT abd/pelvis to evaluate for abscess and/or pyelo: negative for abscess or pyelo on CT

## 2019-12-22 NOTE — Progress Notes (Signed)
PROGRESS NOTE    Tasha Ballard   IRJ:[id number]  DOB: 1971-02-12  DOA: 12/21/2019     0  PCP: [name], MD  CC: dysuria  Hospital Course: Tasha Ballard  is a 49 y.o. female, with history of vitamin D deficiency, thyroid disease, advanced pulmonary fibrosis, hypertension, diabetes mellitus type 2 without complication who presented to the ED with a chief complaint of dysuria.    Patient is immunosuppressed since having chemo for pulmonary fibrosis.  She is on prophylactic Bactrim 3 times a week, and prednisone daily.  Her last chemo treatment was 1 year ago.  She has a right chest port in place as there is now a suspicious nodule on her chest imaging.  Patient reports that since she had chemo a year ago she has started having UTIs.  This is her third episode.   She reports that she called her pulmonologist and was prescribed 5 or 6 days of Cipro.  10 days later she started having dysuria again.  She called her PCP who doubled her Cipro dosage to 500 mg twice daily, and ordered a urine culture.  Urine culture came back and showed ESBL Proteus possible sens to Augmentin. She was prescribed 10 days of Augmentin.  She reports that that made her constipated and gave her yeast infection.  She was then given Diflucan.  She also started experiencing hemorrhoids due to the constipation, and had to use suppositories.  She finished the Augmentin and 5 or 6 days later she started having dysuria again.   She had a repeat urine culture that showed an increase in the Proteus mirabilis from 50,000 to greater than 100,000 colony-forming units.   Patient does admit to associated fevers and chills however also describes some perimenopausal symptoms.  She reports that she has had severe back pain on the right side at the level of her kidney for several weeks.  She also reports lower abdominal pain.  Both of these pains are worse right before she voids.  Patient is nauseous and reports that her last  normal meal was yesterday, but she is not often eating normal meals.  She often forgets to eat, or is nauseous and throws back up whenever she is eating.   In the ED Temperature 97.8, heart rate 110, respiratory rate 18, blood pressure 136/98, satting at 97% on 10 L nasal cannula UA notable for positive nitrites, negative leukocyte esterase, and no bacteria seen; 0-5 WBC.  She was started on meropenem and admitted for further treatment and monitoring.   Interval History:  Seen this morning with husband bedside.  She confirms history given on admission.  Biggest symptom seems to be her ongoing dysuria but she has been having bilateral lower back pain which raises concern for infection to kidneys.  Old records reviewed in assessment of this patient  ROS: Constitutional: positive for chills, fatigue, fevers and malaise, Respiratory: negative for cough, Cardiovascular: negative for chest pain and Gastrointestinal: negative for abdominal pain  Assessment & Plan: * Complicated UTI (urinary tract infection) - patient presents with chills, fatigue, flank/pelvic, and CVA tenderness - UCx growing Proteus ESBL. The MIC seems too high for Augmentin outpatient, I suspect this is why she did not respond well. Symptoms now have continued for approximately 3 weeks and she is s/p 3 courses of abx with increased colony count on last 2 urine cultures, suggesting untreated infection - now on meropenem; continue this for likely 7 day total course (likely can change to Ertapenem at  discharge for daily dosing if able to arrange H/H or infusion center) - needs repeat Urine specimen after ~48 hours for clearance - obtain CT abd/pelvis to evaluate for abscess and/or pyelo   Chronic respiratory failure with hypoxia ([place]) -Patient on 10 L nasal cannula at baseline -Continue O2  Idiopathic interstitial pneumonia (HCC) -Follows closely with Dr. [name] -Continue Ofev and Esbriet -Continue chronic prednisone 10 mg  daily; no indication for stress dosing at this time but will continue to monitor in setting of infection  Hypokalemia -Replete and recheck as needed  Hypothyroidism -Continue [place] Thyroid   Antimicrobials: Meropenem 12/21/2019>> present  DVT prophylaxis: HSQ Code Status: Full Family Communication: Husband bedside Disposition Plan: Status is: Observation  The patient will require care spanning > 2 midnights and should be moved to inpatient because: Ongoing diagnostic testing needed not appropriate for outpatient work up, Unsafe d/c plan, IV treatments appropriate due to intensity of illness or inability to take PO and Inpatient level of care appropriate due to severity of illness  Dispo: The patient is from: Home              Anticipated d/c is to: Home              Anticipated d/c date is: 2 days              Patient currently is not medically stable to d/c.       Objective: Blood pressure 120/81, pulse 89, temperature (!) 97.5 F (36.4 C), temperature source Oral, resp. rate 20, height 5' 2.01" (1.575 m), weight 90.3 kg, SpO2 100 %.  Examination: General appearance: alert, cooperative and no distress Head: Normocephalic, without obvious abnormality, atraumatic Eyes: EOMI Lungs: clear to auscultation bilaterally  Chest: Right-sided port in place Heart: regular rate and rhythm and S1, S2 normal  Back: Bilateral CVA tenderness Abdomen: normal findings: bowel sounds normal and soft, non-tender Extremities: No edema Skin: mobility and turgor normal Neurologic: Grossly normal  Consultants:   None  Procedures:   None  Data Reviewed: I have personally reviewed following labs and imaging studies Results for orders placed or performed during the hospital encounter of 12/21/19 (from the past 24 hour(s))  Urinalysis, Routine w reflex microscopic Urine, Clean Catch     Status: Abnormal   Collection Time: 12/21/19  6:42 PM  Result Value Ref Range   Color, Urine AMBER  (A) YELLOW   APPearance CLEAR CLEAR   Specific Gravity, Urine 1.009 1.005 - 1.030   pH 7.0 5.0 - 8.0   Glucose, UA NEGATIVE NEGATIVE mg/dL   Hgb urine dipstick NEGATIVE NEGATIVE   Bilirubin Urine NEGATIVE NEGATIVE   Ketones, ur 5 (A) NEGATIVE mg/dL   Protein, ur 100 (A) NEGATIVE mg/dL   Nitrite POSITIVE (A) NEGATIVE   Leukocytes,Ua NEGATIVE NEGATIVE   RBC / HPF 0-5 0 - 5 RBC/hpf   WBC, UA 0-5 0 - 5 WBC/hpf   Bacteria, UA NONE SEEN NONE SEEN   Squamous Epithelial / LPF 0-5 0 - 5   Mucus PRESENT    Hyaline Casts, UA PRESENT   Basic metabolic panel     Status: Abnormal   Collection Time: 12/21/19  7:08 PM  Result Value Ref Range   Sodium 131 (L) 135 - 145 mmol/L   Potassium 3.2 (L) 3.5 - 5.1 mmol/L   Chloride 82 (L) 98 - 111 mmol/L   CO2 29 22 - 32 mmol/L   Glucose, Bld 159 (H) 70 - 99 mg/dL  BUN 10 6 - 20 mg/dL   Creatinine, Ser 1.05 (H) 0.44 - 1.00 mg/dL   Calcium 9.9 8.9 - 10.3 mg/dL   GFR, Estimated >60 >60 mL/min   Anion gap 20 (H) 5 - 15  CBC     Status: None   Collection Time: 12/21/19  7:08 PM  Result Value Ref Range   WBC 9.0 4.0 - 10.5 K/uL   RBC 4.51 3.87 - 5.11 MIL/uL   Hemoglobin 14.1 12.0 - 15.0 g/dL   HCT 43.3 36 - 46 %   MCV 96.0 80.0 - 100.0 fL   MCH 31.3 26.0 - 34.0 pg   MCHC 32.6 30.0 - 36.0 g/dL   RDW 14.1 11.5 - 15.5 %   Platelets 294 150 - 400 K/uL   nRBC 0.0 0.0 - 0.2 %  Blood culture (routine x 2)     Status: None (Preliminary result)   Collection Time: 12/21/19  9:40 PM   Specimen: BLOOD  Result Value Ref Range   Specimen Description      BLOOD LEFT ANTECUBITAL Performed at [facility], [place] [address], [place], [place] [address]    Special Requests      BOTTLES DRAWN AEROBIC AND ANAEROBIC Blood Culture adequate volume Performed at [facility] [address] [place], [place] [address]    Culture      NO GROWTH < 12 HOURS Performed at [facility] [address] [place], [place] [address]     Report Status PENDING   Respiratory Panel by RT PCR (Flu A&B, Covid) - Nasopharyngeal Swab     Status: None   Collection Time: 12/21/19  9:50 PM   Specimen: Nasopharyngeal Swab  Result Value Ref Range   SARS Coronavirus 2 by RT PCR NEGATIVE NEGATIVE   Influenza A by PCR NEGATIVE NEGATIVE   Influenza B by PCR NEGATIVE NEGATIVE  Blood culture (routine x 2)     Status: None (Preliminary result)   Collection Time: 12/21/19  9:50 PM   Specimen: BLOOD  Result Value Ref Range   Specimen Description      BLOOD RIGHT ANTECUBITAL Performed at [facility], [place] [address], [place], [place] [address]    Special Requests      BOTTLES DRAWN AEROBIC AND ANAEROBIC Blood Culture adequate volume Performed at [facility] [address], [place], [place] [address]    Culture      NO GROWTH < 12 HOURS Performed at [facility] [address] [place], [place] [address]    Report Status PENDING   HIV Antibody (routine testing w rflx)     Status: None   Collection Time: 12/22/19  3:15 AM  Result Value Ref Range   HIV Screen 4th Generation wRfx Non Reactive Non Reactive  Comprehensive metabolic panel     Status: Abnormal   Collection Time: 12/22/19  3:15 AM  Result Value Ref Range   Sodium 134 (L) 135 - 145 mmol/L   Potassium 3.2 (L) 3.5 - 5.1 mmol/L   Chloride 88 (L) 98 - 111 mmol/L   CO2 32 22 - 32 mmol/L   Glucose, Bld 81 70 - 99 mg/dL   BUN 11 6 - 20 mg/dL   Creatinine, Ser 0.87 0.44 - 1.00 mg/dL   Calcium 9.0 8.9 - 10.3 mg/dL   Total Protein 5.6 (L) 6.5 - 8.1 g/dL   Albumin 3.2 (L) 3.5 - 5.0 g/dL   AST 17  15 - 41 U/L   ALT 16 0 - 44 U/L   Alkaline Phosphatase 35 (L) 38 - 126 U/L   Total Bilirubin 0.8 0.3 - 1.2 mg/dL   GFR, Estimated >60 >60 mL/min   Anion gap 14 5 - 15  Magnesium     Status: None   Collection Time: 12/22/19  3:15 AM  Result Value Ref Range   Magnesium 1.8 1.7 - 2.4 mg/dL  CBC WITH DIFFERENTIAL     Status: Abnormal    Collection Time: 12/22/19  3:15 AM  Result Value Ref Range   WBC 5.5 4.0 - 10.5 K/uL   RBC 3.58 (L) 3.87 - 5.11 MIL/uL   Hemoglobin 11.5 (L) 12.0 - 15.0 g/dL   HCT 35.6 (L) 36 - 46 %   MCV 99.4 80.0 - 100.0 fL   MCH 32.1 26.0 - 34.0 pg   MCHC 32.3 30.0 - 36.0 g/dL   RDW 14.3 11.5 - 15.5 %   Platelets 223 150 - 400 K/uL   nRBC 0.0 0.0 - 0.2 %   Neutrophils Relative % 79 %   Neutro Abs 4.3 1.7 - 7.7 K/uL   Lymphocytes Relative 7 %   Lymphs Abs 0.4 (L) 0.7 - 4.0 K/uL   Monocytes Relative 13 %   Monocytes Absolute 0.7 0.1 - 1.0 K/uL   Eosinophils Relative 0 %   Eosinophils Absolute 0.0 0.0 - 0.5 K/uL   Basophils Relative 0 %   Basophils Absolute 0.0 0.0 - 0.1 K/uL   Immature Granulocytes 1 %   Abs Immature Granulocytes 0.05 0.00 - 0.07 K/uL  TSH     Status: None   Collection Time: 12/22/19  3:15 AM  Result Value Ref Range   TSH 0.742 0.350 - 4.500 uIU/mL  Glucose, capillary     Status: Abnormal   Collection Time: 12/22/19  8:03 AM  Result Value Ref Range   Glucose-Capillary 182 (H) 70 - 99 mg/dL  Glucose, capillary     Status: Abnormal   Collection Time: 12/22/19 12:13 PM  Result Value Ref Range   Glucose-Capillary 130 (H) 70 - 99 mg/dL    Recent Results (from the past 240 hour(s))  Blood culture (routine x 2)     Status: None (Preliminary result)   Collection Time: 12/21/19  9:40 PM   Specimen: BLOOD  Result Value Ref Range Status   Specimen Description   Final    BLOOD LEFT ANTECUBITAL Performed at [facility], [place] [address] [place], [place] [address]    Special Requests   Final    BOTTLES DRAWN AEROBIC AND ANAEROBIC Blood Culture adequate volume Performed at [facility] [address], [place], [place] [address]    Culture   Final    NO GROWTH < 12 HOURS Performed at [facility] [address], [place], [place] [address]    Report Status PENDING  Incomplete  Respiratory Panel by RT PCR (Flu A&B, Covid) -  Nasopharyngeal Swab     Status: None   Collection Time: 12/21/19  9:50 PM   Specimen: Nasopharyngeal Swab  Result Value Ref Range Status   SARS Coronavirus 2 by RT PCR NEGATIVE NEGATIVE Final    Comment: (NOTE) SARS-CoV-2 target nucleic acids are NOT DETECTED.  The SARS-CoV-2 RNA is generally detectable in upper respiratoy specimens during the acute phase of infection. The lowest concentration of SARS-CoV-2 viral copies this assay can detect is 131 copies/mL. A negative result does not preclude  SARS-Cov-2 infection and should not be used as the sole basis for treatment or other patient management decisions. A negative result may occur with  improper specimen collection/handling, submission of specimen other than nasopharyngeal swab, presence of viral mutation(s) within the areas targeted by this assay, and inadequate number of viral copies (<131 copies/mL). A negative result must be combined with clinical observations, patient history, and epidemiological information. The expected result is Negative.  Fact Sheet for Patients:  PinkCheek.be  Fact Sheet for Healthcare Providers:  GravelBags.it  This test is no t yet approved or cleared by the [place] FDA and  has been authorized for detection and/or diagnosis of SARS-CoV-2 by FDA under an Emergency Use Authorization (EUA). This EUA will remain  in effect (meaning this test can be used) for the duration of the COVID-19 declaration under Section 564(b)(1) of the Act, 21 U.S.C. section 360bbb-3(b)(1), unless the authorization is terminated or revoked sooner.     Influenza A by PCR NEGATIVE NEGATIVE Final   Influenza B by PCR NEGATIVE NEGATIVE Final    Comment: (NOTE) The Xpert Xpress SARS-CoV-2/FLU/RSV assay is intended as an aid in  the diagnosis of influenza from Nasopharyngeal swab specimens and  should not be used as a sole basis for treatment. Nasal washings and   aspirates are unacceptable for Xpert Xpress SARS-CoV-2/FLU/RSV  testing.  Fact Sheet for Patients: PinkCheek.be  Fact Sheet for Healthcare Providers: GravelBags.it  This test is not yet approved or cleared by the [place] FDA and  has been authorized for detection and/or diagnosis of SARS-CoV-2 by  FDA under an Emergency Use Authorization (EUA). This EUA will remain  in effect (meaning this test can be used) for the duration of the  Covid-19 declaration under Section 564(b)(1) of the Act, 21  U.S.C. section 360bbb-3(b)(1), unless the authorization is  terminated or revoked. Performed at [facility], [place] [address], [place], [place] [address]   Blood culture (routine x 2)     Status: None (Preliminary result)   Collection Time: 12/21/19  9:50 PM   Specimen: BLOOD  Result Value Ref Range Status   Specimen Description   Final    BLOOD RIGHT ANTECUBITAL Performed at [facility] [address] [place], [place] [address]    Special Requests   Final    BOTTLES DRAWN AEROBIC AND ANAEROBIC Blood Culture adequate volume Performed at [facility] [address] [place], [place] [address]    Culture   Final    NO GROWTH < 12 HOURS Performed at [facility] [address] [place], [place] [address]    Report Status PENDING  Incomplete     Radiology Studies: No results found. CT ABDOMEN PELVIS W CONTRAST    (Results Pending)    Scheduled Meds: . azaTHIOprine  100 mg Oral Daily  . carvedilol  6.25 mg Oral BID WC  . Chlorhexidine Gluconate Cloth  6 each Topical Daily  . famotidine  40 mg Oral Daily  . gabapentin  100 mg Oral BID  . heparin  5,000 Units Subcutaneous Q8H  . iohexol  500 mL Oral Q1H  . montelukast  10 mg Oral QPC supper  . Nintedanib  150 mg Oral BID  . phenazopyridine  200 mg Oral TID WC  . Pirfenidone  1 tablet Oral TID WC  .  predniSONE  10 mg Oral Q breakfast  . sodium chloride flush  10-40 mL Intracatheter Q12H  . spironolactone  100 mg Oral Daily  . sulfamethoxazole-trimethoprim  1 tablet Oral Q M,W,F  . thyroid  90 mg Oral QAC breakfast   PRN Meds: acetaminophen **OR** acetaminophen, albuterol, LORazepam, methocarbamol (ROBAXIN) IV, ondansetron **OR** ondansetron (ZOFRAN) IV, oxyCODONE, sodium chloride flush Continuous Infusions: . sodium chloride 50 mL/hr at 12/22/19 0126  . meropenem (MERREM) IV 1 g (12/22/19 0549)  . methocarbamol (ROBAXIN) IV 500 mg (12/22/19 0015)     LOS: 0 days  Time spent: Greater than 50% of the 35 minute visit was spent in counseling/coordination of care for the patient as laid out in the A&P.   Dwyane Dee, MD [facility] 12/22/2019, 1:24 PM

## 2019-12-22 NOTE — Plan of Care (Signed)

## 2019-12-22 NOTE — Assessment & Plan Note (Signed)
-  Patient on 10 L nasal cannula at baseline -Continue O2

## 2019-12-22 NOTE — Hospital Course (Addendum)
Tasha Ballard  is a 49 y.o. female, with history of vitamin D deficiency, thyroid disease, advanced pulmonary fibrosis, hypertension, diabetes mellitus type 2 without complication who presented to the ED with a chief complaint of dysuria.    Patient is immunosuppressed since having chemo for pulmonary fibrosis.  She is on prophylactic Bactrim 3 times a week, and prednisone daily.  Her last chemo treatment was 1 year ago.  She has a right chest port in place as there is now a suspicious nodule on her chest imaging.  Patient reports that since she had chemo a year ago she has started having UTIs.  This is her third episode.   She reports that she called her pulmonologist and was prescribed 5 or 6 days of Cipro.  10 days later she started having dysuria again.  She called her PCP who doubled her Cipro dosage to 500 mg twice daily, and ordered a urine culture.  Urine culture came back and showed ESBL Proteus possible sens to Augmentin. She was prescribed 10 days of Augmentin.  She reports that that made her constipated and gave her yeast infection.  She was then given Diflucan.  She also started experiencing hemorrhoids due to the constipation, and had to use suppositories.  She finished the Augmentin and 5 or 6 days later she started having dysuria again.   She had a repeat urine culture that showed an increase in the Proteus mirabilis from 50,000 to greater than 100,000 colony-forming units.   Patient does admit to associated fevers and chills however also describes some perimenopausal symptoms.  She reports that she has had severe back pain on the right side at the level of her kidney for several weeks.  She also reports lower abdominal pain.  Both of these pains are worse right before she voids.  Patient is nauseous and reports that her last normal meal was yesterday, but she is not often eating normal meals.  She often forgets to eat, or is nauseous and throws back up whenever she is eating.   In the  ED Temperature 97.8, heart rate 110, respiratory rate 18, blood pressure 136/98, satting at 97% on 10 L nasal cannula UA notable for positive nitrites, negative leukocyte esterase, and no bacteria seen; 0-5 WBC.  She was started on meropenem and admitted for further treatment and monitoring.  She also underwent CT abdomen/pelvis for evaluation of perinephric abscess/pyelonephritis given her lingering symptoms and outpatient failure of oral medications.  This was negative for obstruction or pyelonephritis. She was found to have a thickened appendix without periappendiceal inflammation or fluid but still some concern for possible early acute appendicitis. She was more tender in her right lower quadrant on 12/23/2019, therefore surgery was consulted for further evaluation. Due to increased risk of surgery and potential of only mild appendicitis at this time, decision was made to continue treatment with antibiotics with repeat CT to re-eval.

## 2019-12-23 DIAGNOSIS — R1031 Right lower quadrant pain: Secondary | ICD-10-CM

## 2019-12-23 DIAGNOSIS — R109 Unspecified abdominal pain: Secondary | ICD-10-CM

## 2019-12-23 LAB — CBC WITH DIFFERENTIAL/PLATELET
Abs Immature Granulocytes: 0.05 10*3/uL (ref 0.00–0.07)
Basophils Absolute: 0 10*3/uL (ref 0.0–0.1)
Basophils Relative: 1 %
Eosinophils Absolute: 0.1 10*3/uL (ref 0.0–0.5)
Eosinophils Relative: 2 %
HCT: 30.9 % — ABNORMAL LOW (ref 36.0–46.0)
Hemoglobin: 9.9 g/dL — ABNORMAL LOW (ref 12.0–15.0)
Immature Granulocytes: 1 %
Lymphocytes Relative: 7 %
Lymphs Abs: 0.3 10*3/uL — ABNORMAL LOW (ref 0.7–4.0)
MCH: 32.4 pg (ref 26.0–34.0)
MCHC: 32 g/dL (ref 30.0–36.0)
MCV: 101 fL — ABNORMAL HIGH (ref 80.0–100.0)
Monocytes Absolute: 0.5 10*3/uL (ref 0.1–1.0)
Monocytes Relative: 13 %
Neutro Abs: 3 10*3/uL (ref 1.7–7.7)
Neutrophils Relative %: 76 %
Platelets: 204 10*3/uL (ref 150–400)
RBC: 3.06 MIL/uL — ABNORMAL LOW (ref 3.87–5.11)
RDW: 14.4 % (ref 11.5–15.5)
WBC: 3.9 10*3/uL — ABNORMAL LOW (ref 4.0–10.5)
nRBC: 0 % (ref 0.0–0.2)

## 2019-12-23 LAB — GLUCOSE, CAPILLARY
Glucose-Capillary: 88 mg/dL (ref 70–99)
Glucose-Capillary: 121 mg/dL — ABNORMAL HIGH (ref 70–99)
Glucose-Capillary: 160 mg/dL — ABNORMAL HIGH (ref 70–99)

## 2019-12-23 LAB — BASIC METABOLIC PANEL
Anion gap: 9 (ref 5–15)
BUN: 6 mg/dL (ref 6–20)
CO2: 32 mmol/L (ref 22–32)
Calcium: 8.4 mg/dL — ABNORMAL LOW (ref 8.9–10.3)
Chloride: 96 mmol/L — ABNORMAL LOW (ref 98–111)
Creatinine, Ser: 0.62 mg/dL (ref 0.44–1.00)
GFR, Estimated: >60 mL/min (ref >60)
Glucose, Bld: 90 mg/dL (ref 70–99)
Potassium: 2.9 mmol/L — ABNORMAL LOW (ref 3.5–5.1)
Sodium: 137 mmol/L (ref 135–145)

## 2019-12-23 LAB — URINE CULTURE: Culture: <10000 — AB

## 2019-12-23 LAB — MAGNESIUM: Magnesium: 2.1 mg/dL (ref 1.7–2.4)

## 2019-12-23 MED ORDER — POTASSIUM CHLORIDE CRYS ER 20 MEQ PO TBCR
40.0000 meq | EXTENDED_RELEASE_TABLET | Freq: Once | ORAL | Status: AC
  Administered 2019-12-23: 40 meq via ORAL
  Filled 2019-12-23: qty 2

## 2019-12-23 MED ORDER — METRONIDAZOLE 0.75 % VA GEL
1.0000 | Freq: Every day | VAGINAL | Status: AC
  Administered 2019-12-25: 1 via VAGINAL
  Filled 2019-12-23: qty 70

## 2019-12-23 MED ORDER — POTASSIUM CHLORIDE 10 MEQ/100ML IV SOLN
10.0000 meq | INTRAVENOUS | Status: AC
  Administered 2019-12-23 (×2): 10 meq via INTRAVENOUS
  Filled 2019-12-23 (×2): qty 100

## 2019-12-23 NOTE — Progress Notes (Signed)
PROGRESS NOTE    Tasha Ballard   ZOX:[id number]  DOB: 1970/04/04  DOA: 12/21/2019     1  PCP: [name], MD  CC: dysuria  Hospital Course: Tasha Ballard  is a 49 y.o. female, with history of vitamin D deficiency, thyroid disease, advanced pulmonary fibrosis, hypertension, diabetes mellitus type 2 without complication who presented to the ED with a chief complaint of dysuria.    Patient is immunosuppressed since having chemo for pulmonary fibrosis.  She is on prophylactic Bactrim 3 times a week, and prednisone daily.  Her last chemo treatment was 1 year ago.  She has a right chest port in place as there is now a suspicious nodule on her chest imaging.  Patient reports that since she had chemo a year ago she has started having UTIs.  This is her third episode.   She reports that she called her pulmonologist and was prescribed 5 or 6 days of Cipro.  10 days later she started having dysuria again.  She called her PCP who doubled her Cipro dosage to 500 mg twice daily, and ordered a urine culture.  Urine culture came back and showed ESBL Proteus possible sens to Augmentin. She was prescribed 10 days of Augmentin.  She reports that that made her constipated and gave her yeast infection.  She was then given Diflucan.  She also started experiencing hemorrhoids due to the constipation, and had to use suppositories.  She finished the Augmentin and 5 or 6 days later she started having dysuria again.   She had a repeat urine culture that showed an increase in the Proteus mirabilis from 50,000 to greater than 100,000 colony-forming units.   Patient does admit to associated fevers and chills however also describes some perimenopausal symptoms.  She reports that she has had severe back pain on the right side at the level of her kidney for several weeks.  She also reports lower abdominal pain.  Both of these pains are worse right before she voids.  Patient is nauseous and reports that her last  normal meal was yesterday, but she is not often eating normal meals.  She often forgets to eat, or is nauseous and throws back up whenever she is eating.   In the ED Temperature 97.8, heart rate 110, respiratory rate 18, blood pressure 136/98, satting at 97% on 10 L nasal cannula UA notable for positive nitrites, negative leukocyte esterase, and no bacteria seen; 0-5 WBC.  She was started on meropenem and admitted for further treatment and monitoring.  She also underwent CT abdomen/pelvis for evaluation of perinephric abscess/pyelonephritis given her lingering symptoms and outpatient failure of oral medications.  This was negative for obstruction or pyelonephritis. She was found to have a thickened appendix without periappendiceal inflammation or fluid but still some concern for possible early acute appendicitis. She was more tender in her right lower quadrant on 12/23/2019, therefore surgery was consulted for further evaluation.   Interval History:  She became nauseous yesterday when drinking contrast for the CT and vomited. Since then her stomach/abdomen has been sore/achy and she is nauseous and not eating much.  To me this morning she does appear more  Uncomfortable just sitting in the chair; however she also notes that occasionally she just has chronic pains that make her feel bad so she doesn't focus on acute changes as much.  Husband also bedside and updated/present for exam and conversation.   Old records reviewed in assessment of this patient  ROS: Constitutional: positive  for chills, fatigue, fevers and malaise, Respiratory: negative for cough, Cardiovascular: negative for chest pain and Gastrointestinal: negative for abdominal pain  Assessment & Plan: * Complicated UTI (urinary tract infection) - patient presented with chills, fatigue, flank/pelvic, and CVA tenderness - UCx growing Proteus ESBL. The MIC was considered too high for Augmentin outpatient, I suspect this is why she did  not respond well. Symptoms now have continued for approximately 3 weeks and she is s/p 3 courses of abx with increased colony count on last 2 urine cultures, suggesting untreated infection - now on meropenem; continue this for likely 7 day total course (likely can change to Ertapenem at discharge for daily dosing if able to arrange H/H or infusion center) - needs repeat Urine specimen after ~48 hours for clearance - admission UCx "<10k colonies", being re-incubated - CT abd/pelvis to evaluate for abscess and/or pyelo: negative for abscess or pyelo on CT  Abdominal pain - patient has a degree of chronic pains at home and she has a hard time delineating how different pain is currently compared to her usual chronic pains.  Regardless, she does appear to have an increased amount of tenderness in her right lower quadrant. She also vomited yesterday and has been nauseous since.  - CT abdomen/pelvis on 12/22/2019 reveals a thickened appendix but no overt signs of acute appendicitis however there was concern for possible early appendicitis on the CT -will ask for surgery evaluation due to these findings -If she is in need of surgery, will need pulmonology involvement given her underlying level of oxygen demand, fibrosis, and chronic steroid  Chronic respiratory failure with hypoxia ([place]) -Patient on 10 L nasal cannula at baseline -Continue O2  Idiopathic interstitial pneumonia ([place]) -Follows closely with Dr. [name] -Continue Ofev and Esbriet -Continue chronic prednisone 10 mg daily; no indication for stress dosing at this time but will continue to monitor in setting of infection  Hypokalemia -Replete and recheck as needed  Hypothyroidism -Continue [place] Thyroid  Antimicrobials: Meropenem 12/21/2019>> present  DVT prophylaxis: HSQ Code Status: Full Family Communication: Husband bedside Disposition Plan: Status is: Inpatient  Remains inpatient appropriate because:Ongoing active pain  requiring inpatient pain management, Ongoing diagnostic testing needed not appropriate for outpatient work up, Unsafe d/c plan, IV treatments appropriate due to intensity of illness or inability to take PO and Inpatient level of care appropriate due to severity of illness   Dispo: The patient is from: Home              Anticipated d/c is to: Home              Anticipated d/c date is: > 3 days              Patient currently is not medically stable to d/c.  Objective: Blood pressure 129/74, pulse 91, temperature 98.7 F (37.1 C), temperature source Oral, resp. rate 20, height 5' 2.01" (1.575 m), weight 90.3 kg, SpO2 99 %.  Examination: General appearance: alert, cooperative and no distress Head: Normocephalic, without obvious abnormality, atraumatic Eyes: EOMI Lungs: clear to auscultation bilaterally  Chest: Right-sided port in place Heart: regular rate and rhythm and S1, S2 normal  Back: no further CVA TTP Abdomen: now is tender to deep palpation in RLQ; no obvious guarding or robound. BS still present Extremities: trace LE edema Skin: mobility and turgor normal Neurologic: Grossly normal  Consultants:   Surgery  Procedures:   None  Data Reviewed: I have personally reviewed following labs and imaging studies Results  for orders placed or performed during the hospital encounter of 12/21/19 (from the past 24 hour(s))  Glucose, capillary     Status: Abnormal   Collection Time: 12/22/19 12:13 PM  Result Value Ref Range   Glucose-Capillary 130 (H) 70 - 99 mg/dL  Glucose, capillary     Status: Abnormal   Collection Time: 12/22/19  4:40 PM  Result Value Ref Range   Glucose-Capillary 149 (H) 70 - 99 mg/dL  Basic metabolic panel     Status: Abnormal   Collection Time: 12/23/19  3:39 AM  Result Value Ref Range   Sodium 137 135 - 145 mmol/L   Potassium 2.9 (L) 3.5 - 5.1 mmol/L   Chloride 96 (L) 98 - 111 mmol/L   CO2 32 22 - 32 mmol/L   Glucose, Bld 90 70 - 99 mg/dL   BUN 6 6 - 20  mg/dL   Creatinine, Ser 0.62 0.44 - 1.00 mg/dL   Calcium 8.4 (L) 8.9 - 10.3 mg/dL   GFR, Estimated >60 >60 mL/min   Anion gap 9 5 - 15  CBC with Differential/Platelet     Status: Abnormal   Collection Time: 12/23/19  3:39 AM  Result Value Ref Range   WBC 3.9 (L) 4.0 - 10.5 K/uL   RBC 3.06 (L) 3.87 - 5.11 MIL/uL   Hemoglobin 9.9 (L) 12.0 - 15.0 g/dL   HCT 30.9 (L) 36 - 46 %   MCV 101.0 (H) 80.0 - 100.0 fL   MCH 32.4 26.0 - 34.0 pg   MCHC 32.0 30.0 - 36.0 g/dL   RDW 14.4 11.5 - 15.5 %   Platelets 204 150 - 400 K/uL   nRBC 0.0 0.0 - 0.2 %   Neutrophils Relative % 76 %   Neutro Abs 3.0 1.7 - 7.7 K/uL   Lymphocytes Relative 7 %   Lymphs Abs 0.3 (L) 0.7 - 4.0 K/uL   Monocytes Relative 13 %   Monocytes Absolute 0.5 0.1 - 1.0 K/uL   Eosinophils Relative 2 %   Eosinophils Absolute 0.1 0.0 - 0.5 K/uL   Basophils Relative 1 %   Basophils Absolute 0.0 0.0 - 0.1 K/uL   Immature Granulocytes 1 %   Abs Immature Granulocytes 0.05 0.00 - 0.07 K/uL  Magnesium     Status: None   Collection Time: 12/23/19  3:39 AM  Result Value Ref Range   Magnesium 2.1 1.7 - 2.4 mg/dL  Glucose, capillary     Status: None   Collection Time: 12/23/19  8:01 AM  Result Value Ref Range   Glucose-Capillary 88 70 - 99 mg/dL    Recent Results (from the past 240 hour(s))  Urine culture     Status: Abnormal   Collection Time: 12/21/19  6:44 PM   Specimen: Urine, Clean Catch  Result Value Ref Range Status   Specimen Description   Final    URINE, CLEAN CATCH Performed at [facility], [place] [address] [place], [place] [address]    Special Requests   Final    NONE Performed at [facility], [place] [address] [place], [place] [address]    Culture (A)  Final    <10,000 COLONIES/mL INSIGNIFICANT GROWTH Performed at [facility] [address], [place], [place] [address]    Report Status 12/23/2019 FINAL  Final  Blood culture (routine x 2)     Status: None (Preliminary  result)   Collection Time: 12/21/19  9:40 PM   Specimen: BLOOD  Result  Value Ref Range Status   Specimen Description   Final    BLOOD LEFT ANTECUBITAL Performed at [facility] [address] [place], [place] [address]    Special Requests   Final    BOTTLES DRAWN AEROBIC AND ANAEROBIC Blood Culture adequate volume Performed at [facility] [address] [place], [place] [address]    Culture   Final    NO GROWTH 1 DAY Performed at [facility], [place] [address], [place], [place] [address]    Report Status PENDING  Incomplete  Respiratory Panel by RT PCR (Flu A&B, Covid) - Nasopharyngeal Swab     Status: None   Collection Time: 12/21/19  9:50 PM   Specimen: Nasopharyngeal Swab  Result Value Ref Range Status   SARS Coronavirus 2 by RT PCR NEGATIVE NEGATIVE Final    Comment: (NOTE) SARS-CoV-2 target nucleic acids are NOT DETECTED.  The SARS-CoV-2 RNA is generally detectable in upper respiratoy specimens during the acute phase of infection. The lowest concentration of SARS-CoV-2 viral copies this assay can detect is 131 copies/mL. A negative result does not preclude SARS-Cov-2 infection and should not be used as the sole basis for treatment or other patient management decisions. A negative result may occur with  improper specimen collection/handling, submission of specimen other than nasopharyngeal swab, presence of viral mutation(s) within the areas targeted by this assay, and inadequate number of viral copies (<131 copies/mL). A negative result must be combined with clinical observations, patient history, and epidemiological information. The expected result is Negative.  Fact Sheet for Patients:  PinkCheek.be  Fact Sheet for Healthcare Providers:  GravelBags.it  This test is no t yet approved or cleared by the [place] FDA and  has been authorized for detection and/or  diagnosis of SARS-CoV-2 by FDA under an Emergency Use Authorization (EUA). This EUA will remain  in effect (meaning this test can be used) for the duration of the COVID-19 declaration under Section 564(b)(1) of the Act, 21 U.S.C. section 360bbb-3(b)(1), unless the authorization is terminated or revoked sooner.     Influenza A by PCR NEGATIVE NEGATIVE Final   Influenza B by PCR NEGATIVE NEGATIVE Final    Comment: (NOTE) The Xpert Xpress SARS-CoV-2/FLU/RSV assay is intended as an aid in  the diagnosis of influenza from Nasopharyngeal swab specimens and  should not be used as a sole basis for treatment. Nasal washings and  aspirates are unacceptable for Xpert Xpress SARS-CoV-2/FLU/RSV  testing.  Fact Sheet for Patients: PinkCheek.be  Fact Sheet for Healthcare Providers: GravelBags.it  This test is not yet approved or cleared by the [place] FDA and  has been authorized for detection and/or diagnosis of SARS-CoV-2 by  FDA under an Emergency Use Authorization (EUA). This EUA will remain  in effect (meaning this test can be used) for the duration of the  Covid-19 declaration under Section 564(b)(1) of the Act, 21  U.S.C. section 360bbb-3(b)(1), unless the authorization is  terminated or revoked. Performed at [facility], [place] [address], [place], [place] [address]   Blood culture (routine x 2)     Status: None (Preliminary result)   Collection Time: 12/21/19  9:50 PM   Specimen: BLOOD  Result Value Ref Range Status   Specimen Description   Final    BLOOD RIGHT ANTECUBITAL Performed at [facility] [address], [place], [place] [address]    Special Requests   Final    BOTTLES DRAWN AEROBIC AND  ANAEROBIC Blood Culture adequate volume Performed at [facility] [address] [place], [place] [address]    Culture   Final    NO GROWTH 1 DAY Performed at  [facility], [place] [address] [place], [place] [address]    Report Status PENDING  Incomplete     Radiology Studies: CT ABDOMEN PELVIS W CONTRAST  Result Date: 12/22/2019 CLINICAL DATA:  Pyelonephritis complicated urinary tract infection. History of lung cancer with radiation treatment. History of celiac artery aneurysm. LOWER back pain for 6-8 weeks. EXAM: CT ABDOMEN AND PELVIS WITH CONTRAST TECHNIQUE: Multidetector CT imaging of the abdomen and pelvis was performed using the standard protocol following bolus administration of intravenous contrast. CONTRAST:  130m OMNIPAQUE IOHEXOL 300 MG/ML  SOLN COMPARISON:  Plain film 12/22/2019 and CT 10/01/2019, 10/30/2010 FINDINGS: Lower chest: Are stable, chronic changes in the lungs, involving the lingula, RIGHT middle lobe, and both LOWER lobes. In these regions there is diffuse ground-glass opacity, bronchiolectasis, and honeycomb changes. No pleural effusions. Heart size is normal. Hepatobiliary: No focal liver abnormality is seen. No radiopaque gallstones, biliary dilatation, or pericholecystic inflammatory changes. Pancreas: Unremarkable. No pancreatic ductal dilatation or surrounding inflammatory changes. Spleen: Normal in size without focal abnormality. Adrenals/Urinary Tract: Adrenal glands are normal. Kidneys are symmetric in size and enhancement. Appropriate excretion bilaterally. There is a 1.2 centimeter cyst in the LATERAL UPPER pole region of the LEFT kidney. No solid mass, hydronephrosis, or perinephric stranding. The ureters are unremarkable. The bladder and visualized portion of the urethra are normal. Stomach/Bowel: Stomach is unremarkable. There is artifact in the region of the distal stomach and duodenal from vascular coils. Small bowel loops are unremarkable. The appendix is mildly thickened, measuring 10 millimeters on image 45 of series 7. There is no periappendiceal inflammatory change or fluid. Vascular/Lymphatic: Abdominal aorta is  partially calcified. Celiac stent graft. Numerous vascular coils along the MEDIAL wall of the stomach and duodenum. No adenopathy. Reproductive: Uterus is present.  No adnexal mass. Other: No free pelvic fluid. Subcutaneous injection sites in the anterior abdominal wall. Musculoskeletal: No acute or significant osseous findings. IMPRESSION: 1. No evidence for urinary tract obstruction or pyelonephritis. 2. Mildly thickened appendix without periappendiceal inflammatory change or fluid. Findings raise a question for early acute appendicitis. Recommend correlation with targeted physical exam. 3. Stable chronic changes in the lungs. 4. Celiac stent graft.  Vascular coils in the RIGHT UPPER QUADRANT. 5. Aortic Atherosclerosis (ICD10-I70.0). These results will be called to the ordering clinician or representative by the Radiologist Assistant, and communication documented in the PACS or [employer] Electronically Signed   By: ENolon NationsM.D.   On: 12/22/2019 16:45   CT ABDOMEN PELVIS W CONTRAST  Final Result      Scheduled Meds: . azaTHIOprine  100 mg Oral Daily  . carvedilol  6.25 mg Oral BID WC  . Chlorhexidine Gluconate Cloth  6 each Topical Daily  . famotidine  40 mg Oral Daily  . gabapentin  100 mg Oral BID  . heparin  5,000 Units Subcutaneous Q8H  . montelukast  10 mg Oral QPC supper  . Nintedanib  150 mg Oral BID  . phenazopyridine  200 mg Oral TID WC  . Pirfenidone  1 tablet Oral TID WC  . predniSONE  10 mg Oral Q breakfast  . sodium chloride flush  10-40 mL Intracatheter Q12H  . spironolactone  100 mg Oral Daily  . sulfamethoxazole-trimethoprim  1 tablet Oral Q M,W,F  . thyroid  90 mg Oral QAC breakfast   PRN Meds: acetaminophen **OR** acetaminophen, albuterol, LORazepam, methocarbamol (ROBAXIN) IV, ondansetron **OR** ondansetron (ZOFRAN) IV, oxyCODONE, sodium chloride flush Continuous Infusions: . sodium chloride 50 mL/hr at 12/23/19 0622  . meropenem (MERREM) IV 1 g  (12/23/19 8446)  . methocarbamol (ROBAXIN) IV Stopped (12/22/19 2209)     LOS: 1 day  Time spent: Greater than 50% of the 35 minute visit was spent in counseling/coordination of care for the patient as laid out in the A&P.   Dwyane Dee, MD [facility] 12/23/2019, 11:41 AM

## 2019-12-23 NOTE — Consult Note (Signed)
Reason for Consult:abnl appendix on ct scan Referring Physician: Dr Ladonna Tasha Ballard is an 49 y.o. female.  HPI: 22 yof with history lung ca s/p radiotherapy, pulm fibrosis, dm, htn who presents with 8 weeks of lower abdominal pain and treatment for possible uti.  She has undergone several rounds of abx but still has some lower abdominal pain and cx pos uti for which she was recommended admission for iv abx.  She states the current lower suprapubic and rlq pain has been present for two weeks. Not any worse.  No change in her bowel habits. No fevers.  Pain is worse before she voids.  Not eating normally throught this.  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 I was asked to see her.she is on merrem for the uti   Past Medical History:  Diagnosis Date  . Aneurysm ([place])   . Back pain   . Cancer ([place])   . Diabetes mellitus without complication ([place])   . H/O blood clots   . Hypertension   . Joint pain   . Pulmonary fibrosis ([place])   . SOB (shortness of breath)   . Swelling   . Thyroid disease   . Vitamin D deficiency     Past Surgical History:  Procedure Laterality Date  . ABLATION    . IR IMAGING GUIDED PORT INSERTION  01/26/2018  . KNEE SURGERY    . LUNG BIOPSY      Family History  Problem Relation Age of Onset  . Heart failure Mother   . Heart disease Mother   . Rheumatologic disease Father     Social History:  reports that she has never smoked. She has never used smokeless tobacco. She reports current alcohol use. She reports that she does not use drugs.  Allergies:  Allergies  Allergen Reactions  . Macrobid [Nitrofurantoin Macrocrystal] Nausea And Vomiting  . Adhesive [Tape] Rash    Medications: reviewed Results for orders placed or performed during the hospital encounter of 12/21/19 (from the past 48 hour(s))   Urinalysis, Routine w reflex microscopic Urine, Clean Catch     Status: Abnormal   Collection Time: 12/21/19  6:42 PM  Result Value Ref Range   Color, Urine AMBER (A) YELLOW    Comment: BIOCHEMICALS MAY BE AFFECTED BY COLOR   APPearance CLEAR CLEAR   Specific Gravity, Urine 1.009 1.005 - 1.030   pH 7.0 5.0 - 8.0   Glucose, UA NEGATIVE NEGATIVE mg/dL   Hgb urine dipstick NEGATIVE NEGATIVE   Bilirubin Urine NEGATIVE NEGATIVE   Ketones, ur 5 (A) NEGATIVE mg/dL   Protein, ur 100 (A) NEGATIVE mg/dL   Nitrite POSITIVE (A) NEGATIVE   Leukocytes,Ua NEGATIVE NEGATIVE   RBC / HPF 0-5 0 - 5 RBC/hpf   WBC, UA 0-5 0 - 5 WBC/hpf   Bacteria, UA NONE SEEN NONE SEEN   Squamous Epithelial / LPF 0-5 0 - 5   Mucus PRESENT    Hyaline Casts, UA PRESENT     Comment: Performed at [facility], [place] [address] [place], [place] [address]  Urine culture     Status: Abnormal   Collection Time: 12/21/19  6:44 PM   Specimen: Urine, Clean Catch  Result Value Ref Range   Specimen Description      URINE, CLEAN CATCH Performed at [facility], [place]  [address] [place], [place] [address]    Special Requests      NONE Performed at [facility], [place] [address] [place], [place] [address]    Culture (A)     <10,000 COLONIES/mL INSIGNIFICANT GROWTH Performed at [place] [address] [place], [place] [address]    Report Status 12/23/2019 FINAL   Basic metabolic panel     Status: Abnormal   Collection Time: 12/21/19  7:08 PM  Result Value Ref Range   Sodium 131 (L) 135 - 145 mmol/L   Potassium 3.2 (L) 3.5 - 5.1 mmol/L   Chloride 82 (L) 98 - 111 mmol/L   CO2 29 22 - 32 mmol/L   Glucose, Bld 159 (H) 70 - 99 mg/dL    Comment: Glucose reference range applies only to samples taken after fasting for at least 8 hours.   BUN 10 6 - 20 mg/dL   Creatinine, Ser 1.05 (H) 0.44 - 1.00 mg/dL   Calcium 9.9 8.9 - 10.3 mg/dL   GFR, Estimated >60 >60  mL/min    Comment: (NOTE) Calculated using the CKD-EPI Creatinine Equation (2021)    Anion gap 20 (H) 5 - 15    Comment: Performed at [facility], [place] [address] [place], [place] [address]  CBC     Status: None   Collection Time: 12/21/19  7:08 PM  Result Value Ref Range   WBC 9.0 4.0 - 10.5 K/uL   RBC 4.51 3.87 - 5.11 MIL/uL   Hemoglobin 14.1 12.0 - 15.0 g/dL   HCT 43.3 36 - 46 %   MCV 96.0 80.0 - 100.0 fL   MCH 31.3 26.0 - 34.0 pg   MCHC 32.6 30.0 - 36.0 g/dL   RDW 14.1 11.5 - 15.5 %   Platelets 294 150 - 400 K/uL   nRBC 0.0 0.0 - 0.2 %    Comment: Performed at [facility], [place] [address] [place], [place] [address]  Blood culture (routine x 2)     Status: None (Preliminary result)   Collection Time: 12/21/19  9:40 PM   Specimen: BLOOD  Result Value Ref Range   Specimen Description      BLOOD LEFT ANTECUBITAL Performed at [facility], [place] [address] [place], [place] [address]    Special Requests      BOTTLES DRAWN AEROBIC AND ANAEROBIC Blood Culture adequate volume Performed at [place] [address] [place], [place] [address]    Culture      NO GROWTH 1 DAY Performed at [place] [address] [place], [place] [address]    Report Status PENDING   Respiratory Panel by RT PCR (Flu A&B, Covid) - Nasopharyngeal Swab     Status: None   Collection Time: 12/21/19  9:50 PM   Specimen: Nasopharyngeal Swab  Result Value Ref Range   SARS Coronavirus 2 by RT PCR NEGATIVE NEGATIVE    Comment: (NOTE) SARS-CoV-2 target nucleic acids are NOT DETECTED.  The SARS-CoV-2 RNA is generally detectable in upper respiratoy specimens during the acute phase of infection. The lowest concentration of SARS-CoV-2 viral copies this assay can detect is 131 copies/mL. A negative result does not preclude SARS-Cov-2 infection and should not be used as the sole basis for treatment or other patient  management decisions. A negative result may occur with  improper specimen collection/handling, submission of specimen other than nasopharyngeal swab, presence of viral mutation(s) within the areas  targeted by this assay, and inadequate number of viral copies (<131 copies/mL). A negative result must be combined with clinical observations, patient history, and epidemiological information. The expected result is Negative.  Fact Sheet for Patients:  PinkCheek.be  Fact Sheet for Healthcare Providers:  GravelBags.it  This test is no t yet approved or cleared by the [place] FDA and  has been authorized for detection and/or diagnosis of SARS-CoV-2 by FDA under an Emergency Use Authorization (EUA). This EUA will remain  in effect (meaning this test can be used) for the duration of the COVID-19 declaration under Section 564(b)(1) of the Act, 21 U.S.C. section 360bbb-3(b)(1), unless the authorization is terminated or revoked sooner.     Influenza A by PCR NEGATIVE NEGATIVE   Influenza B by PCR NEGATIVE NEGATIVE    Comment: (NOTE) The Xpert Xpress SARS-CoV-2/FLU/RSV assay is intended as an aid in  the diagnosis of influenza from Nasopharyngeal swab specimens and  should not be used as a sole basis for treatment. Nasal washings and  aspirates are unacceptable for Xpert Xpress SARS-CoV-2/FLU/RSV  testing.  Fact Sheet for Patients: PinkCheek.be  Fact Sheet for Healthcare Providers: GravelBags.it  This test is not yet approved or cleared by the [place] FDA and  has been authorized for detection and/or diagnosis of SARS-CoV-2 by  FDA under an Emergency Use Authorization (EUA). This EUA will remain  in effect (meaning this test can be used) for the duration of the  Covid-19 declaration under Section 564(b)(1) of the Act, 21  U.S.C. section 360bbb-3(b)(1), unless the  authorization is  terminated or revoked. Performed at [facility], [place] [address] [place], [place] [address]   Blood culture (routine x 2)     Status: None (Preliminary result)   Collection Time: 12/21/19  9:50 PM   Specimen: BLOOD  Result Value Ref Range   Specimen Description      BLOOD RIGHT ANTECUBITAL Performed at [facility], [place] [address], [place], [place] [address]    Special Requests      BOTTLES DRAWN AEROBIC AND ANAEROBIC Blood Culture adequate volume Performed at [place] [address], [place], [place] [address]    Culture      NO GROWTH 1 DAY Performed at [place] [address], [place], [place] [address]    Report Status PENDING   HIV Antibody (routine testing w rflx)     Status: None   Collection Time: 12/22/19  3:15 AM  Result Value Ref Range   HIV Screen 4th Generation wRfx Non Reactive Non Reactive    Comment: Performed at [facility], [place] [address], [place],  [address]  Comprehensive metabolic panel     Status: Abnormal   Collection Time: 12/22/19  3:15 AM  Result Value Ref Range   Sodium 134 (L) 135 - 145 mmol/L   Potassium 3.2 (L) 3.5 - 5.1 mmol/L   Chloride 88 (L) 98 - 111 mmol/L   CO2 32 22 - 32 mmol/L   Glucose, Bld 81 70 - 99 mg/dL    Comment: Glucose reference range applies only to samples taken after fasting for at least 8 hours.   BUN 11 6 - 20 mg/dL   Creatinine, Ser 0.87 0.44 - 1.00 mg/dL   Calcium 9.0 8.9 - 10.3 mg/dL   Total Protein 5.6 (L) 6.5 - 8.1 g/dL   Albumin 3.2 (L) 3.5 - 5.0 g/dL   AST 17 15 - 41  U/L   ALT 16 0 - 44 U/L   Alkaline Phosphatase 35 (L) 38 - 126 U/L   Total Bilirubin 0.8 0.3 - 1.2 mg/dL   GFR, Estimated >60 >60 mL/min    Comment: (NOTE) Calculated using the CKD-EPI Creatinine Equation (2021)    Anion gap 14 5 - 15    Comment: Performed at [facility], [place] [address] [place], [place] [address]   Magnesium     Status: None   Collection Time: 12/22/19  3:15 AM  Result Value Ref Range   Magnesium 1.8 1.7 - 2.4 mg/dL    Comment: Performed at [facility], [place] [address], [place], [place] [address]  CBC WITH DIFFERENTIAL     Status: Abnormal   Collection Time: 12/22/19  3:15 AM  Result Value Ref Range   WBC 5.5 4.0 - 10.5 K/uL   RBC 3.58 (L) 3.87 - 5.11 MIL/uL   Hemoglobin 11.5 (L) 12.0 - 15.0 g/dL   HCT 35.6 (L) 36 - 46 %   MCV 99.4 80.0 - 100.0 fL   MCH 32.1 26.0 - 34.0 pg   MCHC 32.3 30.0 - 36.0 g/dL   RDW 14.3 11.5 - 15.5 %   Platelets 223 150 - 400 K/uL   nRBC 0.0 0.0 - 0.2 %   Neutrophils Relative % 79 %   Neutro Abs 4.3 1.7 - 7.7 K/uL   Lymphocytes Relative 7 %   Lymphs Abs 0.4 (L) 0.7 - 4.0 K/uL   Monocytes Relative 13 %   Monocytes Absolute 0.7 0.1 - 1.0 K/uL   Eosinophils Relative 0 %   Eosinophils Absolute 0.0 0.0 - 0.5 K/uL   Basophils Relative 0 %   Basophils Absolute 0.0 0.0 - 0.1 K/uL   Immature Granulocytes 1 %   Abs Immature Granulocytes 0.05 0.00 - 0.07 K/uL    Comment: Performed at [facility], [place] [address] [place], [place] [address]  TSH     Status: None   Collection Time: 12/22/19  3:15 AM  Result Value Ref Range   TSH 0.742 0.350 - 4.500 uIU/mL    Comment: Performed by a 3rd Generation assay with a functional sensitivity of <=0.01 uIU/mL. Performed at [facility], [place] [address], [place], [place] [address]   Glucose, capillary     Status: Abnormal   Collection Time: 12/22/19  8:03 AM  Result Value Ref Range   Glucose-Capillary 182 (H) 70 - 99 mg/dL    Comment: Glucose reference range applies only to samples taken after fasting for at least 8 hours.  Glucose, capillary     Status: Abnormal   Collection Time: 12/22/19 12:13 PM  Result Value Ref Range   Glucose-Capillary 130 (H) 70 - 99 mg/dL    Comment: Glucose reference range applies only to samples taken after fasting for at least  8 hours.  Glucose, capillary     Status: Abnormal   Collection Time: 12/22/19  4:40 PM  Result Value Ref Range   Glucose-Capillary 149 (H) 70 - 99 mg/dL    Comment: Glucose reference range applies only to samples taken after fasting for at least 8 hours.  Basic metabolic panel     Status: Abnormal   Collection Time: 12/23/19  3:39 AM  Result Value Ref Range   Sodium 137 135 - 145 mmol/L   Potassium 2.9 (L) 3.5 - 5.1 mmol/L   Chloride 96 (L) 98 - 111 mmol/L   CO2 32 22 -  32 mmol/L   Glucose, Bld 90 70 - 99 mg/dL    Comment: Glucose reference range applies only to samples taken after fasting for at least 8 hours.   BUN 6 6 - 20 mg/dL   Creatinine, Ser 0.62 0.44 - 1.00 mg/dL   Calcium 8.4 (L) 8.9 - 10.3 mg/dL   GFR, Estimated >60 >60 mL/min    Comment: (NOTE) Calculated using the CKD-EPI Creatinine Equation (2021)    Anion gap 9 5 - 15    Comment: Performed at [facility], [place] [address] [place], [place] [address]  CBC with Differential/Platelet     Status: Abnormal   Collection Time: 12/23/19  3:39 AM  Result Value Ref Range   WBC 3.9 (L) 4.0 - 10.5 K/uL   RBC 3.06 (L) 3.87 - 5.11 MIL/uL   Hemoglobin 9.9 (L) 12.0 - 15.0 g/dL   HCT 30.9 (L) 36 - 46 %   MCV 101.0 (H) 80.0 - 100.0 fL   MCH 32.4 26.0 - 34.0 pg   MCHC 32.0 30.0 - 36.0 g/dL   RDW 14.4 11.5 - 15.5 %   Platelets 204 150 - 400 K/uL   nRBC 0.0 0.0 - 0.2 %   Neutrophils Relative % 76 %   Neutro Abs 3.0 1.7 - 7.7 K/uL   Lymphocytes Relative 7 %   Lymphs Abs 0.3 (L) 0.7 - 4.0 K/uL   Monocytes Relative 13 %   Monocytes Absolute 0.5 0.1 - 1.0 K/uL   Eosinophils Relative 2 %   Eosinophils Absolute 0.1 0.0 - 0.5 K/uL   Basophils Relative 1 %   Basophils Absolute 0.0 0.0 - 0.1 K/uL   Immature Granulocytes 1 %   Abs Immature Granulocytes 0.05 0.00 - 0.07 K/uL    Comment: Performed at [facility], [place] [address], [place], [place] [address]  Magnesium     Status: None   Collection  Time: 12/23/19  3:39 AM  Result Value Ref Range   Magnesium 2.1 1.7 - 2.4 mg/dL    Comment: Performed at [facility], [place] [address], [place], [place] [address]  Glucose, capillary     Status: None   Collection Time: 12/23/19  8:01 AM  Result Value Ref Range   Glucose-Capillary 88 70 - 99 mg/dL    Comment: Glucose reference range applies only to samples taken after fasting for at least 8 hours.  Glucose, capillary     Status: Abnormal   Collection Time: 12/23/19 11:55 AM  Result Value Ref Range   Glucose-Capillary 121 (H) 70 - 99 mg/dL    Comment: Glucose reference range applies only to samples taken after fasting for at least 8 hours.    CT ABDOMEN PELVIS W CONTRAST  Result Date: 12/22/2019 CLINICAL DATA:  Pyelonephritis complicated urinary tract infection. History of lung cancer with radiation treatment. History of celiac artery aneurysm. LOWER back pain for 6-8 weeks. EXAM: CT ABDOMEN AND PELVIS WITH CONTRAST TECHNIQUE: Multidetector CT imaging of the abdomen and pelvis was performed using the standard protocol following bolus administration of intravenous contrast. CONTRAST:  167m OMNIPAQUE IOHEXOL 300 MG/ML  SOLN COMPARISON:  Plain film 12/22/2019 and CT 10/01/2019, 10/30/2010 FINDINGS: Lower chest: Are stable, chronic changes in the lungs, involving the lingula, RIGHT middle lobe, and both LOWER lobes. In these regions there is diffuse ground-glass opacity, bronchiolectasis, and honeycomb changes. No pleural effusions. Heart size is normal. Hepatobiliary: No focal liver abnormality is seen. No radiopaque gallstones, biliary dilatation, or  pericholecystic inflammatory changes. Pancreas: Unremarkable. No pancreatic ductal dilatation or surrounding inflammatory changes. Spleen: Normal in size without focal abnormality. Adrenals/Urinary Tract: Adrenal glands are normal. Kidneys are symmetric in size and enhancement. Appropriate excretion bilaterally. There is a 1.2  centimeter cyst in the LATERAL UPPER pole region of the LEFT kidney. No solid mass, hydronephrosis, or perinephric stranding. The ureters are unremarkable. The bladder and visualized portion of the urethra are normal. Stomach/Bowel: Stomach is unremarkable. There is artifact in the region of the distal stomach and duodenal from vascular coils. Small bowel loops are unremarkable. The appendix is mildly thickened, measuring 10 millimeters on image 45 of series 7. There is no periappendiceal inflammatory change or fluid. Vascular/Lymphatic: Abdominal aorta is partially calcified. Celiac stent graft. Numerous vascular coils along the MEDIAL wall of the stomach and duodenum. No adenopathy. Reproductive: Uterus is present.  No adnexal mass. Other: No free pelvic fluid. Subcutaneous injection sites in the anterior abdominal wall. Musculoskeletal: No acute or significant osseous findings. IMPRESSION: 1. No evidence for urinary tract obstruction or pyelonephritis. 2. Mildly thickened appendix without periappendiceal inflammatory change or fluid. Findings raise a question for early acute appendicitis. Recommend correlation with targeted physical exam. 3. Stable chronic changes in the lungs. 4. Celiac stent graft.  Vascular coils in the RIGHT UPPER QUADRANT. 5. Aortic Atherosclerosis (ICD10-I70.0). These results will be called to the ordering clinician or representative by the Radiologist Assistant, and communication documented in the PACS or [employer]. Electronically Signed   By: Nolon Nations M.D.   On: 12/22/2019 16:45    Review of Systems  Gastrointestinal: Positive for abdominal pain, diarrhea and nausea. Negative for abdominal distention.  Musculoskeletal: Positive for back pain.  All other systems reviewed and are negative.  Blood pressure 110/70, pulse 82, temperature 98.6 F (37 C), temperature source Oral, resp. rate 20, height 5' 2.01" (1.575 m), weight 90.3 kg, SpO2 97 %. Physical  Exam Constitutional:      Appearance: Normal appearance.  Cardiovascular:     Rate and Rhythm: Normal rate and regular rhythm.  Pulmonary:     Effort: Pulmonary effort is normal.     Breath sounds: Normal breath sounds.  Abdominal:     General: There is no distension.     Tenderness: There is abdominal tenderness (mild suprapubic and rlq).  Skin:    General: Skin is warm and dry.  Neurological:     Mental Status: She is alert.     Assessment/Plan: UTI ? Appendicitis Appendix is mildly dilated on ct scan and she does have pain at that site. Also with a uti. These symptoms have been going on for weeks and she states that abdominal pain has been present for two weeks. I certainly would expect something more if she has appendicitis.  I would continue merrem especially given her PF. I think even is she has appendicitis would try to treat nonoperatively and reserve surgery for failure given risk with general anesthesia.  We will follow with you. Might be reasonable to repeat ct scan at some point with po contrast. I discussed plan with patient and her husband  Rolm Bookbinder 12/23/2019, 1:53 PM

## 2019-12-23 NOTE — Assessment & Plan Note (Addendum)
-  patient has a degree of chronic pains at home and she has a hard time delineating how different pain is currently compared to her usual chronic pains.  Regardless, she does appear to have an increased amount of tenderness in her right lower quadrant on 10/31. Also had some N/V on 10/30. - CT abdomen/pelvis on 12/22/2019 reveals a thickened appendix but no overt signs of acute appendicitis however there was concern for possible early appendicitis on the CT -will ask for surgery evaluation due to these findings -If she is in need of surgery, will need pulmonology involvement given her underlying level of oxygen demand, fibrosis, and chronic steroid - for now, suspicion is mild appendicitis if present and preference is to try and treat medically with abx (meropenem as above) and repeat CT in a couple days to re-evaluate. If does need surgery, will need to consult pulm prior to for clearance

## 2019-12-24 ENCOUNTER — Telehealth (HOSPITAL_COMMUNITY): Payer: Self-pay | Admitting: Family Medicine

## 2019-12-24 LAB — URINALYSIS, ROUTINE W REFLEX MICROSCOPIC
Bilirubin Urine: NEGATIVE
Glucose, UA: NEGATIVE mg/dL
Hgb urine dipstick: NEGATIVE
Ketones, ur: 5 mg/dL — AB
Leukocytes,Ua: NEGATIVE
Nitrite: POSITIVE — AB
Protein, ur: NEGATIVE mg/dL
Specific Gravity, Urine: 1.01 (ref 1.005–1.030)
pH: 6 (ref 5.0–8.0)

## 2019-12-24 LAB — CBC WITH DIFFERENTIAL/PLATELET
Abs Immature Granulocytes: 0.07 10*3/uL (ref 0.00–0.07)
Basophils Absolute: 0 10*3/uL (ref 0.0–0.1)
Basophils Relative: 1 %
Eosinophils Absolute: 0.1 10*3/uL (ref 0.0–0.5)
Eosinophils Relative: 3 %
HCT: 32 % — ABNORMAL LOW (ref 36.0–46.0)
Hemoglobin: 10 g/dL — ABNORMAL LOW (ref 12.0–15.0)
Immature Granulocytes: 2 %
Lymphocytes Relative: 8 %
Lymphs Abs: 0.3 10*3/uL — ABNORMAL LOW (ref 0.7–4.0)
MCH: 32.1 pg (ref 26.0–34.0)
MCHC: 31.3 g/dL (ref 30.0–36.0)
MCV: 102.6 fL — ABNORMAL HIGH (ref 80.0–100.0)
Monocytes Absolute: 0.4 10*3/uL (ref 0.1–1.0)
Monocytes Relative: 12 %
Neutro Abs: 2.7 10*3/uL (ref 1.7–7.7)
Neutrophils Relative %: 74 %
Platelets: 216 10*3/uL (ref 150–400)
RBC: 3.12 MIL/uL — ABNORMAL LOW (ref 3.87–5.11)
RDW: 14.6 % (ref 11.5–15.5)
WBC: 3.7 10*3/uL — ABNORMAL LOW (ref 4.0–10.5)
nRBC: 0 % (ref 0.0–0.2)

## 2019-12-24 LAB — BASIC METABOLIC PANEL
Anion gap: 8 (ref 5–15)
BUN: 5 mg/dL — ABNORMAL LOW (ref 6–20)
CO2: 29 mmol/L (ref 22–32)
Calcium: 8.5 mg/dL — ABNORMAL LOW (ref 8.9–10.3)
Chloride: 101 mmol/L (ref 98–111)
Creatinine, Ser: 0.57 mg/dL (ref 0.44–1.00)
GFR, Estimated: >60 mL/min (ref >60)
Glucose, Bld: 97 mg/dL (ref 70–99)
Potassium: 3.8 mmol/L (ref 3.5–5.1)
Sodium: 138 mmol/L (ref 135–145)

## 2019-12-24 LAB — MAGNESIUM: Magnesium: 2.1 mg/dL (ref 1.7–2.4)

## 2019-12-24 LAB — GLUCOSE, CAPILLARY
Glucose-Capillary: 84 mg/dL (ref 70–99)
Glucose-Capillary: 128 mg/dL — ABNORMAL HIGH (ref 70–99)
Glucose-Capillary: 191 mg/dL — ABNORMAL HIGH (ref 70–99)

## 2019-12-24 NOTE — Progress Notes (Addendum)
Subjective: CC: Patient reports that her abdominal pain in her RLQ is not present at rest and only occurs with movement or palpation. She reports her pain is no better or worse than yesterday. She denies n/v. Tolerating diet. Patients husband at bedside.   Objective: Vital signs in last 24 hours: Temp:  [98 F (36.7 C)-98.6 F (37 C)] 98.2 F (36.8 C) (11/01 0611) Pulse Rate:  [82-88] 86 (11/01 0611) Resp:  [20] 20 (11/01 0611) BP: (110-117)/(65-81) 113/81 (11/01 0611) SpO2:  [97 %-99 %] 99 % (11/01 0611) Last BM Date: 12/23/19  Intake/Output from previous day: 10/31 0701 - 11/01 0700 In: 1153.3 [I.V.:903.3; IV Piggyback:250] Out: -  Intake/Output this shift: No intake/output data recorded.  PE: Gen:  Alert, NAD, pleasant Card:  RRR Pulm:  CTAB, no W/R/R, effort normal, on o2 Abd: Soft, ND, mild suprapubic and RLQ tenderness, +BS Ext:  No LE edema  Psych: A&Ox3  Skin: no rashes noted, warm and dry  Lab Results:  Recent Labs    12/23/19 0339 12/24/19 0510  WBC 3.9* 3.7*  HGB 9.9* 10.0*  HCT 30.9* 32.0*  PLT 204 216   BMET Recent Labs    12/23/19 0339 12/24/19 0510  NA 137 138  K 2.9* 3.8  CL 96* 101  CO2 32 29  GLUCOSE 90 97  BUN 6 5*  CREATININE 0.62 0.57  CALCIUM 8.4* 8.5*   PT/INR No results for input(s): LABPROT, INR in the last 72 hours. CMP     Component Value Date/Time   NA 138 12/24/2019 0510   NA 136 02/13/2018 1118   K 3.8 12/24/2019 0510   CL 101 12/24/2019 0510   CO2 29 12/24/2019 0510   GLUCOSE 97 12/24/2019 0510   BUN 5 (L) 12/24/2019 0510   BUN 11 02/13/2018 1118   CREATININE 0.57 12/24/2019 0510   CALCIUM 8.5 (L) 12/24/2019 0510   PROT 5.6 (L) 12/22/2019 0315   PROT 6.0 02/13/2018 1118   ALBUMIN 3.2 (L) 12/22/2019 0315   ALBUMIN 4.2 02/13/2018 1118   AST 17 12/22/2019 0315   ALT 16 12/22/2019 0315   ALKPHOS 35 (L) 12/22/2019 0315   BILITOT 0.8 12/22/2019 0315   BILITOT 0.4 02/13/2018 1118   GFRNONAA >60  12/24/2019 0510   GFRAA >60 01/17/2019 0805   Lipase  No results found for: LIPASE     Studies/Results: CT ABDOMEN PELVIS W CONTRAST  Result Date: 12/22/2019 CLINICAL DATA:  Pyelonephritis complicated urinary tract infection. History of lung cancer with radiation treatment. History of celiac artery aneurysm. LOWER back pain for 6-8 weeks. EXAM: CT ABDOMEN AND PELVIS WITH CONTRAST TECHNIQUE: Multidetector CT imaging of the abdomen and pelvis was performed using the standard protocol following bolus administration of intravenous contrast. CONTRAST:  154m OMNIPAQUE IOHEXOL 300 MG/ML  SOLN COMPARISON:  Plain film 12/22/2019 and CT 10/01/2019, 10/30/2010 FINDINGS: Lower chest: Are stable, chronic changes in the lungs, involving the lingula, RIGHT middle lobe, and both LOWER lobes. In these regions there is diffuse ground-glass opacity, bronchiolectasis, and honeycomb changes. No pleural effusions. Heart size is normal. Hepatobiliary: No focal liver abnormality is seen. No radiopaque gallstones, biliary dilatation, or pericholecystic inflammatory changes. Pancreas: Unremarkable. No pancreatic ductal dilatation or surrounding inflammatory changes. Spleen: Normal in size without focal abnormality. Adrenals/Urinary Tract: Adrenal glands are normal. Kidneys are symmetric in size and enhancement. Appropriate excretion bilaterally. There is a 1.2 centimeter cyst in the LATERAL UPPER pole region of the LEFT kidney. No solid  mass, hydronephrosis, or perinephric stranding. The ureters are unremarkable. The bladder and visualized portion of the urethra are normal. Stomach/Bowel: Stomach is unremarkable. There is artifact in the region of the distal stomach and duodenal from vascular coils. Small bowel loops are unremarkable. The appendix is mildly thickened, measuring 10 millimeters on image 45 of series 7. There is no periappendiceal inflammatory change or fluid. Vascular/Lymphatic: Abdominal aorta is partially  calcified. Celiac stent graft. Numerous vascular coils along the MEDIAL wall of the stomach and duodenum. No adenopathy. Reproductive: Uterus is present.  No adnexal mass. Other: No free pelvic fluid. Subcutaneous injection sites in the anterior abdominal wall. Musculoskeletal: No acute or significant osseous findings. IMPRESSION: 1. No evidence for urinary tract obstruction or pyelonephritis. 2. Mildly thickened appendix without periappendiceal inflammatory change or fluid. Findings raise a question for early acute appendicitis. Recommend correlation with targeted physical exam. 3. Stable chronic changes in the lungs. 4. Celiac stent graft.  Vascular coils in the RIGHT UPPER QUADRANT. 5. Aortic Atherosclerosis (ICD10-I70.0). These results will be called to the ordering clinician or representative by the Radiologist Assistant, and communication documented in the PACS or [employer]. Electronically Signed   By: Nolon Nations M.D.   On: 12/22/2019 16:45    Anti-infectives: Anti-infectives (From admission, onward)   Start     Dose/Rate Route Frequency Ordered Stop   12/21/19 2230  sulfamethoxazole-trimethoprim (BACTRIM) 400-80 MG per tablet 1 tablet        1 tablet Oral Every M-W-F 12/21/19 2222     12/21/19 2100  meropenem (MERREM) 1 g in sodium chloride 0.9 % 100 mL IVPB        1 g 200 mL/hr over 30 Minutes Intravenous Every 8 hours 12/21/19 2019         Assessment/Plan HTN Thyroid disease PF - on o2 and chronic prednisone  DM2 UTI - Proteus ESBL - Per TRH -   Possible Appendicitis - Patient with mildly dilated appendix on CT, w/ tenderness to palpation, but also found to have a UTI. Given the duration of her symptoms, I would expect more significant findings on her CT if she had appendicitis - She is currently on Merrem for her UTI. This should cover if pateint had appendicitis. I think it is reasonable to give a trial of non-operative tx, especially with patients increased risk for  anesthesia with her PF.  - Could consider repeating a CT in a few days with PO contrast - We will follow with you  FEN - Reg VTE - SCDs, heparin  ID - Merrem, afebrile, WBC 3.7   LOS: 2 days    Jillyn Ledger , [facility] 12/24/2019, 9:04 AM Please see Amion for pager number during day hours 7:00am-4:30pm

## 2019-12-24 NOTE — Telephone Encounter (Signed)
She was admitted

## 2019-12-24 NOTE — Evaluation (Signed)
Physical Therapy Evaluation-1x Patient Details Name: Tasha Ballard MRN: [id number] DOB: 1971/01/28 Today's Date: 12/24/2019   History of Present Illness  49 yo female admitted with complicated UTI, abd pain. Hx of advanced pulmonary fibrosis, DM, vitamin D deficiency, back pain.  Clinical Impression  On eval, pt was Mod Ind with mobility. She walked ~150 feet around the unit (she pushed IV pole but wasn't reliant on it). O2 94% on 7L at rest, 83% on 8L with ambulation. Pt recovered to 90% on 7L, within 1 minute, with seated rest. Per pt report, she wears 8-10L with activity. Pt does not have any acute PT needs at this time. Recommend ambulation in hallway with nursing or family supervision/assistance. Recommend cardiopulmonary rehab once pt feels she is able to tolerate the program. 1x eval. Will sign off.     Follow Up Recommendations  (cardiopulmonary rehab once able to resume)    Equipment Recommendations  None recommended by PT    Recommendations for Other Services       Precautions / Restrictions Precautions Precaution Comments: O2 dep. Monitor O2 levels Restrictions Weight Bearing Restrictions: No      Mobility  Bed Mobility Overal bed mobility: Modified Independent                  Transfers Overall transfer level: Modified independent                  Ambulation/Gait Ambulation/Gait assistance: Modified independent (Device/Increase time) Gait Distance (Feet): 150 Feet Assistive device: IV Pole;None Gait Pattern/deviations: Step-through pattern;Decreased stride length     General Gait Details: No LOB. Pt denied lightheadedness. O2 83% on 8L with ambulation. Dyspnea 3/4.  Stairs            Wheelchair Mobility    Modified Rankin (Stroke Patients Only)       Balance Overall balance assessment: No apparent balance deficits (not formally assessed)                                           Pertinent Vitals/Pain Pain  Assessment: No/denies pain    Home Living Family/patient expects to be discharged to:: Private residence Living Arrangements: Spouse/significant other Available Help at Discharge: Family Type of Home: House         Home Equipment: None      Prior Function Level of Independence: Independent               Hand Dominance        Extremity/Trunk Assessment   Upper Extremity Assessment Upper Extremity Assessment: Overall WFL for tasks assessed    Lower Extremity Assessment Lower Extremity Assessment: Overall WFL for tasks assessed    Cervical / Trunk Assessment Cervical / Trunk Assessment: Normal  Communication   Communication: No difficulties  Cognition Arousal/Alertness: Awake/alert Behavior During Therapy: WFL for tasks assessed/performed Overall Cognitive Status: Within Functional Limits for tasks assessed                                        General Comments      Exercises     Assessment/Plan    PT Assessment Patent does not need any further PT services (Recommend ambulation in hallway with nursing or family supervision/assistance)  PT Problem List  PT Treatment Interventions      PT Goals (Current goals can be found in the Care Plan section)  Acute Rehab PT Goals Patient Stated Goal: to get better PT Goal Formulation: All assessment and education complete, DC therapy    Frequency     Barriers to discharge        Co-evaluation               AM-PAC PT "6 Clicks" Mobility  Outcome Measure Help needed turning from your back to your side while in a flat bed without using bedrails?: None Help needed moving from lying on your back to sitting on the side of a flat bed without using bedrails?: None Help needed moving to and from a bed to a chair (including a wheelchair)?: None Help needed standing up from a chair using your arms (e.g., wheelchair or bedside chair)?: None Help needed to walk in hospital room?:  None Help needed climbing 3-5 steps with a railing? : None 6 Click Score: 24    End of Session Equipment Utilized During Treatment: Oxygen Activity Tolerance: Patient tolerated treatment well Patient left: in bed;with call bell/phone within reach;with family/visitor present        Time: 8850-2774 PT Time Calculation (min) (ACUTE ONLY): 13 min   Charges:   PT Evaluation $PT Eval Low Complexity: [place], PT Acute Rehabilitation  Office: [phone] Pager: [phone]

## 2019-12-24 NOTE — Progress Notes (Signed)
PROGRESS NOTE    Tasha Ballard   ZHY:[id number]  DOB: 1970-05-10  DOA: 12/21/2019     2  PCP: [name], MD  CC: dysuria  Hospital Course: Tasha Ballard  is a 49 y.o. female, with history of vitamin D deficiency, thyroid disease, advanced pulmonary fibrosis, hypertension, diabetes mellitus type 2 without complication who presented to the ED with a chief complaint of dysuria.    Patient is immunosuppressed since having chemo for pulmonary fibrosis.  She is on prophylactic Bactrim 3 times a week, and prednisone daily.  Her last chemo treatment was 1 year ago.  She has a right chest port in place as there is now a suspicious nodule on her chest imaging.  Patient reports that since she had chemo a year ago she has started having UTIs.  This is her third episode.   She reports that she called her pulmonologist and was prescribed 5 or 6 days of Cipro.  10 days later she started having dysuria again.  She called her PCP who doubled her Cipro dosage to 500 mg twice daily, and ordered a urine culture.  Urine culture came back and showed ESBL Proteus possible sens to Augmentin. She was prescribed 10 days of Augmentin.  She reports that that made her constipated and gave her yeast infection.  She was then given Diflucan.  She also started experiencing hemorrhoids due to the constipation, and had to use suppositories.  She finished the Augmentin and 5 or 6 days later she started having dysuria again.   She had a repeat urine culture that showed an increase in the Proteus mirabilis from 50,000 to greater than 100,000 colony-forming units.   Patient does admit to associated fevers and chills however also describes some perimenopausal symptoms.  She reports that she has had severe back pain on the right side at the level of her kidney for several weeks.  She also reports lower abdominal pain.  Both of these pains are worse right before she voids.  Patient is nauseous and reports that her last  normal meal was yesterday, but she is not often eating normal meals.  She often forgets to eat, or is nauseous and throws back up whenever she is eating.   In the ED Temperature 97.8, heart rate 110, respiratory rate 18, blood pressure 136/98, satting at 97% on 10 L nasal cannula UA notable for positive nitrites, negative leukocyte esterase, and no bacteria seen; 0-5 WBC.  She was started on meropenem and admitted for further treatment and monitoring.  She also underwent CT abdomen/pelvis for evaluation of perinephric abscess/pyelonephritis given her lingering symptoms and outpatient failure of oral medications.  This was negative for obstruction or pyelonephritis. She was found to have a thickened appendix without periappendiceal inflammation or fluid but still some concern for possible early acute appendicitis. She was more tender in her right lower quadrant on 12/23/2019, therefore surgery was consulted for further evaluation. Due to increased risk of surgery and potential of only mild appendicitis at this time, decision was made to continue treatment with antibiotics with repeat CT to re-eval.    Interval History:  No events overnight.  Pain seems to have improved some since yesterday.  Still some ongoing nausea but no further vomiting.  She is amenable with the plan of treating with antibiotics for now and repeat the CT when needed. Husband bedside as well this morning.  Old records reviewed in assessment of this patient  ROS: Constitutional: positive for chills, fatigue, fevers  and malaise, Respiratory: negative for cough, Cardiovascular: negative for chest pain and Gastrointestinal: negative for abdominal pain  Assessment & Plan: * Complicated UTI (urinary tract infection) - patient presented with chills, fatigue, flank/pelvic, and CVA tenderness - UCx growing Proteus ESBL. The MIC was considered too high for Augmentin outpatient, I suspect this is why she did not respond well.  Symptoms now have continued for approximately 3 weeks and she is s/p 3 courses of abx with increased colony count on last 2 urine cultures, suggesting untreated infection - now on meropenem; continue this for likely 7 day total course (likely can change to Ertapenem at discharge for daily dosing if able to arrange H/H or infusion center) - admission UCx "<10k colonies" - repeat UA on 11/1 for clearance - CT abd/pelvis to evaluate for abscess and/or pyelo: negative for abscess or pyelo on CT  Abdominal pain - patient has a degree of chronic pains at home and she has a hard time delineating how different pain is currently compared to her usual chronic pains.  Regardless, she does appear to have an increased amount of tenderness in her right lower quadrant on 10/31. Also had some N/V on 10/30. - CT abdomen/pelvis on 12/22/2019 reveals a thickened appendix but no overt signs of acute appendicitis however there was concern for possible early appendicitis on the CT -will ask for surgery evaluation due to these findings -If she is in need of surgery, will need pulmonology involvement given her underlying level of oxygen demand, fibrosis, and chronic steroid - for now, suspicion is mild appendicitis if present and preference is to try and treat medically with abx (meropenem as above) and repeat CT in a couple days to re-evaluate. If does need surgery, will need to consult pulm prior to for clearance  Chronic respiratory failure with hypoxia ([place]) -Patient on 10 L nasal cannula at baseline -Continue O2  Idiopathic interstitial pneumonia ([place]) -Follows closely with Dr. [name] -Continue Ofev and Esbriet -Continue chronic prednisone 10 mg daily; no indication for stress dosing at this time but will continue to monitor in setting of infection  Hypokalemia -Replete and recheck as needed  Hypothyroidism -Continue [place] Thyroid  Antimicrobials: Meropenem 12/21/2019>> present  DVT prophylaxis:  HSQ Code Status: Full Family Communication: Husband bedside Disposition Plan: Status is: Inpatient  Remains inpatient appropriate because:Ongoing active pain requiring inpatient pain management, Ongoing diagnostic testing needed not appropriate for outpatient work up, Unsafe d/c plan, IV treatments appropriate due to intensity of illness or inability to take PO and Inpatient level of care appropriate due to severity of illness   Dispo: The patient is from: Home              Anticipated d/c is to: Home              Anticipated d/c date is: > 3 days              Patient currently is not medically stable to d/c.  Objective: Blood pressure 113/81, pulse 86, temperature 98.2 F (36.8 C), temperature source Oral, resp. rate 20, height 5' 2.01" (1.575 m), weight 90.3 kg, SpO2 99 %.  Examination: General appearance: alert, cooperative and no distress Head: Normocephalic, without obvious abnormality, atraumatic Eyes: EOMI Lungs: clear to auscultation bilaterally  Chest: Right-sided port in place Heart: regular rate and rhythm and S1, S2 normal  Back: no further CVA TTP Abdomen: Still tender to deep palpation in right lower quadrant, no rebound or guarding.  Bowel sounds present Extremities:  trace LE edema Skin: mobility and turgor normal Neurologic: Grossly normal  Consultants:   Surgery  Procedures:   None  Data Reviewed: I have personally reviewed following labs and imaging studies Results for orders placed or performed during the hospital encounter of 12/21/19 (from the past 24 hour(s))  Glucose, capillary     Status: Abnormal   Collection Time: 12/23/19  4:38 PM  Result Value Ref Range   Glucose-Capillary 160 (H) 70 - 99 mg/dL  Basic metabolic panel     Status: Abnormal   Collection Time: 12/24/19  5:10 AM  Result Value Ref Range   Sodium 138 135 - 145 mmol/L   Potassium 3.8 3.5 - 5.1 mmol/L   Chloride 101 98 - 111 mmol/L   CO2 29 22 - 32 mmol/L   Glucose, Bld 97 70 - 99  mg/dL   BUN 5 (L) 6 - 20 mg/dL   Creatinine, Ser 0.57 0.44 - 1.00 mg/dL   Calcium 8.5 (L) 8.9 - 10.3 mg/dL   GFR, Estimated >60 >60 mL/min   Anion gap 8 5 - 15  CBC with Differential/Platelet     Status: Abnormal   Collection Time: 12/24/19  5:10 AM  Result Value Ref Range   WBC 3.7 (L) 4.0 - 10.5 K/uL   RBC 3.12 (L) 3.87 - 5.11 MIL/uL   Hemoglobin 10.0 (L) 12.0 - 15.0 g/dL   HCT 32.0 (L) 36 - 46 %   MCV 102.6 (H) 80.0 - 100.0 fL   MCH 32.1 26.0 - 34.0 pg   MCHC 31.3 30.0 - 36.0 g/dL   RDW 14.6 11.5 - 15.5 %   Platelets 216 150 - 400 K/uL   nRBC 0.0 0.0 - 0.2 %   Neutrophils Relative % 74 %   Neutro Abs 2.7 1.7 - 7.7 K/uL   Lymphocytes Relative 8 %   Lymphs Abs 0.3 (L) 0.7 - 4.0 K/uL   Monocytes Relative 12 %   Monocytes Absolute 0.4 0.1 - 1.0 K/uL   Eosinophils Relative 3 %   Eosinophils Absolute 0.1 0.0 - 0.5 K/uL   Basophils Relative 1 %   Basophils Absolute 0.0 0.0 - 0.1 K/uL   Immature Granulocytes 2 %   Abs Immature Granulocytes 0.07 0.00 - 0.07 K/uL  Magnesium     Status: None   Collection Time: 12/24/19  5:10 AM  Result Value Ref Range   Magnesium 2.1 1.7 - 2.4 mg/dL  Glucose, capillary     Status: None   Collection Time: 12/24/19  7:39 AM  Result Value Ref Range   Glucose-Capillary 84 70 - 99 mg/dL  Glucose, capillary     Status: Abnormal   Collection Time: 12/24/19 11:22 AM  Result Value Ref Range   Glucose-Capillary 128 (H) 70 - 99 mg/dL    Recent Results (from the past 240 hour(s))  Urine culture     Status: Abnormal   Collection Time: 12/21/19  6:44 PM   Specimen: Urine, Clean Catch  Result Value Ref Range Status   Specimen Description   Final    URINE, CLEAN CATCH Performed at [facility], [place] [address], [place], [place] [address]    Special Requests   Final    NONE Performed at [facility], [place] [address] [place], [place] [address]    Culture (A)  Final    <10,000 COLONIES/mL INSIGNIFICANT  GROWTH Performed at [facility] [address] [place], [place] [address]    Report  Status 12/23/2019 FINAL  Final  Blood culture (routine x 2)     Status: None (Preliminary result)   Collection Time: 12/21/19  9:40 PM   Specimen: BLOOD  Result Value Ref Range Status   Specimen Description   Final    BLOOD LEFT ANTECUBITAL Performed at [facility] [address] [place], [place] [address]    Special Requests   Final    BOTTLES DRAWN AEROBIC AND ANAEROBIC Blood Culture adequate volume Performed at [facility] [address] [name], [place] [address]    Culture   Final    NO GROWTH 2 DAYS Performed at [facility] [address] [place], [place] [address]    Report Status PENDING  Incomplete  Respiratory Panel by RT PCR (Flu A&B, Covid) - Nasopharyngeal Swab     Status: None   Collection Time: 12/21/19  9:50 PM   Specimen: Nasopharyngeal Swab  Result Value Ref Range Status   SARS Coronavirus 2 by RT PCR NEGATIVE NEGATIVE Final    Comment: (NOTE) SARS-CoV-2 target nucleic acids are NOT DETECTED.  The SARS-CoV-2 RNA is generally detectable in upper respiratoy specimens during the acute phase of infection. The lowest concentration of SARS-CoV-2 viral copies this assay can detect is 131 copies/mL. A negative result does not preclude SARS-Cov-2 infection and should not be used as the sole basis for treatment or other patient management decisions. A negative result may occur with  improper specimen collection/handling, submission of specimen other than nasopharyngeal swab, presence of viral mutation(s) within the areas targeted by this assay, and inadequate number of viral copies (<131 copies/mL). A negative result must be combined with clinical observations, patient history, and epidemiological information. The expected result is Negative.  Fact Sheet for Patients:  PinkCheek.be  Fact  Sheet for Healthcare Providers:  GravelBags.it  This test is no t yet approved or cleared by the [place] FDA and  has been authorized for detection and/or diagnosis of SARS-CoV-2 by FDA under an Emergency Use Authorization (EUA). This EUA will remain  in effect (meaning this test can be used) for the duration of the COVID-19 declaration under Section 564(b)(1) of the Act, 21 U.S.C. section 360bbb-3(b)(1), unless the authorization is terminated or revoked sooner.     Influenza A by PCR NEGATIVE NEGATIVE Final   Influenza B by PCR NEGATIVE NEGATIVE Final    Comment: (NOTE) The Xpert Xpress SARS-CoV-2/FLU/RSV assay is intended as an aid in  the diagnosis of influenza from Nasopharyngeal swab specimens and  should not be used as a sole basis for treatment. Nasal washings and  aspirates are unacceptable for Xpert Xpress SARS-CoV-2/FLU/RSV  testing.  Fact Sheet for Patients: PinkCheek.be  Fact Sheet for Healthcare Providers: GravelBags.it  This test is not yet approved or cleared by the [place] FDA and  has been authorized for detection and/or diagnosis of SARS-CoV-2 by  FDA under an Emergency Use Authorization (EUA). This EUA will remain  in effect (meaning this test can be used) for the duration of the  Covid-19 declaration under Section 564(b)(1) of the Act, 21  U.S.C. section 360bbb-3(b)(1), unless the authorization is  terminated or revoked. Performed at [facility], [place] [address] [place], [place] [address]   Blood culture (routine x 2)     Status: None (Preliminary result)   Collection Time: 12/21/19  9:50 PM   Specimen: BLOOD  Result Value Ref Range Status   Specimen Description   Final  BLOOD RIGHT ANTECUBITAL Performed at [facility] [address] [place], [place] [address]    Special Requests   Final    BOTTLES DRAWN AEROBIC  AND ANAEROBIC Blood Culture adequate volume Performed at [facility] [address], [place], [place] [address]    Culture   Final    NO GROWTH 2 DAYS Performed at [facility] [address] [place], [place] [address]    Report Status PENDING  Incomplete     Radiology Studies: CT ABDOMEN PELVIS W CONTRAST  Result Date: 12/22/2019 CLINICAL DATA:  Pyelonephritis complicated urinary tract infection. History of lung cancer with radiation treatment. History of celiac artery aneurysm. LOWER back pain for 6-8 weeks. EXAM: CT ABDOMEN AND PELVIS WITH CONTRAST TECHNIQUE: Multidetector CT imaging of the abdomen and pelvis was performed using the standard protocol following bolus administration of intravenous contrast. CONTRAST:  117m OMNIPAQUE IOHEXOL 300 MG/ML  SOLN COMPARISON:  Plain film 12/22/2019 and CT 10/01/2019, 10/30/2010 FINDINGS: Lower chest: Are stable, chronic changes in the lungs, involving the lingula, RIGHT middle lobe, and both LOWER lobes. In these regions there is diffuse ground-glass opacity, bronchiolectasis, and honeycomb changes. No pleural effusions. Heart size is normal. Hepatobiliary: No focal liver abnormality is seen. No radiopaque gallstones, biliary dilatation, or pericholecystic inflammatory changes. Pancreas: Unremarkable. No pancreatic ductal dilatation or surrounding inflammatory changes. Spleen: Normal in size without focal abnormality. Adrenals/Urinary Tract: Adrenal glands are normal. Kidneys are symmetric in size and enhancement. Appropriate excretion bilaterally. There is a 1.2 centimeter cyst in the LATERAL UPPER pole region of the LEFT kidney. No solid mass, hydronephrosis, or perinephric stranding. The ureters are unremarkable. The bladder and visualized portion of the urethra are normal. Stomach/Bowel: Stomach is unremarkable. There is artifact in the region of the distal stomach and duodenal from vascular coils. Small bowel loops are  unremarkable. The appendix is mildly thickened, measuring 10 millimeters on image 45 of series 7. There is no periappendiceal inflammatory change or fluid. Vascular/Lymphatic: Abdominal aorta is partially calcified. Celiac stent graft. Numerous vascular coils along the MEDIAL wall of the stomach and duodenum. No adenopathy. Reproductive: Uterus is present.  No adnexal mass. Other: No free pelvic fluid. Subcutaneous injection sites in the anterior abdominal wall. Musculoskeletal: No acute or significant osseous findings. IMPRESSION: 1. No evidence for urinary tract obstruction or pyelonephritis. 2. Mildly thickened appendix without periappendiceal inflammatory change or fluid. Findings raise a question for early acute appendicitis. Recommend correlation with targeted physical exam. 3. Stable chronic changes in the lungs. 4. Celiac stent graft.  Vascular coils in the RIGHT UPPER QUADRANT. 5. Aortic Atherosclerosis (ICD10-I70.0). These results will be called to the ordering clinician or representative by the Radiologist Assistant, and communication documented in the PACS or [employer] Electronically Signed   By: ENolon NationsM.D.   On: 12/22/2019 16:45   CT ABDOMEN PELVIS W CONTRAST  Final Result      Scheduled Meds:  carvedilol  6.25 mg Oral BID WC   Chlorhexidine Gluconate Cloth  6 each Topical Daily   famotidine  40 mg Oral Daily   gabapentin  100 mg Oral BID   heparin  5,000 Units Subcutaneous Q8H   metroNIDAZOLE  1 Applicatorful Vaginal QHS   montelukast  10 mg Oral QPC supper   Nintedanib  150 mg Oral BID   phenazopyridine  200 mg Oral TID WC   Pirfenidone  1 tablet Oral TID WC   predniSONE  10 mg Oral Q  breakfast   sodium chloride flush  10-40 mL Intracatheter Q12H   spironolactone  100 mg Oral Daily   sulfamethoxazole-trimethoprim  1 tablet Oral Q M,W,F   thyroid  90 mg Oral QAC breakfast   PRN Meds: acetaminophen **OR** acetaminophen, albuterol, LORazepam,  methocarbamol (ROBAXIN) IV, ondansetron **OR** ondansetron (ZOFRAN) IV, oxyCODONE, sodium chloride flush Continuous Infusions:  sodium chloride 50 mL/hr at 12/24/19 0529   meropenem (MERREM) IV 1 g (12/24/19 1230)   methocarbamol (ROBAXIN) IV 500 mg (12/23/19 2152)     LOS: 2 days  Time spent: Greater than 50% of the 35 minute visit was spent in counseling/coordination of care for the patient as laid out in the A&P.   Dwyane Dee, MD [facility] 12/24/2019, 1:00 PM

## 2019-12-25 ENCOUNTER — Ambulatory Visit: Payer: BC Managed Care – PPO | Admitting: Internal Medicine

## 2019-12-25 ENCOUNTER — Encounter (HOSPITAL_COMMUNITY): Payer: Self-pay

## 2019-12-25 ENCOUNTER — Encounter (HOSPITAL_COMMUNITY): Payer: Self-pay | Admitting: *Deleted

## 2019-12-25 DIAGNOSIS — R197 Diarrhea, unspecified: Secondary | ICD-10-CM

## 2019-12-25 DIAGNOSIS — N390 Urinary tract infection, site not specified: Secondary | ICD-10-CM | POA: Diagnosis present

## 2019-12-25 DIAGNOSIS — J849 Interstitial pulmonary disease, unspecified: Secondary | ICD-10-CM

## 2019-12-25 DIAGNOSIS — R1030 Lower abdominal pain, unspecified: Secondary | ICD-10-CM

## 2019-12-25 DIAGNOSIS — J84112 Idiopathic pulmonary fibrosis: Secondary | ICD-10-CM | POA: Insufficient documentation

## 2019-12-25 DIAGNOSIS — B372 Candidiasis of skin and nail: Secondary | ICD-10-CM

## 2019-12-25 DIAGNOSIS — D512 Transcobalamin II deficiency: Secondary | ICD-10-CM

## 2019-12-25 DIAGNOSIS — B964 Proteus (mirabilis) (morganii) as the cause of diseases classified elsewhere: Secondary | ICD-10-CM | POA: Diagnosis present

## 2019-12-25 DIAGNOSIS — D649 Anemia, unspecified: Secondary | ICD-10-CM | POA: Diagnosis present

## 2019-12-25 DIAGNOSIS — N3001 Acute cystitis with hematuria: Principal | ICD-10-CM

## 2019-12-25 DIAGNOSIS — E876 Hypokalemia: Secondary | ICD-10-CM

## 2019-12-25 DIAGNOSIS — E038 Other specified hypothyroidism: Secondary | ICD-10-CM

## 2019-12-25 LAB — OCCULT BLOOD X 1 CARD TO LAB, STOOL: Fecal Occult Bld: NEGATIVE

## 2019-12-25 LAB — CBC WITH DIFFERENTIAL/PLATELET
Abs Immature Granulocytes: 0.13 10*3/uL — ABNORMAL HIGH (ref 0.00–0.07)
Basophils Absolute: 0 10*3/uL (ref 0.0–0.1)
Basophils Relative: 1 %
Eosinophils Absolute: 0.1 10*3/uL (ref 0.0–0.5)
Eosinophils Relative: 3 %
HCT: 30.5 % — ABNORMAL LOW (ref 36.0–46.0)
Hemoglobin: 9.4 g/dL — ABNORMAL LOW (ref 12.0–15.0)
Immature Granulocytes: 4 %
Lymphocytes Relative: 9 %
Lymphs Abs: 0.3 10*3/uL — ABNORMAL LOW (ref 0.7–4.0)
MCH: 31.9 pg (ref 26.0–34.0)
MCHC: 30.8 g/dL (ref 30.0–36.0)
MCV: 103.4 fL — ABNORMAL HIGH (ref 80.0–100.0)
Monocytes Absolute: 0.5 10*3/uL (ref 0.1–1.0)
Monocytes Relative: 14 %
Neutro Abs: 2.5 10*3/uL (ref 1.7–7.7)
Neutrophils Relative %: 69 %
Platelets: 202 10*3/uL (ref 150–400)
RBC: 2.95 MIL/uL — ABNORMAL LOW (ref 3.87–5.11)
RDW: 15 % (ref 11.5–15.5)
WBC: 3.6 10*3/uL — ABNORMAL LOW (ref 4.0–10.5)
nRBC: 0 % (ref 0.0–0.2)

## 2019-12-25 LAB — BASIC METABOLIC PANEL
Anion gap: 8 (ref 5–15)
BUN: <5 mg/dL — ABNORMAL LOW (ref 6–20)
CO2: 27 mmol/L (ref 22–32)
Calcium: 8.4 mg/dL — ABNORMAL LOW (ref 8.9–10.3)
Chloride: 103 mmol/L (ref 98–111)
Creatinine, Ser: 0.61 mg/dL (ref 0.44–1.00)
GFR, Estimated: >60 mL/min (ref >60)
Glucose, Bld: 98 mg/dL (ref 70–99)
Potassium: 3.8 mmol/L (ref 3.5–5.1)
Sodium: 138 mmol/L (ref 135–145)

## 2019-12-25 LAB — GLUCOSE, CAPILLARY
Glucose-Capillary: 92 mg/dL (ref 70–99)
Glucose-Capillary: 124 mg/dL — ABNORMAL HIGH (ref 70–99)
Glucose-Capillary: 145 mg/dL — ABNORMAL HIGH (ref 70–99)
Glucose-Capillary: 118 mg/dL — ABNORMAL HIGH (ref 70–99)

## 2019-12-25 LAB — MAGNESIUM: Magnesium: 2.1 mg/dL (ref 1.7–2.4)

## 2019-12-25 MED ORDER — MICONAZOLE NITRATE POWD: Freq: Two times a day (BID) | Status: DC

## 2019-12-25 MED ORDER — SACCHAROMYCES BOULARDII 250 MG PO CAPS
250.0000 mg | ORAL_CAPSULE | Freq: Two times a day (BID) | ORAL | Status: DC
  Administered 2019-12-25 – 2019-12-28 (×7): 250 mg via ORAL
  Filled 2019-12-25 (×8): qty 1

## 2019-12-25 NOTE — Progress Notes (Signed)
Subjective: CC: Pain better, having a lot of diarrhea  Objective: Vital signs in last 24 hours: Temp:  [97.7 F (36.5 C)-97.8 F (36.6 C)] 97.8 F (36.6 C) (11/02 0339) Pulse Rate:  [77-89] 89 (11/02 0339) Resp:  [16-20] 16 (11/02 0339) BP: (115-122)/(71-75) 115/74 (11/02 0339) SpO2:  [98 %-100 %] 100 % (11/02 0339) Last BM Date: 12/24/19  Intake/Output from previous day: No intake/output data recorded. Intake/Output this shift: No intake/output data recorded.  PE: Gen:  Alert, NAD, pleasant Abd: soft, NT to palpation in RLQ Skin: no rashes noted, warm and dry  Lab Results:  Recent Labs    12/24/19 0510 12/25/19 0355  WBC 3.7* 3.6*  HGB 10.0* 9.4*  HCT 32.0* 30.5*  PLT 216 202   BMET Recent Labs    12/24/19 0510 12/25/19 0355  NA 138 138  K 3.8 3.8  CL 101 103  CO2 29 27  GLUCOSE 97 98  BUN 5* <5*  CREATININE 0.57 0.61  CALCIUM 8.5* 8.4*   PT/INR No results for input(s): LABPROT, INR in the last 72 hours. CMP     Component Value Date/Time   NA 138 12/25/2019 0355   NA 136 02/13/2018 1118   K 3.8 12/25/2019 0355   CL 103 12/25/2019 0355   CO2 27 12/25/2019 0355   GLUCOSE 98 12/25/2019 0355   BUN <5 (L) 12/25/2019 0355   BUN 11 02/13/2018 1118   CREATININE 0.61 12/25/2019 0355   CALCIUM 8.4 (L) 12/25/2019 0355   PROT 5.6 (L) 12/22/2019 0315   PROT 6.0 02/13/2018 1118   ALBUMIN 3.2 (L) 12/22/2019 0315   ALBUMIN 4.2 02/13/2018 1118   AST 17 12/22/2019 0315   ALT 16 12/22/2019 0315   ALKPHOS 35 (L) 12/22/2019 0315   BILITOT 0.8 12/22/2019 0315   BILITOT 0.4 02/13/2018 1118   GFRNONAA >60 12/25/2019 0355   GFRAA >60 01/17/2019 0805   Lipase  No results found for: LIPASE     Studies/Results: No results found.  Anti-infectives: Anti-infectives (From admission, onward)   Start     Dose/Rate Route Frequency Ordered Stop   12/21/19 2230  sulfamethoxazole-trimethoprim (BACTRIM) 400-80 MG per tablet 1 tablet        1 tablet Oral  Every M-W-F 12/21/19 2222     12/21/19 2100  meropenem (MERREM) 1 g in sodium chloride 0.9 % 100 mL IVPB        1 g 200 mL/hr over 30 Minutes Intravenous Every 8 hours 12/21/19 2019         Assessment/Plan HTN Thyroid disease PF - on o2 and chronic prednisone  DM2 UTI - Proteus ESBL - Per TRH -   Possible Appendicitis - Patient with mildly dilated appendix on CT, w/ mild tenderness to palpation on presentation, but also found to have a UTI. Given the duration of her symptoms, I would expect more significant findings on her CT if she had appendicitis.  Pain appears to be resolving - She is currently on Merrem for her UTI. This should cover if pateint had appendicitis. I think it is reasonable to give a trial of non-operative tx, especially with patients increased risk for anesthesia with her PF.  - We will follow with you  FEN - Reg VTE - SCDs, heparin  ID - Merrem, afebrile, WBC 3.7   LOS: 3 days   Rosario Adie, MD  [facility]

## 2019-12-25 NOTE — Progress Notes (Signed)
Called pt to check on her after finding out that she is in the hospital at [facility]. She states that she will be in the hospital until Friday. [name] states that she will call to keep [name] updated on her status.

## 2019-12-25 NOTE — Progress Notes (Signed)
Patient reports to RN that she had more than 3 episodes of watery diarrhea on first full day of hospital admission, the morning of 12/21/19 into the morning of 12/22/19.  She did not have diarrhea on the second full day of admission, the morning of 12/22/19 - the morning of 12/23/19.  On the third full day, 12/24/19 into today, 12/25/19, patient states she has had more than 3 watery diarrhea episodes.    Per MD order, enteric precautions are in place.  Awaiting ID decision about sending stool for testing.

## 2019-12-25 NOTE — Progress Notes (Signed)
PROGRESS NOTE    Tasha Ballard  UEA:[id number] DOB: 1971-01-17 DOA: 12/21/2019 PCP: [name], MD     Brief Narrative:  49 y.o.WF PMHx vitamin D deficiency, thyroid disease, advanced pulmonary fibrosis/chronic respiratory failure on 10 L O2 at home, HTN, DM  type 2 without complication   who presented to the ED with a chief complaint of dysuria.   Patient is immunosuppressed since having chemo for pulmonary fibrosis.  She is on prophylactic Bactrim 3 times a week, and prednisone every day.  Her last chemo treatment was 1 year ago.  She still has her right chest port in place as there is now a suspicious nodule on her chest imaging.  Patient reports that since she had chemo a year ago she has started having UTIs.  This is her third 1.  She reports that she called her pulmonologist and was prescribed 5 or 6 days of Cipro.  10 days later she started having dysuria again.  She called her PCP who doubled her Cipro dosage to 500 mg twice daily, and ordered a urine culture.  Urine culture came back and showed multidrug resistance, but should have been sensitive to Augmentin.  Patient was prescribed 10 days of Augmentin.  She reports that that made her constipated and gave her yeast infection.  She was given Diflucan.  She also started experiencing hemorrhoids due to the constipation, and had to use suppositories.  She finished the Augmentin and 5 or 6 days later she started having dysuria again.  She had a repeat urine culture that showed an increase in the Proteus mirabilis from 50,000 to greater than 100,000 colony-forming units, and resistance to all p.o. medications.  Her PCP is a [facility] doctor and called and talked to infectious disease at [facility] who advised patient going into the ER to be admitted to the hospital for IV antibiotics.  Patient wanted to be at [facility] because her pulmonary doctor is here and if she has any pulmonary complications he is the only when she tries to take  care of her.  Patient does admit to associated fever chills.  She reports that she has had severe back pain on the right side at the level of her kidney for several weeks.  She also reports lower abdominal pain.  Both of these pains are worse right before she voids.  Patient is nauseous and reports that her last normal meal was yesterday, but she is not often eating normal meals.  She often forgets to eat, or is nauseous and throws back up whenever she is eating.  Patient is willing to be discharged when IV antibiotics at the infusion center can be set up. In the ED Temperature 97.8, heart rate 110, respiratory rate 18, blood pressure 136/98, satting at 97% on 10 L nasal cannula UA notable for positive nitrites, negative leukocyte esterase, and no bacteria seen; 0-5 WBC.  She was started on meropenem and admitted for further treatment and monitoring.  She also underwent CT abdomen/pelvis for evaluation of perinephric abscess/pyelonephritis given her lingering symptoms and outpatient failure of oral medications.  This was negative for obstruction or pyelonephritis. She was found to have a thickened appendix without periappendiceal inflammation or fluid but still some concern for possible early acute appendicitis. She was more tender in her right lower quadrant on 12/23/2019, therefore surgery was consulted for further evaluation. Due to increased risk of surgery and potential of only mild appendicitis at this time, decision was made to continue treatment  with antibiotics with repeat CT to re-eval.    Subjective: Afebrile overnight patient states watery diarrhea (12 episodes) started yesterday.  Patient has been on multiple antibiotics over the last couple of weeks.  Believes her abdominal pain has decreased but still has some pain with palpation.  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 Symptoms now have continued for approximately 3 weeks and she is s/p 3 courses of abx with increased colony count on last 2 urine cultures, suggesting untreated infection -Continue Meropenem for 7-day course (completion date 5 November) -At completion of meropenem would touch base with ID to determine if patient requires ongoing outpatient IV antibiotics. -11/2 urine culture pending -CT abdomen/pelvis negative for abscess or pyelonephritis  Abdominal pain -has a degree of chronic pains at home and she has a hard time delineating how different pain is currently compared to her usual chronic pains.  Regardless, she does appear to have an increased amount of tenderness in her right lower quadrant on 10/31. Also had some N/V on 10/30. - CT abdomen/pelvis on 12/22/2019 reveals a thickened appendix but no overt signs of acute appendicitis however there was concern for possible early appendicitis on the CT -Surgery recommendation trial of nonoperative treatment. -Repeat CT abdomen 4 November  Acute diarrhea -Patient has been on multiple antibiotics as an outpatient and inpatient now has watery diarrhea x2 days.,  Leukopenia -11/2 obtain C. difficile culture  Chronic respiratory failure with hypoxia (HCC) -Patient on 10 L nasal cannula at baseline -Titrate O2 to maintain SPO2> 88%  Idiopathic  interstitial pneumonia (HCC) -Follows closely with Dr. [name] PCCM -Continue Ofev and Esbriet -Continue chronic prednisone 10 mg daily; no indication for stress dosing at this time but will continue to monitor in setting of infection  Hypokalemia -Replete and recheck as needed  Hypothyroidism -Continue Armour Thyroid  Candidiasis of skin -Patient complaining of yeast infection under bilateral breasts, start miconazole powder BID  Leukopenia -Multifactorial, delusional, reactive secondary to infection. -Monitor closely  Anemia unspecified -Occult blood pending -Anemia panel pending       DVT prophylaxis: Subcu heparin Code Status: Full Family Communication: 11/2 husband at bedside for discussion of plan of care all questions answered Status is: Inpatient    Dispo: The patient is from: Home              Anticipated d/c is to: Home              Anticipated d/c date is: 11/6              Patient currently unstable      Consultants:  CCS   Procedures/Significant Events:  10/30 CT abdomen pelvis W contrast;    I have personally reviewed and interpreted all radiology studies and my findings are as above.  VENTILATOR SETTINGS: Nasal  cannula 11/2 Flow 6 L/min SPO2 100%   Cultures 10/29 SARS coronavirus negative  10/29 influenza A/B negative 10/29 urine insignificant growth 10/29 blood left AC NGTD 10/29 blood right AC NGTD   Antimicrobials: Anti-infectives (From admission, onward)   Start     Ordered Stop   12/21/19 2230  sulfamethoxazole-trimethoprim (BACTRIM) 400-80 MG per tablet 1 tablet        12/21/19 2222     12/21/19 2100  meropenem (MERREM) 1 g in sodium chloride 0.9 % 100 mL IVPB        12/21/19 2019         Devices    LINES / TUBES:      Continuous Infusions: . sodium chloride 50 mL/hr at 12/24/19 0529  . meropenem (MERREM) IV 1 g (12/25/19 1335)  . methocarbamol (ROBAXIN) IV 500 mg (12/23/19 2152)      Objective: Vitals:   12/24/19 1457 12/24/19 2052 12/25/19 0339 12/25/19 2058  BP: 120/71 122/75 115/74 125/63  Pulse: 84 77 89 87  Resp: _0 Temp: 97.7 F (36.5 C)  97.8 F (36.6 C) 98.2 F (36.8 C)  TempSrc: Oral  Oral Oral  SpO2: 99% 98% 100% 96%  Weight:      Height:       No intake or output data in the 24 hours ending 12/25/19 2155 Filed Weights   12/21/19 1835 12/22/19 0113  Weight: 90.3 kg 90.3 kg    Examination:  General: A/O x4, positive chronic respiratory distress Eyes: negative scleral hemorrhage, negative anisocoria, negative icterus ENT: Negative Runny nose, negative gingival bleeding, Neck:  Negative scars, masses, torticollis, lymphadenopathy, JVD Lungs: positive expiratory wheeze with crackles RIGHT>> LEFT Cardiovascular: Regular rate and rhythm without murmur gallop or rub normal S1 and S2 Abdomen: Positive acute on chronic abdominal pain RLQ, nondistended, positive soft, bowel sounds, no rebound, no ascites, no appreciable mass Extremities: No significant cyanosis, clubbing, or edema bilateral lower extremities Skin: Negative rashes, lesions, ulcers Psychiatric:  Negative depression, negative anxiety, negative fatigue, negative mania  Central nervous system:  Cranial nerves II through XII intact, tongue/uvula midline, all extremities muscle strength 5/5, sensation intact throughout, negative dysarthria, negative expressive aphasia, negative receptive aphasia.  .     Data Reviewed: Care during the described time interval was provided by me .  I have reviewed this patient's available data, including medical history, events of note, physical examination, and all test results as part of my evaluation.  CBC: Recent Labs  Lab 12/21/19 1908 12/22/19 0315 12/23/19 0339 12/24/19 0510 12/25/19 0355  WBC 9.0 5.5 3.9* 3.7* 3.6*  NEUTROABS  --  4.3 3.0 2.7 2.5  HGB 14.1 11.5* 9.9* 10.0* 9.4*  HCT 43.3 35.6* 30.9* 32.0* 30.5*  MCV 96.0 99.4  101.0* 102.6* 103.4*  PLT 294 223 204 216 976   Basic Metabolic Panel: Recent Labs  Lab 12/21/19 1908 12/22/19 0315 12/23/19 0339 12/24/19 0510 12/25/19 0355  NA 131* 134* 137 138 138  K 3.2* 3.2* 2.9* 3.8 3.8  CL 82* 88* 96* 101 103  CO2 29 32 32 29 27  GLUCOSE 159* 81 90 97 98  BUN _1 5* <5*  CREATININE 1.05* 0.87 0.62 0.57 0.61  CALCIUM 9.9 9.0 8.4* 8.5* 8.4*  MG  --  1.8 2.1 2.1 2.1   GFR: Estimated Creatinine Clearance: 86.9 mL/min (by C-G formula based on SCr of 0.61 mg/dL). Liver Function Tests: Recent Labs  Lab 12/22/19 0315  AST 17  ALT  16  ALKPHOS 35*  BILITOT 0.8  PROT 5.6*  ALBUMIN 3.2*   No results for input(s): LIPASE, AMYLASE in the last 168 hours. No results for input(s): AMMONIA in the last 168 hours. Coagulation Profile: No results for input(s): INR, PROTIME in the last 168 hours. Cardiac Enzymes: No results for input(s): CKTOTAL, CKMB, CKMBINDEX, TROPONINI in the last 168 hours. BNP (last 3 results) Recent Labs    02/08/19 1029  PROBNP 32.0   HbA1C: No results for input(s): HGBA1C in the last 72 hours. CBG: Recent Labs  Lab 12/24/19 1605 12/25/19 0755 12/25/19 1157 12/25/19 1658 12/25/19 2056  GLUCAP 191* 92 124* 145* 118*   Lipid Profile: No results for input(s): CHOL, HDL, LDLCALC, TRIG, CHOLHDL, LDLDIRECT in the last 72 hours. Thyroid Function Tests: No results for input(s): TSH, T4TOTAL, FREET4, T3FREE, THYROIDAB in the last 72 hours. Anemia Panel: No results for input(s): VITAMINB12, FOLATE, FERRITIN, TIBC, IRON, RETICCTPCT in the last 72 hours. Sepsis Labs: No results for input(s): PROCALCITON, LATICACIDVEN in the last 168 hours.  Recent Results (from the past 240 hour(s))  Urine culture     Status: Abnormal   Collection Time: 12/21/19  6:44 PM   Specimen: Urine, Clean Catch  Result Value Ref Range Status   Specimen Description   Final    URINE, CLEAN CATCH Performed at [facility], [place]  [address] [place], [place] [address]    Special Requests   Final    NONE Performed at [facility], [place] [address], [place], [place] [address]    Culture (A)  Final    <10,000 COLONIES/mL INSIGNIFICANT GROWTH Performed at [facility] [address] [place], [place] [address]    Report Status 12/23/2019 FINAL  Final  Blood culture (routine x 2)     Status: None (Preliminary result)   Collection Time: 12/21/19  9:40 PM   Specimen: BLOOD  Result Value Ref Range Status   Specimen Description   Final    BLOOD LEFT ANTECUBITAL Performed at [facility] [address], [place], [place] [address]    Special Requests   Final    BOTTLES DRAWN AEROBIC AND ANAEROBIC Blood Culture adequate volume Performed at [facility] [address], [place],  [address]    Culture   Final    NO GROWTH 3 DAYS Performed at [facility], [place] [address] [place],  [address]    Report Status PENDING  Incomplete  Respiratory Panel by RT PCR (Flu A&B, Covid) - Nasopharyngeal Swab     Status: None   Collection Time: 12/21/19  9:50 PM   Specimen: Nasopharyngeal Swab  Result Value Ref Range Status   SARS Coronavirus 2 by RT PCR NEGATIVE NEGATIVE Final    Comment: (NOTE) SARS-CoV-2 target nucleic acids are NOT DETECTED.  The SARS-CoV-2 RNA is generally detectable in upper respiratoy specimens during the acute phase of infection. The lowest concentration of SARS-CoV-2 viral copies this assay can detect is 131 copies/mL. A negative result does not preclude SARS-Cov-2 infection and should not be used as the sole basis for treatment or other patient management decisions. A negative result may occur with  improper specimen collection/handling, submission of specimen other than nasopharyngeal swab, presence of viral mutation(s) within the areas targeted by this assay, and inadequate number of viral copies (<131 copies/mL). A  negative result must be combined with clinical observations, patient history, and epidemiological information. The expected result is  Negative.  Fact Sheet for Patients:  PinkCheek.be  Fact Sheet for Healthcare Providers:  GravelBags.it  This test is no t yet approved or cleared by the [place] FDA and  has been authorized for detection and/or diagnosis of SARS-CoV-2 by FDA under an Emergency Use Authorization (EUA). This EUA will remain  in effect (meaning this test can be used) for the duration of the COVID-19 declaration under Section 564(b)(1) of the Act, 21 U.S.C. section 360bbb-3(b)(1), unless the authorization is terminated or revoked sooner.     Influenza A by PCR NEGATIVE NEGATIVE Final   Influenza B by PCR NEGATIVE NEGATIVE Final    Comment: (NOTE) The Xpert Xpress SARS-CoV-2/FLU/RSV assay is intended as an aid in  the diagnosis of influenza from Nasopharyngeal swab specimens and  should not be used as a sole basis for treatment. Nasal washings and  aspirates are unacceptable for Xpert Xpress SARS-CoV-2/FLU/RSV  testing.  Fact Sheet for Patients: PinkCheek.be  Fact Sheet for Healthcare Providers: GravelBags.it  This test is not yet approved or cleared by the [place] FDA and  has been authorized for detection and/or diagnosis of SARS-CoV-2 by  FDA under an Emergency Use Authorization (EUA). This EUA will remain  in effect (meaning this test can be used) for the duration of the  Covid-19 declaration under Section 564(b)(1) of the Act, 21  U.S.C. section 360bbb-3(b)(1), unless the authorization is  terminated or revoked. Performed at [facility], [place] [address], [place], [place] [address]   Blood culture (routine x 2)     Status: None (Preliminary result)   Collection Time: 12/21/19  9:50 PM   Specimen: BLOOD  Result  Value Ref Range Status   Specimen Description   Final    BLOOD RIGHT ANTECUBITAL Performed at [facility] [address], [place], [place] [address]    Special Requests   Final    BOTTLES DRAWN AEROBIC AND ANAEROBIC Blood Culture adequate volume Performed at [facility] [address] [place], [place] [address]    Culture   Final    NO GROWTH 3 DAYS Performed at [facility], [place] [address] [place], [place] [address]    Report Status PENDING  Incomplete         Radiology Studies: No results found.      Scheduled Meds: . carvedilol  6.25 mg Oral BID WC  . Chlorhexidine Gluconate Cloth  6 each Topical Daily  . famotidine  40 mg Oral Daily  . gabapentin  100 mg Oral BID  . heparin  5,000 Units Subcutaneous Q8H  . metroNIDAZOLE  1 Applicatorful Vaginal QHS  . miconazole nitrate   Topical BID  . montelukast  10 mg Oral QPC supper  . Nintedanib  150 mg Oral BID  . Pirfenidone  1 tablet Oral TID WC  . predniSONE  10 mg Oral Q breakfast  . saccharomyces boulardii  250 mg Oral BID  . sodium chloride flush  10-40 mL Intracatheter Q12H  . spironolactone  100 mg Oral Daily  . sulfamethoxazole-trimethoprim  1 tablet Oral Q M,W,F  . thyroid  90 mg Oral QAC breakfast   Continuous Infusions: . sodium chloride 50 mL/hr at 12/24/19 0529  . meropenem (MERREM) IV 1 g (12/25/19 1335)  . methocarbamol (ROBAXIN) IV 500 mg (12/23/19 2152)     LOS: 3 days    Time spent:40 min    Chistina Roston, Geraldo Docker, MD [facility] Pager [phone]  If  7PM-7AM, please contact night-coverage [email or web address] Password [facility] 12/25/2019, 9:55 PM

## 2019-12-26 LAB — IRON AND TIBC
Iron: 78 ug/dL (ref 28–170)
Saturation Ratios: 37 % — ABNORMAL HIGH (ref 10.4–31.8)
TIBC: 210 ug/dL — ABNORMAL LOW (ref 250–450)
UIBC: 132 ug/dL

## 2019-12-26 LAB — RETICULOCYTES
Immature Retic Fract: 27.7 % — ABNORMAL HIGH (ref 2.3–15.9)
RBC.: 2.89 MIL/uL — ABNORMAL LOW (ref 3.87–5.11)
Retic Count, Absolute: 87.6 10*3/uL (ref 19.0–186.0)
Retic Ct Pct: 3 % (ref 0.4–3.1)

## 2019-12-26 LAB — BASIC METABOLIC PANEL
Anion gap: 3 — ABNORMAL LOW (ref 5–15)
BUN: <5 mg/dL — ABNORMAL LOW (ref 6–20)
CO2: 29 mmol/L (ref 22–32)
Calcium: 8.2 mg/dL — ABNORMAL LOW (ref 8.9–10.3)
Chloride: 108 mmol/L (ref 98–111)
Creatinine, Ser: 0.49 mg/dL (ref 0.44–1.00)
GFR, Estimated: >60 mL/min (ref >60)
Glucose, Bld: 93 mg/dL (ref 70–99)
Potassium: 3.9 mmol/L (ref 3.5–5.1)
Sodium: 140 mmol/L (ref 135–145)

## 2019-12-26 LAB — GLUCOSE, CAPILLARY
Glucose-Capillary: 78 mg/dL (ref 70–99)
Glucose-Capillary: 114 mg/dL — ABNORMAL HIGH (ref 70–99)
Glucose-Capillary: 147 mg/dL — ABNORMAL HIGH (ref 70–99)

## 2019-12-26 LAB — CBC WITH DIFFERENTIAL/PLATELET
Abs Immature Granulocytes: 0.16 10*3/uL — ABNORMAL HIGH (ref 0.00–0.07)
Basophils Absolute: 0 10*3/uL (ref 0.0–0.1)
Basophils Relative: 1 %
Eosinophils Absolute: 0.1 10*3/uL (ref 0.0–0.5)
Eosinophils Relative: 3 %
HCT: 30.2 % — ABNORMAL LOW (ref 36.0–46.0)
Hemoglobin: 9.2 g/dL — ABNORMAL LOW (ref 12.0–15.0)
Immature Granulocytes: 5 %
Lymphocytes Relative: 7 %
Lymphs Abs: 0.3 10*3/uL — ABNORMAL LOW (ref 0.7–4.0)
MCH: 31.6 pg (ref 26.0–34.0)
MCHC: 30.5 g/dL (ref 30.0–36.0)
MCV: 103.8 fL — ABNORMAL HIGH (ref 80.0–100.0)
Monocytes Absolute: 0.5 10*3/uL (ref 0.1–1.0)
Monocytes Relative: 13 %
Neutro Abs: 2.5 10*3/uL (ref 1.7–7.7)
Neutrophils Relative %: 71 %
Platelets: 204 10*3/uL (ref 150–400)
RBC: 2.91 MIL/uL — ABNORMAL LOW (ref 3.87–5.11)
RDW: 15.5 % (ref 11.5–15.5)
WBC: 3.5 10*3/uL — ABNORMAL LOW (ref 4.0–10.5)
nRBC: 0 % (ref 0.0–0.2)

## 2019-12-26 LAB — FERRITIN: Ferritin: 98 ng/mL (ref 11–307)

## 2019-12-26 LAB — PHOSPHORUS: Phosphorus: 2 mg/dL — ABNORMAL LOW (ref 2.5–4.6)

## 2019-12-26 LAB — URINE CULTURE: Culture: NO GROWTH

## 2019-12-26 LAB — MAGNESIUM: Magnesium: 2 mg/dL (ref 1.7–2.4)

## 2019-12-26 LAB — FOLATE: Folate: 1.2 ng/mL — ABNORMAL LOW (ref >5.9)

## 2019-12-26 LAB — VITAMIN B12: Vitamin B-12: 1253 pg/mL — ABNORMAL HIGH (ref 180–914)

## 2019-12-26 MED ORDER — IOHEXOL 9 MG/ML PO SOLN: 500.0000 mL | ORAL | Status: AC

## 2019-12-26 MED ORDER — FUROSEMIDE 40 MG PO TABS
40.0000 mg | ORAL_TABLET | Freq: Every day | ORAL | Status: DC
  Administered 2019-12-26: 40 mg via ORAL
  Filled 2019-12-26: qty 1

## 2019-12-26 MED ORDER — POTASSIUM CHLORIDE CRYS ER 20 MEQ PO TBCR
20.0000 meq | EXTENDED_RELEASE_TABLET | Freq: Every day | ORAL | Status: DC
  Administered 2019-12-27: 20 meq via ORAL
  Filled 2019-12-26: qty 1

## 2019-12-26 MED ORDER — IOHEXOL 9 MG/ML PO SOLN
ORAL | Status: AC
  Filled 2019-12-26: qty 1000

## 2019-12-26 MED ORDER — IOHEXOL 9 MG/ML PO SOLN: 500.0000 mL | ORAL | Status: DC

## 2019-12-26 MED ORDER — FOLIC ACID 1 MG PO TABS
1.0000 mg | ORAL_TABLET | Freq: Every day | ORAL | Status: DC
  Administered 2019-12-26 – 2019-12-28 (×3): 1 mg via ORAL
  Filled 2019-12-26 (×4): qty 1

## 2019-12-26 MED ORDER — SODIUM CHLORIDE 0.9 % IV SOLN
1.0000 g | Freq: Every day | INTRAVENOUS | Status: AC
  Administered 2019-12-26 – 2019-12-28 (×3): 1000 mg via INTRAVENOUS
  Filled 2019-12-26 (×3): qty 1

## 2019-12-26 NOTE — Progress Notes (Signed)
Subjective: CC: Pain better, having less diarrhea  Objective: Vital signs in last 24 hours: Temp:  [97.5 F (36.4 C)-98.2 F (36.8 C)] 97.5 F (36.4 C) (11/03 0557) Pulse Rate:  [87-92] 92 (11/03 0557) Resp:  [17-18] 17 (11/03 0557) BP: (125-138)/(63-85) 138/85 (11/03 0557) SpO2:  [96 %-99 %] 99 % (11/03 0557) Weight:  [97.5 kg] 97.5 kg (11/03 0557) Last BM Date: 12/26/19  Intake/Output from previous day: 11/02 0701 - 11/03 0700 In: 360 [P.O.:360] Out: 600 [Urine:600] Intake/Output this shift: Total I/O In: -  Out: 400 [Urine:400]  PE: Gen:  Alert, NAD, pleasant Abd: soft, NT to palpation in RLQ Skin: no rashes noted, warm and dry  Lab Results:  Recent Labs    12/25/19 0355 12/26/19 0350  WBC 3.6* 3.5*  HGB 9.4* 9.2*  HCT 30.5* 30.2*  PLT 202 204   BMET Recent Labs    12/25/19 0355 12/26/19 0350  NA 138 140  K 3.8 3.9  CL 103 108  CO2 27 29  GLUCOSE 98 93  BUN <5* <5*  CREATININE 0.61 0.49  CALCIUM 8.4* 8.2*   PT/INR No results for input(s): LABPROT, INR in the last 72 hours. CMP     Component Value Date/Time   NA 140 12/26/2019 0350   NA 136 02/13/2018 1118   K 3.9 12/26/2019 0350   CL 108 12/26/2019 0350   CO2 29 12/26/2019 0350   GLUCOSE 93 12/26/2019 0350   BUN <5 (L) 12/26/2019 0350   BUN 11 02/13/2018 1118   CREATININE 0.49 12/26/2019 0350   CALCIUM 8.2 (L) 12/26/2019 0350   PROT 5.6 (L) 12/22/2019 0315   PROT 6.0 02/13/2018 1118   ALBUMIN 3.2 (L) 12/22/2019 0315   ALBUMIN 4.2 02/13/2018 1118   AST 17 12/22/2019 0315   ALT 16 12/22/2019 0315   ALKPHOS 35 (L) 12/22/2019 0315   BILITOT 0.8 12/22/2019 0315   BILITOT 0.4 02/13/2018 1118   GFRNONAA >60 12/26/2019 0350   GFRAA >60 01/17/2019 0805   Lipase  No results found for: LIPASE     Studies/Results: No results found.  Anti-infectives: Anti-infectives (From admission, onward)   Start     Dose/Rate Route Frequency Ordered Stop   12/26/19 1400  ertapenem  (INVANZ) 1,000 mg in sodium chloride 0.9 % 100 mL IVPB        1 g 200 mL/hr over 30 Minutes Intravenous Daily 12/26/19 0916 12/29/19 0959   12/21/19 2230  sulfamethoxazole-trimethoprim (BACTRIM) 400-80 MG per tablet 1 tablet        1 tablet Oral Every M-W-F 12/21/19 2222     12/21/19 2100  meropenem (MERREM) 1 g in sodium chloride 0.9 % 100 mL IVPB  Status:  Discontinued        1 g 200 mL/hr over 30 Minutes Intravenous Every 8 hours 12/21/19 2019 12/26/19 0916       Assessment/Plan HTN Thyroid disease PF - on o2 and chronic prednisone  DM2 UTI - Proteus ESBL - Per TRH -   Possible Appendicitis - Patient with mildly dilated appendix on CT, w/ mild tenderness to palpation on presentation, but also found to have a UTI. Given the duration of her symptoms, I would expect more significant findings on her CT if she had appendicitis.  Pain appears to be resolving - She is currently on Merrem for her UTI. This should cover if pateint had appendicitis. I think it is reasonable to give a trial of non-operative tx, especially  with patients increased risk for anesthesia with her PF.  - Clinically she has improved.  Will sign off.  No need for repeat CT scan with improving symptoms  FEN - Reg VTE - SCDs, heparin  ID - Merrem, afebrile, WBC 3.7   LOS: 4 days   Rosario Adie, MD  [facility]

## 2019-12-26 NOTE — Progress Notes (Signed)
Patient ID: Tasha Ballard, female   DOB: 11-May-1970, 49 y.o.   MRN: [id number]  PROGRESS NOTE    Tasha Ballard  QIW:[id number] DOB: 18-Aug-1970 DOA: 12/21/2019 PCP: [name], MD   Brief Narrative:  49 year old female with history of vitamin D deficiency, thyroid disease, advanced pulmonary fibrosis/chronic respiratory failure on 10 L oxygen at home, hypertension, diabetes mellitus type 2, recurrent UTIs presented with dysuria.  Patient was being treated with oral ciprofloxacin as an outpatient.  Subsequently urine culture showed multidrug resistance but sensitive to Augmentin for which she was prescribed Augmentin.  After finishing Augmentin, she started having dysuria again 5 6 days later.  She had a repeat urine culture which showed multidrug-resistant Proteus mirabilis for which patient was advised for hospitalization for IV antibiotics.  On presentation, vitals were stable; UA was positive for nitrites.  She was started on meropenem.  CT of abdomen/pelvis was negative for obstruction or pyelonephritis but showed thickened appendix without periappendiceal inflammation or fluid but still some concern for possible early acute appendicitis.  General surgery was consulted and recommended conservative management.  Assessment & Plan:   ESBL Proteus mirabilis complicated UTI History of recurrent UTIs -Outside urine culture was positive for ESBL Proteus mirabilis -Currently on meropenem: Switch to once a day ertapenem.  Finish 7-day course of therapy till 12/28/2019 -CT abdomen and pelvis was negative for abscess or pyelonephritis.  Consider outpatient evaluation and follow-up by urology  Possible acute appendicitis -General surgery following and have signed off.  General surgery recommended conservative management.  No need for further CAT scan as per general surgery  Diarrhea -Improving.  If no further diarrhea, will DC C. difficile testing  Chronic hypoxic respiratory  failure Advanced pulmonary fibrosis -Patient is on 10 L oxygen via nasal cannula.  Continue oxygen supplementation.  Continue Ofev and pirfenidone.  Outpatient follow-up with pulmonary/Dr. [name].  Recommend outpatient goals of care discussion with palliative care  Hypokalemia -Resolved  Hypothyroidism -Continue supplementation  Hypertension--continue Coreg  Candidiasis of the skin -Continue topical antifungals.  Anemia of chronic disease -Hemoglobin stable.  No signs of bleeding.  Macrocytosis -Check E17 and folic acid  Leukopenia -Probably from infection.  Stable  Generalized conditioning -Patient tolerated PT  DVT prophylaxis: Heparin Code Status: Full Family Communication: Husband at bedside Disposition Plan: Status is: Inpatient  Remains inpatient appropriate because: Of severity of illness.  Possible discharge on 12/28/2019   Dispo: The patient is from: Home              Anticipated d/c is to: Home              Anticipated d/c date is: 2 days              Patient currently is not medically stable to d/c.   Consultants: None  Procedures: None  Antimicrobials:  Anti-infectives (From admission, onward)   Start     Dose/Rate Route Frequency Ordered Stop   12/26/19 1400  ertapenem (INVANZ) 1,000 mg in sodium chloride 0.9 % 100 mL IVPB        1 g 200 mL/hr over 30 Minutes Intravenous Daily 12/26/19 0916 12/29/19 0959   12/21/19 2230  sulfamethoxazole-trimethoprim (BACTRIM) 400-80 MG per tablet 1 tablet        1 tablet Oral Every M-W-F 12/21/19 2222     12/21/19 2100  meropenem (MERREM) 1 g in sodium chloride 0.9 % 100 mL IVPB  Status:  Discontinued        1 g  200 mL/hr over 30 Minutes Intravenous Every 8 hours 12/21/19 2019 12/26/19 0916       Subjective: Patient seen and examined at bedside.  Feels slightly better today.  Diarrhea is improving.  Abdominal pain is also improving.  Feels more short of breath.  Objective: Vitals:   12/25/19 0339  12/25/19 2058 12/26/19 0500 12/26/19 0557  BP: 115/74 125/63  138/85  Pulse: 89 87  92  Resp: _0 Temp: 97.8 F (36.6 C) 98.2 F (36.8 C)  (!) 97.5 F (36.4 C)  TempSrc: Oral Oral  Oral  SpO2: 100% 96%  99%  Weight:   97.5 kg 97.5 kg  Height:        Intake/Output Summary (Last 24 hours) at 12/26/2019 1329 Last data filed at 12/26/2019 0926 Gross per 24 hour  Intake 360 ml  Output 1000 ml  Net -640 ml   Filed Weights   12/22/19 0113 12/26/19 0500 12/26/19 0557  Weight: 90.3 kg 97.5 kg 97.5 kg    Examination:  General exam: Appears calm and comfortable.  Looks chronically ill. Respiratory system: Bilateral decreased breath sounds at bases with scattered crackles Cardiovascular system: S1 & S2 heard, Rate controlled Gastrointestinal system: Abdomen is nondistended, soft and mildly tender in the lower quadrant. Normal bowel sounds heard. Extremities: No cyanosis, clubbing; trace lower extremity edema present Central nervous system: Alert and oriented. No focal neurological deficits. Moving extremities Skin: No rashes, lesions or ulcers Psychiatry: Flat affect.     Data Reviewed: I have personally reviewed following labs and imaging studies  CBC: Recent Labs  Lab 12/22/19 0315 12/23/19 0339 12/24/19 0510 12/25/19 0355 12/26/19 0350  WBC 5.5 3.9* 3.7* 3.6* 3.5*  NEUTROABS 4.3 3.0 2.7 2.5 2.5  HGB 11.5* 9.9* 10.0* 9.4* 9.2*  HCT 35.6* 30.9* 32.0* 30.5* 30.2*  MCV 99.4 101.0* 102.6* 103.4* 103.8*  PLT 223 204 216 202 450   Basic Metabolic Panel: Recent Labs  Lab 12/22/19 0315 12/23/19 0339 12/24/19 0510 12/25/19 0355 12/26/19 0350  NA 134* 137 138 138 140  K 3.2* 2.9* 3.8 3.8 3.9  CL 88* 96* 101 103 108  CO2 32 32 _1 GLUCOSE 81 90 97 98 93  BUN 11 6 5* <5* <5*  CREATININE 0.87 0.62 0.57 0.61 0.49  CALCIUM 9.0 8.4* 8.5* 8.4* 8.2*  MG 1.8 2.1 2.1 2.1 2.0  PHOS  --   --   --   --  2.0*   GFR: Estimated Creatinine Clearance: 90.8 mL/min (by  C-G formula based on SCr of 0.49 mg/dL). Liver Function Tests: Recent Labs  Lab 12/22/19 0315  AST 17  ALT 16  ALKPHOS 35*  BILITOT 0.8  PROT 5.6*  ALBUMIN 3.2*   No results for input(s): LIPASE, AMYLASE in the last 168 hours. No results for input(s): AMMONIA in the last 168 hours. Coagulation Profile: No results for input(s): INR, PROTIME in the last 168 hours. Cardiac Enzymes: No results for input(s): CKTOTAL, CKMB, CKMBINDEX, TROPONINI in the last 168 hours. BNP (last 3 results) Recent Labs    02/08/19 1029  PROBNP 32.0   HbA1C: No results for input(s): HGBA1C in the last 72 hours. CBG: Recent Labs  Lab 12/25/19 1157 12/25/19 1658 12/25/19 2056 12/26/19 0733 12/26/19 1125  GLUCAP 124* 145* 118* 78 114*   Lipid Profile: No results for input(s): CHOL, HDL, LDLCALC, TRIG, CHOLHDL, LDLDIRECT in the last 72 hours. Thyroid Function Tests: No results for input(s): TSH, T4TOTAL, FREET4, T3FREE,  THYROIDAB in the last 72 hours. Anemia Panel: Recent Labs    12/26/19 0350  VITAMINB12 1,253*  FOLATE 1.2*  FERRITIN 98  TIBC 210*  IRON 78  RETICCTPCT 3.0   Sepsis Labs: No results for input(s): PROCALCITON, LATICACIDVEN in the last 168 hours.  Recent Results (from the past 240 hour(s))  Urine culture     Status: Abnormal   Collection Time: 12/21/19  6:44 PM   Specimen: Urine, Clean Catch  Result Value Ref Range Status   Specimen Description   Final    URINE, CLEAN CATCH Performed at [facility], [place] [address], [place], [place] [address]    Special Requests   Final    NONE Performed at [facility], [place] [address] [place], [place] [address]    Culture (A)  Final    <10,000 COLONIES/mL INSIGNIFICANT GROWTH Performed at [facility] [address] [place], [place] [address]    Report Status 12/23/2019 FINAL  Final  Blood culture (routine x 2)     Status: None (Preliminary result)   Collection Time: 12/21/19   9:40 PM   Specimen: BLOOD  Result Value Ref Range Status   Specimen Description   Final    BLOOD LEFT ANTECUBITAL Performed at [facility] [address] [place], [place] [address]    Special Requests   Final    BOTTLES DRAWN AEROBIC AND ANAEROBIC Blood Culture adequate volume Performed at [facility] [address] [place], [place] [address]    Culture   Final    NO GROWTH 4 DAYS Performed at [facility], [place] [address] [place], [place] [address]    Report Status PENDING  Incomplete  Respiratory Panel by RT PCR (Flu A&B, Covid) - Nasopharyngeal Swab     Status: None   Collection Time: 12/21/19  9:50 PM   Specimen: Nasopharyngeal Swab  Result Value Ref Range Status   SARS Coronavirus 2 by RT PCR NEGATIVE NEGATIVE Final    Comment: (NOTE) SARS-CoV-2 target nucleic acids are NOT DETECTED.  The SARS-CoV-2 RNA is generally detectable in upper respiratoy specimens during the acute phase of infection. The lowest concentration of SARS-CoV-2 viral copies this assay can detect is 131 copies/mL. A negative result does not preclude SARS-Cov-2 infection and should not be used as the sole basis for treatment or other patient management decisions. A negative result may occur with  improper specimen collection/handling, submission of specimen other than nasopharyngeal swab, presence of viral mutation(s) within the areas targeted by this assay, and inadequate number of viral copies (<131 copies/mL). A negative result must be combined with clinical observations, patient history, and epidemiological information. The expected result is Negative.  Fact Sheet for Patients:  PinkCheek.be  Fact Sheet for Healthcare Providers:  GravelBags.it  This test is no t yet approved or cleared by the [place] FDA and  has been authorized for detection and/or diagnosis of SARS-CoV-2 by FDA  under an Emergency Use Authorization (EUA). This EUA will remain  in effect (meaning this test can be used) for the duration of the COVID-19 declaration under Section 564(b)(1) of the Act, 21 U.S.C. section 360bbb-3(b)(1), unless the authorization is terminated or revoked sooner.     Influenza A by PCR NEGATIVE NEGATIVE Final   Influenza B by PCR NEGATIVE NEGATIVE Final    Comment: (NOTE) The Xpert Xpress SARS-CoV-2/FLU/RSV assay is intended as an aid in  the diagnosis of influenza from Nasopharyngeal  swab specimens and  should not be used as a sole basis for treatment. Nasal washings and  aspirates are unacceptable for Xpert Xpress SARS-CoV-2/FLU/RSV  testing.  Fact Sheet for Patients: PinkCheek.be  Fact Sheet for Healthcare Providers: GravelBags.it  This test is not yet approved or cleared by the [place] FDA and  has been authorized for detection and/or diagnosis of SARS-CoV-2 by  FDA under an Emergency Use Authorization (EUA). This EUA will remain  in effect (meaning this test can be used) for the duration of the  Covid-19 declaration under Section 564(b)(1) of the Act, 21  U.S.C. section 360bbb-3(b)(1), unless the authorization is  terminated or revoked. Performed at [facility], [place] [address], [place], [place] [address]   Blood culture (routine x 2)     Status: None (Preliminary result)   Collection Time: 12/21/19  9:50 PM   Specimen: BLOOD  Result Value Ref Range Status   Specimen Description   Final    BLOOD RIGHT ANTECUBITAL Performed at [facility] [address] [place], [place] [address]    Special Requests   Final    BOTTLES DRAWN AEROBIC AND ANAEROBIC Blood Culture adequate volume Performed at [facility] [address] [place], [place] [address]    Culture   Final    NO GROWTH 4 DAYS Performed at [facility], [place]  [address], [place], [place] [address]    Report Status PENDING  Incomplete         Radiology Studies: No results found.      Scheduled Meds: . carvedilol  6.25 mg Oral BID WC  . Chlorhexidine Gluconate Cloth  6 each Topical Daily  . famotidine  40 mg Oral Daily  . folic acid  1 mg Oral Daily  . gabapentin  100 mg Oral BID  . heparin  5,000 Units Subcutaneous Q8H  . [START ON 12/27/2019] iohexol  500 mL Oral Q1H  . iohexol      . metroNIDAZOLE  1 Applicatorful Vaginal QHS  . miconazole nitrate   Topical BID  . montelukast  10 mg Oral QPC supper  . Nintedanib  150 mg Oral BID  . Pirfenidone  1 tablet Oral TID WC  . predniSONE  10 mg Oral Q breakfast  . saccharomyces boulardii  250 mg Oral BID  . spironolactone  100 mg Oral Daily  . sulfamethoxazole-trimethoprim  1 tablet Oral Q M,W,F  . thyroid  90 mg Oral QAC breakfast   Continuous Infusions: . ertapenem    . methocarbamol (ROBAXIN) IV 500 mg (12/25/19 2243)          Aline August, MD [facility] 12/26/2019, 1:29 PM

## 2019-12-27 ENCOUNTER — Encounter (HOSPITAL_COMMUNITY): Payer: Self-pay

## 2019-12-27 LAB — CBC WITH DIFFERENTIAL/PLATELET
Abs Immature Granulocytes: 0.12 10*3/uL — ABNORMAL HIGH (ref 0.00–0.07)
Basophils Absolute: 0 10*3/uL (ref 0.0–0.1)
Basophils Relative: 1 %
Eosinophils Absolute: 0.1 10*3/uL (ref 0.0–0.5)
Eosinophils Relative: 3 %
HCT: 31.5 % — ABNORMAL LOW (ref 36.0–46.0)
Hemoglobin: 9.7 g/dL — ABNORMAL LOW (ref 12.0–15.0)
Immature Granulocytes: 3 %
Lymphocytes Relative: 8 %
Lymphs Abs: 0.3 10*3/uL — ABNORMAL LOW (ref 0.7–4.0)
MCH: 31.5 pg (ref 26.0–34.0)
MCHC: 30.8 g/dL (ref 30.0–36.0)
MCV: 102.3 fL — ABNORMAL HIGH (ref 80.0–100.0)
Monocytes Absolute: 0.5 10*3/uL (ref 0.1–1.0)
Monocytes Relative: 12 %
Neutro Abs: 2.9 10*3/uL (ref 1.7–7.7)
Neutrophils Relative %: 73 %
Platelets: 214 10*3/uL (ref 150–400)
RBC: 3.08 MIL/uL — ABNORMAL LOW (ref 3.87–5.11)
RDW: 15.6 % — ABNORMAL HIGH (ref 11.5–15.5)
WBC: 4 10*3/uL (ref 4.0–10.5)
nRBC: 0 % (ref 0.0–0.2)

## 2019-12-27 LAB — CULTURE, BLOOD (ROUTINE X 2)
Culture: NO GROWTH
Culture: NO GROWTH
Special Requests: ADEQUATE
Special Requests: ADEQUATE

## 2019-12-27 LAB — BASIC METABOLIC PANEL
Anion gap: 9 (ref 5–15)
BUN: <5 mg/dL — ABNORMAL LOW (ref 6–20)
CO2: 29 mmol/L (ref 22–32)
Calcium: 8.1 mg/dL — ABNORMAL LOW (ref 8.9–10.3)
Chloride: 102 mmol/L (ref 98–111)
Creatinine, Ser: 0.53 mg/dL (ref 0.44–1.00)
GFR, Estimated: >60 mL/min (ref >60)
Glucose, Bld: 87 mg/dL (ref 70–99)
Potassium: 3.6 mmol/L (ref 3.5–5.1)
Sodium: 140 mmol/L (ref 135–145)

## 2019-12-27 LAB — MAGNESIUM: Magnesium: 1.9 mg/dL (ref 1.7–2.4)

## 2019-12-27 LAB — GLUCOSE, CAPILLARY
Glucose-Capillary: 86 mg/dL (ref 70–99)
Glucose-Capillary: 160 mg/dL — ABNORMAL HIGH (ref 70–99)
Glucose-Capillary: 140 mg/dL — ABNORMAL HIGH (ref 70–99)

## 2019-12-27 MED ORDER — FUROSEMIDE 40 MG PO TABS
40.0000 mg | ORAL_TABLET | Freq: Two times a day (BID) | ORAL | Status: AC
  Administered 2019-12-27 (×2): 40 mg via ORAL
  Filled 2019-12-27 (×2): qty 1

## 2019-12-27 NOTE — Progress Notes (Signed)
Patient ID: Tasha Ballard, female   DOB: 03-Apr-1970, 49 y.o.   MRN: [id number]  PROGRESS NOTE    Tasha Ballard  HQI:[id number] DOB: 08/20/1970 DOA: 12/21/2019 PCP: [name], MD   Brief Narrative:  49 year old female with history of vitamin D deficiency, thyroid disease, advanced pulmonary fibrosis/chronic respiratory failure on 10 L oxygen at home, hypertension, diabetes mellitus type 2, recurrent UTIs presented with dysuria.  Patient was being treated with oral ciprofloxacin as an outpatient.  Subsequently urine culture showed multidrug resistance but sensitive to Augmentin for which she was prescribed Augmentin.  After finishing Augmentin, she started having dysuria again 5 6 days later.  She had a repeat urine culture which showed multidrug-resistant Proteus mirabilis for which patient was advised for hospitalization for IV antibiotics.  On presentation, vitals were stable; UA was positive for nitrites.  She was started on meropenem.  CT of abdomen/pelvis was negative for obstruction or pyelonephritis but showed thickened appendix without periappendiceal inflammation or fluid but still some concern for possible early acute appendicitis.  General surgery was consulted and recommended conservative management.  Assessment & Plan:   ESBL Proteus mirabilis complicated UTI History of recurrent UTIs -Outside urine culture was positive for ESBL Proteus mirabilis -Currently on meropenem: Switch to once a day ertapenem.  Finish 7-day course of therapy till 12/28/2019 -CT abdomen and pelvis was negative for abscess or pyelonephritis.  Consider outpatient evaluation and follow-up by urology  Possible acute appendicitis -General surgery following and have signed off.  General surgery recommended conservative management.  No need for further CAT scan as per general surgery  Diarrhea -Improving.  If no further diarrhea, will DC C. difficile testing  Chronic hypoxic respiratory  failure Advanced pulmonary fibrosis -Patient is on 10 L oxygen via nasal cannula.  Continue oxygen supplementation.  Continue Ofev and pirfenidone.  Outpatient follow-up with pulmonary/Dr. [name].  Recommend outpatient goals of care discussion with palliative care -Oral Lasix was resumed on 12/26/2019, patient takes 40 mg daily and additional dose as needed.  Will give one additional dose of Lasix today as patient feels that she might need an extra dose.  Hypokalemia -Resolved  Hypothyroidism -Continue supplementation  Hypertension--continue Coreg  Candidiasis of the skin -Continue topical antifungals.  Anemia of chronic disease -Hemoglobin stable.  No signs of bleeding.  Folic acid deficiency Macrocytosis -Folic acid level is 1.2.  Vitamin B12 1253.  Continue folic acid supplementation  Leukopenia -Probably from infection.  Resolved.  Generalized conditioning -Patient tolerated PT  DVT prophylaxis: Heparin Code Status: Full Family Communication: Husband at bedside Disposition Plan: Status is: Inpatient  Remains inpatient appropriate because: Of severity of illness.  Possible discharge on 12/28/2019 if remains hemodynamically stable.   Dispo: The patient is from: Home              Anticipated d/c is to: Home              Anticipated d/c date is: 1 day              Patient currently is not medically stable to d/c.   Consultants: None  Procedures: None  Antimicrobials:  Anti-infectives (From admission, onward)   Start     Dose/Rate Route Frequency Ordered Stop   12/26/19 1400  ertapenem (INVANZ) 1,000 mg in sodium chloride 0.9 % 100 mL IVPB        1 g 200 mL/hr over 30 Minutes Intravenous Daily 12/26/19 0916 12/29/19 0959   12/21/19 2230  sulfamethoxazole-trimethoprim (BACTRIM) 400-80  MG per tablet 1 tablet        1 tablet Oral Every M-W-F 12/21/19 2222     12/21/19 2100  meropenem (MERREM) 1 g in sodium chloride 0.9 % 100 mL IVPB  Status:  Discontinued         1 g 200 mL/hr over 30 Minutes Intravenous Every 8 hours 12/21/19 2019 12/26/19 0916       Subjective: Patient seen and examined at bedside.  Denies worsening shortness of breath, nausea, vomiting or fevers.  Diarrhea is improving. Objective: Vitals:   12/26/19 1658 12/26/19 2132 12/27/19 0443 12/27/19 0500  BP: 133/83 (!) 139/91 131/89   Pulse:  89 92   Resp:  18 18   Temp:  98.1 F (36.7 C) 98.6 F (37 C)   TempSrc:  Oral Oral   SpO2:  97% 97%   Weight:    95.1 kg  Height:        Intake/Output Summary (Last 24 hours) at 12/27/2019 0758 Last data filed at 12/27/2019 0600 Gross per 24 hour  Intake 559.89 ml  Output 3750 ml  Net -3190.11 ml   Filed Weights   12/26/19 0500 12/26/19 0557 12/27/19 0500  Weight: 97.5 kg 97.5 kg 95.1 kg    Examination:  General exam: Chronically ill looking.  No acute distress.  Currently on 7 L oxygen via nasal cannula  respiratory system: Decreased breath sounds at bases bilaterally with some crackles, no wheezing  cardiovascular system: Rate controlled, S1-S2 heard Gastrointestinal system: Abdomen is nondistended, soft and mildly tender in the lower quadrant.  Bowel sounds are heard  extremities: Bilateral lower extremity edema present.  No clubbing  Central nervous system: Awake and alert.  No focal neurological deficits.  Moves extremities  skin: No obvious lesions/ecchymosis Psychiatry: Affect is flat    Data Reviewed: I have personally reviewed following labs and imaging studies  CBC: Recent Labs  Lab 12/23/19 0339 12/24/19 0510 12/25/19 0355 12/26/19 0350 12/27/19 0407  WBC 3.9* 3.7* 3.6* 3.5* 4.0  NEUTROABS 3.0 2.7 2.5 2.5 2.9  HGB 9.9* 10.0* 9.4* 9.2* 9.7*  HCT 30.9* 32.0* 30.5* 30.2* 31.5*  MCV 101.0* 102.6* 103.4* 103.8* 102.3*  PLT 204 216 202 204 045   Basic Metabolic Panel: Recent Labs  Lab 12/23/19 0339 12/24/19 0510 12/25/19 0355 12/26/19 0350 12/27/19 0407  NA 137 138 138 140 140  K 2.9* 3.8 3.8 3.9  3.6  CL 96* 101 103 108 102  CO2 32 _0 GLUCOSE 90 97 98 93 87  BUN 6 5* <5* <5* <5*  CREATININE 0.62 0.57 0.61 0.49 0.53  CALCIUM 8.4* 8.5* 8.4* 8.2* 8.1*  MG 2.1 2.1 2.1 2.0 1.9  PHOS  --   --   --  2.0*  --    GFR: Estimated Creatinine Clearance: 89.4 mL/min (by C-G formula based on SCr of 0.53 mg/dL). Liver Function Tests: Recent Labs  Lab 12/22/19 0315  AST 17  ALT 16  ALKPHOS 35*  BILITOT 0.8  PROT 5.6*  ALBUMIN 3.2*   No results for input(s): LIPASE, AMYLASE in the last 168 hours. No results for input(s): AMMONIA in the last 168 hours. Coagulation Profile: No results for input(s): INR, PROTIME in the last 168 hours. Cardiac Enzymes: No results for input(s): CKTOTAL, CKMB, CKMBINDEX, TROPONINI in the last 168 hours. BNP (last 3 results) Recent Labs    02/08/19 1029  PROBNP 32.0   HbA1C: No results for input(s): HGBA1C in the last 72  hours. CBG: Recent Labs  Lab 12/25/19 2056 12/26/19 0733 12/26/19 1125 12/26/19 1617 12/27/19 0700  GLUCAP 118* 78 114* 147* 86   Lipid Profile: No results for input(s): CHOL, HDL, LDLCALC, TRIG, CHOLHDL, LDLDIRECT in the last 72 hours. Thyroid Function Tests: No results for input(s): TSH, T4TOTAL, FREET4, T3FREE, THYROIDAB in the last 72 hours. Anemia Panel: Recent Labs    12/26/19 0350  VITAMINB12 1,253*  FOLATE 1.2*  FERRITIN 98  TIBC 210*  IRON 78  RETICCTPCT 3.0   Sepsis Labs: No results for input(s): PROCALCITON, LATICACIDVEN in the last 168 hours.  Recent Results (from the past 240 hour(s))  Urine culture     Status: Abnormal   Collection Time: 12/21/19  6:44 PM   Specimen: Urine, Clean Catch  Result Value Ref Range Status   Specimen Description   Final    URINE, CLEAN CATCH Performed at [facility], [place] [address] [place], [place] [address]    Special Requests   Final    NONE Performed at [facility], [place] [address] [place], [place] [address]     Culture (A)  Final    <10,000 COLONIES/mL INSIGNIFICANT GROWTH Performed at [place] [address] [place], [place] [address]    Report Status 12/23/2019 FINAL  Final  Blood culture (routine x 2)     Status: None   Collection Time: 12/21/19  9:40 PM   Specimen: BLOOD  Result Value Ref Range Status   Specimen Description   Final    BLOOD LEFT ANTECUBITAL Performed at [place] [address], [place], [place] [address]    Special Requests   Final    BOTTLES DRAWN AEROBIC AND ANAEROBIC Blood Culture adequate volume Performed at [place] [address], [place], [place] [address]    Culture   Final    NO GROWTH 5 DAYS Performed at [facility], [place] [address] [place], [place] [address]    Report Status 12/27/2019 FINAL  Final  Respiratory Panel by RT PCR (Flu A&B, Covid) - Nasopharyngeal Swab     Status: None   Collection Time: 12/21/19  9:50 PM   Specimen: Nasopharyngeal Swab  Result Value Ref Range Status   SARS Coronavirus 2 by RT PCR NEGATIVE NEGATIVE Final    Comment: (NOTE) SARS-CoV-2 target nucleic acids are NOT DETECTED.  The SARS-CoV-2 RNA is generally detectable in upper respiratoy specimens during the acute phase of infection. The lowest concentration of SARS-CoV-2 viral copies this assay can detect is 131 copies/mL. A negative result does not preclude SARS-Cov-2 infection and should not be used as the sole basis for treatment or other patient management decisions. A negative result may occur with  improper specimen collection/handling, submission of specimen other than nasopharyngeal swab, presence of viral mutation(s) within the areas targeted by this assay, and inadequate number of viral copies (<131 copies/mL). A negative result must be combined with clinical observations, patient history, and epidemiological information. The expected result is Negative.  Fact Sheet for Patients:   PinkCheek.be  Fact Sheet for Healthcare Providers:  GravelBags.it  This test is no t yet approved or cleared by the [place] FDA and  has been authorized for detection and/or diagnosis of SARS-CoV-2 by FDA under an Emergency Use Authorization (EUA). This EUA will remain  in effect (meaning this test can be used) for the duration of the COVID-19 declaration under Section 564(b)(1) of the Act, 21 U.S.C. section  360bbb-3(b)(1), unless the authorization is terminated or revoked sooner.     Influenza A by PCR NEGATIVE NEGATIVE Final   Influenza B by PCR NEGATIVE NEGATIVE Final    Comment: (NOTE) The Xpert Xpress SARS-CoV-2/FLU/RSV assay is intended as an aid in  the diagnosis of influenza from Nasopharyngeal swab specimens and  should not be used as a sole basis for treatment. Nasal washings and  aspirates are unacceptable for Xpert Xpress SARS-CoV-2/FLU/RSV  testing.  Fact Sheet for Patients: PinkCheek.be  Fact Sheet for Healthcare Providers: GravelBags.it  This test is not yet approved or cleared by the [place] FDA and  has been authorized for detection and/or diagnosis of SARS-CoV-2 by  FDA under an Emergency Use Authorization (EUA). This EUA will remain  in effect (meaning this test can be used) for the duration of the  Covid-19 declaration under Section 564(b)(1) of the Act, 21  U.S.C. section 360bbb-3(b)(1), unless the authorization is  terminated or revoked. Performed at [facility], [place] [address], [place], [place] [address]   Blood culture (routine x 2)     Status: None   Collection Time: 12/21/19  9:50 PM   Specimen: BLOOD  Result Value Ref Range Status   Specimen Description   Final    BLOOD RIGHT ANTECUBITAL Performed at [facility] [address], [place], [place] [address]    Special Requests    Final    BOTTLES DRAWN AEROBIC AND ANAEROBIC Blood Culture adequate volume Performed at [place] [address] [place], [place] [address]    Culture   Final    NO GROWTH 5 DAYS Performed at [facility], [place] [address], [place], [place] [address]    Report Status 12/27/2019 FINAL  Final  Culture, Urine     Status: None   Collection Time: 12/25/19  9:32 PM   Specimen: Urine, Clean Catch  Result Value Ref Range Status   Specimen Description   Final    URINE, CLEAN CATCH Performed at [facility], [place] [address] [place], [place] [address]    Special Requests   Final    NONE Performed at [facility], [place] [address] [place], [place] [address]    Culture   Final    NO GROWTH Performed at [facility], [place] [address] [place], [place] [address]    Report Status 12/26/2019 FINAL  Final         Radiology Studies: No results found.      Scheduled Meds: . carvedilol  6.25 mg Oral BID WC  . Chlorhexidine Gluconate Cloth  6 each Topical Daily  . famotidine  40 mg Oral Daily  . folic acid  1 mg Oral Daily  . furosemide  40 mg Oral Daily  . gabapentin  100 mg Oral BID  . heparin  5,000 Units Subcutaneous Q8H  . miconazole nitrate   Topical BID  . montelukast  10 mg Oral QPC supper  . Nintedanib  150 mg Oral BID  . Pirfenidone  1 tablet Oral TID WC  . potassium chloride  20 mEq Oral Daily  . predniSONE  10 mg Oral Q breakfast  . saccharomyces boulardii  250 mg Oral BID  . spironolactone  100 mg Oral Daily  . sulfamethoxazole-trimethoprim  1 tablet Oral Q M,W,F  . thyroid  90 mg Oral QAC breakfast   Continuous Infusions: . ertapenem 1,000 mg (12/26/19 1501)  . methocarbamol (ROBAXIN)  IV Stopped (12/26/19 2305)          Aline August, MD [facility] 12/27/2019, 7:58 AM

## 2019-12-27 NOTE — Progress Notes (Signed)
Patient has medications at bedside, nintedanib and pirfenidone, she sates that they were supposed to take to pharmacy and never did.  She states she would prefer to keep at bedside because she is being discharged  in the AM. MD and CN notified.

## 2019-12-28 LAB — BASIC METABOLIC PANEL
Anion gap: 11 (ref 5–15)
BUN: 7 mg/dL (ref 6–20)
CO2: 33 mmol/L — ABNORMAL HIGH (ref 22–32)
Calcium: 8.5 mg/dL — ABNORMAL LOW (ref 8.9–10.3)
Chloride: 94 mmol/L — ABNORMAL LOW (ref 98–111)
Creatinine, Ser: 0.54 mg/dL (ref 0.44–1.00)
GFR, Estimated: >60 mL/min (ref >60)
Glucose, Bld: 104 mg/dL — ABNORMAL HIGH (ref 70–99)
Potassium: 3 mmol/L — ABNORMAL LOW (ref 3.5–5.1)
Sodium: 138 mmol/L (ref 135–145)

## 2019-12-28 LAB — CBC WITH DIFFERENTIAL/PLATELET
Abs Immature Granulocytes: 0.12 10*3/uL — ABNORMAL HIGH (ref 0.00–0.07)
Basophils Absolute: 0 10*3/uL (ref 0.0–0.1)
Basophils Relative: 1 %
Eosinophils Absolute: 0.1 10*3/uL (ref 0.0–0.5)
Eosinophils Relative: 3 %
HCT: 33.9 % — ABNORMAL LOW (ref 36.0–46.0)
Hemoglobin: 10.5 g/dL — ABNORMAL LOW (ref 12.0–15.0)
Immature Granulocytes: 3 %
Lymphocytes Relative: 10 %
Lymphs Abs: 0.4 10*3/uL — ABNORMAL LOW (ref 0.7–4.0)
MCH: 31.3 pg (ref 26.0–34.0)
MCHC: 31 g/dL (ref 30.0–36.0)
MCV: 101.2 fL — ABNORMAL HIGH (ref 80.0–100.0)
Monocytes Absolute: 0.5 10*3/uL (ref 0.1–1.0)
Monocytes Relative: 11 %
Neutro Abs: 3.1 10*3/uL (ref 1.7–7.7)
Neutrophils Relative %: 72 %
Platelets: 221 10*3/uL (ref 150–400)
RBC: 3.35 MIL/uL — ABNORMAL LOW (ref 3.87–5.11)
RDW: 15.6 % — ABNORMAL HIGH (ref 11.5–15.5)
WBC: 4.3 10*3/uL (ref 4.0–10.5)
nRBC: 0 % (ref 0.0–0.2)

## 2019-12-28 LAB — GLUCOSE, CAPILLARY
Glucose-Capillary: 88 mg/dL (ref 70–99)
Glucose-Capillary: 99 mg/dL (ref 70–99)
Glucose-Capillary: 126 mg/dL — ABNORMAL HIGH (ref 70–99)

## 2019-12-28 LAB — MAGNESIUM: Magnesium: 1.9 mg/dL (ref 1.7–2.4)

## 2019-12-28 MED ORDER — FOLIC ACID 1 MG PO TABS
1.0000 mg | ORAL_TABLET | Freq: Every day | ORAL | 0 refills | Status: DC
Start: 2019-12-29 — End: 2020-02-05

## 2019-12-28 MED ORDER — INFLUENZA VAC A&B SA ADJ QUAD 0.5 ML IM PRSY
0.5000 mL | PREFILLED_SYRINGE | Freq: Once | INTRAMUSCULAR | Status: AC
  Administered 2019-12-28: 0.5 mL via INTRAMUSCULAR
  Filled 2019-12-28: qty 0.5

## 2019-12-28 MED ORDER — POTASSIUM CHLORIDE CRYS ER 20 MEQ PO TBCR
40.0000 meq | EXTENDED_RELEASE_TABLET | ORAL | Status: DC
  Administered 2019-12-28: 40 meq via ORAL
  Filled 2019-12-28: qty 2

## 2019-12-28 MED ORDER — FUROSEMIDE 40 MG PO TABS
40.0000 mg | ORAL_TABLET | Freq: Every day | ORAL | Status: DC
  Administered 2019-12-28: 40 mg via ORAL
  Filled 2019-12-28: qty 1

## 2019-12-28 MED ORDER — HEPARIN SOD (PORK) LOCK FLUSH 100 UNIT/ML IV SOLN
500.0000 [IU] | INTRAVENOUS | Status: AC | PRN
  Administered 2019-12-28: 500 [IU]
  Filled 2019-12-28: qty 5

## 2019-12-28 MED ORDER — PREDNISONE 10 MG PO TABS: 10.0000 mg | ORAL_TABLET | Freq: Every day | ORAL | Status: AC

## 2019-12-28 NOTE — Progress Notes (Signed)
Pt to be discharged to home this afternoon. Pt and Pt's Husband given discharge instructions including all Medications and schedules for these Medications. Understanding verbalized by Pt and Pt's Husband. Discharge packet with pt at time of discharge

## 2019-12-28 NOTE — Discharge Summary (Signed)
Physician Discharge Summary  Tasha Ballard:[id number] DOB: 1970/04/26 DOA: 12/21/2019  PCP: [name], MD  Admit date: 12/21/2019 Discharge date: 12/28/2019  Admitted From: Home Disposition: Home  Recommendations for Outpatient Follow-up:  1. Follow up with PCP in 1 week with repeat CBC/BMP 2. Outpatient follow-up with Dr. [name] as scheduled 3. Consider outpatient evaluation and follow-up by urology for recurrent UTIs. 4. Follow up in ED if symptoms worsen or new appear   Home Health: No Equipment/Devices: Continue supplemental oxygen via nasal cannula as normally uses at home  Discharge Condition: Guarded CODE STATUS: Full Diet recommendation: Heart healthy/carb modified  Brief/Interim Summary: 49 year old female with history of vitamin D deficiency, thyroid disease, advanced pulmonary fibrosis/chronic respiratory failure on 10 L oxygen at home, hypertension, diabetes mellitus type 2, recurrent UTIs presented with dysuria.  Patient was being treated with oral ciprofloxacin as an outpatient.  Subsequently urine culture showed multidrug resistance but sensitive to Augmentin for which she was prescribed Augmentin.  After finishing Augmentin, she started having dysuria again 5 6 days later.  She had a repeat urine culture which showed multidrug-resistant Proteus mirabilis for which patient was advised for hospitalization for IV antibiotics.  On presentation, vitals were stable; UA was positive for nitrites.  She was started on meropenem.  CT of abdomen/pelvis was negative for obstruction or pyelonephritis but showed thickened appendix without periappendiceal inflammation or fluid but still some concern for possible early acute appendicitis.  General surgery was consulted and recommended conservative management.  Subsequently general surgery signed off.  Patient has completed 7 days of IV antibiotic treatment.  She will be discharged home today with outpatient follow-up with  pulmonary.   Discharge Diagnoses:   ESBL Proteus mirabilis complicated UTI History of recurrent UTIs -Outside urine culture was positive for ESBL Proteus mirabilis -Patient was initially started on meropenem but she was switched to IV ertapenem.  Today is day #7 of IV antibiotics.  Will not need any more antibiotics on discharge. -CT abdomen and pelvis was negative for abscess or pyelonephritis.  Consider outpatient evaluation and follow-up by urology if condition recurs. -Currently afebrile and hemodynamically stable.  Discharge patient home today.  Possible acute appendicitis -General surgery following and have signed off.  General surgery recommended conservative management.  No need for further CAT scan as per general surgery  Diarrhea -Improving.   Chronic hypoxic respiratory failure Advanced pulmonary fibrosis -Patient is on 10 L oxygen via nasal cannula.  Continue oxygen supplementation.  Continue Ofev and pirfenidone.  Outpatient follow-up with pulmonary/Dr. [name].  Recommend outpatient goals of care discussion with palliative care -Oral Lasix was resumed on 12/26/2019, patient takes 40 mg daily and additional dose as needed.  Patient was given an extra dose of Lasix on 12/27/2019. -Resume current regimen of Lasix and supplemental potassium.  Outpatient follow-up with Dr. [name].  Hypokalemia -Continue supplementation  Hypothyroidism -Continue supplementation  Hypertension--continue Coreg  Candidiasis of the skin -Treated with topical antifungals.  Anemia of chronic disease -Hemoglobin stable.  No signs of bleeding.  Folic acid deficiency Macrocytosis -Folic acid level is 1.2.  Vitamin B12 1253.  Continue folic acid supplementation  Leukopenia -Probably from infection.  Resolved.  Generalized conditioning -Patient tolerated PT   Discharge Instructions  Discharge Instructions    Diet - low sodium heart healthy   Complete by: As directed     Diet Carb Modified   Complete by: As directed    Increase activity slowly   Complete by: As directed  Allergies as of 12/28/2019      Reactions   Macrobid [nitrofurantoin Macrocrystal] Nausea And Vomiting   Adhesive [tape] Rash      Medication List    STOP taking these medications   ciprofloxacin 250 MG tablet Commonly known as: CIPRO   LORazepam 0.5 MG tablet Commonly known as: Ativan   Magnesium Oxide 400 (240 Mg) MG Tabs     TAKE these medications   Aldactone 100 MG tablet Generic drug: spironolactone Take 100 mg by mouth daily.   azathioprine 100 MG tablet Commonly known as: IMURAN Take 1 tablet (100 mg total) by mouth daily.   benzonatate 200 MG capsule Commonly known as: TESSALON Take 1 capsule (200 mg total) by mouth 3 (three) times daily as needed for cough.   carvedilol 6.25 MG tablet Commonly known as: COREG Take 6.25 mg by mouth 2 (two) times daily with a meal.   Esbriet 801 MG Tabs Generic drug: Pirfenidone TAKE 1 TABLET BY MOUTH 3 (THREE) TIMES DAILY WITH MEALS.   famotidine 40 MG tablet Commonly known as: PEPCID TAKE 1 TABLET BY MOUTH EVERY DAY   folic acid 1 MG tablet Commonly known as: FOLVITE Take 1 tablet (1 mg total) by mouth daily. Start taking on: December 29, 2019   furosemide 40 MG tablet Commonly known as: LASIX Take 40 mg by mouth See admin instructions. Take 1 tablet (40 mg) daily, may repeat dose if needed for fluid retention.   gabapentin 100 MG capsule Commonly known as: NEURONTIN Take 1 capsule by mouth daily as needed (For pain).   metFORMIN 500 MG tablet Commonly known as: GLUCOPHAGE Take 500 mg by mouth 2 (two) times daily.   montelukast 10 MG tablet Commonly known as: SINGULAIR Take 1 tablet (10 mg total) by mouth daily after supper.   Ofev 150 MG Caps Generic drug: Nintedanib TAKE 1 CAPSULE TWICE A DAY (12 HOURS APART) WITH FOOD What changed: See the new instructions.   ondansetron 8 MG tablet Commonly  known as: ZOFRAN TAKE 1 TABLET BY MOUTH EVERY 8 HOURS AS NEEDED FOR NAUSEA AND VOMITING What changed:   how much to take  how to take this  when to take this  reasons to take this  additional instructions   potassium chloride 10 MEQ tablet Commonly known as: KLOR-CON Take 3 tablets (30 mEq total) by mouth daily.   predniSONE 10 MG tablet Commonly known as: DELTASONE Take 1 tablet (10 mg total) by mouth daily. What changed:   how much to take  how to take this  when to take this  additional instructions  Another medication with the same name was removed. Continue taking this medication, and follow the directions you see here.   ProAir HFA 108 (90 Base) MCG/ACT inhaler Generic drug: albuterol Inhale 2 puffs into the lungs every 6 (six) hours as needed for wheezing or shortness of breath.   saccharomyces boulardii 250 MG capsule Commonly known as: Florastor Take 1 capsule (250 mg total) by mouth 2 (two) times daily.   sulfamethoxazole-trimethoprim 400-80 MG tablet Commonly known as: BACTRIM Take 1 tablet by mouth every Monday, Wednesday, and Friday.   thyroid 90 MG tablet Commonly known as: ARMOUR Take 90 mg by mouth daily before breakfast.   Vitamin D (Ergocalciferol) 1.25 MG (50000 UNIT) Caps capsule Commonly known as: DRISDOL TAKE 1 CAPSULE BY MOUTH ONCE WEEKLY What changed: when to take this       Follow-up Information    [name], MD.  Schedule an appointment as soon as possible for a visit in 1 week(s).   Specialty: Family Medicine Contact information: [address] [place] [address] [phone]        Brand Males, MD Follow up.   Specialty: Pulmonary Disease Why: Keep scheduled appointment Contact information: [place] [address] [place] [address] [phone]              Allergies  Allergen Reactions  . Macrobid [Nitrofurantoin Macrocrystal] Nausea And Vomiting  . Adhesive [Tape] Rash     Consultations:  None   Procedures/Studies: CT ABDOMEN PELVIS W CONTRAST  Result Date: 12/22/2019 CLINICAL DATA:  Pyelonephritis complicated urinary tract infection. History of lung cancer with radiation treatment. History of celiac artery aneurysm. LOWER back pain for 6-8 weeks. EXAM: CT ABDOMEN AND PELVIS WITH CONTRAST TECHNIQUE: Multidetector CT imaging of the abdomen and pelvis was performed using the standard protocol following bolus administration of intravenous contrast. CONTRAST:  156m OMNIPAQUE IOHEXOL 300 MG/ML  SOLN COMPARISON:  Plain film 12/22/2019 and CT 10/01/2019, 10/30/2010 FINDINGS: Lower chest: Are stable, chronic changes in the lungs, involving the lingula, RIGHT middle lobe, and both LOWER lobes. In these regions there is diffuse ground-glass opacity, bronchiolectasis, and honeycomb changes. No pleural effusions. Heart size is normal. Hepatobiliary: No focal liver abnormality is seen. No radiopaque gallstones, biliary dilatation, or pericholecystic inflammatory changes. Pancreas: Unremarkable. No pancreatic ductal dilatation or surrounding inflammatory changes. Spleen: Normal in size without focal abnormality. Adrenals/Urinary Tract: Adrenal glands are normal. Kidneys are symmetric in size and enhancement. Appropriate excretion bilaterally. There is a 1.2 centimeter cyst in the LATERAL UPPER pole region of the LEFT kidney. No solid mass, hydronephrosis, or perinephric stranding. The ureters are unremarkable. The bladder and visualized portion of the urethra are normal. Stomach/Bowel: Stomach is unremarkable. There is artifact in the region of the distal stomach and duodenal from vascular coils. Small bowel loops are unremarkable. The appendix is mildly thickened, measuring 10 millimeters on image 45 of series 7. There is no periappendiceal inflammatory change or fluid. Vascular/Lymphatic: Abdominal aorta is partially calcified. Celiac stent graft. Numerous vascular coils along the  MEDIAL wall of the stomach and duodenum. No adenopathy. Reproductive: Uterus is present.  No adnexal mass. Other: No free pelvic fluid. Subcutaneous injection sites in the anterior abdominal wall. Musculoskeletal: No acute or significant osseous findings. IMPRESSION: 1. No evidence for urinary tract obstruction or pyelonephritis. 2. Mildly thickened appendix without periappendiceal inflammatory change or fluid. Findings raise a question for early acute appendicitis. Recommend correlation with targeted physical exam. 3. Stable chronic changes in the lungs. 4. Celiac stent graft.  Vascular coils in the RIGHT UPPER QUADRANT. 5. Aortic Atherosclerosis (ICD10-I70.0). These results will be called to the ordering clinician or representative by the Radiologist Assistant, and communication documented in the PACS or [employer] Electronically Signed   By: ENolon NationsM.D.   On: 12/22/2019 16:45       Subjective: Patient seen and examined at bedside.  She feels better enough to go home today.  Denies worsening shortness of breath.  Lost some more weight with Lasix.  No overnight fever or vomiting reported.  Discharge Exam: Vitals:   12/27/19 2118 12/28/19 0525  BP: 117/83 (!) 147/88  Pulse: (!) 106 98  Resp: 20 20  Temp: 98.2 F (36.8 C) 99.1 F (37.3 C)  SpO2: 97% 96%    General: Pt is alert, awake, not in acute distress.  Currently on 7 L oxygen via nasal  cannula Cardiovascular: rate controlled, S1/S2 + Respiratory: bilateral decreased breath sounds at bases with some scattered crackles Abdominal: Soft, obese, NT, ND, bowel sounds + Extremities: Trace lower extremity edema present; no cyanosis    The results of significant diagnostics from this hospitalization (including imaging, microbiology, ancillary and laboratory) are listed below for reference.     Microbiology: Recent Results (from the past 240 hour(s))  Urine culture     Status: Abnormal   Collection Time: 12/21/19  6:44  PM   Specimen: Urine, Clean Catch  Result Value Ref Range Status   Specimen Description   Final    URINE, CLEAN CATCH Performed at [facility], [place] [address], [place], [place] [address]    Special Requests   Final    NONE Performed at [facility], [place] [address] [place], [place] [address]    Culture (A)  Final    <10,000 COLONIES/mL INSIGNIFICANT GROWTH Performed at [place] [address] [place], [place] [address]    Report Status 12/23/2019 FINAL  Final  Blood culture (routine x 2)     Status: None   Collection Time: 12/21/19  9:40 PM   Specimen: BLOOD  Result Value Ref Range Status   Specimen Description   Final    BLOOD LEFT ANTECUBITAL Performed at [place] [address], [place], [place] [address]    Special Requests   Final    BOTTLES DRAWN AEROBIC AND ANAEROBIC Blood Culture adequate volume Performed at [place] [address] [place], [place] [address]    Culture   Final    NO GROWTH 5 DAYS Performed at [facility], [place] [address], [place], [place] [address]    Report Status 12/27/2019 FINAL  Final  Respiratory Panel by RT PCR (Flu A&B, Covid) - Nasopharyngeal Swab     Status: None   Collection Time: 12/21/19  9:50 PM   Specimen: Nasopharyngeal Swab  Result Value Ref Range Status   SARS Coronavirus 2 by RT PCR NEGATIVE NEGATIVE Final    Comment: (NOTE) SARS-CoV-2 target nucleic acids are NOT DETECTED.  The SARS-CoV-2 RNA is generally detectable in upper respiratoy specimens during the acute phase of infection. The lowest concentration of SARS-CoV-2 viral copies this assay can detect is 131 copies/mL. A negative result does not preclude SARS-Cov-2 infection and should not be used as the sole basis for treatment or other patient management decisions. A negative result may occur with  improper specimen collection/handling, submission of specimen  other than nasopharyngeal swab, presence of viral mutation(s) within the areas targeted by this assay, and inadequate number of viral copies (<131 copies/mL). A negative result must be combined with clinical observations, patient history, and epidemiological information. The expected result is Negative.  Fact Sheet for Patients:  PinkCheek.be  Fact Sheet for Healthcare Providers:  GravelBags.it  This test is no t yet approved or cleared by the [place] FDA and  has been authorized for detection and/or diagnosis of SARS-CoV-2 by FDA under an Emergency Use Authorization (EUA). This EUA will remain  in effect (meaning this test can be used) for the duration of the COVID-19 declaration under Section 564(b)(1) of the Act, 21 U.S.C. section 360bbb-3(b)(1), unless the authorization is terminated or revoked sooner.     Influenza A by PCR NEGATIVE NEGATIVE Final   Influenza B by PCR NEGATIVE NEGATIVE Final    Comment: (NOTE) The Xpert Xpress SARS-CoV-2/FLU/RSV assay is intended as  an aid in  the diagnosis of influenza from Nasopharyngeal swab specimens and  should not be used as a sole basis for treatment. Nasal washings and  aspirates are unacceptable for Xpert Xpress SARS-CoV-2/FLU/RSV  testing.  Fact Sheet for Patients: PinkCheek.be  Fact Sheet for Healthcare Providers: GravelBags.it  This test is not yet approved or cleared by the [place] FDA and  has been authorized for detection and/or diagnosis of SARS-CoV-2 by  FDA under an Emergency Use Authorization (EUA). This EUA will remain  in effect (meaning this test can be used) for the duration of the  Covid-19 declaration under Section 564(b)(1) of the Act, 21  U.S.C. section 360bbb-3(b)(1), unless the authorization is  terminated or revoked. Performed at [facility], [place] [address], [place], [place] [address]   Blood culture (routine x 2)     Status: None   Collection Time: 12/21/19  9:50 PM   Specimen: BLOOD  Result Value Ref Range Status   Specimen Description   Final    BLOOD RIGHT ANTECUBITAL Performed at [place] [address], [place], [place] [address]    Special Requests   Final    BOTTLES DRAWN AEROBIC AND ANAEROBIC Blood Culture adequate volume Performed at [place] [address], [place], [place] [address]    Culture   Final    NO GROWTH 5 DAYS Performed at [facility], [place] [address] [place], [place] [address]    Report Status 12/27/2019 FINAL  Final  Culture, Urine     Status: None   Collection Time: 12/25/19  9:32 PM   Specimen: Urine, Clean Catch  Result Value Ref Range Status   Specimen Description   Final    URINE, CLEAN CATCH Performed at [facility], [place] [address] [place], [place] [address]    Special Requests   Final    NONE Performed at [facility], [place] [address] [place], [place] [address]    Culture   Final    NO GROWTH Performed at [facility], [place] [address], [place], [place] [address]    Report Status 12/26/2019 FINAL  Final     Labs: BNP (last 3 results) No results for input(s): BNP in the last 8760 hours. Basic Metabolic Panel: Recent Labs  Lab 12/24/19 0510 12/25/19 0355 12/26/19 0350 12/27/19 0407 12/28/19 0340  NA 138 138 140 140 138  K 3.8 3.8 3.9 3.6 3.0*  CL 101 103 108 102 94*  CO2 _0 33*  GLUCOSE 97 98 93 87 104*  BUN 5* <5* <5* <5* 7  CREATININE 0.57 0.61 0.49 0.53 0.54  CALCIUM 8.5* 8.4* 8.2* 8.1* 8.5*  MG 2.1 2.1 2.0 1.9 1.9  PHOS  --   --  2.0*  --   --    Liver Function Tests: Recent Labs  Lab 12/22/19 0315  AST 17  ALT 16  ALKPHOS 35*  BILITOT 0.8  PROT 5.6*  ALBUMIN 3.2*   No results for input(s): LIPASE, AMYLASE in the last 168 hours. No results for input(s):  AMMONIA in the last 168 hours. CBC: Recent Labs  Lab 12/24/19 0510 12/25/19 0355 12/26/19 0350 12/27/19 0407 12/28/19 0340  WBC 3.7* 3.6* 3.5* 4.0 4.3  NEUTROABS 2.7 2.5 2.5 2.9 3.1  HGB 10.0* 9.4* 9.2* 9.7* 10.5*  HCT 32.0* 30.5* 30.2* 31.5* 33.9*  MCV 102.6* 103.4* 103.8* 102.3* 101.2*  PLT 216 202 204  214 221   Cardiac Enzymes: No results for input(s): CKTOTAL, CKMB, CKMBINDEX, TROPONINI in the last 168 hours. BNP: Invalid input(s): POCBNP CBG: Recent Labs  Lab 12/27/19 0700 12/27/19 1052 12/27/19 1627 12/28/19 0612 12/28/19 0735  GLUCAP 86 160* 140* 88 99   D-Dimer No results for input(s): DDIMER in the last 72 hours. Hgb A1c No results for input(s): HGBA1C in the last 72 hours. Lipid Profile No results for input(s): CHOL, HDL, LDLCALC, TRIG, CHOLHDL, LDLDIRECT in the last 72 hours. Thyroid function studies No results for input(s): TSH, T4TOTAL, T3FREE, THYROIDAB in the last 72 hours.  Invalid input(s): FREET3 Anemia work up Recent Labs    12/26/19 0350  VITAMINB12 1,253*  FOLATE 1.2*  FERRITIN 98  TIBC 210*  IRON 78  RETICCTPCT 3.0   Urinalysis    Component Value Date/Time   COLORURINE AMBER (A) 12/24/2019 1233   APPEARANCEUR CLEAR 12/24/2019 1233   LABSPEC 1.010 12/24/2019 1233   PHURINE 6.0 12/24/2019 1233   GLUCOSEU NEGATIVE 12/24/2019 1233   GLUCOSEU NEGATIVE 05/15/2019 1040   HGBUR NEGATIVE 12/24/2019 1233   BILIRUBINUR NEGATIVE 12/24/2019 1233   KETONESUR 5 (A) 12/24/2019 1233   PROTEINUR NEGATIVE 12/24/2019 1233   UROBILINOGEN 0.2 05/15/2019 1040   NITRITE POSITIVE (A) 12/24/2019 1233   LEUKOCYTESUR NEGATIVE 12/24/2019 1233   Sepsis Labs Invalid input(s): PROCALCITONIN,  WBC,  LACTICIDVEN Microbiology Recent Results (from the past 240 hour(s))  Urine culture     Status: Abnormal   Collection Time: 12/21/19  6:44 PM   Specimen: Urine, Clean Catch  Result Value Ref Range Status   Specimen Description   Final    URINE, CLEAN  CATCH Performed at [facility], [place] [address] [place], [place] [address]    Special Requests   Final    NONE Performed at [facility], [place] [address], [place], [place] [address]    Culture (A)  Final    <10,000 COLONIES/mL INSIGNIFICANT GROWTH Performed at [place] [address] [place], [place] [address]    Report Status 12/23/2019 FINAL  Final  Blood culture (routine x 2)     Status: None   Collection Time: 12/21/19  9:40 PM   Specimen: BLOOD  Result Value Ref Range Status   Specimen Description   Final    BLOOD LEFT ANTECUBITAL Performed at [place] [address], [place], [place] [address]    Special Requests   Final    BOTTLES DRAWN AEROBIC AND ANAEROBIC Blood Culture adequate volume Performed at [place] [address], [place], [place] [address]    Culture   Final    NO GROWTH 5 DAYS Performed at [facility], [place] [address] [place], [place] [address]    Report Status 12/27/2019 FINAL  Final  Respiratory Panel by RT PCR (Flu A&B, Covid) - Nasopharyngeal Swab     Status: None   Collection Time: 12/21/19  9:50 PM   Specimen: Nasopharyngeal Swab  Result Value Ref Range Status   SARS Coronavirus 2 by RT PCR NEGATIVE NEGATIVE Final    Comment: (NOTE) SARS-CoV-2 target nucleic acids are NOT DETECTED.  The SARS-CoV-2 RNA is generally detectable in upper respiratoy specimens during the acute phase of infection. The lowest concentration of SARS-CoV-2 viral copies this assay can detect is 131 copies/mL. A negative result does not preclude SARS-Cov-2 infection and should not be used as the sole basis for treatment or other patient management  decisions. A negative result may occur with  improper specimen collection/handling, submission of specimen other than nasopharyngeal swab, presence of viral mutation(s) within the areas targeted by this assay, and inadequate  number of viral copies (<131 copies/mL). A negative result must be combined with clinical observations, patient history, and epidemiological information. The expected result is Negative.  Fact Sheet for Patients:  PinkCheek.be  Fact Sheet for Healthcare Providers:  GravelBags.it  This test is no t yet approved or cleared by the [place] FDA and  has been authorized for detection and/or diagnosis of SARS-CoV-2 by FDA under an Emergency Use Authorization (EUA). This EUA will remain  in effect (meaning this test can be used) for the duration of the COVID-19 declaration under Section 564(b)(1) of the Act, 21 U.S.C. section 360bbb-3(b)(1), unless the authorization is terminated or revoked sooner.     Influenza A by PCR NEGATIVE NEGATIVE Final   Influenza B by PCR NEGATIVE NEGATIVE Final    Comment: (NOTE) The Xpert Xpress SARS-CoV-2/FLU/RSV assay is intended as an aid in  the diagnosis of influenza from Nasopharyngeal swab specimens and  should not be used as a sole basis for treatment. Nasal washings and  aspirates are unacceptable for Xpert Xpress SARS-CoV-2/FLU/RSV  testing.  Fact Sheet for Patients: PinkCheek.be  Fact Sheet for Healthcare Providers: GravelBags.it  This test is not yet approved or cleared by the [place] FDA and  has been authorized for detection and/or diagnosis of SARS-CoV-2 by  FDA under an Emergency Use Authorization (EUA). This EUA will remain  in effect (meaning this test can be used) for the duration of the  Covid-19 declaration under Section 564(b)(1) of the Act, 21  U.S.C. section 360bbb-3(b)(1), unless the authorization is  terminated or revoked. Performed at [facility], [place] [address], [place], [place] [address]   Blood culture (routine x 2)     Status: None   Collection Time: 12/21/19  9:50 PM    Specimen: BLOOD  Result Value Ref Range Status   Specimen Description   Final    BLOOD RIGHT ANTECUBITAL Performed at [place] [address] [place], [place] [address]    Special Requests   Final    BOTTLES DRAWN AEROBIC AND ANAEROBIC Blood Culture adequate volume Performed at [place] [address] [place], [place] [address]    Culture   Final    NO GROWTH 5 DAYS Performed at [facility], [place] [address], [place], [place] [address]    Report Status 12/27/2019 FINAL  Final  Culture, Urine     Status: None   Collection Time: 12/25/19  9:32 PM   Specimen: Urine, Clean Catch  Result Value Ref Range Status   Specimen Description   Final    URINE, CLEAN CATCH Performed at [facility], [place] [address], [place], [place] [address]    Special Requests   Final    NONE Performed at [facility], [place] [address], [place], [place] [address]    Culture   Final    NO GROWTH Performed at [facility], [place] [address], [place], [place] [address]    Report Status 12/26/2019 FINAL  Final     Time coordinating discharge: 35 minutes  SIGNED:   Aline August, MD  [facility] 12/28/2019, 10:50 AM

## 2020-01-01 ENCOUNTER — Encounter (HOSPITAL_COMMUNITY): Admission: RE | Admit: 2020-01-01 | Payer: Self-pay | Source: Ambulatory Visit

## 2020-01-02 ENCOUNTER — Encounter: Payer: BC Managed Care – PPO | Admitting: *Deleted

## 2020-01-02 ENCOUNTER — Ambulatory Visit (INDEPENDENT_AMBULATORY_CARE_PROVIDER_SITE_OTHER): Payer: BC Managed Care – PPO | Admitting: Adult Health

## 2020-01-02 ENCOUNTER — Encounter: Payer: Self-pay | Admitting: Adult Health

## 2020-01-02 ENCOUNTER — Other Ambulatory Visit: Payer: Self-pay

## 2020-01-02 VITALS — BP 130/90 | HR 113 | Temp 98.0°F | Ht 62.0 in | Wt 198.4 lb

## 2020-01-02 DIAGNOSIS — R935 Abnormal findings on diagnostic imaging of other abdominal regions, including retroperitoneum: Secondary | ICD-10-CM

## 2020-01-02 DIAGNOSIS — N390 Urinary tract infection, site not specified: Secondary | ICD-10-CM | POA: Diagnosis not present

## 2020-01-02 DIAGNOSIS — C3432 Malignant neoplasm of lower lobe, left bronchus or lung: Secondary | ICD-10-CM

## 2020-01-02 DIAGNOSIS — J9611 Chronic respiratory failure with hypoxia: Secondary | ICD-10-CM

## 2020-01-02 DIAGNOSIS — K37 Unspecified appendicitis: Secondary | ICD-10-CM

## 2020-01-02 DIAGNOSIS — J849 Interstitial pulmonary disease, unspecified: Secondary | ICD-10-CM | POA: Diagnosis not present

## 2020-01-02 MED FILL — ESBRIET 801 MG TABS: 801 | 30 days supply | Qty: 90 | Fill #5

## 2020-01-02 NOTE — Research (Signed)
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 The consent for this encounter is under Protocol Version Amendment 4 [phone]) and is currently IRB approved. Subject expressed continued interest and consent in continuing as a study subject. Subject confirmed that there were no changes in contact information (e.g. address, telephone, email). Subject thanked for participation in research and contribution to science. Refer to the subject's paper source binder for additional information on this visit.  During this visiton11/11/2019, the subject completed the blood work and questionnaires per the above referenced protocol. Please refer to the subject's paper source binder for further details  Signed by  [place], [place] 1:17 PM 01/02/2020

## 2020-01-02 NOTE — Patient Instructions (Addendum)
Referral to GYN to establish. Referral to surgery -Dr. Donne Hazel for abnormal appendix.  We will check a urine culture today  Please drink plenty of water.  Urinate frequently and empty your bladder often. Saline nasal spray and gel As needed   Once urine cultures are returned will decide on Imuran restart date .  Continue on Ofev 150 mg twice daily Continue on prednisone 10 mg daily Esbriet as directed.  Continue on oxygen-goal is to keep oxygen levels greater than 88 to 90% Continue with Pulmonary rehab  CT chest (no contrast) in 4 weeks  Work on healthy weight loss.  Follow-up in 4  weeks with Dr. [name] in ILD clinic with Spirometry with DLCO and As needed   Please contact office for sooner follow up if symptoms do not improve or worsen or seek emergency care

## 2020-01-02 NOTE — Progress Notes (Signed)
_0  ID: Tasha Ballard, female    DOB: Apr 21, 1970, 49 y.o.   MRN: [id number]  Chief Complaint  Patient presents with  . Hospitalization Follow-up    Referring provider: [name], MD  HPI: 49 year old female never smoker seen for pulmonary consult October 2019 to establish for interstitial lung disease.  She has previously been followed at [facility]  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  She was initially placed on CellCept.  In 2016 had significant clinical decline and was started on oxygen.  She has also been seen at [facility] with Dr. 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  She has a very complicated medical history.  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  Since that time she had progressive respiratory decline and increased symptom burden. She was treated 1 full year with oral and IV Cytoxan from December 2019 to November 2020.  In December 2019 she was started on Ofev twice daily.  She was started on Imuran in January 2021.  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  She was started on Esbriet in March 2021.  She is on high flow oxygen 6 L at rest and 8 to 10 L with ambulation and exercise.  She is on chronic steroids with prednisone 10 mg daily.  Patient was found to have a 1 cm left lower lobe lung tumor measuring 1 cm on CT scan April 27, 2019.  Follow-up scan showed growth to 1.5 cm Jul 19, 2019.  PET scan was found to be markedly hypermetabolic with SUV of 7.7.  No distant disease was noted.  Patient was felt to be high risk for biopsy due to her severe interstitial lung disease and chronic respiratory failure with high oxygen demands.  Given this she was referred for consideration of stereotactic body radiation treatment.  Patient underwent SBRT, completed August 24, 2019.  Test /Events High-resolution CT chest April 27, 2019-stable UIP changes no progression since September 2020.  New 1 cm left lower lobe nodule  High-resolution CT chest Jul 19, 2019-interval growth of the left lower lobe nodule 1.5 cm.  UIP changes stable  CT chest October 02, 2019 stable UIP pattern.  Slight interval decrease in the left lung base nodule.  Consistent with treatment response.   01/02/20 Follow up: UIP , O2 RF , Post hospital follow-up, presumed lung cancer Patient returns for a follow-up visit.  She has a very complicated medical history with interstitial lung disease felt to be progressive NSIP.  She is on an aggressive maintenance regimen with Imuran, Esbriet and Ofev, chronic steroids with prednisone 10 mg daily.  She says overall breathing is doing okay.  She is had no increased cough congestion or shortness of breath.  Dyspnea is at baseline.  No increased oxygen demands.  She remains on 6 L at rest and 8 L with activity and exercise.  She is participating in pulmonary rehab.  Has not been recently due to hospitalization for severe drug-resistant UTI.  In March 2021 CT chest showed a new 1 cm left lower lobe nodule.  Close serial CT follow-up in May 2021 showed a growth of the left lower lobe nodule.  Patient was felt to be high risk for biopsy due to severe interstitial lung disease and high oxygen requirements.  She was referred to radiation oncology.  And completed SBRT in July 2021.  Recent CT chest in August showed a slight interval decrease in the left lung base nodule consistent with treatment response. Patient is due for follow-up CT next month.. She denies any hemoptysis, unintentional weight loss.   Patient was recently admitted earlier this month for recurrent urinary tract infection with outpatient failure of oral antibiotics.  Urine culture showed ESBL Proteus mirabilis.  She required hospitalization with IV antibiotics.  She was treated initially with meropenem and then switched to IV ertapenem she completed a full  7-day course. CT abdomen and pelvis negative for urinary tract obstruction or pyelonephritis.  Celiac stent graft.  Aortic atherosclerosis, mildly thickened appendix without periappendiceal inflammatory change or fluid. Patient was seen by general surgery.  Recommendations for conservative management and follow-up.  Patient is requesting a repeat CT abdomen to follow her appendix.  Patient denies any nausea vomiting diarrhea.  Appetite is fair. Since discharge patient says she is doing better she has had an occasional urinary urgency and dysuria.    Allergies  Allergen Reactions  . Macrobid [Nitrofurantoin Macrocrystal] Nausea And Vomiting  . Adhesive [Tape] Rash    Immunization History  Administered Date(s) Administered  . Fluad Quad(high Dose 65+) 12/28/2019  . Influenza,inj,Quad PF,6+ Mos 12/24/2014, 02/05/2016, 01/07/2017, 11/22/2017, 10/20/2018  . Influenza-Unspecified 12/24/2014, 02/05/2016, 01/07/2017, 11/22/2017  . PFIZER SARS-COV-2 Vaccination 04/06/2019, 04/30/2019, 10/18/2019  . Pneumococcal Conjugate-13 07/20/2017  . Pneumococcal Polysaccharide-23 06/09/2012, 01/03/2019  . Tdap 02/19/2011    Past Medical History:  Diagnosis Date  . Aneurysm ([place])   . Back pain   . Cancer ([place])   . Diabetes mellitus without complication ([place])   . H/O blood clots   . Hypertension   . Joint pain   . Pulmonary fibrosis ([place])   . SOB (shortness of breath)   . Swelling   . Thyroid disease   . Vitamin D deficiency     Tobacco History: Social History   Tobacco Use  Smoking Status Never Smoker  Smokeless Tobacco Never Used   Counseling given: Not Answered   Outpatient Medications Prior to Visit  Medication Sig Dispense Refill  . azathioprine (IMURAN) 100 MG tablet Take 1 tablet (100 mg total) by mouth daily. 90 tablet 3  . benzonatate (TESSALON) 200 MG capsule Take 1 capsule (200 mg total) by mouth 3 (three) times daily as needed for cough. 30 capsule 1  . carvedilol (COREG)  6.25 MG tablet Take 6.25 mg by mouth 2 (two) times daily with a meal.    . ESBRIET 801 MG TABS TAKE 1 TABLET BY MOUTH 3 (THREE) TIMES DAILY WITH MEALS. 90 tablet 11  . famotidine (PEPCID) 40 MG tablet TAKE 1 TABLET BY MOUTH EVERY DAY (Patient taking differently: Take 40 mg by mouth daily. ) 90 tablet 3  . folic acid (FOLVITE) 1 MG tablet Take 1 tablet (1 mg total) by mouth daily. 30 tablet 0  . furosemide (LASIX) 40 MG tablet Take 40 mg by mouth See admin instructions. Take 1 tablet (40 mg) daily, may repeat dose if needed for fluid retention.    . gabapentin (NEURONTIN) 100 MG capsule Take 1 capsule by mouth daily as needed (For pain).     . metFORMIN (GLUCOPHAGE) 500 MG tablet Take 500 mg by mouth 2 (two) times daily.     . montelukast (SINGULAIR) 10 MG tablet Take 1 tablet (10 mg total) by mouth daily after supper. 90 tablet 1  . OFEV 150 MG CAPS TAKE 1 CAPSULE TWICE A DAY (12 HOURS APART) WITH  FOOD (Patient taking differently: Take 150 mg by mouth in the morning and at bedtime. ) 60 capsule 11  . ondansetron (ZOFRAN) 8 MG tablet TAKE 1 TABLET BY MOUTH EVERY 8 HOURS AS NEEDED FOR NAUSEA AND VOMITING (Patient taking differently: Take 8 mg by mouth every 8 (eight) hours as needed for nausea or vomiting. ) 45 tablet 2  . potassium chloride (KLOR-CON) 10 MEQ tablet Take 3 tablets (30 mEq total) by mouth daily. 90 tablet 2  . predniSONE (DELTASONE) 10 MG tablet Take 1 tablet (10 mg total) by mouth daily.    Marland Kitchen PROAIR HFA 108 (90 Base) MCG/ACT inhaler Inhale 2 puffs into the lungs every 6 (six) hours as needed for wheezing or shortness of breath. 18 g 5  . saccharomyces boulardii (FLORASTOR) 250 MG capsule Take 1 capsule (250 mg total) by mouth 2 (two) times daily. 60 capsule 5  . spironolactone (ALDACTONE) 100 MG tablet Take 100 mg by mouth daily.    Marland Kitchen sulfamethoxazole-trimethoprim (BACTRIM) 400-80 MG tablet Take 1 tablet by mouth every Monday, Wednesday, and Friday.    . thyroid (ARMOUR) 90 MG tablet  Take 90 mg by mouth daily before breakfast.     . Vitamin D, Ergocalciferol, (DRISDOL) 1.25 MG (50000 UT) CAPS capsule TAKE 1 CAPSULE BY MOUTH ONCE WEEKLY (Patient taking differently: Take 50,000 Units by mouth every 7 (seven) days. ) 3 capsule 0   No facility-administered medications prior to visit.     Review of Systems:   Constitutional:   No  weight loss, night sweats,  Fevers, chills,  +fatigue, or  lassitude.  HEENT:   No headaches,  Difficulty swallowing,  Tooth/dental problems, or  Sore throat,                No sneezing, itching, ear ache, nasal congestion, post nasal drip,   CV:  No chest pain,  Orthopnea, PND, swelling in lower extremities, anasarca, dizziness, palpitations, syncope.   GI  No heartburn, indigestion, abdominal pain, nausea, vomiting, diarrhea, change in bowel habits, loss of appetite, bloody stools.   Resp:    No chest wall deformity  Skin: no rash or lesions.  GU: no dysuria, change in color of urine, no urgency or frequency.  No flank pain, no hematuria   MS:  No joint pain or swelling.  No decreased range of motion.  No back pain.    Physical Exam  BP 130/90 (BP Location: Left Arm, Patient Position: Sitting, Cuff Size: Large)   Pulse (!) 113   Temp 98 F (36.7 C) (Temporal)   Ht _0  (1.575 m)   Wt 198 lb 6.4 oz (90 kg)   SpO2 95%   BMI 36.29 kg/m   GEN: A/Ox3; pleasant , NAD, well nourished , on oxygen   HEENT:  [place]/AT,    NOSE-clear, THROAT-clear, no lesions, no postnasal drip or exudate noted.   NECK:  Supple w/ fair ROM; no JVD; normal carotid impulses w/o bruits; no thyromegaly or nodules palpated; no lymphadenopathy.    RESP  Clear  P & A; w/o, wheezes/ rales/ or rhonchi. no accessory muscle use, no dullness to percussion  CARD:  RRR, no m/r/g, no peripheral edema, pulses intact, no cyanosis or clubbing.  GI:   Soft & nt; nml bowel sounds; no organomegaly or masses detected.  No guarding noted  Musco: Warm bil, no deformities  or joint swelling noted.   Neuro: alert, no focal deficits noted.    Skin: Warm, no lesions  or rashes    Lab Results:  CBC  BNP No results found for: BNP   Imaging: CT ABDOMEN PELVIS W CONTRAST  Result Date: 12/22/2019 CLINICAL DATA:  Pyelonephritis complicated urinary tract infection. History of lung cancer with radiation treatment. History of celiac artery aneurysm. LOWER back pain for 6-8 weeks. EXAM: CT ABDOMEN AND PELVIS WITH CONTRAST TECHNIQUE: Multidetector CT imaging of the abdomen and pelvis was performed using the standard protocol following bolus administration of intravenous contrast. CONTRAST:  158m OMNIPAQUE IOHEXOL 300 MG/ML  SOLN COMPARISON:  Plain film 12/22/2019 and CT 10/01/2019, 10/30/2010 FINDINGS: Lower chest: Are stable, chronic changes in the lungs, involving the lingula, RIGHT middle lobe, and both LOWER lobes. In these regions there is diffuse ground-glass opacity, bronchiolectasis, and honeycomb changes. No pleural effusions. Heart size is normal. Hepatobiliary: No focal liver abnormality is seen. No radiopaque gallstones, biliary dilatation, or pericholecystic inflammatory changes. Pancreas: Unremarkable. No pancreatic ductal dilatation or surrounding inflammatory changes. Spleen: Normal in size without focal abnormality. Adrenals/Urinary Tract: Adrenal glands are normal. Kidneys are symmetric in size and enhancement. Appropriate excretion bilaterally. There is a 1.2 centimeter cyst in the LATERAL UPPER pole region of the LEFT kidney. No solid mass, hydronephrosis, or perinephric stranding. The ureters are unremarkable. The bladder and visualized portion of the urethra are normal. Stomach/Bowel: Stomach is unremarkable. There is artifact in the region of the distal stomach and duodenal from vascular coils. Small bowel loops are unremarkable. The appendix is mildly thickened, measuring 10 millimeters on image 45 of series 7. There is no periappendiceal inflammatory  change or fluid. Vascular/Lymphatic: Abdominal aorta is partially calcified. Celiac stent graft. Numerous vascular coils along the MEDIAL wall of the stomach and duodenum. No adenopathy. Reproductive: Uterus is present.  No adnexal mass. Other: No free pelvic fluid. Subcutaneous injection sites in the anterior abdominal wall. Musculoskeletal: No acute or significant osseous findings. IMPRESSION: 1. No evidence for urinary tract obstruction or pyelonephritis. 2. Mildly thickened appendix without periappendiceal inflammatory change or fluid. Findings raise a question for early acute appendicitis. Recommend correlation with targeted physical exam. 3. Stable chronic changes in the lungs. 4. Celiac stent graft.  Vascular coils in the RIGHT UPPER QUADRANT. 5. Aortic Atherosclerosis (ICD10-I70.0). These results will be called to the ordering clinician or representative by the Radiologist Assistant, and communication documented in the PACS or [employer] Electronically Signed   By: ENolon NationsM.D.   On: 12/22/2019 16:45      PFT Results Latest Ref Rng & Units 11/20/2018 03/30/2018  FVC-Pre L 1.16 1.09  FVC-Predicted Pre % 35 32  Pre FEV1/FVC % % 91 93  FEV1-Pre L 1.06 1.01  FEV1-Predicted Pre % 40 38  DLCO uncorrected ml/min/mmHg 7.29 6.85  DLCO UNC% % 37 34  DLCO corrected ml/min/mmHg 7.45 -  DLCO COR %Predicted % 37 -  DLVA Predicted % 93 92    No results found for: NITRICOXIDE      Assessment & Plan:   ILD (interstitial lung disease) (HCC) Progressive NSIP with UIP pattern on CT scan-patient appears to be clinically stable with no increased oxygen demands. She remains on immunosuppression with Imuran, prednisone 10 mg daily.  She is on P JP prophylaxis with Bactrim 3 days a week. Currently Imuran is on hold due to recent severe recurrent UTIs and recent hospitalization with ESBL Proteus We will repeat urine culture.  If patient is clear should be able to resume Imuran.  If patient  continues to have recurrent infections will refer  to urology and also consider ID consult. Patient needs a follow-up spirometry with DLCO on return.   Plan  Patient Instructions  Referral to GYN to establish. Referral to surgery -Dr. Donne Hazel for abnormal appendix.  We will check a urine culture today  Please drink plenty of water.  Urinate frequently and empty your bladder often. Saline nasal spray and gel As needed   Once urine cultures are returned will decide on Imuran restart date .  Continue on Ofev 150 mg twice daily Continue on prednisone 10 mg daily Esbriet as directed.  Continue on oxygen-goal is to keep oxygen levels greater than 88 to 90% Continue with Pulmonary rehab  CT chest (no contrast) in 4 weeks  Work on healthy weight loss.  Follow-up in 4  weeks with Dr. [name] in ILD clinic with Spirometry with DLCO and As needed   Please contact office for sooner follow up if symptoms do not improve or worsen or seek emergency care        Complicated UTI (urinary tract infection) Recurrent urinary tract infections, drug-resistant UTI-ESBL Proteus requiring hospitalization with IV antibiotics.  Patient is clinically improved. Patient is on immunosuppression.  Currently Imuran is on hold.  Urine culture is pending.  If urine culture is clear will consider restarting Imuran. If patient continues to get recurrent infections will need referral to urology.  Plus or minus ID referral. Have also referred patient to establish with GYN as she has had no gynecological surveillance in several years  Plan  Patient Instructions  Referral to GYN to establish. Referral to surgery -Dr. Donne Hazel for abnormal appendix.  We will check a urine culture today  Please drink plenty of water.  Urinate frequently and empty your bladder often. Saline nasal spray and gel As needed   Once urine cultures are returned will decide on Imuran restart date .  Continue on Ofev 150 mg twice  daily Continue on prednisone 10 mg daily Esbriet as directed.  Continue on oxygen-goal is to keep oxygen levels greater than 88 to 90% Continue with Pulmonary rehab  CT chest (no contrast) in 4 weeks  Work on healthy weight loss.  Follow-up in 4  weeks with Dr. [name] in ILD clinic with Spirometry with DLCO and As needed   Please contact office for sooner follow up if symptoms do not improve or worsen or seek emergency care        Chronic respiratory failure with hypoxia ([place]) Chronic respiratory failure with high oxygen requirements.  Patient had no increased oxygen demands.  Continue on current level at 6 L at rest and 8 L with activity and exercise to maintain O2 saturations greater than 88 to 90%.  Abnormal CT of the abdomen Recent hospitalization with recurrent UTI/ESBL Proteus UTI.   CT abdomen and pelvis showed no evidence of abscess or pyelonephritis. There was an incidental finding of mildly thickened appendix without periappendiceal inflammatory change or fluid. Patient was seen by general surgery.  With recommendations for conservative management. Patient is requesting a repeat CT abdomen.  Have advised that we will hold off on this at this time.  Clinically she is improved.  Will refer to general surgery for follow-up visit to evaluate and decide if any additional scans are needed.     Rexene Edison, NP

## 2020-01-03 ENCOUNTER — Encounter (HOSPITAL_COMMUNITY): Admission: RE | Admit: 2020-01-03 | Payer: Self-pay | Source: Ambulatory Visit

## 2020-01-03 ENCOUNTER — Other Ambulatory Visit: Payer: BC Managed Care – PPO

## 2020-01-03 DIAGNOSIS — R935 Abnormal findings on diagnostic imaging of other abdominal regions, including retroperitoneum: Secondary | ICD-10-CM | POA: Insufficient documentation

## 2020-01-03 DIAGNOSIS — N390 Urinary tract infection, site not specified: Secondary | ICD-10-CM

## 2020-01-03 NOTE — Assessment & Plan Note (Signed)
Recent hospitalization with recurrent UTI/ESBL Proteus UTI.   CT abdomen and pelvis showed no evidence of abscess or pyelonephritis. There was an incidental finding of mildly thickened appendix without periappendiceal inflammatory change or fluid. Patient was seen by general surgery.  With recommendations for conservative management. Patient is requesting a repeat CT abdomen.  Have advised that we will hold off on this at this time.  Clinically she is improved.  Will refer to general surgery for follow-up visit to evaluate and decide if any additional scans are needed.

## 2020-01-03 NOTE — Assessment & Plan Note (Signed)
Progressive NSIP with UIP pattern on CT scan-patient appears to be clinically stable with no increased oxygen demands. She remains on immunosuppression with Imuran, prednisone 10 mg daily.  She is on P JP prophylaxis with Bactrim 3 days a week. Currently Imuran is on hold due to recent severe recurrent UTIs and recent hospitalization with ESBL Proteus We will repeat urine culture.  If patient is clear should be able to resume Imuran.  If patient continues to have recurrent infections will refer to urology and also consider ID consult. Patient needs a follow-up spirometry with DLCO on return.   Plan  Patient Instructions  Referral to GYN to establish. Referral to surgery -Dr. Donne Hazel for abnormal appendix.  We will check a urine culture today  Please drink plenty of water.  Urinate frequently and empty your bladder often. Saline nasal spray and gel As needed   Once urine cultures are returned will decide on Imuran restart date .  Continue on Ofev 150 mg twice daily Continue on prednisone 10 mg daily Esbriet as directed.  Continue on oxygen-goal is to keep oxygen levels greater than 88 to 90% Continue with Pulmonary rehab  CT chest (no contrast) in 4 weeks  Work on healthy weight loss.  Follow-up in 4  weeks with Dr. [name] in ILD clinic with Spirometry with DLCO and As needed   Please contact office for sooner follow up if symptoms do not improve or worsen or seek emergency care

## 2020-01-03 NOTE — Assessment & Plan Note (Signed)
Chronic respiratory failure with high oxygen requirements.  Patient had no increased oxygen demands.  Continue on current level at 6 L at rest and 8 L with activity and exercise to maintain O2 saturations greater than 88 to 90%.

## 2020-01-03 NOTE — Assessment & Plan Note (Signed)
Recurrent urinary tract infections, drug-resistant UTI-ESBL Proteus requiring hospitalization with IV antibiotics.  Patient is clinically improved. Patient is on immunosuppression.  Currently Imuran is on hold.  Urine culture is pending.  If urine culture is clear will consider restarting Imuran. If patient continues to get recurrent infections will need referral to urology.  Plus or minus ID referral. Have also referred patient to establish with GYN as she has had no gynecological surveillance in several years  Plan  Patient Instructions  Referral to GYN to establish. Referral to surgery -Dr. Donne Hazel for abnormal appendix.  We will check a urine culture today  Please drink plenty of water.  Urinate frequently and empty your bladder often. Saline nasal spray and gel As needed   Once urine cultures are returned will decide on Imuran restart date .  Continue on Ofev 150 mg twice daily Continue on prednisone 10 mg daily Esbriet as directed.  Continue on oxygen-goal is to keep oxygen levels greater than 88 to 90% Continue with Pulmonary rehab  CT chest (no contrast) in 4 weeks  Work on healthy weight loss.  Follow-up in 4  weeks with Dr. [name] in ILD clinic with Spirometry with DLCO and As needed   Please contact office for sooner follow up if symptoms do not improve or worsen or seek emergency care

## 2020-01-04 ENCOUNTER — Telehealth: Payer: Self-pay | Admitting: Internal Medicine

## 2020-01-04 LAB — URINE CULTURE
MICRO NUMBER:: 11191304
SPECIMEN QUALITY:: ADEQUATE

## 2020-01-04 NOTE — Telephone Encounter (Signed)
PA request received from [place] requested: Ofev 150 CMM Key: [id number] Tried/failed: n/a Covered alternatives: n/a PA request has been approved from 01/03/2020 - 01/02/2021.

## 2020-01-07 ENCOUNTER — Telehealth: Payer: Self-pay | Admitting: Adult Health

## 2020-01-07 NOTE — Telephone Encounter (Signed)
[name] Parrett NP, called and spoke with patient.  Nothing further needed.

## 2020-01-07 NOTE — Telephone Encounter (Signed)
Now that is fine.  The recommendations from the hospital was that she probably did not need any follow-up -just conservative treatment.  She is currently not having any symptoms this date should be fine.  If patient starts to have symptoms can let us know we can try to help her get a sooner date with another provider.

## 2020-01-07 NOTE — Telephone Encounter (Signed)
Spoke to [name] with [name] surgery, who stated that first available with Dr. Donne Hazel isn't until 02/11/2020. Tasha Ballard is questioning if this is too far out and if so can patient see another provider.   [name], please advise. thanks

## 2020-01-07 NOTE — Telephone Encounter (Signed)
[name], please see mychart message sent by pt and advise.

## 2020-01-07 NOTE — Telephone Encounter (Signed)
Spoke to [place] with [facility] and relayed below message.  Lattie Haw stated that she spoke with patient and patient would like to hold off on referral for now, as she is not having any sx. Per Lattie Haw, patient will contact [facility] [place] [facility] for an appointment if she develops sx. Nothing further needed.  Routing to [employer] as an [place].

## 2020-01-08 ENCOUNTER — Telehealth (HOSPITAL_COMMUNITY): Payer: Self-pay | Admitting: *Deleted

## 2020-01-08 ENCOUNTER — Encounter (HOSPITAL_COMMUNITY): Admission: RE | Admit: 2020-01-08 | Payer: Self-pay | Source: Ambulatory Visit

## 2020-01-10 ENCOUNTER — Encounter (HOSPITAL_COMMUNITY): Payer: Self-pay

## 2020-01-11 ENCOUNTER — Other Ambulatory Visit: Payer: Self-pay | Admitting: Internal Medicine

## 2020-01-15 ENCOUNTER — Encounter (HOSPITAL_COMMUNITY): Payer: Self-pay

## 2020-01-22 ENCOUNTER — Encounter (HOSPITAL_COMMUNITY)
Admission: RE | Admit: 2020-01-22 | Discharge: 2020-01-22 | Disposition: A | Discharge status: Home / Self Care | Payer: Self-pay | Source: Ambulatory Visit | Attending: Internal Medicine | Admitting: Internal Medicine

## 2020-01-22 ENCOUNTER — Other Ambulatory Visit: Payer: Self-pay

## 2020-01-24 ENCOUNTER — Ambulatory Visit
Admission: RE | Admit: 2020-01-24 | Discharge: 2020-01-24 | Disposition: A | Discharge status: Home / Self Care | Payer: BC Managed Care – PPO | Source: Ambulatory Visit | Attending: Adult Health | Admitting: Adult Health

## 2020-01-24 ENCOUNTER — Other Ambulatory Visit: Payer: Self-pay

## 2020-01-24 ENCOUNTER — Encounter (HOSPITAL_COMMUNITY)
Admission: RE | Admit: 2020-01-24 | Discharge: 2020-01-24 | Disposition: A | Discharge status: Home / Self Care | Payer: Self-pay | Source: Ambulatory Visit | Attending: Internal Medicine | Admitting: Internal Medicine

## 2020-01-24 DIAGNOSIS — C3432 Malignant neoplasm of lower lobe, left bronchus or lung: Secondary | ICD-10-CM

## 2020-01-24 DIAGNOSIS — J84112 Idiopathic pulmonary fibrosis: Secondary | ICD-10-CM | POA: Insufficient documentation

## 2020-01-25 ENCOUNTER — Encounter: Payer: Self-pay | Admitting: Adult Health

## 2020-01-25 ENCOUNTER — Ambulatory Visit (INDEPENDENT_AMBULATORY_CARE_PROVIDER_SITE_OTHER): Payer: BC Managed Care – PPO | Admitting: Adult Health

## 2020-01-25 ENCOUNTER — Ambulatory Visit (INDEPENDENT_AMBULATORY_CARE_PROVIDER_SITE_OTHER): Payer: BC Managed Care – PPO | Admitting: Internal Medicine

## 2020-01-25 VITALS — BP 124/80 | HR 63 | Temp 97.7°F | Ht 63.0 in | Wt 192.4 lb

## 2020-01-25 DIAGNOSIS — Z79899 Other long term (current) drug therapy: Secondary | ICD-10-CM

## 2020-01-25 DIAGNOSIS — C3432 Malignant neoplasm of lower lobe, left bronchus or lung: Secondary | ICD-10-CM

## 2020-01-25 DIAGNOSIS — J9611 Chronic respiratory failure with hypoxia: Secondary | ICD-10-CM | POA: Diagnosis not present

## 2020-01-25 DIAGNOSIS — Z6841 Body Mass Index (BMI) 40.0 and over, adult: Secondary | ICD-10-CM

## 2020-01-25 DIAGNOSIS — J849 Interstitial pulmonary disease, unspecified: Secondary | ICD-10-CM

## 2020-01-25 DIAGNOSIS — N390 Urinary tract infection, site not specified: Secondary | ICD-10-CM

## 2020-01-25 LAB — CBC WITH DIFFERENTIAL/PLATELET
Basophils Absolute: 0 10*3/uL (ref 0.0–0.1)
Basophils Relative: 0.4 % (ref 0.0–3.0)
Eosinophils Absolute: 0.1 10*3/uL (ref 0.0–0.7)
Eosinophils Relative: 0.8 % (ref 0.0–5.0)
HCT: 39.4 % (ref 36.0–46.0)
Hemoglobin: 12.9 g/dL (ref 12.0–15.0)
Lymphocytes Relative: 18.1 % (ref 12.0–46.0)
Lymphs Abs: 1.7 10*3/uL (ref 0.7–4.0)
MCHC: 32.9 g/dL (ref 30.0–36.0)
MCV: 93 fl (ref 78.0–100.0)
Monocytes Absolute: 1.1 10*3/uL — ABNORMAL HIGH (ref 0.1–1.0)
Monocytes Relative: 11.5 % (ref 3.0–12.0)
Neutro Abs: 6.5 10*3/uL (ref 1.4–7.7)
Neutrophils Relative %: 69.2 % (ref 43.0–77.0)
Platelets: 252 10*3/uL (ref 150.0–400.0)
RBC: 4.23 Mil/uL (ref 3.87–5.11)
RDW: 14.6 % (ref 11.5–15.5)
WBC: 9.4 10*3/uL (ref 4.0–10.5)

## 2020-01-25 LAB — PULMONARY FUNCTION TEST
DL/VA % pred: 150 %
DL/VA: 6.57 ml/min/mmHg/L
DLCO cor % pred: 60 %
DLCO cor: 11.78 ml/min/mmHg
DLCO unc % pred: 27 %
DLCO unc: 5.44 ml/min/mmHg
FEF 25-75 Pre: 1.72 L/sec
FEF2575-%Pred-Pre: 66 %
FEV1-%Pred-Pre: 40 %
FEV1-Pre: 1.06 L
FEV1FVC-%Pred-Pre: 115 %
FEV6-%Pred-Pre: 35 %
FEV6-Pre: 1.14 L
FEV6FVC-%Pred-Pre: 102 %
FVC-%Pred-Pre: 35 %
FVC-Pre: 1.15 L
Pre FEV1/FVC ratio: 92 %
Pre FEV6/FVC Ratio: 100 %

## 2020-01-25 NOTE — Patient Instructions (Addendum)
We will check a urine culture today , if clear will restart Imuran.  Please drink plenty of water.  Urinate frequently and empty your bladder often. Saline nasal spray and gel As needed   Continue on Ofev 150 mg twice daily Continue on prednisone 10 mg daily Esbriet as directed.  Continue on oxygen-goal is to keep oxygen levels greater than 88 to 90% Continue with Pulmonary rehab  Work on healthy weight loss.  Follow-up in 6  weeks with Dr. [name] in ILD clinic with  Please contact office for sooner follow up if symptoms do not improve or worsen or seek emergency care

## 2020-01-25 NOTE — Assessment & Plan Note (Signed)
Patient is on multiple medication.  Lab work today including CBC and c-Met. Also suspect decreased appetite is secondary to Ofev and Esbriet.  Patient education given.  Will need to follow closely.

## 2020-01-25 NOTE — Progress Notes (Signed)
_0  ID: Tasha Ballard, female    DOB: 11-Sep-1970, 49 y.o.   MRN: [id number]  Chief Complaint  Patient presents with  . Follow-up    ILD     Referring provider: [name], MD  HPI: 49 year old female never smoker seen for pulmonary consult October 2019 to establish for interstitial lung disease.  Patient has previously been seen at [facility].  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  She has been placed on CellCept in the past.  In 2016 had a significant clinical decline and was started on oxygen.  She has also been to [facility] and seen Dr. 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  She has had progressive respiratory decline and increased symptom burden since then. She was treated with 1 full year of oral and IV Cytoxan from December 2019 to November 2020.  She was started on Ofev in December 2019.  And she was started on Imuran in January 2021.  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  Started on aspirin in March 2021. She has chronic respiratory failure with a baseline oxygen levels at 6 L at rest and 8 to 10 L with ambulation and exercise. She is on chronic steroids with prednisone 10 mg daily.  New diagnosis of lung cancer 2021-1 cm left lower lobe lung nodule was noted.  Follow-up scan with growth at 1.5 cm in May 2021.  PET scan showed marked hypermetabolic activity with SUV at 7.7.  There was no distant disease.  Patient was felt to be high risk for biopsy due to severe interstitial lung disease and respiratory failure with high oxygen mans.  She was referred to radiation oncology and underwent SBRT with completion August 24, 2019.   TEST/EVENTS :  High-resolution CT chest April 27, 2019-stable UIP changes no progression since September 2020.  New 1 cm  left lower lobe nodule  High-resolution CT chest Jul 19, 2019-interval growth of the left lower lobe nodule 1.5 cm.  UIP changes stable  CT chest October 02, 2019 stable UIP pattern.  Slight interval decrease in the left lung base nodule.  Consistent with treatment response.  01/25/2020 Follow up ; ILD , O2 RF, Lung cancer  Patient presents for a 1 month follow-up.  Last visit she was seen for a post hospital follow-up.  She was admitted in early November for recurrent urinary tract infection with failure of outpatient antibiotics.  Urine culture showed an ESBL Proteus mirabilis.  She required IV antibiotics.  CT was negative for pyelonephritis or urinary tract obstruction. Patient says she is doing better.  Has noticed a few episodes of urinary urgency but nothing like before.  She does have a follow-up visit with GYN coming up in the next week or 2.  Patient has underlying interstitial lung disease with progressive NSIP.  She is on an aggressive maintenance regimen with Imuran, Esbriet, Ofev, chronic steroids with prednisone 10 mg daily.  She is currently off of Imuran due to the above recurrent UTIs. Last visit urinary culture was negative.  She will have a repeat urinary culture today.  We discussed if this is negative we will restart Imuran. Patient had CT chest done today to follow her ILD and presumed left lower lobe lung cancer.  March 2021 patient was noted to have  a 1 cm left lower lobe lung nodule.  This grew on follow-up CT chest in May 2020 21 to 1.5 cm.  Patient was felt to be high risk for biopsy due to severe lung disease.  She was sent to radiation oncology.  And underwent SBRT with completion in July 2021. Patient had follow-up CT chest done January 24, 2020 that showed underlying changes of fibrotic lung disease.  Felt to be relatively stable.  Previous left lower lobe lung nodule was unable to be visualized with increasing consolidative opacity along the left lung base.  Scans were  reviewed with Dr. [name].  Felt this could be consistent with radiation changes. There was a 7 mm left upper lobe lung nodule unchanged. Incidental finding showed Port-A-Cath with right IJ looped on itself on the right IJ. We discussed this finding and have decided to have her Port-A-Cath removed as she has no systemic medications indicated at this time.  She says overall breathing is doing about the same since last visit.  She definitely is weaker since her hospitalization with less energy and more shortness of breath with activity and decreased activity tolerance.  She has restarted pulmonary rehab back this week.  She denies any increased cough or congestion.  No increased oxygen demands.  But does feel like that her activity tolerance is lower. She does remain on Ofev and Esbriet however has been told by her insurance they are not going to cover both medications. She also has noticed that her appetite has been lower.  We discussed some helpful hints to help with this.  However she is trying to lose weight as she is trying to obtain her goal weight to be considered for lung transplant. Weight is down 6 pounds. She denies any vomiting or increased diarrhea.  PFT done today showed FEV1 at 40%, ratio 92, FVC 35%.  This is similar to previous PFT 1 year ago.  However her DLCO has decreased from 35% to 27%.  Allergies  Allergen Reactions  . Macrobid [Nitrofurantoin Macrocrystal] Nausea And Vomiting  . Adhesive [Tape] Rash    Immunization History  Administered Date(s) Administered  . Fluad Quad(high Dose 65+) 12/28/2019  . Influenza,inj,Quad PF,6+ Mos 12/24/2014, 02/05/2016, 01/07/2017, 11/22/2017, 10/20/2018  . Influenza-Unspecified 12/24/2014, 02/05/2016, 01/07/2017, 11/22/2017  . PFIZER SARS-COV-2 Vaccination 04/06/2019, 04/30/2019, 10/18/2019  . Pneumococcal Conjugate-13 07/20/2017  . Pneumococcal Polysaccharide-23 06/09/2012, 01/03/2019  . Tdap 02/19/2011    Past Medical History:   Diagnosis Date  . Aneurysm ([place])   . Back pain   . Cancer ([place])   . Diabetes mellitus without complication ([place])   . H/O blood clots   . Hypertension   . Joint pain   . Pulmonary fibrosis ([place])   . SOB (shortness of breath)   . Swelling   . Thyroid disease   . Vitamin D deficiency     Tobacco History: Social History   Tobacco Use  Smoking Status Never Smoker  Smokeless Tobacco Never Used   Counseling given: Not Answered   Outpatient Medications Prior to Visit  Medication Sig Dispense Refill  . benzonatate (TESSALON) 200 MG capsule Take 1 capsule (200 mg total) by mouth 3 (three) times daily as needed for cough. 30 capsule 1  . carvedilol (COREG) 6.25 MG tablet Take 6.25 mg by mouth 2 (two) times daily with a meal.    . ESBRIET 801 MG TABS TAKE 1 TABLET BY MOUTH 3 (THREE) TIMES DAILY WITH MEALS. 90 tablet 11  . famotidine (PEPCID) 40 MG  tablet TAKE 1 TABLET BY MOUTH EVERY DAY (Patient taking differently: Take 40 mg by mouth daily. ) 90 tablet 3  . folic acid (FOLVITE) 1 MG tablet Take 1 tablet (1 mg total) by mouth daily. 30 tablet 0  . furosemide (LASIX) 40 MG tablet Take 40 mg by mouth See admin instructions. Take 1 tablet (40 mg) daily, may repeat dose if needed for fluid retention.    . gabapentin (NEURONTIN) 100 MG capsule Take 1 capsule by mouth daily as needed (For pain).     . metFORMIN (GLUCOPHAGE) 500 MG tablet Take 500 mg by mouth 2 (two) times daily.     . montelukast (SINGULAIR) 10 MG tablet TAKE 1 TABLET BY MOUTH EVERY DAY AFTER SUPPER 90 tablet 1  . OFEV 150 MG CAPS TAKE 1 CAPSULE TWICE A DAY (12 HOURS APART) WITH FOOD (Patient taking differently: Take 150 mg by mouth in the morning and at bedtime. ) 60 capsule 11  . ondansetron (ZOFRAN) 8 MG tablet TAKE 1 TABLET BY MOUTH EVERY 8 HOURS AS NEEDED FOR NAUSEA AND VOMITING (Patient taking differently: Take 8 mg by mouth every 8 (eight) hours as needed for nausea or vomiting. ) 45 tablet 2  . potassium chloride  (KLOR-CON) 10 MEQ tablet Take 3 tablets (30 mEq total) by mouth daily. 90 tablet 2  . predniSONE (DELTASONE) 10 MG tablet Take 1 tablet (10 mg total) by mouth daily.    Marland Kitchen PROAIR HFA 108 (90 Base) MCG/ACT inhaler Inhale 2 puffs into the lungs every 6 (six) hours as needed for wheezing or shortness of breath. 18 g 5  . saccharomyces boulardii (FLORASTOR) 250 MG capsule Take 1 capsule (250 mg total) by mouth 2 (two) times daily. 60 capsule 5  . spironolactone (ALDACTONE) 100 MG tablet Take 100 mg by mouth daily.    Marland Kitchen sulfamethoxazole-trimethoprim (BACTRIM) 400-80 MG tablet Take 1 tablet by mouth every Monday, Wednesday, and Friday.    . thyroid (ARMOUR) 90 MG tablet Take 90 mg by mouth daily before breakfast.     . Vitamin D, Ergocalciferol, (DRISDOL) 1.25 MG (50000 UT) CAPS capsule TAKE 1 CAPSULE BY MOUTH ONCE WEEKLY (Patient taking differently: Take 50,000 Units by mouth every 7 (seven) days. ) 3 capsule 0  . azathioprine (IMURAN) 100 MG tablet Take 1 tablet (100 mg total) by mouth daily. (Patient not taking: Reported on 01/25/2020) 90 tablet 3   No facility-administered medications prior to visit.     Review of Systems:   Constitutional:   No  weight loss, night sweats,  Fevers, chills, + fatigue, or  lassitude.  HEENT:   No headaches,  Difficulty swallowing,  Tooth/dental problems, or  Sore throat,                No sneezing, itching, ear ache, nasal congestion, post nasal drip,   CV:  No chest pain,  Orthopnea, PND, swelling in lower extremities, anasarca, dizziness, palpitations, syncope.   GI  No heartburn, indigestion, abdominal pain, nausea, vomiting, diarrhea,   bloody stools.   Resp:    No chest wall deformity  Skin: no rash or lesions.  GU: no dysuria, change in color of urine, no urgency or frequency.  No flank pain, no hematuria   MS:  No joint pain or swelling.  No decreased range of motion.  No back pain.    Physical Exam  BP 124/80 (BP Location: Left Arm, Patient  Position: Sitting, Cuff Size: Large)   Pulse 63  Temp 97.7 F (36.5 C) (Temporal)   Ht _0  (1.6 m)   Wt 192 lb 6.4 oz (87.3 kg)   BMI 34.08 kg/m   GEN: A/Ox3; pleasant , NAD, chronically ill-appearing, on oxygen   HEENT:  [place]/AT,   NOSE-clear, THROAT-clear, no lesions, no postnasal drip or exudate noted.   NECK:  Supple w/ fair ROM; no JVD; normal carotid impulses w/o bruits; no thyromegaly or nodules palpated; no lymphadenopathy.    RESP bibasilar crackles no accessory muscle use, no dullness to percussion  CARD:  RRR, no m/r/g, no peripheral edema, pulses intact, no cyanosis or clubbing.  GI:   Soft & nt; nml bowel sounds; no organomegaly or masses detected.   Musco: Warm bil, no deformities or joint swelling noted.   Neuro: alert, no focal deficits noted.    Skin: Warm, no lesions or rashes    Lab Results:  CBC   BNP No results found for: BNP   Imaging: CT Chest Wo Contrast  Result Date: 01/24/2020 CLINICAL DATA:  Interstitial lung disease.  Lung cancer. EXAM: CT CHEST WITHOUT CONTRAST TECHNIQUE: Multidetector CT imaging of the chest was performed following the standard protocol without IV contrast. COMPARISON:  10/01/2019 FINDINGS: Cardiovascular: The heart size is normal. No substantial pericardial effusion. Coronary artery calcification is evident. Atherosclerotic calcification is noted in the wall of the thoracic aorta. Right IJ central line has become looped on itself in the internal jugular vein since prior study although the tip is still directed distally and positioned at the innominate vein confluence. Mediastinum/Nodes: No mediastinal lymphadenopathy. No evidence for gross hilar lymphadenopathy although assessment is limited by the lack of intravenous contrast on today's study. The esophagus has normal imaging features. Lungs/Pleura: Underlying changes of fibrotic lung disease again noted. Appearance is relatively stable except in the posteromedial left base  where ground-glass attenuation seen previously is now more confluent/consolidative. The 1.2 x 1.0 cm nodule in the medial left base measured on the prior study is no longer discretely visible likely due to incorporation into the increasing consolidative opacity in the posterior left lung base. 7 mm left upper lobe nodule on 36/5 is unchanged. Right upper lobe staple line again noted. Nodular opacity in the medial left base Upper Abdomen: Vascular embolization coils noted mid abdomen with incomplete visualization of SMA stent device. Musculoskeletal: No worrisome lytic or sclerotic osseous abnormality. IMPRESSION: 1. Interval progression of consolidative opacity in the posteromedial left lung base, now incorporating the nodular opacity seen on the prior study. Imaging features are nonspecific, but infectious/inflammatory etiology could have this appearance. 2. Otherwise similar appearance of background fibrotic lung disease. 3. 7 mm left upper lobe pulmonary nodule is stable. 4. Right IJ central line has become looped on itself in the right internal jugular vein since prior study although the tip is still directed distally and positioned at the innominate vein confluence. Correlation with central line function suggested. 5. Aortic Atherosclerosis (ICD10-I70.0). Electronically Signed   By: Misty Stanley M.D.   On: 01/24/2020 13:39      PFT Results Latest Ref Rng & Units 01/25/2020 11/20/2018 03/30/2018  FVC-Pre L 1.15 1.16 1.09  FVC-Predicted Pre % 35 35 32  Pre FEV1/FVC % % 92 91 93  FEV1-Pre L 1.06 1.06 1.01  FEV1-Predicted Pre % 40 40 38  DLCO uncorrected ml/min/mmHg 5.44 7.29 6.85  DLCO UNC% % 27 37 34  DLCO corrected ml/min/mmHg 11.78 7.45 -  DLCO COR %Predicted % 60 37 -  DLVA Predicted % 150  93 92    No results found for: NITRICOXIDE      Assessment & Plan:   ILD (interstitial lung disease) ([place]) Progressive NSIP.  Patient remains on an aggressive regimen with Imuran, Ofev, Esbriet,  chronic steroids. Imuran has been on hold over the last few weeks due to recurrent UTIs -drug-resistant. She is clinically improved.  We will repeat urine culture and if negative today we will restart Imuran. Have sent message to pharmacy to help with the appeal for Esbriet and Ofev. Continue on prednisone at 10 mg daily. PFT today shows somewhat stable lung function however her diffusing capacity has decreased-not necessarily surprised with this with previous radiation therapy earlier this year.  Plan  Patient Instructions  We will check a urine culture today , if clear will restart Imuran.  Please drink plenty of water.  Urinate frequently and empty your bladder often. Saline nasal spray and gel As needed   Continue on Ofev 150 mg twice daily Continue on prednisone 10 mg daily Esbriet as directed.  Continue on oxygen-goal is to keep oxygen levels greater than 88 to 90% Continue with Pulmonary rehab  Work on healthy weight loss.  Follow-up in 6  weeks with Dr. [name] in ILD clinic with  Please contact office for sooner follow up if symptoms do not improve or worsen or seek emergency care       Chronic respiratory failure with hypoxia ([place]) No increased oxygen demands.  Continue on current levels to keep O2 saturations greater than 88 to 90%.  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  Patient underwent SBRT with completion July 2021. Serial follow-up CT chest reviewed with Dr. [name] with increased consolidative changes along the left lower lobe.-Possibly secondary to radiation changes.  Stable left upper lobe 7 mm nodule.  We will follow-up CT in 6 months.   Complicated UTI (urinary tract infection) Recurrent UTIs-with recent hospitalization due to drug-resistant UTI. Patient is clinically improved.  She has follow-up with GYN in 1 to 2 weeks. May need urology referral going forward. We  will recheck urinary culture today.  If negative.  As above we will restart her Imuran for her progressive NSIP ILD.   Morbid obesity with BMI of 40.0-44.9, adult ([place]) Continue with healthy weight loss  Medication management Patient is on multiple medication.  Lab work today including CBC and c-Met. Also suspect decreased appetite is secondary to Ofev and Esbriet.  Patient education given.  Will need to follow closely.     Rexene Edison, NP 01/25/2020

## 2020-01-25 NOTE — Assessment & Plan Note (Signed)
Recurrent UTIs-with recent hospitalization due to drug-resistant UTI. Patient is clinically improved.  She has follow-up with GYN in 1 to 2 weeks. May need urology referral going forward. We will recheck urinary culture today.  If negative.  As above we will restart her Imuran for her progressive NSIP ILD.

## 2020-01-25 NOTE — Assessment & Plan Note (Signed)
Continue with healthy weight loss

## 2020-01-25 NOTE — Assessment & Plan Note (Signed)
No increased oxygen demands.  Continue on current levels to keep O2 saturations greater than 88 to 90%.

## 2020-01-25 NOTE — Assessment & Plan Note (Signed)
Left lower lobe lung tumor-presumed lung cancer with growth on CT scan.  Patient underwent SBRT with completion July 2021. Serial follow-up CT chest reviewed with Dr. [name] with increased consolidative changes along the left lower lobe.-Possibly secondary to radiation changes.  Stable left upper lobe 7 mm nodule.  We will follow-up CT in 6 months.

## 2020-01-25 NOTE — Assessment & Plan Note (Signed)
Progressive NSIP.  Patient remains on an aggressive regimen with Imuran, Ofev, Esbriet, chronic steroids. Imuran has been on hold over the last few weeks due to recurrent UTIs -drug-resistant. She is clinically improved.  We will repeat urine culture and if negative today we will restart Imuran. Have sent message to pharmacy to help with the appeal for Esbriet and Ofev. Continue on prednisone at 10 mg daily. PFT today shows somewhat stable lung function however her diffusing capacity has decreased-not necessarily surprised with this with previous radiation therapy earlier this year.  Plan  Patient Instructions  We will check a urine culture today , if clear will restart Imuran.  Please drink plenty of water.  Urinate frequently and empty your bladder often. Saline nasal spray and gel As needed   Continue on Ofev 150 mg twice daily Continue on prednisone 10 mg daily Esbriet as directed.  Continue on oxygen-goal is to keep oxygen levels greater than 88 to 90% Continue with Pulmonary rehab  Work on healthy weight loss.  Follow-up in 6  weeks with Dr. [name] in ILD clinic with  Please contact office for sooner follow up if symptoms do not improve or worsen or seek emergency care

## 2020-01-25 NOTE — Addendum Note (Signed)
Addended by: Suzzanne Cloud E on: 01/25/2020 10:48 AM   Modules accepted: Orders

## 2020-01-25 NOTE — Progress Notes (Signed)
Spiro/ DLCO completed today

## 2020-01-29 ENCOUNTER — Encounter (HOSPITAL_COMMUNITY): Payer: Self-pay

## 2020-01-29 DIAGNOSIS — N390 Urinary tract infection, site not specified: Secondary | ICD-10-CM

## 2020-01-29 LAB — URINE CULTURE
MICRO NUMBER:: 11274397
SPECIMEN QUALITY:: ADEQUATE

## 2020-01-29 MED ORDER — AMOXICILLIN-POT CLAVULANATE 875-125 MG PO TABS: 1.0000 | ORAL_TABLET | Freq: Two times a day (BID) | ORAL | 0 refills | Status: AC

## 2020-01-29 MED ORDER — PHENAZOPYRIDINE HCL 200 MG PO TABS: 200.0000 mg | ORAL_TABLET | Freq: Three times a day (TID) | ORAL | 1 refills | Status: DC | PRN

## 2020-01-29 NOTE — Telephone Encounter (Signed)
Begin Augmentin 823m Twice daily  For 10 days- rx sent   Hold Imuran for now   Pyridium As needed - rx sent    Push fluids   Please contact office for sooner follow up if symptoms do not improve or worsen or seek emergency care    Follow up with GYN next week as planned and As needed    1. Refer to urology for recurrent UTI /Resistant UTI, immunosuppressed patient -high risk, recent hospitalization  -ASAP referral   2. [facility] . Please see that this is set up .

## 2020-01-29 NOTE — Telephone Encounter (Signed)
[name], please see mychart message sent by pt and advise.

## 2020-01-29 NOTE — Progress Notes (Signed)
[name], do I need to put a referral in for general surgery to remove the port or do you know if IR can do that?  Please advise.

## 2020-01-30 ENCOUNTER — Telehealth (HOSPITAL_COMMUNITY): Payer: Self-pay | Admitting: Family Medicine

## 2020-01-30 ENCOUNTER — Other Ambulatory Visit: Payer: Self-pay | Admitting: *Deleted

## 2020-01-30 MED FILL — ESBRIET 801 MG TABS: 801 | 30 days supply | Qty: 90 | Fill #6

## 2020-01-30 NOTE — Progress Notes (Signed)
Tasha Ballard has spoken with patient.  Referral to urology already placed.  Nothing further needed.

## 2020-01-30 NOTE — Telephone Encounter (Signed)
Pt is calling to inform [place] that the UTI had gotten worse. While taking the prescribed medication makes her throw up and abd pain . She is needing a call back . Please advise

## 2020-01-30 NOTE — Telephone Encounter (Signed)
Contacted patient-patient aware if unable to eat or drink develops abdominal pain back pain she is to go to the emergency room. For now we will hold Esbriet and Ofev.  May use Zofran as needed for nausea advance a bland diet as tolerated We will follow-up next week.  She is to continue to follow-up with GYN and upcoming urology.  Spoke with patient and she is aware  Please put in a urine and urine culture for her to have repeated next week.  Also please find a spot on my schedule for next week and I will see her in the office towards the end of the week  Please contact office for sooner follow up if symptoms do not improve or worsen or seek emergency care

## 2020-01-30 NOTE — Telephone Encounter (Signed)
[name] please advise on patient mychart message  Please inform Parrett that the UTI had gotten worse. While taking the prescribed medication makes her throw up and abd pain . She is needing a call back . Please advise

## 2020-01-31 ENCOUNTER — Encounter (HOSPITAL_COMMUNITY): Payer: Self-pay

## 2020-02-04 ENCOUNTER — Other Ambulatory Visit (INDEPENDENT_AMBULATORY_CARE_PROVIDER_SITE_OTHER): Payer: BC Managed Care – PPO

## 2020-02-04 DIAGNOSIS — N390 Urinary tract infection, site not specified: Secondary | ICD-10-CM | POA: Diagnosis not present

## 2020-02-04 LAB — URINALYSIS, MICROSCOPIC ONLY: RBC / HPF: NONE SEEN (ref 0–?)

## 2020-02-05 ENCOUNTER — Other Ambulatory Visit: Payer: Self-pay

## 2020-02-05 ENCOUNTER — Encounter (HOSPITAL_COMMUNITY): Admission: RE | Admit: 2020-02-05 | Payer: Self-pay | Source: Ambulatory Visit

## 2020-02-05 ENCOUNTER — Ambulatory Visit (INDEPENDENT_AMBULATORY_CARE_PROVIDER_SITE_OTHER): Payer: BC Managed Care – PPO | Admitting: Nurse Practitioner

## 2020-02-05 ENCOUNTER — Encounter: Payer: Self-pay | Admitting: Nurse Practitioner

## 2020-02-05 VITALS — BP 134/80 | Ht 63.0 in | Wt 193.0 lb

## 2020-02-05 DIAGNOSIS — N952 Postmenopausal atrophic vaginitis: Secondary | ICD-10-CM

## 2020-02-05 DIAGNOSIS — Z01419 Encounter for gynecological examination (general) (routine) without abnormal findings: Secondary | ICD-10-CM | POA: Diagnosis not present

## 2020-02-05 DIAGNOSIS — B373 Candidiasis of vulva and vagina: Secondary | ICD-10-CM

## 2020-02-05 DIAGNOSIS — N949 Unspecified condition associated with female genital organs and menstrual cycle: Secondary | ICD-10-CM | POA: Diagnosis not present

## 2020-02-05 DIAGNOSIS — N9489 Other specified conditions associated with female genital organs and menstrual cycle: Secondary | ICD-10-CM

## 2020-02-05 DIAGNOSIS — B3731 Acute candidiasis of vulva and vagina: Secondary | ICD-10-CM

## 2020-02-05 DIAGNOSIS — R300 Dysuria: Secondary | ICD-10-CM

## 2020-02-05 LAB — URINE CULTURE
MICRO NUMBER:: 11309528
Result:: NO GROWTH
SPECIMEN QUALITY:: ADEQUATE

## 2020-02-05 LAB — WET PREP FOR TRICH, YEAST, CLUE

## 2020-02-05 MED ORDER — PHENAZOPYRIDINE HCL 200 MG PO TABS: 200.0000 mg | ORAL_TABLET | Freq: Three times a day (TID) | ORAL | 1 refills | Status: DC | PRN

## 2020-02-05 MED ORDER — FOLIC ACID 1 MG PO TABS
1.0000 mg | ORAL_TABLET | Freq: Every day | ORAL | 5 refills | Status: DC
Start: 2020-02-05 — End: 2020-08-13

## 2020-02-05 MED ORDER — FLUCONAZOLE 150 MG PO TABS: 150.0000 mg | ORAL_TABLET | Freq: Once | ORAL | 0 refills | Status: AC

## 2020-02-05 NOTE — Telephone Encounter (Signed)
You sent just one pill but your office note indicates different instructions.

## 2020-02-05 NOTE — Telephone Encounter (Signed)
Tasha Ballard, She is asking about the prescription for folic acid, please advise if patient needs to continue.  She is asking why she needs this.  Please advise.

## 2020-02-05 NOTE — Patient Instructions (Addendum)
Replens 2-3 times per week  Health Maintenance, Female Adopting a healthy lifestyle and getting preventive care are important in promoting health and wellness. Ask your health care provider about:  The right schedule for you to have regular tests and exams.  Things you can do on your own to prevent diseases and keep yourself healthy. What should I know about diet, weight, and exercise? Eat a healthy diet   Eat a diet that includes plenty of vegetables, fruits, low-fat dairy products, and lean protein.  Do not eat a lot of foods that are high in solid fats, added sugars, or sodium. Maintain a healthy weight Body mass index (BMI) is used to identify weight problems. It estimates body fat based on height and weight. Your health care provider can help determine your BMI and help you achieve or maintain a healthy weight. Get regular exercise Get regular exercise. This is one of the most important things you can do for your health. Most adults should:  Exercise for at least 150 minutes each week. The exercise should increase your heart rate and make you sweat (moderate-intensity exercise).  Do strengthening exercises at least twice a week. This is in addition to the moderate-intensity exercise.  Spend less time sitting. Even light physical activity can be beneficial. Watch cholesterol and blood lipids Have your blood tested for lipids and cholesterol at 51 years of age, then have this test every 5 years. Have your cholesterol levels checked more often if:  Your lipid or cholesterol levels are high.  You are older than 51 years of age.  You are at high risk for heart disease. What should I know about cancer screening? Depending on your health history and family history, you may need to have cancer screening at various ages. This may include screening for:  Breast cancer.  Cervical cancer.  Colorectal cancer.  Skin cancer.  Lung cancer. What should I know about heart disease,  diabetes, and high blood pressure? Blood pressure and heart disease  High blood pressure causes heart disease and increases the risk of stroke. This is more likely to develop in people who have high blood pressure readings, are of African descent, or are overweight.  Have your blood pressure checked: ? Every 3-5 years if you are 51-51 years of age. ? Every year if you are 35 years old or older. Diabetes Have regular diabetes screenings. This checks your fasting blood sugar level. Have the screening done:  Once every three years after age 51 if you are at a normal weight and have a low risk for diabetes.  More often and at a younger age if you are overweight or have a high risk for diabetes. What should I know about preventing infection? Hepatitis B If you have a higher risk for hepatitis B, you should be screened for this virus. Talk with your health care provider to find out if you are at risk for hepatitis B infection. Hepatitis C Testing is recommended for:  Everyone born from [birth year] through 1967.  Anyone with known risk factors for hepatitis C. Sexually transmitted infections (STIs)  Get screened for STIs, including gonorrhea and chlamydia, if: ? You are sexually active and are younger than 51 years of age. ? You are older than 51 years of age and your health care provider tells you that you are at risk for this type of infection. ? Your sexual activity has changed since you were last screened, and you are at increased risk for chlamydia or gonorrhea.  Ask your health care provider if you are at risk.  Ask your health care provider about whether you are at high risk for HIV. Your health care provider may recommend a prescription medicine to help prevent HIV infection. If you choose to take medicine to prevent HIV, you should first get tested for HIV. You should then be tested every 3 months for as long as you are taking the medicine. Pregnancy  If you are about to stop having your  period (premenopausal) and you may become pregnant, seek counseling before you get pregnant.  Take 400 to 800 micrograms (mcg) of folic acid every day if you become pregnant.  Ask for birth control (contraception) if you want to prevent pregnancy. Osteoporosis and menopause Osteoporosis is a disease in which the bones lose minerals and strength with aging. This can result in bone fractures. If you are 22 years old or older, or if you are at risk for osteoporosis and fractures, ask your health care provider if you should:  Be screened for bone loss.  Take a calcium or vitamin D supplement to lower your risk of fractures.  Be given hormone replacement therapy (HRT) to treat symptoms of menopause. Follow these instructions at home: Lifestyle  Do not use any products that contain nicotine or tobacco, such as cigarettes, e-cigarettes, and chewing tobacco. If you need help quitting, ask your health care provider.  Do not use street drugs.  Do not share needles.  Ask your health care provider for help if you need support or information about quitting drugs. Alcohol use  Do not drink alcohol if: ? Your health care provider tells you not to drink. ? You are pregnant, may be pregnant, or are planning to become pregnant.  If you drink alcohol: ? Limit how much you use to 0-1 drink a day. ? Limit intake if you are breastfeeding.  Be aware of how much alcohol is in your drink. In the [place] one drink equals one 12 oz bottle of beer (355 mL), one 5 oz glass of wine (148 mL), or one 1 oz glass of hard liquor (44 mL). General instructions  Schedule regular health, dental, and eye exams.  Stay current with your vaccines.  Tell your health care provider if: ? You often feel depressed. ? You have ever been abused or do not feel safe at home. Summary  Adopting a healthy lifestyle and getting preventive care are important in promoting health and wellness.  Follow your health care provider's  instructions about healthy diet, exercising, and getting tested or screened for diseases.  Follow your health care provider's instructions on monitoring your cholesterol and blood pressure. This information is not intended to replace advice given to you by your health care provider. Make sure you discuss any questions you have with your health care provider. Document Revised: 02/01/2018 Document Reviewed: 02/01/2018 Elsevier Patient Education  2020 [employer].

## 2020-02-05 NOTE — Progress Notes (Signed)
Tasha Ballard 11-Sep-1970 [id number]   History:  49 y.o. G1P0010 presents as new patient to establish care. Amenorrheic/endometrial ablation. History of lung cancer, pulmonary fibrosis on continuous O2, blood clots, hypothyroidism. Normal pap and mammogram history. Has had recurrent UTIs for 4-6 months, taking Bactrim prophylactically. UA and culture obtained at PCP office yesterday. Taking Pyridium for dysuria. PCP discussed urology referral. She also has vaginal dryness/dysparuenia. She was on Premarin in the past with good relief but was taken off due to history.   Gynecologic History No LMP recorded. Patient has had an ablation.   Contraception: post menopausal status Last Pap: 9 years ago. Results were: normal Last mammogram: 06/06/2019. Results were: normal Last colonoscopy: Never  Past medical history, past surgical history, family history and social history were all reviewed and documented in the EPIC chart.  ROS:  A ROS was performed and pertinent positives and negatives are included.  Exam:  Vitals:   02/05/20 1120  BP: 134/80  Weight: 193 lb (87.5 kg)  Height: 5' 3" (1.6 m)   Body mass index is 34.19 kg/m.  General appearance:  Normal Thyroid:  Symmetrical, normal in size, without palpable masses or nodularity. Respiratory  Auscultation:  Clear without wheezing or rhonchi Cardiovascular  Auscultation:  Regular rate, without rubs, murmurs or gallops  Edema/varicosities:  Not grossly evident Abdominal  Soft,nontender, without masses, guarding or rebound.  Liver/spleen:  No organomegaly noted  Hernia:  None appreciated  Skin  Inspection:  Grossly normal   Breasts: Examined lying and sitting.   Right: Without masses, retractions, discharge or axillary adenopathy.   Left: Without masses, retractions, discharge or axillary adenopathy. Gentitourinary   Inguinal/mons:  Normal without inguinal adenopathy  External genitalia:  Normal  BUS/Urethra/Skene's glands:   Normal  Vagina:  Atrophic changes, mild erythema. Wet prep obtained  Cervix:  Normal, pap done  Uterus:  Normal in size, shape and contour.  Midline and mobile  Adnexa/parametria:     Rt: Without masses or tenderness.   Lt: Without masses or tenderness.  Anus and perineum: Normal  Digital rectal exam: Normal sphincter tone without palpated masses or tenderness  Wet prep + yeast  Assessment/Plan:  49 y.o. G1P0010 as new patient.  Well female exam with routine gynecological exam -Education provided on SBEs, importance of preventative screenings, current guidelines, high calcium diet, regular exercise, and multivitamin daily. Labs with PCP.   Dysuria - Plan: phenazopyridine (PYRIDIUM) 200 MG tablet as needed for dysuria. PCP ordered urine culture yesterday and plans to send referral to urology if needed. Taking Bactrim as prophylaxis. Recurrent UTIs for 4-6 months.   Vaginal burning - Plan: WET PREP FOR TRICH, YEAST, CLUE  Vaginal candidiasis - Plan: fluconazole (DIFLUCAN) 150 MG tablet today and repeat in 3 days for total of 2 doses.  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 Recommend OTC Replens 2-3 times per week.   Screening for cervical cancer - Normal pap history. Pap with high-risk HPV today.  Screening for breast cancer -normal mammogram history. Continue annual screenings. Normal breast exam today.  Screening for colon cancer -has not had screening colonoscopy and was told she may be high risk for sedation due to lung disease. Recommend Cologuard.  Follow-up in 1 year for annual.       Tamela Gammon [facility], 11:58 AM 02/05/2020

## 2020-02-06 LAB — PAP IG W/ RFLX HPV ASCU

## 2020-02-07 ENCOUNTER — Other Ambulatory Visit: Payer: Self-pay

## 2020-02-07 ENCOUNTER — Ambulatory Visit: Payer: BC Managed Care – PPO | Admitting: Adult Health

## 2020-02-07 ENCOUNTER — Encounter (HOSPITAL_COMMUNITY)
Admission: RE | Admit: 2020-02-07 | Discharge: 2020-02-07 | Disposition: A | Discharge status: Home / Self Care | Payer: Self-pay | Source: Ambulatory Visit | Attending: Internal Medicine | Admitting: Internal Medicine

## 2020-02-07 ENCOUNTER — Telehealth: Payer: Self-pay | Admitting: Adult Health

## 2020-02-07 NOTE — Telephone Encounter (Signed)
Appointment made for January   Patient is improving . Urine cx is neg.  Patient finishes abx today .   Advised may restart imuran and bactrim on 02/11/20 .   Restart OFEV 02/11/20 - 1 tab daily for 1 week then 1 tab Twice daily    If stomach is tolerating may restart Esbriet 02/25/19 1 tab daily for 1 week then 1 tab Twice daily  On week 2 and week 3 1 tab Three times a day  .   Follow up on 03/03/20 as planned   Nurse to do :  Can we see if referral made to remove porta cath - interventional radiology   Can we check on urology referral .

## 2020-02-11 ENCOUNTER — Telehealth: Payer: Self-pay | Admitting: Adult Health

## 2020-02-11 ENCOUNTER — Other Ambulatory Visit: Payer: Self-pay | Admitting: *Deleted

## 2020-02-11 ENCOUNTER — Other Ambulatory Visit (HOSPITAL_COMMUNITY): Payer: Self-pay | Admitting: Adult Health

## 2020-02-11 DIAGNOSIS — C3490 Malignant neoplasm of unspecified part of unspecified bronchus or lung: Secondary | ICD-10-CM

## 2020-02-11 DIAGNOSIS — C3432 Malignant neoplasm of lower lobe, left bronchus or lung: Secondary | ICD-10-CM

## 2020-02-11 NOTE — Telephone Encounter (Signed)
Tasha Ballard, Order placed for interventional radiology for port-a-cath removal.  The patient is asking that another one be placed.  Please advise.  Thank you.

## 2020-02-11 NOTE — Progress Notes (Signed)
Referral placed for interventional radiology for port-a-cath removal.  Patient asking for port to be replaced at the time of removal.  Discussed with TP and she advised that this cannot be done.  She states she spoke with someone over the radiation oncology department and she was advised that given her medication conditions and that the port-a-cath was under warranty, she should go ahead and have it changed out when the old one is removed, especially given she had to receive IV antibiotics with her recent infection.  Advised I would make [name] aware.  Spoke with [name] and she would like Dr. [name] to follow up on replacing port-a-cath.  Dr. [name], please advise.  Thank you.

## 2020-02-12 ENCOUNTER — Encounter (HOSPITAL_COMMUNITY)
Admission: RE | Admit: 2020-02-12 | Discharge: 2020-02-12 | Disposition: A | Discharge status: Home / Self Care | Payer: Self-pay | Source: Ambulatory Visit | Attending: Internal Medicine | Admitting: Internal Medicine

## 2020-02-12 ENCOUNTER — Other Ambulatory Visit: Payer: Self-pay

## 2020-02-14 ENCOUNTER — Other Ambulatory Visit: Payer: Self-pay

## 2020-02-14 ENCOUNTER — Ambulatory Visit (HOSPITAL_COMMUNITY): Payer: BC Managed Care – PPO

## 2020-02-14 ENCOUNTER — Encounter (HOSPITAL_COMMUNITY): Payer: Self-pay

## 2020-02-14 ENCOUNTER — Encounter (HOSPITAL_COMMUNITY)
Admission: RE | Admit: 2020-02-14 | Discharge: 2020-02-14 | Disposition: A | Discharge status: Home / Self Care | Payer: Self-pay | Source: Ambulatory Visit | Attending: Internal Medicine | Admitting: Internal Medicine

## 2020-02-18 ENCOUNTER — Other Ambulatory Visit: Payer: Self-pay | Admitting: Student

## 2020-02-19 ENCOUNTER — Ambulatory Visit (HOSPITAL_COMMUNITY): Payer: BC Managed Care – PPO

## 2020-02-19 ENCOUNTER — Ambulatory Visit (HOSPITAL_COMMUNITY)
Admission: RE | Admit: 2020-02-19 | Discharge: 2020-02-19 | Disposition: A | Discharge status: Home / Self Care | Payer: BC Managed Care – PPO | Source: Ambulatory Visit | Attending: Adult Health | Admitting: Adult Health

## 2020-02-19 ENCOUNTER — Encounter (HOSPITAL_COMMUNITY): Payer: Self-pay

## 2020-02-19 ENCOUNTER — Other Ambulatory Visit: Payer: Self-pay

## 2020-02-19 DIAGNOSIS — Z452 Encounter for adjustment and management of vascular access device: Secondary | ICD-10-CM | POA: Insufficient documentation

## 2020-02-19 DIAGNOSIS — C3490 Malignant neoplasm of unspecified part of unspecified bronchus or lung: Secondary | ICD-10-CM

## 2020-02-19 DIAGNOSIS — J8410 Pulmonary fibrosis, unspecified: Secondary | ICD-10-CM | POA: Diagnosis present

## 2020-02-19 HISTORY — PX: IR REMOVAL TUN ACCESS W/ PORT W/O FL MOD SED: IMG2290

## 2020-02-19 LAB — GLUCOSE, CAPILLARY: Glucose-Capillary: 130 mg/dL — ABNORMAL HIGH (ref 70–99)

## 2020-02-19 MED ORDER — MIDAZOLAM HCL 2 MG/2ML IJ SOLN
INTRAMUSCULAR | Status: AC
  Filled 2020-02-19: qty 4

## 2020-02-19 MED ORDER — FENTANYL CITRATE (PF) 100 MCG/2ML IJ SOLN
INTRAMUSCULAR | Status: AC | PRN
  Administered 2020-02-19: 50 ug via INTRAVENOUS

## 2020-02-19 MED ORDER — CEFAZOLIN SODIUM-DEXTROSE 2-4 GM/100ML-% IV SOLN: 2.0000 g | INTRAVENOUS | Status: DC

## 2020-02-19 MED ORDER — MIDAZOLAM HCL 2 MG/2ML IJ SOLN
INTRAMUSCULAR | Status: AC | PRN
  Administered 2020-02-19: 1 mg via INTRAVENOUS

## 2020-02-19 MED ORDER — LIDOCAINE HCL 1 % IJ SOLN
INTRAMUSCULAR | Status: AC
  Filled 2020-02-19: qty 20

## 2020-02-19 MED ORDER — SODIUM CHLORIDE 0.9 % IV SOLN: INTRAVENOUS | Status: DC

## 2020-02-19 MED ORDER — FENTANYL CITRATE (PF) 100 MCG/2ML IJ SOLN
INTRAMUSCULAR | Status: AC
  Filled 2020-02-19: qty 4

## 2020-02-19 NOTE — Sedation Documentation (Signed)
Patient eating chips and drinking a soda tolerating well,

## 2020-02-19 NOTE — Procedures (Signed)
Interventional Radiology Procedure Note  Procedure: Portacatheter removal.   Complications: None.   Estimated Blood Loss: None.   Recommendations: - DC home after sedation recovery, approx 30 minutes   Signed,  Criselda Peaches, MD

## 2020-02-19 NOTE — H&P (Signed)
Chief Complaint: Patient was seen in consultation today for port removal.  Referring Physician(s): Parrett,[name] S  Supervising Physician: Jacqulynn Cadet  Patient Status: [facility] - Out-pt  History of Present Illness: Tasha Ballard is a 49 y.o. female with a past medical history significant for DM, sleep apnea, lung cancer (diagnosed 2021) and idiopathy pulmonary fibrosis currently followed by Dr. [name] who presents today for port removal. Tasha Ballard had a port placed in IR on 01/26/18 for IV cytoxan which she has since completed. On a recent CT chest w/o contrast (01/24/20) the port was incidentally noted to be looped on itself in the right IJ. IR has been asked to remove the port today.  Tasha Ballard reports that the port hasn't been used in about year, it was last used for IV antibiotics. She is no longer receiving systemic IV therapy and has had no problems at the port site that she is aware of. She is working towards a lung transplant hopefully in the near future. She is looking forward to a trip to [place] this weekend. She understands the procedure today and is agreeable to proceed as planned.  Past Medical History:  Diagnosis Date  . Aneurysm ([place])   . Back pain   . Cancer ([place])   . Diabetes mellitus without complication ([place])   . H/O blood clots   . Hypertension   . Joint pain   . Pulmonary fibrosis ([place])   . SOB (shortness of breath)   . Swelling   . Thyroid disease   . Vitamin D deficiency     Past Surgical History:  Procedure Laterality Date  . ABLATION    . IR IMAGING GUIDED PORT INSERTION  01/26/2018  . KNEE SURGERY    . LUNG BIOPSY      Allergies: Macrobid [nitrofurantoin macrocrystal] and Adhesive [tape]  Medications: Prior to Admission medications   Medication Sig Start Date End Date Taking? Authorizing Provider  azathioprine (IMURAN) 100 MG tablet Take 1 tablet (100 mg total) by mouth daily. 07/05/19  Yes Brand Males, MD  carvedilol (COREG)  6.25 MG tablet Take 6.25 mg by mouth 2 (two) times daily with a meal.   Yes [provider]  famotidine (PEPCID) 40 MG tablet TAKE 1 TABLET BY MOUTH EVERY DAY Patient taking differently: Take 40 mg by mouth daily. 08/16/19  Yes Brand Males, MD  folic acid (FOLVITE) 1 MG tablet Take 1 tablet (1 mg total) by mouth daily. 02/05/20  Yes Parrett, [name] S, NP  furosemide (LASIX) 40 MG tablet Take 40 mg by mouth See admin instructions. Take 1 tablet (40 mg) daily, may repeat dose if needed for fluid retention.   Yes [provider]  gabapentin (NEURONTIN) 100 MG capsule Take 100 mg by mouth at bedtime as needed (For pain). 09/06/18  Yes [provider]  metFORMIN (GLUCOPHAGE) 500 MG tablet Take 500 mg by mouth 2 (two) times daily.    Yes [provider]  montelukast (SINGULAIR) 10 MG tablet TAKE 1 TABLET BY MOUTH EVERY DAY AFTER SUPPER Patient taking differently: Take 10 mg by mouth daily after supper. 01/11/20  Yes Ramaswamy, Belva Crome, MD  OFEV 150 MG CAPS TAKE 1 CAPSULE TWICE A DAY (12 HOURS APART) WITH FOOD Patient taking differently: Take 150 mg by mouth in the morning and at bedtime. 12/12/19  Yes Brand Males, MD  potassium chloride (KLOR-CON) 10 MEQ tablet Take 3 tablets (30 mEq total) by mouth daily. Patient taking differently: Take 10-20 mEq by mouth  See admin instructions. Take 10 meq in the morning and 20 meq at night 09/19/19  Yes Ramaswamy, Belva Crome, MD  predniSONE (DELTASONE) 10 MG tablet Take 1 tablet (10 mg total) by mouth daily. 12/28/19  Yes Aline August, MD  saccharomyces boulardii (FLORASTOR) 250 MG capsule Take 1 capsule (250 mg total) by mouth 2 (two) times daily. Patient taking differently: Take 250 mg by mouth daily. 09/11/19  Yes Brand Males, MD  spironolactone (ALDACTONE) 100 MG tablet Take 100 mg by mouth daily.   Yes [provider]  sulfamethoxazole-trimethoprim (BACTRIM) 400-80 MG tablet Take 1 tablet by mouth every  Monday, Wednesday, and Friday.   Yes [provider]  thyroid (ARMOUR) 90 MG tablet Take 90 mg by mouth daily before breakfast.    Yes [provider]  benzonatate (TESSALON) 200 MG capsule Take 1 capsule (200 mg total) by mouth 3 (three) times daily as needed for cough. 09/14/18   Lauraine Rinne, NP  ESBRIET 801 MG TABS TAKE 1 TABLET BY MOUTH 3 (THREE) TIMES DAILY WITH MEALS. Patient taking differently: Take 801 mg by mouth 3 (three) times daily with meals. 07/31/19   Brand Males, MD  ibuprofen (ADVIL) 200 MG tablet Take 400-600 mg by mouth every 6 (six) hours as needed for headache or moderate pain.    [provider]  ondansetron (ZOFRAN) 8 MG tablet TAKE 1 TABLET BY MOUTH EVERY 8 HOURS AS NEEDED FOR NAUSEA AND VOMITING Patient taking differently: Take 8 mg by mouth every 8 (eight) hours as needed for nausea or vomiting. 06/28/19   Brand Males, MD  phenazopyridine (PYRIDIUM) 200 MG tablet Take 1 tablet (200 mg total) by mouth 3 (three) times daily as needed for pain. Patient not taking: No sig reported 02/05/20   Tamela Gammon, NP  PROAIR HFA 108 [phone] Base) MCG/ACT inhaler Inhale 2 puffs into the lungs every 6 (six) hours as needed for wheezing or shortness of breath. 04/11/19   Brand Males, MD  Vitamin D, Ergocalciferol, (DRISDOL) 1.25 MG (50000 UT) CAPS capsule TAKE 1 CAPSULE BY MOUTH ONCE WEEKLY Patient taking differently: Take 50,000 Units by mouth every Saturday. 04/24/18   Starlyn Skeans, MD     Family History  Problem Relation Age of Onset  . Heart failure Mother   . Heart disease Mother   . Rheumatologic disease Father     Social History   Socioeconomic History  . Marital status: Married    Spouse name: Hedy Camara  . Number of children: 2  . Years of education: Not on file  . Highest education level: Not on file  Occupational History  . Not on file  Tobacco Use  . Smoking status: Never Smoker  . Smokeless tobacco: Never Used  Vaping  Use  . Vaping Use: Never used  Substance and Sexual Activity  . Alcohol use: Yes    Comment: Occasional  . Drug use: Never  . Sexual activity: Not Currently    Comment: intercourse age 2, less than 5 sexual partners  Other Topics Concern  . Not on file  Social History Narrative  . Not on file   Social Determinants of Health   Financial Resource Strain: Not on file  Food Insecurity: Not on file  Transportation Needs: Not on file  Physical Activity: Not on file  Stress: Not on file  Social Connections: Not on file     Review of Systems: A 12 point ROS discussed and pertinent positives are indicated in the HPI  above.  All other systems are negative.  Review of Systems  Constitutional: Negative for chills and fever.  Respiratory: Negative for cough and shortness of breath.   Cardiovascular: Negative for chest pain.  Gastrointestinal: Negative for abdominal pain, nausea and vomiting.  Musculoskeletal: Negative for back pain.  Neurological: Negative for dizziness and headaches.    Vital Signs: BP (!) 119/93   Pulse (!) 101   Temp 97.6 F (36.4 C) (Oral)   Resp 18   Ht _0  (1.575 m)   Wt 189 lb (85.7 kg)   SpO2 95%   BMI 34.57 kg/m   Physical Exam Vitals reviewed.  Constitutional:      General: She is not in acute distress. HENT:     Head: Normocephalic.     Mouth/Throat:     Mouth: Mucous membranes are moist.     Pharynx: Oropharynx is clear. No oropharyngeal exudate or posterior oropharyngeal erythema.  Cardiovascular:     Rate and Rhythm: Normal rate and regular rhythm.  Pulmonary:     Comments: (+) supplemental O2 via [place] Decreased lung sounds bilaterally Abdominal:     General: There is no distension.     Palpations: Abdomen is soft.     Tenderness: There is no abdominal tenderness.  Skin:    General: Skin is warm and dry.  Neurological:     Mental Status: She is alert and oriented to person, place, and time.  Psychiatric:        Mood and Affect:  Mood normal.        Behavior: Behavior normal.        Thought Content: Thought content normal.        Judgment: Judgment normal.      MD Evaluation Airway: WNL (Continuous O2 via [place] at home; 8-10 L at rest, 10 -15L with exertion) Heart: WNL Abdomen: WNL Chest/ Lungs: WNL ASA  Classification: 3 Mallampati/Airway Score: Two   Imaging: CT Chest Wo Contrast  Result Date: 01/24/2020 CLINICAL DATA:  Interstitial lung disease.  Lung cancer. EXAM: CT CHEST WITHOUT CONTRAST TECHNIQUE: Multidetector CT imaging of the chest was performed following the standard protocol without IV contrast. COMPARISON:  10/01/2019 FINDINGS: Cardiovascular: The heart size is normal. No substantial pericardial effusion. Coronary artery calcification is evident. Atherosclerotic calcification is noted in the wall of the thoracic aorta. Right IJ central line has become looped on itself in the internal jugular vein since prior study although the tip is still directed distally and positioned at the innominate vein confluence. Mediastinum/Nodes: No mediastinal lymphadenopathy. No evidence for gross hilar lymphadenopathy although assessment is limited by the lack of intravenous contrast on today's study. The esophagus has normal imaging features. Lungs/Pleura: Underlying changes of fibrotic lung disease again noted. Appearance is relatively stable except in the posteromedial left base where ground-glass attenuation seen previously is now more confluent/consolidative. The 1.2 x 1.0 cm nodule in the medial left base measured on the prior study is no longer discretely visible likely due to incorporation into the increasing consolidative opacity in the posterior left lung base. 7 mm left upper lobe nodule on 36/5 is unchanged. Right upper lobe staple line again noted. Nodular opacity in the medial left base Upper Abdomen: Vascular embolization coils noted mid abdomen with incomplete visualization of SMA stent device. Musculoskeletal: No  worrisome lytic or sclerotic osseous abnormality. IMPRESSION: 1. Interval progression of consolidative opacity in the posteromedial left lung base, now incorporating the nodular opacity seen on the prior study. Imaging features are  nonspecific, but infectious/inflammatory etiology could have this appearance. 2. Otherwise similar appearance of background fibrotic lung disease. 3. 7 mm left upper lobe pulmonary nodule is stable. 4. Right IJ central line has become looped on itself in the right internal jugular vein since prior study although the tip is still directed distally and positioned at the innominate vein confluence. Correlation with central line function suggested. 5. Aortic Atherosclerosis (ICD10-I70.0). Electronically Signed   By: Misty Stanley M.D.   On: 01/24/2020 13:39    Labs:  CBC: Recent Labs    12/26/19 0350 12/27/19 0407 12/28/19 0340 01/25/20 1049  WBC 3.5* 4.0 4.3 9.4  HGB 9.2* 9.7* 10.5* 12.9  HCT 30.2* 31.5* 33.9* 39.4  PLT 204 214 221 252.0    COAGS: No results for input(s): INR, APTT in the last 8760 hours.  BMP: Recent Labs    12/25/19 0355 12/26/19 0350 12/27/19 0407 12/28/19 0340  NA 138 140 140 138  K 3.8 3.9 3.6 3.0*  CL 103 108 102 94*  CO2 _0 33*  GLUCOSE 98 93 87 104*  BUN <5* <5* <5* 7  CALCIUM 8.4* 8.2* 8.1* 8.5*  CREATININE 0.61 0.49 0.53 0.54  GFRNONAA >60 >60 >60 >60    LIVER FUNCTION TESTS: Recent Labs    05/28/19 1548 07/03/19 0947 08/08/19 0948 09/11/19 1510 12/22/19 0315  BILITOT 0.3 0.3 0.3  --  0.8  AST _1 --  17  ALT _2 ALKPHOS 46 52 36*  --  35*  PROT 6.7 6.3 5.9*  --  5.6*  ALBUMIN 4.1 3.8 3.7  --  3.2*    TUMOR MARKERS: No results for input(s): AFPTM, CEA, CA199, CHROMGRNA in the last 8760 hours.  Assessment and Plan:  49 y/o F with history of ILD and recently diagnosed lung cancer incidentally noted to have port with catheter looped within IJ who presents today for port removal.  Patient reports last use of port was about a year ago, she had originally wanted to have the port replaced but after discussion with her team she has decided that removing the port without replacement right now would be the best option.  Patient has been NPO since midnight, no current anticoagulation/antiplatelet medications.  Risks and benefits of image-guided Port-a-catheter removal were discussed with the patient including, but not limited to bleeding, infection, pneumothorax and need for additional procedures.  All of the patient's questions were answered, patient is agreeable to proceed.  Consent signed and in chart.  Thank you for this interesting consult.  I greatly enjoyed meeting ALMARIE KURDZIEL and look forward to participating in their care.  A copy of this report was sent to the requesting provider on this date.  Electronically Signed: Joaquim Nam, PA-C 02/19/2020, 11:45 AM   I spent a total of 15 Minutes in face to face in clinical consultation, greater than 50% of which was counseling/coordinating care for port removal.

## 2020-02-19 NOTE — Discharge Instructions (Addendum)
Implanted Port Removal, Care After This sheet gives you information about how to care for yourself after your procedure. Your health care provider may also give you more specific instructions. If you have problems or questions, contact your health care provider. What can I expect after the procedure? After the procedure, it is common to have:  Soreness or pain near your incision.  Some swelling or bruising near your incision. Follow these instructions at home: Medicines  Take over-the-counter and prescription medicines only as told by your health care provider.  If you were prescribed an antibiotic medicine, take it as told by your health care provider. Do not stop taking the antibiotic even if you start to feel better. Bathing  Do not take baths, swim, or use a hot tub until your health care provider approves. Ask your health care provider if you can take showers. You may only be allowed to take sponge baths. Incision care   Follow instructions from your health care provider about how to take care of your incision. Make sure you: ? Wash your hands with soap and water before you change your bandage (dressing). If soap and water are not available, use hand sanitizer. ? Change your dressing as told by your health care provider. ? Keep your dressing dry. ? Leave stitches (sutures), skin glue, or adhesive strips in place. These skin closures may need to stay in place for 2 weeks or longer. If adhesive strip edges start to loosen and curl up, you may trim the loose edges. Do not remove adhesive strips completely unless your health care provider tells you to do that.  Check your incision area every day for signs of infection. Check for: ? More redness, swelling, or pain. ? More fluid or blood. ? Warmth. ? Pus or a bad smell. Driving   Do not drive for 24 hours if you were given a medicine to help you relax (sedative) during your procedure.  If you did not receive a sedative, ask your  health care provider when it is safe to drive. Activity  Return to your normal activities as told by your health care provider. Ask your health care provider what activities are safe for you.  Do not lift anything that is heavier than 10 lb (4.5 kg), or the limit that you are told, until your health care provider says that it is safe.  Do not do activities that involve lifting your arms over your head. General instructions  Do not use any products that contain nicotine or tobacco, such as cigarettes and e-cigarettes. These can delay healing. If you need help quitting, ask your health care provider.  Keep all follow-up visits as told by your health care provider. This is important. Contact a health care provider if:  You have more redness, swelling, or pain around your incision.  You have more fluid or blood coming from your incision.  Your incision feels warm to the touch.  You have pus or a bad smell coming from your incision.  You have pain that is not relieved by your pain medicine. Get help right away if you have:  A fever or chills.  Chest pain.  Difficulty breathing. Summary  After the procedure, it is common to have pain, soreness, swelling, or bruising near your incision.  If you were prescribed an antibiotic medicine, take it as told by your health care provider. Do not stop taking the antibiotic even if you start to feel better.  Do not drive for 24 hours  if you were given a sedative during your procedure.  Return to your normal activities as told by your health care provider. Ask your health care provider what activities are safe for you. This information is not intended to replace advice given to you by your health care provider. Make sure you discuss any questions you have with your health care provider. Document Revised: 03/24/2017 Document Reviewed: 03/24/2017 Elsevier Patient Education  [place]. Moderate Conscious Sedation, Adult Sedation is the  use of medicines to promote relaxation and relieve discomfort and anxiety. Moderate conscious sedation is a type of sedation. Under moderate conscious sedation, you are less alert than normal, but you are still able to respond to instructions, touch, or both. Moderate conscious sedation is used during short medical and dental procedures. It is milder than deep sedation, which is a type of sedation under which you cannot be easily woken up. It is also milder than general anesthesia, which is the use of medicines to make you unconscious. Moderate conscious sedation allows you to return to your regular activities sooner. Tell a health care provider about:  Any allergies you have.  All medicines you are taking, including vitamins, herbs, eye drops, creams, and over-the-counter medicines.  Use of steroids (by mouth or creams).  Any problems you or family members have had with sedatives and anesthetic medicines.  Any blood disorders you have.  Any surgeries you have had.  Any medical conditions you have, such as sleep apnea.  Whether you are pregnant or may be pregnant.  Any use of cigarettes, alcohol, marijuana, or street drugs. What are the risks? Generally, this is a safe procedure. However, problems may occur, including:  Getting too much medicine (oversedation).  Nausea.  Allergic reaction to medicines.  Trouble breathing. If this happens, a breathing tube may be used to help with breathing. It will be removed when you are awake and breathing on your own.  Heart trouble.  Lung trouble. What happens before the procedure? Staying hydrated Follow instructions from your health care provider about hydration, which may include:  Up to 2 hours before the procedure - you may continue to drink clear liquids, such as water, clear fruit juice, black coffee, and plain tea. Eating and drinking restrictions Follow instructions from your health care provider about eating and drinking, which  may include:  8 hours before the procedure - stop eating heavy meals or foods such as meat, fried foods, or fatty foods.  6 hours before the procedure - stop eating light meals or foods, such as toast or cereal.  6 hours before the procedure - stop drinking milk or drinks that contain milk.  2 hours before the procedure - stop drinking clear liquids. Medicine Ask your health care provider about:  Changing or stopping your regular medicines. This is especially important if you are taking diabetes medicines or blood thinners.  Taking medicines such as aspirin and ibuprofen. These medicines can thin your blood. Do not take these medicines before your procedure if your health care provider instructs you not to.  Tests and exams  You will have a physical exam.  You may have blood tests done to show: ? How well your kidneys and liver are working. ? How well your blood can clot. General instructions  Plan to have someone take you home from the hospital or clinic.  If you will be going home right after the procedure, plan to have someone with you for 24 hours. What happens during the procedure?  An IV tube will be inserted into one of your veins.  Medicine to help you relax (sedative) will be given through the IV tube.  The medical or dental procedure will be performed. What happens after the procedure?  Your blood pressure, heart rate, breathing rate, and blood oxygen level will be monitored often until the medicines you were given have worn off.  Do not drive for 24 hours. This information is not intended to replace advice given to you by your health care provider. Make sure you discuss any questions you have with your health care provider. Document Revised: 01/21/2017 Document Reviewed: 05/31/2015 Elsevier Patient Education  2020 [employer].

## 2020-02-21 ENCOUNTER — Ambulatory Visit (HOSPITAL_COMMUNITY): Payer: BC Managed Care – PPO

## 2020-02-21 ENCOUNTER — Telehealth: Payer: Self-pay | Admitting: Adult Health

## 2020-02-21 ENCOUNTER — Encounter (HOSPITAL_COMMUNITY): Admission: RE | Admit: 2020-02-21 | Payer: Self-pay | Source: Ambulatory Visit

## 2020-02-21 ENCOUNTER — Encounter (HOSPITAL_COMMUNITY): Payer: Self-pay

## 2020-02-21 MED ORDER — GABAPENTIN 100 MG PO CAPS: 100.0000 mg | ORAL_CAPSULE | Freq: Every evening | ORAL | 5 refills | Status: DC | PRN

## 2020-02-21 NOTE — Telephone Encounter (Signed)
Requests refill of gabapentin . rx sent in .

## 2020-02-26 ENCOUNTER — Encounter (HOSPITAL_COMMUNITY): Payer: Self-pay

## 2020-02-26 ENCOUNTER — Encounter (HOSPITAL_COMMUNITY): Payer: Self-pay | Attending: Internal Medicine

## 2020-02-26 ENCOUNTER — Ambulatory Visit (HOSPITAL_COMMUNITY): Payer: BC Managed Care – PPO

## 2020-02-26 DIAGNOSIS — J84112 Idiopathic pulmonary fibrosis: Secondary | ICD-10-CM | POA: Insufficient documentation

## 2020-02-28 ENCOUNTER — Ambulatory Visit (HOSPITAL_COMMUNITY): Payer: BC Managed Care – PPO

## 2020-02-28 ENCOUNTER — Encounter (HOSPITAL_COMMUNITY): Payer: Self-pay

## 2020-03-03 MED FILL — ESBRIET 801 MG TABS: 801 | 30 days supply | Qty: 90 | Fill #7

## 2020-03-04 ENCOUNTER — Ambulatory Visit (HOSPITAL_COMMUNITY): Payer: BC Managed Care – PPO

## 2020-03-04 ENCOUNTER — Encounter (HOSPITAL_COMMUNITY): Payer: Self-pay

## 2020-03-04 ENCOUNTER — Ambulatory Visit: Payer: BC Managed Care – PPO | Admitting: Adult Health

## 2020-03-06 ENCOUNTER — Encounter (HOSPITAL_COMMUNITY): Payer: Self-pay

## 2020-03-06 ENCOUNTER — Ambulatory Visit (HOSPITAL_COMMUNITY): Payer: BC Managed Care – PPO

## 2020-03-11 ENCOUNTER — Encounter (HOSPITAL_COMMUNITY): Payer: Self-pay

## 2020-03-11 ENCOUNTER — Ambulatory Visit (HOSPITAL_COMMUNITY): Payer: BC Managed Care – PPO

## 2020-03-13 ENCOUNTER — Encounter: Payer: Self-pay | Admitting: Adult Health

## 2020-03-13 ENCOUNTER — Encounter (HOSPITAL_COMMUNITY): Payer: Self-pay

## 2020-03-13 ENCOUNTER — Other Ambulatory Visit: Payer: Self-pay

## 2020-03-13 ENCOUNTER — Ambulatory Visit (HOSPITAL_COMMUNITY): Payer: BC Managed Care – PPO

## 2020-03-13 ENCOUNTER — Ambulatory Visit: Payer: BC Managed Care – PPO | Admitting: Adult Health

## 2020-03-13 VITALS — BP 132/82 | HR 99 | Temp 97.4°F | Ht 62.0 in | Wt 195.8 lb

## 2020-03-13 DIAGNOSIS — Z79899 Other long term (current) drug therapy: Secondary | ICD-10-CM

## 2020-03-13 DIAGNOSIS — J849 Interstitial pulmonary disease, unspecified: Secondary | ICD-10-CM

## 2020-03-13 DIAGNOSIS — J9611 Chronic respiratory failure with hypoxia: Secondary | ICD-10-CM

## 2020-03-13 DIAGNOSIS — Z6841 Body Mass Index (BMI) 40.0 and over, adult: Secondary | ICD-10-CM

## 2020-03-13 DIAGNOSIS — E038 Other specified hypothyroidism: Secondary | ICD-10-CM | POA: Diagnosis not present

## 2020-03-13 DIAGNOSIS — C3432 Malignant neoplasm of lower lobe, left bronchus or lung: Secondary | ICD-10-CM

## 2020-03-13 LAB — COMPREHENSIVE METABOLIC PANEL
ALT: 15 U/L (ref 0–35)
AST: 13 U/L (ref 0–37)
Albumin: 4.1 g/dL (ref 3.5–5.2)
Alkaline Phosphatase: 45 U/L (ref 39–117)
BUN: 11 mg/dL (ref 6–23)
CO2: 36 mEq/L — ABNORMAL HIGH (ref 19–32)
Calcium: 9.8 mg/dL (ref 8.4–10.5)
Chloride: 94 mEq/L — ABNORMAL LOW (ref 96–112)
Creatinine, Ser: 0.82 mg/dL (ref 0.40–1.20)
GFR: 82.74 mL/min (ref >60.00)
Glucose, Bld: 106 mg/dL — ABNORMAL HIGH (ref 70–99)
Potassium: 3.2 mEq/L — ABNORMAL LOW (ref 3.5–5.1)
Sodium: 138 mEq/L (ref 135–145)
Total Bilirubin: 0.4 mg/dL (ref 0.2–1.2)
Total Protein: 6.4 g/dL (ref 6.0–8.3)

## 2020-03-13 LAB — TSH: TSH: 1.02 u[IU]/mL (ref 0.35–4.50)

## 2020-03-13 NOTE — Assessment & Plan Note (Signed)
Left lower lobe lung nodule suspicious for malignancy.  Patient is status post SBRT July 2021. CT scan December showed progressive consolidative changes in the left base.  Scans were reviewed in detail with Dr. [name].  Patient will have a follow-up CT chest in June.

## 2020-03-13 NOTE — Assessment & Plan Note (Signed)
ILD-progressive NSIP On a very aggressive maintenance regimen with Ofev, Esbriet, Imuran, chronic steroids Continues to work towards goal weight to be considered for possible lung transplant Has had a slight clinical decline since radiation last year.  Pulmonary function test last visit showed a drop in her DLCO. Patient is continue on pulmonary rehab.  Has had no increased oxygen demands.  Plan  Patient Instructions  Saline nasal spray and gel As needed   Continue on Ofev 150 mg twice daily Continue on prednisone 10 mg daily Continue Imuran  Labs today .  Esbriet as directed.  Continue on oxygen-goal is to keep oxygen levels greater than 88 to 90% Continue with Pulmonary rehab  Work on healthy weight loss.  HRCT Chest in June 2022.  Follow-up in 6  weeks with Dr. [name] in ILD clinic with  Please contact office for sooner follow up if symptoms do not improve or worsen or seek emergency care

## 2020-03-13 NOTE — Assessment & Plan Note (Signed)
Patient continues to lose weight.  BMI is down to 35.  Patient is to continue with weight loss efforts.

## 2020-03-13 NOTE — Assessment & Plan Note (Signed)
Patient is continue follow-up with her primary care provider.  Wants labs done today and sent to her PCP.

## 2020-03-13 NOTE — Progress Notes (Signed)
_0  ID: Tasha Ballard, female    DOB: [birth year], 50 y.o.   MRN: [id number]  Chief Complaint  Patient presents with  . ILD     Referring provider: [name], MD  HPI: 50 year old female never smoker followed for very complicated medical history with interstitial lung disease-progressive NSIP and lung cancer diagnosed in 2021 Patient was seen for pulmonary consult 2019 to establish for ILD.  Previously seen at [facility].  Underwent surgical lung biopsy 2013 that showed fibrotic NSIP.  Initial treated with CellCept.  2016 had a significant decline and was started on oxygen.  She has also been evaluated at [facility] seen by Dr. 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  This led to progressive respiratory decline and increased symptom burden since this time.  She was treated with 1 full year of oral and IV Cytoxan from December 2019 to November 2020.  Started on Ofev in December 2019.  Started on Imuran January 2021.  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  She is on chronic steroids with prednisone 10 mg daily.  New diagnosis of lung cancer 2021 with a 1 cm left lower lobe lung nodule follow-up scan showed growth at 1.5 cm May 2021.  PET scan showed marked hypermetabolic activity with SUV at 7.7.  No evidence of distant disease.  Felt to be high risk for biopsy due to severe interstitial lung disease and high oxygen demands.  She was referred to radiation oncology and underwent SBRT with completion August 24, 2019.  Port-A-Cath removed February 19, 2020.  TEST/EVENTS :  High-resolution CT chest April 27, 2019-stable UIP changes no progression since September 2020. New 1  cm left lower lobe nodule  High-resolution CT chest Jul 19, 2019-interval growth of the left lower lobe nodule 1.5 cm. UIP changes stable  CT chest October 02, 2019 stable UIP pattern. Slight interval decrease in the left lung base nodule. Consistent with treatment response.  January 24, 2020 CT chest underlying changes of fibrotic lung disease, relatively stable groundglass attenuation in left base.  Previous left medial lung nodule measuring 1.2 cm no longer discretely visible.  Possibly secondary to increased consolidative opacity along the left lung base.  Unchanged left upper lobe 7 mm nodule.  Nodular opacity in the medial left base.  PFT January 25, 2020 FEV1 40%, ratio 70, FVC 35%, DLCO 27% PFT November 20, 2018 FEV1 40%, ratio 91, FVC 35%, DLCO 37% PFT March 30, 2018 FEV1 38%, ratio 93%, FVC 32%, DLCO 34%  03/13/2020 follow-up, progressive NSIP.,  Oxygen pendant respiratory failure, lung cancer Patient returns for a 1 month follow-up.  Patient had hospitalization in November for recurrent urinary tract infection.  Urine culture showed ESBL Proteus mirabilis.  She improved with IV antibiotics.  CT was negative for pyelonephritis or urinary tract obstruction.  She has been referred to gynecology and urology.  Patient says she is doing much better she was seen by gynecology and placed on some preventative medications for vaginal dryness which has really helped a lot. Since last visit she has had her Port-A-Cath removed.  Tolerated well.  Patient has ILD with progressive NSIP.  She is on an aggressive maintenance regimen with Imuran, Esbriet,  Ofev and chronic steroids with prednisone 10 mg daily.  During her episodes of recurrent UTIs.  Imuran was held.  It is recently been restarted and patient is currently back to her baseline dose.  Since last visit patient is doing about the same, gets winded easily . Does not feel her breathing has bounced back since hospitalization.  Patient  continues to work on weight loss.  She is working towards a goal for her consideration of lung transplant. Needs to get to 175lbs.  Last visit pulmonary function testing showed a decline in her lung function. This was the first PFT since Radiation was completed.  Patient is participating in pulmonary rehab. Has been off for last couple of weeks due to travel and holidays.  Got choked yesterday taking pills, caused her throat to be irritated. Coughed so hard she threw up . Concerned this will cause her problems. No fever or discolored mucus . No increased o2 demands.   Recently went on a trip to [place] with her family and had a very good time.  As above newly diagnosed of lung cancer 2021 with a left lower lobe lung nodule.  PET scan was markedly hypermetabolic.  She was felt to high risk for biopsy.  She was referred to radiation oncology and underwent SBRT which was completed in August 24, 2019.  Most recent serial CT scan in December showed fibrotic lung disease changes.  Progressive consolidative pasty along the left lung base.  Unchanged left upper lobe 7 mm nodule.  And nodular pacing along the medial left lung base.  Scans were reviewed with patient and Dr. [name].  We will plan on a follow-up CT chest in 6 months.  Allergies  Allergen Reactions  . Macrobid [Nitrofurantoin Macrocrystal] Nausea And Vomiting  . Adhesive [Tape] Rash    Immunization History  Administered Date(s) Administered  . Fluad Quad(high Dose 65+) 12/28/2019  . Influenza,inj,Quad PF,6+ Mos 12/24/2014, 02/05/2016, 01/07/2017, 11/22/2017, 10/20/2018  . Influenza-Unspecified 12/24/2014, 02/05/2016, 01/07/2017, 11/22/2017  . PFIZER(Purple Top)SARS-COV-2 Vaccination 04/06/2019, 04/30/2019, 10/18/2019  . Pneumococcal Conjugate-13 07/20/2017  . Pneumococcal Polysaccharide-23 06/09/2012, 01/03/2019  . Tdap 02/19/2011    Past Medical History:  Diagnosis Date  . Aneurysm ([place])   . Back pain   . Cancer ([place])   . Diabetes  mellitus without complication ([place])   . H/O blood clots   . Hypertension   . Joint pain   . Pulmonary fibrosis ([place])   . SOB (shortness of breath)   . Swelling   . Thyroid disease   . Vitamin D deficiency     Tobacco History: Social History   Tobacco Use  Smoking Status Never Smoker  Smokeless Tobacco Never Used   Counseling given: Not Answered   Outpatient Medications Prior to Visit  Medication Sig Dispense Refill  . azathioprine (IMURAN) 100 MG tablet Take 1 tablet (100 mg total) by mouth daily. 90 tablet 3  . benzonatate (TESSALON) 200 MG capsule Take 1 capsule (200 mg total) by mouth 3 (three) times daily as needed for cough. 30 capsule 1  . carvedilol (COREG) 6.25 MG tablet Take 6.25 mg by mouth 2 (two) times daily with a meal.    . ESBRIET 801 MG TABS TAKE 1 TABLET BY MOUTH 3 (THREE) TIMES DAILY WITH MEALS. (Patient taking differently: Take 801 mg by mouth 3 (three) times daily with meals.) 90 tablet 11  . famotidine (PEPCID) 40 MG tablet TAKE 1 TABLET BY MOUTH EVERY DAY (Patient taking differently: Take 40 mg by  mouth daily.) 90 tablet 3  . folic acid (FOLVITE) 1 MG tablet Take 1 tablet (1 mg total) by mouth daily. 30 tablet 5  . furosemide (LASIX) 40 MG tablet Take 40 mg by mouth See admin instructions. Take 1 tablet (40 mg) daily, may repeat dose if needed for fluid retention.    . gabapentin (NEURONTIN) 100 MG capsule Take 1 capsule (100 mg total) by mouth at bedtime as needed (For pain). 30 capsule 5  . ibuprofen (ADVIL) 200 MG tablet Take 400-600 mg by mouth every 6 (six) hours as needed for headache or moderate pain.    . montelukast (SINGULAIR) 10 MG tablet TAKE 1 TABLET BY MOUTH EVERY DAY AFTER SUPPER (Patient taking differently: Take 10 mg by mouth daily after supper.) 90 tablet 1  . OFEV 150 MG CAPS TAKE 1 CAPSULE TWICE A DAY (12 HOURS APART) WITH FOOD (Patient taking differently: Take 150 mg by mouth in the morning and at bedtime.) 60 capsule 11  . ondansetron  (ZOFRAN) 8 MG tablet TAKE 1 TABLET BY MOUTH EVERY 8 HOURS AS NEEDED FOR NAUSEA AND VOMITING (Patient taking differently: Take 8 mg by mouth every 8 (eight) hours as needed for nausea or vomiting.) 45 tablet 2  . phenazopyridine (PYRIDIUM) 200 MG tablet Take 1 tablet (200 mg total) by mouth 3 (three) times daily as needed for pain. 15 tablet 1  . potassium chloride (KLOR-CON) 10 MEQ tablet Take 3 tablets (30 mEq total) by mouth daily. (Patient taking differently: Take 10-20 mEq by mouth See admin instructions. Take 10 meq in the morning and 20 meq at night) 90 tablet 2  . predniSONE (DELTASONE) 10 MG tablet Take 1 tablet (10 mg total) by mouth daily.    Marland Kitchen PROAIR HFA 108 (90 Base) MCG/ACT inhaler Inhale 2 puffs into the lungs every 6 (six) hours as needed for wheezing or shortness of breath. 18 g 5  . saccharomyces boulardii (FLORASTOR) 250 MG capsule Take 1 capsule (250 mg total) by mouth 2 (two) times daily. (Patient taking differently: Take 250 mg by mouth daily.) 60 capsule 5  . spironolactone (ALDACTONE) 100 MG tablet Take 100 mg by mouth daily.    Marland Kitchen sulfamethoxazole-trimethoprim (BACTRIM) 400-80 MG tablet Take 1 tablet by mouth every Monday, Wednesday, and Friday.    . thyroid (ARMOUR) 90 MG tablet Take 90 mg by mouth daily before breakfast.     . Vitamin D, Ergocalciferol, (DRISDOL) 1.25 MG (50000 UT) CAPS capsule TAKE 1 CAPSULE BY MOUTH ONCE WEEKLY (Patient taking differently: Take 50,000 Units by mouth every Saturday.) 3 capsule 0  . metFORMIN (GLUCOPHAGE) 500 MG tablet Take 500 mg by mouth 2 (two) times daily.  (Patient not taking: Reported on 03/13/2020)     No facility-administered medications prior to visit.     Review of Systems:   Constitutional:   No  weight loss, night sweats,  Fevers, chills,  +fatigue, or  lassitude.  HEENT:   No headaches,  Difficulty swallowing,  Tooth/dental problems, or  Sore throat,                No sneezing, itching, ear ache, nasal congestion, post nasal  drip,   CV:  No chest pain,  Orthopnea, PND, swelling in lower extremities, anasarca, dizziness, palpitations, syncope.   GI  No heartburn, indigestion, abdominal pain,+  nausea, no vomiting, diarrhea, change in bowel habits, loss of appetite, bloody stools. +Constipation   Resp:  .  No chest wall deformity  Skin: no  rash or lesions.  GU: no dysuria, change in color of urine, no urgency or frequency.  No flank pain, no hematuria   MS:  No joint pain or swelling.  No decreased range of motion.  No back pain.    Physical Exam  BP 132/82 (BP Location: Left Arm, Cuff Size: Normal)   Pulse 99   Temp (!) 97.4 F (36.3 C)   Ht _0  (1.575 m)   Wt 195 lb 12.8 oz (88.8 kg)   SpO2 97%   BMI 35.81 kg/m   GEN: A/Ox3; pleasant , NAD, chronically ill appearing on O2    HEENT:  [place]/AT,    NOSE-clear, THROAT-clear, no lesions, no postnasal drip or exudate noted.   NECK:  Supple w/ fair ROM; no JVD; normal carotid impulses w/o bruits; no thyromegaly or nodules palpated; no lymphadenopathy.    RESP  BB crackles   no accessory muscle use, no dullness to percussion  CARD:  RRR, no m/r/g, tr  peripheral edema, pulses intact, no cyanosis or clubbing.  GI:   Soft & nt; nml bowel sounds; no organomegaly or masses detected.   Musco: Warm bil, no deformities or joint swelling noted.   Neuro: alert, no focal deficits noted.    Skin: Warm, no lesions or rashes    Lab Results:  CBC    Component Value Date/Time   WBC 9.4 01/25/2020 1049   RBC 4.23 01/25/2020 1049   HGB 12.9 01/25/2020 1049   HGB 11.1 02/13/2018 1118   HCT 39.4 01/25/2020 1049   HCT 37.2 02/13/2018 1118   PLT 252.0 01/25/2020 1049   MCV 93.0 01/25/2020 1049   MCV 81 02/13/2018 1118   MCH 31.3 12/28/2019 0340   MCHC 32.9 01/25/2020 1049   RDW 14.6 01/25/2020 1049   RDW 15.6 (H) 02/13/2018 1118   LYMPHSABS 1.7 01/25/2020 1049   LYMPHSABS 0.8 02/13/2018 1118   MONOABS 1.1 (H) 01/25/2020 1049   EOSABS 0.1 01/25/2020  1049   EOSABS 0.2 02/13/2018 1118   BASOSABS 0.0 01/25/2020 1049   BASOSABS 0.1 02/13/2018 1118    BMET    Component Value Date/Time   NA 138 12/28/2019 0340   NA 136 02/13/2018 1118   K 3.0 (L) 12/28/2019 0340   CL 94 (L) 12/28/2019 0340   CO2 33 (H) 12/28/2019 0340   GLUCOSE 104 (H) 12/28/2019 0340   BUN 7 12/28/2019 0340   BUN 11 02/13/2018 1118   CREATININE 0.54 12/28/2019 0340   CALCIUM 8.5 (L) 12/28/2019 0340   GFRNONAA >60 12/28/2019 0340   GFRAA >60 01/17/2019 0805    BNP No results found for: BNP  ProBNP    Component Value Date/Time   PROBNP 32.0 02/08/2019 1029    Imaging: IR REMOVAL TUN ACCESS W/ PORT W/O FL MOD SED  Result Date: 02/19/2020 INDICATION: 50 year old female with a history of pulmonary fibrosis. She had a port catheter placed by interventional radiology (Dr. Milta Deiters) on 01/26/2018 and underwent a series of intravenous infusions. She has now completed her therapy and the port catheter is no longer needed. She presents for removal of the same. EXAM: REMOVAL RIGHT IJ VEIN PORT-A-CATH MEDICATIONS: None. ANESTHESIA/SEDATION: Moderate (conscious) sedation was employed during this procedure. A total of Versed 1 mg and Fentanyl 50 mcg was administered intravenously. Moderate Sedation Time: 19 minutes. The patient's level of consciousness and vital signs were monitored continuously by radiology nursing throughout the procedure under my direct supervision. FLUOROSCOPY TIME:  None. COMPLICATIONS: None immediate. PROCEDURE:  Informed written consent was obtained from the patient after a thorough discussion of the procedural risks, benefits and alternatives. All questions were addressed. Maximal Sterile Barrier Technique was utilized including caps, mask, sterile gowns, sterile gloves, sterile drape, hand hygiene and skin antiseptic. A timeout was performed prior to the initiation of the procedure. The right chest was prepped and draped in a sterile fashion. Lidocaine was  utilized for local anesthesia. An incision was made over the previously healed surgical incision. Utilizing blunt dissection, the port catheter and reservoir were removed from the underlying subcutaneous tissue in their entirety. Securing sutures were also removed. The pocket was irrigated with a copious amount of sterile normal saline. The pocket was closed with interrupted 3-0 Vicryl stitches. The subcutaneous tissue was closed with 3-0 Vicryl interrupted subcutaneous stitches. A 4-0 Vicryl running subcuticular stitch was utilized to approximate the skin. Dermabond was applied. IMPRESSION: Successful right IJ vein Port-A-Cath explantation. Electronically Signed   By: Jacqulynn Cadet M.D.   On: 02/19/2020 14:14      PFT Results Latest Ref Rng & Units 01/25/2020 11/20/2018 03/30/2018  FVC-Pre L 1.15 1.16 1.09  FVC-Predicted Pre % 35 35 32  Pre FEV1/FVC % % 92 91 93  FEV1-Pre L 1.06 1.06 1.01  FEV1-Predicted Pre % 40 40 38  DLCO uncorrected ml/min/mmHg 5.44 7.29 6.85  DLCO UNC% % 27 37 34  DLCO corrected ml/min/mmHg 11.78 7.45 -  DLCO COR %Predicted % 60 37 -  DLVA Predicted % 150 93 92    No results found for: NITRICOXIDE      Assessment & Plan:   ILD (interstitial lung disease) ([place]) ILD-progressive NSIP On a very aggressive maintenance regimen with Ofev, Esbriet, Imuran, chronic steroids Continues to work towards goal weight to be considered for possible lung transplant Has had a slight clinical decline since radiation last year.  Pulmonary function test last visit showed a drop in her DLCO. Patient is continue on pulmonary rehab.  Has had no increased oxygen demands.  Plan  Patient Instructions  Saline nasal spray and gel As needed   Continue on Ofev 150 mg twice daily Continue on prednisone 10 mg daily Continue Imuran  Labs today .  Esbriet as directed.  Continue on oxygen-goal is to keep oxygen levels greater than 88 to 90% Continue with Pulmonary rehab  Work on healthy  weight loss.  HRCT Chest in June 2022.  Follow-up in 6  weeks with Dr. [name] in ILD clinic with  Please contact office for sooner follow up if symptoms do not improve or worsen or seek emergency care       Chronic respiratory failure with hypoxia ([place]) Continue on high flow oxygen to maintain O2 saturations greater than 88 to 90%.  No changes.  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  Patient is status post SBRT July 2021. CT scan December showed progressive consolidative changes in the left base.  Scans were reviewed in detail with Dr. [name].  Patient will have a follow-up CT chest in June.   Hypothyroidism Patient is continue follow-up with her primary care provider.  Wants labs done today and sent to her PCP.  Morbid obesity with BMI of 40.0-44.9, adult [facility]) Patient continues to lose weight.  BMI is down to 35.  Patient is to continue with weight loss efforts.  Medication management Patient education given.  Labs today.     Rexene Edison, NP 03/13/2020

## 2020-03-13 NOTE — Assessment & Plan Note (Signed)
Patient education given.  Labs today.

## 2020-03-13 NOTE — Assessment & Plan Note (Signed)
Continue on high flow oxygen to maintain O2 saturations greater than 88 to 90%.  No changes.

## 2020-03-13 NOTE — Patient Instructions (Addendum)
Saline nasal spray and gel As needed   Continue on Ofev 150 mg twice daily Continue on prednisone 10 mg daily Continue Imuran  Labs today .  Esbriet as directed.  Continue on oxygen-goal is to keep oxygen levels greater than 88 to 90% Continue with Pulmonary rehab  Work on healthy weight loss.  HRCT Chest in June 2022.  Follow-up in 6  weeks with Dr. [name] in ILD clinic with  Please contact office for sooner follow up if symptoms do not improve or worsen or seek emergency care

## 2020-03-18 ENCOUNTER — Encounter (HOSPITAL_COMMUNITY): Payer: Self-pay

## 2020-03-18 ENCOUNTER — Ambulatory Visit (HOSPITAL_COMMUNITY): Payer: BC Managed Care – PPO

## 2020-03-19 NOTE — Telephone Encounter (Signed)
Type Date User Summary Attachment  General 02/01/2020 8:16 AM Vella Kohler D - -  Note   Faxed ref docs to [phone] & rec'd fax confirm.           . Type Date User Summary Attachment  General 01/31/2020 3:27 PM Vella Kohler D - -  Note   Pt has been scheduled with Dr. Jacklynn Ganong w/ [facility].  Waiting to hear from TP before notifying the patient.           Type Date User Summary Attachment  General 02/11/2020 11:20 AM Harland German - -  Note   Order faxed To [facility] and message also left for them          patient is already scheduled 12/28 (port-a-cath removal)

## 2020-03-20 ENCOUNTER — Ambulatory Visit (HOSPITAL_COMMUNITY): Payer: BC Managed Care – PPO

## 2020-03-20 ENCOUNTER — Encounter (HOSPITAL_COMMUNITY): Payer: Self-pay

## 2020-03-20 ENCOUNTER — Other Ambulatory Visit: Payer: Self-pay | Admitting: Internal Medicine

## 2020-03-20 MED ORDER — AMOXICILLIN-POT CLAVULANATE 875-125 MG PO TABS: 1.0000 | ORAL_TABLET | Freq: Two times a day (BID) | ORAL | 0 refills | Status: AC

## 2020-03-20 NOTE — Addendum Note (Signed)
Addended by: Melvenia Needles on: 03/20/2020 09:28 AM   Modules accepted: Orders

## 2020-03-20 NOTE — Telephone Encounter (Signed)
Patient is going to hold off on urology for now , doing so much better after GYN ov   Complains over last 1 week with increased sinus congestion/ear fullness on left , scratchy throat mild congestion  , does not feel as well for last few days No fever.  Recent covid test neg.  O2 sats are good   Rec : Augmentin x 7 d Saline nasal rinses  mucinex As needed    Please contact office for sooner follow up if symptoms do not improve or worsen or seek emergency care

## 2020-03-25 ENCOUNTER — Encounter (HOSPITAL_COMMUNITY): Payer: Self-pay | Attending: Internal Medicine

## 2020-03-25 ENCOUNTER — Ambulatory Visit (HOSPITAL_COMMUNITY): Payer: BC Managed Care – PPO

## 2020-03-25 ENCOUNTER — Encounter (HOSPITAL_COMMUNITY): Payer: Self-pay

## 2020-03-25 DIAGNOSIS — J84112 Idiopathic pulmonary fibrosis: Secondary | ICD-10-CM | POA: Insufficient documentation

## 2020-03-26 MED FILL — ESBRIET 801 MG TABS: 801 | 30 days supply | Qty: 90 | Fill #8

## 2020-03-27 ENCOUNTER — Encounter (HOSPITAL_COMMUNITY): Payer: Self-pay

## 2020-03-27 ENCOUNTER — Ambulatory Visit (HOSPITAL_COMMUNITY): Payer: BC Managed Care – PPO

## 2020-03-31 ENCOUNTER — Other Ambulatory Visit: Payer: BC Managed Care – PPO

## 2020-03-31 ENCOUNTER — Telehealth: Payer: Self-pay | Admitting: Adult Health

## 2020-03-31 ENCOUNTER — Other Ambulatory Visit: Payer: Self-pay | Admitting: Adult Health

## 2020-03-31 MED ORDER — AZITHROMYCIN 250 MG PO TABS: ORAL_TABLET | ORAL | 0 refills | Status: AC

## 2020-03-31 MED ORDER — HYDROCODONE-HOMATROPINE 5-1.5 MG/5ML PO SYRP: 5.0000 mL | ORAL_SOLUTION | Freq: Four times a day (QID) | ORAL | 0 refills | Status: DC | PRN

## 2020-03-31 NOTE — Telephone Encounter (Signed)
Husband and son positive for Covid. She has developed cough and fatigue. Has done home test x 3 that is negative.  Fully vaccinated.  O2 sats good , no increased o2 demands.  No fever, hemoptysis.  Went to [facility] for PCR test today , results pending Advised to increased pred 25m daily for 1 week then 117m ZPack sent to pharm  If test comes back positive , will refer to MAB infusion  Sinus congestion and cough is keeping her up .  Asked should she take Ivermectin, advised to avoid this .  Needs something for cough at bedtime  Advised to use mucinex dm , tessalon ,  Will add Hydromet 1 tsp at bedtime , As needed  Cough   Will call as soon as she gets test results .   Please contact office for sooner follow up if symptoms do not improve or worsen or seek emergency care

## 2020-04-01 ENCOUNTER — Encounter (HOSPITAL_COMMUNITY): Payer: Self-pay

## 2020-04-01 ENCOUNTER — Telehealth: Payer: Self-pay | Admitting: Adult Health

## 2020-04-01 ENCOUNTER — Ambulatory Visit (HOSPITAL_COMMUNITY): Payer: BC Managed Care – PPO

## 2020-04-01 ENCOUNTER — Other Ambulatory Visit: Payer: Self-pay

## 2020-04-01 DIAGNOSIS — U071 COVID-19: Secondary | ICD-10-CM

## 2020-04-01 NOTE — Telephone Encounter (Signed)
Covid PCR test came back positive (home test x 3 neg) . Sx started 2/5. High risk on dual immunosuppression . High flow O2 .  No increased O2 demands. Sinus sx currently and flu like sx .  No fever. Appetite fair no n/v/d.  Refer to MAB infusion referral -high risk patient .  Continue current tx , supportive care  Red flag sx to monitor for .  Please contact office for sooner follow up if symptoms do not improve or worsen or seek emergency care

## 2020-04-01 NOTE — Progress Notes (Signed)
Per TP verbally- place STAT referral to [place]. Patient is high risk on high flow O2. Hx of diabetes and ILD.  Referral has been placed.

## 2020-04-02 ENCOUNTER — Telehealth: Payer: Self-pay | Admitting: Adult Health

## 2020-04-02 ENCOUNTER — Other Ambulatory Visit: Payer: Self-pay | Admitting: Adult Health

## 2020-04-02 ENCOUNTER — Encounter: Payer: Self-pay | Admitting: Adult Health

## 2020-04-02 DIAGNOSIS — U071 COVID-19: Secondary | ICD-10-CM

## 2020-04-02 NOTE — Progress Notes (Signed)
I connected by phone with Tasha Ballard on 04/02/2020 at 11:42 AM to discuss the potential use of a new treatment for mild to moderate COVID-19 viral infection in non-hospitalized patients.  This patient is a 50 y.o. female that meets the FDA criteria for Emergency Use Authorization of COVID monoclonal antibody sotrovimab.  Has a (+) direct SARS-CoV-2 viral test result  Has mild or moderate COVID-19   Is NOT hospitalized due to COVID-19  Is within 10 days of symptom onset  Has at least one of the high risk factor(s) for progression to severe COVID-19 and/or hospitalization as defined in EUA.  Specific high risk criteria : BMI > 25, Diabetes and Chronic Lung Disease   I have spoken and communicated the following to the patient or parent/caregiver regarding COVID monoclonal antibody treatment:  1. FDA has authorized the emergency use for the treatment of mild to moderate COVID-19 in adults and pediatric patients with positive results of direct SARS-CoV-2 viral testing who are 68 years of age and older weighing at least 40 kg, and who are at high risk for progressing to severe COVID-19 and/or hospitalization.  2. The significant known and potential risks and benefits of COVID monoclonal antibody, and the extent to which such potential risks and benefits are unknown.  3. Information on available alternative treatments and the risks and benefits of those alternatives, including clinical trials.  4. Patients treated with COVID monoclonal antibody should continue to self-isolate and use infection control measures (e.g., wear mask, isolate, social distance, avoid sharing personal items, clean and disinfect "high touch" surfaces, and frequent handwashing) according to CDC guidelines.   5. The patient or parent/caregiver has the option to accept or refuse COVID monoclonal antibody treatment.  After reviewing this information with the patient, the patient has agreed to receive one of the  available covid 19 monoclonal antibodies and will be provided an appropriate fact sheet prior to infusion. Scot Dock, NP 04/02/2020 11:42 AM

## 2020-04-02 NOTE — Telephone Encounter (Signed)
Called to discuss with patient about COVID-19 symptoms and the use of one of the available treatments for those with mild to moderate Covid symptoms and at a high risk of hospitalization.  Pt appears to qualify for outpatient treatment due to co-morbid conditions and/or a member of an at-risk group in accordance with the FDA Emergency Use Authorization.    Symptom onset: 03/29/2020 Immunocompromised? yes Qualifiers: immunocompromise, diabetes, bmi greater than 25  Unable to reach pt - LMOM; My chart message sent   Scot Dock

## 2020-04-03 ENCOUNTER — Ambulatory Visit (HOSPITAL_COMMUNITY): Payer: BC Managed Care – PPO

## 2020-04-03 ENCOUNTER — Encounter (HOSPITAL_COMMUNITY): Payer: Self-pay

## 2020-04-03 ENCOUNTER — Other Ambulatory Visit (HOSPITAL_COMMUNITY): Payer: Self-pay

## 2020-04-03 ENCOUNTER — Ambulatory Visit: Payer: BC Managed Care – PPO | Admitting: Internal Medicine

## 2020-04-03 ENCOUNTER — Ambulatory Visit (HOSPITAL_COMMUNITY)
Admission: RE | Admit: 2020-04-03 | Discharge: 2020-04-03 | Disposition: A | Discharge status: Home / Self Care | Payer: BC Managed Care – PPO | Source: Ambulatory Visit | Attending: Pulmonary Disease | Admitting: Pulmonary Disease

## 2020-04-03 DIAGNOSIS — J989 Respiratory disorder, unspecified: Secondary | ICD-10-CM | POA: Insufficient documentation

## 2020-04-03 DIAGNOSIS — E119 Type 2 diabetes mellitus without complications: Secondary | ICD-10-CM | POA: Diagnosis not present

## 2020-04-03 DIAGNOSIS — U071 COVID-19: Secondary | ICD-10-CM | POA: Diagnosis present

## 2020-04-03 DIAGNOSIS — Z6825 Body mass index (BMI) 25.0-25.9, adult: Secondary | ICD-10-CM | POA: Diagnosis not present

## 2020-04-03 MED ORDER — ONDANSETRON HCL 4 MG/2ML IJ SOLN
4.0000 mg | Freq: Once | INTRAMUSCULAR | Status: AC
  Administered 2020-04-03: 4 mg via INTRAVENOUS
  Filled 2020-04-03: qty 2

## 2020-04-03 MED ORDER — ALBUTEROL SULFATE HFA 108 (90 BASE) MCG/ACT IN AERS: 2.0000 | INHALATION_SPRAY | Freq: Once | RESPIRATORY_TRACT | Status: DC | PRN

## 2020-04-03 MED ORDER — FAMOTIDINE IN NACL 20-0.9 MG/50ML-% IV SOLN: 20.0000 mg | Freq: Once | INTRAVENOUS | Status: DC | PRN

## 2020-04-03 MED ORDER — METHYLPREDNISOLONE SODIUM SUCC 125 MG IJ SOLR: 125.0000 mg | Freq: Once | INTRAMUSCULAR | Status: DC | PRN

## 2020-04-03 MED ORDER — SODIUM CHLORIDE 0.9 % IV SOLN: INTRAVENOUS | Status: DC | PRN

## 2020-04-03 MED ORDER — DIPHENHYDRAMINE HCL 50 MG/ML IJ SOLN: 50.0000 mg | Freq: Once | INTRAMUSCULAR | Status: DC | PRN

## 2020-04-03 MED ORDER — SOTROVIMAB 500 MG/8ML IV SOLN
500.0000 mg | Freq: Once | INTRAVENOUS | Status: AC
  Administered 2020-04-03: 500 mg via INTRAVENOUS

## 2020-04-03 MED ORDER — EPINEPHRINE 0.3 MG/0.3ML IJ SOAJ: 0.3000 mg | Freq: Once | INTRAMUSCULAR | Status: DC | PRN

## 2020-04-03 NOTE — Discharge Instructions (Signed)
10 Things You Can Do to Manage Your COVID-19 Symptoms at Home If you have possible or confirmed COVID-19: 1. Stay home except to get medical care. 2. Monitor your symptoms carefully. If your symptoms get worse, call your healthcare provider immediately. 3. Get rest and stay hydrated. 4. If you have a medical appointment, call the healthcare provider ahead of time and tell them that you have or may have COVID-19. 5. For medical emergencies, call 911 and notify the dispatch personnel that you have or may have COVID-19. 6. Cover your cough and sneezes with a tissue or use the inside of your elbow. 7. Wash your hands often with soap and water for at least 20 seconds or clean your hands with an alcohol-based hand sanitizer that contains at least 60% alcohol. 8. As much as possible, stay in a specific room and away from other people in your home. Also, you should use a separate bathroom, if available. If you need to be around other people in or outside of the home, wear a mask. 9. Avoid sharing personal items with other people in your household, like dishes, towels, and bedding. 10. Clean all surfaces that are touched often, like counters, tabletops, and doorknobs. Use household cleaning sprays or wipes according to the label instructions. [email or web address] 09/07/2019 This information is not intended to replace advice given to you by your health care provider. Make sure you discuss any questions you have with your health care provider. Document Revised: 12/24/2019 Document Reviewed: 12/24/2019 Elsevier Patient Education  2021 [place].   If you have any questions or concerns after the infusion please call the Advanced Practice Provider on call at [phone]. This number is ONLY intended for your use regarding questions or concerns about the infusion post-treatment side-effects.  Please do not provide this number to others for use. For return to work notes please contact your primary care  provider.   If someone you know is interested in receiving treatment please have them contact their MD for a referral or visit [email or web address]   What types of side effects do monoclonal antibody drugs cause?  Common side effects  In general, the more common side effects caused by monoclonal antibody drugs include:  Allergic reactions, such as hives or itching  Flu-like signs and symptoms, including chills, fatigue, fever, and muscle aches and pains  Nausea, vomiting  Diarrhea  Skin rashes  Low blood pressure   The CDC is recommending patients who receive monoclonal antibody treatments wait at least 90 days before being vaccinated.  Currently, there are no data on the safety and efficacy of mRNA COVID-19 vaccines in persons who received monoclonal antibodies or convalescent plasma as part of COVID-19 treatment. Based on the estimated half-life of such therapies as well as evidence suggesting that reinfection is uncommon in the 90 days after initial infection, vaccination should be deferred for at least 90 days, as a precautionary measure until additional information becomes available, to avoid interference of the antibody treatment with vaccine-induced immune responses.

## 2020-04-03 NOTE — Progress Notes (Signed)
Patient reviewed Fact Sheet for Patients, Parents, and Caregivers for Emergency Use Authorization (EUA) of sotrovimab for the Treatment of Coronavirus. Patient also reviewed and is agreeable to the estimated cost of treatment. Patient is agreeable to proceed.   

## 2020-04-03 NOTE — Discharge Summary (Signed)
Diagnosis: COVID-19  Physician: Dr. Asencion Noble  Procedure: [facility] Infusion [facility] Med: Sotrovimab infusion - Provided patient with sotrovimab fact sheet for patients, parents, and caregivers prior to infusion.   Complications: No immediate complications noted. Patient c/o nausea, Zofran given. Patient feeling better, VSS, patient discharged home.   Discharge: Discharged home

## 2020-04-08 ENCOUNTER — Encounter (HOSPITAL_COMMUNITY): Payer: Self-pay

## 2020-04-08 ENCOUNTER — Ambulatory Visit (HOSPITAL_COMMUNITY): Payer: BC Managed Care – PPO

## 2020-04-10 ENCOUNTER — Encounter (HOSPITAL_COMMUNITY): Payer: Self-pay

## 2020-04-10 ENCOUNTER — Ambulatory Visit (HOSPITAL_COMMUNITY): Payer: BC Managed Care – PPO

## 2020-04-10 ENCOUNTER — Telehealth: Payer: Self-pay | Admitting: Adult Health

## 2020-04-10 MED ORDER — AZITHROMYCIN 250 MG PO TABS: ORAL_TABLET | ORAL | 0 refills | Status: DC

## 2020-04-10 NOTE — Telephone Encounter (Signed)
Patient calls in with ongoing sinus congestion . Covid 19 infection , Day 10 , has received MAB infusion . Breathing is doing okay , at baseline , no increased cough or congestion , no increased O2 demands.  No chest pain, orthopnea , calf pain or edema. No hemoptysis  Ongoing thick sinus congestion , headache. Has ongoing nausea, occasional vomiting which is common for her.  Feels she has sinus infection   Advised will send in [place] #1 tad  Increase prednisone 41m daily for 1 week then 114mdaily  Hold ofev/esbriet for 5 days , then restart.   Needs ov , says she can come in next week    Please call patient and set up ov next week   Please contact office for sooner follow up if symptoms do not improve or worsen or seek emergency care

## 2020-04-11 ENCOUNTER — Encounter: Payer: Self-pay | Admitting: Adult Health

## 2020-04-11 ENCOUNTER — Ambulatory Visit (INDEPENDENT_AMBULATORY_CARE_PROVIDER_SITE_OTHER): Payer: BC Managed Care – PPO | Admitting: Adult Health

## 2020-04-11 ENCOUNTER — Ambulatory Visit (INDEPENDENT_AMBULATORY_CARE_PROVIDER_SITE_OTHER): Payer: BC Managed Care – PPO

## 2020-04-11 VITALS — BP 124/82 | HR 95 | Temp 98.1°F | Ht 62.0 in | Wt 197.0 lb

## 2020-04-11 DIAGNOSIS — U071 COVID-19: Secondary | ICD-10-CM | POA: Insufficient documentation

## 2020-04-11 DIAGNOSIS — J9611 Chronic respiratory failure with hypoxia: Secondary | ICD-10-CM | POA: Diagnosis not present

## 2020-04-11 DIAGNOSIS — J849 Interstitial pulmonary disease, unspecified: Secondary | ICD-10-CM | POA: Diagnosis not present

## 2020-04-11 MED ORDER — PREDNISONE 10 MG PO TABS: ORAL_TABLET | ORAL | 0 refills | Status: DC

## 2020-04-11 MED ORDER — DOXYCYCLINE HYCLATE 100 MG PO TABS: 100.0000 mg | ORAL_TABLET | Freq: Two times a day (BID) | ORAL | 0 refills | Status: DC

## 2020-04-11 NOTE — Telephone Encounter (Signed)
D/w [name] PArrett

## 2020-04-11 NOTE — Assessment & Plan Note (Signed)
Progressive NSIP.  Questionable mild flare with recent COVID-19 infection We will treat with a prednisone burst.  Check chest x-ray today.  Plan  Patient Instructions  Finish Zpack as directed  Prednisone taper over next week then prednisone 66m daily  Saline nasal spray and gel As needed   Chest xray today  Continue on oxygen-goal is to keep oxygen levels greater than 88 to 90% Work on healthy weight loss.  Follow-up in 4   weeks with Dr. [facility]Chase Callerin [facility] clinic with  Please contact office for sooner follow up if symptoms do not improve or worsen or seek emergency care

## 2020-04-11 NOTE — Progress Notes (Signed)
_0  ID: Tasha Ballard, female    DOB: 10/09/1970, 50 y.o.   MRN: [id number]  Chief Complaint  Patient presents with  . Acute Visit    Referring provider: [name], MD  HPI: 50 year old female never smoker followed for very complicated medical history with interstitial lung disease-progressive NSIP and lung cancer diagnosed in 2021 Pulmonary consult 2019 to establish for interstitial lung disease.  Previously followed at [facility].  Underwent surgical lung biopsy 2013 that showed fibrotic NSIP.  Initially treated with CellCept.  Around 2016 had significant decline and was started on oxygen.  She has been evaluated at [facility] with Threasa Alpha in September 2019 felt to have progressive NSIP.  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  This led to progressive respiratory decline and increased symptom burden.  She has been treated with 1 full year of oral and IV Cytoxan from December 2019 to November 2020.  Started on Ofev in December 2019.  Started on Imuran January 2021.  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  With exercise does require higher oxygen over 10 L.  She is on chronic steroids with baseline prednisone at 10 mg daily. New diagnosis of lung cancer 2021 with a 1 cm left lower lobe nodule showing growth at May 2021 at 1.5 cm.  PET scan showed marked hypermetabolic activity with SUV at 7.7 there was no evidence of distant disease.  She was felt to high risk for biopsy.  And was for to radiation oncology and underwent SBRT with completion August 24, 2019. Port-A-Cath removed December 2021  TEST/EVENTS :  High-resolution CT chest April 27, 2019-stable UIP changes no progression since September 2020. New 1 cm left lower lobe  nodule  High-resolution CT chest Jul 19, 2019-interval growth of the left lower lobe nodule 1.5 cm. UIP changes stable  CT chest October 02, 2019 stable UIP pattern. Slight interval decrease in the left lung base nodule. Consistent with treatment response.  January 24, 2020 CT chest underlying changes of fibrotic lung disease, relatively stable groundglass attenuation in left base.  Previous left medial lung nodule measuring 1.2 cm no longer discretely visible.  Possibly secondary to increased consolidative opacity along the left lung base.  Unchanged left upper lobe 7 mm nodule.  Nodular opacity in the medial left base.  PFT January 25, 2020 FEV1 40%, ratio 70, FVC 35%, DLCO 27% PFT November 20, 2018 FEV1 40%, ratio 91, FVC 35%, DLCO 37% PFT March 30, 2018 FEV1 38%, ratio 93%, FVC 32%, DLCO 34%  04/11/2020 Follow up ; progressive NSIP.  Oxygen dependent respiratory failure, lung cancer, COVID-19 Patient presents for a follow-up visit.  Patient has recently been diagnosed with COVID-19.  She developed respiratory symptoms after home exposure from son and husband who are positive for COVID-19.  She initially did a home test x3 were negative.  And PCR test was positive on February 8.  She was referred to the monoclonal antibody infusion center.  And received monoclonal antibody infusion on February 10.  Patient says that her breathing has been okay up until the last 2 days.  She started to have more shortness of breath and cough and congestion.  All along she has had significant sinus congestion sinus pain pressure and headache.  She has had some  episodes where she has gotten sick on her stomach and vomited.  She denies any fever.,  Hemoptysis.,  Increased oxygen demands. Today in the office O2 saturations are 100% on 6 L.  This is her baseline.   Allergies  Allergen Reactions  . Macrobid [Nitrofurantoin Macrocrystal] Nausea And Vomiting  . Adhesive [Tape] Rash    Immunization History   Administered Date(s) Administered  . Fluad Quad(high Dose 65+) 12/28/2019  . Influenza,inj,Quad PF,6+ Mos 12/24/2014, 02/05/2016, 01/07/2017, 11/22/2017, 10/20/2018  . Influenza-Unspecified 12/24/2014, 02/05/2016, 01/07/2017, 11/22/2017  . PFIZER(Purple Top)SARS-COV-2 Vaccination 04/06/2019, 04/30/2019, 10/18/2019  . Pneumococcal Conjugate-13 07/20/2017  . Pneumococcal Polysaccharide-23 06/09/2012, 01/03/2019  . Tdap 02/19/2011    Past Medical History:  Diagnosis Date  . Aneurysm ([place])   . Back pain   . Cancer ([place])   . Diabetes mellitus without complication ([place])   . H/O blood clots   . Hypertension   . Joint pain   . Pulmonary fibrosis ([place])   . SOB (shortness of breath)   . Swelling   . Thyroid disease   . Vitamin D deficiency     Tobacco History: Social History   Tobacco Use  Smoking Status Never Smoker  Smokeless Tobacco Never Used   Counseling given: Not Answered   Outpatient Medications Prior to Visit  Medication Sig Dispense Refill  . azathioprine (IMURAN) 100 MG tablet Take 1 tablet (100 mg total) by mouth daily. 90 tablet 3  . azithromycin (ZITHROMAX Z-PAK) 250 MG tablet Take 2 tablets (500 mg) on  Day 1,  followed by 1 tablet (250 mg) once daily on Days 2 through 5. 6 each 0  . benzonatate (TESSALON) 200 MG capsule Take 1 capsule (200 mg total) by mouth 3 (three) times daily as needed for cough. 30 capsule 1  . carvedilol (COREG) 6.25 MG tablet Take 6.25 mg by mouth 2 (two) times daily with a meal.    . ESBRIET 801 MG TABS TAKE 1 TABLET BY MOUTH 3 (THREE) TIMES DAILY WITH MEALS. (Patient taking differently: Take 801 mg by mouth 3 (three) times daily with meals.) 90 tablet 11  . famotidine (PEPCID) 40 MG tablet TAKE 1 TABLET BY MOUTH EVERY DAY (Patient taking differently: Take 40 mg by mouth daily.) 90 tablet 3  . folic acid (FOLVITE) 1 MG tablet Take 1 tablet (1 mg total) by mouth daily. 30 tablet 5  . furosemide (LASIX) 40 MG tablet Take 40 mg by mouth See  admin instructions. Take 1 tablet (40 mg) daily, may repeat dose if needed for fluid retention.    . gabapentin (NEURONTIN) 100 MG capsule Take 1 capsule (100 mg total) by mouth at bedtime as needed (For pain). 30 capsule 5  . montelukast (SINGULAIR) 10 MG tablet TAKE 1 TABLET BY MOUTH EVERY DAY AFTER SUPPER (Patient taking differently: Take 10 mg by mouth daily after supper.) 90 tablet 1  . OFEV 150 MG CAPS TAKE 1 CAPSULE TWICE A DAY (12 HOURS APART) WITH FOOD (Patient taking differently: Take 150 mg by mouth in the morning and at bedtime.) 60 capsule 11  . ondansetron (ZOFRAN) 8 MG tablet TAKE 1 TABLET BY MOUTH EVERY 8 HOURS AS NEEDED FOR NAUSEA AND VOMITING (Patient taking differently: Take 8 mg by mouth every 8 (eight) hours as needed for nausea or vomiting.) 45 tablet 2  . potassium chloride (KLOR-CON) 10 MEQ tablet TAKE 3 TABLETS BY MOUTH DAILY 90 tablet 2  . predniSONE (DELTASONE) 10 MG tablet Take 1 tablet (10 mg total)  by mouth daily.    Marland Kitchen spironolactone (ALDACTONE) 100 MG tablet Take 100 mg by mouth daily.    Marland Kitchen sulfamethoxazole-trimethoprim (BACTRIM) 400-80 MG tablet Take 1 tablet by mouth every Monday, Wednesday, and Friday.    . thyroid (ARMOUR) 90 MG tablet Take 90 mg by mouth daily before breakfast.     . Vitamin D, Ergocalciferol, (DRISDOL) 1.25 MG (50000 UT) CAPS capsule TAKE 1 CAPSULE BY MOUTH ONCE WEEKLY (Patient taking differently: Take 50,000 Units by mouth every Saturday.) 3 capsule 0  . HYDROcodone-homatropine (HYDROMET) 5-1.5 MG/5ML syrup Take 5 mLs by mouth every 6 (six) hours as needed. 120 mL 0  . ibuprofen (ADVIL) 200 MG tablet Take 400-600 mg by mouth every 6 (six) hours as needed for headache or moderate pain.    . metFORMIN (GLUCOPHAGE) 500 MG tablet Take 500 mg by mouth 2 (two) times daily.  (Patient not taking: Reported on 03/13/2020)    . phenazopyridine (PYRIDIUM) 200 MG tablet Take 1 tablet (200 mg total) by mouth 3 (three) times daily as needed for pain. 15 tablet 1   . PROAIR HFA 108 (90 Base) MCG/ACT inhaler Inhale 2 puffs into the lungs every 6 (six) hours as needed for wheezing or shortness of breath. 18 g 5  . saccharomyces boulardii (FLORASTOR) 250 MG capsule Take 1 capsule (250 mg total) by mouth 2 (two) times daily. (Patient taking differently: Take 250 mg by mouth daily.) 60 capsule 5   No facility-administered medications prior to visit.     Review of Systems:   Constitutional:   No  weight loss, night sweats,  Fevers, chills, + fatigue, or  lassitude.  HEENT:   No headaches,  Difficulty swallowing,  Tooth/dental problems, or  Sore throat,                No sneezing, itching +++, ear ache, nasal congestion, post nasal drip,   CV:  No chest pain,  Orthopnea, PND, swelling in lower extremities, anasarca, dizziness, palpitations, syncope.   GI  No heartburn, indigestion, abdominal pain, nausea, vomiting, diarrhea, change in bowel habits, loss of appetite, bloody stools.   Resp: +shortness of breath with exertion or at rest.  No excess mucus, no productive cough,  No non-productive cough,  No coughing up of blood.  No change in color of mucus.  No wheezing.  No chest wall deformity  Skin: no rash or lesions.  GU: no dysuria, change in color of urine, no urgency or frequency.  No flank pain, no hematuria   MS:  No joint pain or swelling.  No decreased range of motion.  No back pain.    Physical Exam  BP 124/82 (BP Location: Left Arm, Cuff Size: Normal)   Pulse 95   Temp 98.1 F (36.7 C)   Ht _0  (1.575 m)   Wt 197 lb (89.4 kg)   SpO2 100%   BMI 36.03 kg/m   GEN: A/Ox3; pleasant , NAD, chronically ill-appearing female on oxygen   HEENT:  [place]/AT,  EACs-clear, TMs-wnl, NOSE-clear drainage positive maxillary sinus tenderness, THROAT-clear, no lesions, no postnasal drip or exudate noted.   NECK:  Supple w/ fair ROM; no JVD; normal carotid impulses w/o bruits; no thyromegaly or nodules palpated; no lymphadenopathy.    RESP  bibasilar crackles  no accessory muscle use, no dullness to percussion, speaks in full sentences  CARD:  RRR, no m/r/g, no peripheral edema, pulses intact, no cyanosis or clubbing.  GI:   Soft & nt; nml  bowel sounds; no organomegaly or masses detected.   Musco: Warm bil, no deformities or joint swelling noted.   Neuro: alert, no focal deficits noted.    Skin: Warm, no lesions or rashes    Lab Results:  CBC  BNP No results found for: BNP  ProBNP  Imaging: No results found.    PFT Results Latest Ref Rng & Units 01/25/2020 11/20/2018 03/30/2018  FVC-Pre L 1.15 1.16 1.09  FVC-Predicted Pre % 35 35 32  Pre FEV1/FVC % % 92 91 93  FEV1-Pre L 1.06 1.06 1.01  FEV1-Predicted Pre % 40 40 38  DLCO uncorrected ml/min/mmHg 5.44 7.29 6.85  DLCO UNC% % 27 37 34  DLCO corrected ml/min/mmHg 11.78 7.45 -  DLCO COR %Predicted % 60 37 -  DLVA Predicted % 150 93 92    No results found for: NITRICOXIDE      Assessment & Plan:   COVID-19 virus infection Patient with COVID-19 infection -initial symptom onset February 5 Patient has received monoclonal antibody infusion.  She has ongoing post COVID sinus symptoms suspicious for underlying secondary sinusitis. Now with increasing shortness of breath and cough.  We will check a chest x-ray to rule out possible underlying superimposed pneumonia.  She has no increased oxygen demands.  She is high risk for decompensation.  Plan  Patient Instructions  Finish Zpack as directed  Prednisone taper over next week then prednisone 50m daily  Saline nasal spray and gel As needed   Chest xray today  Continue on oxygen-goal is to keep oxygen levels greater than 88 to 90% Work on healthy weight loss.  Follow-up in 4   weeks with Dr. RChase Callerin [facility] with  Please contact office for sooner follow up if symptoms do not improve or worsen or seek emergency care        ILD (interstitial lung disease) ([place] Progressive NSIP.  Questionable mild  flare with recent COVID-19 infection We will treat with a prednisone burst.  Check chest x-ray today.  Plan  Patient Instructions  Finish Zpack as directed  Prednisone taper over next week then prednisone 167mdaily  Saline nasal spray and gel As needed   Chest xray today  Continue on oxygen-goal is to keep oxygen levels greater than 88 to 90% Work on healthy weight loss.  Follow-up in 4   weeks with Dr. RaChase Callern [facility] clinic with  Please contact office for sooner follow up if symptoms do not improve or worsen or seek emergency care      Chronic respiratory failure with hypoxia ([place] increased oxygen mans.  Continue on current settings.     TaRexene EdisonNP 04/11/2020

## 2020-04-11 NOTE — Patient Instructions (Addendum)
Finish Zpack as directed  Prednisone taper over next week then prednisone 60m daily  Saline nasal spray and gel As needed   Chest xray today  Continue on oxygen-goal is to keep oxygen levels greater than 88 to 90% Work on healthy weight loss.  Follow-up in 4   weeks with Dr. RChase Callerin [facility] clinic with  Please contact office for sooner follow up if symptoms do not improve or worsen or seek emergency care   Late ADD : Doxycycline 1022mTwice daily  For 7 days , stop Zpack .  Hold Imuran for 2 weeks then will need to restart back slowly

## 2020-04-11 NOTE — Assessment & Plan Note (Signed)
No increased oxygen mans.  Continue on current settings.

## 2020-04-11 NOTE — Assessment & Plan Note (Addendum)
Patient with COVID-19 infection -initial symptom onset February 5 Patient has received monoclonal antibody infusion.  She has ongoing post COVID sinus symptoms suspicious for underlying secondary sinusitis. Now with increasing shortness of breath and cough.  We will check a chest x-ray to rule out possible underlying superimposed pneumonia.  She has no increased oxygen demands.  She is high risk for decompensation.  Plan  Patient Instructions  Finish Zpack as directed  Prednisone taper over next week then prednisone 55m daily  Saline nasal spray and gel As needed   Chest xray today  Continue on oxygen-goal is to keep oxygen levels greater than 88 to 90% Work on healthy weight loss.  Follow-up in 4   weeks with Dr. RChase Callerin [facility] clinic with  Please contact office for sooner follow up if symptoms do not improve or worsen or seek emergency care      Late add , case discussed with Dr. RChase Caller, requested abx be changed to Doxycyline as continued sx on Zpack .  Please contact office for sooner follow up if symptoms do not improve or worsen or seek emergency care  Patient aware to stop Zpack and begin Doxycycline .  Will hold Imuran for next 2 weeks . Then will need to restart back slowly if improving .

## 2020-04-14 ENCOUNTER — Telehealth: Payer: Self-pay | Admitting: Adult Health

## 2020-04-14 NOTE — Telephone Encounter (Signed)
Pt has appt with MR 2/22. Nothing further needed.

## 2020-04-14 NOTE — Telephone Encounter (Signed)
Continued symptoms despite ABX and steroids .  Now with increased dyspnea and drops in Oxygen with ambulation.  At rest O2 sats 98% on baseline O2 . Drops into low 80s , quick rebound with rest .  Feels some better but now O2 sats dropping -which is new .  No hemoptysis , calf pain , swelling , chest pain, fever.   Ov tomorrow with Dr. [name] at 1145 .   Please contact office for sooner follow up if symptoms do not improve or worsen or seek emergency care

## 2020-04-15 ENCOUNTER — Other Ambulatory Visit: Payer: Self-pay

## 2020-04-15 ENCOUNTER — Encounter (HOSPITAL_COMMUNITY): Payer: Self-pay

## 2020-04-15 ENCOUNTER — Encounter: Payer: Self-pay | Admitting: Internal Medicine

## 2020-04-15 ENCOUNTER — Other Ambulatory Visit: Payer: Self-pay | Admitting: Internal Medicine

## 2020-04-15 ENCOUNTER — Ambulatory Visit (INDEPENDENT_AMBULATORY_CARE_PROVIDER_SITE_OTHER): Payer: BC Managed Care – PPO | Admitting: Internal Medicine

## 2020-04-15 ENCOUNTER — Ambulatory Visit (HOSPITAL_COMMUNITY): Payer: BC Managed Care – PPO

## 2020-04-15 VITALS — BP 114/82 | HR 89 | Temp 97.1°F | Ht 62.0 in | Wt 197.4 lb

## 2020-04-15 DIAGNOSIS — Z8616 Personal history of COVID-19: Secondary | ICD-10-CM | POA: Diagnosis not present

## 2020-04-15 DIAGNOSIS — J9611 Chronic respiratory failure with hypoxia: Secondary | ICD-10-CM | POA: Diagnosis not present

## 2020-04-15 DIAGNOSIS — J849 Interstitial pulmonary disease, unspecified: Secondary | ICD-10-CM

## 2020-04-15 DIAGNOSIS — R053 Chronic cough: Secondary | ICD-10-CM

## 2020-04-15 DIAGNOSIS — Z79899 Other long term (current) drug therapy: Secondary | ICD-10-CM | POA: Diagnosis not present

## 2020-04-15 DIAGNOSIS — U099 Post covid-19 condition, unspecified: Secondary | ICD-10-CM

## 2020-04-15 LAB — HEPATIC FUNCTION PANEL
ALT: 25 U/L (ref 0–35)
AST: 15 U/L (ref 0–37)
Albumin: 3.7 g/dL (ref 3.5–5.2)
Alkaline Phosphatase: 54 U/L (ref 39–117)
Bilirubin, Direct: 0 mg/dL (ref 0.0–0.3)
Total Bilirubin: 0.3 mg/dL (ref 0.2–1.2)
Total Protein: 6.4 g/dL (ref 6.0–8.3)

## 2020-04-15 LAB — CBC WITH DIFFERENTIAL/PLATELET
Basophils Absolute: 0 10*3/uL (ref 0.0–0.1)
Basophils Relative: 0.1 % (ref 0.0–3.0)
Eosinophils Absolute: 0 10*3/uL (ref 0.0–0.7)
Eosinophils Relative: 0 % (ref 0.0–5.0)
HCT: 37.3 % (ref 36.0–46.0)
Hemoglobin: 12.5 g/dL (ref 12.0–15.0)
Lymphocytes Relative: 7.9 % — ABNORMAL LOW (ref 12.0–46.0)
Lymphs Abs: 1 10*3/uL (ref 0.7–4.0)
MCHC: 33.4 g/dL (ref 30.0–36.0)
MCV: 92 fl (ref 78.0–100.0)
Monocytes Absolute: 0.2 10*3/uL (ref 0.1–1.0)
Monocytes Relative: 1.8 % — ABNORMAL LOW (ref 3.0–12.0)
Neutro Abs: 11.5 10*3/uL — ABNORMAL HIGH (ref 1.4–7.7)
Neutrophils Relative %: 90.2 % — ABNORMAL HIGH (ref 43.0–77.0)
Platelets: 353 10*3/uL (ref 150.0–400.0)
RBC: 4.06 Mil/uL (ref 3.87–5.11)
RDW: 15.4 % (ref 11.5–15.5)
WBC: 12.8 10*3/uL — ABNORMAL HIGH (ref 4.0–10.5)

## 2020-04-15 LAB — MAGNESIUM: Magnesium: 1.6 mg/dL (ref 1.5–2.5)

## 2020-04-15 LAB — C-REACTIVE PROTEIN: CRP: <1 mg/dL (ref 0.5–20.0)

## 2020-04-15 LAB — PHOSPHORUS: Phosphorus: 4.3 mg/dL (ref 2.3–4.6)

## 2020-04-15 NOTE — Progress Notes (Signed)
OV 07/27/2019 -   Subjective:  Patient ID: Tasha Ballard, female , DOB: [birth year] , age 50 y.o. , MRN: [id number] , ADDRESS: [address] [place] [address]   07/27/2019 - telephone visit. Patient identifed with 2PHI   HPI Tasha Ballard 50 y.o. - to discuss below results. rEsults given. She is very anxious and sad about the results     NM PET Image Initial (PI) Skull Base To Thigh  Result Date: 07/27/2019 CLINICAL DATA:  Initial treatment strategy for left lower lobe nodule. EXAM: NUCLEAR MEDICINE PET SKULL BASE TO THIGH TECHNIQUE: 11.7 mCi F-18 FDG was injected intravenously. Full-ring PET imaging was performed from the skull base to thigh after the radiotracer. CT data was obtained and used for attenuation correction and anatomic localization. Fasting blood glucose: 116 mg/dl COMPARISON:  CT chest dated 07/19/2019 FINDINGS: Mediastinal blood pool activity: SUV max 4.2 Liver activity: SUV max NA NECK: No hypermetabolic cervical lymphadenopathy. Incidental CT findings: none CHEST: 15 mm nodule in the medial left lower lobe (series 3/image 118), max SUV 7.7, compatible primary bronchogenic neoplasm. 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 No hypermetabolic thoracic lymphadenopathy. Incidental CT findings: Right chest port terminates the cavoatrial junction. Mild atherosclerotic calcifications of the aortic arch. ABDOMEN/PELVIS: No abnormal hypermetabolism in the liver, spleen, pancreas, or adrenal glands. No hypermetabolic abdominopelvic lymphadenopathy. Incidental CT findings: GDA embolization coil. SMA stent. Mild atherosclerotic calcifications of the abdominal aorta and branch vessels. SKELETON: No focal hypermetabolic activity to suggest skeletal metastasis. Incidental CT findings: Degenerative changes of the visualized thoracolumbar spine. IMPRESSION: 15 mm nodule in the medial left lower lobe, compatible  primary bronchogenic neoplasm. No findings suspicious for metastatic disease. Electronically Signed   By: Tasha Ballard M.D.   On: 07/27/2019 10:06    OV 09/11/2019   Subjective:  Patient ID: Tasha Ballard, female , DOB: Apr 30, 1970, age 50 y.o. years. , MRN: [id number],  ADDRESS: [address] [place] [address] PCP  Tasha Maid, MD Providers : Treatment Team:  Attending Provider: Brand Males, MD   Chief Complaint  Patient presents with  . Follow-up    medication question    #Morbid Obesity - needs weight loss for transplant - sees Dr Tasha Ballard since dec 2019  - Lung transplant needed: Obesity Barrier :  Weight 231#  11/22/17  -> 221# 01/17/2018., -> 222# on 03/02/2018 -> 220# on 03/30/2018 -> 223# on 04/26/2018 -> 224# on 10/20/2018 -> 219# on 01/03/2019 -> 222#    #PRogressive NSIP #Chronic hypoxemic resp failure - 4L at rest, 8L with exertion in dec 2019 - dec 2020 6 to 8/12L # High risk treatment regimen  -CellCept 2013 through 2019  -Chronic prednisone 2013 -currently taking -Bactrim prophylaxis 2013 --currently taking  - Cytoxan Oral  78m per day 12/23/17 and increaed to 105mon 12/23/17.On11/19/19. increase to 12520mer day   - Cytoxan IV : And switched to IV cytoxan Q4 week cycle   - 1st cycle 02/03/18 (s/p portocath 01/26/18)  = 2nd cycle 03/06/2018  - 3rd cycle  -  04/03/2018  - 4th cycle - 05/01/2018 - Cytoxan oral (due to pandemic covid-19 and need to avoid infusion visits) - spring 2020 - BAck on IV Cytoxan -   - 5th cycle - end sept 2020  - 6th cycle - end oct 2020  - 7th  Cycle - end nov 2020 (full dose) 01/17/2019 -. Stopped -  Immuran  0 approx jan 2021  - Rehab + spring 2021 onwauys    - Tasha Ballard Gave 01/23/18 (approximnate) - Esbriet -early-mid April 2021  #Nodule of lower lobe of left lung - new march 2021  - biodesk negative  - enarlged to  1.5cm may 20201 - PET HOT - empiric XRT    HPI Tasha Ballard 50 y.o. -returns for follow-up  with her husband Tasha Ballard.  She is now on Imuran twice daily.  She is also taking nintedanib and full dose Esbriet for her ILD.  She feels stable.  At rest she is between 4 and 6 L.  When she works out she uses 8-12 L.  This is stable.  She has lost 15 pounds of weight.  For lung nodule she is finished radiation ending early July 2021.  She did have some side effects in the throat because of this.  She also has altered taste which she believes could be because of the Esbriet and Metformin.  Nevertheless overall she is tolerating the drugs fine.  She is happy about her weight loss.  She and her husband wanted know if hyperbaric oxygen will help her.  I have reached out to some colleagues through the pulmonary fibrosis foundation to figure this out.  She is agreeable to removing her central line Port-A-Cath which is currently not being used.  She also indicated to me that when she took probiotic along with antibiotics recently this helped reset her diarrhea.  She wants to know if she should take probiotics on a regular basis.  Discussed with Dr. Collene Mares about her personal professional opinion about this.  Echo March 2021: Normal RV function.  Grade 1 diastolic dysfunction.  PA pressures not calculated.   ROS - per HPI   OV 04/15/2020  Subjective:  Patient ID: Tasha Ballard, female , DOB: Jan 28, 1971 , age 50 y.o. , MRN: [id number] , ADDRESS: [place] [address] PCP Tasha Maid, MD Patient Care Team: Tasha Maid, MD as PCP - General (Family Medicine)  This Provider for this visit: Treatment Team:  Attending Provider: Brand Males, MD    04/15/2020 -   Chief Complaint  Patient presents with  . Follow-up    Drop in stats with exertion, constant cough    #Morbid Obesity - needs weight loss for transplant - sees Dr Tasha Ballard since dec 2019  - Lung transplant needed: Obesity Barrier :  Weight 231#  11/22/17  -> 221# 01/17/2018., -> 222# on 03/02/2018 -> 220# on  03/30/2018 -> 223# on 04/26/2018 -> 224# on 10/20/2018 -> 219# on 01/03/2019 -> 222#    #PRogressive NSIP #Chronic hypoxemic resp failure - 4L at rest, 8L with exertion in dec 2019 - dec 2020 6 to 8/12L # High risk treatment regimen  -CellCept 2013 through 2019  -Chronic prednisone 2013 -currently taking -Bactrim prophylaxis 2013 --currently taking  - Cytoxan Oral  23m per day 12/23/17 and increaed to 1045mon 12/23/17.On11/19/19. increase to 12561mer day   - Cytoxan IV : And switched to IV cytoxan Q4 week cycle   - 1st cycle 02/03/18 (s/p portocath 01/26/18)  = 2nd cycle 03/06/2018  - 3rd cycle  -  04/03/2018  - 4th cycle - 05/01/2018 - Cytoxan oral (due to pandemic covid-19 and need to avoid infusion visits) - spring 2020 - BAck on IV Cytoxan -   - 5th cycle - end sept 2020  - 6th cycle - end oct 2020  - 7th  Cycle -  end nov 2020 (full dose) 01/17/2019 -. Stopped - Immuran  - approx jan 2021  - Rehab + spring 2021 onwauys    - Ofev 01/23/18 (approximnate) - Esbriet -early-mid April 2021  #Nodule of lower lobe of left lung - new march 2021  - biodesk negative  - enarlged to  1.5cm may 20201 - PET HOT - empiric XRT  #Positive history of COVID-19 O micron 04/03/2020  -  Sotrovimab infusion 04/03/20   HPI Tasha Ballard 50 y.o. -presents for follow-up.  She is on dual antifibrotic therapy along with Imuran for her NSIP and chronic hypoxemic respiratory failure.  On 04/03/2020 following her outbreak within her family she ended up getting COVID-19 omicron infection.  She previously tested negative with an rapid antigen testing but given high suspicion Patricia Nettle our nurse practitioner tested PCR and was positive.  She then underwent monoclonal antibody neutralizing infusion 04/03/2020.  A week after that on 04/11/2020 she presented with to nurse practitioner because she was still feeling significantly symptomatic and miserable with worsening cough.  She had a chest x-ray that showed  showed fibrotic changes and was noncontributory.  I reviewed the records for this.  I visualized the chest x-ray.  Nurse practitioner asked the patient to take Z-Pak order completed.  Also give prednisone taper over the following week which is this week.  Give saline nasal spray.  Patient is here as a follow-up from the 04/11/2020 visit.  Overall her symptom profile is significantly deteriorated.  She tells me she is requiring 10-12 L of oxygen and even with that she is desaturating.  Normally even at 6 or 8 L she can go to the bathroom and not have a problem.  Currently with that level of oxygen she is desaturating into the 70s.  She is not on any anticoagulations denies any edema.  She has had some panic attacks where she got significantly short of breath.  This has been scary for her.  Currently nintedanib, pirfenidone and her Imuran are on hold for the last 3 to 4 days.  She is also coughing way more than usual.  She is asking for palliative relief of cough.  Last right heart catheterization was a few years ago at [facility] and she did not have pulmonary hypertension.   Of note she was going to participate in a pulse inhaled nitric oxide study and this is currently on hold.  SYMPTOM SCALE - ILD 03/30/2018  04/26/2018  10/20/2018  01/03/2019  02/08/2019  04/15/2020   O2 use  4 L at rest, 8 L with exertion Same but prefers 6L at all times 6L  6L at rest, 8-10 L with exertion 6L rest -> 93% Was on 8-10L with exertion. Now needing 12l  + with exertion and is post covid  Shortness of Breath 0 -> 5 scale with 5 being worst (score 6 If unable to do)       At rest 0 1 0 0 0 2  Simple tasks - showers, clothes change, eating, shaving 1 3.5 3.5 3._0 Household (dishes, doing bed, laundry) 2.5 4.5 3._1 Shopping 3 4.5 3._2 Walking level at own pace 3 3.5 3.5 3._3 Walking up Stairs 4 5 4._4 Total  Dyspnea Score 13.5 22 19._5 How bad is your cough? x 2.5 x mild worse 4   How bad is your fatigue 5 ("  very, lol" 4 Extreme 5+ "lot" awful 4  nausea      2  vomit      2  dirahhea      0  anxiety      Just with panic  depression      0      PFT  PFT Results Latest Ref Rng & Units 01/25/2020 11/20/2018 03/30/2018  FVC-Pre L 1.15 1.16 1.09  FVC-Predicted Pre % 35 35 32  Pre FEV1/FVC % % 92 91 93  FEV1-Pre L 1.06 1.06 1.01  FEV1-Predicted Pre % 40 40 38  DLCO uncorrected ml/min/mmHg 5.44 7.29 6.85  DLCO UNC% % 27 37 34  DLCO corrected ml/min/mmHg 11.78 7.45 -  DLCO COR %Predicted % 60 37 -  DLVA Predicted % 150 93 92       has a past medical history of Aneurysm ([place]), Back pain, Cancer ([place]), Diabetes mellitus without complication ([place]), H/O blood clots, Hypertension, Joint pain, Pulmonary fibrosis (HCC), SOB (shortness of breath), Swelling, Thyroid disease, and Vitamin D deficiency.   reports that she has never smoked. She has never used smokeless tobacco.  Past Surgical History:  Procedure Laterality Date  . ABLATION    . IR IMAGING GUIDED PORT INSERTION  01/26/2018  . IR REMOVAL TUN ACCESS W/ PORT W/O FL MOD SED  02/19/2020  . KNEE SURGERY    . LUNG BIOPSY      Allergies  Allergen Reactions  . Macrobid [Nitrofurantoin Macrocrystal] Nausea And Vomiting  . Adhesive [Tape] Rash    Immunization History  Administered Date(s) Administered  . Fluad Quad(high Dose 65+) 12/28/2019  . Influenza,inj,Quad PF,6+ Mos 12/24/2014, 02/05/2016, 01/07/2017, 11/22/2017, 10/20/2018  . Influenza-Unspecified 12/24/2014, 02/05/2016, 01/07/2017, 11/22/2017  . PFIZER(Purple Top)SARS-COV-2 Vaccination 04/06/2019, 04/30/2019, 10/18/2019  . Pneumococcal Conjugate-13 07/20/2017  . Pneumococcal Polysaccharide-23 06/09/2012, 01/03/2019  . Tdap 02/19/2011    Family History  Problem Relation Age of Onset  . Heart failure Mother   . Heart disease Mother   . Rheumatologic disease Father      Current Outpatient Medications:  .  azathioprine (IMURAN) 100 MG tablet,  Take 1 tablet (100 mg total) by mouth daily., Disp: 90 tablet, Rfl: 3 .  benzonatate (TESSALON) 200 MG capsule, Take 1 capsule (200 mg total) by mouth 3 (three) times daily as needed for cough., Disp: 30 capsule, Rfl: 1 .  carvedilol (COREG) 6.25 MG tablet, Take 6.25 mg by mouth 2 (two) times daily with a meal., Disp: , Rfl:  .  doxycycline (VIBRA-TABS) 100 MG tablet, Take 1 tablet (100 mg total) by mouth 2 (two) times daily., Disp: 14 tablet, Rfl: 0 .  ESBRIET 801 MG TABS, TAKE 1 TABLET BY MOUTH 3 (THREE) TIMES DAILY WITH MEALS. (Patient taking differently: Take 801 mg by mouth 3 (three) times daily with meals.), Disp: 90 tablet, Rfl: 11 .  famotidine (PEPCID) 40 MG tablet, TAKE 1 TABLET BY MOUTH EVERY DAY (Patient taking differently: Take 40 mg by mouth daily.), Disp: 90 tablet, Rfl: 3 .  folic acid (FOLVITE) 1 MG tablet, Take 1 tablet (1 mg total) by mouth daily., Disp: 30 tablet, Rfl: 5 .  furosemide (LASIX) 40 MG tablet, Take 40 mg by mouth See admin instructions. Take 1 tablet (40 mg) daily, may repeat dose if needed for fluid retention., Disp: , Rfl:  .  gabapentin (NEURONTIN) 100 MG capsule, Take 1 capsule (100 mg total) by mouth at bedtime as needed (For pain)., Disp: 30 capsule, Rfl: 5 .  montelukast (  SINGULAIR) 10 MG tablet, TAKE 1 TABLET BY MOUTH EVERY DAY AFTER SUPPER (Patient taking differently: Take 10 mg by mouth daily after supper.), Disp: 90 tablet, Rfl: 1 .  OFEV 150 MG CAPS, TAKE 1 CAPSULE TWICE A DAY (12 HOURS APART) WITH FOOD (Patient taking differently: Take 150 mg by mouth in the morning and at bedtime.), Disp: 60 capsule, Rfl: 11 .  ondansetron (ZOFRAN) 8 MG tablet, TAKE 1 TABLET BY MOUTH EVERY 8 HOURS AS NEEDED FOR NAUSEA AND VOMITING (Patient taking differently: Take 8 mg by mouth every 8 (eight) hours as needed for nausea or vomiting.), Disp: 45 tablet, Rfl: 2 .  potassium chloride (KLOR-CON) 10 MEQ tablet, TAKE 3 TABLETS BY MOUTH DAILY, Disp: 90 tablet, Rfl: 2 .  predniSONE  (DELTASONE) 10 MG tablet, Take 1 tablet (10 mg total) by mouth daily., Disp: , Rfl:  .  predniSONE (DELTASONE) 10 MG tablet, 4 tabs for 4 days, then 3 tabs for 4 days, 2 tabs for 4 days, then 1 tab daily, Disp: 40 tablet, Rfl: 0 .  spironolactone (ALDACTONE) 100 MG tablet, Take 100 mg by mouth daily., Disp: , Rfl:  .  sulfamethoxazole-trimethoprim (BACTRIM) 400-80 MG tablet, Take 1 tablet by mouth every Monday, Wednesday, and Friday., Disp: , Rfl:  .  thyroid (ARMOUR) 90 MG tablet, Take 90 mg by mouth daily before breakfast. , Disp: , Rfl:  .  Vitamin D, Ergocalciferol, (DRISDOL) 1.25 MG (50000 UT) CAPS capsule, TAKE 1 CAPSULE BY MOUTH ONCE WEEKLY (Patient taking differently: Take 50,000 Units by mouth every Saturday.), Disp: 3 capsule, Rfl: 0      Objective:   Vitals:   04/15/20 1204  BP: 114/82  Pulse: 89  Temp: (!) 97.1 F (36.2 C)  TempSrc: Oral  SpO2: 99%  Weight: 197 lb 6.4 oz (89.5 kg)  Height: _0  (1.575 m)    Estimated body mass index is 36.1 kg/m as calculated from the following:   Height as of this encounter: _1  (1.575 m).   Weight as of this encounter: 197 lb 6.4 oz (89.5 kg).  _2 @  Filed Weights   04/15/20 1204  Weight: 197 lb 6.4 oz (89.5 kg)     Physical Exam General: No distress.  Labored talking Neuro: Alert and Oriented x 3. GCS 15. Speech normal Psych: Pleasant Resp:  Barrel Chest - no.  Wheeze - no, Crackles -yes with oxygen on, No overt respiratory distress CVS: Normal heart sounds. Murmurs - no Ext: Stigmata of Connective Tissue Disease - no HEENT: Normal upper airway. PEERL +. No post nasal drip        Assessment:       ICD-10-CM   1. ILD (interstitial lung disease) (HCC)  J84.9 D-Dimer, Quantitative    Lactate Dehydrogenase (LDH)    C-reactive protein    Magnesium    Phosphorus  2. High risk medication use  Z79.899 CBC w/Diff    Hepatic function panel  3. Chronic respiratory failure with hypoxia (HCC)  J96.11   4.  Personal history of COVID-19  Z86.16   5. 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  She requires highly intensive monitoring.  She is on high risk medication use.  I worry about ILD flareup.  Pulmonary embolism might be a possibility.  Will order some blood work.  Based on that she may need a CT angiogram chest.  Especially the D-dimer is high.  She might also need Doppler lower extremities.  She might need extension of the prednisone taper.  She requires intensive monitoring for this.  We will refer her to Dr. Wynelle Fanny for right heart catheterization.  We discussed about inhaled treprostinil.     Plan:     Patient Instructions     ICD-10-CM   1. ILD (interstitial lung disease) (HCC)  J84.9 D-Dimer, Quantitative    Lactate Dehydrogenase (LDH)    C-reactive protein    Magnesium    Phosphorus  2. High risk medication use  Z79.899 CBC w/Diff    Hepatic function panel  3. Chronic respiratory failure with hypoxia (HCC)  J96.11   4. Personal history of COVID-19  Z86.16   5. 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    U09.9     1 concern is that your COVID long-haul Another concern is that pulmonary fibrosis might of flared up because of COVID-19 Need to rule out blood clot Also pulmonary hypertension might of statin Regardless overall oxygen needs are worse now needing at least 12 L  Plan -Continue oxygen with goal pulse ox greater than 88% -Restart Esbriet and Ofev -Can restart Imuran in 1-2 weeks but depending on the blood test results below  -Complete the prednisone taper given by Patricia Nettle -if needed we will extend this but this depends on blood test results -Check blood CBC, chemistry, liver function test, D-dimer, LDH CRP, magnesium and phosphorus   -We will call with results -Refer to Dr. Glori Bickers or Dr. Loralie Champagne for right heart catheterization evaluation. -Start Hycodan  cough syrup 5 mL twice daily for palliative relief of cough  Follow-up -Return to see [name] ferritin a 30-minute visit in 2-3 weeks -Return to see Dr. [name] in a 30-minute visit in April 2022 [schedule is being opened up for April]   ( Level 05 visit: Estb 40-54 min   in  visit type: on-site physical face to visit  in total care time and counseling or/and coordination of care by this undersigned MD - Dr Tasha Ballard. This includes one or more of the following on this same day 04/15/2020: pre-charting, chart review, note writing, documentation discussion of test results, diagnostic or treatment recommendations, prognosis, risks and benefits of management options, instructions, education, compliance or risk-factor reduction. It excludes time spent by the [place] or office staff in the care of the patient. Actual time 40 min)   SIGNATURE    Dr. Brand Ballard, M.D., F.C.C.P,  Pulmonary and Critical Care Medicine Staff Physician, [place] Director - Interstitial Lung Disease  Program  Pulmonary [place] [facility] [place], [place], [address]  Pager: [phone], If no answer or between  15:00h - 7:00h: call [phone] Telephone: [phone]  12:56 PM 04/15/2020

## 2020-04-15 NOTE — Addendum Note (Signed)
Addended by: Coralie Keens on: 04/15/2020 01:51 PM   Modules accepted: Orders

## 2020-04-15 NOTE — Addendum Note (Signed)
Addended by: Suzzanne Cloud E on: 04/15/2020 03:05 PM   Modules accepted: Orders

## 2020-04-15 NOTE — Patient Instructions (Addendum)
ICD-10-CM   1. ILD (interstitial lung disease) (HCC)  J84.9 D-Dimer, Quantitative    Lactate Dehydrogenase (LDH)    C-reactive protein    Magnesium    Phosphorus  2. High risk medication use  Z79.899 CBC w/Diff    Hepatic function panel  3. Chronic respiratory failure with hypoxia (HCC)  J96.11   4. Personal history of COVID-19  Z86.16   5. 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    U09.9     1 concern is that your COVID long-haul Another concern is that pulmonary fibrosis might of flared up because of COVID-19 Need to rule out blood clot Also pulmonary hypertension might of statin Regardless overall oxygen needs are worse now needing at least 12 L  Plan -Continue oxygen with goal pulse ox greater than 88% -Restart Esbriet and Ofev -Can restart Imuran in 1-2 weeks but depending on the blood test results below  -Complete the prednisone taper given by Patricia Nettle -if needed we will extend this but this depends on blood test results -Check blood CBC, chemistry, liver function test, D-dimer, LDH CRP, magnesium and phosphorus   -We will call with results -Refer to Dr. Glori Bickers or Dr. Loralie Champagne for right heart catheterization evaluation. -Start Hycodan cough syrup 5 mL twice daily for palliative relief of cough  Follow-up -Return to see [name] ferritin a 30-minute visit in 2-3 weeks -Return to see Dr. [name] in a 30-minute visit in April 2022 [schedule is being opened up for April]

## 2020-04-15 NOTE — Addendum Note (Signed)
Addended by: Valerie Salts on: 04/15/2020 04:06 PM   Modules accepted: Orders

## 2020-04-16 ENCOUNTER — Telehealth: Payer: Self-pay | Admitting: Internal Medicine

## 2020-04-16 ENCOUNTER — Other Ambulatory Visit: Payer: Self-pay | Admitting: Internal Medicine

## 2020-04-16 LAB — LACTATE DEHYDROGENASE: LDH: 210 U/L (ref 120–250)

## 2020-04-16 LAB — D-DIMER, QUANTITATIVE: D-Dimer, Quant: 0.34 mcg/mL FEU (ref <0.50)

## 2020-04-16 MED ORDER — HYDROCODONE-HOMATROPINE 5-1.5 MG/5ML PO SYRP
5.0000 mL | ORAL_SOLUTION | Freq: Four times a day (QID) | ORAL | 0 refills | Status: DC | PRN
Start: 2020-04-16 — End: 2020-05-14

## 2020-04-16 MED ORDER — HYDROCOD POLST-CPM POLST ER 10-8 MG/5ML PO SUER: 5.0000 mL | Freq: Two times a day (BID) | ORAL | 0 refills | Status: DC

## 2020-04-16 NOTE — Telephone Encounter (Signed)
Yes please change this to Tussionex 5 mL twice daily. -Please send prescription second of fingerprint signature Also please let her know that she can restart her Imuran

## 2020-04-16 NOTE — Telephone Encounter (Signed)
Spoke with patient. She is aware that the RX for the Tussionex has been sent in.   Nothing further needed at time of call.

## 2020-04-16 NOTE — Telephone Encounter (Signed)
RX has been pended.

## 2020-04-16 NOTE — Telephone Encounter (Signed)
Note from pt's pharmacy: Changes Requested   Name from pharmacy: HYDROCODONE-HOMATROPINE 5-1.5 MG/5ML SYRUP ML       Will file in chart as: HYDROcodone-homatropine (HYCODAN) 5-1.5 MG/5ML syrup   Sig: ** SEE NOTE ABOVE **   Disp:  0 mL  Refills:  0   Start: 04/16/2020   Earliest Fill Date: 04/16/2020   Class: Normal   Non-formulary   Last ordered: Today by Brand Males, MD    Rx #: [phone]   Pharmacy comment: Arlana Hove, TRY SOMETHING ELSE ????     To be filled at: [facility],  - [address]       MR, please advise on different med.

## 2020-04-16 NOTE — Telephone Encounter (Signed)
Spoke with patient. She stated that she went to the pharmacy to get her cough syrup but was told the pharmacy is out of Hycodan but has Tussionex on hand. She wishes to have this called into the pharmacy instead.   Pharmacy is [facility].   MR, please advise.

## 2020-04-16 NOTE — Telephone Encounter (Signed)
done

## 2020-04-17 ENCOUNTER — Encounter (HOSPITAL_COMMUNITY): Payer: Self-pay

## 2020-04-17 ENCOUNTER — Ambulatory Visit (HOSPITAL_COMMUNITY): Payer: BC Managed Care – PPO

## 2020-04-22 ENCOUNTER — Encounter (HOSPITAL_COMMUNITY): Payer: Self-pay | Attending: Internal Medicine

## 2020-04-22 ENCOUNTER — Ambulatory Visit (HOSPITAL_COMMUNITY): Payer: BC Managed Care – PPO

## 2020-04-22 ENCOUNTER — Encounter (HOSPITAL_COMMUNITY): Payer: Self-pay

## 2020-04-22 DIAGNOSIS — J84112 Idiopathic pulmonary fibrosis: Secondary | ICD-10-CM | POA: Insufficient documentation

## 2020-04-24 ENCOUNTER — Ambulatory Visit (HOSPITAL_COMMUNITY): Payer: BC Managed Care – PPO

## 2020-04-24 ENCOUNTER — Encounter (HOSPITAL_COMMUNITY): Payer: Self-pay

## 2020-04-25 MED FILL — ESBRIET 801 MG TABS: 801 | 30 days supply | Qty: 90 | Fill #9

## 2020-04-25 NOTE — Telephone Encounter (Signed)
[name], Please see comment from patient and advise.  Thank you.

## 2020-04-25 NOTE — Telephone Encounter (Signed)
Please call cardiology referral was placed on the 22nd to see what the delay is  Spoke to patient patient will talk to primary care regarding her Metformin.

## 2020-04-29 ENCOUNTER — Encounter (HOSPITAL_COMMUNITY): Payer: Self-pay

## 2020-04-29 ENCOUNTER — Ambulatory Visit (HOSPITAL_COMMUNITY): Payer: BC Managed Care – PPO

## 2020-05-01 ENCOUNTER — Ambulatory Visit (HOSPITAL_COMMUNITY): Payer: BC Managed Care – PPO

## 2020-05-01 ENCOUNTER — Encounter (HOSPITAL_COMMUNITY): Payer: Self-pay

## 2020-05-06 ENCOUNTER — Ambulatory Visit (HOSPITAL_COMMUNITY): Payer: BC Managed Care – PPO

## 2020-05-06 ENCOUNTER — Encounter (HOSPITAL_COMMUNITY): Payer: Self-pay

## 2020-05-08 ENCOUNTER — Encounter (HOSPITAL_COMMUNITY): Payer: Self-pay

## 2020-05-08 ENCOUNTER — Ambulatory Visit (HOSPITAL_COMMUNITY): Payer: BC Managed Care – PPO

## 2020-05-13 ENCOUNTER — Encounter (HOSPITAL_COMMUNITY): Payer: Self-pay

## 2020-05-13 ENCOUNTER — Ambulatory Visit (HOSPITAL_COMMUNITY): Payer: BC Managed Care – PPO

## 2020-05-14 ENCOUNTER — Other Ambulatory Visit: Payer: Self-pay

## 2020-05-14 ENCOUNTER — Encounter: Payer: Self-pay | Admitting: Internal Medicine

## 2020-05-14 ENCOUNTER — Telehealth: Payer: Self-pay | Admitting: Internal Medicine

## 2020-05-14 ENCOUNTER — Ambulatory Visit (INDEPENDENT_AMBULATORY_CARE_PROVIDER_SITE_OTHER): Payer: BC Managed Care – PPO | Admitting: Internal Medicine

## 2020-05-14 VITALS — BP 128/90 | HR 111 | Temp 97.2°F | Ht 62.0 in | Wt 197.0 lb

## 2020-05-14 DIAGNOSIS — D849 Immunodeficiency, unspecified: Secondary | ICD-10-CM | POA: Diagnosis not present

## 2020-05-14 DIAGNOSIS — J849 Interstitial pulmonary disease, unspecified: Secondary | ICD-10-CM | POA: Diagnosis not present

## 2020-05-14 DIAGNOSIS — Z5181 Encounter for therapeutic drug level monitoring: Secondary | ICD-10-CM | POA: Diagnosis not present

## 2020-05-14 DIAGNOSIS — R000 Tachycardia, unspecified: Secondary | ICD-10-CM

## 2020-05-14 DIAGNOSIS — J9611 Chronic respiratory failure with hypoxia: Secondary | ICD-10-CM | POA: Diagnosis not present

## 2020-05-14 MED ORDER — METOPROLOL TARTRATE 25 MG PO TABS: 25.0000 mg | ORAL_TABLET | ORAL | 3 refills | Status: DC | PRN

## 2020-05-14 NOTE — Addendum Note (Signed)
Addended by: Lorretta Harp on: 05/14/2020 10:54 AM   Modules accepted: Orders

## 2020-05-14 NOTE — Progress Notes (Signed)
OV 07/27/2019 -   Subjective:  Patient ID: Tasha Ballard, female , DOB: 1970/12/23 , age 50 y.o. , MRN: [id number] , ADDRESS: [address] [place] [address]   07/27/2019 - telephone visit. Patient identifed with 2PHI   HPI Tasha Ballard 50 y.o. - to discuss below results. rEsults given. She is very anxious and sad about the results     NM PET Image Initial (PI) Skull Base To Thigh  Result Date: 07/27/2019 CLINICAL DATA:  Initial treatment strategy for left lower lobe nodule. EXAM: NUCLEAR MEDICINE PET SKULL BASE TO THIGH TECHNIQUE: 11.7 mCi F-18 FDG was injected intravenously. Full-ring PET imaging was performed from the skull base to thigh after the radiotracer. CT data was obtained and used for attenuation correction and anatomic localization. Fasting blood glucose: 116 mg/dl COMPARISON:  CT chest dated 07/19/2019 FINDINGS: Mediastinal blood pool activity: SUV max 4.2 Liver activity: SUV max NA NECK: No hypermetabolic cervical lymphadenopathy. Incidental CT findings: none CHEST: 15 mm nodule in the medial left lower lobe (series 3/image 118), max SUV 7.7, compatible primary bronchogenic neoplasm. 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 No hypermetabolic thoracic lymphadenopathy. Incidental CT findings: Right chest port terminates the cavoatrial junction. Mild atherosclerotic calcifications of the aortic arch. ABDOMEN/PELVIS: No abnormal hypermetabolism in the liver, spleen, pancreas, or adrenal glands. No hypermetabolic abdominopelvic lymphadenopathy. Incidental CT findings: GDA embolization coil. SMA stent. Mild atherosclerotic calcifications of the abdominal aorta and branch vessels. SKELETON: No focal hypermetabolic activity to suggest skeletal metastasis. Incidental CT findings: Degenerative changes of the visualized thoracolumbar spine. IMPRESSION: 15 mm nodule in the medial left lower lobe, compatible primary  bronchogenic neoplasm. No findings suspicious for metastatic disease. Electronically Signed   By: Julian Hy M.D.   On: 07/27/2019 10:06    OV 09/11/2019   Subjective:  Patient ID: Tasha Ballard, female , DOB: 1970-09-07, age 71 y.o. years. , MRN: [id number],  ADDRESS: [address] [place] [address] PCP  Tasha Maid, MD Providers : Treatment Team:  Attending Provider: Brand Males, MD   Chief Complaint  Patient presents with  . Follow-up    medication question    #Morbid Obesity - needs weight loss for transplant - sees Dr Janeal Holmes since dec 2019  - Lung transplant needed: Obesity Barrier :  Weight 231#  11/22/17  -> 221# 01/17/2018., -> 222# on 03/02/2018 -> 220# on 03/30/2018 -> 223# on 04/26/2018 -> 224# on 10/20/2018 -> 219# on 01/03/2019 -> 222#    #PRogressive NSIP #Chronic hypoxemic resp failure - 4L at rest, 8L with exertion in dec 2019 - dec 2020 6 to 8/12L # High risk treatment regimen  -CellCept 2013 through 2019  -Chronic prednisone 2013 -currently taking -Bactrim prophylaxis 2013 --currently taking  - Cytoxan Oral  30m per day 12/23/17 and increaed to 1061mon 12/23/17.On11/19/19. increase to 1258mer day   - Cytoxan IV : And switched to IV cytoxan Q4 week cycle   - 1st cycle 02/03/18 (s/p portocath 01/26/18)  = 2nd cycle 03/06/2018  - 3rd cycle  -  04/03/2018  - 4th cycle - 05/01/2018 - Cytoxan oral (due to pandemic covid-19 and need to avoid infusion visits) - spring 2020 - BAck on IV Cytoxan -   - 5th cycle - end sept 2020  - 6th cycle - end oct 2020  - 7th  Cycle - end nov 2020 (full dose) 01/17/2019 -. Stopped - Immuran  0 approx jan 2021  - Rehab + spring 2021 onwauys    - Dickey Gave 01/23/18 (approximnate) - Esbriet -early-mid April 2021  #Nodule of lower lobe of left lung - new march 2021  - biodesk negative  - enarlged to  1.5cm may 20201 - PET HOT - empiric XRT    HPI Tasha Ballard 50 y.o. -returns for follow-up with her  husband Tasha Ballard.  She is now on Imuran twice daily.  She is also taking nintedanib and full dose Esbriet for her ILD.  She feels stable.  At rest she is between 4 and 6 L.  When she works out she uses 8-12 L.  This is stable.  She has lost 15 pounds of weight.  For lung nodule she is finished radiation ending early July 2021.  She did have some side effects in the throat because of this.  She also has altered taste which she believes could be because of the Esbriet and Metformin.  Nevertheless overall she is tolerating the drugs fine.  She is happy about her weight loss.  She and her husband wanted know if hyperbaric oxygen will help her.  I have reached out to some colleagues through the pulmonary fibrosis foundation to figure this out.  She is agreeable to removing her central line Port-A-Cath which is currently not being used.  She also indicated to me that when she took probiotic along with antibiotics recently this helped reset her diarrhea.  She wants to know if she should take probiotics on a regular basis.  Discussed with Dr. Collene Mares about her personal professional opinion about this.  Echo March 2021: Normal RV function.  Grade 1 diastolic dysfunction.  PA pressures not calculated.   ROS - per HPI   OV 04/15/2020  Subjective:  Patient ID: Tasha Ballard, female , DOB: 21-Dec-1970 , age 20 y.o. , MRN: [id number] , ADDRESS: [place] [address] PCP Tasha Maid, MD Patient Care Team: Tasha Maid, MD as PCP - General (Family Medicine)  This Provider for this visit: Treatment Team:  Attending Provider: Brand Males, MD    04/15/2020 -   Chief Complaint  Patient presents with  . Follow-up    Drop in stats with exertion, constant cough    #Morbid Obesity - needs weight loss for transplant - sees Dr Janeal Holmes since dec 2019  - Lung transplant needed: Obesity Barrier :  Weight 231#  11/22/17  -> 221# 01/17/2018., -> 222# on 03/02/2018 -> 220# on 03/30/2018 ->  223# on 04/26/2018 -> 224# on 10/20/2018 -> 219# on 01/03/2019 -> 222#    #PRogressive NSIP #Chronic hypoxemic resp failure - 4L at rest, 8L with exertion in dec 2019 - dec 2020 6 to 8/12L # High risk treatment regimen  -CellCept 2013 through 2019  -Chronic prednisone 2013 -currently taking -Bactrim prophylaxis 2013 --currently taking  - Cytoxan Oral  45m per day 12/23/17 and increaed to 1055mon 12/23/17.On11/19/19. increase to 12557mer day   - Cytoxan IV : And switched to IV cytoxan Q4 week cycle   - 1st cycle 02/03/18 (s/p portocath 01/26/18)  = 2nd cycle 03/06/2018  - 3rd cycle  -  04/03/2018  - 4th cycle - 05/01/2018 - Cytoxan oral (due to pandemic covid-19 and need to avoid infusion visits) - spring 2020 - BAck on IV Cytoxan -   - 5th cycle - end sept 2020  - 6th cycle - end oct 2020  - 7th  Cycle - end  nov 2020 (full dose) 01/17/2019 -. Stopped - Immuran  - approx jan 2021  - Rehab + spring 2021 onwauys    - Ofev 01/23/18 (approximnate) - Esbriet -early-mid April 2021  #Nodule of lower lobe of left lung - new march 2021  - biodesk negative  - enarlged to  1.5cm may 20201 - PET HOT - empiric XRT  #Positive history of COVID-19 O micron 04/03/2020  -  Sotrovimab infusion 04/03/20   HPI YARELI CARTHEN 50 y.o. -presents for follow-up.  She is on dual antifibrotic therapy along with Imuran for her NSIP and chronic hypoxemic respiratory failure.  On 04/03/2020 following her outbreak within her family she ended up getting COVID-19 omicron infection.  She previously tested negative with an rapid antigen testing but given high suspicion Patricia Nettle our nurse practitioner tested PCR and was positive.  She then underwent monoclonal antibody neutralizing infusion 04/03/2020.  A week after that on 04/11/2020 she presented with to nurse practitioner because she was still feeling significantly symptomatic and miserable with worsening cough.  She had a chest x-ray that showed showed fibrotic  changes and was noncontributory.  I reviewed the records for this.  I visualized the chest x-ray.  Nurse practitioner asked the patient to take Z-Pak order completed.  Also give prednisone taper over the following week which is this week.  Give saline nasal spray.  Patient is here as a follow-up from the 04/11/2020 visit.  Overall her symptom profile is significantly deteriorated.  She tells me she is requiring 10-12 L of oxygen and even with that she is desaturating.  Normally even at 6 or 8 L she can go to the bathroom and not have a problem.  Currently with that level of oxygen she is desaturating into the 70s.  She is not on any anticoagulations denies any edema.  She has had some panic attacks where she got significantly short of breath.  This has been scary for her.  Currently nintedanib, pirfenidone and her Imuran are on hold for the last 3 to 4 days.  She is also coughing way more than usual.  She is asking for palliative relief of cough.  Last right heart catheterization was a few years ago at [facility] and she did not have pulmonary hypertension.   Of note she was going to participate in a pulse inhaled nitric oxide study and this is currently on hold.  PFT  PFT Results Latest Ref Rng & Units 01/25/2020 11/20/2018 03/30/2018  FVC-Pre L 1.15 1.16 1.09  FVC-Predicted Pre % 35 35 32  Pre FEV1/FVC % % 92 91 93  FEV1-Pre L 1.06 1.06 1.01  FEV1-Predicted Pre % 40 40 38  DLCO uncorrected ml/min/mmHg 5.44 7.29 6.85  DLCO UNC% % 27 37 34  DLCO corrected ml/min/mmHg 11.78 7.45 -  DLCO COR %Predicted % 60 37 -  DLVA Predicted % 150 93 92     OV 05/14/2020  Subjective:  Patient ID: Tasha Ballard, female , DOB: 06/21/1970 , age 42 y.o. , MRN: [id number] , ADDRESS: [place] [address] PCP Tasha Maid, MD Patient Care Team: Tasha Maid, MD as PCP - General (Family Medicine)  This Provider for this visit: Treatment Team:  Attending Provider: Brand Males,  MD    #Morbid Obesity - needs weight loss for transplant - sees Dr Janeal Holmes since dec 2019  - Lung transplant needed: Obesity Barrier :  Weight 231#  11/22/17  -> 221# 01/17/2018., -> 222# on  03/02/2018 -> 220# on 03/30/2018 -> 223# on 04/26/2018 -> 224# on 10/20/2018 -> 219# on 01/03/2019 -> 222# -> on 05/14/2020\ 197#    #PRogressive NSIP #Chronic hypoxemic resp failure - 4L at rest, 8L with exertion in dec 2019 - dec 2020 6 to 8/12L # High risk treatment regimen  -CellCept 2013 through 2019  -Chronic prednisone 2013 -currently taking -Bactrim prophylaxis 2013 --currently taking  - Cytoxan Oral  71m per day 12/23/17 and increaed to 1062mon 12/23/17.On11/19/19. increase to 12559mer day   - Cytoxan IV : And switched to IV cytoxan Q4 week cycle   - 1st cycle 02/03/18 (s/p portocath 01/26/18 - dec 2021)  = 2nd cycle 03/06/2018  - 3rd cycle  -  04/03/2018  - 4th cycle - 05/01/2018 - Cytoxan oral (due to pandemic covid-19 and need to avoid infusion visits) - spring 2020 - BAck on IV Cytoxan -   - 5th cycle - end sept 2020  - 6th cycle - end oct 2020  - 7th  Cycle - end nov 2020 (full dose) 01/17/2019 -. Stopped - Immuran  - approx jan 2021  - Rehab + spring 2021 onwauys    - Ofev 01/23/18 (approximnate) - Esbriet -early-mid April 2021  #Nodule of lower lobe of left lung - new march 2021  - biodesk negative  - enarlged to  1.5cm may 20201 - PET HOT - empiric XRT  #Positive history of COVID-19 O micron 04/03/2020  -  Sotrovimab infusion 04/03/20    05/14/2020 -   Chief Complaint  Patient presents with  . Follow-up    Pt states that her breathing has been worse since she had Covid. States she is now requiring more oxygen. States still has an occ cough.     HPI ChrTAMECA JEREZ 88o. -returns for follow-up.  Since her last visit she has not improved.  She had COVID in February 2022.  After that she had increasing oxygen needs.  This has not improved at all.  She tells me  that with 8 L at rest when she goes to the bathroom and comes back she feels dizzy and is 70%.  She is requiring 12-15 L with exertion at home.  There is a way worse than her baseline.  She lost weight into the 180s and now again into the 190s.  Nevertheless it still below her standard 220 pounds.  This is good news but her BMI is still extremely high precluding a transplant.  She continues on pirfenidone, nintedanib and Imuran with prednisone.  She is also telling that she is easily tachycardic.  She is on Coreg for this for many years.  She says this is not working though.  She took her husband's fast acting Lopressor 5 mg and this seemed to help her.  She is asking for a prescription of this.  At last visit we referred her to cardiology Dr. MclDaleen Squibbr right heart catheterization but she says she has not heard from the office yet.  Her husband sees Dr. DavGlenetta Hewd she is willing to see him.  We discussed lung transplant.  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  [facility] was all pre-Covid and her weight was up area.  [facility].  She is undergoing pulmonary rehabilitation on the  maintenance phase.  She tells me that they want to converted to the acute phase..  She is on the ILD pro registry study.   CT Chest data  No results found.   SYMPTOM SCALE - ILD 03/30/2018  04/26/2018  10/20/2018  01/03/2019  02/08/2019  04/15/2020  05/14/2020   O2 use  4 L at rest, 8 L with exertion Same but prefers 6L at all times 6L  6L at rest, 8-10 L with exertion 6L rest -> 93% Was on 8-10L with exertion. Now needing 12l  + with exertion and is post covid   Shortness of Breath 0 -> 5 scale with 5 being worst (score 6 If unable to do)        At rest 0 1 0 0 0 2   Simple tasks - showers, clothes change, eating, shaving 1  3.5 3.5 3._0 Household (dishes, doing bed, laundry) 2.5 4.5 3._1 Shopping 3 4.5 3._2 Walking level at own pace 3 3.5 3.5 3._3 Walking up Stairs 4 5 4._4 Total  Dyspnea Score 13.5 22 19._5 How bad is your cough? x 2.5 x mild worse 4 bettter  How bad is your fatigue 5 ("very, lol" 4 Extreme 5+ "lot" awful 4 awful  nausea      2 0  vomit      2 0  dirahhea      0 0  anxiety      Just with panic When out of bed  depression      0 worried       PFT  PFT Results Latest Ref Rng & Units 01/25/2020 11/20/2018 03/30/2018  FVC-Pre L 1.15 1.16 1.09  FVC-Predicted Pre % 35 35 32  Pre FEV1/FVC % % 92 91 93  FEV1-Pre L 1.06 1.06 1.01  FEV1-Predicted Pre % 40 40 38  DLCO uncorrected ml/min/mmHg 5.44 7.29 6.85  DLCO UNC% % 27 37 34  DLCO corrected ml/min/mmHg 11.78 7.45 -  DLCO COR %Predicted % 60 37 -  DLVA Predicted % 150 93 92       has a past medical history of Aneurysm (HCC), Back pain, Cancer ([place]), Diabetes mellitus without complication ([place]), H/O blood clots, Hypertension, Joint pain, Pulmonary fibrosis (HCC), SOB (shortness of breath), Swelling, Thyroid disease, and Vitamin D deficiency.   reports that she has never smoked. She has never used smokeless tobacco.  Past Surgical History:  Procedure Laterality Date  . ABLATION    . IR IMAGING GUIDED PORT INSERTION  01/26/2018  . IR REMOVAL TUN ACCESS W/ PORT W/O FL MOD SED  02/19/2020  . KNEE SURGERY    . LUNG BIOPSY      Allergies  Allergen Reactions  . Macrobid [Nitrofurantoin Macrocrystal] Nausea And Vomiting  . Adhesive [Tape] Rash    Immunization History  Administered Date(s) Administered  . Fluad Quad(high Dose 65+) 12/28/2019  . Influenza,inj,Quad PF,6+ Mos 12/24/2014, 02/05/2016, 01/07/2017, 11/22/2017, 10/20/2018  . Influenza-Unspecified 12/24/2014, 02/05/2016, 01/07/2017, 11/22/2017  . PFIZER(Purple Top)SARS-COV-2 Vaccination 04/06/2019, 04/30/2019, 10/18/2019  .  Pneumococcal Conjugate-13 07/20/2017  . Pneumococcal Polysaccharide-23 06/09/2012, 01/03/2019  . Tdap 02/19/2011    Family History  Problem Relation Age of Onset  . Heart failure Mother   . Heart disease Mother   . Rheumatologic disease Father      Current Outpatient Medications:  .  azathioprine (  IMURAN) 100 MG tablet, Take 1 tablet (100 mg total) by mouth daily., Disp: 90 tablet, Rfl: 3 .  benzonatate (TESSALON) 200 MG capsule, Take 1 capsule (200 mg total) by mouth 3 (three) times daily as needed for cough., Disp: 30 capsule, Rfl: 1 .  carvedilol (COREG) 6.25 MG tablet, Take 6.25 mg by mouth 2 (two) times daily with a meal., Disp: , Rfl:  .  ESBRIET 801 MG TABS, TAKE 1 TABLET BY MOUTH 3 (THREE) TIMES DAILY WITH MEALS. (Patient taking differently: Take 801 mg by mouth 3 (three) times daily with meals.), Disp: 90 tablet, Rfl: 11 .  famotidine (PEPCID) 40 MG tablet, TAKE 1 TABLET BY MOUTH EVERY DAY (Patient taking differently: Take 40 mg by mouth daily.), Disp: 90 tablet, Rfl: 3 .  folic acid (FOLVITE) 1 MG tablet, Take 1 tablet (1 mg total) by mouth daily., Disp: 30 tablet, Rfl: 5 .  furosemide (LASIX) 40 MG tablet, Take 40 mg by mouth See admin instructions. Take 1 tablet (40 mg) daily, may repeat dose if needed for fluid retention., Disp: , Rfl:  .  gabapentin (NEURONTIN) 100 MG capsule, Take 1 capsule (100 mg total) by mouth at bedtime as needed (For pain)., Disp: 30 capsule, Rfl: 5 .  montelukast (SINGULAIR) 10 MG tablet, TAKE 1 TABLET BY MOUTH EVERY DAY AFTER SUPPER (Patient taking differently: Take 10 mg by mouth daily after supper.), Disp: 90 tablet, Rfl: 1 .  OFEV 150 MG CAPS, TAKE 1 CAPSULE TWICE A DAY (12 HOURS APART) WITH FOOD (Patient taking differently: Take 150 mg by mouth in the morning and at bedtime.), Disp: 60 capsule, Rfl: 11 .  ondansetron (ZOFRAN) 8 MG tablet, TAKE 1 TABLET BY MOUTH EVERY 8 HOURS AS NEEDED FOR NAUSEA AND VOMITING (Patient taking differently: Take 8 mg by  mouth every 8 (eight) hours as needed for nausea or vomiting.), Disp: 45 tablet, Rfl: 2 .  potassium chloride (KLOR-CON) 10 MEQ tablet, TAKE 3 TABLETS BY MOUTH DAILY, Disp: 90 tablet, Rfl: 2 .  predniSONE (DELTASONE) 10 MG tablet, Take 1 tablet (10 mg total) by mouth daily., Disp: , Rfl:  .  spironolactone (ALDACTONE) 100 MG tablet, Take 100 mg by mouth daily., Disp: , Rfl:  .  sulfamethoxazole-trimethoprim (BACTRIM) 400-80 MG tablet, Take 1 tablet by mouth every Monday, Wednesday, and Friday., Disp: , Rfl:  .  thyroid (ARMOUR) 90 MG tablet, Take 90 mg by mouth daily before breakfast. , Disp: , Rfl:  .  Vitamin D, Ergocalciferol, (DRISDOL) 1.25 MG (50000 UT) CAPS capsule, TAKE 1 CAPSULE BY MOUTH ONCE WEEKLY (Patient taking differently: Take 50,000 Units by mouth every Saturday.), Disp: 3 capsule, Rfl: 0 .  chlorpheniramine-HYDROcodone (TUSSIONEX PENNKINETIC ER) 10-8 MG/5ML SUER, Take 5 mLs by mouth 2 (two) times daily. (Patient not taking: Reported on 05/14/2020), Disp: 240 mL, Rfl: 0      Objective:   Vitals:   05/14/20 0940  BP: 128/90  Pulse: (!) 111  Temp: (!) 97.2 F (36.2 C)  TempSrc: Temporal  SpO2: 100%  Weight: 197 lb (89.4 kg)  Height: _0  (1.575 m)    Estimated body mass index is 36.03 kg/m as calculated from the following:   Height as of this encounter: _1  (1.575 m).   Weight as of this encounter: 197 lb (89.4 kg).  _2 @  Filed Weights   05/14/20 0940  Weight: 197 lb (89.4 kg)     Physical Exam   General: No distress. obese Neuro: Alert and Oriented  x 3. GCS 15. Speech normal Psych: Pleasant Resp:  Barrel Chest - no.  Wheeze - n, Crackles - yes, No overt respiratory distress CVS: Normal heart sounds. Murmurs - no Ext: Stigmata of Connective Tissue Disease - no HEENT: Normal upper airway. PEERL +. No post nasal drip        Assessment:       ICD-10-CM   1. ILD (interstitial lung disease) ([place])  J84.9 Ambulatory Referral for DME  2.  Chronic respiratory failure with hypoxia (HCC)  J96.11 Ambulatory Referral for DME  3. Immunosuppressed status ([place])  D84.9   4. Encounter for therapeutic drug monitoring  Z51.81        Plan:     Patient Instructions     ICD-10-CM   1. ILD (interstitial lung disease) ([place])  J84.9 Ambulatory Referral for DME  2. Chronic respiratory failure with hypoxia (HCC)  J96.11 Ambulatory Referral for DME  3. Immunosuppressed status ([place])  D84.9   4. Encounter for therapeutic drug monitoring  Z51.81     Progressive ILD after Covid Now needing 12L [place] with exertion Easy tachycardia due to fibrosis despite coreg - also worse with time  Need to rule out pulmonary hypertension WHO GRoup 3  - unclear why no call from cardiology yet Ongoing weight loss- can help you move towards transplant of lung Agree with rehab recommendation to increase rehab  Plan -Continue oxygen with goal pulse ox greater than 88%  - will send script to [place] for 2nd concentrator to allow more o2  -Continue  Esbriet and Ofev -Continue  Imuran in -Contiunue daily prednisone -Refer to Cardiology (Dr. Glori Bickers or Dr. Loralie Champagne or Dr Glenetta Hew or Dr Adrian Prows)  - for urgent right heart catheterization evaluation.  - if abnormal will qualify for Tyvaso - refer [facility] program - start lopressor 69m once daily as needed for tachycardia while continuing coreg for this -Continue Hycodan cough syrup 5 mL twice daily for palliative relief of cough - refer pulmonary rehab for acute phase - cotninue weight loss - cotnue ILD PRO  Follow-up -Return to see Dr. RChase Callerin a 30-minute visit in 4-6 weeks     SIGNATURE    Dr. MBrand Ballard M.D., F.C.C.P,  Pulmonary and Critical Care Medicine Staff Physician, CW[place]Director - Interstitial Lung Disease  Program  [place] [id number] Pager: [phone] If no answer or between  15:00h - 7:00h: call [phone] Telephone: [phone]  10:23 AM 05/14/2020

## 2020-05-14 NOTE — Patient Instructions (Addendum)
ICD-10-CM   1. ILD (interstitial lung disease) ([place])  J84.9 Ambulatory Referral for DME  2. Chronic respiratory failure with hypoxia (HCC)  J96.11 Ambulatory Referral for DME  3. Immunosuppressed status ([place])  D84.9   4. Encounter for therapeutic drug monitoring  Z51.81     Progressive ILD after Covid Now needing 12L [place] with exertion Easy tachycardia due to fibrosis despite coreg - also worse with time  Need to rule out pulmonary hypertension WHO GRoup 3  - unclear why no call from cardiology yet Ongoing weight loss- can help you move towards transplant of lung Agree with rehab recommendation to increase rehab  Plan -Continue oxygen with goal pulse ox greater than 88%  - will send script to [place] for 2nd concentrator to allow more o2  -Continue  Esbriet and Ofev -Continue  Imuran in -Contiunue daily prednisone -Refer to Cardiology (Dr. Glori Bickers or Dr. Loralie Champagne or Dr Glenetta Hew or Dr Adrian Prows)  - for urgent right heart catheterization evaluation.  - if abnormal will qualify for Tyvaso - refer [facility] program - start lopressor 43m once daily as needed for tachycardia while continuing coreg for this -Continue Hycodan cough syrup 5 mL twice daily for palliative relief of cough - refer pulmonary rehab for acute phase - cotninue weight loss - cotnue ILD PRO  Follow-up -Return to see Dr. RChase Callerin a 30-minute visit in 4-6 weeks

## 2020-05-14 NOTE — Telephone Encounter (Signed)
Noted.   Will route message to provider.   MR please advise if you are ok with patient seeing another MD in practice. Thanks :)

## 2020-05-14 NOTE — Addendum Note (Signed)
Addended by: Lorretta Harp on: 05/14/2020 01:56 PM   Modules accepted: Orders

## 2020-05-15 ENCOUNTER — Ambulatory Visit (HOSPITAL_COMMUNITY): Payer: BC Managed Care – PPO

## 2020-05-15 ENCOUNTER — Encounter (HOSPITAL_COMMUNITY): Payer: Self-pay

## 2020-05-15 ENCOUNTER — Encounter (HOSPITAL_COMMUNITY): Payer: Self-pay | Admitting: *Deleted

## 2020-05-15 NOTE — Progress Notes (Signed)
I have been in contact with pt. She has gotten very weak since her Covid diagnosis and is now requiring 12-15 liter of oxygen for activity. We discussed that she will need more intense monitoring than with the [place] maintenance program. I am discharging her from the maintenance program and informed her that we would be in contact with her when we received an order from the pulmonologist for the [place] program. She is in agreeable with this plan.

## 2020-05-15 NOTE — Progress Notes (Signed)
Received referral from Dr. [name] for this pt to participate in pulmonary rehab with the the diagnosis ILD. Tasha Ballard who is well known by the pulmonary rehab staff from current participation in the pulmonary rehab Maintenance program although due to health related issues, she has not attended since December. Clinical review of pt follow up appt on 3/23 Pulmonary office note.  Pt with Covid Risk Score - 3. Pt appropriate for scheduling for Pulmonary rehab when able.  Currently we have a significant waitlist.  Will try to schedule sooner if there is a cancellation.  Will forward to support staff for scheduling and verification of insurance eligibility/benefits with pt consent. Cherre Huger, BSN Cardiac and Training and development officer

## 2020-05-15 NOTE — Telephone Encounter (Signed)
Triage  Pleas let patient know that I was able to touch base with Dr Haroldine Laws and he /Dr Aundra Dubin will arrange for it within a 1-2 weeks.   Thanks  MR

## 2020-05-15 NOTE — Telephone Encounter (Signed)
Patient will call and cancel her other apt and wait for Dr. Bensimhon/McLean's office to get in touch with her. Please use cell listed when making apt.

## 2020-05-20 ENCOUNTER — Encounter (HOSPITAL_COMMUNITY): Payer: Self-pay

## 2020-05-20 ENCOUNTER — Ambulatory Visit (HOSPITAL_COMMUNITY): Payer: BC Managed Care – PPO

## 2020-05-21 ENCOUNTER — Ambulatory Visit: Payer: Self-pay | Admitting: Cardiology

## 2020-05-22 ENCOUNTER — Ambulatory Visit (HOSPITAL_COMMUNITY): Payer: BC Managed Care – PPO

## 2020-05-22 ENCOUNTER — Encounter (HOSPITAL_COMMUNITY): Payer: Self-pay

## 2020-05-23 ENCOUNTER — Other Ambulatory Visit (HOSPITAL_COMMUNITY): Payer: Self-pay

## 2020-05-24 ENCOUNTER — Other Ambulatory Visit (HOSPITAL_COMMUNITY): Payer: Self-pay

## 2020-05-24 MED FILL — Pirfenidone Tab 801 MG: ORAL | 30 days supply | Qty: 90 | Fill #0 | Status: AC

## 2020-05-25 ENCOUNTER — Other Ambulatory Visit (HOSPITAL_COMMUNITY): Payer: Self-pay

## 2020-05-26 ENCOUNTER — Other Ambulatory Visit: Payer: Self-pay

## 2020-05-26 ENCOUNTER — Encounter: Payer: Self-pay | Admitting: Nurse Practitioner

## 2020-05-26 ENCOUNTER — Other Ambulatory Visit (HOSPITAL_COMMUNITY): Payer: Self-pay

## 2020-05-26 ENCOUNTER — Ambulatory Visit: Payer: BC Managed Care – PPO | Admitting: Nurse Practitioner

## 2020-05-26 VITALS — BP 124/80

## 2020-05-26 DIAGNOSIS — B373 Candidiasis of vulva and vagina: Secondary | ICD-10-CM

## 2020-05-26 DIAGNOSIS — R300 Dysuria: Secondary | ICD-10-CM | POA: Diagnosis not present

## 2020-05-26 DIAGNOSIS — B372 Candidiasis of skin and nail: Secondary | ICD-10-CM

## 2020-05-26 DIAGNOSIS — N9089 Other specified noninflammatory disorders of vulva and perineum: Secondary | ICD-10-CM

## 2020-05-26 DIAGNOSIS — B3731 Acute candidiasis of vulva and vagina: Secondary | ICD-10-CM

## 2020-05-26 DIAGNOSIS — N3001 Acute cystitis with hematuria: Secondary | ICD-10-CM | POA: Diagnosis not present

## 2020-05-26 LAB — WET PREP FOR TRICH, YEAST, CLUE

## 2020-05-26 MED ORDER — PHENAZOPYRIDINE HCL 200 MG PO TABS: 200.0000 mg | ORAL_TABLET | Freq: Three times a day (TID) | ORAL | 0 refills | Status: DC | PRN

## 2020-05-26 MED ORDER — NYSTATIN 100000 UNIT/GM EX OINT: TOPICAL_OINTMENT | Freq: Two times a day (BID) | CUTANEOUS | 0 refills | Status: DC

## 2020-05-26 MED ORDER — FLUCONAZOLE 150 MG PO TABS: 150.0000 mg | ORAL_TABLET | ORAL | 0 refills | Status: DC

## 2020-05-26 MED ORDER — CEPHALEXIN 500 MG PO CAPS
500.0000 mg | ORAL_CAPSULE | Freq: Two times a day (BID) | ORAL | 0 refills | Status: DC
Start: 2020-05-26 — End: 2020-05-29

## 2020-05-26 NOTE — Progress Notes (Signed)
Acute Office Visit  Subjective:    Patient ID: Tasha Ballard, female    DOB: 02-Oct-1970, 50 y.o.   MRN: [id number]   HPI 50 y.o. presents today for burning with urination, frequency, and vulvar irritation. Was sexually active with husband for first time in a few months and burning began shortly after. Takes Bactrim MWF as prophylaxis for nonspecific interstitial pneumonia. Also complains of redness under breasts.    Review of Systems  Constitutional: Negative.   Genitourinary: Positive for dysuria, urgency and vaginal pain.       Vulvar burning/irritaton       Objective:    Physical Exam Constitutional:      Appearance: Normal appearance.  Abdominal:     Tenderness: There is no abdominal tenderness. There is no right CVA tenderness or left CVA tenderness.  Genitourinary:    General: Normal vulva.     Vagina: Erythema present. No vaginal discharge.     BP 124/80  Wt Readings from Last 3 Encounters:  05/14/20 197 lb (89.4 kg)  04/15/20 197 lb 6.4 oz (89.5 kg)  04/11/20 197 lb (89.4 kg)   UA Blood 3+, rbc >60, nitrite positive, leukocytes 1+, wbc 20-40, moderate bacteria Wet prep + yeast     Assessment & Plan:   Problem List Items Addressed This Visit   None   Visit Diagnoses    Acute cystitis with hematuria    -  Primary   Relevant Medications   cephALEXin (KEFLEX) 500 MG capsule   Dysuria       Relevant Medications   phenazopyridine (PYRIDIUM) 200 MG tablet   Other Relevant Orders   Urinalysis,Complete w/RFL Culture (Completed)   Vaginal candidiasis       Relevant Medications   cephALEXin (KEFLEX) 500 MG capsule   fluconazole (DIFLUCAN) 150 MG tablet   nystatin ointment (MYCOSTATIN)   Skin yeast infection       Relevant Medications   cephALEXin (KEFLEX) 500 MG capsule   fluconazole (DIFLUCAN) 150 MG tablet   nystatin ointment (MYCOSTATIN)   Vulvar irritation       Relevant Orders   WET PREP FOR TRICH, YEAST, CLUE     Plan: UA shows probable  acute cystitis - Cephalexin 500 mg BID x 7 days.  Takes Bactrim as prophylaxis for lung disease, Macrobid is on allergy list but patient does not think it is a true allergy. Urine culture pending, will tiriage based on results.  Pyridium as needed for dysuria, she is aware to limit use. Diflucan 150 mg today and repeat in 3 days if symptoms persist for total of 2 doses. Nystatin ointment under breasts twice daily as needed and keep area clean and dry. She is agreeable to plan.     Tamela Gammon DNP, 3:49 PM 05/26/2020

## 2020-05-27 ENCOUNTER — Encounter (HOSPITAL_COMMUNITY): Payer: BC Managed Care – PPO

## 2020-05-27 ENCOUNTER — Telehealth (HOSPITAL_COMMUNITY): Payer: Self-pay

## 2020-05-27 ENCOUNTER — Ambulatory Visit (HOSPITAL_COMMUNITY): Payer: BC Managed Care – PPO

## 2020-05-27 NOTE — Telephone Encounter (Signed)
Called patient to see if she was interested in participating in the Pulmonary Rehab Program. Patient stated yes. Patient will come in for orientation on 06/02/2020_0 :30am and will attend the 11:00am exercise class.

## 2020-05-29 ENCOUNTER — Telehealth (HOSPITAL_COMMUNITY): Payer: Self-pay | Admitting: *Deleted

## 2020-05-29 ENCOUNTER — Telehealth: Payer: Self-pay | Admitting: Nurse Practitioner

## 2020-05-29 ENCOUNTER — Other Ambulatory Visit: Payer: Self-pay | Admitting: Internal Medicine

## 2020-05-29 ENCOUNTER — Other Ambulatory Visit: Payer: Self-pay | Admitting: Nurse Practitioner

## 2020-05-29 DIAGNOSIS — N3001 Acute cystitis with hematuria: Secondary | ICD-10-CM

## 2020-05-29 LAB — URINALYSIS, COMPLETE W/RFL CULTURE
Bilirubin Urine: NEGATIVE
Glucose, UA: NEGATIVE
Hyaline Cast: NONE SEEN /LPF
Ketones, ur: NEGATIVE
Nitrites, Initial: POSITIVE — AB
Protein, ur: NEGATIVE
RBC / HPF: >=60 /HPF — AB (ref 0–2)
Specific Gravity, Urine: 1.02 (ref 1.001–1.03)
pH: 7.5 (ref 5.0–8.0)

## 2020-05-29 LAB — CULTURE INDICATED

## 2020-05-29 LAB — URINE CULTURE
MICRO NUMBER:: 11727221
SPECIMEN QUALITY:: ADEQUATE

## 2020-05-29 MED ORDER — AMOXICILLIN-POT CLAVULANATE 500-125 MG PO TABS: 1.0000 | ORAL_TABLET | Freq: Two times a day (BID) | ORAL | 0 refills | Status: AC

## 2020-05-29 NOTE — Telephone Encounter (Signed)
Spoke with patient about urine culture results, limited options for treatment, and referral to urology for recurrent UTIs. Augmentin sent to pharmacy for coverage. If symptoms do not improve in 24-48 hours she will let us know. She is agreeable to plan.

## 2020-05-30 ENCOUNTER — Telehealth: Payer: Self-pay | Admitting: *Deleted

## 2020-05-30 NOTE — Telephone Encounter (Signed)
Office notes faxed to [facility] they will call to patient to schedule. Patient informed as well.

## 2020-05-30 NOTE — Telephone Encounter (Signed)
-----  Message from Tamela Gammon, NP sent at 05/29/2020  2:36 PM EDT ----- Please send referral to urology for recurrent UTIs. Thank you.

## 2020-06-02 ENCOUNTER — Encounter (HOSPITAL_COMMUNITY): Payer: Self-pay

## 2020-06-02 ENCOUNTER — Other Ambulatory Visit: Payer: Self-pay

## 2020-06-02 ENCOUNTER — Encounter (HOSPITAL_COMMUNITY)
Admission: RE | Admit: 2020-06-02 | Discharge: 2020-06-02 | Disposition: A | Discharge status: Home / Self Care | Payer: BC Managed Care – PPO | Source: Ambulatory Visit | Attending: Internal Medicine | Admitting: Internal Medicine

## 2020-06-02 VITALS — Ht 62.0 in | Wt 197.1 lb

## 2020-06-02 DIAGNOSIS — J849 Interstitial pulmonary disease, unspecified: Secondary | ICD-10-CM | POA: Diagnosis present

## 2020-06-02 NOTE — Progress Notes (Signed)
Tasha Ballard 50 y.o. female Pulmonary Rehab Orientation Note This patient who was referred to Pulmonary rehab by Dr. [name] with the diagnosis of interstitial lung disease arrived today in Cardiac and Pulmonary Rehab. She arrived ambulatory with normal gait. She does carry portable oxygen. [employer] is the provider for their DME. Per pt, she uses oxygen continuously. Color good, skin warm and dry. Patient is oriented to time and place. Patient's medical history, psychosocial health, and medications reviewed. Psychosocial assessment reveals pt lives with their spouse. 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 Pt hobbies include watching tik tok and managing her own business. Pt reports her stress level is moderate. Areas of stress/anxiety include dealing with a chronic illness and going through an evaluation for a  lung transplant.  Pt does not exhibit signs of depression. PHQ2/9 score 0/3. Pt shows good  coping skills with positive outlook . Will continue to monitor and evaluate progress toward psychosocial goal(s) of ability to handle stress in healthy ways. Physical assessment reveals heart rate is tachycardic, breath sounds clear to auscultation, no wheezes, rales, or rhonchi. Grip strength equal, strong. Distal pulses 3+ bilateral posterior tibial pulses present without peripheral edema. Patient reports she does take medications as prescribed. Patient states she follows a Regular diet. The patient has been aggressively trying to lose weight by unhealthy means..She has lost down to 185 pounds, but was sick with Covid, was put on prednisone and has gained some of the weight back, now is 197 pounds. Patient's weight will be monitored closely. Demonstration and practice of PLB using pulse oximeter. Patient able to return demonstration satisfactorily. Safety and hand hygiene in the exercise area reviewed with patient. Patient voices understanding of the information reviewed. Department  expectations discussed with patient and achievable goals were set. The patient shows enthusiasm about attending the program and we look forward to working with this nice lady, she has been through the program several times. The patient completed a 6 min walk test today, 06/02/2020 and to begin exercise on Thursday, 06/12/2020.  [id number]

## 2020-06-02 NOTE — Progress Notes (Signed)
Pulmonary Individual Treatment Plan  Patient Details  Name: Tasha Ballard MRN: [id number] Date of Birth: 1970/04/22 Referring Provider:   April Manson Pulmonary Rehab Walk Test from 06/02/2020 in [facility]  Referring Provider Dr. [name]      Initial Encounter Date:  Flowsheet Row Pulmonary Rehab Walk Test from 06/02/2020 in [facility]  Date 06/02/20      Visit Diagnosis: ILD (interstitial lung disease) ([place])  Patient's Home Medications on Admission:   Current Outpatient Medications:  .  amoxicillin-clavulanate (AUGMENTIN) 500-125 MG tablet, Take 1 tablet (500 mg total) by mouth in the morning and at bedtime for 7 days., Disp: 14 tablet, Rfl: 0 .  azathioprine (IMURAN) 100 MG tablet, Take 1 tablet (100 mg total) by mouth daily., Disp: 90 tablet, Rfl: 3 .  benzonatate (TESSALON) 200 MG capsule, Take 1 capsule (200 mg total) by mouth 3 (three) times daily as needed for cough., Disp: 30 capsule, Rfl: 1 .  carvedilol (COREG) 6.25 MG tablet, Take 6.25 mg by mouth 2 (two) times daily with a meal., Disp: , Rfl:  .  chlorpheniramine-HYDROcodone (TUSSIONEX PENNKINETIC ER) 10-8 MG/5ML SUER, Take 5 mLs by mouth 2 (two) times daily., Disp: 240 mL, Rfl: 0 .  famotidine (PEPCID) 40 MG tablet, TAKE 1 TABLET BY MOUTH EVERY DAY, Disp: 90 tablet, Rfl: 3 .  folic acid (FOLVITE) 1 MG tablet, Take 1 tablet (1 mg total) by mouth daily., Disp: 30 tablet, Rfl: 5 .  furosemide (LASIX) 40 MG tablet, Take 40 mg by mouth See admin instructions. Take 1 tablet (40 mg) daily, may repeat dose if needed for fluid retention., Disp: , Rfl:  .  gabapentin (NEURONTIN) 100 MG capsule, Take 1 capsule (100 mg total) by mouth at bedtime as needed (For pain)., Disp: 30 capsule, Rfl: 5 .  metoprolol tartrate (LOPRESSOR) 25 MG tablet, Take 1 tablet (25 mg total) by mouth as needed (tachycardia)., Disp: 30 tablet, Rfl: 3 .  montelukast (SINGULAIR) 10 MG tablet,  TAKE 1 TABLET BY MOUTH EVERY DAY AFTER SUPPER (Patient taking differently: Take 10 mg by mouth daily after supper.), Disp: 90 tablet, Rfl: 1 .  nystatin ointment (MYCOSTATIN), Apply topically 2 (two) times daily., Disp: 30 g, Rfl: 0 .  OFEV 150 MG CAPS, TAKE 1 CAPSULE TWICE A DAY (12 HOURS APART) WITH FOOD (Patient taking differently: Take 150 mg by mouth in the morning and at bedtime.), Disp: 60 capsule, Rfl: 11 .  ondansetron (ZOFRAN) 8 MG tablet, TAKE 1 TABLET BY MOUTH EVERY 8 HOURS AS NEEDED FOR NAUSEA AND VOMITING (Patient taking differently: Take 8 mg by mouth every 8 (eight) hours as needed for nausea or vomiting.), Disp: 45 tablet, Rfl: 2 .  phenazopyridine (PYRIDIUM) 200 MG tablet, Take 1 tablet (200 mg total) by mouth 3 (three) times daily as needed for pain., Disp: 10 tablet, Rfl: 0 .  Pirfenidone 801 MG TABS, TAKE 1 TABLET BY MOUTH 3 TIMES DAILY WITH MEALS (Patient taking differently: Take 801 mg by mouth 3 (three) times daily with meals.), Disp: 90 tablet, Rfl: 11 .  potassium chloride (KLOR-CON) 10 MEQ tablet, TAKE 3 TABLETS BY MOUTH DAILY, Disp: 90 tablet, Rfl: 2 .  predniSONE (DELTASONE) 10 MG tablet, Take 1 tablet (10 mg total) by mouth daily., Disp: , Rfl:  .  spironolactone (ALDACTONE) 100 MG tablet, Take 100 mg by mouth daily., Disp: , Rfl:  .  sulfamethoxazole-trimethoprim (BACTRIM) 400-80 MG tablet, Take 1 tablet by  mouth every Monday, Wednesday, and Friday., Disp: , Rfl:  .  thyroid (ARMOUR) 90 MG tablet, Take 90 mg by mouth daily before breakfast. , Disp: , Rfl:  .  Vitamin D, Ergocalciferol, (DRISDOL) 1.25 MG (50000 UT) CAPS capsule, TAKE 1 CAPSULE BY MOUTH ONCE WEEKLY (Patient taking differently: Take 50,000 Units by mouth every Saturday.), Disp: 3 capsule, Rfl: 0 .  fluconazole (DIFLUCAN) 150 MG tablet, Take 1 tablet (150 mg total) by mouth every 3 (three) days. (Patient not taking: Reported on 06/02/2020), Disp: 2 tablet, Rfl: 0  Past Medical History: Past Medical History:   Diagnosis Date  . Aneurysm ([place])   . Back pain   . Cancer ([place])   . Diabetes mellitus without complication ([place])   . H/O blood clots   . Hypertension   . Joint pain   . Pulmonary fibrosis ([place])   . SOB (shortness of breath)   . Swelling   . Thyroid disease   . Vitamin D deficiency     Tobacco Use: Social History   Tobacco Use  Smoking Status Never Smoker  Smokeless Tobacco Never Used    Labs: Recent Review Scientist, physiological    Labs for ITP Cardiac and Pulmonary Rehab Latest Ref Rng & Units 02/13/2018   Cholestrol 100 - 199 mg/dL 295(H)   LDLCALC 0 - 99 mg/dL 186(H)   HDL >39 mg/dL 85   Trlycerides 0 - 149 mg/dL 120   Hemoglobin A1c 4.8 - 5.6 % 5.6      Capillary Blood Glucose: Lab Results  Component Value Date   GLUCAP 130 (H) 02/19/2020   GLUCAP 126 (H) 12/28/2019   GLUCAP 99 12/28/2019   GLUCAP 88 12/28/2019   GLUCAP 140 (H) 12/27/2019     Pulmonary Assessment Scores:  Pulmonary Assessment Scores    Row Name 06/02/20 1132 06/02/20 1221       ADL UCSD   ADL Phase Entry --    SOB Score total 77 --         CAT Score   CAT Score 24 --         mMRC Score   mMRC Score -- 3          UCSD: Self-administered rating of dyspnea associated with activities of daily living (ADLs) 6-point scale (0 = "not at all" to 5 = "maximal or unable to do because of breathlessness")  Scoring Scores range from 0 to 120.  Minimally important difference is 5 units  CAT: CAT can identify the health impairment of COPD patients and is better correlated with disease progression.  CAT has a scoring range of zero to 40. The CAT score is classified into four groups of low (less than 10), medium (10 - 20), high (21-30) and very high (31-40) based on the impact level of disease on health status. A CAT score over 10 suggests significant symptoms.  A worsening CAT score could be explained by an exacerbation, poor medication adherence, poor inhaler technique, or progression of COPD or  comorbid conditions.  CAT MCID is 2 points  mMRC: mMRC (Modified Medical Research Council) Dyspnea Scale is used to assess the degree of baseline functional disability in patients of respiratory disease due to dyspnea. No minimal important difference is established. A decrease in score of 1 point or greater is considered a positive change.   Pulmonary Function Assessment:  Pulmonary Function Assessment - 06/02/20 1131      Breath   Bilateral Breath Sounds Clear    Shortness  of Breath Yes;Fear of Shortness of Breath;Limiting activity           Exercise Target Goals: Exercise Program Goal: Individual exercise prescription set using results from initial 6 min walk test and THRR while considering  patient's activity barriers and safety.   Exercise Prescription Goal: Initial exercise prescription builds to 30-45 minutes a day of aerobic activity, 2-3 days per week.  Home exercise guidelines will be given to patient during program as part of exercise prescription that the participant will acknowledge.  Activity Barriers & Risk Stratification:  Activity Barriers & Cardiac Risk Stratification - 06/02/20 1115      Activity Barriers & Cardiac Risk Stratification   Activity Barriers Deconditioning;Muscular Weakness;Shortness of Breath           6 Minute Walk:  6 Minute Walk    Row Name 06/02/20 1221         6 Minute Walk   Phase Initial     Distance 1025 feet     Walk Time 6 minutes     # of Rest Breaks 0     MPH 1.94     METS 3.65     RPE 11     Perceived Dyspnea  1.5     VO2 Peak 12.79     Symptoms No     Resting HR 97 bpm     Resting BP 120/88     Resting Oxygen Saturation  99 %     Exercise Oxygen Saturation  during 6 min walk 87 %     Max Ex. HR 134 bpm     Max Ex. BP 170/98     2 Minute Post BP 154/90           Interval HR   1 Minute HR 118     2 Minute HR 124     3 Minute HR 134     4 Minute HR 127     5 Minute HR 130     6 Minute HR 134     2 Minute  Post HR 111     Interval Heart Rate? Yes           Interval Oxygen   Interval Oxygen? Yes     Baseline Oxygen Saturation % 99 %     1 Minute Oxygen Saturation % 96 %     1 Minute Liters of Oxygen 10 L     2 Minute Oxygen Saturation % 91 %     2 Minute Liters of Oxygen 10 L     3 Minute Oxygen Saturation % 87 %     3 Minute Liters of Oxygen 10 L  Increased to 15L     4 Minute Oxygen Saturation % 93 %     4 Minute Liters of Oxygen 15 L     5 Minute Oxygen Saturation % 93 %     5 Minute Liters of Oxygen 15 L     6 Minute Oxygen Saturation % 93 %     6 Minute Liters of Oxygen 15 L     2 Minute Post Oxygen Saturation % 99 %     2 Minute Post Liters of Oxygen 10 L            Oxygen Initial Assessment:  Oxygen Initial Assessment - 06/02/20 1128      Home Oxygen   Home Oxygen Device Home Concentrator;E-Tanks    Sleep Oxygen Prescription Continuous    Liters per  minute 8    Home Exercise Oxygen Prescription Continuous    Liters per minute 12    Home Resting Oxygen Prescription Continuous    Liters per minute 6    Compliance with Home Oxygen Use Yes      Initial 6 min Walk   Oxygen Used Continuous;E-Tanks    Liters per minute 10   Had to increase to 15L     Program Oxygen Prescription   Program Oxygen Prescription Continuous;E-Tanks    Liters per minute 10    Comments 10L for seated, 15L for walking      Intervention   Short Term Goals To learn and exhibit compliance with exercise, home and travel O2 prescription;To learn and understand importance of monitoring SPO2 with pulse oximeter and demonstrate accurate use of the pulse oximeter.;To learn and understand importance of maintaining oxygen saturations>88%;To learn and demonstrate proper pursed lip breathing techniques or other breathing techniques.;To learn and demonstrate proper use of respiratory medications    Long  Term Goals Exhibits compliance with exercise, home and travel O2 prescription;Verbalizes importance of  monitoring SPO2 with pulse oximeter and return demonstration;Maintenance of O2 saturations>88%;Exhibits proper breathing techniques, such as pursed lip breathing or other method taught during program session;Compliance with respiratory medication;Demonstrates proper use of MDI's           Oxygen Re-Evaluation:   Oxygen Discharge (Final Oxygen Re-Evaluation):   Initial Exercise Prescription:  Initial Exercise Prescription - 06/02/20 1300      Date of Initial Exercise RX and Referring Provider   Date 06/02/20    Referring Provider Dr. [name]    Expected Discharge Date 08/07/20      Oxygen   Oxygen Continuous    Liters 10-15      Treadmill   MPH 1.6    Grade 0    Minutes 15      NuStep   Level 2    SPM 80    Minutes 15      Prescription Details   Frequency (times per week) 2    Duration Progress to 30 minutes of continuous aerobic without signs/symptoms of physical distress      Intensity   THRR 40-80% of Max Heartrate 68-135    Ratings of Perceived Exertion 11-13    Perceived Dyspnea 0-4      Progression   Progression Continue to progress workloads to maintain intensity without signs/symptoms of physical distress.      Resistance Training   Training Prescription Yes    Weight Red bands    Reps 10-15           Perform Capillary Blood Glucose checks as needed.  Exercise Prescription Changes:   Exercise Comments:   Exercise Goals and Review:  Exercise Goals    Row Name 06/02/20 1218             Exercise Goals   Increase Physical Activity Yes       Intervention Provide advice, education, support and counseling about physical activity/exercise needs.;Develop an individualized exercise prescription for aerobic and resistive training based on initial evaluation findings, risk stratification, comorbidities and participant's personal goals.       Expected Outcomes Short Term: Attend rehab on a regular basis to increase amount of physical  activity.;Long Term: Add in home exercise to make exercise part of routine and to increase amount of physical activity.;Long Term: Exercising regularly at least 3-5 days a week.       Increase Strength and Stamina Yes  Intervention Provide advice, education, support and counseling about physical activity/exercise needs.;Develop an individualized exercise prescription for aerobic and resistive training based on initial evaluation findings, risk stratification, comorbidities and participant's personal goals.       Expected Outcomes Short Term: Increase workloads from initial exercise prescription for resistance, speed, and METs.;Short Term: Perform resistance training exercises routinely during rehab and add in resistance training at home;Long Term: Improve cardiorespiratory fitness, muscular endurance and strength as measured by increased METs and functional capacity (6MWT)       Able to understand and use rate of perceived exertion (RPE) scale Yes       Intervention Provide education and explanation on how to use RPE scale       Expected Outcomes Short Term: Able to use RPE daily in rehab to express subjective intensity level;Long Term:  Able to use RPE to guide intensity level when exercising independently       Able to understand and use Dyspnea scale Yes       Intervention Provide education and explanation on how to use Dyspnea scale       Expected Outcomes Short Term: Able to use Dyspnea scale daily in rehab to express subjective sense of shortness of breath during exertion;Long Term: Able to use Dyspnea scale to guide intensity level when exercising independently       Knowledge and understanding of Target Heart Rate Range (THRR) Yes       Intervention Provide education and explanation of THRR including how the numbers were predicted and where they are located for reference       Expected Outcomes Short Term: Able to state/look up THRR;Long Term: Able to use THRR to govern intensity when  exercising independently;Short Term: Able to use daily as guideline for intensity in rehab       Understanding of Exercise Prescription Yes       Intervention Provide education, explanation, and written materials on patient's individual exercise prescription       Expected Outcomes Short Term: Able to explain program exercise prescription;Long Term: Able to explain home exercise prescription to exercise independently              Exercise Goals Re-Evaluation :   Discharge Exercise Prescription (Final Exercise Prescription Changes):   Nutrition:  Target Goals: Understanding of nutrition guidelines, daily intake of sodium <1590m, cholesterol <2062m calories 30% from fat and 7% or less from saturated fats, daily to have 5 or more servings of fruits and vegetables.  Biometrics:  Pre Biometrics - 06/02/20 1115      Pre Biometrics   Height _0  (1.575 m)    Weight 89.4 kg    BMI (Calculated) 36.04    Grip Strength 19 kg            Nutrition Therapy Plan and Nutrition Goals:   Nutrition Assessments:  MEDIFICTS Score Key:  ?70 Need to make dietary changes   40-70 Heart Healthy Diet  ? 40 Therapeutic Level Cholesterol Diet   Picture Your Plate Scores:  <4<89nhealthy dietary pattern with much room for improvement.  41-50 Dietary pattern unlikely to meet recommendations for good health and room for improvement.  51-60 More healthful dietary pattern, with some room for improvement.   >60 Healthy dietary pattern, although there may be some specific behaviors that could be improved.    Nutrition Goals Re-Evaluation:   Nutrition Goals Discharge (Final Nutrition Goals Re-Evaluation):   Psychosocial: Target Goals: Acknowledge presence or absence of significant depression and/or  stress, maximize coping skills, provide positive support system. Participant is able to verbalize types and ability to use techniques and skills needed for reducing stress and  depression.  Initial Review & Psychosocial Screening:  Initial Psych Review & Screening - 06/02/20 1135      Initial Review   Current issues with Current Stress Concerns   Is going through the lung transplant evaluation at [place]? Yes      Barriers   Psychosocial barriers to participate in program The patient should benefit from training in stress management and relaxation.      Screening Interventions   Interventions Encouraged to exercise    Expected Outcomes Long Term Goal: Stressors or current issues are controlled or eliminated.           Quality of Life Scores:  Scores of 19 and below usually indicate a poorer quality of life in these areas.  A difference of  2-3 points is a clinically meaningful difference.  A difference of 2-3 points in the total score of the Quality of Life Index has been associated with significant improvement in overall quality of life, self-image, physical symptoms, and general health in studies assessing change in quality of life.  PHQ-9: Recent Review Flowsheet Data    Depression screen [facility] 2/9 06/02/2020   Decreased Interest 0   Down, Depressed, Hopeless 0   PHQ - 2 Score 0   Altered sleeping 0   Tired, decreased energy 2   Change in appetite 1   Feeling bad or failure about yourself  0   Trouble concentrating 0   Moving slowly or fidgety/restless 0   Suicidal thoughts 0   PHQ-9 Score 3   Difficult doing work/chores Not difficult at all     Interpretation of Total Score  Total Score Depression Severity:  1-4 = Minimal depression, 5-9 = Mild depression, 10-14 = Moderate depression, 15-19 = Moderately severe depression, 20-27 = Severe depression   Psychosocial Evaluation and Intervention:  Psychosocial Evaluation - 06/02/20 1144      Psychosocial Evaluation & Interventions   Interventions Stress management education;Relaxation  education;Encouraged to exercise with the program and follow exercise prescription    Continue Psychosocial Services  No Follow up required           Psychosocial Re-Evaluation:   Psychosocial Discharge (Final Psychosocial Re-Evaluation):   Education: Education Goals: Education classes will be provided on a weekly basis, covering required topics. Participant will state understanding/return demonstration of topics presented.  Learning Barriers/Preferences:  Learning Barriers/Preferences - 06/02/20 1145      Learning Barriers/Preferences   Learning Barriers None    Learning Preferences Computer/Internet           Education Topics: Risk Factor Reduction:  -Group instruction that is supported by a PowerPoint presentation. Instructor discusses the definition of a risk factor, different risk factors for pulmonary disease, and how the heart and lungs work together.   Flowsheet Row PULMONARY REHAB OTHER RESPIRATORY from 05/31/2019 in [facility]  Date 05/31/19  Educator DF  Instruction Review Code 2- Demonstrated Understanding      Nutrition for Pulmonary Patient:  -Group instruction provided by PowerPoint slides, verbal discussion, and written materials to support subject matter. The instructor gives an explanation and review of healthy diet recommendations, which includes a discussion on weight management, recommendations for fruit and vegetable consumption, as  well as protein, fluid, caffeine, fiber, sodium, sugar, and alcohol. Tips for eating when patients are short of breath are discussed. Flowsheet Row PULMONARY REHAB OTHER RESPIRATORY from 05/31/2019 in [place]  Date 05/29/19  Educator MD  Instruction Review Code 2- Demonstrated Understanding      Pursed Lip Breathing:  -Group instruction that is supported by demonstration and informational handouts. Instructor discusses the benefits of pursed lip and  diaphragmatic breathing and detailed demonstration on how to preform both.     Oxygen Safety:  -Group instruction provided by PowerPoint, verbal discussion, and written material to support subject matter. There is an overview of "What is Oxygen" and "Why do we need it".  Instructor also reviews how to create a safe environment for oxygen use, the importance of using oxygen as prescribed, and the risks of noncompliance. There is a brief discussion on traveling with oxygen and resources the patient may utilize. Flowsheet Row PULMONARY REHAB OTHER RESPIRATORY from 12/15/2017 in [place]  Date 11/24/17  Educator Cloyde Reams  Instruction Review Code 1- Verbalizes Understanding      Oxygen Equipment:  -Group instruction provided by [employer] utilizing handouts, written materials, and Insurance underwriter.   Signs and Symptoms:  -Group instruction provided by written material and verbal discussion to support subject matter. Warning signs and symptoms of infection, stroke, and heart attack are reviewed and when to call the physician/911 reinforced. Tips for preventing the spread of infection discussed. Flowsheet Row PULMONARY REHAB OTHER RESPIRATORY from 12/15/2017 in [place]  Date 09/01/17  Educator RN  Instruction Review Code 1- Verbalizes Understanding      Advanced Directives:  -Group instruction provided by verbal instruction and written material to support subject matter. Instructor reviews Advanced Directive laws and proper instruction for filling out document.   Pulmonary Video:  -Group video education that reviews the importance of medication and oxygen compliance, exercise, good nutrition, pulmonary hygiene, and pursed lip and diaphragmatic breathing for the pulmonary patient.   Exercise for the Pulmonary Patient:  -Group instruction that is supported by a PowerPoint presentation. Instructor discusses  benefits of exercise, core components of exercise, frequency, duration, and intensity of an exercise routine, importance of utilizing pulse oximetry during exercise, safety while exercising, and options of places to exercise outside of rehab.   Flowsheet Row PULMONARY REHAB OTHER RESPIRATORY from 12/15/2017 in [facility]  Date 09/22/17  Educator Cloyde Reams  Instruction Review Code 1- Verbalizes Understanding      Pulmonary Medications:  -Verbally interactive group education provided by instructor with focus on inhaled medications and proper administration. Flowsheet Row PULMONARY REHAB OTHER RESPIRATORY from 12/15/2017 in [place]  Date 11/01/17  Educator pharm      Anatomy and Physiology of the Respiratory System and Intimacy:  -Group instruction provided by PowerPoint, verbal discussion, and written material to support subject matter. Instructor reviews respiratory cycle and anatomical components of the respiratory system and their functions. Instructor also reviews differences in obstructive and restrictive respiratory diseases with examples of each. Intimacy, Sex, and Sexuality differences are reviewed with a discussion on how relationships can change when diagnosed with pulmonary disease. Common sexual concerns are reviewed. Flowsheet Row PULMONARY REHAB OTHER RESPIRATORY from 05/31/2019 in [place]  Date 05/03/19  Educator Handout      MD DAY -A group question and answer session with a medical doctor that  allows participants to ask questions that relate to their pulmonary disease state. Flowsheet Row PULMONARY REHAB OTHER RESPIRATORY from 12/15/2017 in [facility]  Date 12/15/17  Educator yacoub  Instruction Review Code 2- Demonstrated Understanding      OTHER EDUCATION -Group or individual verbal, written, or video instructions that support the  educational goals of the pulmonary rehab program. Flowsheet Row PULMONARY REHAB OTHER RESPIRATORY from 05/31/2019 in [place]  Date 04/26/19  Educator DF  Gwyndolyn Kaufman your numbers]  Instruction Review Code 2- Demonstrated Understanding      Holiday Eating Survival Tips:  -Group instruction provided by PowerPoint slides, verbal discussion, and written materials to support subject matter. The instructor gives patients tips, tricks, and techniques to help them not only survive but enjoy the holidays despite the onslaught of food that accompanies the holidays.   Knowledge Questionnaire Score:  Knowledge Questionnaire Score - 06/02/20 1134      Knowledge Questionnaire Score   Pre Score 17/18           Core Components/Risk Factors/Patient Goals at Admission:  Personal Goals and Risk Factors at Admission - 06/02/20 1145      Core Components/Risk Factors/Patient Goals on Admission    Weight Management Obesity    Admit Weight 197 lb (89.4 kg)    Goal Weight: Long Term 185 lb (83.9 kg)    Improve shortness of breath with ADL's Yes    Intervention Provide education, individualized exercise plan and daily activity instruction to help decrease symptoms of SOB with activities of daily living.    Expected Outcomes Short Term: Improve cardiorespiratory fitness to achieve a reduction of symptoms when performing ADLs;Long Term: Be able to perform more ADLs without symptoms or delay the onset of symptoms           Core Components/Risk Factors/Patient Goals Review:   Goals and Risk Factor Review    Row Name 06/02/20 1146             Core Components/Risk Factors/Patient Goals Review   Personal Goals Review Weight Management/Obesity;Develop more efficient breathing techniques such as purse lipped breathing and diaphragmatic breathing and practicing self-pacing with activity.;Increase knowledge of respiratory medications and ability to use respiratory devices  properly.;Improve shortness of breath with ADL's              Core Components/Risk Factors/Patient Goals at Discharge (Final Review):   Goals and Risk Factor Review - 06/02/20 1146      Core Components/Risk Factors/Patient Goals Review   Personal Goals Review Weight Management/Obesity;Develop more efficient breathing techniques such as purse lipped breathing and diaphragmatic breathing and practicing self-pacing with activity.;Increase knowledge of respiratory medications and ability to use respiratory devices properly.;Improve shortness of breath with ADL's           ITP Comments:   Comments:

## 2020-06-06 ENCOUNTER — Other Ambulatory Visit: Payer: Self-pay | Admitting: Internal Medicine

## 2020-06-09 ENCOUNTER — Encounter: Payer: Self-pay | Admitting: Nurse Practitioner

## 2020-06-10 ENCOUNTER — Encounter (HOSPITAL_COMMUNITY): Payer: BC Managed Care – PPO

## 2020-06-10 ENCOUNTER — Telehealth: Payer: Self-pay | Admitting: Internal Medicine

## 2020-06-10 NOTE — Telephone Encounter (Signed)
Tasha Ballard  This is patient hwo needs RHC  Thanks  MR

## 2020-06-11 ENCOUNTER — Other Ambulatory Visit: Payer: Self-pay | Admitting: Nurse Practitioner

## 2020-06-11 ENCOUNTER — Other Ambulatory Visit (INDEPENDENT_AMBULATORY_CARE_PROVIDER_SITE_OTHER): Payer: BC Managed Care – PPO

## 2020-06-11 ENCOUNTER — Other Ambulatory Visit: Payer: Self-pay

## 2020-06-11 DIAGNOSIS — R300 Dysuria: Secondary | ICD-10-CM

## 2020-06-12 ENCOUNTER — Encounter (HOSPITAL_COMMUNITY)
Admission: RE | Admit: 2020-06-12 | Discharge: 2020-06-12 | Disposition: A | Discharge status: Home / Self Care | Payer: BC Managed Care – PPO | Source: Ambulatory Visit | Attending: Internal Medicine | Admitting: Internal Medicine

## 2020-06-12 DIAGNOSIS — J849 Interstitial pulmonary disease, unspecified: Secondary | ICD-10-CM

## 2020-06-12 NOTE — Progress Notes (Signed)
Daily Session Note  Patient Details  Name: Tasha Ballard MRN: [id number] Date of Birth: Apr 20, 1970 Referring Provider:   April Manson Pulmonary Rehab Walk Test from 06/02/2020 in [place]  Referring Provider Dr. [name]      Encounter Date: 06/12/2020  Check In:  Session Check In - 06/12/20 1113      Check-In   Supervising physician immediately available to respond to emergencies [facility] immediately available    Physician(s) Dr. Tawanna Solo    Location [facility]    Staff Present Rosebud Poles, RN, BSN;Lisa Ysidro Evert, RN;Jessica Hassell Done, MS, ACSM-CEP, Exercise Physiologist    Virtual Visit No    Medication changes reported     No    Fall or balance concerns reported    No    Tobacco Cessation No Change    Warm-up and Cool-down Performed on first and last piece of equipment    Resistance Training Performed Yes    VAD Patient? No    PAD/SET Patient? No      Pain Assessment   Currently in Pain? No/denies    Multiple Pain Sites No           Capillary Blood Glucose: No results found for this or any previous visit (from the past 24 hour(s)).    Social History   Tobacco Use  Smoking Status Never Smoker  Smokeless Tobacco Never Used    Goals Met:  Proper associated with RPD/PD & O2 Sat Exercise tolerated well Strength training completed today  Goals Unmet:  Not Applicable  Comments: Service time is from 1030 to [place]     Dr. Fransico Him is Medical Director for Cardiac Rehab at [facility]

## 2020-06-13 ENCOUNTER — Other Ambulatory Visit: Payer: Self-pay | Admitting: Nurse Practitioner

## 2020-06-13 DIAGNOSIS — R300 Dysuria: Secondary | ICD-10-CM

## 2020-06-13 MED ORDER — PHENAZOPYRIDINE HCL 200 MG PO TABS: 200.0000 mg | ORAL_TABLET | Freq: Three times a day (TID) | ORAL | 0 refills | Status: DC | PRN

## 2020-06-13 NOTE — Progress Notes (Signed)
Pt requested more Pyridium. Does not use it very much but concerned she will have more pain over the weekend. Advised that one concern is that the pain will be masked and she may not know if getting worse.   Rx sent as requested but encouraged pt that if she felt feverish, had blood or worsening symptoms, she should go to ER. [place], [facility]

## 2020-06-14 ENCOUNTER — Other Ambulatory Visit (HOSPITAL_COMMUNITY): Payer: Self-pay

## 2020-06-14 LAB — URINALYSIS W MICROSCOPIC + REFLEX CULTURE
Bilirubin Urine: NEGATIVE
Glucose, UA: NEGATIVE
Hgb urine dipstick: NEGATIVE
Hyaline Cast: NONE SEEN /LPF
Ketones, ur: NEGATIVE
Leukocyte Esterase: NEGATIVE
Nitrites, Initial: POSITIVE — AB
Protein, ur: NEGATIVE
RBC / HPF: NONE SEEN /HPF (ref 0–2)
Specific Gravity, Urine: 1.007 (ref 1.001–1.035)
Squamous Epithelial / HPF: NONE SEEN /HPF (ref <=5)
WBC, UA: NONE SEEN /HPF (ref 0–5)
pH: 7 (ref 5.0–8.0)

## 2020-06-14 LAB — NO CULTURE INDICATED

## 2020-06-16 ENCOUNTER — Other Ambulatory Visit (HOSPITAL_COMMUNITY): Payer: Self-pay

## 2020-06-17 ENCOUNTER — Other Ambulatory Visit: Payer: Self-pay

## 2020-06-17 ENCOUNTER — Encounter (HOSPITAL_COMMUNITY)
Admission: RE | Admit: 2020-06-17 | Discharge: 2020-06-17 | Disposition: A | Discharge status: Home / Self Care | Payer: BC Managed Care – PPO | Source: Ambulatory Visit | Attending: Internal Medicine | Admitting: Internal Medicine

## 2020-06-17 VITALS — Wt 197.0 lb

## 2020-06-17 DIAGNOSIS — J849 Interstitial pulmonary disease, unspecified: Secondary | ICD-10-CM | POA: Diagnosis not present

## 2020-06-17 NOTE — Progress Notes (Signed)
Tasha Ballard 50 y.o. female Nutrition Note  Diagnosis: ILD  Past Medical History:  Diagnosis Date  . Aneurysm ([place])   . Back pain   . Cancer ([place])   . Diabetes mellitus without complication ([place])   . H/O blood clots   . Hypertension   . Joint pain   . Pulmonary fibrosis ([place])   . SOB (shortness of breath)   . Swelling   . Thyroid disease   . Vitamin D deficiency      Medications reviewed.   Current Outpatient Medications:  .  azaTHIOprine (IMURAN) 50 MG tablet, TAKE 2 TABLETS (100MG) BY MOUTH EVERY DAY, Disp: 180 tablet, Rfl: 1 .  benzonatate (TESSALON) 200 MG capsule, Take 1 capsule (200 mg total) by mouth 3 (three) times daily as needed for cough., Disp: 30 capsule, Rfl: 1 .  carvedilol (COREG) 6.25 MG tablet, Take 6.25 mg by mouth 2 (two) times daily with a meal., Disp: , Rfl:  .  chlorpheniramine-HYDROcodone (TUSSIONEX PENNKINETIC ER) 10-8 MG/5ML SUER, Take 5 mLs by mouth 2 (two) times daily., Disp: 240 mL, Rfl: 0 .  famotidine (PEPCID) 40 MG tablet, TAKE 1 TABLET BY MOUTH EVERY DAY, Disp: 90 tablet, Rfl: 3 .  fluconazole (DIFLUCAN) 150 MG tablet, Take 1 tablet (150 mg total) by mouth every 3 (three) days. (Patient not taking: Reported on 06/02/2020), Disp: 2 tablet, Rfl: 0 .  folic acid (FOLVITE) 1 MG tablet, Take 1 tablet (1 mg total) by mouth daily., Disp: 30 tablet, Rfl: 5 .  furosemide (LASIX) 40 MG tablet, Take 40 mg by mouth See admin instructions. Take 1 tablet (40 mg) daily, may repeat dose if needed for fluid retention., Disp: , Rfl:  .  gabapentin (NEURONTIN) 100 MG capsule, Take 1 capsule (100 mg total) by mouth at bedtime as needed (For pain)., Disp: 30 capsule, Rfl: 5 .  metoprolol tartrate (LOPRESSOR) 25 MG tablet, Take 1 tablet (25 mg total) by mouth as needed (tachycardia)., Disp: 30 tablet, Rfl: 3 .  montelukast (SINGULAIR) 10 MG tablet, TAKE 1 TABLET BY MOUTH EVERY DAY AFTER SUPPER (Patient taking differently: Take 10 mg by mouth daily after supper.),  Disp: 90 tablet, Rfl: 1 .  nystatin ointment (MYCOSTATIN), Apply topically 2 (two) times daily., Disp: 30 g, Rfl: 0 .  OFEV 150 MG CAPS, TAKE 1 CAPSULE TWICE A DAY (12 HOURS APART) WITH FOOD (Patient taking differently: Take 150 mg by mouth in the morning and at bedtime.), Disp: 60 capsule, Rfl: 11 .  ondansetron (ZOFRAN) 8 MG tablet, TAKE 1 TABLET BY MOUTH EVERY 8 HOURS AS NEEDED FOR NAUSEA AND VOMITING (Patient taking differently: Take 8 mg by mouth every 8 (eight) hours as needed for nausea or vomiting.), Disp: 45 tablet, Rfl: 2 .  phenazopyridine (PYRIDIUM) 200 MG tablet, Take 1 tablet (200 mg total) by mouth 3 (three) times daily as needed for pain., Disp: 9 tablet, Rfl: 0 .  Pirfenidone 801 MG TABS, TAKE 1 TABLET BY MOUTH 3 TIMES DAILY WITH MEALS (Patient taking differently: Take 801 mg by mouth 3 (three) times daily with meals.), Disp: 90 tablet, Rfl: 11 .  potassium chloride (KLOR-CON) 10 MEQ tablet, TAKE 3 TABLETS BY MOUTH DAILY, Disp: 90 tablet, Rfl: 2 .  predniSONE (DELTASONE) 10 MG tablet, Take 1 tablet (10 mg total) by mouth daily., Disp: , Rfl:  .  spironolactone (ALDACTONE) 100 MG tablet, Take 100 mg by mouth daily., Disp: , Rfl:  .  sulfamethoxazole-trimethoprim (BACTRIM) 400-80 MG tablet, Take  1 tablet by mouth every Monday, Wednesday, and Friday., Disp: , Rfl:  .  thyroid (ARMOUR) 90 MG tablet, Take 90 mg by mouth daily before breakfast. , Disp: , Rfl:  .  Vitamin D, Ergocalciferol, (DRISDOL) 1.25 MG (50000 UT) CAPS capsule, TAKE 1 CAPSULE BY MOUTH ONCE WEEKLY (Patient taking differently: Take 50,000 Units by mouth every Saturday.), Disp: 3 capsule, Rfl: 0   Ht Readings from Last 1 Encounters:  06/02/20 _0  (1.575 m)     Wt Readings from Last 3 Encounters:  06/02/20 197 lb 1.5 oz (89.4 kg)  05/14/20 197 lb (89.4 kg)  04/15/20 197 lb 6.4 oz (89.5 kg)     There is no height or weight on file to calculate BMI.   Social History   Tobacco Use  Smoking Status Never Smoker   Smokeless Tobacco Never Used      Nutrition Note  Spoke with pt. Nutrition Plan and Nutrition Survey goals reviewed with pt.   Pt states goal of weight loss so she can be eligible for lung transplant. Goal weight 175 lbs.  She has lost ~35-40 lbs over the last 3 years. Barriers include difficulty cooking/preparing foods (although husband does most of this for pt), difficulty breathing/sedentary lifestyle, poor appetite, altered taste/smell since chemo and more recently covid19.  She has a nustep for exercise at home and is attending pulmonary rehab 2 days per week.  She is not experiencing N/V or diarrhea everyday. She reports these happening occasionally. Currently experiencing constipation and has started taking miralax.  She feels like she doesn't eat enough. In the past she has lost weight with weight watchers. She states needing structure that her husband can help her follow.  Reviewed food preferences. She does not eat or drink dairy d/t medications.   Pt expressed understanding of the information reviewed.   Nutrition Diagnosis ? Obese  II = 35-39.9 related to excessive energy intake and sedentary lifestyle as evidenced by a BMI 36.03 kgm2   Nutrition Intervention ? Pt's individual nutrition plan reviewed with pt. ? Benefits of adopting healthy diet reviewed with Rate My Plate survey   ? 1200 and 1500 calories 5 day meal plan  ? Continue client-centered nutrition education by RD, as part of interdisciplinary care.  Goal(s) ? Pt to identify food quantities necessary to achieve weight loss of 6-24 lb at graduation from pulmonary rehab.   Plan:   Will provide client-centered nutrition education as part of interdisciplinary care  Monitor and evaluate progress toward nutrition goal with team.   Michaele Offer, MS, RDN, LDN

## 2020-06-17 NOTE — Progress Notes (Signed)
Daily Session Note  Patient Details  Name: Tasha Ballard MRN: [id number] Date of Birth: 1970-08-09 Referring Provider:   April Manson Pulmonary Rehab Walk Test from 06/02/2020 in [place]  Referring Provider Dr. [name]      Encounter Date: 06/17/2020  Check In:  Session Check In - 06/17/20 1135      Check-In   Supervising physician immediately available to respond to emergencies [facility] immediately available    Physician(s) Dr. Tawanna Solo    Location [facility]    Staff Present Rosebud Poles, RN, BSN;Rollin Kotowski Ysidro Evert, RN;Jessica Hassell Done, MS, ACSM-CEP, Exercise Physiologist    Virtual Visit No    Medication changes reported     No    Fall or balance concerns reported    No    Tobacco Cessation No Change    Warm-up and Cool-down Performed on first and last piece of equipment    Resistance Training Performed Yes    VAD Patient? No    PAD/SET Patient? No      Pain Assessment   Currently in Pain? No/denies    Multiple Pain Sites No           Capillary Blood Glucose: No results found for this or any previous visit (from the past 24 hour(s)).    Social History   Tobacco Use  Smoking Status Never Smoker  Smokeless Tobacco Never Used    Goals Met:  Exercise tolerated well No report of cardiac concerns or symptoms Strength training completed today  Goals Unmet:  Not Applicable  Comments: Service time is from 1050 to 1150    Dr. Fransico Him is Medical Director for Cardiac Rehab at [facility].

## 2020-06-17 NOTE — Progress Notes (Signed)
Pulmonary Individual Treatment Plan  Patient Details  Name: Tasha Ballard MRN: [id number] Date of Birth: 1970-04-17 Referring Provider:   April Manson Pulmonary Rehab Walk Test from 06/02/2020 in [place]  Referring Provider Dr. [name]      Initial Encounter Date:  Flowsheet Row Pulmonary Rehab Walk Test from 06/02/2020 in [place]  Date 06/02/20      Visit Diagnosis: ILD (interstitial lung disease) ([place])  Patient's Home Medications on Admission:   Current Outpatient Medications:  .  azaTHIOprine (IMURAN) 50 MG tablet, TAKE 2 TABLETS (100MG) BY MOUTH EVERY DAY, Disp: 180 tablet, Rfl: 1 .  benzonatate (TESSALON) 200 MG capsule, Take 1 capsule (200 mg total) by mouth 3 (three) times daily as needed for cough., Disp: 30 capsule, Rfl: 1 .  carvedilol (COREG) 6.25 MG tablet, Take 6.25 mg by mouth 2 (two) times daily with a meal., Disp: , Rfl:  .  chlorpheniramine-HYDROcodone (TUSSIONEX PENNKINETIC ER) 10-8 MG/5ML SUER, Take 5 mLs by mouth 2 (two) times daily., Disp: 240 mL, Rfl: 0 .  famotidine (PEPCID) 40 MG tablet, TAKE 1 TABLET BY MOUTH EVERY DAY, Disp: 90 tablet, Rfl: 3 .  fluconazole (DIFLUCAN) 150 MG tablet, Take 1 tablet (150 mg total) by mouth every 3 (three) days. (Patient not taking: Reported on 06/02/2020), Disp: 2 tablet, Rfl: 0 .  folic acid (FOLVITE) 1 MG tablet, Take 1 tablet (1 mg total) by mouth daily., Disp: 30 tablet, Rfl: 5 .  furosemide (LASIX) 40 MG tablet, Take 40 mg by mouth See admin instructions. Take 1 tablet (40 mg) daily, may repeat dose if needed for fluid retention., Disp: , Rfl:  .  gabapentin (NEURONTIN) 100 MG capsule, Take 1 capsule (100 mg total) by mouth at bedtime as needed (For pain)., Disp: 30 capsule, Rfl: 5 .  metoprolol tartrate (LOPRESSOR) 25 MG tablet, Take 1 tablet (25 mg total) by mouth as needed (tachycardia)., Disp: 30 tablet, Rfl: 3 .  montelukast (SINGULAIR) 10 MG  tablet, TAKE 1 TABLET BY MOUTH EVERY DAY AFTER SUPPER (Patient taking differently: Take 10 mg by mouth daily after supper.), Disp: 90 tablet, Rfl: 1 .  nystatin ointment (MYCOSTATIN), Apply topically 2 (two) times daily., Disp: 30 g, Rfl: 0 .  OFEV 150 MG CAPS, TAKE 1 CAPSULE TWICE A DAY (12 HOURS APART) WITH FOOD (Patient taking differently: Take 150 mg by mouth in the morning and at bedtime.), Disp: 60 capsule, Rfl: 11 .  ondansetron (ZOFRAN) 8 MG tablet, TAKE 1 TABLET BY MOUTH EVERY 8 HOURS AS NEEDED FOR NAUSEA AND VOMITING (Patient taking differently: Take 8 mg by mouth every 8 (eight) hours as needed for nausea or vomiting.), Disp: 45 tablet, Rfl: 2 .  phenazopyridine (PYRIDIUM) 200 MG tablet, Take 1 tablet (200 mg total) by mouth 3 (three) times daily as needed for pain., Disp: 9 tablet, Rfl: 0 .  Pirfenidone 801 MG TABS, TAKE 1 TABLET BY MOUTH 3 TIMES DAILY WITH MEALS (Patient taking differently: Take 801 mg by mouth 3 (three) times daily with meals.), Disp: 90 tablet, Rfl: 11 .  potassium chloride (KLOR-CON) 10 MEQ tablet, TAKE 3 TABLETS BY MOUTH DAILY, Disp: 90 tablet, Rfl: 2 .  predniSONE (DELTASONE) 10 MG tablet, Take 1 tablet (10 mg total) by mouth daily., Disp: , Rfl:  .  spironolactone (ALDACTONE) 100 MG tablet, Take 100 mg by mouth daily., Disp: , Rfl:  .  sulfamethoxazole-trimethoprim (BACTRIM) 400-80 MG tablet, Take 1 tablet by  mouth every Monday, Wednesday, and Friday., Disp: , Rfl:  .  thyroid (ARMOUR) 90 MG tablet, Take 90 mg by mouth daily before breakfast. , Disp: , Rfl:  .  Vitamin D, Ergocalciferol, (DRISDOL) 1.25 MG (50000 UT) CAPS capsule, TAKE 1 CAPSULE BY MOUTH ONCE WEEKLY (Patient taking differently: Take 50,000 Units by mouth every Saturday.), Disp: 3 capsule, Rfl: 0  Past Medical History: Past Medical History:  Diagnosis Date  . Aneurysm ([place])   . Back pain   . Cancer ([place])   . Diabetes mellitus without complication ([place])   . H/O blood clots   . Hypertension   .  Joint pain   . Pulmonary fibrosis ([place])   . SOB (shortness of breath)   . Swelling   . Thyroid disease   . Vitamin D deficiency     Tobacco Use: Social History   Tobacco Use  Smoking Status Never Smoker  Smokeless Tobacco Never Used    Labs: Recent Review Scientist, physiological    Labs for ITP Cardiac and Pulmonary Rehab Latest Ref Rng & Units 02/13/2018   Cholestrol 100 - 199 mg/dL 295(H)   LDLCALC 0 - 99 mg/dL 186(H)   HDL >39 mg/dL 85   Trlycerides 0 - 149 mg/dL 120   Hemoglobin A1c 4.8 - 5.6 % 5.6      Capillary Blood Glucose: Lab Results  Component Value Date   GLUCAP 130 (H) 02/19/2020   GLUCAP 126 (H) 12/28/2019   GLUCAP 99 12/28/2019   GLUCAP 88 12/28/2019   GLUCAP 140 (H) 12/27/2019     Pulmonary Assessment Scores:  Pulmonary Assessment Scores    Row Name 06/02/20 1132 06/02/20 1221       ADL UCSD   ADL Phase Entry --    SOB Score total 77 --         CAT Score   CAT Score 24 --         mMRC Score   mMRC Score -- 3          UCSD: Self-administered rating of dyspnea associated with activities of daily living (ADLs) 6-point scale (0 = "not at all" to 5 = "maximal or unable to do because of breathlessness")  Scoring Scores range from 0 to 120.  Minimally important difference is 5 units  CAT: CAT can identify the health impairment of COPD patients and is better correlated with disease progression.  CAT has a scoring range of zero to 40. The CAT score is classified into four groups of low (less than 10), medium (10 - 20), high (21-30) and very high (31-40) based on the impact level of disease on health status. A CAT score over 10 suggests significant symptoms.  A worsening CAT score could be explained by an exacerbation, poor medication adherence, poor inhaler technique, or progression of COPD or comorbid conditions.  CAT MCID is 2 points  mMRC: mMRC (Modified Medical Research Council) Dyspnea Scale is used to assess the degree of baseline functional  disability in patients of respiratory disease due to dyspnea. No minimal important difference is established. A decrease in score of 1 point or greater is considered a positive change.   Pulmonary Function Assessment:  Pulmonary Function Assessment - 06/02/20 1131      Breath   Bilateral Breath Sounds Clear    Shortness of Breath Yes;Fear of Shortness of Breath;Limiting activity           Exercise Target Goals: Exercise Program Goal: Individual exercise prescription set using results  from initial 6 min walk test and THRR while considering  patient's activity barriers and safety.   Exercise Prescription Goal: Initial exercise prescription builds to 30-45 minutes a day of aerobic activity, 2-3 days per week.  Home exercise guidelines will be given to patient during program as part of exercise prescription that the participant will acknowledge.  Activity Barriers & Risk Stratification:  Activity Barriers & Cardiac Risk Stratification - 06/02/20 1115      Activity Barriers & Cardiac Risk Stratification   Activity Barriers Deconditioning;Muscular Weakness;Shortness of Breath           6 Minute Walk:  6 Minute Walk    Row Name 06/02/20 1221         6 Minute Walk   Phase Initial     Distance 1025 feet     Walk Time 6 minutes     # of Rest Breaks 0     MPH 1.94     METS 3.65     RPE 11     Perceived Dyspnea  1.5     VO2 Peak 12.79     Symptoms No     Resting HR 97 bpm     Resting BP 120/88     Resting Oxygen Saturation  99 %     Exercise Oxygen Saturation  during 6 min walk 87 %     Max Ex. HR 134 bpm     Max Ex. BP 170/98     2 Minute Post BP 154/90           Interval HR   1 Minute HR 118     2 Minute HR 124     3 Minute HR 134     4 Minute HR 127     5 Minute HR 130     6 Minute HR 134     2 Minute Post HR 111     Interval Heart Rate? Yes           Interval Oxygen   Interval Oxygen? Yes     Baseline Oxygen Saturation % 99 %     1 Minute Oxygen  Saturation % 96 %     1 Minute Liters of Oxygen 10 L     2 Minute Oxygen Saturation % 91 %     2 Minute Liters of Oxygen 10 L     3 Minute Oxygen Saturation % 87 %     3 Minute Liters of Oxygen 10 L  Increased to 15L     4 Minute Oxygen Saturation % 93 %     4 Minute Liters of Oxygen 15 L     5 Minute Oxygen Saturation % 93 %     5 Minute Liters of Oxygen 15 L     6 Minute Oxygen Saturation % 93 %     6 Minute Liters of Oxygen 15 L     2 Minute Post Oxygen Saturation % 99 %     2 Minute Post Liters of Oxygen 10 L            Oxygen Initial Assessment:  Oxygen Initial Assessment - 06/02/20 1128      Home Oxygen   Home Oxygen Device Home Concentrator;E-Tanks    Sleep Oxygen Prescription Continuous    Liters per minute 8    Home Exercise Oxygen Prescription Continuous    Liters per minute 12    Home Resting Oxygen Prescription Continuous    Liters per  minute 6    Compliance with Home Oxygen Use Yes      Initial 6 min Walk   Oxygen Used Continuous;E-Tanks    Liters per minute 10   Had to increase to 15L     Program Oxygen Prescription   Program Oxygen Prescription Continuous;E-Tanks    Liters per minute 10    Comments 10L for seated, 15L for walking      Intervention   Short Term Goals To learn and exhibit compliance with exercise, home and travel O2 prescription;To learn and understand importance of monitoring SPO2 with pulse oximeter and demonstrate accurate use of the pulse oximeter.;To learn and understand importance of maintaining oxygen saturations>88%;To learn and demonstrate proper pursed lip breathing techniques or other breathing techniques.;To learn and demonstrate proper use of respiratory medications    Long  Term Goals Exhibits compliance with exercise, home and travel O2 prescription;Verbalizes importance of monitoring SPO2 with pulse oximeter and return demonstration;Maintenance of O2 saturations>88%;Exhibits proper breathing techniques, such as pursed lip  breathing or other method taught during program session;Compliance with respiratory medication;Demonstrates proper use of MDI's           Oxygen Re-Evaluation:  Oxygen Re-Evaluation    Row Name 06/13/20 1456             Program Oxygen Prescription   Program Oxygen Prescription Continuous;E-Tanks       Liters per minute 10       Comments We originally thought pt would need 15L of oxygen on the treadmill due to desaturation on the walk test but so far she has required 10L for seated exercise and for walking on the treadmill.               Home Oxygen   Home Oxygen Device Home Concentrator;E-Tanks       Sleep Oxygen Prescription Continuous       Home Exercise Oxygen Prescription Continuous       Liters per minute 12       Home Resting Oxygen Prescription Continuous       Liters per minute 6       Compliance with Home Oxygen Use Yes               Goals/Expected Outcomes   Short Term Goals To learn and exhibit compliance with exercise, home and travel O2 prescription;To learn and understand importance of monitoring SPO2 with pulse oximeter and demonstrate accurate use of the pulse oximeter.;To learn and understand importance of maintaining oxygen saturations>88%;To learn and demonstrate proper pursed lip breathing techniques or other breathing techniques.;To learn and demonstrate proper use of respiratory medications       Long  Term Goals Exhibits compliance with exercise, home and travel O2 prescription;Verbalizes importance of monitoring SPO2 with pulse oximeter and return demonstration;Maintenance of O2 saturations>88%;Exhibits proper breathing techniques, such as pursed lip breathing or other method taught during program session;Compliance with respiratory medication;Demonstrates proper use of MDI's       Goals/Expected Outcomes Compliance and understanding of oxygen saturation and pursed lip breathing              Oxygen Discharge (Final Oxygen Re-Evaluation):  Oxygen  Re-Evaluation - 06/13/20 1456      Program Oxygen Prescription   Program Oxygen Prescription Continuous;E-Tanks    Liters per minute 10    Comments We originally thought pt would need 15L of oxygen on the treadmill due to desaturation on the walk test but so far she has required 10L for  seated exercise and for walking on the treadmill.      Home Oxygen   Home Oxygen Device Home Concentrator;E-Tanks    Sleep Oxygen Prescription Continuous    Home Exercise Oxygen Prescription Continuous    Liters per minute 12    Home Resting Oxygen Prescription Continuous    Liters per minute 6    Compliance with Home Oxygen Use Yes      Goals/Expected Outcomes   Short Term Goals To learn and exhibit compliance with exercise, home and travel O2 prescription;To learn and understand importance of monitoring SPO2 with pulse oximeter and demonstrate accurate use of the pulse oximeter.;To learn and understand importance of maintaining oxygen saturations>88%;To learn and demonstrate proper pursed lip breathing techniques or other breathing techniques.;To learn and demonstrate proper use of respiratory medications    Long  Term Goals Exhibits compliance with exercise, home and travel O2 prescription;Verbalizes importance of monitoring SPO2 with pulse oximeter and return demonstration;Maintenance of O2 saturations>88%;Exhibits proper breathing techniques, such as pursed lip breathing or other method taught during program session;Compliance with respiratory medication;Demonstrates proper use of MDI's    Goals/Expected Outcomes Compliance and understanding of oxygen saturation and pursed lip breathing           Initial Exercise Prescription:  Initial Exercise Prescription - 06/02/20 1300      Date of Initial Exercise RX and Referring Provider   Date 06/02/20    Referring Provider Dr. [name]    Expected Discharge Date 08/07/20      Oxygen   Oxygen Continuous    Liters 10-15      Treadmill   MPH 1.6     Grade 0    Minutes 15      NuStep   Level 2    SPM 80    Minutes 15      Prescription Details   Frequency (times per week) 2    Duration Progress to 30 minutes of continuous aerobic without signs/symptoms of physical distress      Intensity   THRR 40-80% of Max Heartrate 68-135    Ratings of Perceived Exertion 11-13    Perceived Dyspnea 0-4      Progression   Progression Continue to progress workloads to maintain intensity without signs/symptoms of physical distress.      Resistance Training   Training Prescription Yes    Weight Red bands    Reps 10-15           Perform Capillary Blood Glucose checks as needed.  Exercise Prescription Changes:   Exercise Comments:   Exercise Goals and Review:  Exercise Goals    Row Name 06/02/20 1218             Exercise Goals   Increase Physical Activity Yes       Intervention Provide advice, education, support and counseling about physical activity/exercise needs.;Develop an individualized exercise prescription for aerobic and resistive training based on initial evaluation findings, risk stratification, comorbidities and participant's personal goals.       Expected Outcomes Short Term: Attend rehab on a regular basis to increase amount of physical activity.;Long Term: Add in home exercise to make exercise part of routine and to increase amount of physical activity.;Long Term: Exercising regularly at least 3-5 days a week.       Increase Strength and Stamina Yes       Intervention Provide advice, education, support and counseling about physical activity/exercise needs.;Develop an individualized exercise prescription for aerobic and resistive training based on  initial evaluation findings, risk stratification, comorbidities and participant's personal goals.       Expected Outcomes Short Term: Increase workloads from initial exercise prescription for resistance, speed, and METs.;Short Term: Perform resistance training exercises  routinely during rehab and add in resistance training at home;Long Term: Improve cardiorespiratory fitness, muscular endurance and strength as measured by increased METs and functional capacity (6MWT)       Able to understand and use rate of perceived exertion (RPE) scale Yes       Intervention Provide education and explanation on how to use RPE scale       Expected Outcomes Short Term: Able to use RPE daily in rehab to express subjective intensity level;Long Term:  Able to use RPE to guide intensity level when exercising independently       Able to understand and use Dyspnea scale Yes       Intervention Provide education and explanation on how to use Dyspnea scale       Expected Outcomes Short Term: Able to use Dyspnea scale daily in rehab to express subjective sense of shortness of breath during exertion;Long Term: Able to use Dyspnea scale to guide intensity level when exercising independently       Knowledge and understanding of Target Heart Rate Range (THRR) Yes       Intervention Provide education and explanation of THRR including how the numbers were predicted and where they are located for reference       Expected Outcomes Short Term: Able to state/look up THRR;Long Term: Able to use THRR to govern intensity when exercising independently;Short Term: Able to use daily as guideline for intensity in rehab       Understanding of Exercise Prescription Yes       Intervention Provide education, explanation, and written materials on patient's individual exercise prescription       Expected Outcomes Short Term: Able to explain program exercise prescription;Long Term: Able to explain home exercise prescription to exercise independently              Exercise Goals Re-Evaluation :  Exercise Goals Re-Evaluation    Row Name 06/13/20 1451             Exercise Goal Re-Evaluation   Exercise Goals Review Increase Physical Activity;Increase Strength and Stamina;Able to understand and use rate of  perceived exertion (RPE) scale;Able to understand and use Dyspnea scale;Knowledge and understanding of Target Heart Rate Range (THRR);Understanding of Exercise Prescription       Comments Leroy Kennedy has completed 1 exercise session so far, which is too early to note any progression. She tolerated her first day of exercie well. We had originally thought she would need 15L of oxygen while walking on the treadmill due to desaturation on walk test, but she only required 10L on the treadmill on her first day of exercise. We will continue to monitor oxygen requirements with exertion. She is exercising at 1.5 METS on the Nustep and 2.2 METS on the treadmill. Will continue to monitor and progress as she is able.       Expected Outcomes Through exercise at rehab and at home, the patient will decrease shortness of breath with daily activities and feel confident in carrying out an exercise regime at home.               Discharge Exercise Prescription (Final Exercise Prescription Changes):   Nutrition:  Target Goals: Understanding of nutrition guidelines, daily intake of sodium <1557m, cholesterol <2029m calories 30% from fat  and 7% or less from saturated fats, daily to have 5 or more servings of fruits and vegetables.  Biometrics:  Pre Biometrics - 06/02/20 1115      Pre Biometrics   Height _0  (1.575 m)    Weight 89.4 kg    BMI (Calculated) 36.04    Grip Strength 19 kg            Nutrition Therapy Plan and Nutrition Goals:  Nutrition Therapy & Goals - 06/13/20 0828      Nutrition Therapy   RD appointment deferred Yes           Nutrition Assessments:  MEDIFICTS Score Key:  ?70 Need to make dietary changes   40-70 Heart Healthy Diet  ? 40 Therapeutic Level Cholesterol Diet   Picture Your Plate Scores:  <95 Unhealthy dietary pattern with much room for improvement.  41-50 Dietary pattern unlikely to meet recommendations for good health and room for improvement.  51-60 More  healthful dietary pattern, with some room for improvement.   >60 Healthy dietary pattern, although there may be some specific behaviors that could be improved.    Nutrition Goals Re-Evaluation:   Nutrition Goals Discharge (Final Nutrition Goals Re-Evaluation):   Psychosocial: Target Goals: Acknowledge presence or absence of significant depression and/or stress, maximize coping skills, provide positive support system. Participant is able to verbalize types and ability to use techniques and skills needed for reducing stress and depression.  Initial Review & Psychosocial Screening:  Initial Psych Review & Screening - 06/02/20 1135      Initial Review   Current issues with Current Stress Concerns   Is going through the lung transplant evaluation at [facility]? Yes      Barriers   Psychosocial barriers to participate in program The patient should benefit from training in stress management and relaxation.      Screening Interventions   Interventions Encouraged to exercise    Expected Outcomes Long Term Goal: Stressors or current issues are controlled or eliminated.           Quality of Life Scores:  Scores of 19 and below usually indicate a poorer quality of life in these areas.  A difference of  2-3 points is a clinically meaningful difference.  A difference of 2-3 points in the total score of the Quality of Life Index has been associated with significant improvement in overall quality of life, self-image, physical symptoms, and general health in studies assessing change in quality of life.  PHQ-9: Recent Review Flowsheet Data    Depression screen [facility] 2/9 06/02/2020   Decreased Interest 0   Down, Depressed, Hopeless 0   PHQ - 2 Score 0   Altered sleeping 0   Tired, decreased energy 2   Change in appetite 1   Feeling bad or failure about yourself  0   Trouble concentrating 0   Moving  slowly or fidgety/restless 0   Suicidal thoughts 0   PHQ-9 Score 3   Difficult doing work/chores Not difficult at all     Interpretation of Total Score  Total Score Depression Severity:  1-4 = Minimal depression, 5-9 = Mild depression, 10-14 = Moderate depression, 15-19 = Moderately severe depression, 20-27 = Severe depression   Psychosocial Evaluation and Intervention:  Psychosocial Evaluation - 06/02/20 1144      Psychosocial Evaluation & Interventions   Interventions Stress management  education;Relaxation education;Encouraged to exercise with the program and follow exercise prescription    Continue Psychosocial Services  No Follow up required           Psychosocial Re-Evaluation:  Psychosocial Re-Evaluation    [place] Name 06/10/20 0919             Psychosocial Re-Evaluation   Comments No change since orientation, she begins pulmonary rehab 06/12/2020.       Expected Outcomes [name] to handle her stress in healthy ways.  She is being evaluated for a lung transplant.       Interventions Encouraged to attend Pulmonary Rehabilitation for the exercise;Relaxation education;Stress management education       Continue Psychosocial Services  No Follow up required              Psychosocial Discharge (Final Psychosocial Re-Evaluation):  Psychosocial Re-Evaluation - 06/10/20 0919      Psychosocial Re-Evaluation   Comments No change since orientation, she begins pulmonary rehab 06/12/2020.    Expected Outcomes [name] to handle her stress in healthy ways.  She is being evaluated for a lung transplant.    Interventions Encouraged to attend Pulmonary Rehabilitation for the exercise;Relaxation education;Stress management education    Continue Psychosocial Services  No Follow up required           Education: Education Goals: Education classes will be provided on a weekly basis, covering required topics. Participant will state understanding/return demonstration of topics  presented.  Learning Barriers/Preferences:  Learning Barriers/Preferences - 06/02/20 1145      Learning Barriers/Preferences   Learning Barriers None    Learning Preferences Computer/Internet           Education Topics: Risk Factor Reduction:  -Group instruction that is supported by a PowerPoint presentation. Instructor discusses the definition of a risk factor, different risk factors for pulmonary disease, and how the heart and lungs work together.   Flowsheet Row PULMONARY REHAB OTHER RESPIRATORY from 05/31/2019 in [place]  Date 05/31/19  Educator DF  Instruction Review Code 2- Demonstrated Understanding      Nutrition for Pulmonary Patient:  -Group instruction provided by PowerPoint slides, verbal discussion, and written materials to support subject matter. The instructor gives an explanation and review of healthy diet recommendations, which includes a discussion on weight management, recommendations for fruit and vegetable consumption, as well as protein, fluid, caffeine, fiber, sodium, sugar, and alcohol. Tips for eating when patients are short of breath are discussed. Flowsheet Row PULMONARY REHAB OTHER RESPIRATORY from 05/31/2019 in [place]  Date 05/29/19  Educator MD  Instruction Review Code 2- Demonstrated Understanding      Pursed Lip Breathing:  -Group instruction that is supported by demonstration and informational handouts. Instructor discusses the benefits of pursed lip and diaphragmatic breathing and detailed demonstration on how to preform both.     Oxygen Safety:  -Group instruction provided by PowerPoint, verbal discussion, and written material to support subject matter. There is an overview of "What is Oxygen" and "Why do we need it".  Instructor also reviews how to create a safe environment for oxygen use, the importance of using oxygen as prescribed, and the risks of noncompliance. There is a  brief discussion on traveling with oxygen and resources the patient may utilize. Flowsheet Row PULMONARY REHAB OTHER RESPIRATORY from 12/15/2017 in [place]  Date 11/24/17  Educator Cloyde Reams  Instruction Review Code 1- Verbalizes Understanding  Oxygen Equipment:  -Group instruction provided by [facility] Staff utilizing handouts, written materials, and equipment demonstrations.   Signs and Symptoms:  -Group instruction provided by written material and verbal discussion to support subject matter. Warning signs and symptoms of infection, stroke, and heart attack are reviewed and when to call the physician/911 reinforced. Tips for preventing the spread of infection discussed. Flowsheet Row PULMONARY REHAB OTHER RESPIRATORY from 12/15/2017 in [place]  Date 09/01/17  Educator RN  Instruction Review Code 1- Verbalizes Understanding      Advanced Directives:  -Group instruction provided by verbal instruction and written material to support subject matter. Instructor reviews Advanced Directive laws and proper instruction for filling out document.   Pulmonary Video:  -Group video education that reviews the importance of medication and oxygen compliance, exercise, good nutrition, pulmonary hygiene, and pursed lip and diaphragmatic breathing for the pulmonary patient.   Exercise for the Pulmonary Patient:  -Group instruction that is supported by a PowerPoint presentation. Instructor discusses benefits of exercise, core components of exercise, frequency, duration, and intensity of an exercise routine, importance of utilizing pulse oximetry during exercise, safety while exercising, and options of places to exercise outside of rehab.   Flowsheet Row PULMONARY REHAB OTHER RESPIRATORY from 12/15/2017 in [place]  Date 09/22/17  Educator Cloyde Reams  Instruction Review Code 1- Verbalizes Understanding       Pulmonary Medications:  -Verbally interactive group education provided by instructor with focus on inhaled medications and proper administration. Flowsheet Row PULMONARY REHAB OTHER RESPIRATORY from 12/15/2017 in [place]  Date 11/01/17  Educator pharm      Anatomy and Physiology of the Respiratory System and Intimacy:  -Group instruction provided by PowerPoint, verbal discussion, and written material to support subject matter. Instructor reviews respiratory cycle and anatomical components of the respiratory system and their functions. Instructor also reviews differences in obstructive and restrictive respiratory diseases with examples of each. Intimacy, Sex, and Sexuality differences are reviewed with a discussion on how relationships can change when diagnosed with pulmonary disease. Common sexual concerns are reviewed. Flowsheet Row PULMONARY REHAB OTHER RESPIRATORY from 05/31/2019 in [place]  Date 05/03/19  Educator Handout      MD DAY -A group question and answer session with a medical doctor that allows participants to ask questions that relate to their pulmonary disease state. Flowsheet Row PULMONARY REHAB OTHER RESPIRATORY from 12/15/2017 in [place]  Date 12/15/17  Educator yacoub  Instruction Review Code 2- Demonstrated Understanding      OTHER EDUCATION -Group or individual verbal, written, or video instructions that support the educational goals of the pulmonary rehab program. Flowsheet Row PULMONARY REHAB OTHER RESPIRATORY from 05/31/2019 in [place]  Date 04/26/19  Educator DF  Gwyndolyn Kaufman your numbers]  Instruction Review Code 2- Demonstrated Understanding      Holiday Eating Survival Tips:  -Group instruction provided by PowerPoint slides, verbal discussion, and written materials to support subject matter. The instructor gives  patients tips, tricks, and techniques to help them not only survive but enjoy the holidays despite the onslaught of food that accompanies the holidays.   Knowledge Questionnaire Score:  Knowledge Questionnaire Score - 06/02/20 1134      Knowledge Questionnaire Score   Pre Score 17/18           Core Components/Risk Factors/Patient Goals at Admission:  Personal Goals and  Risk Factors at Admission - 06/02/20 1145      Core Components/Risk Factors/Patient Goals on Admission    Weight Management Obesity    Admit Weight 197 lb (89.4 kg)    Goal Weight: Long Term 185 lb (83.9 kg)    Improve shortness of breath with ADL's Yes    Intervention Provide education, individualized exercise plan and daily activity instruction to help decrease symptoms of SOB with activities of daily living.    Expected Outcomes Short Term: Improve cardiorespiratory fitness to achieve a reduction of symptoms when performing ADLs;Long Term: Be able to perform more ADLs without symptoms or delay the onset of symptoms           Core Components/Risk Factors/Patient Goals Review:   Goals and Risk Factor Review    Row Name 06/02/20 1146 06/10/20 0920           Core Components/Risk Factors/Patient Goals Review   Personal Goals Review Weight Management/Obesity;Develop more efficient breathing techniques such as purse lipped breathing and diaphragmatic breathing and practicing self-pacing with activity.;Increase knowledge of respiratory medications and ability to use respiratory devices properly.;Improve shortness of breath with ADL's Develop more efficient breathing techniques such as purse lipped breathing and diaphragmatic breathing and practicing self-pacing with activity.;Increase knowledge of respiratory medications and ability to use respiratory devices properly.;Improve shortness of breath with ADL's      Review -- Has not started program yet, will begin 06/12/2020.      Expected Outcomes -- See admission goals.              Core Components/Risk Factors/Patient Goals at Discharge (Final Review):   Goals and Risk Factor Review - 06/10/20 0920      Core Components/Risk Factors/Patient Goals Review   Personal Goals Review Develop more efficient breathing techniques such as purse lipped breathing and diaphragmatic breathing and practicing self-pacing with activity.;Increase knowledge of respiratory medications and ability to use respiratory devices properly.;Improve shortness of breath with ADL's    Review Has not started program yet, will begin 06/12/2020.    Expected Outcomes See admission goals.           ITP Comments:   Comments: ITP REVIEW Pt is making expected progress toward pulmonary rehab goals after completing 1 sessions. Recommend continued exercise, life style modification, education, and utilization of breathing techniques to increase stamina and strength and decrease shortness of breath with exertion.

## 2020-06-17 NOTE — Telephone Encounter (Signed)
Patient scheduled on 07/17/20 @ 2:30pm

## 2020-06-18 NOTE — Telephone Encounter (Signed)
Left VM for pt to call me back to schedule RHC

## 2020-06-19 ENCOUNTER — Other Ambulatory Visit: Payer: Self-pay

## 2020-06-19 ENCOUNTER — Encounter (HOSPITAL_COMMUNITY): Payer: Self-pay | Admitting: *Deleted

## 2020-06-19 ENCOUNTER — Encounter (HOSPITAL_COMMUNITY)
Admission: RE | Admit: 2020-06-19 | Discharge: 2020-06-19 | Disposition: A | Discharge status: Home / Self Care | Payer: BC Managed Care – PPO | Source: Ambulatory Visit | Attending: Internal Medicine | Admitting: Internal Medicine

## 2020-06-19 DIAGNOSIS — J849 Interstitial pulmonary disease, unspecified: Secondary | ICD-10-CM | POA: Diagnosis not present

## 2020-06-19 NOTE — Progress Notes (Signed)
Daily Session Note  Patient Details  Name: Tasha Ballard MRN: [id number] Date of Birth: 04/21/1970 Referring Provider:   Flowsheet Row Pulmonary Rehab Walk Test from 06/02/2020 in [facility]  Referring Provider Dr. Ramaswamy      Encounter Date: 06/19/2020  Check In:  Session Check In - 06/19/20 1156      Check-In   Supervising physician immediately available to respond to emergencies [facility] immediately available    Physician(s) Dr. Amery    Location [facility]    Staff Present Joan Behrens, RN, BSN; , RN;David Makemson, MS, ACSM-CEP, CCRP, Exercise Physiologist    Virtual Visit No    Medication changes reported     No    Fall or balance concerns reported    No    Tobacco Cessation No Change    Warm-up and Cool-down Performed on first and last piece of equipment    Resistance Training Performed Yes    VAD Patient? No    PAD/SET Patient? No      Pain Assessment   Currently in Pain? No/denies    Multiple Pain Sites No           Capillary Blood Glucose: No results found for this or any previous visit (from the past 24 hour(s)).    Social History   Tobacco Use  Smoking Status Never Smoker  Smokeless Tobacco Never Used    Goals Met:  Exercise tolerated well No report of cardiac concerns or symptoms Strength training completed today  Goals Unmet:  Not Applicable  Comments: Service time is from 1030 to 1128    Dr. Traci Turner is Medical Director for Cardiac Rehab at [facility]. 

## 2020-06-20 ENCOUNTER — Telehealth: Payer: Self-pay | Admitting: Internal Medicine

## 2020-06-20 ENCOUNTER — Ambulatory Visit (INDEPENDENT_AMBULATORY_CARE_PROVIDER_SITE_OTHER): Payer: BC Managed Care – PPO | Admitting: Internal Medicine

## 2020-06-20 ENCOUNTER — Encounter: Payer: Self-pay | Admitting: Internal Medicine

## 2020-06-20 VITALS — BP 120/80 | HR 101 | Ht 62.0 in | Wt 194.6 lb

## 2020-06-20 DIAGNOSIS — Z8616 Personal history of COVID-19: Secondary | ICD-10-CM

## 2020-06-20 DIAGNOSIS — G2581 Restless legs syndrome: Secondary | ICD-10-CM

## 2020-06-20 DIAGNOSIS — Z85118 Personal history of other malignant neoplasm of bronchus and lung: Secondary | ICD-10-CM

## 2020-06-20 DIAGNOSIS — J849 Interstitial pulmonary disease, unspecified: Secondary | ICD-10-CM

## 2020-06-20 DIAGNOSIS — J9611 Chronic respiratory failure with hypoxia: Secondary | ICD-10-CM | POA: Diagnosis not present

## 2020-06-20 DIAGNOSIS — Z5181 Encounter for therapeutic drug level monitoring: Secondary | ICD-10-CM

## 2020-06-20 DIAGNOSIS — N390 Urinary tract infection, site not specified: Secondary | ICD-10-CM

## 2020-06-20 DIAGNOSIS — Z79899 Other long term (current) drug therapy: Secondary | ICD-10-CM

## 2020-06-20 DIAGNOSIS — D849 Immunodeficiency, unspecified: Secondary | ICD-10-CM | POA: Diagnosis not present

## 2020-06-20 MED ORDER — ROPINIROLE HCL 0.25 MG PO TABS: 0.2500 mg | ORAL_TABLET | Freq: Every day | ORAL | 5 refills | Status: DC

## 2020-06-20 NOTE — Telephone Encounter (Signed)
[name],  Tasha Ballard is scheduled for right heart catheterization with you on Jun 25, 2020.  She has upcoming appointment with [facility] lung transplant program for initial intake evaluation Jun 30, 2020.  Typically I know transplant evaluation and also left heart catheterization to but I do not know if that would be the case and 50 year old.  Patient and I wondered if she should have a left heart catheterization at the same time you are doing the procedure [subject to either schedule issues].  Alternatively this could also be done [if required] depending on [facility]  Just out of bring it to attention.  Please do not hesitate to contact me if you have any questions  Thank you   MR

## 2020-06-20 NOTE — Telephone Encounter (Signed)
Cath scheduled.

## 2020-06-20 NOTE — H&P (View-Only) (Signed)
OV 07/27/2019 -   Subjective:  Patient ID: [name], female , DOB: 1970-04-20 , age 50 y.o. , MRN: [id number] , ADDRESS: [address] [place] [address]   07/27/2019 - telephone visit. Patient identifed with 2PHI   HPI VERENIS NICOSIA 50 y.o. - to discuss below results. rEsults given. She is very anxious and sad about the results     NM PET Image Initial (PI) Skull Base To Thigh  Result Date: 07/27/2019 CLINICAL DATA:  Initial treatment strategy for left lower lobe nodule. EXAM: NUCLEAR MEDICINE PET SKULL BASE TO THIGH TECHNIQUE: 11.7 mCi F-18 FDG was injected intravenously. Full-ring PET imaging was performed from the skull base to thigh after the radiotracer. CT data was obtained and used for attenuation correction and anatomic localization. Fasting blood glucose: 116 mg/dl COMPARISON:  CT chest dated 07/19/2019 FINDINGS: Mediastinal blood pool activity: SUV max 4.2 Liver activity: SUV max NA NECK: No hypermetabolic cervical lymphadenopathy. Incidental CT findings: none CHEST: 15 mm nodule in the medial left lower lobe (series 3/image 118), max SUV 7.7, compatible primary bronchogenic neoplasm. 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 No hypermetabolic thoracic lymphadenopathy. Incidental CT findings: Right chest port terminates the cavoatrial junction. Mild atherosclerotic calcifications of the aortic arch. ABDOMEN/PELVIS: No abnormal hypermetabolism in the liver, spleen, pancreas, or adrenal glands. No hypermetabolic abdominopelvic lymphadenopathy. Incidental CT findings: GDA embolization coil. SMA stent. Mild atherosclerotic calcifications of the abdominal aorta and branch vessels. SKELETON: No focal hypermetabolic activity to suggest skeletal metastasis. Incidental CT findings: Degenerative changes of the visualized thoracolumbar spine. IMPRESSION: 15 mm nodule in the medial left lower lobe, compatible  primary bronchogenic neoplasm. No findings suspicious for metastatic disease. Electronically Signed   By: Julian Hy M.D.   On: 07/27/2019 10:06    OV 09/11/2019   Subjective:  Patient ID: [name], female , DOB: February 18, 1971, age 69 y.o. years. , MRN: [id number],  ADDRESS: [address] [place] [address] PCP  [name], MD Providers : Treatment Team:  Attending Provider: Brand Males, MD   Chief Complaint  Patient presents with  . Follow-up    medication question    #Morbid Obesity - needs weight loss for transplant - sees Dr Janeal Holmes since dec 2019  - Lung transplant needed: Obesity Barrier :  Weight 231#  11/22/17  -> 221# 01/17/2018., -> 222# on 03/02/2018 -> 220# on 03/30/2018 -> 223# on 04/26/2018 -> 224# on 10/20/2018 -> 219# on 01/03/2019 -> 222#    #PRogressive NSIP #Chronic hypoxemic resp failure - 4L at rest, 8L with exertion in dec 2019 - dec 2020 6 to 8/12L # High risk treatment regimen  -CellCept 2013 through 2019  -Chronic prednisone 2013 -currently taking -Bactrim prophylaxis 2013 --currently taking  - Cytoxan Oral  36m per day 12/23/17 and increaed to 1060mon 12/23/17.On11/19/19. increase to 12519mer day   - Cytoxan IV : And switched to IV cytoxan Q4 week cycle   - 1st cycle 02/03/18 (s/p portocath 01/26/18)  = 2nd cycle 03/06/2018  - 3rd cycle  -  04/03/2018  - 4th cycle - 05/01/2018 - Cytoxan oral (due to pandemic covid-19 and need to avoid infusion visits) - spring 2020 - BAck on IV Cytoxan -   - 5th cycle - end sept 2020  - 6th cycle - end oct 2020  - 7th  Cycle - end nov 2020 (full dose) 01/17/2019 -. Stopped -  Immuran  0 approx jan 2021  - Rehab + spring 2021 onwauys    - Dickey Gave 01/23/18 (approximnate) - Esbriet -early-mid April 2021  #Nodule of lower lobe of left lung - new march 2021  - biodesk negative  - enarlged to  1.5cm may 20201 - PET HOT - empiric XRT    HPI DAKSHA KOONE 50 y.o. -returns for follow-up  with her husband [name].  She is now on Imuran twice daily.  She is also taking nintedanib and full dose Esbriet for her ILD.  She feels stable.  At rest she is between 4 and 6 L.  When she works out she uses 8-12 L.  This is stable.  She has lost 15 pounds of weight.  For lung nodule she is finished radiation ending early July 2021.  She did have some side effects in the throat because of this.  She also has altered taste which she believes could be because of the Esbriet and Metformin.  Nevertheless overall she is tolerating the drugs fine.  She is happy about her weight loss.  She and her husband wanted know if hyperbaric oxygen will help her.  I have reached out to some colleagues through the pulmonary fibrosis foundation to figure this out.  She is agreeable to removing her central line Port-A-Cath which is currently not being used.  She also indicated to me that when she took probiotic along with antibiotics recently this helped reset her diarrhea.  She wants to know if she should take probiotics on a regular basis.  Discussed with Dr. Collene Mares about her personal professional opinion about this.  Echo March 2021: Normal RV function.  Grade 1 diastolic dysfunction.  PA pressures not calculated.   ROS - per HPI   OV 04/15/2020  Subjective:  Patient ID: [name], female , DOB: 1970/11/24 , age 9 y.o. , MRN: [id number] , ADDRESS: [place] [id number] PCP [name], MD Patient Care Team: [name], MD as PCP - General (Family Medicine)  This Provider for this visit: Treatment Team:  Attending Provider: Brand Males, MD    04/15/2020 -   Chief Complaint  Patient presents with  . Follow-up    Drop in stats with exertion, constant cough    #Morbid Obesity - needs weight loss for transplant - sees Dr Janeal Holmes since dec 2019  - Lung transplant needed: Obesity Barrier :  Weight 231#  11/22/17  -> 221# 01/17/2018., -> 222# on 03/02/2018 -> 220# on  03/30/2018 -> 223# on 04/26/2018 -> 224# on 10/20/2018 -> 219# on 01/03/2019 -> 222#    #PRogressive NSIP #Chronic hypoxemic resp failure - 4L at rest, 8L with exertion in dec 2019 - dec 2020 6 to 8/12L # High risk treatment regimen  -CellCept 2013 through 2019  -Chronic prednisone 2013 -currently taking -Bactrim prophylaxis 2013 --currently taking  - Cytoxan Oral  23m per day 12/23/17 and increaed to 1010mon 12/23/17.On11/19/19. increase to 12529mer day   - Cytoxan IV : And switched to IV cytoxan Q4 week cycle   - 1st cycle 02/03/18 (s/p portocath 01/26/18)  = 2nd cycle 03/06/2018  - 3rd cycle  -  04/03/2018  - 4th cycle - 05/01/2018 - Cytoxan oral (due to pandemic covid-19 and need to avoid infusion visits) - spring 2020 - BAck on IV Cytoxan -   - 5th cycle - end sept 2020  - 6th cycle - end oct 2020  - 7th  Cycle -  end nov 2020 (full dose) 01/17/2019 -. Stopped - Immuran  - approx jan 2021  - Rehab + spring 2021 onwauys    - Ofev 01/23/18 (approximnate) - Esbriet -early-mid April 2021  #Nodule of lower lobe of left lung - new march 2021  - biodesk negative  - enarlged to  1.5cm may 20201 - PET HOT - empiric XRT  #Positive history of COVID-19 O micron 04/03/2020  -  Sotrovimab infusion 04/03/20   HPI NOLA BOTKINS 50 y.o. -presents for follow-up.  She is on dual antifibrotic therapy along with Imuran for her NSIP and chronic hypoxemic respiratory failure.  On 04/03/2020 following her outbreak within her family she ended up getting COVID-19 omicron infection.  She previously tested negative with an rapid antigen testing but given high suspicion Patricia Nettle our nurse practitioner tested PCR and was positive.  She then underwent monoclonal antibody neutralizing infusion 04/03/2020.  A week after that on 04/11/2020 she presented with to nurse practitioner because she was still feeling significantly symptomatic and miserable with worsening cough.  She had a chest x-ray that showed  showed fibrotic changes and was noncontributory.  I reviewed the records for this.  I visualized the chest x-ray.  Nurse practitioner asked the patient to take Z-Pak order completed.  Also give prednisone taper over the following week which is this week.  Give saline nasal spray.  Patient is here as a follow-up from the 04/11/2020 visit.  Overall her symptom profile is significantly deteriorated.  She tells me she is requiring 10-12 L of oxygen and even with that she is desaturating.  Normally even at 6 or 8 L she can go to the bathroom and not have a problem.  Currently with that level of oxygen she is desaturating into the 70s.  She is not on any anticoagulations denies any edema.  She has had some panic attacks where she got significantly short of breath.  This has been scary for her.  Currently nintedanib, pirfenidone and her Imuran are on hold for the last 3 to 4 days.  She is also coughing way more than usual.  She is asking for palliative relief of cough.  Last right heart catheterization was a few years ago at [facility] and she did not have pulmonary hypertension.   Of note she was going to participate in a pulse inhaled nitric oxide study and this is currently on hold.  PFT  PFT Results Latest Ref Rng & Units 01/25/2020 11/20/2018 03/30/2018  FVC-Pre L 1.15 1.16 1.09  FVC-Predicted Pre % 35 35 32  Pre FEV1/FVC % % 92 91 93  FEV1-Pre L 1.06 1.06 1.01  FEV1-Predicted Pre % 40 40 38  DLCO uncorrected ml/min/mmHg 5.44 7.29 6.85  DLCO UNC% % 27 37 34  DLCO corrected ml/min/mmHg 11.78 7.45 -  DLCO COR %Predicted % 60 37 -  DLVA Predicted % 150 93 92     OV 05/14/2020  Subjective:  Patient ID: [name], female , DOB: 12-29-1970 , age 41 y.o. , MRN: [id number] , ADDRESS: [place] [id number] PCP [name], MD Patient Care Team: [name], MD as PCP - General (Family Medicine)  This Provider for this visit: Treatment Team:  Attending Provider:  Brand Males, MD    #Morbid Obesity - needs weight loss for transplant - sees Dr Janeal Holmes since dec 2019  - Lung transplant needed: Obesity Barrier :  Weight 231#  11/22/17  -> 221# 01/17/2018., -> 222#  on 03/02/2018 -> 220# on 03/30/2018 -> 223# on 04/26/2018 -> 224# on 10/20/2018 -> 219# on 01/03/2019 -> 222# -> on 05/14/2020\ 197#    #PRogressive NSIP #Chronic hypoxemic resp failure - 4L at rest, 8L with exertion in dec 2019 - dec 2020 6 to 8/12L # High risk treatment regimen  -CellCept 2013 through 2019  -Chronic prednisone 2013 -currently taking -Bactrim prophylaxis 2013 --currently taking  - Cytoxan Oral  57m per day 12/23/17 and increaed to 109mon 12/23/17.On11/19/19. increase to 12513mer day   - Cytoxan IV : And switched to IV cytoxan Q4 week cycle   - 1st cycle 02/03/18 (s/p portocath 01/26/18 - dec 2021)  = 2nd cycle 03/06/2018  - 3rd cycle  -  04/03/2018  - 4th cycle - 05/01/2018 - Cytoxan oral (due to pandemic covid-19 and need to avoid infusion visits) - spring 2020 - BAck on IV Cytoxan -   - 5th cycle - end sept 2020  - 6th cycle - end oct 2020  - 7th  Cycle - end nov 2020 (full dose) 01/17/2019 -. Stopped - Immuran  - approx jan 2021  - Rehab + spring 2021 onwauys    - Ofev 01/23/18 (approximnate) - Esbriet -early-mid April 2021  #Nodule of lower lobe of left lung - new march 2021  - biodesk negative  - enarlged to  1.5cm may 20201 - PET HOT - empiric XRT  #Positive history of COVID-19 O micron 04/03/2020  -  Sotrovimab infusion 04/03/20    05/14/2020 -   Chief Complaint  Patient presents with  . Follow-up    Pt states that her breathing has been worse since she had Covid. States she is now requiring more oxygen. States still has an occ cough.     HPI ChrWILLIA GENRICH 52o. -returns for follow-up.  Since her last visit she has not improved.  She had COVID in February 2022.  After that she had increasing oxygen needs.  This has not improved at  all.  She tells me that with 8 L at rest when she goes to the bathroom and comes back she feels dizzy and is 70%.  She is requiring 12-15 L with exertion at home.  There is a way worse than her baseline.  She lost weight into the 180s and now again into the 190s.  Nevertheless it still below her standard 220 pounds.  This is good news but her BMI is still extremely high precluding a transplant.  She continues on pirfenidone, nintedanib and Imuran with prednisone.  She is also telling that she is easily tachycardic.  She is on Coreg for this for many years.  She says this is not working though.  She took her husband's fast acting Lopressor 5 mg and this seemed to help her.  She is asking for a prescription of this.  At last visit we referred her to cardiology Dr. MclDaleen Squibbr right heart catheterization but she says she has not heard from the office yet.  Her husband sees Dr. DavGlenetta Hewd she is willing to see him.  We discussed lung transplant.  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  [facility] this was all pre-Covid and her weight was up area.  [facility].  She is undergoing pulmonary rehabilitation on  the maintenance phase.  She tells me that they want to converted to the acute phase..  She is on the ILD pro registry study.   CT Chest data  No results found. PFT  PFT Results Latest Ref Rng & Units 01/25/2020 11/20/2018 03/30/2018  FVC-Pre L 1.15 1.16 1.09  FVC-Predicted Pre % 35 35 32  Pre FEV1/FVC % % 92 91 93  FEV1-Pre L 1.06 1.06 1.01  FEV1-Predicted Pre % 40 40 38  DLCO uncorrected ml/min/mmHg 5.44 7.29 6.85  DLCO UNC% % 27 37 34  DLCO corrected ml/min/mmHg 11.78 7.45 -  DLCO COR %Predicted % 60 37 -  DLVA Predicted % 150 93 92      OV 06/20/2020  Subjective:  Patient ID: [name], female  , DOB: [birth year] , age 84 y.o. , MRN: [id number] , ADDRESS: [place] [id number] PCP [name], MD Patient Care Team: [name], MD as PCP - General (Family Medicine) Larey Dresser, MD as PCP - Advanced Heart Failure (Cardiology)  This Provider for this visit: Treatment Team:  Attending Provider: Brand Males, MD    06/20/2020 -   Chief Complaint  Patient presents with  . Follow-up    Pt states she has been doing better since last visit. States today she has been wheezing some but other than that she has been doing better.     #Morbid Obesity - needs weight loss for transplant - sees Dr Janeal Holmes since dec 2019  - Lung transplant needed: Obesity Barrier :  Weight 231#  11/22/17  -> 221# 01/17/2018., -> 222# on 03/02/2018 -> 220# on 03/30/2018 -> 223# on 04/26/2018 -> 224# on 10/20/2018 -> 219# on 01/03/2019 -> 222# -> on 05/14/2020\ 197# -> 194# on 06/20/2020     #PRogressive NSIP #Chronic hypoxemic resp failure - 4L at rest, 8L with exertion in dec 2019 - dec 2020 6 to 8/12L # High risk treatment regimen  -CellCept 2013 through 2019  -Chronic prednisone 2013 -currently taking -Bactrim prophylaxis 2013 --currently taking  - Cytoxan Oral  38m per day 12/23/17 and increaed to 1074mon 12/23/17.On11/19/19. increase to 12529mer day   - Cytoxan IV : And switched to IV cytoxan Q4 week cycle   - 1st cycle 02/03/18 (s/p portocath 01/26/18 - dec 2021)  = 2nd cycle 03/06/2018  - 3rd cycle  -  04/03/2018  - 4th cycle - 05/01/2018 - Cytoxan oral (due to pandemic covid-19 and need to avoid infusion visits) - spring 2020 - BAck on IV Cytoxan -   - 5th cycle - end sept 2020  - 6th cycle - end oct 2020  - 7th  Cycle - end nov 2020 (full dose) 01/17/2019 -. Stopped - Immuran  - approx jan 2021  - Rehab + spring 2021 onwauys    - Ofev 01/23/18 (approximnate) - Esbriet -early-mid April 2021  #Nodule of lower lobe of left lung - new march 2021 -presumed  lung cancer - biodesk negative  - enarlged to  1.5cm may 20201 - PET HOT - empiric XRT -Last CT December 2021 and some postradiation consolidation  #7 mm left upper lobe nodule on observation -stable between August 2021 and December 2021  #Positive history of COVID-19 O micron 04/03/2020  -  Sotrovimab infusion 04/03/20  #Recurrent UTI -3 episodes in 2021 including hospitalization -1 episode in 2022  HPI ChrWILBERT SCHOUTEN 51o. -presents for follow-up with her husband.  This is for interstitial lung disease.  She is immunosuppressed on on Imuran, prednisone with Bactrim prophylaxis 2 antifibrotic's and requires high amount level of monitoring.  She is high complicated patient.  She got worse after COVID but now she says she is improving and symptoms are improving and settling back down to requiring 10 L with exertion.  This is a slow return to baseline.  She continues to lose weight in anticipation of lung transplant.  She has a first [facility] transplant lung evaluation Jun 30, 2020.  She is scheduled for right heart catheterization next week with Dr. Dr. Aundra Dubin on Jun 25, 2020.  She continues have recurrent UTI.  She had 3 last year.  This year she has had 1.  She has upcoming appointment with urology.  At this visit her symptoms are better oxygen need is better.  She is asking if she needs a left heart catheterization as well in anticipation of transplant appointment.  She is also reporting that she has chronic restless leg that is quite significant.  She is asking for medication.  Her physical therapist recommended Requip.  I discussed this with her.  Including side effects of dizziness hypotension and GI issues and also increased impulsive behavior.  She is fine with all the side effects and she wants to take this medication.  She prefers this to gabapentin because gabapentin might interfere with transplant.     SYMPTOM SCALE - ILD 03/30/2018  04/26/2018  10/20/2018  01/03/2019  02/08/2019   04/15/2020  05/14/2020  06/20/2020 194#  O2 use  4 L at rest, 8 L with exertion Same but prefers 6L at all times 6L  6L at rest, 8-10 L with exertion 6L rest -> 93% Was on 8-10L with exertion. Now needing 12l  + with exertion and is post covid    Shortness of Breath 0 -> 5 scale with 5 being worst (score 6 If unable to do)         At rest 0 1 0 0 0 2  0  Simple tasks - showers, clothes change, eating, shaving 1 3.5 3.5 3._0 Household (dishes, doing bed, laundry) 2.5 4.5 3._1 Shopping 3 4.5 3._2 x  Walking level at own pace 3 3.5 3.5 3._3 Walking up Stairs 4 5 4._4 Total  Dyspnea Score 13.5 22 19._5 How bad is your cough? x 2.5 x mild worse 4 bettter 2  How bad is your fatigue 5 ("very, lol" 4 Extreme 5+ "lot" awful 4 awful 3  nausea      2 0 1  vomit      2 0 0  dirahhea      0 0 1  anxiety      Just with panic When out of bed 2  depression      0 worried 1         PFT  PFT Results Latest Ref Rng & Units 01/25/2020 11/20/2018 03/30/2018  FVC-Pre L 1.15 1.16 1.09  FVC-Predicted Pre % 35 35 32  Pre FEV1/FVC % % 92 91 93  FEV1-Pre L 1.06 1.06 1.01  FEV1-Predicted Pre % 40 40 38  DLCO uncorrected ml/min/mmHg 5.44 7.29 6.85  DLCO UNC% % 27 37 34  DLCO corrected ml/min/mmHg 11.78 7.45 -  DLCO COR %Predicted % 60 37 -  DLVA Predicted % 150 93 92  has a past medical history of Aneurysm ([place]), Back pain, Cancer ([place]), Diabetes mellitus without complication ([place]), H/O blood clots, Hypertension, Joint pain, Pulmonary fibrosis ([place]), SOB (shortness of breath), Swelling, Thyroid disease, and Vitamin D deficiency.   reports that she has never smoked. She has never used smokeless tobacco.  Past Surgical History:  Procedure Laterality Date  . ABLATION    . IR IMAGING GUIDED PORT INSERTION  01/26/2018  . IR REMOVAL TUN ACCESS W/ PORT W/O FL MOD SED  02/19/2020  . KNEE SURGERY    . LUNG BIOPSY      Allergies  Allergen  Reactions  . Macrobid [Nitrofurantoin Macrocrystal] Nausea And Vomiting  . Adhesive [Tape] Rash    Immunization History  Administered Date(s) Administered  . Fluad Quad(high Dose 65+) 12/28/2019  . Influenza,inj,Quad PF,6+ Mos 12/24/2014, 02/05/2016, 01/07/2017, 11/22/2017, 10/20/2018  . Influenza-Unspecified 12/24/2014, 02/05/2016, 01/07/2017, 11/22/2017  . PFIZER(Purple Top)SARS-COV-2 Vaccination 04/06/2019, 04/30/2019, 10/18/2019  . Pneumococcal Conjugate-13 07/20/2017  . Pneumococcal Polysaccharide-23 06/09/2012, 01/03/2019  . Tdap 02/19/2011    Family History  Problem Relation Age of Onset  . Heart failure Mother   . Heart disease Mother   . Rheumatologic disease Father      Current Outpatient Medications:  .  azaTHIOprine (IMURAN) 50 MG tablet, TAKE 2 TABLETS (100MG) BY MOUTH EVERY DAY, Disp: 180 tablet, Rfl: 1 .  benzonatate (TESSALON) 200 MG capsule, Take 1 capsule (200 mg total) by mouth 3 (three) times daily as needed for cough., Disp: 30 capsule, Rfl: 1 .  carvedilol (COREG) 6.25 MG tablet, Take 6.25 mg by mouth 2 (two) times daily with a meal., Disp: , Rfl:  .  famotidine (PEPCID) 40 MG tablet, TAKE 1 TABLET BY MOUTH EVERY DAY, Disp: 90 tablet, Rfl: 3 .  folic acid (FOLVITE) 1 MG tablet, Take 1 tablet (1 mg total) by mouth daily., Disp: 30 tablet, Rfl: 5 .  furosemide (LASIX) 40 MG tablet, Take 40 mg by mouth See admin instructions. Take 1 tablet (40 mg) daily, may repeat dose if needed for fluid retention., Disp: , Rfl:  .  gabapentin (NEURONTIN) 100 MG capsule, Take 1 capsule (100 mg total) by mouth at bedtime as needed (For pain)., Disp: 30 capsule, Rfl: 5 .  metoprolol tartrate (LOPRESSOR) 25 MG tablet, Take 1 tablet (25 mg total) by mouth as needed (tachycardia)., Disp: 30 tablet, Rfl: 3 .  montelukast (SINGULAIR) 10 MG tablet, TAKE 1 TABLET BY MOUTH EVERY DAY AFTER SUPPER (Patient taking differently: Take 10 mg by mouth daily after supper.), Disp: 90 tablet, Rfl:  1 .  nystatin ointment (MYCOSTATIN), Apply topically 2 (two) times daily., Disp: 30 g, Rfl: 0 .  OFEV 150 MG CAPS, TAKE 1 CAPSULE TWICE A DAY (12 HOURS APART) WITH FOOD (Patient taking differently: Take 150 mg by mouth in the morning and at bedtime.), Disp: 60 capsule, Rfl: 11 .  ondansetron (ZOFRAN) 8 MG tablet, TAKE 1 TABLET BY MOUTH EVERY 8 HOURS AS NEEDED FOR NAUSEA AND VOMITING (Patient taking differently: Take 8 mg by mouth every 8 (eight) hours as needed for nausea or vomiting.), Disp: 45 tablet, Rfl: 2 .  phenazopyridine (PYRIDIUM) 200 MG tablet, Take 1 tablet (200 mg total) by mouth 3 (three) times daily as needed for pain., Disp: 9 tablet, Rfl: 0 .  Pirfenidone 801 MG TABS, TAKE 1 TABLET BY MOUTH 3 TIMES DAILY WITH MEALS (Patient taking differently: Take 801 mg by mouth 3 (three) times daily with meals.), Disp: 90 tablet, Rfl: 11 .  potassium chloride (KLOR-CON) 10 MEQ tablet, TAKE 3 TABLETS BY MOUTH DAILY, Disp: 90 tablet, Rfl: 2 .  predniSONE (DELTASONE) 10 MG tablet, Take 1 tablet (10 mg total) by mouth daily., Disp: , Rfl:  .  spironolactone (ALDACTONE) 100 MG tablet, Take 100 mg by mouth daily., Disp: , Rfl:  .  sulfamethoxazole-trimethoprim (BACTRIM) 400-80 MG tablet, Take 1 tablet by mouth every Monday, Wednesday, and Friday., Disp: , Rfl:  .  thyroid (ARMOUR) 90 MG tablet, Take 90 mg by mouth daily before breakfast. , Disp: , Rfl:  .  Vitamin D, Ergocalciferol, (DRISDOL) 1.25 MG (50000 UT) CAPS capsule, TAKE 1 CAPSULE BY MOUTH ONCE WEEKLY (Patient taking differently: Take 50,000 Units by mouth every Saturday.), Disp: 3 capsule, Rfl: 0 .  chlorpheniramine-HYDROcodone (TUSSIONEX PENNKINETIC ER) 10-8 MG/5ML SUER, Take 5 mLs by mouth 2 (two) times daily. (Patient not taking: Reported on 06/20/2020), Disp: 240 mL, Rfl: 0      Objective:   Vitals:   06/20/20 0934  BP: 120/80  Pulse: (!) 101  SpO2: 97%  Weight: 194 lb 9.6 oz (88.3 kg)  Height: _0  (1.575 m)    Estimated body  mass index is 35.59 kg/m as calculated from the following:   Height as of this encounter: _1  (1.575 m).   Weight as of this encounter: 194 lb 9.6 oz (88.3 kg).  _2 @  [facility]   06/20/20 0934  Weight: 194 lb 9.6 oz (88.3 kg)     Physical Exam General: No distress. Obese  Neuro: Alert and Oriented x 3. GCS 15. Speech normal Psych: Pleasant Resp:  Barrel Chest - no.  Wheeze - no, Crackles - yes at baes, No overt respiratory distress CVS: Normal heart sounds. Murmurs - no Ext: Stigmata of Connective Tissue Disease - no HEENT: Normal upper airway. PEERL +. No post nasal drip        Assessment:       ICD-10-CM   1. Chronic respiratory failure with hypoxia (HCC)  J96.11   2. ILD (interstitial lung disease) ([place])  J84.9   3. Immunosuppressed status ([place])  D84.9   4. Personal history of COVID-19  Z86.16   5. Encounter for therapeutic drug monitoring  Z51.81   6. High risk medication use  Z79.899   7. History of lung cancer  Z85.118   8. Restless leg syndrome  G25.81   9. Recurrent UTI  N39.0        Plan:     Patient Instructions     ICD-10-CM   1. Chronic respiratory failure with hypoxia (HCC)  J96.11   2. ILD (interstitial lung disease) ([place])  J84.9   3. Immunosuppressed status ([place])  D84.9   4. Personal history of COVID-19  Z86.16   5. Encounter for therapeutic drug monitoring  Z51.81   6. High risk medication use  Z79.899   7. History of lung cancer  Z85.118   8. Restless leg syndrome  G25.81   9. 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  We took a shared decision making to start this drug because of significant restless leg -Continue oxygen with goal pulse ox greater than 88% -Continue  Esbriet and Ofev -Continue  Imuran  -Contiunue daily prednisone - continue prophylactic bactrim - Await Right heart cath 06/25/20 with Dr Aundra Dubin   - if abnormal will qualify for Tyvaso  -We will check with Dr. Loralie Champagne about also doing left heart catheterization in view of upcoming transplant appointment -Keep [facility] Lung transplant program from Jun 30, 2020 -Continue  lopressor 27m once daily as needed for tachycardia while continuing coreg for this -Continue Hycodan cough syrup 5 mL twice daily as needed for palliative relief of cough -Continue  pulmonary rehab for acute phase - cotninue weight loss -keep up the good work - cotnue ILD PRO -[facility]  Follow-up -Return to see Dr. RChase Callerin a 30-minute visit in 4-6 weeks   ( Level 05 visit: Estb 40-54 min   in  visit type: on-site physical face to visit  in total care time and counseling or/and coordination of care by this undersigned MD - Dr MBrand Males This includes one or more of the following on this same day 06/20/2020: pre-charting, chart review, note writing, documentation discussion of test results, diagnostic or treatment recommendations, prognosis, risks and benefits of management options, instructions, education, compliance or risk-factor reduction. It excludes time spent by the [facility] staff in  the care of the patient. Actual time 469min)   SIGNATURE    Dr. MBrand Males M.D., F.C.C.P,  Pulmonary and Critical Care Medicine Staff Physician, CCelebrationDirector - Interstitial Lung Disease  Program  [place]ulmonary [place] [id number] Pager: [phone] If no answer or between  15:00h - 7:00h: call [phone] Telephone: [phone]  10:12 AM 06/20/2020

## 2020-06-20 NOTE — Telephone Encounter (Signed)
sch for 5/4

## 2020-06-20 NOTE — Progress Notes (Signed)
OV 07/27/2019 -   Subjective:  Patient ID: Tasha Ballard, female , DOB: 1970-04-20 , age 50 y.o. , MRN: [id number] , ADDRESS: [address] [place] [address]   07/27/2019 - telephone visit. Patient identifed with 2PHI   HPI Tasha Ballard 50 y.o. - to discuss below results. rEsults given. She is very anxious and sad about the results     NM PET Image Initial (PI) Skull Base To Thigh  Result Date: 07/27/2019 CLINICAL DATA:  Initial treatment strategy for left lower lobe nodule. EXAM: NUCLEAR MEDICINE PET SKULL BASE TO THIGH TECHNIQUE: 11.7 mCi F-18 FDG was injected intravenously. Full-ring PET imaging was performed from the skull base to thigh after the radiotracer. CT data was obtained and used for attenuation correction and anatomic localization. Fasting blood glucose: 116 mg/dl COMPARISON:  CT chest dated 07/19/2019 FINDINGS: Mediastinal blood pool activity: SUV max 4.2 Liver activity: SUV max NA NECK: No hypermetabolic cervical lymphadenopathy. Incidental CT findings: none CHEST: 15 mm nodule in the medial left lower lobe (series 3/image 118), max SUV 7.7, compatible primary bronchogenic neoplasm. 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 No hypermetabolic thoracic lymphadenopathy. Incidental CT findings: Right chest port terminates the cavoatrial junction. Mild atherosclerotic calcifications of the aortic arch. ABDOMEN/PELVIS: No abnormal hypermetabolism in the liver, spleen, pancreas, or adrenal glands. No hypermetabolic abdominopelvic lymphadenopathy. Incidental CT findings: GDA embolization coil. SMA stent. Mild atherosclerotic calcifications of the abdominal aorta and branch vessels. SKELETON: No focal hypermetabolic activity to suggest skeletal metastasis. Incidental CT findings: Degenerative changes of the visualized thoracolumbar spine. IMPRESSION: 15 mm nodule in the medial left lower lobe, compatible  primary bronchogenic neoplasm. No findings suspicious for metastatic disease. Electronically Signed   By: Tasha Ballard M.D.   On: 07/27/2019 10:06    OV 09/11/2019   Subjective:  Patient ID: Tasha Ballard, female , DOB: February 18, 1971, age 50 y.o. years. , MRN: [id number],  ADDRESS: [address] [place] [address] PCP  Tasha Maid, MD Providers : Treatment Team:  Attending Provider: Brand Males, MD   Chief Complaint  Patient presents with  . Follow-up    medication question    #Morbid Obesity - needs weight loss for transplant - sees Dr Tasha Ballard since dec 2019  - Lung transplant needed: Obesity Barrier :  Weight 231#  11/22/17  -> 221# 01/17/2018., -> 222# on 03/02/2018 -> 220# on 03/30/2018 -> 223# on 04/26/2018 -> 224# on 10/20/2018 -> 219# on 01/03/2019 -> 222#    #PRogressive NSIP #Chronic hypoxemic resp failure - 4L at rest, 8L with exertion in dec 2019 - dec 2020 6 to 8/12L # High risk treatment regimen  -CellCept 2013 through 2019  -Chronic prednisone 2013 -currently taking -Bactrim prophylaxis 2013 --currently taking  - Cytoxan Oral  36m per day 12/23/17 and increaed to 1060mon 12/23/17.On11/19/19. increase to 12519mer day   - Cytoxan IV : And switched to IV cytoxan Q4 week cycle   - 1st cycle 02/03/18 (s/p portocath 01/26/18)  = 2nd cycle 03/06/2018  - 3rd cycle  -  04/03/2018  - 4th cycle - 05/01/2018 - Cytoxan oral (due to pandemic covid-19 and need to avoid infusion visits) - spring 2020 - BAck on IV Cytoxan -   - 5th cycle - end sept 2020  - 6th cycle - end oct 2020  - 7th  Cycle - end nov 2020 (full dose) 01/17/2019 -. Stopped -  Immuran  0 approx jan 2021  - Rehab + spring 2021 [facility]    - Dickey Gave 01/23/18 (approximnate) - Esbriet -early-mid April 2021  #Nodule of lower lobe of left lung - new march 2021  - biodesk negative  - enarlged to  1.5cm may 20201 - PET HOT - empiric XRT    HPI Tasha Ballard 50 y.o. -returns for follow-up  with her husband Tasha Ballard.  She is now on Imuran twice daily.  She is also taking nintedanib and full dose Esbriet for her ILD.  She feels stable.  At rest she is between 4 and 6 L.  When she works out she uses 8-12 L.  This is stable.  She has lost 15 pounds of weight.  For lung nodule she is finished radiation ending early July 2021.  She did have some side effects in the throat because of this.  She also has altered taste which she believes could be because of the Esbriet and Metformin.  Nevertheless overall she is tolerating the drugs fine.  She is happy about her weight loss.  She and her husband wanted know if hyperbaric oxygen will help her.  I have reached out to some colleagues through the pulmonary fibrosis foundation to figure this out.  She is agreeable to removing her central line Port-A-Cath which is currently not being used.  She also indicated to me that when she took probiotic along with antibiotics recently this helped reset her diarrhea.  She wants to know if she should take probiotics on a regular basis.  Discussed with Dr. Collene Mares about her personal professional opinion about this.  Echo March 2021: Normal RV function.  Grade 1 diastolic dysfunction.  PA pressures not calculated.   ROS - per HPI   OV 04/15/2020  Subjective:  Patient ID: Tasha Ballard, female , DOB: [birth year] , age 50 y.o. , MRN: [id number] , ADDRESS: [place] [phone] PCP Tasha Maid, MD Patient Care Team: Tasha Maid, MD as PCP - General (Family Medicine)  This Provider for this visit: Treatment Team:  Attending Provider: Brand Males, MD    04/15/2020 -   Chief Complaint  Patient presents with  . Follow-up    Drop in stats with exertion, constant cough    #Morbid Obesity - needs weight loss for transplant - sees Dr Tasha Ballard since dec 2019  - Lung transplant needed: Obesity Barrier :  Weight 231#  11/22/17  -> 221# 01/17/2018., -> 222# on 03/02/2018 -> 220# on  03/30/2018 -> 223# on 04/26/2018 -> 224# on 10/20/2018 -> 219# on 01/03/2019 -> 222#    #PRogressive NSIP #Chronic hypoxemic resp failure - 4L at rest, 8L with exertion in dec 2019 - dec 2020 6 to 8/12L # High risk treatment regimen  -CellCept 2013 through 2019  -Chronic prednisone 2013 -currently taking -Bactrim prophylaxis 2013 --currently taking  - Cytoxan Oral  74m per day 12/23/17 and increaed to 1044mon 12/23/17.On11/19/19. increase to 12593mer day   - Cytoxan IV : And switched to IV cytoxan Q4 week cycle   - 1st cycle 02/03/18 (s/p portocath 01/26/18)  = 2nd cycle 03/06/2018  - 3rd cycle  -  04/03/2018  - 4th cycle - 05/01/2018 - Cytoxan oral (due to pandemic covid-19 and need to avoid infusion visits) - spring 2020 - BAck on IV Cytoxan -   - 5th cycle - end sept 2020  - 6th cycle - end oct 2020  - 7th  Cycle -  end nov 2020 (full dose) 01/17/2019 -. Stopped - Immuran  - approx jan 2021  - Rehab + spring 2021 on[facility]    - Ofev 01/23/18 (approximnate) - Esbriet -early-mid April 2021  #Nodule of lower lobe of left lung - new march 2021  - biodesk negative  - enarlged to  1.5cm may 20201 - PET HOT - empiric XRT  #Positive history of COVID-19 O micron 04/03/2020  -  Sotrovimab infusion 04/03/20   HPI Tasha Ballard 50 y.o. -presents for follow-up.  She is on dual antifibrotic therapy along with Imuran for her NSIP and chronic hypoxemic respiratory failure.  On 04/03/2020 following her outbreak within her family she ended up getting COVID-19 omicron infection.  She previously tested negative with an rapid antigen testing but given high suspicion Patricia Nettle our nurse practitioner tested PCR and was positive.  She then underwent monoclonal antibody neutralizing infusion 04/03/2020.  A week after that on 04/11/2020 she presented with to nurse practitioner because she was still feeling significantly symptomatic and miserable with worsening cough.  She had a chest x-ray that showed  showed fibrotic changes and was noncontributory.  I reviewed the records for this.  I visualized the chest x-ray.  Nurse practitioner asked the patient to take Z-Pak order completed.  Also give prednisone taper over the following week which is this week.  Give saline nasal spray.  Patient is here as a follow-up from the 04/11/2020 visit.  Overall her symptom profile is significantly deteriorated.  She tells me she is requiring 10-12 L of oxygen and even with that she is desaturating.  Normally even at 6 or 8 L she can go to the bathroom and not have a problem.  Currently with that level of oxygen she is desaturating into the 70s.  She is not on any anticoagulations denies any edema.  She has had some panic attacks where she got significantly short of breath.  This has been scary for her.  Currently nintedanib, pirfenidone and her Imuran are on hold for the last 3 to 4 days.  She is also coughing way more than usual.  She is asking for palliative relief of cough.  Last right heart catheterization was a few years ago at [facility] and she did not have pulmonary hypertension.   Of note she was going to participate in a pulse inhaled nitric oxide study and this is currently on hold.  PFT  PFT Results Latest Ref Rng & Units 01/25/2020 11/20/2018 03/30/2018  FVC-Pre L 1.15 1.16 1.09  FVC-Predicted Pre % 35 35 32  Pre FEV1/FVC % % 92 91 93  FEV1-Pre L 1.06 1.06 1.01  FEV1-Predicted Pre % 40 40 38  DLCO uncorrected ml/min/mmHg 5.44 7.29 6.85  DLCO UNC% % 27 37 34  DLCO corrected ml/min/mmHg 11.78 7.45 -  DLCO COR %Predicted % 60 37 -  DLVA Predicted % 150 93 92     OV 05/14/2020  Subjective:  Patient ID: Tasha Ballard, female , DOB: [birth year] , age 58 y.o. , MRN: [id number] , ADDRESS: [place] [phone] PCP Tasha Maid, MD Patient Care Team: Tasha Maid, MD as PCP - General (Family Medicine)  This Provider for this visit: Treatment Team:  Attending Provider:  Brand Males, MD    #Morbid Obesity - needs weight loss for transplant - sees Dr Tasha Ballard since dec 2019  - Lung transplant needed: Obesity Barrier :  Weight 231#  11/22/17  -> 221# 01/17/2018., -> 222#  on 03/02/2018 -> 220# on 03/30/2018 -> 223# on 04/26/2018 -> 224# on 10/20/2018 -> 219# on 01/03/2019 -> 222# -> on 05/14/2020\ 197#    #PRogressive NSIP #Chronic hypoxemic resp failure - 4L at rest, 8L with exertion in dec 2019 - dec 2020 6 to 8/12L # High risk treatment regimen  -CellCept 2013 through 2019  -Chronic prednisone 2013 -currently taking -Bactrim prophylaxis 2013 --currently taking  - Cytoxan Oral  56m per day 12/23/17 and increaed to 1027mon 12/23/17.On11/19/19. increase to 12539mer day   - Cytoxan IV : And switched to IV cytoxan Q4 week cycle   - 1st cycle 02/03/18 (s/p portocath 01/26/18 - dec 2021)  = 2nd cycle 03/06/2018  - 3rd cycle  -  04/03/2018  - 4th cycle - 05/01/2018 - Cytoxan oral (due to pandemic covid-19 and need to avoid infusion visits) - spring 2020 - BAck on IV Cytoxan -   - 5th cycle - end sept 2020  - 6th cycle - end oct 2020  - 7th  Cycle - end nov 2020 (full dose) 01/17/2019 -. Stopped - Immuran  - approx jan 2021  - Rehab + spring 2021 onw[facility]ys    - Ofev 01/23/18 (approximnate) - Esbriet -early-mid April 2021  #Nodule of lower lobe of left lung - new march 2021  - biodesk negative  - enarlged to  1.5cm may 20201 - PET HOT - empiric XRT  #Positive history of COVID-19 O micron 04/03/2020  -  Sotrovimab infusion 04/03/20    05/14/2020 -   Chief Complaint  Patient presents with  . Follow-up    Pt states that her breathing has been worse since she had Covid. States she is now requiring more oxygen. States still has an occ cough.     HPI ChrSAHIAN KERNEY 69o. -returns for follow-up.  Since her last visit she has not improved.  She had COVID in February 2022.  After that she had increasing oxygen needs.  This has not improved at  all.  She tells me that with 8 L at rest when she goes to the bathroom and comes back she feels dizzy and is 70%.  She is requiring 12-15 L with exertion at home.  There is a way worse than her baseline.  She lost weight into the 180s and now again into the 190s.  Nevertheless it still below her standard 220 pounds.  This is good news but her BMI is still extremely high precluding a transplant.  She continues on pirfenidone, nintedanib and Imuran with prednisone.  She is also telling that she is easily tachycardic.  She is on Coreg for this for many years.  She says this is not working though.  She took her husband's fast acting Lopressor 5 mg and this seemed to help her.  She is asking for a prescription of this.  At last visit we referred her to cardiology Dr. MclDaleen Squibbr right heart catheterization but she says she has not heard from the office yet.  Her husband sees Dr. DavGlenetta Hewd she is willing to see him.  We discussed lung transplant.  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  [facility] this was all pre-Covid and her weight was up area.  She is [facility] also [facility].  She is undergoing pulmonary rehabilitation on  the maintenance phase.  She tells me that they want to converted to the acute phase..  She is on the ILD pro registry study.   CT Chest data  No results found. PFT  PFT Results Latest Ref Rng & Units 01/25/2020 11/20/2018 03/30/2018  FVC-Pre L 1.15 1.16 1.09  FVC-Predicted Pre % 35 35 32  Pre FEV1/FVC % % 92 91 93  FEV1-Pre L 1.06 1.06 1.01  FEV1-Predicted Pre % 40 40 38  DLCO uncorrected ml/min/mmHg 5.44 7.29 6.85  DLCO UNC% % 27 37 34  DLCO corrected ml/min/mmHg 11.78 7.45 -  DLCO COR %Predicted % 60 37 -  DLVA Predicted % 150 93 92      OV 06/20/2020  Subjective:  Patient ID: Tasha Ballard, female  , DOB: 05/03/1970 , age 29 y.o. , MRN: [id number] , ADDRESS: [place] [phone] PCP Tasha Maid, MD Patient Care Team: Tasha Maid, MD as PCP - General (Family Medicine) Larey Dresser, MD as PCP - Advanced Heart Failure (Cardiology)  This Provider for this visit: Treatment Team:  Attending Provider: Brand Males, MD    06/20/2020 -   Chief Complaint  Patient presents with  . Follow-up    Pt states she has been doing better since last visit. States today she has been wheezing some but other than that she has been doing better.     #Morbid Obesity - needs weight loss for transplant - sees Dr Tasha Ballard since dec 2019  - Lung transplant needed: Obesity Barrier :  Weight 231#  11/22/17  -> 221# 01/17/2018., -> 222# on 03/02/2018 -> 220# on 03/30/2018 -> 223# on 04/26/2018 -> 224# on 10/20/2018 -> 219# on 01/03/2019 -> 222# -> on 05/14/2020\ 197# -> 194# on 06/20/2020     #PRogressive NSIP #Chronic hypoxemic resp failure - 4L at rest, 8L with exertion in dec 2019 - dec 2020 6 to 8/12L # High risk treatment regimen  -CellCept 2013 through 2019  -Chronic prednisone 2013 -currently taking -Bactrim prophylaxis 2013 --currently taking  - Cytoxan Oral  19m per day 12/23/17 and increaed to 1058mon 12/23/17.On11/19/19. increase to 12520mer day   - Cytoxan IV : And switched to IV cytoxan Q4 week cycle   - 1st cycle 02/03/18 (s/p portocath 01/26/18 - dec 2021)  = 2nd cycle 03/06/2018  - 3rd cycle  -  04/03/2018  - 4th cycle - 05/01/2018 - Cytoxan oral (due to pandemic covid-19 and need to avoid infusion visits) - spring 2020 - BAck on IV Cytoxan -   - 5th cycle - end sept 2020  - 6th cycle - end oct 2020  - 7th  Cycle - end nov 2020 (full dose) 01/17/2019 -. Stopped - Immuran  - approx jan 2021  - Rehab + spring 2021 onwauys    - Ofev 01/23/18 (approximnate) - Esbriet -early-mid April 2021  #Nodule of lower lobe of left lung - new march 2021 -presumed  lung cancer - biodesk negative  - enarlged to  1.5cm may 20201 - PET HOT - empiric XRT -Last CT December 2021 and some postradiation consolidation  #7 mm left upper lobe nodule on observation -stable between August 2021 and December 2021  #Positive history of COVID-19 O micron 04/03/2020  -  Sotrovimab infusion 04/03/20  #Recurrent UTI -3 episodes in 2021 including hospitalization -1 episode in 2022  HPI ChrLILLIEMAE Ballard 52o. -presents for follow-up with her husband.  This is for interstitial lung disease.  She is immunosuppressed on on Imuran, prednisone with Bactrim prophylaxis 2 antifibrotic's and requires high amount level of monitoring.  She is high complicated patient.  She got worse after COVID but now she says she is improving and symptoms are improving and settling back down to requiring 10 L with exertion.  This is a slow return to baseline.  She continues to lose weight in anticipation of lung transplant.  She has a first [facility] lung evaluation Jun 30, 2020.  She is scheduled for right heart catheterization next week with Dr. Dr. Aundra Dubin on Jun 25, 2020.  She continues have recurrent UTI.  She had 3 last year.  This year she has had 1.  She has upcoming appointment with urology.  At this visit her symptoms are better oxygen need is better.  She is asking if she needs a left heart catheterization as well in anticipation of transplant appointment.  She is also reporting that she has chronic restless leg that is quite significant.  She is asking for medication.  Her physical therapist recommended Requip.  I discussed this with her.  Including side effects of dizziness hypotension and GI issues and also increased impulsive behavior.  She is fine with all the side effects and she wants to take this medication.  She prefers this to gabapentin because gabapentin might interfere with transplant.     SYMPTOM SCALE - ILD 03/30/2018  04/26/2018  10/20/2018  01/03/2019  02/08/2019   04/15/2020  05/14/2020  06/20/2020 194#  O2 use  4 L at rest, 8 L with exertion Same but prefers 6L at all times 6L  6L at rest, 8-10 L with exertion 6L rest -> 93% Was on 8-10L with exertion. Now needing 12l  + with exertion and is post covid    Shortness of Breath 0 -> 5 scale with 5 being worst (score 6 If unable to do)         At rest 0 1 0 0 0 2  0  Simple tasks - showers, clothes change, eating, shaving 1 3.5 3.5 3._0 Household (dishes, doing bed, laundry) 2.5 4.5 3._1 Shopping 3 4.5 3._2 x  Walking level at own pace 3 3.5 3.5 3._3 Walking up Stairs 4 5 4._4 Total  Dyspnea Score 13.5 22 19._5 How bad is your cough? x 2.5 x mild worse 4 bettter 2  How bad is your fatigue 5 ("very, lol" 4 Extreme 5+ "lot" awful 4 awful 3  nausea      2 0 1  vomit      2 0 0  dirahhea      0 0 1  anxiety      Just with panic When out of bed 2  depression      0 worried 1         PFT  PFT Results Latest Ref Rng & Units 01/25/2020 11/20/2018 03/30/2018  FVC-Pre L 1.15 1.16 1.09  FVC-Predicted Pre % 35 35 32  Pre FEV1/FVC % % 92 91 93  FEV1-Pre L 1.06 1.06 1.01  FEV1-Predicted Pre % 40 40 38  DLCO uncorrected ml/min/mmHg 5.44 7.29 6.85  DLCO UNC% % 27 37 34  DLCO corrected ml/min/mmHg 11.78 7.45 -  DLCO COR %Predicted % 60 37 -  DLVA Predicted % 150 93 92  has a past medical history of Aneurysm ([place]), Back pain, Cancer ([place]), Diabetes mellitus without complication ([place]), H/O blood clots, Hypertension, Joint pain, Pulmonary fibrosis ([place]), SOB (shortness of breath), Swelling, Thyroid disease, and Vitamin D deficiency.   reports that she has never smoked. She has never used smokeless tobacco.  Past Surgical History:  Procedure Laterality Date  . ABLATION    . IR IMAGING GUIDED PORT INSERTION  01/26/2018  . IR REMOVAL TUN ACCESS W/ PORT W/O FL MOD SED  02/19/2020  . KNEE SURGERY    . LUNG BIOPSY      Allergies  Allergen  Reactions  . Macrobid [Nitrofurantoin Macrocrystal] Nausea And Vomiting  . Adhesive [Tape] Rash    Immunization History  Administered Date(s) Administered  . Fluad Quad(high Dose 65+) 12/28/2019  . Influenza,inj,Quad PF,6+ Mos 12/24/2014, 02/05/2016, 01/07/2017, 11/22/2017, 10/20/2018  . Influenza-Unspecified 12/24/2014, 02/05/2016, 01/07/2017, 11/22/2017  . PFIZER(Purple Top)SARS-COV-2 Vaccination 04/06/2019, 04/30/2019, 10/18/2019  . Pneumococcal Conjugate-13 07/20/2017  . Pneumococcal Polysaccharide-23 06/09/2012, 01/03/2019  . Tdap 02/19/2011    Family History  Problem Relation Age of Onset  . Heart failure Mother   . Heart disease Mother   . Rheumatologic disease Father      Current Outpatient Medications:  .  azaTHIOprine (IMURAN) 50 MG tablet, TAKE 2 TABLETS (100MG) BY MOUTH EVERY DAY, Disp: 180 tablet, Rfl: 1 .  benzonatate (TESSALON) 200 MG capsule, Take 1 capsule (200 mg total) by mouth 3 (three) times daily as needed for cough., Disp: 30 capsule, Rfl: 1 .  carvedilol (COREG) 6.25 MG tablet, Take 6.25 mg by mouth 2 (two) times daily with a meal., Disp: , Rfl:  .  famotidine (PEPCID) 40 MG tablet, TAKE 1 TABLET BY MOUTH EVERY DAY, Disp: 90 tablet, Rfl: 3 .  folic acid (FOLVITE) 1 MG tablet, Take 1 tablet (1 mg total) by mouth daily., Disp: 30 tablet, Rfl: 5 .  furosemide (LASIX) 40 MG tablet, Take 40 mg by mouth See admin instructions. Take 1 tablet (40 mg) daily, may repeat dose if needed for fluid retention., Disp: , Rfl:  .  gabapentin (NEURONTIN) 100 MG capsule, Take 1 capsule (100 mg total) by mouth at bedtime as needed (For pain)., Disp: 30 capsule, Rfl: 5 .  metoprolol tartrate (LOPRESSOR) 25 MG tablet, Take 1 tablet (25 mg total) by mouth as needed (tachycardia)., Disp: 30 tablet, Rfl: 3 .  montelukast (SINGULAIR) 10 MG tablet, TAKE 1 TABLET BY MOUTH EVERY DAY AFTER SUPPER (Patient taking differently: Take 10 mg by mouth daily after supper.), Disp: 90 tablet, Rfl:  1 .  nystatin ointment (MYCOSTATIN), Apply topically 2 (two) times daily., Disp: 30 g, Rfl: 0 .  OFEV 150 MG CAPS, TAKE 1 CAPSULE TWICE A DAY (12 HOURS APART) WITH FOOD (Patient taking differently: Take 150 mg by mouth in the morning and at bedtime.), Disp: 60 capsule, Rfl: 11 .  ondansetron (ZOFRAN) 8 MG tablet, TAKE 1 TABLET BY MOUTH EVERY 8 HOURS AS NEEDED FOR NAUSEA AND VOMITING (Patient taking differently: Take 8 mg by mouth every 8 (eight) hours as needed for nausea or vomiting.), Disp: 45 tablet, Rfl: 2 .  phenazopyridine (PYRIDIUM) 200 MG tablet, Take 1 tablet (200 mg total) by mouth 3 (three) times daily as needed for pain., Disp: 9 tablet, Rfl: 0 .  Pirfenidone 801 MG TABS, TAKE 1 TABLET BY MOUTH 3 TIMES DAILY WITH MEALS (Patient taking differently: Take 801 mg by mouth 3 (three) times daily with meals.), Disp: 90 tablet, Rfl: 11 .  potassium chloride (KLOR-CON) 10 MEQ tablet, TAKE 3 TABLETS BY MOUTH DAILY, Disp: 90 tablet, Rfl: 2 .  predniSONE (DELTASONE) 10 MG tablet, Take 1 tablet (10 mg total) by mouth daily., Disp: , Rfl:  .  spironolactone (ALDACTONE) 100 MG tablet, Take 100 mg by mouth daily., Disp: , Rfl:  .  sulfamethoxazole-trimethoprim (BACTRIM) 400-80 MG tablet, Take 1 tablet by mouth every Monday, Wednesday, and Friday., Disp: , Rfl:  .  thyroid (ARMOUR) 90 MG tablet, Take 90 mg by mouth daily before breakfast. , Disp: , Rfl:  .  Vitamin D, Ergocalciferol, (DRISDOL) 1.25 MG (50000 UT) CAPS capsule, TAKE 1 CAPSULE BY MOUTH ONCE WEEKLY (Patient taking differently: Take 50,000 Units by mouth every Saturday.), Disp: 3 capsule, Rfl: 0 .  chlorpheniramine-HYDROcodone (TUSSIONEX PENNKINETIC ER) 10-8 MG/5ML SUER, Take 5 mLs by mouth 2 (two) times daily. (Patient not taking: Reported on 06/20/2020), Disp: 240 mL, Rfl: 0      Objective:   Vitals:   06/20/20 0934  BP: 120/80  Pulse: (!) 101  SpO2: 97%  Weight: 194 lb 9.6 oz (88.3 kg)  Height: _0  (1.575 m)    Estimated body  mass index is 35.59 kg/m as calculated from the following:   Height as of this encounter: _1  (1.575 m).   Weight as of this encounter: 194 lb 9.6 oz (88.3 kg).  _2 @  [facility] Weights   06/20/20 0934  Weight: 194 lb 9.6 oz (88.3 kg)     Physical Exam General: No distress. Obese  Neuro: Alert and Oriented x 3. GCS 15. Speech normal Psych: Pleasant Resp:  Barrel Chest - no.  Wheeze - no, Crackles - yes at baes, No overt respiratory distress CVS: Normal heart sounds. Murmurs - no Ext: Stigmata of Connective Tissue Disease - no HEENT: Normal upper airway. PEERL +. No post nasal drip        Assessment:       ICD-10-CM   1. Chronic respiratory failure with hypoxia (HCC)  J96.11   2. ILD (interstitial lung disease) ([place])  J84.9   3. Immunosuppressed status ([place])  D84.9   4. Personal history of COVID-19  Z86.16   5. Encounter for therapeutic drug monitoring  Z51.81   6. High risk medication use  Z79.899   7. History of lung cancer  Z85.118   8. Restless leg syndrome  G25.81   9. Recurrent UTI  N39.0        Plan:     Patient Instructions     ICD-10-CM   1. Chronic respiratory failure with hypoxia (HCC)  J96.11   2. ILD (interstitial lung disease) ([place])  J84.9   3. Immunosuppressed status ([place])  D84.9   4. Personal history of COVID-19  Z86.16   5. Encounter for therapeutic drug monitoring  Z51.81   6. High risk medication use  Z79.899   7. History of lung cancer  Z85.118   8. Restless leg syndrome  G25.81   9. 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  We took a shared decision making to start this drug because of significant restless leg -Continue oxygen with goal pulse ox greater than 88% -Continue  Esbriet and Ofev -Continue  Imuran  -Contiunue daily prednisone - continue prophylactic bactrim - Await Right heart cath 06/25/20 with Dr Aundra Dubin   - if abnormal will qualify for Tyvaso  -We will check with Dr. Loralie Champagne about also doing left heart catheterization in view of upcoming transplant appointment -Keep [facility] Lung transplant program from Jun 30, 2020 -Continue  lopressor 23m once daily as needed for tachycardia while continuing coreg for this -Continue Hycodan cough syrup 5 mL twice daily as needed for palliative relief of cough -Continue  pulmonary rehab for acute phase - cotninue weight loss -keep up the good work - cotnue ILD PRO -[facility]  Follow-up -Return to see Dr. RChase Callerin a 30-minute visit in 4-6 weeks   ( Level 05 visit: Estb 40-54 min   in  visit type: on-site physical face to visit  in total care time and counseling or/and coordination of care by this undersigned MD - Dr MBrand Ballard This includes one or more of the following on this same day 06/20/2020: pre-charting, chart review, note writing, documentation discussion of test results, diagnostic or treatment recommendations, prognosis, risks and benefits of management options, instructions, education, compliance or risk-factor reduction. It excludes time spent by the [facility] office staff in  the care of the patient. Actual time 487min)   SIGNATURE    Dr. MBrand Ballard M.D., F.C.C.P,  Pulmonary and Critical Care Medicine Staff Physician, [place]Director - Interstitial Lung Disease  Program  Pulmonary [place] [address] Pager: [phone] If no answer or between  15:00h - 7:00h: call [phone] Telephone: [phone]  10:12 AM 06/20/2020

## 2020-06-20 NOTE — Addendum Note (Signed)
Addended by: Lorretta Harp on: 06/20/2020 10:27 AM   Modules accepted: Orders

## 2020-06-20 NOTE — Patient Instructions (Addendum)
ICD-10-CM   1. Chronic respiratory failure with hypoxia (HCC)  J96.11   2. ILD (interstitial lung disease) ([place])  J84.9   3. Immunosuppressed status ([place])  D84.9   4. Personal history of COVID-19  Z86.16   5. Encounter for therapeutic drug monitoring  Z51.81   6. High risk medication use  Z79.899   7. History of lung cancer  Z85.118   8. Restless leg syndrome  G25.81   9. 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  We took a shared decision making to start this drug because of significant restless leg -Continue oxygen with goal pulse ox greater than 88% -Continue  Esbriet and Ofev -Continue  Imuran  -Contiunue daily prednisone - continue prophylactic bactrim - Await Right heart cath 06/25/20 with Dr Aundra Dubin   - if abnormal will qualify for Tyvaso  -We will check with Dr. Loralie Champagne about also doing left heart catheterization in view of upcoming transplant appointment -Keep [facility] Lung transplant program from Jun 30, 2020 -Continue  lopressor 61m once daily as needed for tachycardia while continuing coreg for this -Continue Hycodan cough syrup 5 mL twice daily as needed for palliative relief of cough -Continue  pulmonary rehab for acute phase - cotninue weight loss -keep up the good work - cotnue ILD PRO -[facility] -[facility]  Follow-up -Return to see Dr. RChase Callerin a 30-minute visit in 4-6 weeks

## 2020-06-22 DIAGNOSIS — I272 Pulmonary hypertension, unspecified: Secondary | ICD-10-CM

## 2020-06-22 HISTORY — DX: Pulmonary hypertension, unspecified: I27.20

## 2020-06-23 ENCOUNTER — Other Ambulatory Visit (HOSPITAL_COMMUNITY): Payer: Self-pay | Admitting: *Deleted

## 2020-06-23 ENCOUNTER — Other Ambulatory Visit (HOSPITAL_COMMUNITY)
Admission: RE | Admit: 2020-06-23 | Discharge: 2020-06-23 | Disposition: A | Discharge status: Home / Self Care | Payer: BC Managed Care – PPO | Source: Ambulatory Visit | Attending: Cardiology | Admitting: Cardiology

## 2020-06-23 DIAGNOSIS — Z8616 Personal history of COVID-19: Secondary | ICD-10-CM | POA: Diagnosis not present

## 2020-06-23 DIAGNOSIS — I2721 Secondary pulmonary arterial hypertension: Secondary | ICD-10-CM | POA: Diagnosis not present

## 2020-06-23 DIAGNOSIS — Z85118 Personal history of other malignant neoplasm of bronchus and lung: Secondary | ICD-10-CM | POA: Diagnosis not present

## 2020-06-23 DIAGNOSIS — I2510 Atherosclerotic heart disease of native coronary artery without angina pectoris: Secondary | ICD-10-CM | POA: Diagnosis not present

## 2020-06-23 DIAGNOSIS — J849 Interstitial pulmonary disease, unspecified: Secondary | ICD-10-CM

## 2020-06-23 DIAGNOSIS — D849 Immunodeficiency, unspecified: Secondary | ICD-10-CM | POA: Diagnosis not present

## 2020-06-23 DIAGNOSIS — Z79899 Other long term (current) drug therapy: Secondary | ICD-10-CM | POA: Diagnosis not present

## 2020-06-23 DIAGNOSIS — G2581 Restless legs syndrome: Secondary | ICD-10-CM | POA: Diagnosis not present

## 2020-06-23 DIAGNOSIS — Z01812 Encounter for preprocedural laboratory examination: Secondary | ICD-10-CM | POA: Insufficient documentation

## 2020-06-23 DIAGNOSIS — J9611 Chronic respiratory failure with hypoxia: Secondary | ICD-10-CM | POA: Diagnosis not present

## 2020-06-23 DIAGNOSIS — Z20822 Contact with and (suspected) exposure to covid-19: Secondary | ICD-10-CM | POA: Diagnosis not present

## 2020-06-24 ENCOUNTER — Other Ambulatory Visit (HOSPITAL_COMMUNITY): Payer: Self-pay

## 2020-06-24 ENCOUNTER — Encounter (HOSPITAL_COMMUNITY)
Admission: RE | Admit: 2020-06-24 | Discharge: 2020-06-24 | Disposition: A | Discharge status: Home / Self Care | Payer: BC Managed Care – PPO | Source: Ambulatory Visit | Attending: Internal Medicine | Admitting: Internal Medicine

## 2020-06-24 ENCOUNTER — Other Ambulatory Visit: Payer: Self-pay

## 2020-06-24 VITALS — Wt 196.7 lb

## 2020-06-24 DIAGNOSIS — J849 Interstitial pulmonary disease, unspecified: Secondary | ICD-10-CM | POA: Insufficient documentation

## 2020-06-24 LAB — SARS CORONAVIRUS 2 (TAT 6-24 HRS): SARS Coronavirus 2: NEGATIVE

## 2020-06-24 MED FILL — Pirfenidone Tab 801 MG: ORAL | 30 days supply | Qty: 90 | Fill #1 | Status: AC

## 2020-06-24 NOTE — Progress Notes (Signed)
Daily Session Note  Patient Details  Name: Tasha Ballard MRN: [id number] Date of Birth: 1970-12-16 Referring Provider:   April Manson Pulmonary Rehab Walk Test from 06/02/2020 in [place]  Referring Provider Dr. [name]      Encounter Date: 06/24/2020  Check In:  Session Check In - 06/24/20 1214      Check-In   Supervising physician immediately available to respond to emergencies [facility] immediately available    Physician(s) Dr. Kurtis Bushman    Location [facility]    Staff Present Rosebud Poles, RN, Isaac Laud, MS, ACSM-CEP, Exercise Physiologist;Annedrea Rosezella [place], RN, MHA;Carlette Wilber Oliphant, RN, BSN;Lisa Ysidro Evert, RN;David Truxton, MS, ACSM-CEP, CCRP, Exercise Physiologist;Meredith Rosana Hoes RD, LDN    Virtual Visit No    Medication changes reported     No    Fall or balance concerns reported    No    Tobacco Cessation No Change    Warm-up and Cool-down Performed on first and last piece of equipment    Resistance Training Performed No    VAD Patient? No    PAD/SET Patient? No      Pain Assessment   Currently in Pain? No/denies    Multiple Pain Sites No           Capillary Blood Glucose: Results for orders placed or performed during the hospital encounter of 06/23/20 (from the past 24 hour(s))  SARS CORONAVIRUS 2 (TAT 6-24 HRS) Nasopharyngeal Nasopharyngeal Swab     Status: None   Collection Time: 06/23/20  1:16 PM   Specimen: Nasopharyngeal Swab  Result Value Ref Range   SARS Coronavirus 2 NEGATIVE NEGATIVE     Exercise Prescription Changes - 06/24/20 1200      Response to Exercise   Blood Pressure (Admit) 124/80    Blood Pressure (Exercise) 110/74    Blood Pressure (Exit) 112/80    Heart Rate (Admit) 94 bpm    Heart Rate (Exercise) 115 bpm    Heart Rate (Exit) 102 bpm    Oxygen Saturation (Admit) 99 %    Oxygen Saturation (Exercise) 97 %    Oxygen Saturation (Exit) 99 %    Rating of Perceived  Exertion (Exercise) 12    Perceived Dyspnea (Exercise) 2    Duration Continue with 30 min of aerobic exercise without signs/symptoms of physical distress.    Intensity THRR unchanged      Progression   Progression Continue to progress workloads to maintain intensity without signs/symptoms of physical distress.      Resistance Training   Training Prescription Yes    Weight Red bands    Reps 10-15    Time 10 Minutes      Oxygen   Oxygen Continuous    Liters 10-15      Treadmill   MPH 1.6    Grade 0    Minutes 15      NuStep   Level 4    SPM 80    Minutes 15    METs 1.5           Social History   Tobacco Use  Smoking Status Never Smoker  Smokeless Tobacco Never Used    Goals Met:  Proper associated with RPD/PD & O2 Sat Exercise tolerated well Strength training completed today  Goals Unmet:  Not Applicable  Comments: Service time is from 1030 to 53    Dr. Fransico Him is Medical Director for Cardiac Rehab at [facility].

## 2020-06-25 ENCOUNTER — Ambulatory Visit (HOSPITAL_COMMUNITY)
Admission: RE | Disposition: A | Discharge status: Home / Self Care | Payer: Self-pay | Source: Home / Self Care | Attending: Cardiology

## 2020-06-25 ENCOUNTER — Encounter (HOSPITAL_COMMUNITY): Payer: Self-pay | Admitting: Cardiology

## 2020-06-25 ENCOUNTER — Ambulatory Visit (HOSPITAL_COMMUNITY)
Admission: RE | Admit: 2020-06-25 | Discharge: 2020-06-25 | Disposition: A | Discharge status: Home / Self Care | Payer: BC Managed Care – PPO | Attending: Cardiology | Admitting: Cardiology

## 2020-06-25 DIAGNOSIS — Z85118 Personal history of other malignant neoplasm of bronchus and lung: Secondary | ICD-10-CM | POA: Insufficient documentation

## 2020-06-25 DIAGNOSIS — J9611 Chronic respiratory failure with hypoxia: Secondary | ICD-10-CM | POA: Insufficient documentation

## 2020-06-25 DIAGNOSIS — I2721 Secondary pulmonary arterial hypertension: Secondary | ICD-10-CM | POA: Insufficient documentation

## 2020-06-25 DIAGNOSIS — J849 Interstitial pulmonary disease, unspecified: Secondary | ICD-10-CM | POA: Insufficient documentation

## 2020-06-25 DIAGNOSIS — I2510 Atherosclerotic heart disease of native coronary artery without angina pectoris: Secondary | ICD-10-CM | POA: Insufficient documentation

## 2020-06-25 DIAGNOSIS — Z8616 Personal history of COVID-19: Secondary | ICD-10-CM | POA: Insufficient documentation

## 2020-06-25 DIAGNOSIS — I272 Pulmonary hypertension, unspecified: Secondary | ICD-10-CM

## 2020-06-25 DIAGNOSIS — G2581 Restless legs syndrome: Secondary | ICD-10-CM | POA: Insufficient documentation

## 2020-06-25 DIAGNOSIS — Z20822 Contact with and (suspected) exposure to covid-19: Secondary | ICD-10-CM | POA: Insufficient documentation

## 2020-06-25 DIAGNOSIS — D849 Immunodeficiency, unspecified: Secondary | ICD-10-CM | POA: Insufficient documentation

## 2020-06-25 DIAGNOSIS — Z942 Lung transplant status: Secondary | ICD-10-CM

## 2020-06-25 DIAGNOSIS — Z79899 Other long term (current) drug therapy: Secondary | ICD-10-CM | POA: Insufficient documentation

## 2020-06-25 HISTORY — PX: RIGHT/LEFT HEART CATH AND CORONARY ANGIOGRAPHY: CATH118266

## 2020-06-25 LAB — POCT I-STAT EG7
Acid-Base Excess: 10 mmol/L — ABNORMAL HIGH (ref 0.0–2.0)
Acid-Base Excess: 11 mmol/L — ABNORMAL HIGH (ref 0.0–2.0)
Bicarbonate: 37.1 mmol/L — ABNORMAL HIGH (ref 20.0–28.0)
Bicarbonate: 38.3 mmol/L — ABNORMAL HIGH (ref 20.0–28.0)
Calcium, Ion: 1.15 mmol/L (ref 1.15–1.40)
Calcium, Ion: 1.18 mmol/L (ref 1.15–1.40)
HCT: 36 % (ref 36.0–46.0)
HCT: 36 % (ref 36.0–46.0)
Hemoglobin: 12.2 g/dL (ref 12.0–15.0)
Hemoglobin: 12.2 g/dL (ref 12.0–15.0)
O2 Saturation: 67 %
O2 Saturation: 70 %
Potassium: 3.4 mmol/L — ABNORMAL LOW (ref 3.5–5.1)
Potassium: 3.5 mmol/L (ref 3.5–5.1)
Sodium: 136 mmol/L (ref 135–145)
Sodium: 137 mmol/L (ref 135–145)
TCO2: 39 mmol/L — ABNORMAL HIGH (ref 22–32)
TCO2: 40 mmol/L — ABNORMAL HIGH (ref 22–32)
pCO2, Ven: 62.8 mmHg — ABNORMAL HIGH (ref 44.0–60.0)
pCO2, Ven: 64.9 mmHg — ABNORMAL HIGH (ref 44.0–60.0)
pH, Ven: 7.379 (ref 7.250–7.430)
pH, Ven: 7.38 (ref 7.250–7.430)
pO2, Ven: 37 mmHg (ref 32.0–45.0)
pO2, Ven: 39 mmHg (ref 32.0–45.0)

## 2020-06-25 LAB — BASIC METABOLIC PANEL
Anion gap: 9 (ref 5–15)
BUN: 9 mg/dL (ref 6–20)
CO2: 32 mmol/L (ref 22–32)
Calcium: 9.1 mg/dL (ref 8.9–10.3)
Chloride: 94 mmol/L — ABNORMAL LOW (ref 98–111)
Creatinine, Ser: 0.84 mg/dL (ref 0.44–1.00)
GFR, Estimated: >60 mL/min (ref >60)
Glucose, Bld: 127 mg/dL — ABNORMAL HIGH (ref 70–99)
Potassium: 2.9 mmol/L — ABNORMAL LOW (ref 3.5–5.1)
Sodium: 135 mmol/L (ref 135–145)

## 2020-06-25 LAB — CBC
HCT: 40.4 % (ref 36.0–46.0)
Hemoglobin: 12.7 g/dL (ref 12.0–15.0)
MCH: 30.5 pg (ref 26.0–34.0)
MCHC: 31.4 g/dL (ref 30.0–36.0)
MCV: 97.1 fL (ref 80.0–100.0)
Platelets: 273 10*3/uL (ref 150–400)
RBC: 4.16 MIL/uL (ref 3.87–5.11)
RDW: 14.3 % (ref 11.5–15.5)
WBC: 6.7 10*3/uL (ref 4.0–10.5)
nRBC: 0 % (ref 0.0–0.2)

## 2020-06-25 LAB — GLUCOSE, CAPILLARY: Glucose-Capillary: 126 mg/dL — ABNORMAL HIGH (ref 70–99)

## 2020-06-25 SURGERY — RIGHT/LEFT HEART CATH AND CORONARY ANGIOGRAPHY
Anesthesia: LOCAL

## 2020-06-25 MED ORDER — ASPIRIN 81 MG PO CHEW
81.0000 mg | CHEWABLE_TABLET | ORAL | Status: AC
  Administered 2020-06-25: 81 mg via ORAL
  Filled 2020-06-25: qty 1

## 2020-06-25 MED ORDER — SODIUM CHLORIDE 0.9 % WEIGHT BASED INFUSION: 1.0000 mL/kg/h | INTRAVENOUS | Status: DC

## 2020-06-25 MED ORDER — NITROGLYCERIN 1 MG/10 ML FOR IR/CATH LAB
INTRA_ARTERIAL | Status: AC
  Filled 2020-06-25: qty 10

## 2020-06-25 MED ORDER — HEPARIN SODIUM (PORCINE) 1000 UNIT/ML IJ SOLN
INTRAMUSCULAR | Status: AC
  Filled 2020-06-25: qty 1

## 2020-06-25 MED ORDER — LIDOCAINE HCL (PF) 1 % IJ SOLN
INTRAMUSCULAR | Status: AC
  Filled 2020-06-25: qty 30

## 2020-06-25 MED ORDER — HEPARIN (PORCINE) IN NACL 1000-0.9 UT/500ML-% IV SOLN
INTRAVENOUS | Status: AC
  Filled 2020-06-25: qty 1500

## 2020-06-25 MED ORDER — LIDOCAINE HCL (PF) 1 % IJ SOLN
INTRAMUSCULAR | Status: DC | PRN
  Administered 2020-06-25 (×2): 2 mL

## 2020-06-25 MED ORDER — POTASSIUM CHLORIDE CRYS ER 10 MEQ PO TBCR
40.0000 meq | EXTENDED_RELEASE_TABLET | Freq: Once | ORAL | Status: AC
  Administered 2020-06-25: 40 meq via ORAL
  Filled 2020-06-25: qty 4

## 2020-06-25 MED ORDER — SODIUM CHLORIDE 0.9% FLUSH
3.0000 mL | Freq: Two times a day (BID) | INTRAVENOUS | Status: DC
Start: 2020-06-25 — End: 2020-06-25

## 2020-06-25 MED ORDER — SODIUM CHLORIDE 0.9 % IV SOLN: INTRAVENOUS | Status: DC

## 2020-06-25 MED ORDER — SODIUM CHLORIDE 0.9% FLUSH: 3.0000 mL | INTRAVENOUS | Status: DC | PRN

## 2020-06-25 MED ORDER — VERAPAMIL HCL 2.5 MG/ML IV SOLN
INTRAVENOUS | Status: AC
  Filled 2020-06-25: qty 2

## 2020-06-25 MED ORDER — SODIUM CHLORIDE 0.9 % IV SOLN: 250.0000 mL | INTRAVENOUS | Status: DC | PRN

## 2020-06-25 MED ORDER — VERAPAMIL HCL 2.5 MG/ML IV SOLN
INTRAVENOUS | Status: DC | PRN
  Administered 2020-06-25: 10 mL via INTRA_ARTERIAL

## 2020-06-25 MED ORDER — FENTANYL CITRATE (PF) 100 MCG/2ML IJ SOLN
INTRAMUSCULAR | Status: AC
  Filled 2020-06-25: qty 2

## 2020-06-25 MED ORDER — HYDRALAZINE HCL 20 MG/ML IJ SOLN: 10.0000 mg | INTRAMUSCULAR | Status: DC | PRN

## 2020-06-25 MED ORDER — MIDAZOLAM HCL 2 MG/2ML IJ SOLN
INTRAMUSCULAR | Status: AC
  Filled 2020-06-25: qty 2

## 2020-06-25 MED ORDER — HEPARIN SODIUM (PORCINE) 1000 UNIT/ML IJ SOLN
INTRAMUSCULAR | Status: DC | PRN
  Administered 2020-06-25: 4000 [IU] via INTRAVENOUS

## 2020-06-25 MED ORDER — IOHEXOL 350 MG/ML SOLN
INTRAVENOUS | Status: DC | PRN
  Administered 2020-06-25: 50 mL via INTRA_ARTERIAL

## 2020-06-25 MED ORDER — ONDANSETRON HCL 4 MG/2ML IJ SOLN: 4.0000 mg | Freq: Four times a day (QID) | INTRAMUSCULAR | Status: DC | PRN

## 2020-06-25 MED ORDER — SODIUM CHLORIDE 0.9% FLUSH: 3.0000 mL | Freq: Two times a day (BID) | INTRAVENOUS | Status: DC

## 2020-06-25 MED ORDER — LABETALOL HCL 5 MG/ML IV SOLN: 10.0000 mg | INTRAVENOUS | Status: DC | PRN

## 2020-06-25 MED ORDER — NITROGLYCERIN 1 MG/10 ML FOR IR/CATH LAB
INTRA_ARTERIAL | Status: DC | PRN
  Administered 2020-06-25: 200 ug via INTRA_ARTERIAL

## 2020-06-25 MED ORDER — HEPARIN (PORCINE) IN NACL 1000-0.9 UT/500ML-% IV SOLN
INTRAVENOUS | Status: DC | PRN
  Administered 2020-06-25 (×2): 500 mL

## 2020-06-25 MED ORDER — ACETAMINOPHEN 325 MG PO TABS: 650.0000 mg | ORAL_TABLET | ORAL | Status: DC | PRN

## 2020-06-25 MED ORDER — FENTANYL CITRATE (PF) 100 MCG/2ML IJ SOLN
INTRAMUSCULAR | Status: DC | PRN
  Administered 2020-06-25 (×3): 25 ug via INTRAVENOUS

## 2020-06-25 MED ORDER — MIDAZOLAM HCL 2 MG/2ML IJ SOLN
INTRAMUSCULAR | Status: DC | PRN
  Administered 2020-06-25 (×3): 1 mg via INTRAVENOUS

## 2020-06-25 SURGICAL SUPPLY — 13 items
CATH 5FR JL3.5 JR4 ANG PIG MP (CATHETERS) ×1 IMPLANT
CATH BALLN WEDGE 5F 110CM (CATHETERS) ×1 IMPLANT
CATH INFINITI 4FR 3 DRC (CATHETERS) ×1 IMPLANT
DEVICE RAD COMP TR BAND LRG (VASCULAR PRODUCTS) ×1 IMPLANT
GLIDESHEATH SLEND SS 6F .021 (SHEATH) ×1 IMPLANT
GUIDEWIRE INQWIRE 1.5J.035X260 (WIRE) IMPLANT
INQWIRE 1.5J .035X260CM (WIRE) ×2
KIT HEART LEFT (KITS) ×2 IMPLANT
PACK CARDIAC CATHETERIZATION (CUSTOM PROCEDURE TRAY) ×2 IMPLANT
SHEATH GLIDE SLENDER 4/5FR (SHEATH) ×1 IMPLANT
TRANSDUCER W/STOPCOCK (MISCELLANEOUS) ×2 IMPLANT
TUBING CIL FLEX 10 FLL-RA (TUBING) ×1 IMPLANT
WIRE HI TORQ VERSACORE-J 145CM (WIRE) ×1 IMPLANT

## 2020-06-25 NOTE — Discharge Instructions (Signed)
Radial Site Care  This sheet gives you information about how to care for yourself after your procedure. Your health care provider may also give you more specific instructions. If you have problems or questions, contact your health care provider. What can I expect after the procedure? After the procedure, it is common to have:  Bruising and tenderness at the catheter insertion area. Follow these instructions at home: Medicines  Take over-the-counter and prescription medicines only as told by your health care provider. Insertion site care  Follow instructions from your health care provider about how to take care of your insertion site. Make sure you: ? Wash your hands with soap and water before you change your bandage (dressing). If soap and water are not available, use hand sanitizer. ? Change your dressing as told by your health care provider. ? Leave stitches (sutures), skin glue, or adhesive strips in place. These skin closures may need to stay in place for 2 weeks or longer. If adhesive strip edges start to loosen and curl up, you may trim the loose edges. Do not remove adhesive strips completely unless your health care provider tells you to do that.  Check your insertion site every day for signs of infection. Check for: ? Redness, swelling, or pain. ? Fluid or blood. ? Pus or a bad smell. ? Warmth.  Do not take baths, swim, or use a hot tub until your health care provider approves.  You may shower 24-48 hours after the procedure, or as directed by your health care provider. ? Remove the dressing and gently wash the site with plain soap and water. ? Pat the area dry with a clean towel. ? Do not rub the site. That could cause bleeding.  Do not apply powder or lotion to the site. Activity  For 24 hours after the procedure, or as directed by your health care provider: ? Do not flex or bend the affected arm. ? Do not push or pull heavy objects with the affected arm. ? Do not drive  yourself home from the hospital or clinic. You may drive 24 hours after the procedure unless your health care provider tells you not to. ? Do not operate machinery or power tools.  Do not lift anything that is heavier than 10 lb (4.5 kg), or the limit that you are told, until your health care provider says that it is safe.  Ask your health care provider when it is okay to: ? Return to work or school. ? Resume usual physical activities or sports. ? Resume sexual activity.   General instructions  If the catheter site starts to bleed, raise your arm and put firm pressure on the site. If the bleeding does not stop, get help right away. This is a medical emergency.  If you went home on the same day as your procedure, a responsible adult should be with you for the first 24 hours after you arrive home.  Keep all follow-up visits as told by your health care provider. This is important. Contact a health care provider if:  You have a fever.  You have redness, swelling, or yellow drainage around your insertion site. Get help right away if:  You have unusual pain at the radial site.  The catheter insertion area swells very fast.  The insertion area is bleeding, and the bleeding does not stop when you hold steady pressure on the area.  Your arm or hand becomes pale, cool, tingly, or numb. These symptoms may represent a serious  problem that is an emergency. Do not wait to see if the symptoms will go away. Get medical help right away. Call your local emergency services (911 in the [place] Do not drive yourself to the hospital. Summary  After the procedure, it is common to have bruising and tenderness at the site.  Follow instructions from your health care provider about how to take care of your radial site wound. Check the wound every day for signs of infection.  Do not lift anything that is heavier than 10 lb (4.5 kg), or the limit that you are told, until your health care provider says that it  is safe. This information is not intended to replace advice given to you by your health care provider. Make sure you discuss any questions you have with your health care provider. Document Revised: 03/16/2017 Document Reviewed: 03/16/2017 Elsevier Patient Education  2021 [employer].

## 2020-06-25 NOTE — Progress Notes (Signed)
Urine specimen unable to obtain due to pt reports she can not void.  Blood drawn from right AC PIV sent in red tube for preg test.  Will continue to monitor

## 2020-06-25 NOTE — Interval H&P Note (Signed)
History and Physical Interval Note:  06/25/2020 9:25 AM  Tasha Ballard  has presented today for surgery, with the diagnosis of lung disease.  The various methods of treatment have been discussed with the patient and family. After consideration of risks, benefits and other options for treatment, the patient has consented to  Procedure(s): RIGHT/LEFT HEART CATH AND CORONARY ANGIOGRAPHY (N/A) as a surgical intervention.  The patient's history has been reviewed, patient examined, no change in status, stable for surgery.  I have reviewed the patient's chart and labs.  Questions were answered to the patient's satisfaction.     Josefa [place] [employer]

## 2020-06-25 NOTE — Progress Notes (Signed)
Called lab, Sherell reports she could not find the specimen.  Barbaraann Rondo in cath  lab notified

## 2020-06-26 ENCOUNTER — Encounter (HOSPITAL_COMMUNITY): Payer: BC Managed Care – PPO

## 2020-06-26 MED FILL — Heparin Sod (Porcine)-NaCl IV Soln 1000 Unit/500ML-0.9%: INTRAVENOUS | Qty: 500 | Status: AC

## 2020-06-26 NOTE — Telephone Encounter (Signed)
Sure, happy to get her started.   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  Thanks.

## 2020-06-26 NOTE — Telephone Encounter (Signed)
Dalton  Seeme she KYLEN ISMAEL  meets all of the criteria below for Tyvaso for WHO GRoup 3. Thanks for doing the [place].  Happy for you to start Tyvaso but if you guys are busy then we can  Thanks  MR  xxxxxxx   Include in patients with ILD   - Right heart cath :  PVR > 3, PCWP </= 15, Pmap >/=  25 -  Patient needed to be able to walk 178m/ 300 feet on a 629malk test  Exclude - LVEF < 40%,  - Baseline o2 > 10L -

## 2020-07-01 ENCOUNTER — Other Ambulatory Visit: Payer: Self-pay

## 2020-07-01 ENCOUNTER — Ambulatory Visit (HOSPITAL_COMMUNITY)
Admission: RE | Admit: 2020-07-01 | Discharge: 2020-07-01 | Disposition: A | Discharge status: Home / Self Care | Payer: BC Managed Care – PPO | Source: Ambulatory Visit | Attending: Cardiology | Admitting: Cardiology

## 2020-07-01 ENCOUNTER — Encounter (HOSPITAL_COMMUNITY): Payer: Self-pay

## 2020-07-01 ENCOUNTER — Encounter (HOSPITAL_COMMUNITY)
Admission: RE | Admit: 2020-07-01 | Discharge: 2020-07-01 | Disposition: A | Discharge status: Home / Self Care | Payer: BC Managed Care – PPO | Source: Ambulatory Visit | Attending: Internal Medicine | Admitting: Internal Medicine

## 2020-07-01 DIAGNOSIS — Z0130 Encounter for examination of blood pressure without abnormal findings: Secondary | ICD-10-CM | POA: Insufficient documentation

## 2020-07-01 DIAGNOSIS — J849 Interstitial pulmonary disease, unspecified: Secondary | ICD-10-CM | POA: Diagnosis not present

## 2020-07-01 NOTE — Progress Notes (Signed)
Daily Session Note  Patient Details  Name: Tasha Ballard MRN: [id number] Date of Birth: 09-14-1970 Referring Provider:   April Manson Pulmonary Rehab Walk Test from 06/02/2020 in [place]  Referring Provider Dr. [name]      Encounter Date: 07/01/2020  Check In:  Session Check In - 07/01/20 1153      Check-In   Supervising physician immediately available to respond to emergencies [facility] immediately available    Physician(s) Dr. Arbutus Ped    [facility]    Staff Present Rosebud Poles, RN, Isaac Laud, MS, ACSM-CEP, Exercise Physiologist;Annedrea Rosezella [place], RN, MHA;Carlette Wilber Oliphant, RN, Milus Glazier, MS, ACSM-CEP, CCRP, Exercise Physiologist;Olinty Celesta Aver, MS, ACSM CEP, Exercise Physiologist    Virtual Visit No    Medication changes reported     No    Fall or balance concerns reported    No    Tobacco Cessation No Change    Warm-up and Cool-down Performed on first and last piece of equipment    Resistance Training Performed Yes    VAD Patient? No    PAD/SET Patient? No      Pain Assessment   Currently in Pain? No/denies    Pain Score 0-No pain    Multiple Pain Sites No           Capillary Blood Glucose: No results found for this or any previous visit (from the past 24 hour(s)).    Social History   Tobacco Use  Smoking Status Never Smoker  Smokeless Tobacco Never Used    Goals Met:  Proper associated with RPD/PD & O2 Sat Exercise tolerated well Strength training completed today  Goals Unmet:  Not Applicable  Comments: Service time is from 92 to 1    Dr. Fransico Him is Medical Director for Cardiac Rehab at [facility].

## 2020-07-03 ENCOUNTER — Other Ambulatory Visit: Payer: Self-pay

## 2020-07-03 ENCOUNTER — Encounter (HOSPITAL_COMMUNITY)
Admission: RE | Admit: 2020-07-03 | Discharge: 2020-07-03 | Disposition: A | Discharge status: Home / Self Care | Payer: BC Managed Care – PPO | Source: Ambulatory Visit | Attending: Internal Medicine | Admitting: Internal Medicine

## 2020-07-03 VITALS — Wt 193.3 lb

## 2020-07-03 DIAGNOSIS — J849 Interstitial pulmonary disease, unspecified: Secondary | ICD-10-CM | POA: Diagnosis not present

## 2020-07-03 NOTE — Progress Notes (Signed)
Daily Session Note  Patient Details  Name: Tasha Ballard MRN: [id number] Date of Birth: 03/14/1970 Referring Provider:   Flowsheet Row Pulmonary Rehab Walk Test from 06/02/2020 in [facility]  Referring Provider Dr. Ramaswamy      Encounter Date: 07/03/2020  Check In:  Session Check In - 07/03/20 1158      Check-In   Supervising physician immediately available to respond to emergencies [facility] immediately available    Physician(s) Dr. Nettey    [facility]    Staff Present  , RN, BSN;Jessica Martin, MS, ACSM-CEP, Exercise Physiologist;Carlette Carlton, RN, BSN;David Makemson, MS, ACSM-CEP, CCRP, Exercise Physiologist;Annedrea Stackhouse, RN, MHA    Virtual Visit No    Medication changes reported     No    Fall or balance concerns reported    No    Tobacco Cessation No Change    Warm-up and Cool-down Performed as group-led instruction    Resistance Training Performed Yes    VAD Patient? No    PAD/SET Patient? No      Pain Assessment   Currently in Pain? No/denies    Multiple Pain Sites No           Capillary Blood Glucose: No results found for this or any previous visit (from the past 24 hour(s)).    Social History   Tobacco Use  Smoking Status Never Smoker  Smokeless Tobacco Never Used    Goals Met:  Proper associated with RPD/PD & O2 Sat Exercise tolerated well Strength training completed today  Goals Unmet:  Not Applicable  Comments: Service time is from 1015 to 1130   Dr. Traci Turner is Medical Director for Cardiac Rehab at [facility]. 

## 2020-07-07 ENCOUNTER — Telehealth (HOSPITAL_COMMUNITY): Payer: Self-pay | Admitting: *Deleted

## 2020-07-07 NOTE — Telephone Encounter (Signed)
Per Adline Potter tyvaso needs to be started by pulmonology for ILD. Pt is not followed by our clinic and does need to see Dr.McLean. Heather to follow up with Dr.McLean.

## 2020-07-08 ENCOUNTER — Encounter (HOSPITAL_COMMUNITY)
Admission: RE | Admit: 2020-07-08 | Discharge: 2020-07-08 | Disposition: A | Discharge status: Home / Self Care | Payer: BC Managed Care – PPO | Source: Ambulatory Visit | Attending: Internal Medicine | Admitting: Internal Medicine

## 2020-07-10 ENCOUNTER — Encounter (HOSPITAL_COMMUNITY)
Admission: RE | Admit: 2020-07-10 | Discharge: 2020-07-10 | Disposition: A | Discharge status: Home / Self Care | Payer: BC Managed Care – PPO | Source: Ambulatory Visit | Attending: Internal Medicine | Admitting: Internal Medicine

## 2020-07-15 ENCOUNTER — Encounter (HOSPITAL_COMMUNITY)
Admission: RE | Admit: 2020-07-15 | Discharge: 2020-07-15 | Disposition: A | Discharge status: Home / Self Care | Payer: BC Managed Care – PPO | Source: Ambulatory Visit | Attending: Internal Medicine | Admitting: Internal Medicine

## 2020-07-15 ENCOUNTER — Other Ambulatory Visit: Payer: Self-pay

## 2020-07-15 DIAGNOSIS — J849 Interstitial pulmonary disease, unspecified: Secondary | ICD-10-CM

## 2020-07-15 NOTE — Progress Notes (Signed)
Daily Session Note  Patient Details  Name: Tasha Ballard MRN: [id number] Date of Birth: Jun 20, 1970 Referring Provider:   April Manson Pulmonary Rehab Walk Test from 06/02/2020 in [place]  Referring Provider Dr. [name]      Encounter Date: 07/15/2020  Check In:  Session Check In - 07/15/20 1201      Check-In   Supervising physician immediately available to respond to emergencies [facility] immediately available    Physician(s) Dr. Maren Beach    [facility]    Staff Present Leda Roys, MS, ACSM-CEP, Exercise Physiologist;Annedrea Rosezella [place], RN, MHA;Lisa Ysidro Evert, RN;Olinty Celesta Aver, MS, ACSM CEP, Exercise Physiologist    Virtual Visit No    Medication changes reported     No    Fall or balance concerns reported    No    Tobacco Cessation No Change    Warm-up and Cool-down Performed as group-led instruction    Resistance Training Performed Yes    VAD Patient? No    PAD/SET Patient? No      Pain Assessment   Currently in Pain? No/denies    Multiple Pain Sites No           Capillary Blood Glucose: No results found for this or any previous visit (from the past 24 hour(s)).    Social History   Tobacco Use  Smoking Status Never Smoker  Smokeless Tobacco Never Used    Goals Met:  Proper associated with RPD/PD & O2 Sat [place] with exercise equipment Exercise tolerated well No report of cardiac concerns or symptoms Strength training completed today  Goals Unmet:  Not Applicable  Comments: Service time is from 1015 to 1130    Dr. Fransico Him is Medical Director for Cardiac Rehab at [facility].

## 2020-07-15 NOTE — Progress Notes (Signed)
Pulmonary Individual Treatment Plan  Patient Details  Name: Tasha Ballard MRN: [id number] Date of Birth: 1970-03-09 Referring Provider:   April Manson Pulmonary Rehab Walk Test from 06/02/2020 in [place]  Referring Provider Dr. [name]      Initial Encounter Date:  Flowsheet Row Pulmonary Rehab Walk Test from 06/02/2020 in [facility]  Date 06/02/20      Visit Diagnosis: ILD (interstitial lung disease) ([place])  Patient's Home Medications on Admission:   Current Outpatient Medications:  .  azaTHIOprine (IMURAN) 50 MG tablet, TAKE 2 TABLETS (100MG) BY MOUTH EVERY DAY, Disp: 180 tablet, Rfl: 1 .  benzonatate (TESSALON) 200 MG capsule, Take 1 capsule (200 mg total) by mouth 3 (three) times daily as needed for cough., Disp: 30 capsule, Rfl: 1 .  carvedilol (COREG) 6.25 MG tablet, Take 6.25 mg by mouth 2 (two) times daily with a meal., Disp: , Rfl:  .  famotidine (PEPCID) 40 MG tablet, TAKE 1 TABLET BY MOUTH EVERY DAY, Disp: 90 tablet, Rfl: 3 .  folic acid (FOLVITE) 1 MG tablet, Take 1 tablet (1 mg total) by mouth daily., Disp: 30 tablet, Rfl: 5 .  furosemide (LASIX) 40 MG tablet, Take 40 mg by mouth daily. may repeat dose if needed for fluid retention., Disp: , Rfl:  .  metFORMIN (GLUCOPHAGE) 500 MG tablet, Take 500 mg by mouth 2 (two) times daily., Disp: , Rfl:  .  montelukast (SINGULAIR) 10 MG tablet, TAKE 1 TABLET BY MOUTH EVERY DAY AFTER SUPPER, Disp: 90 tablet, Rfl: 1 .  OFEV 150 MG CAPS, TAKE 1 CAPSULE TWICE A DAY (12 HOURS APART) WITH FOOD, Disp: 60 capsule, Rfl: 11 .  ondansetron (ZOFRAN) 8 MG tablet, TAKE 1 TABLET BY MOUTH EVERY 8 HOURS AS NEEDED FOR NAUSEA AND VOMITING (Patient taking differently: Take 8 mg by mouth daily.), Disp: 45 tablet, Rfl: 2 .  OVER THE COUNTER MEDICATION, Place 1 application into both nostrils in the morning, at noon, and at bedtime. Saline Solution, Disp: , Rfl:  .  Pirfenidone 801 MG  TABS, TAKE 1 TABLET BY MOUTH 3 TIMES DAILY WITH MEALS, Disp: 90 tablet, Rfl: 11 .  potassium chloride (KLOR-CON) 10 MEQ tablet, TAKE 3 TABLETS BY MOUTH DAILY, Disp: 90 tablet, Rfl: 2 .  predniSONE (DELTASONE) 10 MG tablet, Take 1 tablet (10 mg total) by mouth daily., Disp: , Rfl:  .  rOPINIRole (REQUIP) 0.25 MG tablet, Take 1 tablet (0.25 mg total) by mouth at bedtime., Disp: 30 tablet, Rfl: 5 .  spironolactone (ALDACTONE) 100 MG tablet, Take 100 mg by mouth daily., Disp: , Rfl:  .  sulfamethoxazole-trimethoprim (BACTRIM) 400-80 MG tablet, Take 1 tablet by mouth every Monday, Wednesday, and Friday., Disp: , Rfl:  .  thyroid (ARMOUR) 90 MG tablet, Take 90 mg by mouth daily before breakfast. , Disp: , Rfl:  .  Vitamin D, Ergocalciferol, (DRISDOL) 1.25 MG (50000 UT) CAPS capsule, TAKE 1 CAPSULE BY MOUTH ONCE WEEKLY, Disp: 3 capsule, Rfl: 0  Past Medical History: Past Medical History:  Diagnosis Date  . Aneurysm ([place])   . Back pain   . Cancer ([place])   . Diabetes mellitus without complication ([place])   . H/O blood clots   . Hypertension   . Joint pain   . Pulmonary fibrosis ([place])   . SOB (shortness of breath)   . Swelling   . Thyroid disease   . Vitamin D deficiency     Tobacco Use:  Social History   Tobacco Use  Smoking Status Never Smoker  Smokeless Tobacco Never Used    Labs: Recent Review Flowsheet Data    Labs for ITP Cardiac and Pulmonary Rehab Latest Ref Rng & Units 02/13/2018 06/25/2020 06/25/2020   Cholestrol 100 - 199 mg/dL 295(H) - -   LDLCALC 0 - 99 mg/dL 186(H) - -   HDL >39 mg/dL 85 - -   Trlycerides 0 - 149 mg/dL 120 - -   Hemoglobin A1c 4.8 - 5.6 % 5.6 - -   HCO3 20.0 - 28.0 mmol/L - 38.3(H) 37.1(H)   TCO2 22 - 32 mmol/L - 40(H) 39(H)   O2SAT % - 70.0 67.0      Capillary Blood Glucose: Lab Results  Component Value Date   GLUCAP 126 (H) 06/25/2020   GLUCAP 130 (H) 02/19/2020   GLUCAP 126 (H) 12/28/2019   GLUCAP 99 12/28/2019   GLUCAP 88 12/28/2019      Pulmonary Assessment Scores:  Pulmonary Assessment Scores    Row Name 06/02/20 1132 06/02/20 1221       ADL UCSD   ADL Phase Entry --    SOB Score total 77 --         CAT Score   CAT Score 24 --         mMRC Score   mMRC Score -- 3          UCSD: Self-administered rating of dyspnea associated with activities of daily living (ADLs) 6-point scale (0 = "not at all" to 5 = "maximal or unable to do because of breathlessness")  Scoring Scores range from 0 to 120.  Minimally important difference is 5 units  CAT: CAT can identify the health impairment of COPD patients and is better correlated with disease progression.  CAT has a scoring range of zero to 40. The CAT score is classified into four groups of low (less than 10), medium (10 - 20), high (21-30) and very high (31-40) based on the impact level of disease on health status. A CAT score over 10 suggests significant symptoms.  A worsening CAT score could be explained by an exacerbation, poor medication adherence, poor inhaler technique, or progression of COPD or comorbid conditions.  CAT MCID is 2 points  mMRC: mMRC (Modified Medical Research Council) Dyspnea Scale is used to assess the degree of baseline functional disability in patients of respiratory disease due to dyspnea. No minimal important difference is established. A decrease in score of 1 point or greater is considered a positive change.   Pulmonary Function Assessment:  Pulmonary Function Assessment - 06/02/20 1131      Breath   Bilateral Breath Sounds Clear    Shortness of Breath Yes;Fear of Shortness of Breath;Limiting activity           Exercise Target Goals: Exercise Program Goal: Individual exercise prescription set using results from initial 6 min walk test and THRR while considering  patient's activity barriers and safety.   Exercise Prescription Goal: Initial exercise prescription builds to 30-45 minutes a day of aerobic activity, 2-3 days per  week.  Home exercise guidelines will be given to patient during program as part of exercise prescription that the participant will acknowledge.  Activity Barriers & Risk Stratification:  Activity Barriers & Cardiac Risk Stratification - 06/02/20 1115      Activity Barriers & Cardiac Risk Stratification   Activity Barriers Deconditioning;Muscular Weakness;Shortness of Breath           6 Minute Walk:  6 Minute Walk    Row Name 06/02/20 1221         6 Minute Walk   Phase Initial     Distance 1025 feet     Walk Time 6 minutes     # of Rest Breaks 0     MPH 1.94     METS 3.65     RPE 11     Perceived Dyspnea  1.5     VO2 Peak 12.79     Symptoms No     Resting HR 97 bpm     Resting BP 120/88     Resting Oxygen Saturation  99 %     Exercise Oxygen Saturation  during 6 min walk 87 %     Max Ex. HR 134 bpm     Max Ex. BP 170/98     2 Minute Post BP 154/90           Interval HR   1 Minute HR 118     2 Minute HR 124     3 Minute HR 134     4 Minute HR 127     5 Minute HR 130     6 Minute HR 134     2 Minute Post HR 111     Interval Heart Rate? Yes           Interval Oxygen   Interval Oxygen? Yes     Baseline Oxygen Saturation % 99 %     1 Minute Oxygen Saturation % 96 %     1 Minute Liters of Oxygen 10 L     2 Minute Oxygen Saturation % 91 %     2 Minute Liters of Oxygen 10 L     3 Minute Oxygen Saturation % 87 %     3 Minute Liters of Oxygen 10 L  Increased to 15L     4 Minute Oxygen Saturation % 93 %     4 Minute Liters of Oxygen 15 L     5 Minute Oxygen Saturation % 93 %     5 Minute Liters of Oxygen 15 L     6 Minute Oxygen Saturation % 93 %     6 Minute Liters of Oxygen 15 L     2 Minute Post Oxygen Saturation % 99 %     2 Minute Post Liters of Oxygen 10 L            Oxygen Initial Assessment:  Oxygen Initial Assessment - 06/02/20 1128      Home Oxygen   Home Oxygen Device Home Concentrator;E-Tanks    Sleep Oxygen Prescription Continuous     Liters per minute 8    Home Exercise Oxygen Prescription Continuous    Liters per minute 12    Home Resting Oxygen Prescription Continuous    Liters per minute 6    Compliance with Home Oxygen Use Yes      Initial 6 min Walk   Oxygen Used Continuous;E-Tanks    Liters per minute 10   Had to increase to 15L     Program Oxygen Prescription   Program Oxygen Prescription Continuous;E-Tanks    Liters per minute 10    Comments 10L for seated, 15L for walking      Intervention   Short Term Goals To learn and exhibit compliance with exercise, home and travel O2 prescription;To learn and understand importance of monitoring SPO2 with pulse oximeter and demonstrate accurate use of  the pulse oximeter.;To learn and understand importance of maintaining oxygen saturations>88%;To learn and demonstrate proper pursed lip breathing techniques or other breathing techniques.;To learn and demonstrate proper use of respiratory medications    Long  Term Goals Exhibits compliance with exercise, home and travel O2 prescription;Verbalizes importance of monitoring SPO2 with pulse oximeter and return demonstration;Maintenance of O2 saturations>88%;Exhibits proper breathing techniques, such as pursed lip breathing or other method taught during program session;Compliance with respiratory medication;Demonstrates proper use of MDI's           Oxygen Re-Evaluation:  Oxygen Re-Evaluation    Row Name 06/13/20 1456 07/10/20 0906           Program Oxygen Prescription   Program Oxygen Prescription Continuous;E-Tanks Continuous;E-Tanks      Liters per minute 10 -2      Comments We originally thought pt would need 15L of oxygen on the treadmill due to desaturation on the walk test but so far she has required 10L for seated exercise and for walking on the treadmill. Pt needs 8L for seated exercise and 10L with walking             Home Oxygen   Home Oxygen Device Home Concentrator;E-Tanks Home Concentrator;E-Tanks       Sleep Oxygen Prescription Continuous Continuous      Liters per minute -- 8      Home Exercise Oxygen Prescription Continuous Continuous      Liters per minute 12 10      Home Resting Oxygen Prescription Continuous Continuous      Liters per minute 6 6      Compliance with Home Oxygen Use Yes Yes             Goals/Expected Outcomes   Short Term Goals To learn and exhibit compliance with exercise, home and travel O2 prescription;To learn and understand importance of monitoring SPO2 with pulse oximeter and demonstrate accurate use of the pulse oximeter.;To learn and understand importance of maintaining oxygen saturations>88%;To learn and demonstrate proper pursed lip breathing techniques or other breathing techniques.;To learn and demonstrate proper use of respiratory medications To learn and exhibit compliance with exercise, home and travel O2 prescription;To learn and understand importance of monitoring SPO2 with pulse oximeter and demonstrate accurate use of the pulse oximeter.;To learn and understand importance of maintaining oxygen saturations>88%;To learn and demonstrate proper pursed lip breathing techniques or other breathing techniques.;To learn and demonstrate proper use of respiratory medications      Long  Term Goals Exhibits compliance with exercise, home and travel O2 prescription;Verbalizes importance of monitoring SPO2 with pulse oximeter and return demonstration;Maintenance of O2 saturations>88%;Exhibits proper breathing techniques, such as pursed lip breathing or other method taught during program session;Compliance with respiratory medication;Demonstrates proper use of MDI's Exhibits compliance with exercise, home and travel O2 prescription;Verbalizes importance of monitoring SPO2 with pulse oximeter and return demonstration;Maintenance of O2 saturations>88%;Exhibits proper breathing techniques, such as pursed lip breathing or other method taught during program session;Compliance with  respiratory medication;Demonstrates proper use of MDI's      Goals/Expected Outcomes Compliance and understanding of oxygen saturation and pursed lip breathing Compliance and understanding of oxygen saturation and pursed lip breathing             Oxygen Discharge (Final Oxygen Re-Evaluation):  Oxygen Re-Evaluation - 07/10/20 0906      Program Oxygen Prescription   Program Oxygen Prescription Continuous;E-Tanks    Liters per minute -2    Comments Pt needs 8L for seated exercise and  10L with walking      Home Oxygen   Home Oxygen Device Home Concentrator;E-Tanks    Sleep Oxygen Prescription Continuous    Liters per minute 8    Home Exercise Oxygen Prescription Continuous    Liters per minute 10    Home Resting Oxygen Prescription Continuous    Liters per minute 6    Compliance with Home Oxygen Use Yes      Goals/Expected Outcomes   Short Term Goals To learn and exhibit compliance with exercise, home and travel O2 prescription;To learn and understand importance of monitoring SPO2 with pulse oximeter and demonstrate accurate use of the pulse oximeter.;To learn and understand importance of maintaining oxygen saturations>88%;To learn and demonstrate proper pursed lip breathing techniques or other breathing techniques.;To learn and demonstrate proper use of respiratory medications    Long  Term Goals Exhibits compliance with exercise, home and travel O2 prescription;Verbalizes importance of monitoring SPO2 with pulse oximeter and return demonstration;Maintenance of O2 saturations>88%;Exhibits proper breathing techniques, such as pursed lip breathing or other method taught during program session;Compliance with respiratory medication;Demonstrates proper use of MDI's    Goals/Expected Outcomes Compliance and understanding of oxygen saturation and pursed lip breathing           Initial Exercise Prescription:  Initial Exercise Prescription - 06/02/20 1300      Date of Initial Exercise RX  and Referring Provider   Date 06/02/20    Referring Provider Dr. [name]    Expected Discharge Date 08/07/20      Oxygen   Oxygen Continuous    Liters 10-15      Treadmill   MPH 1.6    Grade 0    Minutes 15      NuStep   Level 2    SPM 80    Minutes 15      Prescription Details   Frequency (times per week) 2    Duration Progress to 30 minutes of continuous aerobic without signs/symptoms of physical distress      Intensity   THRR 40-80% of Max Heartrate 68-135    Ratings of Perceived Exertion 11-13    Perceived Dyspnea 0-4      Progression   Progression Continue to progress workloads to maintain intensity without signs/symptoms of physical distress.      Resistance Training   Training Prescription Yes    Weight Red bands    Reps 10-15           Perform Capillary Blood Glucose checks as needed.  Exercise Prescription Changes:  Exercise Prescription Changes    Row Name 06/24/20 1200 07/08/20 1100           Response to Exercise   Blood Pressure (Admit) 124/80 142/90      Blood Pressure (Exercise) 110/74 --      Blood Pressure (Exit) 112/80 138/84      Heart Rate (Admit) 94 bpm 103 bpm      Heart Rate (Exercise) 115 bpm 119 bpm      Heart Rate (Exit) 102 bpm 104 bpm      Oxygen Saturation (Admit) 99 % 98 %      Oxygen Saturation (Exercise) 97 % 89 %      Oxygen Saturation (Exit) 99 % 97 %      Rating of Perceived Exertion (Exercise) 12 12      Perceived Dyspnea (Exercise) 2 2      Duration Continue with 30 min of aerobic exercise without signs/symptoms of physical  distress. Continue with 30 min of aerobic exercise without signs/symptoms of physical distress.      Intensity THRR unchanged THRR unchanged             Progression   Progression Continue to progress workloads to maintain intensity without signs/symptoms of physical distress. Continue to progress workloads to maintain intensity without signs/symptoms of physical distress.              Resistance Training   Training Prescription Yes Yes      Weight Red bands red bands      Reps 10-15 10-15      Time 10 Minutes 10 Minutes             Oxygen   Oxygen Continuous Continuous      Liters 10-15 10-15             Treadmill   MPH 1.6 --      Grade 0 --      Minutes 15 --             NuStep   Level 4 4      SPM 80 80      Minutes 15 36      METs 1.5 1.9             Exercise Comments:   Exercise Goals and Review:  Exercise Goals    Row Name 06/02/20 1218             Exercise Goals   Increase Physical Activity Yes       Intervention Provide advice, education, support and counseling about physical activity/exercise needs.;Develop an individualized exercise prescription for aerobic and resistive training based on initial evaluation findings, risk stratification, comorbidities and participant's personal goals.       Expected Outcomes Short Term: Attend rehab on a regular basis to increase amount of physical activity.;Long Term: Add in home exercise to make exercise part of routine and to increase amount of physical activity.;Long Term: Exercising regularly at least 3-5 days a week.       Increase Strength and Stamina Yes       Intervention Provide advice, education, support and counseling about physical activity/exercise needs.;Develop an individualized exercise prescription for aerobic and resistive training based on initial evaluation findings, risk stratification, comorbidities and participant's personal goals.       Expected Outcomes Short Term: Increase workloads from initial exercise prescription for resistance, speed, and METs.;Short Term: Perform resistance training exercises routinely during rehab and add in resistance training at home;Long Term: Improve cardiorespiratory fitness, muscular endurance and strength as measured by increased METs and functional capacity (6MWT)       Able to understand and use rate of perceived exertion (RPE) scale Yes        Intervention Provide education and explanation on how to use RPE scale       Expected Outcomes Short Term: Able to use RPE daily in rehab to express subjective intensity level;Long Term:  Able to use RPE to guide intensity level when exercising independently       Able to understand and use Dyspnea scale Yes       Intervention Provide education and explanation on how to use Dyspnea scale       Expected Outcomes Short Term: Able to use Dyspnea scale daily in rehab to express subjective sense of shortness of breath during exertion;Long Term: Able to use Dyspnea scale to guide intensity level when exercising independently  Knowledge and understanding of Target Heart Rate Range (THRR) Yes       Intervention Provide education and explanation of THRR including how the numbers were predicted and where they are located for reference       Expected Outcomes Short Term: Able to state/look up THRR;Long Term: Able to use THRR to govern intensity when exercising independently;Short Term: Able to use daily as guideline for intensity in rehab       Understanding of Exercise Prescription Yes       Intervention Provide education, explanation, and written materials on patient's individual exercise prescription       Expected Outcomes Short Term: Able to explain program exercise prescription;Long Term: Able to explain home exercise prescription to exercise independently              Exercise Goals Re-Evaluation :  Exercise Goals Re-Evaluation    Row Name 06/13/20 1451 07/10/20 0903           Exercise Goal Re-Evaluation   Exercise Goals Review Increase Physical Activity;Increase Strength and Stamina;Able to understand and use rate of perceived exertion (RPE) scale;Able to understand and use Dyspnea scale;Knowledge and understanding of Target Heart Rate Range (THRR);Understanding of Exercise Prescription Increase Physical Activity;Increase Strength and Stamina;Able to understand and use rate of perceived  exertion (RPE) scale;Able to understand and use Dyspnea scale;Knowledge and understanding of Target Heart Rate Range (THRR);Understanding of Exercise Prescription      Comments Leroy Kennedy has completed 1 exercise session so far, which is too early to note any progression. She tolerated her first day of exercie well. We had originally thought she would need 15L of oxygen while walking on the treadmill due to desaturation on walk test, but she only required 10L on the treadmill on her first day of exercise. We will continue to monitor oxygen requirements with exertion. She is exercising at 1.5 METS on the Nustep and 2.2 METS on the treadmill. Will continue to monitor and progress as she is able. Leroy Kennedy has completed 6 exercise sessions and has already made progression with workload and MET level increases. She is exercising at 1.9 METS on the Nustep and 2.2 METS on the treadmill. Will continue to monitor and progress as she is able.      Expected Outcomes Through exercise at rehab and at home, the patient will decrease shortness of breath with daily activities and feel confident in carrying out an exercise regime at home.  Through exercise at rehab and at home, the patient will decrease shortness of breath with daily activities and feel confident in carrying out an exercise regime at home.              Discharge Exercise Prescription (Final Exercise Prescription Changes):  Exercise Prescription Changes - 07/08/20 1100      Response to Exercise   Blood Pressure (Admit) 142/90    Blood Pressure (Exit) 138/84    Heart Rate (Admit) 103 bpm    Heart Rate (Exercise) 119 bpm    Heart Rate (Exit) 104 bpm    Oxygen Saturation (Admit) 98 %    Oxygen Saturation (Exercise) 89 %    Oxygen Saturation (Exit) 97 %    Rating of Perceived Exertion (Exercise) 12    Perceived Dyspnea (Exercise) 2    Duration Continue with 30 min of aerobic exercise without signs/symptoms of physical distress.    Intensity THRR  unchanged      Progression   Progression Continue to progress workloads to maintain intensity without  signs/symptoms of physical distress.      Resistance Training   Training Prescription Yes    Weight red bands    Reps 10-15    Time 10 Minutes      Oxygen   Oxygen Continuous    Liters 10-15      NuStep   Level 4    SPM 80    Minutes 36    METs 1.9           Nutrition:  Target Goals: Understanding of nutrition guidelines, daily intake of sodium <1584m, cholesterol <2070m calories 30% from fat and 7% or less from saturated fats, daily to have 5 or more servings of fruits and vegetables.  Biometrics:  Pre Biometrics - 06/02/20 1115      Pre Biometrics   Height _0  (1.575 m)    Weight 89.4 kg    BMI (Calculated) 36.04    Grip Strength 19 kg            Nutrition Therapy Plan and Nutrition Goals:  Nutrition Therapy & Goals - 06/17/20 1215      Nutrition Therapy   Diet TLC      Personal Nutrition Goals   Nutrition Goal Pt to identify food quantities necessary to achieve weight loss of 6-24 lb at graduation from cardiac rehab.      Intervention Plan   Intervention Prescribe, educate and counsel regarding individualized specific dietary modifications aiming towards targeted core components such as weight, hypertension, lipid management, diabetes, heart failure and other comorbidities.;Nutrition handout(s) given to patient.    Expected Outcomes Short Term Goal: A plan has been developed with personal nutrition goals set during dietitian appointment.;Long Term Goal: Adherence to prescribed nutrition plan.           Nutrition Assessments:  MEDIFICTS Score Key:  ?70 Need to make dietary changes   40-70 Heart Healthy Diet  ? 40 Therapeutic Level Cholesterol Diet  Flowsheet Row PULMONARY REHAB OTHER RESP[facility]AT[facility]     Picture Your Plate  Scores:  <4<82nhealthy dietary pattern with much room for improvement.  41-50 Dietary pattern unlikely to meet recommendations for good health and room for improvement.  51-60 More healthful dietary pattern, with some room for improvement.   >60 Healthy dietary pattern, although there may be some specific behaviors that could be improved.    Nutrition Goals Re-Evaluation:  Nutrition Goals Re-Evaluation    [place] 06/17/20 1216 07/14/20 1352           Goals   Current Weight 197 lb (89.4 kg) 193 lb 5.5 oz (87.7 kg)      Nutrition Goal Pt to identify food quantities necessary to achieve weight loss of 6-24 lb at graduation from cardiac rehab. Pt to identify food quantities necessary to achieve weight loss of 6-24 lb at graduation from cardiac rehab.      Comment -- 4 lbs weight loss             Personal Goal #2 Re-Evaluation   Personal Goal #2 -- Pt to build a healthy plate including vegetables, fruits, whole grains, and low-fat dairy products in a heart healthy meal plan.             Nutrition Goals Discharge (Final Nutrition Goals Re-Evaluation):  Nutrition Goals Re-Evaluation - 07/14/20 1352      Goals   Current Weight 193 lb 5.5 oz (87.7  kg)    Nutrition Goal Pt to identify food quantities necessary to achieve weight loss of 6-24 lb at graduation from cardiac rehab.    Comment 4 lbs weight loss      Personal Goal #2 Re-Evaluation   Personal Goal #2 Pt to build a healthy plate including vegetables, fruits, whole grains, and low-fat dairy products in a heart healthy meal plan.           Psychosocial: Target Goals: Acknowledge presence or absence of significant depression and/or stress, maximize coping skills, provide positive support system. Participant is able to verbalize types and ability to use techniques and skills needed for reducing stress and depression.  Initial Review & Psychosocial Screening:  Initial Psych Review & Screening - 06/02/20 1135      Initial  Review   Current issues with Current Stress Concerns   Is going through the lung transplant evaluation at [place]? Yes      Barriers   Psychosocial barriers to participate in program The patient should benefit from training in stress management and relaxation.      Screening Interventions   Interventions Encouraged to exercise    Expected Outcomes Long Term Goal: Stressors or current issues are controlled or eliminated.           Quality of Life Scores:  Scores of 19 and below usually indicate a poorer quality of life in these areas.  A difference of  2-3 points is a clinically meaningful difference.  A difference of 2-3 points in the total score of the Quality of Life Index has been associated with significant improvement in overall quality of life, self-image, physical symptoms, and general health in studies assessing change in quality of life.  PHQ-9: Recent Review Flowsheet Data    Depression screen [facility] 2/9 06/02/2020   Decreased Interest 0   Down, Depressed, Hopeless 0   PHQ - 2 Score 0   Altered sleeping 0   Tired, decreased energy 2   Change in appetite 1   Feeling bad or failure about yourself  0   Trouble concentrating 0   Moving slowly or fidgety/restless 0   Suicidal thoughts 0   PHQ-9 Score 3   Difficult doing work/chores Not difficult at all     Interpretation of Total Score  Total Score Depression Severity:  1-4 = Minimal depression, 5-9 = Mild depression, 10-14 = Moderate depression, 15-19 = Moderately severe depression, 20-27 = Severe depression   Psychosocial Evaluation and Intervention:  Psychosocial Evaluation - 06/02/20 1144      Psychosocial Evaluation & Interventions   Interventions Stress management education;Relaxation education;Encouraged to exercise with the program and follow exercise prescription    Continue Psychosocial Services  No Follow up required            Psychosocial Re-Evaluation:  Psychosocial Re-Evaluation    [place] Name 06/10/20 0919 07/07/20 1223           Psychosocial Re-Evaluation   Current issues with -- Current Stress Concerns      Comments No change since orientation, she begins pulmonary rehab 06/12/2020. Is dealing with being evaluated for a lung transplant at [facility] and this is stressful.  She is handling it well and has a very supportive family.      Expected Outcomes [name] to handle her stress in healthy ways.  She is being evaluated for  a lung transplant. --      Interventions Encouraged to attend Pulmonary Rehabilitation for the exercise;Relaxation education;Stress management education Stress management education;Relaxation education;Encouraged to attend Pulmonary Rehabilitation for the exercise      Continue Psychosocial Services  No Follow up required Follow up required by staff             Initial Review   Source of Stress Concerns -- Chronic Illness;Unable to participate in former interests or hobbies;Unable to perform yard/household activities             Psychosocial Discharge (Final Psychosocial Re-Evaluation):  Psychosocial Re-Evaluation - 07/07/20 1223      Psychosocial Re-Evaluation   Current issues with Current Stress Concerns    Comments Is dealing with being evaluated for a lung transplant at [facility] and this is stressful.  She is handling it well and has a very supportive family.    Interventions Stress management education;Relaxation education;Encouraged to attend Pulmonary Rehabilitation for the exercise    Continue Psychosocial Services  Follow up required by staff      Initial Review   Source of Stress Concerns Chronic Illness;Unable to participate in former interests or hobbies;Unable to perform yard/household activities           Education: Education Goals: Education classes will be provided on a weekly basis, covering required topics. Participant will state  understanding/return demonstration of topics presented.  Learning Barriers/Preferences:  Learning Barriers/Preferences - 06/02/20 1145      Learning Barriers/Preferences   Learning Barriers None    Learning Preferences Computer/Internet           Education Topics: Risk Factor Reduction:  -Group instruction that is supported by a PowerPoint presentation. Instructor discusses the definition of a risk factor, different risk factors for pulmonary disease, and how the heart and lungs work together.   Flowsheet Row PULMONARY REHAB OTHER RESPIRATORY from 07/03/2020 in [place]  Date 05/31/19  Educator DF  Instruction Review Code 2- Demonstrated Understanding      Nutrition for Pulmonary Patient:  -Group instruction provided by PowerPoint slides, verbal discussion, and written materials to support subject matter. The instructor gives an explanation and review of healthy diet recommendations, which includes a discussion on weight management, recommendations for fruit and vegetable consumption, as well as protein, fluid, caffeine, fiber, sodium, sugar, and alcohol. Tips for eating when patients are short of breath are discussed. Flowsheet Row PULMONARY REHAB OTHER RESPIRATORY from 07/03/2020 in [place]  Date 05/29/19  Educator MD  Instruction Review Code 2- Demonstrated Understanding      Pursed Lip Breathing:  -Group instruction that is supported by demonstration and informational handouts. Instructor discusses the benefits of pursed lip and diaphragmatic breathing and detailed demonstration on how to preform both.     Oxygen Safety:  -Group instruction provided by PowerPoint, verbal discussion, and written material to support subject matter. There is an overview of "What is Oxygen" and "Why do we need it".  Instructor also reviews how to create a safe environment for oxygen use, the importance of using oxygen as  prescribed, and the risks of noncompliance. There is a brief discussion on traveling with oxygen and resources the patient may utilize. Flowsheet Row PULMONARY REHAB OTHER RESPIRATORY from 12/15/2017 in [place]  Date 11/24/17  Educator Cloyde Reams  Instruction Review Code 1- Verbalizes Understanding      Oxygen Equipment:  -Group instruction provided by [employer]  utilizing handouts, written materials, and equipment demonstrations.   Signs and Symptoms:  -Group instruction provided by written material and verbal discussion to support subject matter. Warning signs and symptoms of infection, stroke, and heart attack are reviewed and when to call the physician/911 reinforced. Tips for preventing the spread of infection discussed. Flowsheet Row PULMONARY REHAB OTHER RESPIRATORY from 12/15/2017 in [place]  Date 09/01/17  Educator RN  Instruction Review Code 1- Verbalizes Understanding      Advanced Directives:  -Group instruction provided by verbal instruction and written material to support subject matter. Instructor reviews Advanced Directive laws and proper instruction for filling out document.   Pulmonary Video:  -Group video education that reviews the importance of medication and oxygen compliance, exercise, good nutrition, pulmonary hygiene, and pursed lip and diaphragmatic breathing for the pulmonary patient.   Exercise for the Pulmonary Patient:  -Group instruction that is supported by a PowerPoint presentation. Instructor discusses benefits of exercise, core components of exercise, frequency, duration, and intensity of an exercise routine, importance of utilizing pulse oximetry during exercise, safety while exercising, and options of places to exercise outside of rehab.   Flowsheet Row PULMONARY REHAB OTHER RESPIRATORY from 12/15/2017 in [place]  Date 09/22/17  Educator Cloyde Reams   Instruction Review Code 1- Verbalizes Understanding      Pulmonary Medications:  -Verbally interactive group education provided by instructor with focus on inhaled medications and proper administration. Flowsheet Row PULMONARY REHAB OTHER RESPIRATORY from 12/15/2017 in [place]  Date 11/01/17  Educator pharm      Anatomy and Physiology of the Respiratory System and Intimacy:  -Group instruction provided by PowerPoint, verbal discussion, and written material to support subject matter. Instructor reviews respiratory cycle and anatomical components of the respiratory system and their functions. Instructor also reviews differences in obstructive and restrictive respiratory diseases with examples of each. Intimacy, Sex, and Sexuality differences are reviewed with a discussion on how relationships can change when diagnosed with pulmonary disease. Common sexual concerns are reviewed. Flowsheet Row PULMONARY REHAB OTHER RESPIRATORY from 07/03/2020 in [place]  Date 05/03/19  Educator Handout      MD DAY -A group question and answer session with a medical doctor that allows participants to ask questions that relate to their pulmonary disease state. Flowsheet Row PULMONARY REHAB OTHER RESPIRATORY from 12/15/2017 in [place]  Date 12/15/17  Educator yacoub  Instruction Review Code 2- Demonstrated Understanding      OTHER EDUCATION -Group or individual verbal, written, or video instructions that support the educational goals of the pulmonary rehab program. [place] from 07/03/2020 in [place]  Date 07/03/20  Educator DF  [Met levels]  Instruction Review Code 2- Demonstrated Understanding  [handout]      Holiday Eating Survival Tips:  -Group instruction provided by PowerPoint slides, verbal discussion, and written  materials to support subject matter. The instructor gives patients tips, tricks, and techniques to help them not only survive but enjoy the holidays despite the onslaught of food that accompanies the holidays.   Knowledge Questionnaire Score:  Knowledge Questionnaire Score - 06/02/20 1134      Knowledge Questionnaire Score   Pre Score 17/18           Core Components/Risk Factors/Patient Goals at Admission:  Personal Goals and Risk Factors at Admission - 06/02/20 1145  Core Components/Risk Factors/Patient Goals on Admission    Weight Management Obesity    Admit Weight 197 lb (89.4 kg)    Goal Weight: Long Term 185 lb (83.9 kg)    Improve shortness of breath with ADL's Yes    Intervention Provide education, individualized exercise plan and daily activity instruction to help decrease symptoms of SOB with activities of daily living.    Expected Outcomes Short Term: Improve cardiorespiratory fitness to achieve a reduction of symptoms when performing ADLs;Long Term: Be able to perform more ADLs without symptoms or delay the onset of symptoms           Core Components/Risk Factors/Patient Goals Review:   Goals and Risk Factor Review    Row Name 06/02/20 1146 06/10/20 0920 07/07/20 1225         Core Components/Risk Factors/Patient Goals Review   Personal Goals Review Weight Management/Obesity;Develop more efficient breathing techniques such as purse lipped breathing and diaphragmatic breathing and practicing self-pacing with activity.;Increase knowledge of respiratory medications and ability to use respiratory devices properly.;Improve shortness of breath with ADL's Develop more efficient breathing techniques such as purse lipped breathing and diaphragmatic breathing and practicing self-pacing with activity.;Increase knowledge of respiratory medications and ability to use respiratory devices properly.;Improve shortness of breath with ADL's Develop more efficient breathing techniques  such as purse lipped breathing and diaphragmatic breathing and practicing self-pacing with activity.;Increase knowledge of respiratory medications and ability to use respiratory devices properly.;Improve shortness of breath with ADL's     Review -- Has not started program yet, will begin 06/12/2020. Is exercising @ 1.9 mets on the nustep, has lost 1 kg and is trying to lose more, but it has been difficult.  She is using 8-10 liters/min of supplemental oxygen.     Expected Outcomes -- See admission goals. See admission goals.            Core Components/Risk Factors/Patient Goals at Discharge (Final Review):   Goals and Risk Factor Review - 07/07/20 1225      Core Components/Risk Factors/Patient Goals Review   Personal Goals Review Develop more efficient breathing techniques such as purse lipped breathing and diaphragmatic breathing and practicing self-pacing with activity.;Increase knowledge of respiratory medications and ability to use respiratory devices properly.;Improve shortness of breath with ADL's    Review Is exercising @ 1.9 mets on the nustep, has lost 1 kg and is trying to lose more, but it has been difficult.  She is using 8-10 liters/min of supplemental oxygen.    Expected Outcomes See admission goals.           ITP Comments:   Comments:  Leroy Kennedy has completed 6 exercise sessions in Pulmonary rehab. Pt maintains good attendance and consistent home exercise. Pulmonary rehab staff will continue to monitor and reassess progress toward goals during her participation in Pulmonary Rehab.

## 2020-07-17 ENCOUNTER — Encounter (HOSPITAL_COMMUNITY)
Admission: RE | Admit: 2020-07-17 | Discharge: 2020-07-17 | Disposition: A | Discharge status: Home / Self Care | Payer: BC Managed Care – PPO | Source: Ambulatory Visit | Attending: Internal Medicine | Admitting: Internal Medicine

## 2020-07-17 ENCOUNTER — Other Ambulatory Visit: Payer: Self-pay

## 2020-07-17 VITALS — Wt 191.8 lb

## 2020-07-17 DIAGNOSIS — J849 Interstitial pulmonary disease, unspecified: Secondary | ICD-10-CM

## 2020-07-17 NOTE — Progress Notes (Signed)
Daily Session Note  Patient Details  Name: Tasha Ballard MRN: [id number] Date of Birth: 1970/12/24 Referring Provider:   April Manson Pulmonary Rehab Walk Test from 06/02/2020 in [place]  Referring Provider Dr. [name]      Encounter Date: 07/17/2020  Check In:  Session Check In - 07/17/20 1158      Check-In   Supervising physician immediately available to respond to emergencies [facility] immediately available    Physician(s) Dr. Maren Beach    Staff Present Leda Roys, MS, ACSM-CEP, Exercise Physiologist;Other;Gearldene Fiorenza Ysidro Evert, RN;David Makemson, MS, ACSM-CEP, CCRP, Exercise Physiologist    Virtual Visit No    Medication changes reported     No    Fall or balance concerns reported    No    Tobacco Cessation No Change    Warm-up and Cool-down Performed as group-led instruction    Resistance Training Performed No    VAD Patient? No    PAD/SET Patient? No      Pain Assessment   Currently in Pain? No/denies    Pain Score 0-No pain    Multiple Pain Sites No           Capillary Blood Glucose: No results found for this or any previous visit (from the past 24 hour(s)).    Social History   Tobacco Use  Smoking Status Never Smoker  Smokeless Tobacco Never Used    Goals Met:  No report of cardiac concerns or symptoms  Goals Unmet:    Comments: Service time is from 1015 to 1122 When pt arrived today she c/o of feeling very groggy, sleepy and having some dizziness. She has restarted taking her Gabapentin. She also was c/o of nausea. After being on the nu-step she felt nauseated and went to the bathroom and vomited. Gave her gingerale and she rested. She was discharged in stable condition. VS stable.She was still nauseated and groggy.   Dr. Fransico Him is Medical Director for Cardiac Rehab at [facility].

## 2020-07-18 ENCOUNTER — Other Ambulatory Visit: Payer: Self-pay | Admitting: Internal Medicine

## 2020-07-18 ENCOUNTER — Other Ambulatory Visit (HOSPITAL_COMMUNITY): Payer: Self-pay

## 2020-07-18 DIAGNOSIS — J849 Interstitial pulmonary disease, unspecified: Secondary | ICD-10-CM

## 2020-07-18 MED ORDER — ESBRIET 801 MG PO TABS
ORAL_TABLET | ORAL | 11 refills | Status: DC
  Filled 2020-07-18: qty 90, 30d supply, fill #0
  Filled 2020-08-21: qty 90, 30d supply, fill #1
  Filled 2020-09-16: qty 90, 30d supply, fill #2
  Filled 2020-10-14: qty 90, 30d supply, fill #3

## 2020-07-22 ENCOUNTER — Encounter (HOSPITAL_COMMUNITY): Payer: BC Managed Care – PPO

## 2020-07-23 ENCOUNTER — Other Ambulatory Visit (HOSPITAL_COMMUNITY): Payer: Self-pay

## 2020-07-24 ENCOUNTER — Other Ambulatory Visit (HOSPITAL_COMMUNITY): Payer: Self-pay

## 2020-07-24 ENCOUNTER — Encounter (HOSPITAL_COMMUNITY): Payer: Self-pay

## 2020-07-24 ENCOUNTER — Encounter (HOSPITAL_COMMUNITY): Payer: BC Managed Care – PPO

## 2020-07-28 ENCOUNTER — Telehealth: Payer: Self-pay | Admitting: Internal Medicine

## 2020-07-28 DIAGNOSIS — J9611 Chronic respiratory failure with hypoxia: Secondary | ICD-10-CM

## 2020-07-28 DIAGNOSIS — J849 Interstitial pulmonary disease, unspecified: Secondary | ICD-10-CM

## 2020-07-28 NOTE — Telephone Encounter (Signed)
[email or web address]  [email or web address]  Roda Shutters make a referral fo LIEN LYMAN to the [facility] Transplant program. Dr [name] Rocher. I wil also send you the details to your cone email and the RN coordinator is Eddie North  They take higher BMI  They want you to use the redcap to do the referral  Thanks  MR

## 2020-07-29 ENCOUNTER — Encounter (HOSPITAL_COMMUNITY): Payer: Self-pay

## 2020-07-29 ENCOUNTER — Encounter (HOSPITAL_COMMUNITY)
Admission: RE | Admit: 2020-07-29 | Discharge: 2020-07-29 | Disposition: A | Discharge status: Home / Self Care | Payer: BC Managed Care – PPO | Source: Ambulatory Visit | Attending: Internal Medicine | Admitting: Internal Medicine

## 2020-07-29 ENCOUNTER — Other Ambulatory Visit: Payer: Self-pay

## 2020-07-29 DIAGNOSIS — J849 Interstitial pulmonary disease, unspecified: Secondary | ICD-10-CM | POA: Diagnosis present

## 2020-07-29 NOTE — Progress Notes (Signed)
Daily Session Note  Patient Details  Name: Tasha Ballard MRN: [id number] Date of Birth: 04-06-1970 Referring Provider:   April Manson Pulmonary Rehab Walk Test from 06/02/2020 in [place]  Referring Provider Dr. [name]      Encounter Date: 07/29/2020  Check In:  Session Check In - 07/29/20 1150      Check-In   Supervising physician immediately available to respond to emergencies [facility] immediately available    Physician(s) Dr. Tana Coast    Location [facility]    Staff Present Leda Roys, MS, ACSM-CEP, Exercise Physiologist;Other;Ahmaya Ostermiller Lizbeth Bark, MS, ACSM-CEP, CCRP, Exercise Physiologist    Virtual Visit No    Medication changes reported     No    Fall or balance concerns reported    No    Warm-up and Cool-down Performed as group-led instruction    Resistance Training Performed Yes    VAD Patient? No    PAD/SET Patient? No      Pain Assessment   Currently in Pain? No/denies    Multiple Pain Sites No           Capillary Blood Glucose: No results found for this or any previous visit (from the past 24 hour(s)).    Social History   Tobacco Use  Smoking Status Never Smoker  Smokeless Tobacco Never Used    Goals Met:  Exercise tolerated well No report of cardiac concerns or symptoms Strength training completed today  Goals Unmet:  Not Applicable  Comments: Service time is from 1023 to 1125    Dr. Fransico Him is Medical Director for Cardiac Rehab at [facility].

## 2020-07-30 ENCOUNTER — Ambulatory Visit: Payer: BC Managed Care – PPO | Admitting: Internal Medicine

## 2020-07-30 NOTE — Telephone Encounter (Signed)
Referral to [place] transplant program has been placed. I did put websites in the referral as references. Nothing further needed.

## 2020-07-31 ENCOUNTER — Encounter (HOSPITAL_COMMUNITY)
Admission: RE | Admit: 2020-07-31 | Discharge: 2020-07-31 | Disposition: A | Discharge status: Home / Self Care | Payer: BC Managed Care – PPO | Source: Ambulatory Visit | Attending: Internal Medicine | Admitting: Internal Medicine

## 2020-07-31 ENCOUNTER — Encounter (HOSPITAL_COMMUNITY): Payer: Self-pay

## 2020-07-31 ENCOUNTER — Other Ambulatory Visit: Payer: Self-pay

## 2020-07-31 DIAGNOSIS — J849 Interstitial pulmonary disease, unspecified: Secondary | ICD-10-CM | POA: Diagnosis not present

## 2020-07-31 NOTE — Progress Notes (Signed)
Daily Session Note  Patient Details  Name: Tasha Ballard MRN: [id number] Date of Birth: 08/28/1970 Referring Provider:   Flowsheet Row Pulmonary Rehab Walk Test from 06/02/2020 in [facility]  Referring Provider Dr. Ramaswamy       Encounter Date: 07/31/2020  Check In:  Session Check In - 07/31/20 1124       Check-In   Supervising physician immediately available to respond to emergencies [facility] immediately available    Physician(s) Dr. Nettey    Location [facility]    Staff Present Lisa Hughes, RN;Other;Jessica Martin, MS, ACSM-CEP, Exercise Physiologist    Virtual Visit No    Medication changes reported     No    Fall or balance concerns reported    No    Tobacco Cessation No Change    Warm-up and Cool-down Performed as group-led instruction    Resistance Training Performed Yes    VAD Patient? No    PAD/SET Patient? No      Pain Assessment   Currently in Pain? No/denies    Pain Score 0-No pain    Multiple Pain Sites No             Capillary Blood Glucose: No results found for this or any previous visit (from the past 24 hour(s)).    Social History   Tobacco Use  Smoking Status Never  Smokeless Tobacco Never    Goals Met:  Proper associated with RPD/PD & O2 Sat Exercise tolerated well No report of cardiac concerns or symptoms Strength training completed today  Goals Unmet:  Not Applicable  Comments: Service time is from 1015 to 1128    Dr. Traci Turner is Medical Director for Cardiac Rehab at [facility]. 

## 2020-08-01 ENCOUNTER — Other Ambulatory Visit: Payer: Self-pay | Admitting: Adult Health

## 2020-08-01 ENCOUNTER — Telehealth (INDEPENDENT_AMBULATORY_CARE_PROVIDER_SITE_OTHER): Payer: BC Managed Care – PPO | Admitting: Adult Health

## 2020-08-01 ENCOUNTER — Encounter: Payer: Self-pay | Admitting: Adult Health

## 2020-08-01 DIAGNOSIS — J9611 Chronic respiratory failure with hypoxia: Secondary | ICD-10-CM

## 2020-08-01 NOTE — Progress Notes (Signed)
Virtual Visit via Telephone Note  I connected with Tasha Ballard on 08/01/20 at  9:30 AM EDT by telephone and verified that I am speaking with the correct person using two identifiers.  Location: Patient: Home  Provider: Office    I discussed the limitations, risks, security and privacy concerns of performing an evaluation and management service by telephone and the availability of in person appointments. I also discussed with the patient that there may be a patient responsible charge related to this service. The patient expressed understanding and agreed to proceed.   History of Present Illness: 50 year old female never smoker followed for very complicated medical history with interstitial lung disease-progressive NSIP and lung cancer diagnosed in 2021, Chronic respiratory failure on O2   Pulmonary consult 2019 to establish for interstitial lung disease.  Previously followed at [facility].  Underwent surgical lung biopsy 2013 that showed fibrotic NSIP.  Initially treated with CellCept.  Around 2016 had significant decline and was started on oxygen.  She has been evaluated at [facility] with Threasa Alpha in September 2019 felt to have progressive NSIP.  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  This led to progressive respiratory decline and increased symptom burden.  She has been treated with 1 full year of oral and IV Cytoxan from December 2019 to November 2020.  Started on Ofev in December 2019.  Started on Imuran January 2021.  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  With exercise does require higher oxygen over 10 L.  She is on chronic steroids with baseline prednisone at 10 mg daily.  New diagnosis of lung cancer 2021 with a 1 cm left lower lobe nodule  showing growth at May 2021 at 1.5 cm.  PET scan showed marked hypermetabolic activity with SUV at 7.7 there was no evidence of distant disease.  She was felt to high risk for biopsy.  And was for to radiation oncology and underwent SBRT with completion August 24, 2019. Port-A-Cath removed December 2021  03/2020 Covid 19 infection s/p MAB   [facility] Transplant Team evaluation 06/2020    TEST/EVENTS :  High-resolution CT chest April 27, 2019-stable UIP changes no progression since September 2020.  New 1 cm left lower lobe nodule   High-resolution CT chest Jul 19, 2019-interval growth of the left lower lobe nodule 1.5 cm.  UIP changes stable   CT chest October 02, 2019 stable UIP pattern.  Slight interval decrease in the left lung base nodule.  Consistent with treatment response.   January 24, 2020 CT chest underlying changes of fibrotic lung disease, relatively stable groundglass attenuation in left base.  Previous left medial lung nodule measuring 1.2 cm no longer discretely visible.  Possibly secondary to increased consolidative opacity along the left lung base.  Unchanged left upper lobe 7 mm nodule.  Nodular opacity in the medial left base.   PFT January 25, 2020 FEV1 40%, ratio 70, FVC 35%, DLCO 27% PFT November 20, 2018 FEV1 40%, ratio 91, FVC 35%, DLCO 37% PFT March 30, 2018 FEV1 38%, ratio 93%, FVC 32%, DLCO 34%  Todays telemedicine visit is a follow-up visit.  Patient has progressive NSIP is on a complex maintenance regimen including Imuran, Esbriet, Ofev, chronic steroids with prednisone 10 mg daily.  She is on oxygen 5 to  6 L at rest, 8 L with activities such as walking.  And 10 L with heavy exercise at pulmonary rehab.  Patient participates in pulmonary rehab on a regular basis.  She did have COVID-19 infection earlier this year and has been making slow recovery since then.  She says she is back to her pre-COVID infection baseline.  She is back to pulmonary rehab.  She is working on weight loss  and is down another few pounds.  She is try to cut her carbs.  Recent lab work showed normal LFTs Jul 08, 2020.  She says overall she is feeling pretty good.  She is currently being evaluated at [facility] transplant team.  And is going through the work-up evaluation.  One of the transplant goals is she has to get her BMI down further.  She has an appointment with [facility] next week.  She is very excited about this opportunity.  She also has been referred to [facility] to their transplant team as well. We did discuss weight loss she would like to hold off on referral to healthy weight and wellness currently. She denies any flare of her cough wheezing or shortness of breath.  No change in activity tolerance.   Past Medical History:  Diagnosis Date   Aneurysm ([place])    Back pain    Cancer ([place])    Diabetes mellitus without complication ([place])    H/O blood clots    Hypertension    Joint pain    Pulmonary fibrosis ([facility])    SOB (shortness of breath)    Swelling    Thyroid disease    Vitamin D deficiency     Current Outpatient Medications on File Prior to Visit  Medication Sig Dispense Refill   azaTHIOprine (IMURAN) 50 MG tablet TAKE 2 TABLETS (100MG) BY MOUTH EVERY DAY 180 tablet 1   benzonatate (TESSALON) 200 MG capsule Take 1 capsule (200 mg total) by mouth 3 (three) times daily as needed for cough. 30 capsule 1   carvedilol (COREG) 6.25 MG tablet Take 6.25 mg by mouth 2 (two) times daily with a meal.     famotidine (PEPCID) 40 MG tablet TAKE 1 TABLET BY MOUTH EVERY DAY 90 tablet 3   folic acid (FOLVITE) 1 MG tablet Take 1 tablet (1 mg total) by mouth daily. 30 tablet 5   furosemide (LASIX) 40 MG tablet Take 40 mg by mouth daily. may repeat dose if needed for fluid retention.     metFORMIN (GLUCOPHAGE) 500 MG tablet Take 500 mg by mouth 2 (two) times daily.     montelukast (SINGULAIR) 10 MG tablet TAKE 1 TABLET BY MOUTH EVERY DAY AFTER SUPPER 90 tablet 1   OFEV 150 MG CAPS TAKE 1 CAPSULE  TWICE A DAY (12 HOURS APART) WITH FOOD 60 capsule 11   ondansetron (ZOFRAN) 8 MG tablet TAKE 1 TABLET BY MOUTH EVERY 8 HOURS AS NEEDED FOR NAUSEA AND VOMITING (Patient taking differently: Take 8 mg by mouth daily.) 45 tablet 2   OVER THE COUNTER MEDICATION Place 1 application into both nostrils in the morning, at noon, and at bedtime. Saline Solution     Pirfenidone (ESBRIET) 801 MG TABS TAKE 1 TABLET BY MOUTH 3 TIMES DAILY WITH MEALS 90 tablet 11   potassium chloride (KLOR-CON) 10 MEQ tablet TAKE 3 TABLETS BY MOUTH DAILY 90 tablet 2   predniSONE (DELTASONE) 10 MG tablet Take 1 tablet (10 mg total) by mouth daily.     rOPINIRole (REQUIP) 0.25  MG tablet Take 1 tablet (0.25 mg total) by mouth at bedtime. 30 tablet 5   spironolactone (ALDACTONE) 100 MG tablet Take 100 mg by mouth daily.     sulfamethoxazole-trimethoprim (BACTRIM) 400-80 MG tablet Take 1 tablet by mouth every Monday, Wednesday, and Friday.     thyroid (ARMOUR) 90 MG tablet Take 90 mg by mouth daily before breakfast.      Vitamin D, Ergocalciferol, (DRISDOL) 1.25 MG (50000 UT) CAPS capsule TAKE 1 CAPSULE BY MOUTH ONCE WEEKLY 3 capsule 0   No current facility-administered medications on file prior to visit.      Observations/Objective: Speaks in full sentences with no audible distress  Weight 186lbs, BMI 34   Assessment and Plan: ILD with progressive NSIP.  Patient to continue on her aggressive maintenance regimen with Ofev, Esbriet, Imuran, prednisone 10 mg daily.  Continue with pulmonary rehab.  Continue with evaluation at [facility] transplant team. Chronic respiratory failure no change in Oxygen demands.  Continue current settings Continue with pulmonary rehab  Plan  Patient Instructions  Continue on Ofev 150 mg twice daily Continue on prednisone 10 mg daily Continue Imuran  Esbriet as directed.  Continue on oxygen-goal is to keep oxygen levels greater than 88 to 90% Continue with Pulmonary rehab  Work on healthy weight  loss.  Follow-up in 2-3 months with Dr. [name] in ILD clinic.  Please contact office for sooner follow up if symptoms do not improve or worsen or seek emergency care      Follow Up Instructions: Follow up in 2-3 months and As needed      I discussed the assessment and treatment plan with the patient. The patient was provided an opportunity to ask questions and all were answered. The patient agreed with the plan and demonstrated an understanding of the instructions.   The patient was advised to call back or seek an in-person evaluation if the symptoms worsen or if the condition fails to improve as anticipated.  I provided 22  minutes of non-face-to-face time during this encounter.   Rexene Edison, NP

## 2020-08-01 NOTE — Patient Instructions (Addendum)
Continue on Ofev 150 mg twice daily Continue on prednisone 10 mg daily Continue Imuran  Esbriet as directed.  Continue on oxygen-goal is to keep oxygen levels greater than 88 to 90% Continue with Pulmonary rehab  Work on healthy weight loss.  Follow-up in 2-3 months with Dr. [name] in ILD clinic.  Please contact office for sooner follow up if symptoms do not improve or worsen or seek emergency care

## 2020-08-05 ENCOUNTER — Encounter (HOSPITAL_COMMUNITY): Payer: BC Managed Care – PPO

## 2020-08-05 ENCOUNTER — Encounter (HOSPITAL_COMMUNITY): Payer: Self-pay

## 2020-08-05 ENCOUNTER — Other Ambulatory Visit: Payer: Self-pay

## 2020-08-05 ENCOUNTER — Encounter (HOSPITAL_COMMUNITY)
Admission: RE | Admit: 2020-08-05 | Discharge: 2020-08-05 | Disposition: A | Discharge status: Home / Self Care | Payer: BC Managed Care – PPO | Source: Ambulatory Visit | Attending: Internal Medicine | Admitting: Internal Medicine

## 2020-08-05 VITALS — Wt 187.2 lb

## 2020-08-05 DIAGNOSIS — J849 Interstitial pulmonary disease, unspecified: Secondary | ICD-10-CM

## 2020-08-05 NOTE — Progress Notes (Signed)
Daily Session Note  Patient Details  Name: Tasha Ballard MRN: [id number] Date of Birth: 1970/08/06 Referring Provider:   April Manson Pulmonary Rehab Walk Test from 06/02/2020 in [place]  Referring Provider Dr. [name]       Encounter Date: 08/05/2020  Check In:  Session Check In - 08/05/20 1146       Check-In   Supervising physician immediately available to respond to emergencies [facility] immediately available    Physician(s) Dr. Lonny Prude    Location [facility]    Staff Present Leda Roys, MS, ACSM-CEP, Exercise Physiologist;Lisa Ysidro Evert, RN;Other;Joan Leonia Reeves, RN, BSN    Virtual Visit No    Medication changes reported     No    Fall or balance concerns reported    No    Tobacco Cessation No Change    Warm-up and Cool-down Performed as group-led Higher education careers adviser Performed Yes    VAD Patient? No    PAD/SET Patient? No      Pain Assessment   Currently in Pain? No/denies    Pain Score 0-No pain    Multiple Pain Sites No             Capillary Blood Glucose: No results found for this or any previous visit (from the past 24 hour(s)).   Exercise Prescription Changes - 08/05/20 1200       Response to Exercise   Blood Pressure (Admit) 110/80    Blood Pressure (Exercise) 118/70    Blood Pressure (Exit) 132/62    Heart Rate (Admit) 101 bpm    Heart Rate (Exercise) 112 bpm    Heart Rate (Exit) 102 bpm    Oxygen Saturation (Admit) 100 %    Oxygen Saturation (Exercise) 98 %    Oxygen Saturation (Exit) 100 %    Rating of Perceived Exertion (Exercise) 11    Perceived Dyspnea (Exercise) 2    Duration Continue with 30 min of aerobic exercise without signs/symptoms of physical distress.    Intensity THRR unchanged      Progression   Progression Continue to progress workloads to maintain intensity without signs/symptoms of physical distress.      Resistance Training   Training  Prescription Yes    Weight Red bands    Reps 10-15    Time 10 Minutes      Oxygen   Oxygen Continuous    Liters 8      NuStep   Level 4    SPM 80    Minutes 15    METs 1.7      Track   Laps 8    Minutes 15    METs 1.93             Social History   Tobacco Use  Smoking Status Never  Smokeless Tobacco Never    Goals Met:  Proper associated with RPD/PD & O2 Sat Independence with exercise equipment Exercise tolerated well No report of cardiac concerns or symptoms Strength training completed today  Goals Unmet:  Not Applicable  Comments: Service time is from 1023 to 1130    Dr. Fransico Him is Medical Director for Cardiac Rehab at [facility].

## 2020-08-05 NOTE — Progress Notes (Addendum)
I have reviewed a Home Exercise Prescription with Tasha Ballard . [name] is currently exercising at home. [name] states to exercise on the NuStep in her home for 1-2 days/wk for 20 min/day and performs resistance band exercises 1-2 non-rehab days/wk. Encouraged pt to increase time on the NuStep to 30 min/day. The patient was advised to exercise on the NuStep 1-2 non-rehab days a week for 30 minutes.  [name] and I discussed how to progress their exercise prescription.  The patient stated that their goals were to decrease weight to 175 lbs so that she become a candidate for a lung transplant. The patient stated that they understand the exercise prescription.  We reviewed exercise guidelines, target heart rate during exercise, RPE Scale, weather conditions, endpoints for exercise, warmup and cool down.  Patient is encouraged to come to me with any questions. I will continue to follow up with the patient to assist them with progression and safety.     Sheppard Plumber MS

## 2020-08-07 ENCOUNTER — Other Ambulatory Visit: Payer: Self-pay

## 2020-08-07 ENCOUNTER — Encounter (HOSPITAL_COMMUNITY): Payer: Self-pay

## 2020-08-07 ENCOUNTER — Encounter (HOSPITAL_COMMUNITY): Payer: BC Managed Care – PPO

## 2020-08-07 ENCOUNTER — Encounter (HOSPITAL_COMMUNITY)
Admission: RE | Admit: 2020-08-07 | Discharge: 2020-08-07 | Disposition: A | Discharge status: Home / Self Care | Payer: BC Managed Care – PPO | Source: Ambulatory Visit | Attending: Internal Medicine | Admitting: Internal Medicine

## 2020-08-07 DIAGNOSIS — J849 Interstitial pulmonary disease, unspecified: Secondary | ICD-10-CM

## 2020-08-07 NOTE — Progress Notes (Signed)
Daily Session Note  Patient Details  Name: Tasha Ballard MRN: [id number] Date of Birth: 1970-03-17 Referring Provider:   April Manson Pulmonary Rehab Walk Test from 06/02/2020 in [place]  Referring Provider Dr. [name]       Encounter Date: 08/07/2020  Check In:  Session Check In - 08/07/20 1031       Check-In   Supervising physician immediately available to respond to emergencies [facility] immediately available    Physician(s) Dr. Arbutus Ped    Location [facility]    Staff Present Rosebud Poles, RN, BSN;Lisa Ysidro Evert, Felipe Drone, RN, MHA    Virtual Visit No    Medication changes reported     No    Fall or balance concerns reported    No    Tobacco Cessation No Change    Warm-up and Cool-down Performed as group-led instruction    Resistance Training Performed Yes    VAD Patient? No    PAD/SET Patient? No      Pain Assessment   Currently in Pain? No/denies    Multiple Pain Sites No             Capillary Blood Glucose: No results found for this or any previous visit (from the past 24 hour(s)).    Social History   Tobacco Use  Smoking Status Never  Smokeless Tobacco Never    Goals Met:  Proper associated with RPD/PD & O2 Sat Independence with exercise equipment Exercise tolerated well No report of cardiac concerns or symptoms Strength training completed today  Goals Unmet:  Not Applicable  Comments: Service time is from 1015 to 1130    Dr. Fransico Him is Medical Director for Cardiac Rehab at [facility].

## 2020-08-12 ENCOUNTER — Encounter (HOSPITAL_COMMUNITY)
Admission: RE | Admit: 2020-08-12 | Discharge: 2020-08-12 | Disposition: A | Discharge status: Home / Self Care | Payer: BC Managed Care – PPO | Source: Ambulatory Visit | Attending: Internal Medicine | Admitting: Internal Medicine

## 2020-08-12 ENCOUNTER — Encounter (HOSPITAL_COMMUNITY): Payer: BC Managed Care – PPO

## 2020-08-12 ENCOUNTER — Encounter (HOSPITAL_COMMUNITY): Payer: Self-pay

## 2020-08-12 ENCOUNTER — Other Ambulatory Visit: Payer: Self-pay

## 2020-08-12 VITALS — Wt 184.7 lb

## 2020-08-12 DIAGNOSIS — J849 Interstitial pulmonary disease, unspecified: Secondary | ICD-10-CM | POA: Diagnosis not present

## 2020-08-12 NOTE — Progress Notes (Signed)
Pulmonary Individual Treatment Plan  Patient Details  Name: Tasha Ballard MRN: [id number] Date of Birth: 02/16/1971 Referring Provider:   April Manson Pulmonary Rehab Walk Test from 06/02/2020 in [facility]  Referring Provider Dr. [name]       Initial Encounter Date:  Flowsheet Row Pulmonary Rehab Walk Test from 06/02/2020 in [facility]  Date 06/02/20       Visit Diagnosis: ILD (interstitial lung disease) ([place])  Patient's Home Medications on Admission:   Current Outpatient Medications:    azaTHIOprine (IMURAN) 50 MG tablet, TAKE 2 TABLETS (100MG) BY MOUTH EVERY DAY, Disp: 180 tablet, Rfl: 1   benzonatate (TESSALON) 200 MG capsule, Take 1 capsule (200 mg total) by mouth 3 (three) times daily as needed for cough., Disp: 30 capsule, Rfl: 1   carvedilol (COREG) 6.25 MG tablet, Take 6.25 mg by mouth 2 (two) times daily with a meal., Disp: , Rfl:    famotidine (PEPCID) 40 MG tablet, TAKE 1 TABLET BY MOUTH EVERY DAY, Disp: 90 tablet, Rfl: 3   folic acid (FOLVITE) 1 MG tablet, Take 1 tablet (1 mg total) by mouth daily., Disp: 30 tablet, Rfl: 5   furosemide (LASIX) 40 MG tablet, Take 40 mg by mouth daily. may repeat dose if needed for fluid retention., Disp: , Rfl:    metFORMIN (GLUCOPHAGE) 500 MG tablet, Take 500 mg by mouth 2 (two) times daily., Disp: , Rfl:    montelukast (SINGULAIR) 10 MG tablet, TAKE 1 TABLET BY MOUTH EVERY DAY AFTER SUPPER, Disp: 90 tablet, Rfl: 1   OFEV 150 MG CAPS, TAKE 1 CAPSULE TWICE A DAY (12 HOURS APART) WITH FOOD, Disp: 60 capsule, Rfl: 11   ondansetron (ZOFRAN) 8 MG tablet, TAKE 1 TABLET BY MOUTH EVERY 8 HOURS AS NEEDED FOR NAUSEA AND VOMITING (Patient taking differently: Take 8 mg by mouth daily.), Disp: 45 tablet, Rfl: 2   OVER THE COUNTER MEDICATION, Place 1 application into both nostrils in the morning, at noon, and at bedtime. Saline Solution, Disp: , Rfl:    Pirfenidone (ESBRIET) 801 MG  TABS, TAKE 1 TABLET BY MOUTH 3 TIMES DAILY WITH MEALS, Disp: 90 tablet, Rfl: 11   potassium chloride (KLOR-CON) 10 MEQ tablet, TAKE 3 TABLETS BY MOUTH DAILY, Disp: 90 tablet, Rfl: 2   predniSONE (DELTASONE) 10 MG tablet, Take 1 tablet (10 mg total) by mouth daily., Disp: , Rfl:    rOPINIRole (REQUIP) 0.25 MG tablet, Take 1 tablet (0.25 mg total) by mouth at bedtime., Disp: 30 tablet, Rfl: 5   spironolactone (ALDACTONE) 100 MG tablet, Take 100 mg by mouth daily., Disp: , Rfl:    sulfamethoxazole-trimethoprim (BACTRIM) 400-80 MG tablet, Take 1 tablet by mouth every Monday, Wednesday, and Friday., Disp: , Rfl:    thyroid (ARMOUR) 90 MG tablet, Take 90 mg by mouth daily before breakfast. , Disp: , Rfl:    Vitamin D, Ergocalciferol, (DRISDOL) 1.25 MG (50000 UT) CAPS capsule, TAKE 1 CAPSULE BY MOUTH ONCE WEEKLY, Disp: 3 capsule, Rfl: 0  Past Medical History: Past Medical History:  Diagnosis Date   Aneurysm ([place])    Back pain    Cancer ([place])    Diabetes mellitus without complication ([place])    H/O blood clots    Hypertension    Joint pain    Pulmonary fibrosis ([facility])    SOB (shortness of breath)    Swelling    Thyroid disease    Vitamin D deficiency  Tobacco Use: Social History   Tobacco Use  Smoking Status Never  Smokeless Tobacco Never    Labs: Recent Review Flowsheet Data     Labs for ITP Cardiac and Pulmonary Rehab Latest Ref Rng & Units 02/13/2018 06/25/2020 06/25/2020   Cholestrol 100 - 199 mg/dL 295(H) - -   LDLCALC 0 - 99 mg/dL 186(H) - -   HDL >39 mg/dL 85 - -   Trlycerides 0 - 149 mg/dL 120 - -   Hemoglobin A1c 4.8 - 5.6 % 5.6 - -   HCO3 20.0 - 28.0 mmol/L - 38.3(H) 37.1(H)   TCO2 22 - 32 mmol/L - 40(H) 39(H)   O2SAT % - 70.0 67.0       Capillary Blood Glucose: Lab Results  Component Value Date   GLUCAP 126 (H) 06/25/2020   GLUCAP 130 (H) 02/19/2020   GLUCAP 126 (H) 12/28/2019   GLUCAP 99 12/28/2019   GLUCAP 88 12/28/2019     Pulmonary Assessment Scores:   Pulmonary Assessment Scores     Row Name 06/02/20 1132 06/02/20 1221       ADL UCSD   ADL Phase Entry --    SOB Score total 77 --         CAT Score      CAT Score 24 --         mMRC Score      mMRC Score -- 3           UCSD: Self-administered rating of dyspnea associated with activities of daily living (ADLs) 6-point scale (0 = "not at all" to 5 = "maximal or unable to do because of breathlessness")  Scoring Scores range from 0 to 120.  Minimally important difference is 5 units  CAT: CAT can identify the health impairment of COPD patients and is better correlated with disease progression.  CAT has a scoring range of zero to 40. The CAT score is classified into four groups of low (less than 10), medium (10 - 20), high (21-30) and very high (31-40) based on the impact level of disease on health status. A CAT score over 10 suggests significant symptoms.  A worsening CAT score could be explained by an exacerbation, poor medication adherence, poor inhaler technique, or progression of COPD or comorbid conditions.  CAT MCID is 2 points  mMRC: mMRC (Modified Medical Research Council) Dyspnea Scale is used to assess the degree of baseline functional disability in patients of respiratory disease due to dyspnea. No minimal important difference is established. A decrease in score of 1 point or greater is considered a positive change.   Pulmonary Function Assessment:  Pulmonary Function Assessment - 06/02/20 1131       Breath   Bilateral Breath Sounds Clear    Shortness of Breath Yes;Fear of Shortness of Breath;Limiting activity             Exercise Target Goals: Exercise Program Goal: Individual exercise prescription set using results from initial 6 min walk test and THRR while considering  patient's activity barriers and safety.   Exercise Prescription Goal: Initial exercise prescription builds to 30-45 minutes a day of aerobic activity, 2-3 days per week.  Home exercise  guidelines will be given to patient during program as part of exercise prescription that the participant will acknowledge.  Activity Barriers & Risk Stratification:  Activity Barriers & Cardiac Risk Stratification - 06/02/20 1115       Activity Barriers & Cardiac Risk Stratification   Activity Barriers Deconditioning;Muscular Weakness;Shortness of  Breath             6 Minute Walk:  6 Minute Walk     Row Name 06/02/20 1221         6 Minute Walk   Phase Initial     Distance 1025 feet     Walk Time 6 minutes     # of Rest Breaks 0     MPH 1.94     METS 3.65     RPE 11     Perceived Dyspnea  1.5     VO2 Peak 12.79     Symptoms No     Resting HR 97 bpm     Resting BP 120/88     Resting Oxygen Saturation  99 %     Exercise Oxygen Saturation  during 6 min walk 87 %     Max Ex. HR 134 bpm     Max Ex. BP 170/98     2 Minute Post BP 154/90           Interval HR     1 Minute HR 118     2 Minute HR 124     3 Minute HR 134     4 Minute HR 127     5 Minute HR 130     6 Minute HR 134     2 Minute Post HR 111     Interval Heart Rate? Yes           Interval Oxygen     Interval Oxygen? Yes     Baseline Oxygen Saturation % 99 %     1 Minute Oxygen Saturation % 96 %     1 Minute Liters of Oxygen 10 L     2 Minute Oxygen Saturation % 91 %     2 Minute Liters of Oxygen 10 L     3 Minute Oxygen Saturation % 87 %     3 Minute Liters of Oxygen 10 L  Increased to 15L     4 Minute Oxygen Saturation % 93 %     4 Minute Liters of Oxygen 15 L     5 Minute Oxygen Saturation % 93 %     5 Minute Liters of Oxygen 15 L     6 Minute Oxygen Saturation % 93 %     6 Minute Liters of Oxygen 15 L     2 Minute Post Oxygen Saturation % 99 %     2 Minute Post Liters of Oxygen 10 L             Oxygen Initial Assessment:  Oxygen Initial Assessment - 06/02/20 1128       Home Oxygen   Home Oxygen Device Home Concentrator;E-Tanks    Sleep Oxygen Prescription Continuous    Liters per  minute 8    Home Exercise Oxygen Prescription Continuous    Liters per minute 12    Home Resting Oxygen Prescription Continuous    Liters per minute 6    Compliance with Home Oxygen Use Yes      Initial 6 min Walk   Oxygen Used Continuous;E-Tanks    Liters per minute 10   Had to increase to 15L     Program Oxygen Prescription   Program Oxygen Prescription Continuous;E-Tanks    Liters per minute 10    Comments 10L for seated, 15L for walking      Intervention   Short Term Goals To learn and  exhibit compliance with exercise, home and travel O2 prescription;To learn and understand importance of monitoring SPO2 with pulse oximeter and demonstrate accurate use of the pulse oximeter.;To learn and understand importance of maintaining oxygen saturations>88%;To learn and demonstrate proper pursed lip breathing techniques or other breathing techniques. ;To learn and demonstrate proper use of respiratory medications    Long  Term Goals Exhibits compliance with exercise, home  and travel O2 prescription;Verbalizes importance of monitoring SPO2 with pulse oximeter and return demonstration;Maintenance of O2 saturations>88%;Exhibits proper breathing techniques, such as pursed lip breathing or other method taught during program session;Compliance with respiratory medication;Demonstrates proper use of MDI's             Oxygen Re-Evaluation:  Oxygen Re-Evaluation     Row Name 06/13/20 1456 07/10/20 0906 08/11/20 0742         Program Oxygen Prescription   Program Oxygen Prescription Continuous;E-Tanks Continuous;E-Tanks Continuous;E-Tanks     Liters per minute 10 -2 8     Comments We originally thought pt would need 15L of oxygen on the treadmill due to desaturation on the walk test but so far she has required 10L for seated exercise and for walking on the treadmill. Pt needs 8L for seated exercise and 10L with walking Pt needs 8L for seated exercise and 10L with walking           Home Oxygen        Home Oxygen Device Home Concentrator;E-Tanks Home Concentrator;E-Tanks Home Concentrator;E-Tanks     Sleep Oxygen Prescription Continuous Continuous Continuous     Liters per minute -- 8 8     Home Exercise Oxygen Prescription Continuous Continuous Continuous     Liters per minute _0 Home Resting Oxygen Prescription Continuous Continuous Continuous     Liters per minute _1 Compliance with Home Oxygen Use Yes Yes Yes           Goals/Expected Outcomes       Short Term Goals To learn and exhibit compliance with exercise, home and travel O2 prescription;To learn and understand importance of monitoring SPO2 with pulse oximeter and demonstrate accurate use of the pulse oximeter.;To learn and understand importance of maintaining oxygen saturations>88%;To learn and demonstrate proper pursed lip breathing techniques or other breathing techniques. ;To learn and demonstrate proper use of respiratory medications To learn and exhibit compliance with exercise, home and travel O2 prescription;To learn and understand importance of monitoring SPO2 with pulse oximeter and demonstrate accurate use of the pulse oximeter.;To learn and understand importance of maintaining oxygen saturations>88%;To learn and demonstrate proper pursed lip breathing techniques or other breathing techniques. ;To learn and demonstrate proper use of respiratory medications To learn and exhibit compliance with exercise, home and travel O2 prescription;To learn and understand importance of monitoring SPO2 with pulse oximeter and demonstrate accurate use of the pulse oximeter.;To learn and understand importance of maintaining oxygen saturations>88%;To learn and demonstrate proper pursed lip breathing techniques or other breathing techniques. ;To learn and demonstrate proper use of respiratory medications     Long  Term Goals Exhibits compliance with exercise, home  and travel O2 prescription;Verbalizes importance of monitoring SPO2  with pulse oximeter and return demonstration;Maintenance of O2 saturations>88%;Exhibits proper breathing techniques, such as pursed lip breathing or other method taught during program session;Compliance with respiratory medication;Demonstrates proper use of MDI's Exhibits compliance with exercise, home  and travel O2 prescription;Verbalizes importance of monitoring SPO2 with pulse oximeter and return demonstration;Maintenance  of O2 saturations>88%;Exhibits proper breathing techniques, such as pursed lip breathing or other method taught during program session;Compliance with respiratory medication;Demonstrates proper use of MDI's Exhibits compliance with exercise, home  and travel O2 prescription;Verbalizes importance of monitoring SPO2 with pulse oximeter and return demonstration;Maintenance of O2 saturations>88%;Exhibits proper breathing techniques, such as pursed lip breathing or other method taught during program session;Compliance with respiratory medication;Demonstrates proper use of MDI's     Goals/Expected Outcomes Compliance and understanding of oxygen saturation and pursed lip breathing Compliance and understanding of oxygen saturation and pursed lip breathing Compliance and understanding of oxygen saturation and pursed lip breathing             Oxygen Discharge (Final Oxygen Re-Evaluation):  Oxygen Re-Evaluation - 08/11/20 0742       Program Oxygen Prescription   Program Oxygen Prescription Continuous;E-Tanks    Liters per minute 8    Comments Pt needs 8L for seated exercise and 10L with walking      Home Oxygen   Home Oxygen Device Home Concentrator;E-Tanks    Sleep Oxygen Prescription Continuous    Liters per minute 8    Home Exercise Oxygen Prescription Continuous    Liters per minute 10    Home Resting Oxygen Prescription Continuous    Liters per minute 6    Compliance with Home Oxygen Use Yes      Goals/Expected Outcomes   Short Term Goals To learn and exhibit compliance  with exercise, home and travel O2 prescription;To learn and understand importance of monitoring SPO2 with pulse oximeter and demonstrate accurate use of the pulse oximeter.;To learn and understand importance of maintaining oxygen saturations>88%;To learn and demonstrate proper pursed lip breathing techniques or other breathing techniques. ;To learn and demonstrate proper use of respiratory medications    Long  Term Goals Exhibits compliance with exercise, home  and travel O2 prescription;Verbalizes importance of monitoring SPO2 with pulse oximeter and return demonstration;Maintenance of O2 saturations>88%;Exhibits proper breathing techniques, such as pursed lip breathing or other method taught during program session;Compliance with respiratory medication;Demonstrates proper use of MDI's    Goals/Expected Outcomes Compliance and understanding of oxygen saturation and pursed lip breathing             Initial Exercise Prescription:  Initial Exercise Prescription - 06/02/20 1300       Date of Initial Exercise RX and Referring Provider   Date 06/02/20    Referring Provider Dr. [name]    Expected Discharge Date 08/07/20      Oxygen   Oxygen Continuous    Liters 10-15      Treadmill   MPH 1.6    Grade 0    Minutes 15      NuStep   Level 2    SPM 80    Minutes 15      Prescription Details   Frequency (times per week) 2    Duration Progress to 30 minutes of continuous aerobic without signs/symptoms of physical distress      Intensity   THRR 40-80% of Max Heartrate 68-135    Ratings of Perceived Exertion 11-13    Perceived Dyspnea 0-4      Progression   Progression Continue to progress workloads to maintain intensity without signs/symptoms of physical distress.      Resistance Training   Training Prescription Yes    Weight Red bands    Reps 10-15             Perform Capillary Blood Glucose checks as  needed.  Exercise Prescription Changes:   Exercise Prescription  Changes     Row Name 06/24/20 1200 07/08/20 1100 07/17/20 1151 08/05/20 1200       Response to Exercise   Blood Pressure (Admit) 124/80 142/90 140/80 110/80    Blood Pressure (Exercise) 110/74 -- -- 118/70    Blood Pressure (Exit) 112/80 138/84 120/80 132/62    Heart Rate (Admit) 94 bpm 103 bpm 104 bpm 101 bpm    Heart Rate (Exercise) 115 bpm 119 bpm 105 bpm 112 bpm    Heart Rate (Exit) 102 bpm 104 bpm 92 bpm 102 bpm    Oxygen Saturation (Admit) 99 % 98 % 93 % 100 %    Oxygen Saturation (Exercise) 97 % 89 % 98 % 98 %    Oxygen Saturation (Exit) 99 % 97 % 99 % 100 %    Rating of Perceived Exertion (Exercise) _0 Perceived Dyspnea (Exercise) _1 Duration Continue with 30 min of aerobic exercise without signs/symptoms of physical distress. Continue with 30 min of aerobic exercise without signs/symptoms of physical distress. Continue with 30 min of aerobic exercise without signs/symptoms of physical distress. Continue with 30 min of aerobic exercise without signs/symptoms of physical distress.    Intensity THRR unchanged THRR unchanged THRR unchanged THRR unchanged         Progression        Progression Continue to progress workloads to maintain intensity without signs/symptoms of physical distress. Continue to progress workloads to maintain intensity without signs/symptoms of physical distress. Continue to progress workloads to maintain intensity without signs/symptoms of physical distress. Continue to progress workloads to maintain intensity without signs/symptoms of physical distress.         Resistance Training        Training Prescription Yes Yes Yes Yes    Weight Red bands red bands red bands Red bands    Reps 10-15 10-15 10-15 10-15    Time 10 Minutes 10 Minutes 10 Minutes 10 Minutes         Oxygen        Oxygen Continuous Continuous Continuous Continuous    Liters 10-15 10-15 8-10 8         Treadmill        MPH 1.6 -- -- --    Grade 0 -- -- --    Minutes  15 -- -- --         NuStep        Level _2 SPM 80 80 80 80    Minutes 15 36 15 15    METs 1.5 1.9 1.7  become nauseated and dizzy from a new medication and only exercised 15 minutes. 1.7         Track        Laps -- -- -- 8    Minutes -- -- -- 15    METs -- -- -- 1.93         Home Exercise Plan        Plans to continue exercise at -- -- -- Home (comment)  Pt exercises on NuStep at home    Frequency -- -- -- Add 1 additional day to program exercise sessions.    Initial Home Exercises Provided -- -- -- 08/05/20            Exercise Comments:   Exercise Comments     Row Name  08/05/20 1211           Exercise Comments Completed home exercise plan. Pt states to exercise on the NuStep 1-2 non-rehab days/wk for 20 min/day and performs resistance band exercises 1-2 non-rehab days/wk. Encouraged pt to continue exercise on the NuStep for 1-2 non-rehab days/wk for 30 min/day. Encouraged pt continue performing resistance band exercises. Pt exercises at a moderate intensity that is within her target heart rate range of 68-135 bpm.                Exercise Goals and Review:   Exercise Goals     Row Name 06/02/20 1218             Exercise Goals   Increase Physical Activity Yes       Intervention Provide advice, education, support and counseling about physical activity/exercise needs.;Develop an individualized exercise prescription for aerobic and resistive training based on initial evaluation findings, risk stratification, comorbidities and participant's personal goals.       Expected Outcomes Short Term: Attend rehab on a regular basis to increase amount of physical activity.;Long Term: Add in home exercise to make exercise part of routine and to increase amount of physical activity.;Long Term: Exercising regularly at least 3-5 days a week.       Increase Strength and Stamina Yes       Intervention Provide advice, education, support and counseling about physical  activity/exercise needs.;Develop an individualized exercise prescription for aerobic and resistive training based on initial evaluation findings, risk stratification, comorbidities and participant's personal goals.       Expected Outcomes Short Term: Increase workloads from initial exercise prescription for resistance, speed, and METs.;Short Term: Perform resistance training exercises routinely during rehab and add in resistance training at home;Long Term: Improve cardiorespiratory fitness, muscular endurance and strength as measured by increased METs and functional capacity (6MWT)       Able to understand and use rate of perceived exertion (RPE) scale Yes       Intervention Provide education and explanation on how to use RPE scale       Expected Outcomes Short Term: Able to use RPE daily in rehab to express subjective intensity level;Long Term:  Able to use RPE to guide intensity level when exercising independently       Able to understand and use Dyspnea scale Yes       Intervention Provide education and explanation on how to use Dyspnea scale       Expected Outcomes Short Term: Able to use Dyspnea scale daily in rehab to express subjective sense of shortness of breath during exertion;Long Term: Able to use Dyspnea scale to guide intensity level when exercising independently       Knowledge and understanding of Target Heart Rate Range (THRR) Yes       Intervention Provide education and explanation of THRR including how the numbers were predicted and where they are located for reference       Expected Outcomes Short Term: Able to state/look up THRR;Long Term: Able to use THRR to govern intensity when exercising independently;Short Term: Able to use daily as guideline for intensity in rehab       Understanding of Exercise Prescription Yes       Intervention Provide education, explanation, and written materials on patient's individual exercise prescription       Expected Outcomes Short Term: Able to  explain program exercise prescription;Long Term: Able to explain home exercise prescription to exercise independently  Exercise Goals Re-Evaluation :  Exercise Goals Re-Evaluation     Row Name 06/13/20 1451 07/10/20 0903 08/11/20 0737         Exercise Goal Re-Evaluation   Exercise Goals Review Increase Physical Activity;Increase Strength and Stamina;Able to understand and use rate of perceived exertion (RPE) scale;Able to understand and use Dyspnea scale;Knowledge and understanding of Target Heart Rate Range (THRR);Understanding of Exercise Prescription Increase Physical Activity;Increase Strength and Stamina;Able to understand and use rate of perceived exertion (RPE) scale;Able to understand and use Dyspnea scale;Knowledge and understanding of Target Heart Rate Range (THRR);Understanding of Exercise Prescription Increase Physical Activity;Increase Strength and Stamina;Able to understand and use rate of perceived exertion (RPE) scale;Able to understand and use Dyspnea scale;Knowledge and understanding of Target Heart Rate Range (THRR);Understanding of Exercise Prescription     Comments Leroy Kennedy has completed 1 exercise session so far, which is too early to note any progression. She tolerated her first day of exercie well. We had originally thought she would need 15L of oxygen while walking on the treadmill due to desaturation on walk test, but she only required 10L on the treadmill on her first day of exercise. We will continue to monitor oxygen requirements with exertion. She is exercising at 1.5 METS on the Nustep and 2.2 METS on the treadmill. Will continue to monitor and progress as she is able. Leroy Kennedy has completed 6 exercise sessions and has already made progression with workload and MET level increases. She is exercising at 1.9 METS on the Nustep and 2.2 METS on the treadmill. Will continue to monitor and progress as she is able. Leroy Kennedy has completed 12 exercise sessions and  has been consistent with workload and MET level increases. She has had a set back recently due to being sick, but she is recovering and getting back to her normal workloads. She is a transplant candidat so we have had to change her exercise prescription to meet those needs. She now needs to walk on the track for at least 20 minutes and can do the Nustep for 10-15 minutes. She has a Nustep at home so she is able to do that outside of rehab, but she is required to walk at least 20 minutes 3 days a week and it has to be on level ground, cannot be on a treadmill. Pt is currently exercising at 1.7 METS on the Nustep. Will continue to monitor and progress as she is able.     Expected Outcomes Through exercise at rehab and at home, the patient will decrease shortness of breath with daily activities and feel confident in carrying out an exercise regime at home.  Through exercise at rehab and at home, the patient will decrease shortness of breath with daily activities and feel confident in carrying out an exercise regime at home.  Through exercise at rehab and at home, the patient will decrease shortness of breath with daily activities and feel confident in carrying out an exercise regime at home.               Discharge Exercise Prescription (Final Exercise Prescription Changes):  Exercise Prescription Changes - 08/05/20 1200       Response to Exercise   Blood Pressure (Admit) 110/80    Blood Pressure (Exercise) 118/70    Blood Pressure (Exit) 132/62    Heart Rate (Admit) 101 bpm    Heart Rate (Exercise) 112 bpm    Heart Rate (Exit) 102 bpm    Oxygen Saturation (Admit) 100 %  Oxygen Saturation (Exercise) 98 %    Oxygen Saturation (Exit) 100 %    Rating of Perceived Exertion (Exercise) 11    Perceived Dyspnea (Exercise) 2    Duration Continue with 30 min of aerobic exercise without signs/symptoms of physical distress.    Intensity THRR unchanged      Progression   Progression Continue to  progress workloads to maintain intensity without signs/symptoms of physical distress.      Resistance Training   Training Prescription Yes    Weight Red bands    Reps 10-15    Time 10 Minutes      Oxygen   Oxygen Continuous    Liters 8      NuStep   Level 4    SPM 80    Minutes 15    METs 1.7      Track   Laps 8    Minutes 15    METs 1.93      Home Exercise Plan   Plans to continue exercise at Home (comment)   Pt exercises on NuStep at home   Frequency Add 1 additional day to program exercise sessions.    Initial Home Exercises Provided 08/05/20             Nutrition:  Target Goals: Understanding of nutrition guidelines, daily intake of sodium <1570m, cholesterol <2063m calories 30% from fat and 7% or less from saturated fats, daily to have 5 or more servings of fruits and vegetables.  Biometrics:  Pre Biometrics - 06/02/20 1115       Pre Biometrics   Height _0  (1.575 m)    Weight 89.4 kg    BMI (Calculated) 36.04    Grip Strength 19 kg              Nutrition Therapy Plan and Nutrition Goals:  Nutrition Therapy & Goals - 06/17/20 1215       Nutrition Therapy   Diet TLC      Personal Nutrition Goals   Nutrition Goal Pt to identify food quantities necessary to achieve weight loss of 6-24 lb at graduation from cardiac rehab.      Intervention Plan   Intervention Prescribe, educate and counsel regarding individualized specific dietary modifications aiming towards targeted core components such as weight, hypertension, lipid management, diabetes, heart failure and other comorbidities.;Nutrition handout(s) given to patient.    Expected Outcomes Short Term Goal: A plan has been developed with personal nutrition goals set during dietitian appointment.;Long Term Goal: Adherence to prescribed nutrition plan.             Nutrition Assessments:  MEDIFICTS Score Key: ?70 Need to make dietary changes  40-70 Heart Healthy Diet ? 40 Therapeutic Level  Cholesterol Diet  Flowsheet Row PULMONARY REHAB OTHER RESPIRAT[facility] [place] on [place] 52      Picture Your Plate Scores: <4<88nhealthy dietary pattern with much room for improvement. 41-50 Dietary pattern unlikely to meet recommendations for good health and room for improvement. 51-60 More healthful dietary pattern, with some room for improvement.  >60 Healthy dietary pattern, although there may be some specific behaviors that could be improved.    Nutrition Goals Re-Evaluation:  Nutrition Goals Re-Evaluation     [place] 06/17/20 1216 07/14/20 1352 08/12/20 0643         Goals   Current Weight 197 lb (89.4 kg) 193 lb 5.5 oz (87.7 kg) 184 lb  11.9 oz (83.8 kg)     Nutrition Goal Pt to identify food quantities necessary to achieve weight loss of 6-24 lb at graduation from cardiac rehab. Pt to identify food quantities necessary to achieve weight loss of 6-24 lb at graduation from cardiac rehab. Pt to identify food quantities necessary to achieve weight loss of 6-24 lb at graduation from cardiac rehab.     Comment -- 4 lbs weight loss Pt has lost 11 lbs           Personal Goal #2 Re-Evaluation       Personal Goal #2 -- Pt to build a healthy plate including vegetables, fruits, whole grains, and low-fat dairy products in a heart healthy meal plan. Pt to build a healthy plate including vegetables, fruits, whole grains, and low-fat dairy products in a heart healthy meal plan.             Nutrition Goals Discharge (Final Nutrition Goals Re-Evaluation):  Nutrition Goals Re-Evaluation - 08/12/20 0643       Goals   Current Weight 184 lb 11.9 oz (83.8 kg)    Nutrition Goal Pt to identify food quantities necessary to achieve weight loss of 6-24 lb at graduation from cardiac rehab.    Comment Pt has lost 11 lbs      Personal Goal #2 Re-Evaluation   Personal Goal #2 Pt to build a healthy plate including  vegetables, fruits, whole grains, and low-fat dairy products in a heart healthy meal plan.             Psychosocial: Target Goals: Acknowledge presence or absence of significant depression and/or stress, maximize coping skills, provide positive support system. Participant is able to verbalize types and ability to use techniques and skills needed for reducing stress and depression.  Initial Review & Psychosocial Screening:  Initial Psych Review & Screening - 06/02/20 1135       Initial Review   Current issues with Current Stress Concerns   Is going through the lung transplant evaluation at [place]? Yes      Barriers   Psychosocial barriers to participate in program The patient should benefit from training in stress management and relaxation.      Screening Interventions   Interventions Encouraged to exercise    Expected Outcomes Long Term Goal: Stressors or current issues are controlled or eliminated.             Quality of Life Scores:  Scores of 19 and below usually indicate a poorer quality of life in these areas.  A difference of  2-3 points is a clinically meaningful difference.  A difference of 2-3 points in the total score of the Quality of Life Index has been associated with significant improvement in overall quality of life, self-image, physical symptoms, and general health in studies assessing change in quality of life.  PHQ-9: Recent Review Flowsheet Data     Depression screen [facility] 2/9 06/02/2020   Decreased Interest 0   Down, Depressed, Hopeless 0   PHQ - 2 Score 0   Altered sleeping 0   Tired, decreased energy 2   Change in appetite 1   Feeling bad or failure about yourself  0   Trouble concentrating 0   Moving slowly or fidgety/restless 0   Suicidal thoughts 0   PHQ-9 Score 3   Difficult doing work/chores Not difficult at all  Interpretation of Total  Score  Total Score Depression Severity:  1-4 = Minimal depression, 5-9 = Mild depression, 10-14 = Moderate depression, 15-19 = Moderately severe depression, 20-27 = Severe depression   Psychosocial Evaluation and Intervention:  Psychosocial Evaluation - 06/02/20 1144       Psychosocial Evaluation & Interventions   Interventions Stress management education;Relaxation education;Encouraged to exercise with the program and follow exercise prescription    Continue Psychosocial Services  No Follow up required             Psychosocial Re-Evaluation:  Psychosocial Re-Evaluation     [place] Name 06/10/20 0919 07/07/20 1223 08/11/20 1311         Psychosocial Re-Evaluation   Current issues with -- Current Stress Concerns Current Stress Concerns     Comments No change since orientation, she begins pulmonary rehab 06/12/2020. Is dealing with being evaluated for a lung transplant at [facility] and this is stressful.  She is handling it well and has a very supportive family. Continues to deal with the stress of chronic health issues and the evaluation process of possible lung transplant @ [facility].  She is handling it well with a positive outlook.     Expected Outcomes For [name] to handle her stress in healthy ways.  She is being evaluated for a lung transplant. -- For Christy to continue to handle her stress in healthy ways.     Interventions Encouraged to attend Pulmonary Rehabilitation for the exercise;Relaxation education;Stress management education Stress management education;Relaxation education;Encouraged to attend Pulmonary Rehabilitation for the exercise Encouraged to attend Pulmonary Rehabilitation for the exercise;Relaxation education;Stress management education     Continue Psychosocial Services  No Follow up required Follow up required by staff Follow up required by staff           Initial Review       Source of Stress Concerns -- Chronic Illness;Unable to participate in former  interests or hobbies;Unable to perform yard/household activities Chronic Illness;Unable to participate in former interests or hobbies;Unable to perform yard/household activities     Comments -- -- Continue to monitor for psychosocial concerns while particpating in pulmonary rehab.             Psychosocial Discharge (Final Psychosocial Re-Evaluation):  Psychosocial Re-Evaluation - 08/11/20 1311       Psychosocial Re-Evaluation   Current issues with Current Stress Concerns    Comments Continues to deal with the stress of chronic health issues and the evaluation process of possible lung transplant @ [facility].  She is handling it well with a positive outlook.    Expected Outcomes For Christy to continue to handle her stress in healthy ways.    Interventions Encouraged to attend Pulmonary Rehabilitation for the exercise;Relaxation education;Stress management education    Continue Psychosocial Services  Follow up required by staff      Initial Review   Source of Stress Concerns Chronic Illness;Unable to participate in former interests or hobbies;Unable to perform yard/household activities    Comments Continue to monitor for psychosocial concerns while particpating in pulmonary rehab.             Education: Education Goals: Education classes will be provided on a weekly basis, covering required topics. Participant will state understanding/return demonstration of topics presented.  Learning Barriers/Preferences:  Learning Barriers/Preferences - 06/02/20 1145       Learning Barriers/Preferences   Learning Barriers None    Learning Preferences Computer/Internet  Education Topics: Risk Factor Reduction:  -Group instruction that is supported by a PowerPoint presentation. Instructor discusses the definition of a risk factor, different risk factors for pulmonary disease, and how the heart and lungs work together.   Flowsheet Row PULMONARY REHAB OTHER RESPIRATORY from  08/07/2020 in [place]  Date 05/31/19  Educator DF  Instruction Review Code 2- Demonstrated Understanding       Nutrition for Pulmonary Patient:  -Group instruction provided by PowerPoint slides, verbal discussion, and written materials to support subject matter. The instructor gives an explanation and review of healthy diet recommendations, which includes a discussion on weight management, recommendations for fruit and vegetable consumption, as well as protein, fluid, caffeine, fiber, sodium, sugar, and alcohol. Tips for eating when patients are short of breath are discussed. Flowsheet Row PULMONARY REHAB OTHER RESPIRATORY from 08/07/2020 in [place]  Date 08/07/20  Educator H/O  Instruction Review Code 1- Verbalizes Understanding       Pursed Lip Breathing:  -Group instruction that is supported by demonstration and informational handouts. Instructor discusses the benefits of pursed lip and diaphragmatic breathing and detailed demonstration on how to preform both.     Oxygen Safety:  -Group instruction provided by PowerPoint, verbal discussion, and written material to support subject matter. There is an overview of "What is Oxygen" and "Why do we need it".  Instructor also reviews how to create a safe environment for oxygen use, the importance of using oxygen as prescribed, and the risks of noncompliance. There is a brief discussion on traveling with oxygen and resources the patient may utilize. Flowsheet Row PULMONARY REHAB OTHER RESPIRATORY from 08/07/2020 in [place]  Date 07/31/20  Educator Jess-H/O  Instruction Review Code 1- Verbalizes Understanding       Oxygen Equipment:  -Group instruction provided by [employer] Staff utilizing handouts, written materials, and Insurance underwriter.   Signs and Symptoms:  -Group instruction provided by written material and verbal  discussion to support subject matter. Warning signs and symptoms of infection, stroke, and heart attack are reviewed and when to call the physician/911 reinforced. Tips for preventing the spread of infection discussed. Flowsheet Row PULMONARY REHAB OTHER RESPIRATORY from 12/15/2017 in [place]  Date 09/01/17  Educator RN  Instruction Review Code 1- Verbalizes Understanding       Advanced Directives:  -Group instruction provided by verbal instruction and written material to support subject matter. Instructor reviews Advanced Directive laws and proper instruction for filling out document.   Pulmonary Video:  -Group video education that reviews the importance of medication and oxygen compliance, exercise, good nutrition, pulmonary hygiene, and pursed lip and diaphragmatic breathing for the pulmonary patient.   Exercise for the Pulmonary Patient:  -Group instruction that is supported by a PowerPoint presentation. Instructor discusses benefits of exercise, core components of exercise, frequency, duration, and intensity of an exercise routine, importance of utilizing pulse oximetry during exercise, safety while exercising, and options of places to exercise outside of rehab.   Flowsheet Row PULMONARY REHAB OTHER RESPIRATORY from 12/15/2017 in [place]  Date 09/22/17  Educator Cloyde Reams  Instruction Review Code 1- Verbalizes Understanding       Pulmonary Medications:  -Verbally interactive group education provided by instructor with focus on inhaled medications and proper administration. Flowsheet Row PULMONARY REHAB OTHER RESPIRATORY from 12/15/2017 in [place]  Date 11/01/17  Educator  pharm       Anatomy and Physiology of the Respiratory System and Intimacy:  -Group instruction provided by PowerPoint, verbal discussion, and written material to support subject matter. Instructor reviews  respiratory cycle and anatomical components of the respiratory system and their functions. Instructor also reviews differences in obstructive and restrictive respiratory diseases with examples of each. Intimacy, Sex, and Sexuality differences are reviewed with a discussion on how relationships can change when diagnosed with pulmonary disease. Common sexual concerns are reviewed. Flowsheet Row PULMONARY REHAB OTHER RESPIRATORY from 08/07/2020 in [place]  Date 05/03/19  Educator Handout       MD DAY -A group question and answer session with a medical doctor that allows participants to ask questions that relate to their pulmonary disease state. Flowsheet Row PULMONARY REHAB OTHER RESPIRATORY from 12/15/2017 in [place]  Date 12/15/17  Educator yacoub  Instruction Review Code 2- Demonstrated Understanding       OTHER EDUCATION -Group or individual verbal, written, or video instructions that support the educational goals of the pulmonary rehab program. [place] from 08/07/2020 in [place]  Date 07/03/20  Educator DF  [Met levels]  Instruction Review Code 2- Demonstrated Understanding  [handout]       Holiday Eating Survival Tips:  -Group instruction provided by PowerPoint slides, verbal discussion, and written materials to support subject matter. The instructor gives patients tips, tricks, and techniques to help them not only survive but enjoy the holidays despite the onslaught of food that accompanies the holidays.   Knowledge Questionnaire Score:  Knowledge Questionnaire Score - 06/02/20 1134       Knowledge Questionnaire Score   Pre Score 17/18             Core Components/Risk Factors/Patient Goals at Admission:  Personal Goals and Risk Factors at Admission - 06/02/20 1145       Core Components/Risk Factors/Patient Goals on  Admission    Weight Management Obesity    Admit Weight 197 lb (89.4 kg)    Goal Weight: Long Term 185 lb (83.9 kg)    Improve shortness of breath with ADL's Yes    Intervention Provide education, individualized exercise plan and daily activity instruction to help decrease symptoms of SOB with activities of daily living.    Expected Outcomes Short Term: Improve cardiorespiratory fitness to achieve a reduction of symptoms when performing ADLs;Long Term: Be able to perform more ADLs without symptoms or delay the onset of symptoms             Core Components/Risk Factors/Patient Goals Review:   Goals and Risk Factor Review     Row Name 06/02/20 1146 06/10/20 0920 07/07/20 1225 08/11/20 1325       Core Components/Risk Factors/Patient Goals Review   Personal Goals Review Weight Management/Obesity;Develop more efficient breathing techniques such as purse lipped breathing and diaphragmatic breathing and practicing self-pacing with activity.;Increase knowledge of respiratory medications and ability to use respiratory devices properly.;Improve shortness of breath with ADL's Develop more efficient breathing techniques such as purse lipped breathing and diaphragmatic breathing and practicing self-pacing with activity.;Increase knowledge of respiratory medications and ability to use respiratory devices properly.;Improve shortness of breath with ADL's Develop more efficient breathing techniques such as purse lipped breathing and diaphragmatic breathing and practicing self-pacing with activity.;Increase knowledge of respiratory medications and ability to use respiratory devices properly.;Improve shortness of breath with ADL's Develop more efficient breathing  techniques such as purse lipped breathing and diaphragmatic breathing and practicing self-pacing with activity.;Increase knowledge of respiratory medications and ability to use respiratory devices properly.;Improve shortness of breath with ADL's    Review  -- Has not started program yet, will begin 06/12/2020. Is exercising @ 1.9 mets on the nustep, has lost 1 kg and is trying to lose more, but it has been difficult.  She is using 8-10 liters/min of supplemental oxygen. Is incresing her strength and stamina and walking more in anticipation of follwing guidelines for lung transplant @ [facility]    Expected Outcomes -- See admission goals. See admission goals. See admission goals.             Core Components/Risk Factors/Patient Goals at Discharge (Final Review):   Goals and Risk Factor Review - 08/11/20 1325       Core Components/Risk Factors/Patient Goals Review   Personal Goals Review Develop more efficient breathing techniques such as purse lipped breathing and diaphragmatic breathing and practicing self-pacing with activity.;Increase knowledge of respiratory medications and ability to use respiratory devices properly.;Improve shortness of breath with ADL's    Review Is incresing her strength and stamina and walking more in anticipation of follwing guidelines for lung transplant @ [facility]    Expected Outcomes See admission goals.             ITP Comments:   Comments: ITP REVIEW Pt is making expected progress toward pulmonary rehab goals after completing 12 sessions. Recommend continued exercise, life style modification, education, and utilization of breathing techniques to increase stamina and strength and decrease shortness of breath with exertion.

## 2020-08-12 NOTE — Progress Notes (Signed)
Daily Session Note  Patient Details  Name: TOSHI ISHII MRN: [id number] Date of Birth: 1970/06/18 Referring Provider:   April Manson Pulmonary Rehab Walk Test from 06/02/2020 in [place]  Referring Provider Dr. [name]       Encounter Date: 08/12/2020  Check In:  Session Check In - 08/12/20 1127       Check-In   Supervising physician immediately available to respond to emergencies [facility] immediately available    Physician(s) Dr. Arbutus Ped    Location [facility]    Staff Present Rosebud Poles, RN, BSN;Meredith Rosana Hoes RD, LDN;Jazline Cumbee Ysidro Evert, RN;Jessica Hassell Done, MS, ACSM-CEP, Exercise Physiologist;Carlette Wilber Oliphant, RN, BSN;Ramon Dredge, RN, MHA    Virtual Visit No    Medication changes reported     No    Fall or balance concerns reported    No    Tobacco Cessation No Change    Warm-up and Cool-down Performed as group-led instruction    Resistance Training Performed Yes    VAD Patient? No    PAD/SET Patient? No      Pain Assessment   Currently in Pain? No/denies    Multiple Pain Sites No             Capillary Blood Glucose: No results found for this or any previous visit (from the past 24 hour(s)).    Social History   Tobacco Use  Smoking Status Never  Smokeless Tobacco Never    Goals Met:  Exercise tolerated well No report of cardiac concerns or symptoms Strength training completed today  Goals Unmet:  Not Applicable  Comments: Service time is from 1015 to 1145    Dr. Fransico Him is Medical Director for Cardiac Rehab at [facility].

## 2020-08-13 ENCOUNTER — Other Ambulatory Visit: Payer: Self-pay | Admitting: Adult Health

## 2020-08-13 ENCOUNTER — Other Ambulatory Visit: Payer: Self-pay | Admitting: Internal Medicine

## 2020-08-13 ENCOUNTER — Other Ambulatory Visit (HOSPITAL_COMMUNITY): Payer: Self-pay

## 2020-08-14 ENCOUNTER — Encounter (HOSPITAL_COMMUNITY): Payer: Self-pay

## 2020-08-14 ENCOUNTER — Encounter (HOSPITAL_COMMUNITY): Payer: BC Managed Care – PPO

## 2020-08-18 ENCOUNTER — Telehealth: Payer: Self-pay | Admitting: Internal Medicine

## 2020-08-18 ENCOUNTER — Other Ambulatory Visit: Payer: Self-pay | Admitting: Gastroenterology

## 2020-08-18 DIAGNOSIS — J849 Interstitial pulmonary disease, unspecified: Secondary | ICD-10-CM

## 2020-08-18 DIAGNOSIS — Z01818 Encounter for other preprocedural examination: Secondary | ICD-10-CM

## 2020-08-18 DIAGNOSIS — J9611 Chronic respiratory failure with hypoxia: Secondary | ICD-10-CM

## 2020-08-18 NOTE — Telephone Encounter (Signed)
Called and spoke with patient. She stated that she does need the endoscopy as well. Advised her that I will go ahead and place the referral today. She verbalized understanding.   Nothing further needed at time of call.

## 2020-08-18 NOTE — Telephone Encounter (Signed)
Patient needs urgen colonoscopy for lung transplant clearance. SHe is on 8L o2. I d/w Dr Carol Ada [facility] . H ewill try to scope her this week. Please also ask patient if [facility] needs upper endoscopy. Please do referral

## 2020-08-19 ENCOUNTER — Encounter (HOSPITAL_COMMUNITY): Payer: BC Managed Care – PPO

## 2020-08-19 ENCOUNTER — Other Ambulatory Visit: Payer: Self-pay

## 2020-08-19 ENCOUNTER — Encounter (HOSPITAL_COMMUNITY): Payer: Self-pay | Admitting: Gastroenterology

## 2020-08-19 ENCOUNTER — Encounter (HOSPITAL_COMMUNITY): Payer: Self-pay

## 2020-08-19 NOTE — Anesthesia Preprocedure Evaluation (Addendum)
Anesthesia Evaluation  Patient identified by MRN, date of birth, ID band Patient awake    Reviewed: Allergy & Precautions, NPO status , Patient's Chart, lab work & pertinent test results  History of Anesthesia Complications Negative for: history of anesthetic complications  Airway Mallampati: II  TM Distance: >3 FB Neck ROM: Full    Dental no notable dental hx. (+) Dental Advisory Given   Pulmonary  Pulmonary Fibrosis on home O2   Pulmonary exam normal        Cardiovascular hypertension, Pt. on medications Normal cardiovascular exam     Neuro/Psych negative neurological ROS     GI/Hepatic negative GI ROS, Neg liver ROS,   Endo/Other  diabetesHypothyroidism   Renal/GU negative Renal ROS     Musculoskeletal negative musculoskeletal ROS (+)   Abdominal   Peds  Hematology negative hematology ROS (+)   Anesthesia Other Findings   Reproductive/Obstetrics                            Anesthesia Physical Anesthesia Plan  ASA: 3  Anesthesia Plan: MAC   Post-op Pain Management:    Induction:   PONV Risk Score and Plan: 2 and Ondansetron and Propofol infusion  Airway Management Planned: Natural Airway, Nasal Cannula and Simple Face Mask  Additional Equipment:   Intra-op Plan:   Post-operative Plan:   Informed Consent: I have reviewed the patients History and Physical, chart, labs and discussed the procedure including the risks, benefits and alternatives for the proposed anesthesia with the patient or authorized representative who has indicated his/her understanding and acceptance.     Dental advisory given  Plan Discussed with: Anesthesiologist and CRNA  Anesthesia Plan Comments:        Anesthesia Quick Evaluation

## 2020-08-20 ENCOUNTER — Ambulatory Visit (HOSPITAL_COMMUNITY): Payer: BC Managed Care – PPO | Admitting: Anesthesiology

## 2020-08-20 ENCOUNTER — Other Ambulatory Visit: Payer: Self-pay

## 2020-08-20 ENCOUNTER — Encounter (HOSPITAL_COMMUNITY): Payer: Self-pay | Admitting: Gastroenterology

## 2020-08-20 ENCOUNTER — Ambulatory Visit (HOSPITAL_COMMUNITY)
Admission: RE | Admit: 2020-08-20 | Discharge: 2020-08-20 | Disposition: A | Discharge status: Home / Self Care | Payer: BC Managed Care – PPO | Attending: Gastroenterology | Admitting: Gastroenterology

## 2020-08-20 ENCOUNTER — Encounter (HOSPITAL_COMMUNITY)
Admission: RE | Disposition: A | Discharge status: Home / Self Care | Payer: Self-pay | Source: Home / Self Care | Attending: Gastroenterology

## 2020-08-20 DIAGNOSIS — Z881 Allergy status to other antibiotic agents status: Secondary | ICD-10-CM | POA: Diagnosis not present

## 2020-08-20 DIAGNOSIS — Z9981 Dependence on supplemental oxygen: Secondary | ICD-10-CM | POA: Diagnosis not present

## 2020-08-20 DIAGNOSIS — E119 Type 2 diabetes mellitus without complications: Secondary | ICD-10-CM | POA: Diagnosis not present

## 2020-08-20 DIAGNOSIS — Z91048 Other nonmedicinal substance allergy status: Secondary | ICD-10-CM | POA: Insufficient documentation

## 2020-08-20 DIAGNOSIS — Z1211 Encounter for screening for malignant neoplasm of colon: Secondary | ICD-10-CM | POA: Diagnosis not present

## 2020-08-20 DIAGNOSIS — I10 Essential (primary) hypertension: Secondary | ICD-10-CM | POA: Insufficient documentation

## 2020-08-20 DIAGNOSIS — R12 Heartburn: Secondary | ICD-10-CM | POA: Insufficient documentation

## 2020-08-20 DIAGNOSIS — Z7682 Awaiting organ transplant status: Secondary | ICD-10-CM | POA: Insufficient documentation

## 2020-08-20 DIAGNOSIS — E079 Disorder of thyroid, unspecified: Secondary | ICD-10-CM | POA: Diagnosis not present

## 2020-08-20 DIAGNOSIS — J8410 Pulmonary fibrosis, unspecified: Secondary | ICD-10-CM | POA: Insufficient documentation

## 2020-08-20 DIAGNOSIS — Z8249 Family history of ischemic heart disease and other diseases of the circulatory system: Secondary | ICD-10-CM | POA: Insufficient documentation

## 2020-08-20 HISTORY — PX: COLONOSCOPY WITH PROPOFOL: SHX5780

## 2020-08-20 HISTORY — PX: ESOPHAGOGASTRODUODENOSCOPY (EGD) WITH PROPOFOL: SHX5813

## 2020-08-20 SURGERY — ESOPHAGOGASTRODUODENOSCOPY (EGD) WITH PROPOFOL
Anesthesia: Monitor Anesthesia Care

## 2020-08-20 MED ORDER — LACTATED RINGERS IV SOLN: INTRAVENOUS | Status: DC

## 2020-08-20 MED ORDER — LIDOCAINE HCL (CARDIAC) PF 100 MG/5ML IV SOSY
PREFILLED_SYRINGE | INTRAVENOUS | Status: DC | PRN
  Administered 2020-08-20: 100 mg via INTRAVENOUS

## 2020-08-20 MED ORDER — PROPOFOL 10 MG/ML IV BOLUS
INTRAVENOUS | Status: DC | PRN
  Administered 2020-08-20: 40 mg via INTRAVENOUS
  Administered 2020-08-20 (×4): 20 mg via INTRAVENOUS

## 2020-08-20 MED ORDER — PROPOFOL 500 MG/50ML IV EMUL
INTRAVENOUS | Status: AC
  Filled 2020-08-20: qty 50

## 2020-08-20 MED ORDER — PROPOFOL 500 MG/50ML IV EMUL
INTRAVENOUS | Status: DC | PRN
  Administered 2020-08-20: 100 ug/kg/min via INTRAVENOUS

## 2020-08-20 MED ORDER — SODIUM CHLORIDE 0.9 % IV SOLN: INTRAVENOUS | Status: DC

## 2020-08-20 SURGICAL SUPPLY — 25 items

## 2020-08-20 NOTE — Op Note (Signed)
[facility] Patient Name: Tasha Ballard Procedure Date: 08/20/2020 MRN: [id number] Attending MD: Carol Ada , MD Date of Birth: 1970/10/06 CSN: [id number] Age: 50 Admit Type: Outpatient Procedure:                Colonoscopy Indications:              Screening for colorectal malignant neoplasm Providers:                Carol Ada, MD, Kary Kos RN, RN, Terrall Laity, Elspeth Cho Tech., Technician Referring MD:              Medicines:                Propofol per Anesthesia Complications:            No immediate complications. Estimated Blood Loss:     Estimated blood loss: none. Procedure:                Pre-Anesthesia Assessment:                           - Prior to the procedure, a History and Physical                            was performed, and patient medications and                            allergies were reviewed. The patient's tolerance of                            previous anesthesia was also reviewed. The risks                            and benefits of the procedure and the sedation                            options and risks were discussed with the patient.                            All questions were answered, and informed consent                            was obtained. Prior Anticoagulants: The patient has                            taken no previous anticoagulant or antiplatelet                            agents. ASA Grade Assessment: III - A patient with                            severe systemic disease. After reviewing the risks  and benefits, the patient was deemed in                            satisfactory condition to undergo the procedure.                           - Sedation was administered by an anesthesia                            professional. Deep sedation was attained.                           After obtaining informed consent, the colonoscope                             was passed under direct vision. Throughout the                            procedure, the patient's blood pressure, pulse, and                            oxygen saturations were monitored continuously. The                            PCF-H190DL ([id number]) Olympus pediatric colonscope                            was introduced through the anus and advanced to the                            the terminal ileum. The colonoscopy was performed                            without difficulty. The patient tolerated the                            procedure well. The quality of the bowel                            preparation was excellent. The ileocecal valve,                            appendiceal orifice, and rectum were photographed. Scope In: 7:36:29 AM Scope Out: 7:49:14 AM Scope Withdrawal Time: 0 hours 9 minutes 36 seconds  Total Procedure Duration: 0 hours 12 minutes 45 seconds  Findings:      The entire examined colon appeared normal. Impression:               - The entire examined colon is normal.                           - No specimens collected. Moderate Sedation:      Not Applicable - Patient had care per Anesthesia. Recommendation:           - Patient has a contact number available for  emergencies. The signs and symptoms of potential                            delayed complications were discussed with the                            patient. Return to normal activities tomorrow.                            Written discharge instructions were provided to the                            patient.                           - Resume regular diet.                           - Continue present medications.                           - Repeat colonoscopy in 10 years for screening                            purposes. Procedure Code(s):        --- Professional ---                           [phone], Colonoscopy, flexible; diagnostic, including                             collection of specimen(s) by brushing or washing,                            when performed (separate procedure) Diagnosis Code(s):        --- Professional ---                           Z12.11, Encounter for screening for malignant                            neoplasm of colon CPT copyright 2019 American [employer] Association. All rights reserved. The codes documented in this report are preliminary and upon coder review may  be revised to meet current compliance requirements. Carol Ada, MD Carol Ada, MD 08/20/2020 7:56:42 AM This report has been signed electronically. Number of Addenda: 0

## 2020-08-20 NOTE — Op Note (Signed)
[facility] Patient Name: Tasha Ballard Procedure Date: 08/20/2020 MRN: [id number] Attending MD: Carol Ada , MD Date of Birth: 1971/02/09 CSN: [id number] Age: 50 Admit Type: Outpatient Procedure:                Upper GI endoscopy Indications:              Heartburn Providers:                Carol Ada, MD, Kary Kos RN, RN, Terrall Laity, Elspeth Cho Tech., Technician Referring MD:              Medicines:                Propofol per Anesthesia Complications:            No immediate complications. Estimated Blood Loss:     Estimated blood loss: none. Procedure:                Pre-Anesthesia Assessment:                           - Prior to the procedure, a History and Physical                            was performed, and patient medications and                            allergies were reviewed. The patient's tolerance of                            previous anesthesia was also reviewed. The risks                            and benefits of the procedure and the sedation                            options and risks were discussed with the patient.                            All questions were answered, and informed consent                            was obtained. Prior Anticoagulants: The patient has                            taken no previous anticoagulant or antiplatelet                            agents. ASA Grade Assessment: III - A patient with                            severe systemic disease. After reviewing the risks  and benefits, the patient was deemed in                            satisfactory condition to undergo the procedure.                           - Sedation was administered by an anesthesia                            professional. Deep sedation was attained.                           After obtaining informed consent, the endoscope was                            passed under direct  vision. Throughout the                            procedure, the patient's blood pressure, pulse, and                            oxygen saturations were monitored continuously. The                            PCF-H190DL ([id number]) Olympus pediatric colonscope                            was introduced through the mouth, and advanced to                            the second part of duodenum. The upper GI endoscopy                            was accomplished without difficulty. The patient                            tolerated the procedure well. Scope In: Scope Out: Findings:      The esophagus was normal.      The stomach was normal.      The examined duodenum was normal. Impression:               - Normal esophagus.                           - Normal stomach.                           - Normal examined duodenum.                           - No specimens collected. Moderate Sedation:      Not Applicable - Patient had care per Anesthesia. Recommendation:           - Patient has a contact number available for  emergencies. The signs and symptoms of potential                            delayed complications were discussed with the                            patient. Return to normal activities tomorrow.                            Written discharge instructions were provided to the                            patient.                           - Resume previous diet.                           - Continue present medications. Procedure Code(s):        --- Professional ---                           [phone], Esophagogastroduodenoscopy, flexible,                            transoral; diagnostic, including collection of                            specimen(s) by brushing or washing, when performed                            (separate procedure) Diagnosis Code(s):        --- Professional ---                           R12, Heartburn CPT copyright 2019 American Medical  Association. All rights reserved. The codes documented in this report are preliminary and upon coder review may  be revised to meet current compliance requirements. Carol Ada, MD Carol Ada, MD 08/20/2020 7:58:02 AM This report has been signed electronically. Number of Addenda: 0

## 2020-08-20 NOTE — Anesthesia Postprocedure Evaluation (Signed)
Anesthesia Post Note  Patient: Tasha Ballard  Procedure(s) Performed: ESOPHAGOGASTRODUODENOSCOPY (EGD) WITH PROPOFOL COLONOSCOPY WITH PROPOFOL     Patient location during evaluation: Endoscopy Anesthesia Type: MAC Level of consciousness: awake and alert Pain management: pain level controlled Vital Signs Assessment: post-procedure vital signs reviewed and stable Respiratory status: spontaneous breathing and respiratory function stable Cardiovascular status: stable Postop Assessment: no apparent nausea or vomiting Anesthetic complications: no   No notable events documented.  Last Vitals:  Vitals:   08/20/20 0800 08/20/20 0810  BP: (!) 90/58 123/89  Pulse: 74 74  Resp: 17 18  Temp:    SpO2: 100% 100%    Last Pain:  Vitals:   08/20/20 0810  TempSrc:   PainSc: 0-No pain                 Masyn Rostro DANIEL

## 2020-08-20 NOTE — H&P (Signed)
Tasha Ballard HPI: Mrs. Tasha Ballard is here today for a screening colonoscopy and EGD for GERD symptoms.  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  Outside of some GERD symptoms she does not have any other GI issues.  Past Medical History:  Diagnosis Date   Aneurysm ([place])    Back pain    Cancer ([place])    Diabetes mellitus without complication ([place])    is on metformin due to prednisone denies diabetes   H/O blood clots    Hypertension    Joint pain    Pulmonary fibrosis ([place])    Pulmonary hypertension ([place]) 06/2020   Mild noted on cath   SOB (shortness of breath)    Swelling    Thyroid disease    Vitamin D deficiency     Past Surgical History:  Procedure Laterality Date   ABLATION     IR IMAGING GUIDED PORT INSERTION  01/26/2018   IR REMOVAL TUN ACCESS W/ PORT W/O FL MOD SED  02/19/2020   KNEE SURGERY     LUNG BIOPSY     RIGHT/LEFT HEART CATH AND CORONARY ANGIOGRAPHY N/A 06/25/2020   Procedure: RIGHT/LEFT HEART CATH AND CORONARY ANGIOGRAPHY;  Surgeon: Larey Dresser, MD;  Location: [facility];  Service: Cardiovascular;  Laterality: N/A;    Family History  Problem Relation Age of Onset   Heart failure Mother    Heart disease Mother    Rheumatologic disease Father     Social History:  reports that she has never smoked. She has never used smokeless tobacco. She reports current alcohol use. She reports that she does not use drugs.  Allergies:  Allergies  Allergen Reactions   Macrobid [Nitrofurantoin Macrocrystal] Nausea And Vomiting   Adhesive [Tape] Rash    Medications: Scheduled: Continuous:  sodium chloride     lactated ringers 10 mL/hr at 08/20/20 0710    No results found for this or any previous visit (from the past 24 hour(s)).   No results found.  ROS:  As stated above in the HPI otherwise negative.  Blood pressure (!) 161/96, pulse 85, temperature 97.9 F (36.6 C), temperature source Oral, resp. rate  13, height _0  (1.575 m), weight 82.6 kg, SpO2 100 %.    PE: Gen: NAD, Alert and Oriented HEENT:  [place]/AT, EOMI Neck: Supple, no LAD Lungs: CTA Bilaterally CV: RRR without M/G/R ABD: Soft, NTND, +BS Ext: No C/C/E  Assessment/Plan: 1) Screening colonoscopy. 2) EGD for GERD.  Tasha Ballard D 08/20/2020, 7:23 AM

## 2020-08-20 NOTE — Discharge Instructions (Addendum)
YOU HAD AN ENDOSCOPIC PROCEDURE TODAY: Refer to the procedure report and other information in the discharge instructions given to you for any specific questions about what was found during the examination. If this information does not answer your questions, please call [facility] at [phone] to clarify.   YOU SHOULD EXPECT: Some feelings of bloating in the abdomen. Passage of more gas than usual. Walking can help get rid of the air that was put into your GI tract during the procedure and reduce the bloating. If you had a lower endoscopy (such as a colonoscopy or flexible sigmoidoscopy) you may notice spotting of blood in your stool or on the toilet paper. Some abdominal soreness may be present for a day or two, also.  DIET: Your first meal following the procedure should be a light meal and then it is ok to progress to your normal diet. A half-sandwich or bowl of soup is an example of a good first meal. Heavy or fried foods are harder to digest and may make you feel nauseous or bloated. Drink plenty of fluids but you should avoid alcoholic beverages for 24 hours. If you had an esophageal dilation, please see attached information for diet.   ACTIVITY: Your care partner should take you home directly after the procedure. You should plan to take it easy, moving slowly for the rest of the day. You can resume normal activity the day after the procedure however YOU SHOULD NOT DRIVE, use power tools, machinery or perform tasks that involve climbing or major physical exertion for 24 hours (because of the sedation medicines used during the test).   SYMPTOMS TO REPORT IMMEDIATELY: A gastroenterologist can be reached at any hour. Please call [phone]  for any of the following symptoms:  Following lower endoscopy (colonoscopy, flexible sigmoidoscopy) Excessive amounts of blood in the stool  Significant tenderness, worsening of abdominal pains  Swelling of the abdomen that is new, acute  Fever of  100 or higher  Following upper endoscopy (EGD, EUS, ERCP, esophageal dilation) Vomiting of blood or coffee ground material  New, significant abdominal pain  New, significant chest pain or pain under the shoulder blades  Painful or persistently difficult swallowing  New shortness of breath  Black, tarry-looking or red, bloody stools  FOLLOW UP:  If any biopsies were taken you will be contacted by phone or by letter within the next 1-3 weeks. Call [phone]  if you have not heard about the biopsies in 3 weeks.  Please also call with any specific questions about appointments or follow up tests.

## 2020-08-20 NOTE — Transfer of Care (Signed)
Immediate Anesthesia Transfer of Care Note  Patient: Tasha Ballard  Procedure(s) Performed: ESOPHAGOGASTRODUODENOSCOPY (EGD) WITH PROPOFOL COLONOSCOPY WITH PROPOFOL  Patient Location: Endoscopy Unit  Anesthesia Type:MAC  Level of Consciousness: awake, alert , oriented and patient cooperative  Airway & Oxygen Therapy: Patient Spontanous Breathing and Patient connected to face mask oxygen  Post-op Assessment: Report given to RN and Post -op Vital signs reviewed and stable  Post vital signs: Reviewed and stable  Last Vitals:  Vitals Value Taken Time  BP 136/77 0754  Temp    Pulse 76   Resp    SpO2 100%     Last Pain:  Vitals:   08/20/20 0651  TempSrc: Oral  PainSc: 0-No pain         Complications: No notable events documented.

## 2020-08-21 ENCOUNTER — Encounter (HOSPITAL_COMMUNITY): Payer: BC Managed Care – PPO

## 2020-08-21 ENCOUNTER — Encounter (HOSPITAL_COMMUNITY): Payer: Self-pay

## 2020-08-21 ENCOUNTER — Encounter (HOSPITAL_COMMUNITY): Payer: Self-pay | Admitting: Gastroenterology

## 2020-08-21 ENCOUNTER — Other Ambulatory Visit (HOSPITAL_COMMUNITY): Payer: Self-pay

## 2020-08-21 ENCOUNTER — Encounter (HOSPITAL_COMMUNITY)
Admission: RE | Admit: 2020-08-21 | Discharge: 2020-08-21 | Disposition: A | Discharge status: Home / Self Care | Payer: BC Managed Care – PPO | Source: Ambulatory Visit | Attending: Internal Medicine | Admitting: Internal Medicine

## 2020-08-21 DIAGNOSIS — J849 Interstitial pulmonary disease, unspecified: Secondary | ICD-10-CM | POA: Diagnosis not present

## 2020-08-21 NOTE — Progress Notes (Signed)
Daily Session Note  Patient Details  Name: Tasha Ballard MRN: [id number] Date of Birth: 11-Jun-1970 Referring Provider:   April Manson Pulmonary Rehab Walk Test from 06/02/2020 in [place]  Referring Provider Dr. [name]       Encounter Date: 08/21/2020  Check In:  Session Check In - 08/21/20 1141       Check-In   Supervising physician immediately available to respond to emergencies [facility] immediately available    Physician(s) Dr. Alfredia Ferguson    Location [facility]    Staff Present Rosebud Poles, RN, BSN;Lisa Ysidro Evert, Cathleen Fears, MS, ACSM-CEP, Exercise Physiologist;Annedrea Rosezella [place], RN, [facility]    Virtual Visit No    Medication changes reported     No    Fall or balance concerns reported    No    Tobacco Cessation No Change    Warm-up and Cool-down Performed as group-led instruction    Resistance Training Performed Yes    VAD Patient? No    PAD/SET Patient? No      Pain Assessment   Currently in Pain? No/denies    Multiple Pain Sites No             Capillary Blood Glucose: No results found for this or any previous visit (from the past 24 hour(s)).    Social History   Tobacco Use  Smoking Status Never  Smokeless Tobacco Never    Goals Met:  Proper associated with RPD/PD & O2 Sat Exercise tolerated well No report of cardiac concerns or symptoms Strength training completed today  Goals Unmet:  Not Applicable  Comments: Service time is from 1015 to 1140    Dr. Fransico Him is Medical Director for Cardiac Rehab at [facility].

## 2020-08-26 ENCOUNTER — Other Ambulatory Visit: Payer: Self-pay

## 2020-08-26 ENCOUNTER — Other Ambulatory Visit (HOSPITAL_COMMUNITY): Payer: Self-pay

## 2020-08-26 ENCOUNTER — Encounter (HOSPITAL_COMMUNITY): Payer: Self-pay

## 2020-08-26 ENCOUNTER — Encounter (HOSPITAL_COMMUNITY)
Admission: RE | Admit: 2020-08-26 | Discharge: 2020-08-26 | Disposition: A | Discharge status: Home / Self Care | Payer: Self-pay | Source: Ambulatory Visit | Attending: Internal Medicine | Admitting: Internal Medicine

## 2020-08-26 DIAGNOSIS — J849 Interstitial pulmonary disease, unspecified: Secondary | ICD-10-CM | POA: Insufficient documentation

## 2020-08-26 NOTE — Progress Notes (Signed)
Daily Session Note  Patient Details  Name: Tasha Ballard MRN: [id number] Date of Birth: [birth year] Referring Provider:   April Manson Pulmonary Rehab Walk Test from 06/02/2020 in [place]  Referring Provider Dr. [name]       Encounter Date: 08/26/2020  Check In:  Session Check In - 08/26/20 1120       Check-In   Supervising physician immediately available to respond to emergencies [facility] immediately available    Physician(s) Dr. Alfredia Ferguson    [facility]    Staff Present Rosebud Poles, RN, Milus Glazier, MS, ACSM-CEP, CCRP, Exercise Physiologist;Kasen Sako Jani [place]vel, MS, ACSM-CEP, Exercise Physiologist    Virtual Visit No    Medication changes reported     No    Fall or balance concerns reported    No    Tobacco Cessation No Change    Warm-up and Cool-down Performed as group-led instruction    Resistance Training Performed Yes    VAD Patient? No    PAD/SET Patient? No      Pain Assessment   Currently in Pain? No/denies    Multiple Pain Sites No             Capillary Blood Glucose: No results found for this or any previous visit (from the past 24 hour(s)).    Social History   Tobacco Use  Smoking Status Never  Smokeless Tobacco Never    Goals Met:  Exercise tolerated well No report of cardiac concerns or symptoms Strength training completed today  Goals Unmet:  Not Applicable.   Comments Service time is from 1018 to 1132    Dr. Fransico Him is Medical Director for Cardiac Rehab at [facility].

## 2020-08-28 ENCOUNTER — Telehealth: Payer: Self-pay | Admitting: Internal Medicine

## 2020-08-28 ENCOUNTER — Encounter (HOSPITAL_COMMUNITY)
Admission: RE | Admit: 2020-08-28 | Discharge: 2020-08-28 | Disposition: A | Discharge status: Home / Self Care | Payer: Self-pay | Source: Ambulatory Visit | Attending: Internal Medicine | Admitting: Internal Medicine

## 2020-08-28 ENCOUNTER — Encounter (HOSPITAL_COMMUNITY): Payer: Self-pay

## 2020-08-28 ENCOUNTER — Telehealth (HOSPITAL_COMMUNITY): Payer: Self-pay | Admitting: *Deleted

## 2020-08-28 ENCOUNTER — Other Ambulatory Visit: Payer: Self-pay

## 2020-08-28 DIAGNOSIS — J849 Interstitial pulmonary disease, unspecified: Secondary | ICD-10-CM

## 2020-08-28 DIAGNOSIS — J9611 Chronic respiratory failure with hypoxia: Secondary | ICD-10-CM

## 2020-08-28 NOTE — Telephone Encounter (Signed)
Spoke with pulmonary rehab and they need an order for maintenance pulmonary rehab. Have pended the order. Dr. [name] may we place this order?

## 2020-08-28 NOTE — Progress Notes (Signed)
Daily Session Note  Patient Details  Name: Tasha Ballard MRN: [id number] Date of Birth: [birth year] Referring Provider:   April Manson Pulmonary Rehab Walk Test from 06/02/2020 in [place]  Referring Provider Dr. [name]       Encounter Date: 08/28/2020  Check In:  Session Check In - 08/28/20 1214       Check-In   Supervising physician immediately available to respond to emergencies [facility] immediately available    Physician(s) Dr. Cruzita Lederer    [facility]    Staff Present Rosebud Poles, RN, BSN;Carlette Wilber Oliphant, RN, Isaac Laud, MS, ACSM-CEP, Exercise Physiologist;Annedrea Rosezella [place], RN, MHA;Lisa Ysidro Evert, RN    Virtual Visit No    Medication changes reported     No    Fall or balance concerns reported    No    Tobacco Cessation No Change    Warm-up and Cool-down Performed as group-led instruction    Resistance Training Performed Yes    VAD Patient? No    PAD/SET Patient? No      Pain Assessment   Currently in Pain? No/denies    Pain Score 0-No pain    Multiple Pain Sites No             Capillary Blood Glucose: No results found for this or any previous visit (from the past 24 hour(s)).    Social History   Tobacco Use  Smoking Status Never  Smokeless Tobacco Never    Goals Met:  Proper associated with RPD/PD & O2 Sat Exercise tolerated well No report of cardiac concerns or symptoms Strength training completed today Completed pulmonary rehab undergrad program  Goals Unmet:  Not Applicable  Comments: Service time is from 1015 to 1130 Patient completed pulmonary undergrad rehab program today and made improvements with post 6 minute walk test. Pt plans to return to maintenance program soon.     Dr. Fransico Him is Medical Director for Cardiac Rehab at [facility].

## 2020-09-02 ENCOUNTER — Encounter (HOSPITAL_COMMUNITY): Payer: Self-pay

## 2020-09-04 ENCOUNTER — Encounter (HOSPITAL_COMMUNITY): Payer: Self-pay

## 2020-09-09 ENCOUNTER — Other Ambulatory Visit: Payer: Self-pay

## 2020-09-09 ENCOUNTER — Encounter (HOSPITAL_COMMUNITY): Payer: Self-pay

## 2020-09-09 ENCOUNTER — Encounter (HOSPITAL_COMMUNITY)
Admission: RE | Admit: 2020-09-09 | Discharge: 2020-09-09 | Disposition: A | Discharge status: Home / Self Care | Payer: Self-pay | Source: Ambulatory Visit | Attending: Internal Medicine | Admitting: Internal Medicine

## 2020-09-09 ENCOUNTER — Encounter: Payer: BC Managed Care – PPO | Admitting: *Deleted

## 2020-09-09 DIAGNOSIS — J849 Interstitial pulmonary disease, unspecified: Secondary | ICD-10-CM

## 2020-09-09 DIAGNOSIS — Z006 Encounter for examination for normal comparison and control in clinical research program: Secondary | ICD-10-CM

## 2020-09-09 NOTE — Progress Notes (Signed)
Tasha Ballard presented today for their first day of exercise at Pulmonary maintenance.  Her weight was 81.9 KG  Entrance Vitals Blood Pressure: 136/90 Heart Rate: 99 Oxygen Saturation: 99 6L  Exercising Vitals Blood Pressure: 124/84 Heart Rate: 96 Oxygen Saturation: 100 8 L  Exit Vitals Blood Pressure: 150/90 Heart Rate: 96 Oxygen Saturation: 99 8L   Fall Risk: high  GOALS MET Exercise tolerated well No report of cardiac concerns or symptoms Strength training completed today  GOALS UNMET Not Applicable   Additional Comments: [name] tolerated her first day of PR maintenance well without c/o. VS stable.

## 2020-09-09 NOTE — Research (Signed)
Title: Chronic Fibrosing Interstitial Lung Disease with Progressive Phenotype Prospective Outcomes (ILD-PRO) Registry   Protocol #: IPF-PRO-SUB, Clinical Trials # [id number], Sponsor: [employer]  Protocol Version Amendment 4 dated 12Sep2019  and confirmed current on 09/09/2020 Consent Version for today's visit date of 09/09/2020  is Version [employer] [facility] Approved Version 5   Objectives:  Describe current approaches to diagnosis and treatment of chronic fibrosing ILDs with progressive phenotype  Describe the natural history of chronic fibrosing ILDs with progressive phenotype  Assess quality of life from self-administered participant reported questionnaires for each disease group  Describe participant interactions with the healthcare system, describe treatment practices across multiple institutions for each disease group  Collect biological samples linked to well characterized chronic fibrosing ILDs with progressive phenotype to identify disease biomarkers  Collect data and biological samples that will support future research studies.                                            Key Inclusion Criteria: Willing and able to provide informed consent  Age ? 30 years  Diagnosis of a non-IPF ILD of any duration, including, but not limited to Idiopathic Non-Specific Interstitial Pneumonia (INSIP), Unclassifiable Idiopathic Interstitial Pneumonias (IIPs), Interstitial Pneumonia with Autoimmune Features (IPAF), Autoimmune ILDs such as Rheumatoid Arthritis (RA-ILD) and Systemic Sclerosis (SSC-ILD), Chronic Hypersensitivity Pneumonitis (HP), Sarcoidosis or Exposure-related ILDs such as asbestosis.  Chronic fibrosing ILD defined by reticular abnormality with traction bronchiectasis with or without honeycombing confirmed by chest HRCT scan and/or lung biopsy.  Progressive phenotype as defined by fulfilling at least one of the criteria below of fibrotic changes (progression set point)  within the last 24 months regardless of treatment considered appropriate in individual ILDs:  decline in FVC % predicted (% pred) based on >10% relative decline  decline in FVC % pred based on ? 5 - <10% relative decline in FVC combined with worsening of respiratory symptoms as assessed by the site investigator  decline in FVC % pred based on ? 5 - <10% relative decline in FVC combined with increasing extent of fibrotic changes on chest imaging (HRCT scan) as assessed by the site investigator  decline in DLCO % pred based on ? 10% relative decline  worsening of respiratory symptoms as well as increasing extent of fibrotic changes on chest imaging (HRCT scan) as assessed by the site investigator independent of FVC change.    Key Exclusion Criteria: Malignancy, treated or untreated, other than skin or early stage prostate cancer, within the past 5 years  Currently listed for lung transplantation at the time of enrollment  Currently enrolled in a clinical trial at the time of enrollment in this registry     [facility] [place] Coordinator note :   This visit for Subject Tasha Ballard with DOB: [birth year] on 09/09/2020 for the above protocol is Visit/Encounter #5 and is for purpose of research.   Subject expressed continued interest and consent in continuing as a study subject. Subject confirmed that there was no change in contact information (e.g. address, telephone, email). Subject thanked for participation in research and contribution to science.    The Subject was informed that the PI Dr. [name] continues to have oversight of the subject's visits and course  through relevant discussions, reviews and also specifically of this visit by routing of this note to the [place].  During this  visit on 09/09/20, the subject completed the blood work and questionnaires per the above referenced protocol. Please refer to the subject's paper source binder for further details.    Signed  by  Tasha Ballard Clinical Research Coordinator  [facility]  [place], [place] 3:30 PM 09/09/2020

## 2020-09-11 ENCOUNTER — Encounter (HOSPITAL_COMMUNITY): Payer: Self-pay

## 2020-09-11 ENCOUNTER — Encounter (HOSPITAL_COMMUNITY)
Admission: RE | Admit: 2020-09-11 | Discharge: 2020-09-11 | Disposition: A | Discharge status: Home / Self Care | Payer: Self-pay | Source: Ambulatory Visit | Attending: Internal Medicine | Admitting: Internal Medicine

## 2020-09-11 ENCOUNTER — Other Ambulatory Visit: Payer: Self-pay

## 2020-09-16 ENCOUNTER — Other Ambulatory Visit (HOSPITAL_COMMUNITY): Payer: Self-pay

## 2020-09-16 ENCOUNTER — Encounter (HOSPITAL_COMMUNITY): Payer: Self-pay

## 2020-09-16 ENCOUNTER — Encounter (HOSPITAL_COMMUNITY)
Admission: RE | Admit: 2020-09-16 | Discharge: 2020-09-16 | Disposition: A | Discharge status: Home / Self Care | Payer: Self-pay | Source: Ambulatory Visit | Attending: Internal Medicine | Admitting: Internal Medicine

## 2020-09-16 ENCOUNTER — Other Ambulatory Visit: Payer: Self-pay

## 2020-09-18 ENCOUNTER — Encounter (HOSPITAL_COMMUNITY): Payer: Self-pay

## 2020-09-18 ENCOUNTER — Other Ambulatory Visit (HOSPITAL_COMMUNITY): Payer: Self-pay

## 2020-09-18 NOTE — Addendum Note (Signed)
Encounter addended by: George Ina, RD on: 09/18/2020 7:13 AM  Actions taken: Flowsheet data copied forward, Flowsheet accepted

## 2020-09-19 ENCOUNTER — Other Ambulatory Visit (HOSPITAL_COMMUNITY): Payer: Self-pay

## 2020-09-22 ENCOUNTER — Other Ambulatory Visit (HOSPITAL_COMMUNITY): Payer: Self-pay

## 2020-09-22 NOTE — Progress Notes (Signed)
Discharge Progress Report  Patient Details  Name: Tasha Ballard MRN: [id number] Date of Birth: 17-Jul-1970 Referring Provider:   April Manson [facility] Walk Test from 06/02/2020 in [place]  Referring Provider Dr. [name]        Number of Visits: 16  Reason for Discharge:  Patient reached a stable level of exercise. Patient independent in their exercise. Patient has met program and personal goals.  Smoking History:  Social History   Tobacco Use  Smoking Status Never  Smokeless Tobacco Never    Diagnosis:  Interstitial lung disease ([place])  ADL UCSD:  Pulmonary Assessment Scores     Row Name 06/02/20 1132 06/02/20 1221 08/28/20 1541     ADL UCSD   ADL Phase Entry -- Exit   SOB Score total 77 -- --     CAT Score   CAT Score 24 -- --     mMRC Score   mMRC Score -- 3 3    Row Name 09/09/20 1534         ADL [facility]SD   ADL Phase Exit     SOB Score total 68           CAT Score     CAT Score 21             Initial Exercise Prescription:  Initial Exercise Prescription - 06/02/20 1300       Date of Initial Exercise RX and Referring Provider   Date 06/02/20    Referring Provider Dr. [name]    Expected Discharge Date 08/07/20      Oxygen   Oxygen Continuous    Liters 10-15      Treadmill   MPH 1.6    Grade 0    Minutes 15      NuStep   Level 2    SPM 80    Minutes 15      Prescription Details   Frequency (times per week) 2    Duration Progress to 30 minutes of continuous aerobic without signs/symptoms of physical distress      Intensity   THRR 40-80% of Max Heartrate 68-135    Ratings of Perceived Exertion 11-13    Perceived Dyspnea 0-4      Progression   Progression Continue to progress workloads to maintain intensity without signs/symptoms of physical distress.      Resistance Training   Training Prescription Yes    Weight Red bands    Reps 10-15             Discharge Exercise  Prescription (Final Exercise Prescription Changes):  Exercise Prescription Changes - 08/12/20 1157       Response to Exercise   Blood Pressure (Admit) 122/84    Blood Pressure (Exit) 116/82    Heart Rate (Admit) 93 bpm    Heart Rate (Exercise) 121 bpm    Heart Rate (Exit) 99 bpm    Oxygen Saturation (Admit) 99 %    Oxygen Saturation (Exercise) 93 %    Oxygen Saturation (Exit) 99 %    Rating of Perceived Exertion (Exercise) 12    Perceived Dyspnea (Exercise) 2    Duration Progress to 30 minutes of  aerobic without signs/symptoms of physical distress    Intensity THRR unchanged      Progression   Progression Continue to progress workloads to maintain intensity without signs/symptoms of physical distress.      Resistance Training   Training Prescription Yes  Weight Red bands    Reps 10-15    Time 10 Minutes      NuStep   Level 5    SPM 80    Minutes 15    METs 1.8      Track   Laps 8    Minutes 15    METs 1.93             Functional Capacity:  6 Minute Walk     Row Name 06/02/20 1221 08/28/20 1537       6 Minute Walk   Phase Initial Discharge    Distance 1025 feet 1290 feet    Distance % Change -- 25.85 %    Distance Feet Change -- 265 ft    Walk Time 6 minutes 6 minutes    # of Rest Breaks 0 0    MPH 1.94 2.44    METS 3.65 4.46    RPE 11 12.5    Perceived Dyspnea  1.5 2.5    VO2 Peak 12.79 15.62    Symptoms No No    Resting HR 97 bpm 99 bpm    Resting BP 120/88 116/82    Resting Oxygen Saturation  99 % 98 %    Exercise Oxygen Saturation  during 6 min walk 87 % 86 %    Max Ex. HR 134 bpm 162 bpm    Max Ex. BP 170/98 166/92    2 Minute Post BP 154/90 130/80         Interval HR      1 Minute HR 118 128    2 Minute HR 124 139    3 Minute HR 134 142    4 Minute HR 127 154    5 Minute HR 130 157    6 Minute HR 134 162    2 Minute Post HR 111 105    Interval Heart Rate? Yes Yes         Interval Oxygen      Interval Oxygen? Yes --     Baseline Oxygen Saturation % 99 % 98 %    1 Minute Oxygen Saturation % 96 % 9 %    1 Minute Liters of Oxygen 10 L 10 L    2 Minute Oxygen Saturation % 91 % 91 %    2 Minute Liters of Oxygen 10 L 10 L    3 Minute Oxygen Saturation % 87 % 88 %    3 Minute Liters of Oxygen 10 L  Increased to 15L 10 L    4 Minute Oxygen Saturation % 93 % 88 %    4 Minute Liters of Oxygen 15 L 10 L    5 Minute Oxygen Saturation % 93 % 86 %    5 Minute Liters of Oxygen 15 L 10 L  Increased to 15L    6 Minute Oxygen Saturation % 93 % 86 %    6 Minute Liters of Oxygen 15 L 15 L    2 Minute Post Oxygen Saturation % 99 % 99 %    2 Minute Post Liters of Oxygen 10 L 15 L            Psychological, QOL, Others - Outcomes: PHQ 2/9: Depression screen [facility] 2/9 08/28/2020 06/02/2020  Decreased Interest 0 0  Down, Depressed, Hopeless 0 0  PHQ - 2 Score 0 0  Altered sleeping 0 0  Tired, decreased energy 0 2  Change in appetite 3 1  Feeling  bad or failure about yourself  0 0  Trouble concentrating 0 0  Moving slowly or fidgety/restless 0 0  Suicidal thoughts 0 0  PHQ-9 Score 3 3  Difficult doing work/chores Not difficult at all Not difficult at all  Some recent data might be hidden    Quality of Life:   Personal Goals: Goals established at orientation with interventions provided to work toward goal.  Personal Goals and Risk Factors at Admission - 06/02/20 1145       Core Components/Risk Factors/Patient Goals on Admission    Weight Management Obesity    Admit Weight 197 lb (89.4 kg)    Goal Weight: Long Term 185 lb (83.9 kg)    Improve shortness of breath with ADL's Yes    Intervention Provide education, individualized exercise plan and daily activity instruction to help decrease symptoms of SOB with activities of daily living.    Expected Outcomes Short Term: Improve cardiorespiratory fitness to achieve a reduction of symptoms when performing ADLs;Long Term: Be able to perform more ADLs without symptoms  or delay the onset of symptoms              Personal Goals Discharge:  Goals and Risk Factor Review     Row Name 06/02/20 1146 06/10/20 0920 07/07/20 1225 08/11/20 1325       Core Components/Risk Factors/Patient Goals Review   Personal Goals Review Weight Management/Obesity;Develop more efficient breathing techniques such as purse lipped breathing and diaphragmatic breathing and practicing self-pacing with activity.;Increase knowledge of respiratory medications and ability to use respiratory devices properly.;Improve shortness of breath with ADL's Develop more efficient breathing techniques such as purse lipped breathing and diaphragmatic breathing and practicing self-pacing with activity.;Increase knowledge of respiratory medications and ability to use respiratory devices properly.;Improve shortness of breath with ADL's Develop more efficient breathing techniques such as purse lipped breathing and diaphragmatic breathing and practicing self-pacing with activity.;Increase knowledge of respiratory medications and ability to use respiratory devices properly.;Improve shortness of breath with ADL's Develop more efficient breathing techniques such as purse lipped breathing and diaphragmatic breathing and practicing self-pacing with activity.;Increase knowledge of respiratory medications and ability to use respiratory devices properly.;Improve shortness of breath with ADL's    Review -- Has not started program yet, will begin 06/12/2020. Is exercising @ 1.9 mets on the nustep, has lost 1 kg and is trying to lose more, but it has been difficult.  She is using 8-10 liters/min of supplemental oxygen. Is incresing her strength and stamina and walking more in anticipation of follwing guidelines for lung transplant @ [facility]    Expected Outcomes -- See admission goals. See admission goals. See admission goals.             Exercise Goals and Review:  Exercise Goals     Row Name 06/02/20 1218              Exercise Goals   Increase Physical Activity Yes       Intervention Provide advice, education, support and counseling about physical activity/exercise needs.;Develop an individualized exercise prescription for aerobic and resistive training based on initial evaluation findings, risk stratification, comorbidities and participant's personal goals.       Expected Outcomes Short Term: Attend rehab on a regular basis to increase amount of physical activity.;Long Term: Add in home exercise to make exercise part of routine and to increase amount of physical activity.;Long Term: Exercising regularly at least 3-5 days a week.       Increase Strength  and Stamina Yes       Intervention Provide advice, education, support and counseling about physical activity/exercise needs.;Develop an individualized exercise prescription for aerobic and resistive training based on initial evaluation findings, risk stratification, comorbidities and participant's personal goals.       Expected Outcomes Short Term: Increase workloads from initial exercise prescription for resistance, speed, and METs.;Short Term: Perform resistance training exercises routinely during rehab and add in resistance training at home;Long Term: Improve cardiorespiratory fitness, muscular endurance and strength as measured by increased METs and functional capacity (6MWT)       Able to understand and use rate of perceived exertion (RPE) scale Yes       Intervention Provide education and explanation on how to use RPE scale       Expected Outcomes Short Term: Able to use RPE daily in rehab to express subjective intensity level;Long Term:  Able to use RPE to guide intensity level when exercising independently       Able to understand and use Dyspnea scale Yes       Intervention Provide education and explanation on how to use Dyspnea scale       Expected Outcomes Short Term: Able to use Dyspnea scale daily in rehab to express subjective sense of  shortness of breath during exertion;Long Term: Able to use Dyspnea scale to guide intensity level when exercising independently       Knowledge and understanding of Target Heart Rate Range (THRR) Yes       Intervention Provide education and explanation of THRR including how the numbers were predicted and where they are located for reference       Expected Outcomes Short Term: Able to state/look up THRR;Long Term: Able to use THRR to govern intensity when exercising independently;Short Term: Able to use daily as guideline for intensity in rehab       Understanding of Exercise Prescription Yes       Intervention Provide education, explanation, and written materials on patient's individual exercise prescription       Expected Outcomes Short Term: Able to explain program exercise prescription;Long Term: Able to explain home exercise prescription to exercise independently                Exercise Goals Re-Evaluation:  Exercise Goals Re-Evaluation     Row Name 06/13/20 1451 07/10/20 0903 08/11/20 0737         Exercise Goal Re-Evaluation   Exercise Goals Review Increase Physical Activity;Increase Strength and Stamina;Able to understand and use rate of perceived exertion (RPE) scale;Able to understand and use Dyspnea scale;Knowledge and understanding of Target Heart Rate Range (THRR);Understanding of Exercise Prescription Increase Physical Activity;Increase Strength and Stamina;Able to understand and use rate of perceived exertion (RPE) scale;Able to understand and use Dyspnea scale;Knowledge and understanding of Target Heart Rate Range (THRR);Understanding of Exercise Prescription Increase Physical Activity;Increase Strength and Stamina;Able to understand and use rate of perceived exertion (RPE) scale;Able to understand and use Dyspnea scale;Knowledge and understanding of Target Heart Rate Range (THRR);Understanding of Exercise Prescription     Comments Leroy Kennedy has completed 1 exercise session so far,  which is too early to note any progression. She tolerated her first day of exercie well. We had originally thought she would need 15L of oxygen while walking on the treadmill due to desaturation on walk test, but she only required 10L on the treadmill on her first day of exercise. We will continue to monitor oxygen requirements with exertion. She is exercising at 1.5 METS  on the Nustep and 2.2 METS on the treadmill. Will continue to monitor and progress as she is able. Leroy Kennedy has completed 6 exercise sessions and has already made progression with workload and MET level increases. She is exercising at 1.9 METS on the Nustep and 2.2 METS on the treadmill. Will continue to monitor and progress as she is able. Leroy Kennedy has completed 12 exercise sessions and has been consistent with workload and MET level increases. She has had a set back recently due to being sick, but she is recovering and getting back to her normal workloads. She is a transplant candidat so we have had to change her exercise prescription to meet those needs. She now needs to walk on the track for at least 20 minutes and can do the Nustep for 10-15 minutes. She has a Nustep at home so she is able to do that outside of rehab, but she is required to walk at least 20 minutes 3 days a week and it has to be on level ground, cannot be on a treadmill. Pt is currently exercising at 1.7 METS on the Nustep. Will continue to monitor and progress as she is able.     Expected Outcomes Through exercise at rehab and at home, the patient will decrease shortness of breath with daily activities and feel confident in carrying out an exercise regime at home.  Through exercise at rehab and at home, the patient will decrease shortness of breath with daily activities and feel confident in carrying out an exercise regime at home.  Through exercise at rehab and at home, the patient will decrease shortness of breath with daily activities and feel confident in carrying out an  exercise regime at home.               Nutrition & Weight - Outcomes:  Pre Biometrics - 06/02/20 1115       Pre Biometrics   Height 5' 2" (1.575 m)    Weight 89.4 kg    BMI (Calculated) 36.04    Grip Strength 19 kg             Post Biometrics - 08/28/20 1213        Post  Biometrics   Grip Strength 21 kg             Nutrition:  Nutrition Therapy & Goals - 06/17/20 1215       Nutrition Therapy   Diet TLC      Personal Nutrition Goals   Nutrition Goal Pt to identify food quantities necessary to achieve weight loss of 6-24 lb at graduation from cardiac rehab.      Intervention Plan   Intervention Prescribe, educate and counsel regarding individualized specific dietary modifications aiming towards targeted core components such as weight, hypertension, lipid management, diabetes, heart failure and other comorbidities.;Nutrition handout(s) given to patient.    Expected Outcomes Short Term Goal: A plan has been developed with personal nutrition goals set during dietitian appointment.;Long Term Goal: Adherence to prescribed nutrition plan.             Nutrition Discharge:   Education Questionnaire Score:  Knowledge Questionnaire Score - 09/09/20 1526       Knowledge Questionnaire Score   Post Score 18/18             Goals reviewed with patient; copy given to patient.

## 2020-09-22 NOTE — Addendum Note (Signed)
Encounter addended by: Lance Morin, RN on: 09/22/2020 1:53 PM  Actions taken: Clinical Note Signed, Episode resolved

## 2020-09-23 ENCOUNTER — Encounter (HOSPITAL_COMMUNITY): Payer: Self-pay

## 2020-09-23 DIAGNOSIS — J849 Interstitial pulmonary disease, unspecified: Secondary | ICD-10-CM | POA: Insufficient documentation

## 2020-09-25 ENCOUNTER — Encounter (HOSPITAL_COMMUNITY): Payer: Self-pay

## 2020-09-26 ENCOUNTER — Telehealth: Payer: Self-pay | Admitting: Adult Health

## 2020-09-26 DIAGNOSIS — J9612 Chronic respiratory failure with hypercapnia: Secondary | ICD-10-CM

## 2020-09-26 NOTE — Telephone Encounter (Signed)
ordered

## 2020-09-26 NOTE — Telephone Encounter (Signed)
Called and spoke with Patient.  [name], NP recommendations given.  Understanding stated.  Patient stated she is on 4-6 liter oxygen at rest.  ABG order placed.  Nothing further at this time.

## 2020-09-26 NOTE — Telephone Encounter (Signed)
Patient undergoing Transplant evaluation at [place] continues to rise , rec for nocturnal BIPAP per pt.   Previous sleep study per pt neg x 3 ,   Records show sleep study in 2019 neg for OSA   Chronic Respiratory Failure with Hypercarbia  Please set up for ABG on O2 -see if can be done in next couple of days

## 2020-09-30 ENCOUNTER — Encounter (HOSPITAL_COMMUNITY)
Admission: RE | Admit: 2020-09-30 | Discharge: 2020-09-30 | Disposition: A | Discharge status: Home / Self Care | Payer: Self-pay | Source: Ambulatory Visit | Attending: Internal Medicine | Admitting: Internal Medicine

## 2020-09-30 ENCOUNTER — Other Ambulatory Visit: Payer: Self-pay

## 2020-10-01 ENCOUNTER — Ambulatory Visit (HOSPITAL_COMMUNITY)
Admission: RE | Admit: 2020-10-01 | Discharge: 2020-10-01 | Disposition: A | Discharge status: Home / Self Care | Payer: BC Managed Care – PPO | Source: Ambulatory Visit | Attending: Adult Health | Admitting: Adult Health

## 2020-10-01 DIAGNOSIS — J9612 Chronic respiratory failure with hypercapnia: Secondary | ICD-10-CM | POA: Insufficient documentation

## 2020-10-01 LAB — BLOOD GAS, ARTERIAL
Acid-Base Excess: 6.2 mmol/L — ABNORMAL HIGH (ref 0.0–2.0)
Bicarbonate: 30.6 mmol/L — ABNORMAL HIGH (ref 20.0–28.0)
FIO2: 44
O2 Saturation: 99.5 %
Patient temperature: 37
pCO2 arterial: 47.4 mmHg (ref 32.0–48.0)
pH, Arterial: 7.426 (ref 7.350–7.450)
pO2, Arterial: 244 mmHg — ABNORMAL HIGH (ref 83.0–108.0)

## 2020-10-01 NOTE — Progress Notes (Signed)
Patient in today for ABG on 6 LPM [place].  Done and results on computer.

## 2020-10-02 ENCOUNTER — Encounter (HOSPITAL_COMMUNITY)
Admission: RE | Admit: 2020-10-02 | Discharge: 2020-10-02 | Disposition: A | Discharge status: Home / Self Care | Payer: Self-pay | Source: Ambulatory Visit | Attending: Internal Medicine | Admitting: Internal Medicine

## 2020-10-02 ENCOUNTER — Other Ambulatory Visit: Payer: Self-pay

## 2020-10-07 ENCOUNTER — Encounter (HOSPITAL_COMMUNITY): Payer: Self-pay

## 2020-10-09 ENCOUNTER — Encounter (HOSPITAL_COMMUNITY): Payer: Self-pay

## 2020-10-13 ENCOUNTER — Telehealth (INDEPENDENT_AMBULATORY_CARE_PROVIDER_SITE_OTHER): Payer: BC Managed Care – PPO | Admitting: Adult Health

## 2020-10-13 DIAGNOSIS — N390 Urinary tract infection, site not specified: Secondary | ICD-10-CM

## 2020-10-13 DIAGNOSIS — J849 Interstitial pulmonary disease, unspecified: Secondary | ICD-10-CM | POA: Diagnosis not present

## 2020-10-13 NOTE — Patient Instructions (Addendum)
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  Continue on oxygen-goal is to keep oxygen levels greater than 88 to 90% Continue with Pulmonary rehab  Work on healthy weight loss.  Follow-up in 2-3 months with Dr. [name] in ILD clinic.  Please contact office for sooner follow up if symptoms do not improve or worsen or seek emergency care

## 2020-10-13 NOTE — Progress Notes (Signed)
Virtual Visit via Video Note  I connected with Tasha Ballard on 10/13/20 at  2:00 PM EDT by a video enabled telemedicine application and verified that I am speaking with the correct person using two identifiers.  Location: Patient: Home  Provider: Office    I discussed the limitations of evaluation and management by telemedicine and the availability of in person appointments. The patient expressed understanding and agreed to proceed.  History of Present Illness: 50 year-old female never smoker followed for very complicated medical history with interstitial lung disease-progressive NSIP and lung cancer diagnosed in 2021 Pulmonary consult 2019 to establish for interstitial lung disease.  Previously followed at [facility].  Underwent surgical lung biopsy 2013 that showed fibrotic NSIP.  Initially treated with CellCept.  Around 2016 had significant decline and was started on oxygen.  She has been evaluated at [facility] with Threasa Alpha in September 2019 felt to have progressive NSIP.  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  This led to progressive respiratory decline and increased symptom burden.  She has been treated with 1 full year of oral and IV Cytoxan from December 2019 to November 2020.  Started on Ofev in December 2019.  Started on Imuran January 2021.  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  With exercise does require higher oxygen over 10 L.  She is on chronic steroids with baseline prednisone at 10 mg daily. New diagnosis of lung cancer 2021 with a 1 cm left lower lobe nodule showing growth at May 2021 at 1.5 cm.  PET scan showed marked hypermetabolic activity with SUV at 7.7 there was no evidence of distant disease.  She was felt to high risk for biopsy.   And was for to radiation oncology and underwent SBRT with completion August 24, 2019. Port-A-Cath removed December 2021  Today's video visit is an acute office visit.  Patient is currently being evaluated at [facility] for lung transplant.  She is in the final stages.  Last week she was there all week.  This week she will be having her official presentation to the transplant team.  She will meet with infectious disease this Thursday.  She will also meet with insurance department as well.  She said last week went well but it was very stressful.  She complains that she developed a cold sore over the last 3 days.  She did set out in the sun for just an hour to.  Shortly the next day she started noticing that she was developing a cold sore on her lower lip.  She is using Abreva.  And was called in [place] by her dentist.   Patient's had a history of some recurrent UTIs in the past.  Recently over the last 3 days noticed some increased urination and pressure.  Occurred after sex.  She has no hematuria no back pain no fever no abdominal pain no nausea vomiting.  Overall breathing has been stable.  She has increased exercise on her pulmonary rehab. She has ongoing fatigue and intermittent low energy but is fighting through this.  She is very excited about being placed on the transplant list.  Past Medical History:  Diagnosis Date   Aneurysm ([place])    Back pain    Cancer ([place])    Diabetes  mellitus without complication ([place])    is on metformin due to prednisone denies diabetes   H/O blood clots    Hypertension    Joint pain    Pulmonary fibrosis ([place])    Pulmonary hypertension ([place]) 06/2020   Mild noted on cath   SOB (shortness of breath)    Swelling    Thyroid disease    Vitamin D deficiency    Current Outpatient Medications on File Prior to Visit  Medication Sig Dispense Refill   azaTHIOprine (IMURAN) 50 MG tablet TAKE 2 TABLETS (100MG) BY MOUTH EVERY DAY (Patient taking differently: Take 100  mg by mouth daily.) 180 tablet 1   benzonatate (TESSALON) 200 MG capsule Take 1 capsule (200 mg total) by mouth 3 (three) times daily as needed for cough. 30 capsule 1   carvedilol (COREG) 6.25 MG tablet Take 6.25 mg by mouth 2 (two) times daily with a meal.     famciclovir (FAMVIR) 500 MG tablet Take by mouth.     famotidine (PEPCID) 40 MG tablet TAKE 1 TABLET BY MOUTH EVERY DAY (Patient taking differently: Take 40 mg by mouth daily.) 90 tablet 3   folic acid (FOLVITE) 1 MG tablet TAKE 1 TABLET BY MOUTH EVERY DAY (Patient taking differently: Take 1 mg by mouth daily.) 30 tablet 5   furosemide (LASIX) 40 MG tablet Take 40 mg by mouth daily. may repeat dose if needed for fluid retention.     gabapentin (NEURONTIN) 100 MG capsule Take 100 mg by mouth 2 (two) times daily as needed.     metFORMIN (GLUCOPHAGE) 500 MG tablet Take 500 mg by mouth 2 (two) times daily.     montelukast (SINGULAIR) 10 MG tablet TAKE 1 TABLET BY MOUTH EVERY DAY AFTER SUPPER (Patient taking differently: Take 10 mg by mouth daily in the afternoon.) 90 tablet 1   OFEV 150 MG CAPS TAKE 1 CAPSULE TWICE A DAY (12 HOURS APART) WITH FOOD (Patient taking differently: Take 150 mg by mouth every 12 (twelve) hours.) 60 capsule 11   ondansetron (ZOFRAN) 8 MG tablet TAKE 1 TABLET BY MOUTH EVERY 8 HOURS AS NEEDED FOR NAUSEA AND VOMITING (Patient taking differently: Take 8 mg by mouth every 8 (eight) hours as needed for nausea or vomiting.) 45 tablet 2   Pirfenidone (ESBRIET) 801 MG TABS TAKE 1 TABLET BY MOUTH 3 TIMES DAILY WITH MEALS (Patient taking differently: Take 801 mg by mouth 3 (three) times daily.) 90 tablet 11   potassium chloride (KLOR-CON) 10 MEQ tablet TAKE 3 TABLETS BY MOUTH DAILY (Patient taking differently: Take 10 mEq by mouth 3 (three) times daily.) 90 tablet 2   predniSONE (DELTASONE) 10 MG tablet Take 1 tablet (10 mg total) by mouth daily.     sodium chloride (OCEAN) 0.65 % SOLN nasal spray Place 1 spray into both nostrils  daily as needed for congestion.     spironolactone (ALDACTONE) 100 MG tablet Take 100 mg by mouth daily.     sulfamethoxazole-trimethoprim (BACTRIM) 400-80 MG tablet Take 1 tablet by mouth every Monday, Wednesday, and Friday.     thyroid (ARMOUR) 90 MG tablet Take 90 mg by mouth daily before breakfast.      Vitamin D, Ergocalciferol, (DRISDOL) 1.25 MG (50000 UT) CAPS capsule TAKE 1 CAPSULE BY MOUTH ONCE WEEKLY (Patient taking differently: Take 50,000 Units by mouth every 7 (seven) days.) 3 capsule 0   No current facility-administered medications on file prior to visit.    Observations/Objective: Appears in no apparent distress.  On Oxygen . Speaks in full sentences   High-resolution CT chest April 27, 2019-stable UIP changes no progression since September 2020.  New 1 cm left lower lobe nodule   High-resolution CT chest Jul 19, 2019-interval growth of the left lower lobe nodule 1.5 cm.  UIP changes stable   CT chest October 02, 2019 stable UIP pattern.  Slight interval decrease in the left lung base nodule.  Consistent with treatment response.   January 24, 2020 CT chest underlying changes of fibrotic lung disease, relatively stable groundglass attenuation in left base.  Previous left medial lung nodule measuring 1.2 cm no longer discretely visible.  Possibly secondary to increased consolidative opacity along the left lung base.  Unchanged left upper lobe 7 mm nodule.  Nodular opacity in the medial left base.   PFT January 25, 2020 FEV1 40%, ratio 70, FVC 35%, DLCO 27% PFT November 20, 2018 FEV1 40%, ratio 91, FVC 35%, DLCO 37% PFT March 30, 2018 FEV1 38%, ratio 93%, FVC 32%, DLCO 34%  Assessment and Plan: Dysuria questionable etiology.  Patient is to leave a urine sample. Continue with fluids.  Cold sore-continue with Abreva and Famvir  Progressive NSIP-appears stable.  Continue with transplant work-up at [facility] dependent respiratory failure continue on oxygen maintain  O2 saturation greater than 88 to 9%.3  Plan  Patient Instructions  Use Abreva for cold sore as needed Finish Famvir as recommended Please leave a urine sample at our office Continue on Ofev 150 mg twice daily Continue on prednisone 10 mg daily Continue Imuran  Esbriet as directed.  Continue on oxygen-goal is to keep oxygen levels greater than 88 to 90% Continue with Pulmonary rehab  Work on healthy weight loss.  Follow-up in 2-3 months with Dr. [name] in ILD clinic.  Please contact office for sooner follow up if symptoms do not improve or worsen or seek emergency care      Follow Up Instructions:    I discussed the assessment and treatment plan with the patient. The patient was provided an opportunity to ask questions and all were answered. The patient agreed with the plan and demonstrated an understanding of the instructions.   The patient was advised to call back or seek an in-person evaluation if the symptoms worsen or if the condition fails to improve as anticipated.  I provided 25  minutes of non-face-to-face time during this encounter.   Rexene Edison, NP

## 2020-10-14 ENCOUNTER — Other Ambulatory Visit (HOSPITAL_COMMUNITY): Payer: Self-pay

## 2020-10-14 ENCOUNTER — Encounter (HOSPITAL_COMMUNITY)
Admission: RE | Admit: 2020-10-14 | Discharge: 2020-10-14 | Disposition: A | Discharge status: Home / Self Care | Payer: BC Managed Care – PPO | Source: Ambulatory Visit | Attending: Internal Medicine | Admitting: Internal Medicine

## 2020-10-14 ENCOUNTER — Other Ambulatory Visit (INDEPENDENT_AMBULATORY_CARE_PROVIDER_SITE_OTHER): Payer: BC Managed Care – PPO

## 2020-10-14 ENCOUNTER — Other Ambulatory Visit: Payer: Self-pay

## 2020-10-14 DIAGNOSIS — N390 Urinary tract infection, site not specified: Secondary | ICD-10-CM

## 2020-10-14 LAB — URINALYSIS, ROUTINE W REFLEX MICROSCOPIC
Bilirubin Urine: NEGATIVE
Hgb urine dipstick: NEGATIVE
Leukocytes,Ua: NEGATIVE
Nitrite: NEGATIVE
RBC / HPF: NONE SEEN (ref 0–?)
Specific Gravity, Urine: 1.02 (ref 1.000–1.030)
Total Protein, Urine: NEGATIVE
Urine Glucose: NEGATIVE
Urobilinogen, UA: 0.2 (ref 0.0–1.0)
pH: 6 (ref 5.0–8.0)

## 2020-10-15 LAB — URINE CULTURE
MICRO NUMBER:: 12279280
SPECIMEN QUALITY:: ADEQUATE

## 2020-10-16 ENCOUNTER — Other Ambulatory Visit (HOSPITAL_COMMUNITY): Payer: Self-pay

## 2020-10-16 ENCOUNTER — Encounter (HOSPITAL_COMMUNITY): Payer: Self-pay

## 2020-10-17 ENCOUNTER — Encounter: Payer: Self-pay | Admitting: *Deleted

## 2020-10-20 ENCOUNTER — Telehealth: Payer: Self-pay | Admitting: Adult Health

## 2020-10-20 ENCOUNTER — Other Ambulatory Visit: Payer: Self-pay | Admitting: Internal Medicine

## 2020-10-20 ENCOUNTER — Other Ambulatory Visit (HOSPITAL_COMMUNITY): Payer: Self-pay

## 2020-10-20 MED ORDER — SULFAMETHOXAZOLE-TRIMETHOPRIM 400-80 MG PO TABS: 1.0000 | ORAL_TABLET | ORAL | 3 refills | Status: DC

## 2020-10-20 NOTE — Telephone Encounter (Signed)
Requests refill of Bactrim , sent

## 2020-10-21 ENCOUNTER — Encounter (HOSPITAL_COMMUNITY)
Admission: RE | Admit: 2020-10-21 | Discharge: 2020-10-21 | Disposition: A | Discharge status: Home / Self Care | Payer: Self-pay | Source: Ambulatory Visit | Attending: Internal Medicine | Admitting: Internal Medicine

## 2020-10-21 ENCOUNTER — Other Ambulatory Visit: Payer: Self-pay

## 2020-10-21 ENCOUNTER — Telehealth: Payer: Self-pay | Admitting: Adult Health

## 2020-10-21 NOTE — Telephone Encounter (Signed)
Patient scheduled with [name], NP at 10am 11/18/20 for ILD follow up, per Lynelle Smoke, NP request.

## 2020-10-21 NOTE — Telephone Encounter (Signed)
Can you add this patient to my schedule on 9/27 at 10 am  F.up for ILD

## 2020-10-23 ENCOUNTER — Encounter (HOSPITAL_COMMUNITY): Payer: Self-pay

## 2020-10-23 DIAGNOSIS — J849 Interstitial pulmonary disease, unspecified: Secondary | ICD-10-CM | POA: Insufficient documentation

## 2020-10-24 ENCOUNTER — Other Ambulatory Visit (HOSPITAL_COMMUNITY): Payer: Self-pay

## 2020-10-28 ENCOUNTER — Other Ambulatory Visit: Payer: Self-pay | Admitting: Internal Medicine

## 2020-10-28 ENCOUNTER — Encounter (HOSPITAL_COMMUNITY): Payer: Self-pay

## 2020-10-30 ENCOUNTER — Encounter (HOSPITAL_COMMUNITY): Payer: Self-pay

## 2020-11-04 ENCOUNTER — Encounter (HOSPITAL_COMMUNITY): Payer: Self-pay

## 2020-11-06 ENCOUNTER — Encounter (HOSPITAL_COMMUNITY): Payer: Self-pay

## 2020-11-06 ENCOUNTER — Telehealth: Payer: Self-pay | Admitting: Adult Health

## 2020-11-06 DIAGNOSIS — J849 Interstitial pulmonary disease, unspecified: Secondary | ICD-10-CM

## 2020-11-06 MED ORDER — BENZONATATE 200 MG PO CAPS
200.0000 mg | ORAL_CAPSULE | Freq: Three times a day (TID) | ORAL | 3 refills | Status: AC | PRN
Start: 2020-11-06 — End: ?

## 2020-11-06 NOTE — Telephone Encounter (Signed)
Needs tessalon sent to new pharm

## 2020-11-11 ENCOUNTER — Other Ambulatory Visit: Payer: Self-pay | Admitting: Adult Health

## 2020-11-11 ENCOUNTER — Encounter: Payer: Self-pay | Admitting: Adult Health

## 2020-11-11 ENCOUNTER — Other Ambulatory Visit: Payer: Self-pay

## 2020-11-11 ENCOUNTER — Ambulatory Visit (INDEPENDENT_AMBULATORY_CARE_PROVIDER_SITE_OTHER): Payer: BC Managed Care – PPO | Admitting: Adult Health

## 2020-11-11 ENCOUNTER — Encounter (HOSPITAL_COMMUNITY)
Admission: RE | Admit: 2020-11-11 | Discharge: 2020-11-11 | Disposition: A | Discharge status: Home / Self Care | Payer: Self-pay | Source: Ambulatory Visit | Attending: Internal Medicine | Admitting: Internal Medicine

## 2020-11-11 VITALS — BP 122/78 | HR 99 | Temp 97.8°F | Ht 62.0 in | Wt 182.4 lb

## 2020-11-11 DIAGNOSIS — J9611 Chronic respiratory failure with hypoxia: Secondary | ICD-10-CM | POA: Diagnosis not present

## 2020-11-11 DIAGNOSIS — R309 Painful micturition, unspecified: Secondary | ICD-10-CM | POA: Diagnosis not present

## 2020-11-11 DIAGNOSIS — N390 Urinary tract infection, site not specified: Secondary | ICD-10-CM

## 2020-11-11 DIAGNOSIS — J849 Interstitial pulmonary disease, unspecified: Secondary | ICD-10-CM | POA: Diagnosis not present

## 2020-11-11 DIAGNOSIS — R300 Dysuria: Secondary | ICD-10-CM

## 2020-11-11 LAB — URINALYSIS, ROUTINE W REFLEX MICROSCOPIC

## 2020-11-11 MED ORDER — AMOXICILLIN-POT CLAVULANATE 875-125 MG PO TABS: 1.0000 | ORAL_TABLET | Freq: Two times a day (BID) | ORAL | 0 refills | Status: AC

## 2020-11-11 MED ORDER — PHENAZOPYRIDINE HCL 100 MG PO TABS: 100.0000 mg | ORAL_TABLET | Freq: Three times a day (TID) | ORAL | 0 refills | Status: AC | PRN

## 2020-11-11 NOTE — Assessment & Plan Note (Signed)
Remains on oxygen 8 L.  Continue to maintain O2 saturations greater than 88 to 90%.

## 2020-11-11 NOTE — Assessment & Plan Note (Signed)
Progressive NSIP-now on the transplant list at [facility]. Continue on current aggressive maintenance regimen. Currently off of Ofev and Esbriet since she is on the transplant list  Plan  Patient Instructions  Begin Augmentin 829m Twice daily, take with food.  Increase fluids.  Continue on Prednisone 10 mg daily Continue Imuran  Continue on Bactrim.  Continue on oxygen-goal is to keep oxygen levels greater than 88 to 90% Continue with Pulmonary rehab  Work on healthy weight loss.  Follow-up in 3 months with Dr. RChase Callerin ILD clinic.  Please contact office for sooner follow up if symptoms do not improve or worsen or seek emergency care

## 2020-11-11 NOTE — Assessment & Plan Note (Signed)
Suspected urinary tract infection with dysuria, urgency, hematuria.  Patient did take a Pyridium this morning.  Urine culture and urine analysis has been sent to the lab.  We will treat with empiric Augmentin.  Patient previously had Proteus and Klebsiella.  With multidrug resistance.  Previously intermittent sensitivity to Augmentin. Advise if symptoms or not improving or worsen will need sooner follow-up.  Will refer to urology or PCP if symptoms persist.  She has a history of recurrent UTIs but has been doing better over the last several months.  Plan  Patient Instructions  Begin Augmentin 847m Twice daily, take with food.  Increase fluids.  Continue on Prednisone 10 mg daily Continue Imuran  Continue on Bactrim.  Continue on oxygen-goal is to keep oxygen levels greater than 88 to 90% Continue with Pulmonary rehab  Work on healthy weight loss.  Follow-up in 3 months with Dr. RChase Callerin ILD clinic.  Please contact office for sooner follow up if symptoms do not improve or worsen or seek emergency care

## 2020-11-11 NOTE — Patient Instructions (Addendum)
Begin Augmentin 846m Twice daily, take with food.  Increase fluids.  Continue on Prednisone 10 mg daily Continue Imuran  Continue on Bactrim.  Continue on oxygen-goal is to keep oxygen levels greater than 88 to 90% Continue with Pulmonary rehab  Work on healthy weight loss.  Follow-up in 3 months with Dr. RChase Callerin ILD clinic.  Please contact office for sooner follow up if symptoms do not improve or worsen or seek emergency care

## 2020-11-11 NOTE — Progress Notes (Signed)
_0  ID: Tasha Ballard, female    DOB: 1970-05-21, 50 y.o.   MRN: [id number]  Chief Complaint  Patient presents with   Follow-up    Referring provider: Gara Kroner, DO  HPI: 50 year old female never smoker followed for very complicated medical history with interstitial lung disease-progressive NSIP on lung cancer diagnosed in 2021.  Pulmonary consult 2019 to establish for interstitial lung disease.  Previously followed at [facility]. Underwent surgical lung biopsy 2013 that showed fibrotic NSIP initially treated with CellCept.  Around 2016 has significant decline was started on oxygen she has been evaluated at [facility] with Threasa Alpha in 2019 felt to have progressive NSIP.  She has a very complicated medical history including gastric artery aneurysm with repair in 2017 .  Post surgery had progressive decline and increased symptom burden.  She was treated with 1 full year of oral and IV Cytoxan from December 2019 to November 2020.  She was started on Ofev in December 2019.  Started on Imuran January 2021.  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 At baseline is on oxygen 6 L at rest and 8 to 10 L with ambulation.  With exercise does require over 10 L.  She is on chronic steroids with prednisone at 10 mg daily. Treated for probable lung cancer 2021 with a 1 cm left lower lobe nodule showing growth up to 1.5 cm.  PET showed marked hypermetabolic activity.  With no evidence of distant disease.  She was felt to be high risk for surgical intervention.  She underwent Chemo and SBRT completed in August 24, 2019. COVID-19 infection February 2022 received a monoclonal antibody infusion. She has been seen in [facility] for lung transplant and is currently on the transplant list beginning September 2022.  She is currently living in [place].   TEST/EVENTS :  High-resolution CT chest April 27, 2019-stable UIP changes no progression since September 2020.  New 1 cm  left lower lobe nodule   High-resolution CT chest Jul 19, 2019-interval growth of the left lower lobe nodule 1.5 cm.  UIP changes stable   CT chest October 02, 2019 stable UIP pattern.  Slight interval decrease in the left lung base nodule.  Consistent with treatment response.   January 24, 2020 CT chest underlying changes of fibrotic lung disease, relatively stable groundglass attenuation in left base.  Previous left medial lung nodule measuring 1.2 cm no longer discretely visible.  Possibly secondary to increased consolidative opacity along the left lung base.  Unchanged left upper lobe 7 mm nodule.  Nodular opacity in the medial left base.   PFT January 25, 2020 FEV1 40%, ratio 70, FVC 35%, DLCO 27% PFT November 20, 2018 FEV1 40%, ratio 91, FVC 35%, DLCO 37% PFT March 30, 2018 FEV1 38%, ratio 93%, FVC 32%, DLCO 34%  11/11/2020 Follow up: ILD, O2 RF  Patient returns for a follow-up visit.  Patient has progressive NSIP, remains on an aggressive regimen with Imuran, prednisone.  Has recently been taken off of Ofev and Esbriet as she is on the transplant list now at [facility].  She has recently moved down to [facility] in an apartment awaiting her transplant.  She continues with pulmonary rehab.  She remains on oxygen 8 l/m , 10l/m with activity . Marland Kitchen Since stopping Ofev and Esbriet , stomach feels so much better.  Got Evsuheld last week.  Covid Booster 4 weeks ago.  Breathing appears to be at baseline .  No flare of cough or wheezing . No change in activity level.  Walking outside.   She does complain of ongoing urinary symptoms.  Last month urine culture showed mixed genital flora with no isolated organism. Complains of 1 days of urinary pressure, urgency , bloody urine .  Took pyridium this am . Lower back pressure. No fever, no n/v/d.      Allergies  Allergen Reactions   Macrobid [Nitrofurantoin Macrocrystal] Nausea And Vomiting   Adhesive [Tape] Rash    Immunization History   Administered Date(s) Administered   Fluad Quad(high Dose 65+) 12/28/2019   Hep A / Hep B 10/09/2020   Influenza Split 11/12/2011, 12/24/2014, 02/05/2016, 01/07/2017, 11/22/2017   Influenza,inj,Quad PF,6+ Mos 12/24/2014, 02/05/2016, 01/07/2017, 11/22/2017, 10/20/2018   Influenza,inj,quad, With Preservative 11/22/2012, 12/24/2014, 02/05/2016, 01/07/2017   Influenza-Unspecified 12/24/2014, 02/05/2016, 01/07/2017, 11/22/2017   PFIZER Comirnaty(Gray Top)Covid-19 Tri-Sucrose Vaccine 10/09/2020   PFIZER(Purple Top)SARS-COV-2 Vaccination 04/06/2019, 04/30/2019, 10/18/2019   Pneumococcal Conjugate-13 07/20/2017   Pneumococcal Polysaccharide-23 06/09/2012, 01/03/2019   Tdap 02/19/2011, 10/09/2020    Past Medical History:  Diagnosis Date   Aneurysm ([place])    Back pain    Cancer ([place])    Diabetes mellitus without complication ([place])    is on metformin due to prednisone denies diabetes   H/O blood clots    Hypertension    Joint pain    Pulmonary fibrosis ([place])    Pulmonary hypertension ([place]) 06/2020   Mild noted on cath   SOB (shortness of breath)    Swelling    Thyroid disease    Vitamin D deficiency     Tobacco History: Social History   Tobacco Use  Smoking Status Never  Smokeless Tobacco Never   Counseling given: Not Answered   Outpatient Medications Prior to Visit  Medication Sig Dispense Refill   azaTHIOprine (IMURAN) 50 MG tablet TAKE 2 TABLETS (100MG) BY MOUTH EVERY DAY (Patient taking differently: Take 100 mg by mouth daily.) 180 tablet 1   benzonatate (TESSALON) 200 MG capsule Take 1 capsule (200 mg total) by mouth 3 (three) times daily as needed for cough. 45 capsule 3   carvedilol (COREG) 6.25 MG tablet Take 6.25 mg by mouth 2 (two) times daily with a meal.     famotidine (PEPCID) 40 MG tablet TAKE 1 TABLET BY MOUTH EVERY DAY (Patient taking differently: Take 40 mg by mouth daily.) 90 tablet 3   folic acid (FOLVITE) 1 MG tablet TAKE 1 TABLET BY MOUTH EVERY DAY (Patient  taking differently: Take 1 mg by mouth daily.) 30 tablet 5   furosemide (LASIX) 40 MG tablet Take 40 mg by mouth daily. may repeat dose if needed for fluid retention.     gabapentin (NEURONTIN) 100 MG capsule Take 100 mg by mouth 2 (two) times daily as needed.     metFORMIN (GLUCOPHAGE) 500 MG tablet Take 500 mg by mouth 2 (two) times daily.     montelukast (SINGULAIR) 10 MG tablet TAKE 1 TABLET BY MOUTH EVERY DAY AFTER SUPPER 90 tablet 1   ondansetron (ZOFRAN) 8 MG tablet TAKE 1 TABLET BY MOUTH EVERY 8 HOURS AS NEEDED FOR NAUSEA AND VOMITING (Patient taking differently: Take 8 mg by mouth every 8 (eight) hours as needed for nausea or vomiting.) 45 tablet 2   potassium chloride (KLOR-CON) 10 MEQ tablet TAKE 3 TABLETS BY MOUTH DAILY (Patient taking differently: Take 10 mEq by mouth 3 (three) times daily.) 90 tablet 2   pravastatin (PRAVACHOL) 20 MG tablet Take by mouth.  predniSONE (DELTASONE) 10 MG tablet Take 1 tablet (10 mg total) by mouth daily.     sodium chloride (OCEAN) 0.65 % SOLN nasal spray Place 1 spray into both nostrils daily as needed for congestion.     spironolactone (ALDACTONE) 100 MG tablet Take 100 mg by mouth daily.     sulfamethoxazole-trimethoprim (BACTRIM) 400-80 MG tablet Take 1 tablet by mouth every Monday, Wednesday, and Friday. 12 tablet 3   thyroid (ARMOUR) 90 MG tablet Take 90 mg by mouth daily before breakfast.      Vitamin D, Ergocalciferol, (DRISDOL) 1.25 MG (50000 UT) CAPS capsule TAKE 1 CAPSULE BY MOUTH ONCE WEEKLY (Patient taking differently: Take 50,000 Units by mouth every 7 (seven) days.) 3 capsule 0   OFEV 150 MG CAPS TAKE 1 CAPSULE TWICE A DAY (12 HOURS APART) WITH FOOD (Patient not taking: Reported on 11/11/2020) 60 capsule 11   Pirfenidone (ESBRIET) 801 MG TABS TAKE 1 TABLET BY MOUTH 3 TIMES DAILY WITH MEALS (Patient not taking: Reported on 11/11/2020) 90 tablet 11   famciclovir (FAMVIR) 500 MG tablet Take by mouth.     No facility-administered medications  prior to visit.     Review of Systems:   Constitutional:   No  weight loss, night sweats,  Fevers, chills,  +fatigue, or  lassitude.  HEENT:   No headaches,  Difficulty swallowing,  Tooth/dental problems, or  Sore throat,                No sneezing, itching, ear ache, nasal congestion, post nasal drip,   CV:  No chest pain,  Orthopnea, PND, swelling in lower extremities, anasarca, dizziness, palpitations, syncope.   GI  No heartburn, indigestion, abdominal pain, nausea, vomiting, diarrhea, change in bowel habits, loss of appetite, bloody stools.   Resp:   No chest wall deformity  Skin: no rash or lesions.  GU: +dysuria   MS:  No joint pain or swelling.  No decreased range of motion.  No back pain.    Physical Exam  BP 122/78 (BP Location: Left Arm, Patient Position: Sitting, Cuff Size: Large)   Pulse 99   Temp 97.8 F (36.6 C) (Oral)   Ht _0  (1.575 m)   Wt 182 lb 6.4 oz (82.7 kg)   SpO2 97% Comment: 8L continuous  BMI 33.36 kg/m   GEN: A/Ox3; pleasant , NAD, well nourished    HEENT:  [place]/AT,  EACs-clear, TMs-wnl, NOSE-clear, THROAT-clear, no lesions, no postnasal drip or exudate noted.   NECK:  Supple w/ fair ROM; no JVD; normal carotid impulses w/o bruits; no thyromegaly or nodules palpated; no lymphadenopathy.    RESP  BB crackles , no accessory muscle use, no dullness to percussion  CARD:  RRR, no m/r/g, no peripheral edema, pulses intact, no cyanosis or clubbing.  GI:   Soft & nt; nml bowel sounds; no organomegaly or masses detected.  Neg CVA tenderness , no guarding or rebound   [place]co: Warm bil, no deformities or joint swelling noted.   Neuro: alert, no focal deficits noted.    Skin: Warm, no lesions or rashes    Lab Results:  CBC  BNP No results found for: BNP  ProBNP    Component Value Date/Time   PROBNP 32.0 02/08/2019 1029    Imaging: No results found.    PFT Results Latest Ref Rng & Units 01/25/2020 11/20/2018 03/30/2018  FVC-Pre L  1.15 1.16 1.09  FVC-Predicted Pre % 35 35 32  Pre FEV1/FVC % % 92  91 93  FEV1-Pre L 1.06 1.06 1.01  FEV1-Predicted Pre % 40 40 38  DLCO uncorrected ml/min/mmHg 5.44 7.29 6.85  DLCO UNC% % 27 37 34  DLCO corrected ml/min/mmHg 11.78 7.45 -  DLCO COR %Predicted % 60 37 -  DLVA Predicted % 150 93 92    No results found for: NITRICOXIDE      Assessment & Plan:   ILD (interstitial lung disease) ([place]) Progressive NSIP-now on the transplant list at [facility]. Continue on current aggressive maintenance regimen. Currently off of Ofev and Esbriet since she is on the transplant list  Plan  Patient Instructions  Begin Augmentin 83m Twice daily, take with food.  Increase fluids.  Continue on Prednisone 10 mg daily Continue Imuran  Continue on Bactrim.  Continue on oxygen-goal is to keep oxygen levels greater than 88 to 90% Continue with Pulmonary rehab  Work on healthy weight loss.  Follow-up in 3 months with Dr. RChase Callerin ILD clinic.  Please contact office for sooner follow up if symptoms do not improve or worsen or seek emergency care        Chronic respiratory failure with hypoxia ([place] Remains on oxygen 8 L.  Continue to maintain O2 saturations greater than 88 to 90%.  Dysuria Suspected urinary tract infection with dysuria, urgency, hematuria.  Patient did take a Pyridium this morning.  Urine culture and urine analysis has been sent to the lab.  We will treat with empiric Augmentin.  Patient previously had Proteus and Klebsiella.  With multidrug resistance.  Previously intermittent sensitivity to Augmentin. Advise if symptoms or not improving or worsen will need sooner follow-up.  Will refer to urology or PCP if symptoms persist.  She has a history of recurrent UTIs but has been doing better over the last several months.  Plan  Patient Instructions  Begin Augmentin 873mTwice daily, take with food.  Increase fluids.  Continue on Prednisone 10 mg daily Continue  Imuran  Continue on Bactrim.  Continue on oxygen-goal is to keep oxygen levels greater than 88 to 90% Continue with Pulmonary rehab  Work on healthy weight loss.  Follow-up in 3 months with Dr. RaChase Callern ILD clinic.  Please contact office for sooner follow up if symptoms do not improve or worsen or seek emergency care         TaRexene EdisonNP 11/11/2020

## 2020-11-12 LAB — URINE CULTURE
MICRO NUMBER:: 12397523
SPECIMEN QUALITY:: ADEQUATE

## 2020-11-13 ENCOUNTER — Encounter (HOSPITAL_COMMUNITY): Payer: Self-pay

## 2020-11-14 NOTE — Progress Notes (Signed)
Patient spoke with Rexene Edison NP, she is aware of results/recommendations.  Nothing further needed.

## 2020-11-18 ENCOUNTER — Encounter (HOSPITAL_COMMUNITY): Payer: Self-pay

## 2020-11-18 ENCOUNTER — Ambulatory Visit: Payer: BC Managed Care – PPO | Admitting: Adult Health

## 2020-11-19 ENCOUNTER — Other Ambulatory Visit (HOSPITAL_COMMUNITY): Payer: Self-pay

## 2020-11-20 ENCOUNTER — Encounter (HOSPITAL_COMMUNITY): Payer: Self-pay

## 2020-11-24 ENCOUNTER — Other Ambulatory Visit (HOSPITAL_COMMUNITY): Payer: Self-pay

## 2020-11-25 ENCOUNTER — Encounter (HOSPITAL_COMMUNITY)
Admission: RE | Admit: 2020-11-25 | Discharge: 2020-11-25 | Disposition: A | Discharge status: Home / Self Care | Payer: Self-pay | Source: Ambulatory Visit | Attending: Internal Medicine | Admitting: Internal Medicine

## 2020-11-25 ENCOUNTER — Other Ambulatory Visit: Payer: Self-pay | Admitting: Adult Health

## 2020-11-25 ENCOUNTER — Other Ambulatory Visit: Payer: Self-pay

## 2020-11-25 DIAGNOSIS — J849 Interstitial pulmonary disease, unspecified: Secondary | ICD-10-CM | POA: Insufficient documentation

## 2020-11-27 ENCOUNTER — Encounter (HOSPITAL_COMMUNITY): Payer: Self-pay

## 2020-12-02 ENCOUNTER — Encounter (HOSPITAL_COMMUNITY): Payer: Self-pay

## 2020-12-04 ENCOUNTER — Encounter (HOSPITAL_COMMUNITY): Payer: Self-pay

## 2020-12-09 ENCOUNTER — Encounter (HOSPITAL_COMMUNITY): Payer: Self-pay

## 2020-12-11 ENCOUNTER — Encounter (HOSPITAL_COMMUNITY): Payer: Self-pay

## 2020-12-15 ENCOUNTER — Other Ambulatory Visit: Payer: Self-pay | Admitting: *Deleted

## 2020-12-15 DIAGNOSIS — J849 Interstitial pulmonary disease, unspecified: Secondary | ICD-10-CM

## 2020-12-16 ENCOUNTER — Encounter (HOSPITAL_COMMUNITY): Payer: Self-pay

## 2020-12-18 ENCOUNTER — Encounter (HOSPITAL_COMMUNITY): Payer: Self-pay

## 2020-12-23 ENCOUNTER — Encounter (HOSPITAL_COMMUNITY): Payer: Self-pay

## 2020-12-23 DIAGNOSIS — J849 Interstitial pulmonary disease, unspecified: Secondary | ICD-10-CM | POA: Insufficient documentation

## 2020-12-25 ENCOUNTER — Encounter (HOSPITAL_COMMUNITY): Payer: Self-pay

## 2020-12-27 ENCOUNTER — Other Ambulatory Visit: Payer: Self-pay | Admitting: Internal Medicine

## 2020-12-30 ENCOUNTER — Encounter (HOSPITAL_COMMUNITY)
Admission: RE | Admit: 2020-12-30 | Discharge: 2020-12-30 | Disposition: A | Discharge status: Home / Self Care | Payer: Self-pay | Source: Ambulatory Visit | Attending: Internal Medicine | Admitting: Internal Medicine

## 2020-12-30 ENCOUNTER — Other Ambulatory Visit: Payer: Self-pay

## 2020-12-31 ENCOUNTER — Encounter: Payer: BC Managed Care – PPO | Admitting: *Deleted

## 2020-12-31 ENCOUNTER — Ambulatory Visit (INDEPENDENT_AMBULATORY_CARE_PROVIDER_SITE_OTHER): Payer: BC Managed Care – PPO | Admitting: Adult Health

## 2020-12-31 ENCOUNTER — Encounter: Payer: Self-pay | Admitting: Adult Health

## 2020-12-31 DIAGNOSIS — Z006 Encounter for examination for normal comparison and control in clinical research program: Secondary | ICD-10-CM

## 2020-12-31 DIAGNOSIS — J849 Interstitial pulmonary disease, unspecified: Secondary | ICD-10-CM | POA: Diagnosis not present

## 2020-12-31 DIAGNOSIS — J9611 Chronic respiratory failure with hypoxia: Secondary | ICD-10-CM

## 2020-12-31 NOTE — Progress Notes (Signed)
_0  ID: Tasha Ballard, female    DOB: 1970-03-19, 50 y.o.   MRN: [id number]  Chief Complaint  Patient presents with   Follow-up    Referring provider: Gara Kroner, DO  HPI: 50 year old female never smoker followed for very complicated medical history with interstitial lung disease-progressive NSIP.  Pulmonary consult 2019 to establish for interstitial lung disease.  Previously followed at [facility]. Underwent surgical lung biopsy 2013 that showed fibrotic NSIP initially treated with CellCept.  Around 2016 has significant decline and was started on oxygen.  She has been evaluated at [facility] with Threasa Alpha in 2019 felt to have progressive NSIP.  Patient has a very complicated medical history including gastric artery aneurysm with repair in 2017.  Post surgery had progressive decline and increased symptom burden.  She was treated with 1 full year of oral and IV Cytoxan from December 2019 to November 2020.  She was started on Ofev in December 2019.  Started on Imuran in January 2021.  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 Patient is maintained on oxygen 6 L at rest and 8 to 10 L with ambulation.  With exercise she does require 10 L of oxygen.  She is on chronic steroids with prednisone 10 mg daily. Treated for probable lung cancer and 2021 with a 1 cm left lower lobe nodule with showing slow growth up to 1.5 cm.  PET scan showed marked hypermetabolic activity.  No evidence of distant disease.  She was felt to be high risk for surgical intervention.  She underwent chemo and SBRT completed in July 2021. Had COVID-19 infection in February 2022 received monoclonal antibody infusion. She is currently following with [facility] transplant team and is on the lung transplant list.  (Beginning September 2022.  She is currently living in [facility] awaiting transplant) Off of Ofev and Esbriet since September 2022  TEST/EVENTS :  High-resolution CT chest  April 27, 2019-stable UIP changes no progression since September 2020.  New 1 cm left lower lobe nodule   High-resolution CT chest Jul 19, 2019-interval growth of the left lower lobe nodule 1.5 cm.  UIP changes stable   CT chest October 02, 2019 stable UIP pattern.  Slight interval decrease in the left lung base nodule.  Consistent with treatment response.   January 24, 2020 CT chest underlying changes of fibrotic lung disease, relatively stable groundglass attenuation in left base.  Previous left medial lung nodule measuring 1.2 cm no longer discretely visible.  Possibly secondary to increased consolidative opacity along the left lung base.  Unchanged left upper lobe 7 mm nodule.  Nodular opacity in the medial left base.   PFT January 25, 2020 FEV1 40%, ratio 70, FVC 35%, DLCO 27% PFT November 20, 2018 FEV1 40%, ratio 91, FVC 35%, DLCO 37% PFT March 30, 2018 FEV1 38%, ratio 93%, FVC 32%, DLCO 34%  12/31/20  Follow up : ILD , O2 RF  Patient returns for a 81-monthfollow-up.  Patient has progressive NSIP and remains on aggressive regimen with Imuran and prednisone.  [facility]  She is currently on the lung transplant list.  She has been taken off of Ofev and Esbriet and September.  She remains on chronic oxygen therapy 10l/m . Has to use up to 25l/m with exercise in pulmonary reahb.   Patient's influenza COVID booster are up-to-date.  She did get Evusheld in Sept.  Overall patient says she is  slowly getting worse with increased oxygen demands and decreased activity tolerance.  Living in [place] awaiting transplant. Remains on Imuran and Prednisone 65m daily  Last month she did have some ongoing urinary symptoms.  Urine culture was unremarkable. Symptoms resolved.   Allergies  Allergen Reactions   Macrobid [Nitrofurantoin Macrocrystal] Nausea And Vomiting   Adhesive [Tape] Rash    Immunization History  Administered Date(s) Administered    Fluad Quad(high Dose 65+) 12/28/2019   Hep A / Hep B 10/09/2020, 10/16/2020, 11/05/2020   Influenza Split 11/12/2011, 12/24/2014, 02/05/2016, 01/07/2017, 11/22/2017   Influenza,inj,Quad PF,6+ Mos 12/24/2014, 02/05/2016, 01/07/2017, 11/22/2017, 10/20/2018, 11/20/2020   Influenza,inj,quad, With Preservative 11/22/2012, 12/24/2014, 02/05/2016, 01/07/2017   Influenza-Unspecified 12/24/2014, 02/05/2016, 01/07/2017, 11/22/2017   PFIZER Comirnaty(Gray Top)Covid-19 Tri-Sucrose Vaccine 10/09/2020   PFIZER(Purple Top)SARS-COV-2 Vaccination 04/06/2019, 04/30/2019, 10/18/2019   Pneumococcal Conjugate-13 07/20/2017   Pneumococcal Polysaccharide-23 06/09/2012, 01/03/2019   Tdap 02/19/2011, 10/09/2020    Past Medical History:  Diagnosis Date   [place]neurysm ([place]    Back pain    Cancer ([place]    Diabetes mellitus without complication ([place]    is on metformin due to prednisone denies diabetes   H/O blood clots    Hypertension    Joint pain    Pulmonary fibrosis ([place]    Pulmonary hypertension ([place] 06/2020   Mild noted on cath   SOB (shortness of breath)    Swelling    Thyroid disease    Vitamin D deficiency     Tobacco History: Social History   Tobacco Use  Smoking Status Never  Smokeless Tobacco Never   Counseling given: Not Answered   Outpatient Medications Prior to Visit  Medication Sig Dispense Refill   azaTHIOprine (IMURAN) 50 MG tablet TAKE 2 TABLETS (100MG) BY MOUTH EVERY DAY 180 tablet 1   benzonatate (TESSALON) 200 MG capsule Take 1 capsule (200 mg total) by mouth 3 (three) times daily as needed for cough. 45 capsule 3   carvedilol (COREG) 6.25 MG tablet Take 6.25 mg by mouth 2 (two) times daily with a meal.     famotidine (PEPCID) 40 MG tablet TAKE 1 TABLET BY MOUTH EVERY DAY (Patient taking differently: Take 40 mg by mouth daily.) 90 tablet 3   folic acid (FOLVITE) 1 MG tablet TAKE 1 TABLET BY MOUTH EVERY DAY (Patient taking differently: Take 1 mg by mouth daily.) 30 tablet 5    furosemide (LASIX) 40 MG tablet Take 40 mg by mouth daily. may repeat dose if needed for fluid retention.     gabapentin (NEURONTIN) 100 MG capsule Take 100 mg by mouth 2 (two) times daily as needed.     metFORMIN (GLUCOPHAGE) 500 MG tablet Take 500 mg by mouth 2 (two) times daily.     montelukast (SINGULAIR) 10 MG tablet TAKE 1 TABLET BY MOUTH EVERY DAY AFTER SUPPER 90 tablet 1   ondansetron (ZOFRAN) 8 MG tablet TAKE 1 TABLET BY MOUTH EVERY 8 HOURS AS NEEDED FOR NAUSEA AND VOMITING (Patient taking differently: Take 8 mg by mouth every 8 (eight) hours as needed for nausea or vomiting.) 45 tablet 2   phenazopyridine (PYRIDIUM) 100 MG tablet Take 1 tablet (100 mg total) by mouth 3 (three) times daily as needed for pain. 10 tablet 0   potassium chloride (KLOR-CON) 10 MEQ tablet TAKE 3 TABLETS BY MOUTH ONCE DAILY 90 tablet 1   pravastatin (PRAVACHOL) 20 MG tablet Take by mouth.     predniSONE (DELTASONE) 10 MG tablet Take 1 tablet (10 mg total)  by mouth daily.     sodium chloride (OCEAN) 0.65 % SOLN nasal spray Place 1 spray into both nostrils daily as needed for congestion.     spironolactone (ALDACTONE) 100 MG tablet Take 100 mg by mouth daily.     sulfamethoxazole-trimethoprim (BACTRIM) 400-80 MG tablet Take 1 tablet by mouth every Monday, Wednesday, and Friday. 12 tablet 3   thyroid (ARMOUR) 90 MG tablet Take 90 mg by mouth daily before breakfast.      Vitamin D, Ergocalciferol, (DRISDOL) 1.25 MG (50000 UT) CAPS capsule TAKE 1 CAPSULE BY MOUTH ONCE WEEKLY (Patient taking differently: Take 50,000 Units by mouth every 7 (seven) days.) 3 capsule 0   OFEV 150 MG CAPS TAKE 1 CAPSULE TWICE A DAY (12 HOURS APART) WITH FOOD (Patient not taking: No sig reported) 60 capsule 11   Pirfenidone (ESBRIET) 801 MG TABS TAKE 1 TABLET BY MOUTH 3 TIMES DAILY WITH MEALS (Patient not taking: No sig reported) 90 tablet 11   No facility-administered medications prior to visit.     Review of Systems:   Constitutional:    No  weight loss, night sweats,  Fevers, chills,  +fatigue, or  lassitude.  HEENT:   No headaches,  Difficulty swallowing,  Tooth/dental problems, or  Sore throat,                No sneezing, itching, ear ache, nasal congestion, post nasal drip,   CV:  No chest pain,  Orthopnea, PND, swelling in lower extremities, anasarca, dizziness, palpitations, syncope.   GI  No heartburn, indigestion, abdominal pain, nausea, vomiting, diarrhea, change in bowel habits, loss of appetite, bloody stools.   Resp:   No chest wall deformity  Skin: no rash or lesions.  GU: no dysuria, change in color of urine, no urgency or frequency.  No flank pain, no hematuria   MS:  No joint pain or swelling.  No decreased range of motion.  No back pain.    Physical Exam  BP 112/72 (BP Location: Left Arm, Patient Position: Sitting, Cuff Size: Normal)   Pulse (!) 102   Temp 98.2 F (36.8 C) (Oral)   Ht _0  (1.575 m)   Wt 175 lb (79.4 kg)   SpO2 95%   BMI 32.01 kg/m   GEN: A/Ox3; pleasant , NAD, well nourished on o2    HEENT:  [place]/AT,   NOSE-clear, THROAT-clear, no lesions, no postnasal drip or exudate noted.   NECK:  Supple w/ fair ROM; no JVD; normal carotid impulses w/o bruits; no thyromegaly or nodules palpated; no lymphadenopathy.    RESP  BB crackles   no accessory muscle use, no dullness to percussion  CARD:  RRR, no m/r/g, no peripheral edema, pulses intact, no cyanosis or clubbing.  GI:   Soft & nt; nml bowel sounds; no organomegaly or masses detected.   Musco: Warm bil, no deformities or joint swelling noted.   Neuro: alert, no focal deficits noted.    Skin: Warm, no lesions or rashes    Lab Results:  CBC  BMET     PFT Results Latest Ref Rng & Units 01/25/2020 11/20/2018 03/30/2018  FVC-Pre L 1.15 1.16 1.09  FVC-Predicted Pre % 35 35 32  Pre FEV1/FVC % % 92 91 93  FEV1-Pre L 1.06 1.06 1.01  FEV1-Predicted Pre % 40 40 38  DLCO uncorrected ml/min/mmHg 5.44 7.29 6.85  DLCO UNC%  % 27 37 34  DLCO corrected ml/min/mmHg 11.78 7.45 -  DLCO COR %Predicted % 60  41 -  DLVA Predicted % 150 93 92    No results found for: NITRICOXIDE      Assessment & Plan:   ILD (interstitial lung disease) ([facility]) Progressive fibrotic NSIP-patient now on lung transplant list at [facility]. Continue on current regimen.  Plan  Patient Instructions  Continue on Prednisone 10 mg daily Continue Imuran  Continue on oxygen-goal is to keep oxygen levels greater than 88 to 90% Continue with Pulmonary rehab  Work on healthy weight loss.  Follow-up in 3 months with Dr. [name] in ILD clinic.  Please contact office for sooner follow up if symptoms do not improve or worsen or seek emergency care       Chronic respiratory failure with hypoxia ([place]) With progressive IPF patient with increased oxygen demands.  Required oxygen at 10 L to maintain O2 saturations.  Higher levels with exercise.

## 2020-12-31 NOTE — Research (Signed)
Title: Chronic Fibrosing Interstitial Lung Disease with Progressive Phenotype Prospective Outcomes (ILD-PRO) Registry    Protocol #: IPF-PRO-SUB, Clinical Trials # [id number], Sponsor: [employer]   Protocol Version Amendment 4 dated 12Sep2019  and confirmed current on  Consent Version for today's visit date of  Is [place] IRB Approved Version 26 Jan 2018 Revised 26 Jan 2018   Objectives:  Describe current approaches to diagnosis and treatment of chronic fibrosing ILDs with progressive phenotype  Describe the natural history of chronic fibrosing ILDs with progressive phenotype  Assess quality of life from self-administered participant reported questionnaires for each disease group  Describe participant interactions with the healthcare system, describe treatment practices across multiple institutions for each disease group  Collect biological samples linked to well characterized chronic fibrosing ILDs with progressive phenotype to identify disease biomarkers  Collect data and biological samples that will support future research studies.                                            Key Inclusion Criteria: Willing and able to provide informed consent  Age ? 30 years  Diagnosis of a non-IPF ILD of any duration, including, but not limited to Idiopathic Non-Specific Interstitial Pneumonia (INSIP), Unclassifiable Idiopathic Interstitial Pneumonias (IIPs), Interstitial Pneumonia with Autoimmune Features (IPAF), Autoimmune ILDs such as Rheumatoid Arthritis (RA-ILD) and Systemic Sclerosis (SSC-ILD), Chronic Hypersensitivity Pneumonitis (HP), Sarcoidosis or Exposure-related ILDs such as asbestosis.  Chronic fibrosing ILD defined by reticular abnormality with traction bronchiectasis with or without honeycombing confirmed by chest HRCT scan and/or lung biopsy.  Progressive phenotype as defined by fulfilling at least one of the criteria below of fibrotic changes (progression set point)  within the last 24 months regardless of treatment considered appropriate in individual ILDs:  decline in FVC % predicted (% pred) based on >10% relative decline  decline in FVC % pred based on ? 5 - <10% relative decline in FVC combined with worsening of respiratory symptoms as assessed by the site investigator  decline in FVC % pred based on ? 5 - <10% relative decline in FVC combined with increasing extent of fibrotic changes on chest imaging (HRCT scan) as assessed by the site investigator  decline in DLCO % pred based on ? 10% relative decline  worsening of respiratory symptoms as well as increasing extent of fibrotic changes on chest imaging (HRCT scan) as assessed by the site investigator independent of FVC change.     Key Exclusion Criteria: Malignancy, treated or untreated, other than skin or early stage prostate cancer, within the past 5 years  Currently listed for lung transplantation at the time of enrollment  Currently enrolled in a clinical trial at the time of enrollment in this registry       Clinical Research Coordinator / Research RN note : This visit for Subject [id number] with DOB: Aug 04, 1970 on 12/31/2020 for the above protocol is Visit/Encounter 6 and is for purpose of research.    Subject expressed continued interest and consent in continuing as a study subject. Subject confirmed that there was no change in contact information (e.g. address, telephone, email). Subject thanked for participation in research and contribution to science.      During this visit on , the subject completed the blood work and questionnaires per the above referenced protocol. Please refer to the subject's paper source binder for further details.   Signed by Leda Gauze  Risk analyst [employer]  [place], [place]

## 2020-12-31 NOTE — Patient Instructions (Addendum)
Continue on Prednisone 10 mg daily Continue Imuran  Continue on oxygen-goal is to keep oxygen levels greater than 88 to 90% Continue with Pulmonary rehab  Work on healthy weight loss.  Follow-up in 3 months with Dr. [name] in ILD clinic.  Please contact office for sooner follow up if symptoms do not improve or worsen or seek emergency care

## 2021-01-01 ENCOUNTER — Encounter (HOSPITAL_COMMUNITY): Payer: Self-pay

## 2021-01-01 NOTE — Assessment & Plan Note (Signed)
With progressive IPF patient with increased oxygen demands.  Required oxygen at 10 L to maintain O2 saturations.  Higher levels with exercise.

## 2021-01-01 NOTE — Assessment & Plan Note (Signed)
Progressive fibrotic NSIP-patient now on lung transplant list at [facility]. Continue on current regimen.  Plan  Patient Instructions  Continue on Prednisone 10 mg daily Continue Imuran  Continue on oxygen-goal is to keep oxygen levels greater than 88 to 90% Continue with Pulmonary rehab  Work on healthy weight loss.  Follow-up in 3 months with Dr. [name] in ILD clinic.  Please contact office for sooner follow up if symptoms do not improve or worsen or seek emergency care

## 2021-01-06 ENCOUNTER — Encounter (HOSPITAL_COMMUNITY): Payer: Self-pay

## 2021-01-08 ENCOUNTER — Encounter (HOSPITAL_COMMUNITY): Payer: Self-pay

## 2021-01-13 ENCOUNTER — Encounter (HOSPITAL_COMMUNITY): Payer: Self-pay

## 2021-01-20 ENCOUNTER — Encounter (HOSPITAL_COMMUNITY): Payer: Self-pay

## 2021-01-22 ENCOUNTER — Telehealth (HOSPITAL_COMMUNITY): Payer: Self-pay | Admitting: *Deleted

## 2021-01-22 NOTE — Telephone Encounter (Signed)
Tasha Ballard has decided to drop from the Pulmonary Rehab Maintenance program. She is attending Pulmonary Rehab at [facility] awaiting her lung transplant. She is attending there three days per week. We discussed that she can notify us at any time if her situation changes and she needs PR maintenance here. She voices understanding.

## 2021-02-05 ENCOUNTER — Other Ambulatory Visit: Payer: Self-pay | Admitting: Adult Health

## 2021-02-05 ENCOUNTER — Ambulatory Visit: Payer: Self-pay | Admitting: Nurse Practitioner

## 2021-02-13 ENCOUNTER — Other Ambulatory Visit: Payer: Self-pay | Admitting: Adult Health

## 2021-02-26 ENCOUNTER — Telehealth: Payer: Self-pay | Admitting: Adult Health

## 2021-02-26 DIAGNOSIS — J9612 Chronic respiratory failure with hypercapnia: Secondary | ICD-10-CM

## 2021-02-26 DIAGNOSIS — J849 Interstitial pulmonary disease, unspecified: Secondary | ICD-10-CM

## 2021-02-26 NOTE — Telephone Encounter (Signed)
Needs an updated order to DME [facility] so they can send in for her to [facility] for New O2 setting  15 l/m at rest and 25-30L/m walking .   Triage can you please send in order .   FYI Dr. [name]

## 2021-03-02 NOTE — Telephone Encounter (Signed)
Order has been placed to [facility]. Nothing further needed.

## 2021-03-04 ENCOUNTER — Telehealth (INDEPENDENT_AMBULATORY_CARE_PROVIDER_SITE_OTHER): Payer: BC Managed Care – PPO | Admitting: Adult Health

## 2021-03-04 ENCOUNTER — Other Ambulatory Visit: Payer: Self-pay

## 2021-03-04 DIAGNOSIS — J849 Interstitial pulmonary disease, unspecified: Secondary | ICD-10-CM | POA: Diagnosis not present

## 2021-03-04 DIAGNOSIS — J9612 Chronic respiratory failure with hypercapnia: Secondary | ICD-10-CM

## 2021-03-04 DIAGNOSIS — R309 Painful micturition, unspecified: Secondary | ICD-10-CM

## 2021-03-04 DIAGNOSIS — J019 Acute sinusitis, unspecified: Secondary | ICD-10-CM | POA: Diagnosis not present

## 2021-03-04 MED ORDER — POTASSIUM CHLORIDE ER 10 MEQ PO TBCR: 30.0000 meq | EXTENDED_RELEASE_TABLET | Freq: Every day | ORAL | 5 refills | Status: AC

## 2021-03-04 MED ORDER — FUROSEMIDE 40 MG PO TABS: 40.0000 mg | ORAL_TABLET | Freq: Every day | ORAL | 5 refills | Status: AC

## 2021-03-04 MED ORDER — SULFAMETHOXAZOLE-TRIMETHOPRIM 400-80 MG PO TABS: 1.0000 | ORAL_TABLET | ORAL | 5 refills | Status: AC

## 2021-03-04 MED ORDER — AMOXICILLIN-POT CLAVULANATE 875-125 MG PO TABS: 1.0000 | ORAL_TABLET | Freq: Two times a day (BID) | ORAL | 0 refills | Status: AC

## 2021-03-04 NOTE — Patient Instructions (Signed)
Augmentin 87m Twice daily  for 1 week  Saline nasal rinses As needed   Continue on Prednisone 10 mg daily Continue Imuran  Continue on Bactrim.  Continue on oxygen-goal is to keep oxygen levels greater than 88 to 90% Continue with Pulmonary rehab  Work on healthy weight loss.  [facility]  Follow-up in 3 months with Dr. RChase Callerin ILD clinic.  Please contact office for sooner follow up if symptoms do not improve or worsen or seek emergency care

## 2021-03-04 NOTE — Progress Notes (Signed)
Virtual Visit via Video Note  I connected with Tasha Ballard on 03/04/21 at 10:30 AM EST by a video enabled telemedicine application and verified that I am speaking with the correct person using two identifiers.  Location: Patient: Home  Provider: Office    I discussed the limitations of evaluation and management by telemedicine and the availability of in person appointments. The patient expressed understanding and agreed to proceed.  History of Present Illness: 51 year old female never smoker followed for very complicated medical history with interstitial lung disease-progressive NSIP currently on the transplant list at [facility]  ILD/Medical Hx timeline  Pulmonary consult 2019 to establish for interstitial lung disease.  Previously followed at [facility]. Underwent surgical lung biopsy 2013 that showed fibrotic NSIP initially treated with CellCept.  Around 2016 has significant decline and was started on oxygen.  She has been evaluated at [facility] with Threasa Alpha in 2019 felt to have progressive NSIP.  Patient has a very complicated medical history including gastric artery aneurysm with repair in 2017.  Post surgery had progressive decline and increased symptom burden.  She was treated with 1 full year of oral and IV Cytoxan from December 2019 to November 2020.  She was started on Ofev in December 2019.  Started on Imuran in January 2021.  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 On Prednisone 78m daily .  Treated for probable lung cancer and 2021 with a 1 cm left lower lobe nodule with showing slow growth up to 1.5 cm.  PET scan showed marked hypermetabolic activity.  No evidence of distant disease.  She was felt to be high risk for surgical intervention.  She underwent chemo and SBRT completed in July 2021. Had COVID-19 infection in February 2022 received monoclonal antibody infusion. OFEV/Esbriet stopped 10/2020 due to transplant listing.   Patient also has  history of recurrent UTIs.  Vaginal dryness.  Patient says occasionally she has some post void irritation.  No hematuria, nausea vomiting, back pain.   Today's video visit is for an acute office visit.  Patient complains that over the last 4 days she has had increased sinus congestion that has become green in nature.  She is worried that she is developing a sinus infection and does not want it to get out of hand.  She denies any fever, hemoptysis, chest pain.  Cough is slightly increased but no discolored mucus.  Has some mild sinus congestion.  She has increased her saline nasal rinses. [facility]  She says over the last few months she is progressively getting worse.  She has now requiring increased oxygen.  Currently at 12 to 15 L at rest and up to 25 L with exercise.  She continues to go to pulmonary rehab 5 days a week.  She says last week she did require 25 L to walk on the treadmill this week she is only been requiring 18 L.  She is also being watched for chronic hypercarbia as last venous gas did show increased CO2 levels.  She has not been started on BiPAP.  She has a venous gas due tomorrow. Flu vaccine and COVID boosters are up-to-date.  She did get Evusheld in Sept.   Past Medical History:  Diagnosis Date   [place]    Back pain    [place]cer ([place]    Diabetes mellitus without complication ([place]    is on metformin due to prednisone denies diabetes   H/O blood  clots    Hypertension    Joint pain    Pulmonary fibrosis ([place])    Pulmonary hypertension ([place]) 06/2020   Mild noted on cath   SOB (shortness of breath)    Swelling    Thyroid disease    Vitamin D deficiency     Current Outpatient Medications on File Prior to Visit  Medication Sig Dispense Refill   azaTHIOprine (IMURAN) 50 MG tablet TAKE 2 TABLETS (100MG) BY MOUTH EVERY DAY 180 tablet 1   benzonatate (TESSALON) 200 MG capsule Take 1 capsule (200 mg total) by mouth 3  (three) times daily as needed for cough. 45 capsule 3   carvedilol (COREG) 6.25 MG tablet Take 6.25 mg by mouth 2 (two) times daily with a meal.     famotidine (PEPCID) 40 MG tablet TAKE 1 TABLET BY MOUTH EVERY DAY (Patient taking differently: Take 40 mg by mouth daily.) 90 tablet 3   folic acid (FOLVITE) 1 MG tablet TAKE 1 TABLET BY MOUTH EVERY DAY 30 tablet 5   gabapentin (NEURONTIN) 100 MG capsule Take 100 mg by mouth 2 (two) times daily as needed.     metFORMIN (GLUCOPHAGE) 500 MG tablet Take 500 mg by mouth 2 (two) times daily.     montelukast (SINGULAIR) 10 MG tablet TAKE 1 TABLET BY MOUTH EVERY DAY AFTER SUPPER 90 tablet 1   phenazopyridine (PYRIDIUM) 100 MG tablet Take 1 tablet (100 mg total) by mouth 3 (three) times daily as needed for pain. 10 tablet 0   pravastatin (PRAVACHOL) 20 MG tablet Take by mouth.     predniSONE (DELTASONE) 10 MG tablet Take 1 tablet (10 mg total) by mouth daily.     sodium chloride (OCEAN) 0.65 % SOLN nasal spray Place 1 spray into both nostrils daily as needed for congestion.     spironolactone (ALDACTONE) 100 MG tablet Take 100 mg by mouth daily.     thyroid (ARMOUR) 90 MG tablet Take 90 mg by mouth daily before breakfast.      Vitamin D, Ergocalciferol, (DRISDOL) 1.25 MG (50000 UT) CAPS capsule TAKE 1 CAPSULE BY MOUTH ONCE WEEKLY (Patient taking differently: Take 50,000 Units by mouth every 7 (seven) days.) 3 capsule 0   ondansetron (ZOFRAN) 8 MG tablet TAKE 1 TABLET BY MOUTH EVERY 8 HOURS AS NEEDED FOR NAUSEA AND VOMITING (Patient not taking: Reported on 03/04/2021) 45 tablet 2   No current facility-administered medications on file prior to visit.       Observations/Objective: Appears chronically ill on oxygen.  Does not appear to be in any acute distress.  Talking in full sentences on oxygen  TEST:   High-resolution CT chest April 27, 2019-stable UIP changes no progression since September 2020.  New 1 cm left lower lobe nodule   High-resolution CT  chest Jul 19, 2019-interval growth of the left lower lobe nodule 1.5 cm.  UIP changes stable   CT chest October 02, 2019 stable UIP pattern.  Slight interval decrease in the left lung base nodule.  Consistent with treatment response.   January 24, 2020 CT chest underlying changes of fibrotic lung disease, relatively stable groundglass attenuation in left base.  Previous left medial lung nodule measuring 1.2 cm no longer discretely visible.  Possibly secondary to increased consolidative opacity along the left lung base.  Unchanged left upper lobe 7 mm nodule.  Nodular opacity in the medial left base.   PFT January 25, 2020 FEV1 40%, ratio 70, FVC 35%, DLCO 27% PFT November 20, 2018 FEV1 40%, ratio 91, FVC 35%, DLCO 37% PFT March 30, 2018 FEV1 38%, ratio 93%, FVC 32%, DLCO 34%   Assessment and Plan: Acute URI/sinusitis.  Will begin Augmentin x7 days.  Patient is continue on saline nasal rinses.  May use Mucinex as needed.  Interstitial lung disease/progressive fibrotic NSIP.  Patient is continue on the [facility] list at [facility]. Patient is requesting refills of her medications.  This [place] pharmacy [place].  Chronic hypoxic respiratory failure.  Patient is having progressively increased oxygen needs due to her underlying interstitial lung disease.  Patient's continue on oxygen to maintain O2 saturations greater than 88 to 90%  Physical deconditioning.  Continue with pulmonary rehab  Post void urinary pain-possible due to vaginal dryness.  Patient is continue on current regimen.  If symptoms persist will need a urinalysis and urine culture.  Plan  Patient Instructions  Augmentin 867m Twice daily  for 1 week  Saline nasal rinses As needed   Continue on Prednisone 10 mg daily Continue Imuran  Continue on Bactrim.  Continue on oxygen-goal is to keep oxygen levels greater than 88 to 90% Continue with Pulmonary rehab  Work on healthy weight loss.  [facility]  Follow-up in 3 months with Dr. RChase Callerin ILD clinic.  Please contact office for sooner follow up if symptoms do not improve or worsen or seek emergency care      Follow Up Instructions:    I discussed the assessment and treatment plan with the patient. The patient was provided an opportunity to ask questions and all were answered. The patient agreed with the plan and demonstrated an understanding of the instructions.   The patient was advised to call back or seek an in-person evaluation if the symptoms worsen or if the condition fails to improve as anticipated.  I provided 35  minutes of non-face-to-face time during this encounter.   TRexene Edison NP

## 2021-03-06 ENCOUNTER — Telehealth: Payer: Self-pay | Admitting: Adult Health

## 2021-03-06 DIAGNOSIS — J9612 Chronic respiratory failure with hypercapnia: Secondary | ICD-10-CM

## 2021-03-06 DIAGNOSIS — J9611 Chronic respiratory failure with hypoxia: Secondary | ICD-10-CM

## 2021-03-06 DIAGNOSIS — J849 Interstitial pulmonary disease, unspecified: Secondary | ICD-10-CM

## 2021-03-06 NOTE — Telephone Encounter (Signed)
Patient called in requesting order to be sent to her homecare company for BiPAP. Patient is currently on the transplant list at [facility].  She continues with pulmonary rehab oxygen demands have increased.  She weekly labs are showing worsening chronic hypercarbia.  The venous gas has shown PCO2 slowly increasing with levels at  70-80 over the last 2 weeks.  Per patient transplant team is requesting she begin on nocturnal BiPAP.  Care everywhere notes were reviewed from February 17, 2021 from pulmonary recommending BiPAP.  Discussed case with Dr. [name] and is in agreement.  Please send order to [facility] of choice of patient For BiPAP  IPAP 10/ EPAP 5cmH2O  (using lower pressure due to severe fibrosis)  Mask of choice .  Needs follow up in 4 weeks .  Stat order

## 2021-03-06 NOTE — Telephone Encounter (Signed)
Order has been placed for pt's BIPAP as urgent.  Pt currently has a f/u with MR 04/02/21. Nothing further needed.

## 2021-03-12 ENCOUNTER — Telehealth: Payer: Self-pay | Admitting: Adult Health

## 2021-03-12 NOTE — Telephone Encounter (Signed)
--  Needs O2 portable  tanks that will go up to 25 l/m.  Please reach out to [place] to see if they can do this if not will need to call  [employer] -[phone]  To see about a company in [place] that supplies

## 2021-03-13 NOTE — Telephone Encounter (Signed)
Called [employer] and spoke with Coralyn Mark regarding portable oxygen tanks that will go up to 25 l/m as well as a regulator.  She stated that the service rep dropped it off yesterday and that her son is taking it to her today.  Nothing further needed.

## 2021-03-23 ENCOUNTER — Ambulatory Visit: Payer: BC Managed Care – PPO | Admitting: Nurse Practitioner

## 2021-03-27 ENCOUNTER — Ambulatory Visit: Payer: BC Managed Care – PPO | Admitting: Nurse Practitioner

## 2021-03-31 ENCOUNTER — Ambulatory Visit: Payer: BC Managed Care – PPO | Admitting: Nurse Practitioner

## 2021-04-02 ENCOUNTER — Ambulatory Visit: Payer: BC Managed Care – PPO | Admitting: Internal Medicine

## 2021-04-24 ENCOUNTER — Telehealth (INDEPENDENT_AMBULATORY_CARE_PROVIDER_SITE_OTHER): Payer: BC Managed Care – PPO | Admitting: Adult Health

## 2021-04-24 ENCOUNTER — Encounter: Payer: Self-pay | Admitting: Adult Health

## 2021-04-24 DIAGNOSIS — J019 Acute sinusitis, unspecified: Secondary | ICD-10-CM | POA: Diagnosis not present

## 2021-04-24 DIAGNOSIS — J849 Interstitial pulmonary disease, unspecified: Secondary | ICD-10-CM

## 2021-04-24 DIAGNOSIS — J9611 Chronic respiratory failure with hypoxia: Secondary | ICD-10-CM | POA: Diagnosis not present

## 2021-04-24 MED ORDER — AZITHROMYCIN 250 MG PO TABS: ORAL_TABLET | ORAL | 0 refills | Status: AC

## 2021-04-24 NOTE — Progress Notes (Signed)
Virtual Visit via Video Note ? ?I connected with Tasha Ballard on 04/24/21 at  9:00 AM EST by a video enabled telemedicine application and verified that I am speaking with the correct person using two identifiers. ? ?Location: ?Patient: Home  ?Provider: Office  ?  ?I discussed the limitations of evaluation and management by telemedicine and the availability of in person appointments. The patient expressed understanding and agreed to proceed. ? ?History of Present Illness: ?51 year old female never smoker followed for very complicated medical history with progressive NSIP-ILD currently on transplant list at [facility] with chronic oxygen dependent hypoxic and hypercarbic respiratory failure on high flow oxygen and nocturnal BiPAP ? ?Today's video visit is for an acute office visit.  Patient says that over the last 3 to 4 days she has noticed that her allergies have been more pronounced.  She has been having increased nasal congestion postnasal drainage drippy nose and now her nasal discharge is starting to turn green.  Patient is on high flow oxygen with 20 to 30 L at rest and 50 L with exercise and walking.  It makes it very hard if her nose is stopped up or she has any significant sinus symptoms.  Patient had a sinus infection about 2 months ago she was treated with Augmentin and says that her symptoms improved and was doing better.  She continues on the transplant list at [facility].  Over the last several weeks she continues to have significant symptom burden with significantly increased oxygen demands up to 50 L with walking and exercise.  She has decreased activity tolerance she is still trying to do pulmonary rehab but it is becoming harder and harder.  She has moved her bedroom down to first floor and is very hard to get into the shower now.  She is also developed some anxiety.  She has been started on nocturnal BiPAP which she feels has really helped over the last few weeks.  She is wearing this at  night about 8 to 10 hours.  She says this has definitely made a difference.  She denies any hemoptysis, fever, body aches, nausea vomiting or diarrhea. ? ?Past Medical History:  ?Diagnosis Date  ? Aneurysm ([place])   ? Back pain   ? Cancer [facility])   ? Diabetes mellitus without complication ([place])   ? is on metformin due to prednisone denies diabetes  ? H/O blood clots   ? Hypertension   ? Joint pain   ? Pulmonary fibrosis ([place])   ? Pulmonary hypertension ([place]) 06/2020  ? Mild noted on cath  ? SOB (shortness of breath)   ? Swelling   ? Thyroid disease   ? Vitamin D deficiency   ?  ?Current Outpatient Medications on File Prior to Visit  ?Medication Sig Dispense Refill  ? azaTHIOprine (IMURAN) 50 MG tablet TAKE 2 TABLETS (100MG) BY MOUTH EVERY DAY 180 tablet 1  ? benzonatate (TESSALON) 200 MG capsule Take 1 capsule (200 mg total) by mouth 3 (three) times daily as needed for cough. 45 capsule 3  ? carvedilol (COREG) 6.25 MG tablet Take 6.25 mg by mouth 2 (two) times daily with a meal.    ? famotidine (PEPCID) 40 MG tablet TAKE 1 TABLET BY MOUTH EVERY DAY (Patient taking differently: Take 40 mg by mouth daily.) 90 tablet 3  ? folic acid (FOLVITE) 1 MG tablet TAKE 1 TABLET BY MOUTH EVERY DAY 30 tablet 5  ? furosemide (LASIX) 40 MG tablet Take 1 tablet (40  mg total) by mouth daily. may repeat dose if needed for fluid retention. 30 tablet 5  ? gabapentin (NEURONTIN) 100 MG capsule Take 100 mg by mouth 2 (two) times daily as needed.    ? metFORMIN (GLUCOPHAGE) 500 MG tablet Take 500 mg by mouth 2 (two) times daily.    ? montelukast (SINGULAIR) 10 MG tablet TAKE 1 TABLET BY MOUTH EVERY DAY AFTER SUPPER 90 tablet 1  ? ondansetron (ZOFRAN) 8 MG tablet TAKE 1 TABLET BY MOUTH EVERY 8 HOURS AS NEEDED FOR NAUSEA AND VOMITING (Patient not taking: Reported on 03/04/2021) 45 tablet 2  ? phenazopyridine (PYRIDIUM) 100 MG tablet Take 1 tablet (100 mg total) by mouth 3 (three) times daily as needed for pain. 10 tablet 0  ? potassium chloride  (KLOR-CON) 10 MEQ tablet Take 3 tablets (30 mEq total) by mouth daily. 90 tablet 5  ? pravastatin (PRAVACHOL) 20 MG tablet Take by mouth.    ? predniSONE (DELTASONE) 10 MG tablet Take 1 tablet (10 mg total) by mouth daily.    ? sodium chloride (OCEAN) 0.65 % SOLN nasal spray Place 1 spray into both nostrils daily as needed for congestion.    ? spironolactone (ALDACTONE) 100 MG tablet Take 100 mg by mouth daily.    ? sulfamethoxazole-trimethoprim (BACTRIM) 400-80 MG tablet Take 1 tablet by mouth 3 (three) times a week. 12 tablet 5  ? thyroid (ARMOUR) 90 MG tablet Take 90 mg by mouth daily before breakfast.     ? Vitamin D, Ergocalciferol, (DRISDOL) 1.25 MG (50000 UT) CAPS capsule TAKE 1 CAPSULE BY MOUTH ONCE WEEKLY (Patient taking differently: Take 50,000 Units by mouth every 7 (seven) days.) 3 capsule 0  ? ?No current facility-administered medications on file prior to visit.  ? ? ? ? ?Observations/Objective: ?ILD/Medical Hx timeline  ?Pulmonary consult 2019 to establish for interstitial lung disease.  Previously followed at [facility]. ?Underwent surgical lung biopsy 2013 that showed fibrotic NSIP initially treated with CellCept.  Around 2016 has significant decline and was started on oxygen.  She has been evaluated at [facility] with Threasa Alpha in 2019 felt to have progressive NSIP.  Patient has a very complicated medical history including gastric artery aneurysm with repair in 2017.  Post surgery had progressive decline and increased symptom burden.  She was treated with 1 full year of oral and IV Cytoxan from December 2019 to November 2020.  She was started on Ofev in December 2019.  Started on Imuran in January 2021.  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 On Prednisone 66m daily .  ?Treated for probable lung cancer and 2021 with a 1 cm left lower lobe nodule with showing slow growth up to 1.5 cm.  PET scan showed marked hypermetabolic activity.  No evidence of distant disease.  She  was felt to be high risk for surgical intervention.  She underwent chemo and SBRT completed in July 2021. ?Had COVID-19 infection in February 2022 received monoclonal antibody infusion. ?OFEV/Esbriet stopped 10/2020 due to transplant listing.  ? ?Assessment and Plan: ?Acute sinusitis-begin Z-Pak.  Saline nasal rinses discussed.  May use Mucinex as needed. ? ?Progressive fibrotic NSIP [facility]  Continue with current regimen. ? ?Chronic hypoxic and hypercarbic respiratory failure with worsening symptom burden and increased oxygen demands.  Patient is continue on nocturnal BiPAP.  Continue on oxygen high flow to maintain O2 saturations greater than 88 to 90%.-Keep follow-up next week as planned ? ?Severe physical  deconditioning continue pulmonary rehab as able ? ?Plan  ?Patient Instructions  ?ZPack take as directed.  ?Saline nasal rinses As needed   ?Continue on Prednisone 10 mg daily ?Continue Imuran  ?Continue on Bactrim.  ?Continue on oxygen-goal is to keep oxygen levels greater than 88 to 90% ?Continue with Pulmonary rehab  ?Work on healthy weight loss.  ?[facility]  ?Follow-up in next week  with Dr. [name] in ILD clinic.  ?Please contact office for sooner follow up if symptoms do not improve or worsen or seek emergency care  ?  ? ? ?Follow Up Instructions: ? ?  ?I discussed the assessment and treatment plan with the patient. The patient was provided an opportunity to ask questions and all were answered. The patient agreed with the plan and demonstrated an understanding of the instructions. ?  ?The patient was advised to call back or seek an in-person evaluation if the symptoms worsen or if the condition fails to improve as anticipated. ? ?I provided 30  minutes of non-face-to-face time during this encounter. ? ? ?Rexene Edison, NP ? ?

## 2021-04-24 NOTE — Patient Instructions (Signed)
ZPack take as directed.  ?Saline nasal rinses As needed   ?Continue on Prednisone 10 mg daily ?Continue Imuran  ?Continue on Bactrim.  ?Continue on oxygen-goal is to keep oxygen levels greater than 88 to 90% ?Continue with Pulmonary rehab  ?Work on healthy weight loss.  ?Continue with [facility] Transplant follow up  ?Follow-up in next week  with Dr. [name] in ILD clinic.  ?Please contact office for sooner follow up if symptoms do not improve or worsen or seek emergency care  ? ?

## 2021-04-29 ENCOUNTER — Telehealth (HOSPITAL_COMMUNITY): Payer: Self-pay | Admitting: Family Medicine

## 2021-04-30 ENCOUNTER — Ambulatory Visit (INDEPENDENT_AMBULATORY_CARE_PROVIDER_SITE_OTHER): Payer: BC Managed Care – PPO | Admitting: Internal Medicine

## 2021-04-30 ENCOUNTER — Encounter: Payer: Self-pay | Admitting: Internal Medicine

## 2021-04-30 ENCOUNTER — Encounter: Payer: BC Managed Care – PPO | Admitting: *Deleted

## 2021-04-30 ENCOUNTER — Other Ambulatory Visit: Payer: Self-pay

## 2021-04-30 VITALS — BP 122/78 | HR 105 | Ht 62.0 in | Wt 188.4 lb

## 2021-04-30 DIAGNOSIS — J849 Interstitial pulmonary disease, unspecified: Secondary | ICD-10-CM

## 2021-04-30 DIAGNOSIS — J9611 Chronic respiratory failure with hypoxia: Secondary | ICD-10-CM

## 2021-04-30 DIAGNOSIS — J019 Acute sinusitis, unspecified: Secondary | ICD-10-CM

## 2021-04-30 DIAGNOSIS — Z006 Encounter for examination for normal comparison and control in clinical research program: Secondary | ICD-10-CM

## 2021-04-30 MED ORDER — DOXYCYCLINE HYCLATE 100 MG PO TABS: 100.0000 mg | ORAL_TABLET | Freq: Two times a day (BID) | ORAL | 0 refills | Status: AC

## 2021-04-30 NOTE — Research (Signed)
Title: Chronic Fibrosing Interstitial Lung Disease with Progressive Phenotype Prospective Outcomes (ILD-PRO) Registry  ?  ?Protocol #: IPF-PRO-SUB, Clinical Trials # [id number], Sponsor: [employer] ?  ?Protocol Version Amendment 4 dated 12Sep2019  and confirmed current on  ?Consent Version for today?s visit date of  Is [employer] IRB Approved Version 26 Jan 2018 Revised 26 Jan 2018 ?  ?Objectives:  ?Describe current approaches to diagnosis and treatment of chronic fibrosing ILDs with progressive phenotype  ?Describe the natural history of chronic fibrosing ILDs with progressive phenotype  ?Assess quality of life from self-administered participant reported questionnaires for each disease group  ?Describe participant interactions with the healthcare system, describe treatment practices across multiple institutions for each disease group  ?Collect biological samples linked to well characterized chronic fibrosing ILDs with progressive phenotype to identify disease biomarkers  ?Collect data and biological samples that will support future research studies.  ?                                          ?Key Inclusion Criteria: ?Willing and able to provide informed consent  ?Age ? 30 years  ?Diagnosis of a non-IPF ILD of any duration, including, but not limited to Idiopathic Non-Specific Interstitial Pneumonia (INSIP), Unclassifiable Idiopathic Interstitial Pneumonias (IIPs), Interstitial Pneumonia with Autoimmune Features (IPAF), Autoimmune ILDs such as Rheumatoid Arthritis (RA-ILD) and Systemic Sclerosis (SSC-ILD), Chronic Hypersensitivity Pneumonitis (HP), Sarcoidosis or Exposure-related ILDs such as asbestosis.  ?Chronic fibrosing ILD defined by reticular abnormality with traction bronchiectasis with or without honeycombing confirmed by chest HRCT scan and/or lung biopsy.  ?Progressive phenotype as defined by fulfilling at least one of the criteria below of fibrotic changes (progression set point)  within the last 24 months regardless of treatment considered appropriate in individual ILDs:  ?decline in FVC % predicted (% pred) based on >10% relative decline  ?decline in FVC % pred based on ? 5 - <10% relative decline in FVC combined with worsening of respiratory symptoms as assessed by the site investigator  ?decline in FVC % pred based on ? 5 - <10% relative decline in FVC combined with increasing extent of fibrotic changes on chest imaging (HRCT scan) as assessed by the site investigator  ?decline in DLCO % pred based on ? 10% relative decline  ?worsening of respiratory symptoms as well as increasing extent of fibrotic changes on chest imaging (HRCT scan) as assessed by the site investigator independent of FVC change.   ?  ?Key Exclusion Criteria: ?Malignancy, treated or untreated, other than skin or early stage prostate cancer, within the past 5 years  ?Currently listed for lung transplantation at the time of enrollment  ?Currently enrolled in a clinical trial at the time of enrollment in this registry   ?  ?  ?Clinical Research Coordinator / Research RN note : This visit for Tasha Ballard Subject [id number] with DOB: 1970/08/04 on 04/30/2021 for the above protocol is Visit/Encounter #10 and is for purpose of research.  ?  ?Subject expressed continued interest and consent in continuing as a study subject. Subject confirmed that there was no change in contact information (e.g. address, telephone, email). Subject thanked for participation in research and contribution to science.   ?  ? During this visit on 04/30/2021 , the subject completed the blood work and questionnaires per the above referenced protocol. Please refer to the subject's paper source binder for further details. ?  ?  Signed by ?Leda Gauze Jong Rickman ?Research Assistant ?[facility]  ?[place], [place] ? 04:50 PM 04/30/2021 ?

## 2021-04-30 NOTE — Patient Instructions (Signed)
ICD-10-CM   ?1. Acute sinusitis, recurrence not specified, unspecified location  J01.90   ?  ?2. ILD (interstitial lung disease) ([place])  J84.9 Ambulatory Referral for DME  ?  ?3. Chronic respiratory failure with hypoxia (HCC)  J96.11 Ambulatory Referral for DME  ?  ?4. Research subject  Z00.6   ?  ? ?Advanced fibrosis - awaiting lung transplant ?Mild acute sinusitis - s/p Z pak with partial relief ?High o2 need due to progressive disease  ?  -25L rest, 55L with exertion (goes through 15 tanks per day) ? ?Plan ? - continue o2 for pulse ox goal > 88% ? - Take doxycycline 195m po twice daily x 5 days; take after meals and avoid sunlight ? - call [place] and can consider steroid burst ?- refer initiation/maintenance pulmonary rehab ? - urgent pre-lung transplant ?-take order for wheelchair 04/30/2021 ?- do ILD-PRO registry visit 04/30/2021 In the same cubicle ? - get finger pusle ox with wrist strap ? ?Followup ? - 6-8 weeks video visit with Clariece Roesler or [name] Parrett ?

## 2021-04-30 NOTE — Progress Notes (Signed)
_0  ID: Tasha Ballard, female    DOB: 12/06/1970, 51 y.o.   MRN: [id number]  Chief Complaint  Patient presents with   Follow-up    Referring provider: Gara Kroner, DO  HPI: 51 year old female never smoker followed for very complicated medical history with interstitial lung disease-progressive NSIP.  Pulmonary consult 2019 to establish for interstitial lung disease.  Previously followed at [facility]. Underwent surgical lung biopsy 2013 that showed fibrotic NSIP initially treated with CellCept.  Around 2016 has significant decline and was started on oxygen.  She has been evaluated at [facility] with Threasa Alpha in 2019 felt to have progressive NSIP.  Patient has a very complicated medical history including gastric artery aneurysm with repair in 2017.  Post surgery had progressive decline and increased symptom burden.  She was treated with 1 full year of oral and IV Cytoxan from December 2019 to November 2020.  She was started on Ofev in December 2019.  Started on Imuran in January 2021.  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 Patient is maintained on oxygen 6 L at rest and 8 to 10 L with ambulation.  With exercise she does require 10 L of oxygen.  She is on chronic steroids with prednisone 10 mg daily. Treated for probable lung cancer and 2021 with a 1 cm left lower lobe nodule with showing slow growth up to 1.5 cm.  PET scan showed marked hypermetabolic activity.  No evidence of distant disease.  She was felt to be high risk for surgical intervention.  She underwent chemo and SBRT completed in July 2021. Had COVID-19 infection in February 2022 received monoclonal antibody infusion. She is currently following with [facility] transplant team and is on the lung transplant list.  (Beginning September 2022.  She is currently living in [facility] awaiting transplant) Off of Ofev and Esbriet since September 2022  TEST/EVENTS :  High-resolution CT chest  April 27, 2019-stable UIP changes no progression since September 2020.  New 1 cm left lower lobe nodule   High-resolution CT chest Jul 19, 2019-interval growth of the left lower lobe nodule 1.5 cm.  UIP changes stable   CT chest October 02, 2019 stable UIP pattern.  Slight interval decrease in the left lung base nodule.  Consistent with treatment response.   January 24, 2020 CT chest underlying changes of fibrotic lung disease, relatively stable groundglass attenuation in left base.  Previous left medial lung nodule measuring 1.2 cm no longer discretely visible.  Possibly secondary to increased consolidative opacity along the left lung base.  Unchanged left upper lobe 7 mm nodule.  Nodular opacity in the medial left base.   PFT January 25, 2020 FEV1 40%, ratio 70, FVC 35%, DLCO 27% PFT November 20, 2018 FEV1 40%, ratio 91, FVC 35%, DLCO 37% PFT March 30, 2018 FEV1 38%, ratio 93%, FVC 32%, DLCO 34%  12/31/20  Follow up : ILD , O2 RF  Patient returns for a 25-monthfollow-up.  Patient has progressive NSIP and remains on aggressive regimen with Imuran and prednisone.  [facility]  She is currently on the lung transplant list.  She has been taken off of Ofev and Esbriet and September.  She remains on chronic oxygen therapy 10l/m . Has to use up to 25l/m with exercise in pulmonary reahb.   Patient's influenza COVID booster are up-to-date.  She did get Evusheld in Sept.  Overall patient says she  is slowly getting worse with increased oxygen demands and decreased activity tolerance.  Living in [place] awaiting transplant. Remains on Imuran and Prednisone 91m daily  Last month she did have some ongoing urinary symptoms.  Urine culture was unremarkable. Symptoms resolved.   OV 04/30/2021  Subjective:  Patient ID: Tasha Ballard female , DOB: 7Mar 12, 1972, age 51y.o. , MRN: [id number], [address]: [id number] TGara Kroner  DO Patient Care Team: TGara Kroner DO as PCP - General (Family Medicine) MLarey Dresser MD as PCP - Advanced Heart Failure (Cardiology) RBrand Males MD as Consulting Physician (Pulmonary Disease)  This Provider for this visit: Treatment Team:  Attending Provider: RBrand Males MD    04/30/2021 -   Chief Complaint  Patient presents with   Follow-up    Pt states the past 3 months have been rough with having increased SOB. Pt states she does have an occasional cough.   Advanced NSIP interstitial lung disease progressive phenotype chronic hypoxic respiratory failure.  HPI Tasha STERRY89y.o. -currently on Imuran.  She is off nintedanib and pirfenidone.  [facility]  Since August 2022 she is on the list and been seeing them once a month but so far no lungs.  Apparently this because of her height.  She is very frustrated.  At this point in time she is 24 L oxygen at rest and 55 L with exertion.  [place]  The use 15 tanks of oxygen in a single day.  She is also using BiPAP at night.  She is really worried about her health and potential mortality.  She is really hopeful she will get a transplant.  [facility].  She is asked me to make order for this.  [facility]  The rehab has to happen urgently.  She is maintaining her weight well.  I also did an order for a wheelchair at her request.  Of note last week her sinus drainage turned green.  They gave her a Z-Pak it did not improve.  She is asking for another antibiotic.  Told her we will give doxycycline.  She completed ILD-Pro registry visit today.  She also wanted to get her finger pulse ox meter that  we will hold steadily.  We went through the Internet about this.  CT Chest data  No results found.    PFT  PFT Results Latest Ref Rng & Units 01/25/2020 11/20/2018 03/30/2018  FVC-Pre L 1.15 1.16 1.09  FVC-Predicted Pre % 35 35 32  Pre FEV1/FVC % % 92 91 93  FEV1-Pre L 1.06 1.06 1.01  FEV1-Predicted Pre % 40 40 38  DLCO uncorrected ml/min/mmHg 5.44 7.29 6.85  DLCO UNC% % 27 37 34  DLCO corrected ml/min/mmHg 11.78 7.45 -  DLCO COR %Predicted % 60 37 -  DLVA Predicted % 150 93 92       has a past medical history of Aneurysm ([place], Back pain, Cancer ([place], Diabetes mellitus without complication ([place], H/O blood clots, Hypertension, Joint pain, Pulmonary fibrosis ([place], Pulmonary hypertension ([place] (06/2020), SOB (shortness of breath), Swelling, Thyroid disease, and Vitamin D deficiency.   reports  that she has never smoked. She has never used smokeless tobacco.  Past Surgical History:  Procedure Laterality Date   ABLATION     COLONOSCOPY WITH PROPOFOL N/A 08/20/2020   Procedure: COLONOSCOPY WITH PROPOFOL;  Surgeon: Carol Ada, MD;  Location: [facility];  Service: Endoscopy;  Laterality: N/A;   ESOPHAGOGASTRODUODENOSCOPY (EGD) WITH PROPOFOL N/A 08/20/2020   Procedure: ESOPHAGOGASTRODUODENOSCOPY (EGD) WITH PROPOFOL;  Surgeon: Carol Ada, MD;  Location: [facility];  Service: Endoscopy;  Laterality: N/A;   IR IMAGING GUIDED PORT INSERTION  01/26/2018   IR REMOVAL TUN ACCESS W/ PORT W/O FL MOD SED  02/19/2020   KNEE SURGERY     LUNG BIOPSY     RIGHT/LEFT HEART CATH AND CORONARY ANGIOGRAPHY N/A 06/25/2020   Procedure: RIGHT/LEFT HEART CATH AND CORONARY ANGIOGRAPHY;  Surgeon: Larey Dresser, MD;  Location: [facility];  Service: Cardiovascular;  Laterality: N/A;    Allergies  Allergen Reactions   Macrobid [Nitrofurantoin Macrocrystal] Nausea And Vomiting   Adhesive [Tape] Rash    Immunization History  Administered Date(s) Administered   Fluad Quad(high Dose 65+)  12/28/2019   Hep A / Hep B 10/09/2020, 10/16/2020, 11/05/2020   Influenza Split 11/12/2011, 12/24/2014, 02/05/2016, 01/07/2017, 11/22/2017   Influenza,inj,Quad PF,6+ Mos 12/24/2014, 02/05/2016, 01/07/2017, 11/22/2017, 10/20/2018, 11/20/2020   Influenza,inj,quad, With Preservative 11/22/2012, 12/24/2014, 02/05/2016, 01/07/2017   Influenza-Unspecified 12/24/2014, 02/05/2016, 01/07/2017, 11/22/2017   PFIZER Comirnaty(Gray Top)Covid-19 Tri-Sucrose Vaccine 10/09/2020   PFIZER(Purple Top)SARS-COV-2 Vaccination 04/06/2019, 04/30/2019, 10/18/2019   Pneumococcal Conjugate-13 07/20/2017   Pneumococcal Polysaccharide-23 06/09/2012, 01/03/2019   Tdap 02/19/2011, 10/09/2020   Zoster Recombinat (Shingrix) 10/16/2020    Family History  Problem Relation Age of Onset   Heart failure Mother    Heart disease Mother    Rheumatologic disease Father      Current Outpatient Medications:    azaTHIOprine (IMURAN) 50 MG tablet, TAKE 2 TABLETS (100MG) BY MOUTH EVERY DAY, Disp: 180 tablet, Rfl: 1   benzonatate (TESSALON) 200 MG capsule, Take 1 capsule (200 mg total) by mouth 3 (three) times daily as needed for cough., Disp: 45 capsule, Rfl: 3   carvedilol (COREG) 6.25 MG tablet, Take 12.5 mg by mouth in the morning and at bedtime., Disp: , Rfl:    doxycycline (VIBRA-TABS) 100 MG tablet, Take 1 tablet (100 mg total) by mouth 2 (two) times daily., Disp: 10 tablet, Rfl: 0   famotidine (PEPCID) 40 MG tablet, TAKE 1 TABLET BY MOUTH EVERY DAY (Patient taking differently: Take 40 mg by mouth daily.), Disp: 90 tablet, Rfl: 3   folic acid (FOLVITE) 1 MG tablet, TAKE 1 TABLET BY MOUTH EVERY DAY, Disp: 30 tablet, Rfl: 5   furosemide (LASIX) 40 MG tablet, Take 1 tablet (40 mg total) by mouth daily. may repeat dose if needed for fluid retention., Disp: 30 tablet, Rfl: 5   gabapentin (NEURONTIN) 100 MG capsule, Take 100 mg by mouth 2 (two) times daily as needed., Disp: , Rfl:    hydrOXYzine (ATARAX) 25 MG tablet, Take by  mouth., Disp: , Rfl:    metFORMIN (GLUCOPHAGE) 500 MG tablet, Take 500 mg by mouth 2 (two) times daily., Disp: , Rfl:    montelukast (SINGULAIR) 10 MG tablet, TAKE 1 TABLET BY MOUTH EVERY DAY AFTER SUPPER, Disp: 90 tablet, Rfl: 1   ondansetron (ZOFRAN) 8 MG tablet, TAKE 1 TABLET BY MOUTH EVERY 8 HOURS AS NEEDED FOR NAUSEA AND VOMITING, Disp: 45 tablet, Rfl: 2   phenazopyridine (PYRIDIUM) 100 MG tablet, Take 1 tablet (100 mg total)  by mouth 3 (three) times daily as needed for pain., Disp: 10 tablet, Rfl: 0   potassium chloride (KLOR-CON) 10 MEQ tablet, Take 3 tablets (30 mEq total) by mouth daily., Disp: 90 tablet, Rfl: 5   pravastatin (PRAVACHOL) 20 MG tablet, Take by mouth., Disp: , Rfl:    predniSONE (DELTASONE) 10 MG tablet, Take 1 tablet (10 mg total) by mouth daily., Disp: , Rfl:    sodium chloride (OCEAN) 0.65 % SOLN nasal spray, Place 1 spray into both nostrils daily as needed for congestion., Disp: , Rfl:    spironolactone (ALDACTONE) 100 MG tablet, Take 100 mg by mouth daily., Disp: , Rfl:    sulfamethoxazole-trimethoprim (BACTRIM) 400-80 MG tablet, Take 1 tablet by mouth 3 (three) times a week., Disp: 12 tablet, Rfl: 5   thyroid (ARMOUR) 90 MG tablet, Take 90 mg by mouth daily before breakfast. , Disp: , Rfl:    Vitamin D, Ergocalciferol, (DRISDOL) 1.25 MG (50000 UT) CAPS capsule, TAKE 1 CAPSULE BY MOUTH ONCE WEEKLY (Patient taking differently: Take 50,000 Units by mouth every 7 (seven) days.), Disp: 3 capsule, Rfl: 0      Objective:   Vitals:   04/30/21 1440  BP: 122/78  Pulse: (!) 105  SpO2: 92%  Weight: 188 lb 6.4 oz (85.5 kg)  Height: _0  (1.575 m)    Estimated body mass index is 34.46 kg/m as calculated from the following:   Height as of this encounter: _1  (1.575 m).   Weight as of this encounter: 188 lb 6.4 oz (85.5 kg).  _2 @  Filed Weights   04/30/21 1440  Weight: 188 lb 6.4 oz (85.5 kg)     Physical Exam  General: No distress.  Looks better  than before Neuro: Alert and Oriented x 3. GCS 15. Speech normal Psych: Pleasant Resp:  Barrel Chest - no.  Wheeze - no, Crackles -mild, No overt respiratory distress CVS: Normal heart sounds. 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 PEERL +. No post nasal drip.  On oxygen        Assessment:       ICD-10-CM   1. Acute sinusitis, recurrence not specified, unspecified location  J01.90     2. ILD (interstitial lung disease) ([place])  J84.9 Ambulatory Referral for DME    AMB referral to Pulmonary Rehabilitation Maintenance Program ([facility] Only)    AMB referral to pulmonary rehabilitation    3. Chronic respiratory failure with hypoxia ([facility])  J96.11 Ambulatory Referral for DME    AMB referral to Pulmonary Rehabilitation Maintenance Program ([facility] Only)    AMB referral to pulmonary rehabilitation    4. Research subject  Z00.6      Her condition requires intensive monitoring.    Plan:     Patient Instructions     ICD-10-CM   1. Acute sinusitis, recurrence not specified, unspecified location  J01.90     2. ILD (interstitial lung disease) ([place])  J84.9 Ambulatory Referral for DME    3. Chronic respiratory failure with hypoxia (HCC)  J96.11 Ambulatory Referral for DME    4. Research subject  Z00.6      Advanced fibrosis - awaiting lung transplant Mild acute sinusitis - s/p Z pak with partial relief High o2 need due to progressive disease    -25L rest, 55L with exertion (goes through 15 tanks per day)  Plan  - continue o2 for pulse ox goal > 88%  - Take doxycycline 188m po  twice daily x 5 days; take after meals and avoid sunlight  - call us if no response and can consider steroid burst - refer initiation/maintenance pulmonary rehab  - urgent pre-lung transplant -take order for wheelchair 04/30/2021 - do ILD-PRO registry visit 04/30/2021 In the same cubicle  - get finger pusle ox with wrist strap  Followup  - 6-8 weeks video visit with  Dezhane Staten or [name] Parrett  ( Level 05 visit: Estb 40-54 min   in  visit type: on-site physical face to visit  in total care time and counseling or/and coordination of care by this undersigned MD - Dr Brand Males. This includes one or more of the following on this same day 04/30/2021: pre-charting, chart review, note writing, documentation discussion of test results, diagnostic or treatment recommendations, prognosis, risks and benefits of management options, instructions, education, compliance or risk-factor reduction. It excludes time spent by the [place] or office staff in the care of the patient. Actual time 40 min)   SIGNATURE    Dr. Brand Males, M.D., F.C.C.P,  Pulmonary and Critical Care Medicine Staff Physician, [facility] Director - Interstitial Lung Disease  Program  Pulmonary [place] at [place], [place], [address]  NPI Number:  NPI #[id number] [facility] Number: [id number]  Pager: [phone], If no answer  -> Check AMION or Try [phone] Telephone (clinical office): [phone] Telephone (research): [phone]  3:15 PM 04/30/2021

## 2021-05-01 ENCOUNTER — Telehealth: Payer: Self-pay | Admitting: Adult Health

## 2021-05-01 NOTE — Telephone Encounter (Signed)
Download has been printed. Will give this to TP when she returns to the office 3/14. ?

## 2021-05-01 NOTE — Telephone Encounter (Signed)
Of course will be back in office on 3/14, Im Out of office till then .  ? ?

## 2021-05-01 NOTE — Telephone Encounter (Signed)
Pt had an office visit with Dr. [name] 04/30/21. During that visit, pt said that she has been on BIPAP and per insurance purposes, she needs to have data reviewed to send in to insurance company and also to DME so that she has proof that she has been wearing it. ? ?[name], please advise if you would be okay reviewing download of pt's BIPAP data and if needed, do an addendum to the OV that you had with pt on 3/3?  Download is able to be seen in [place] and can be printed for you to be able to view. ?

## 2021-05-05 ENCOUNTER — Telehealth: Payer: Self-pay | Admitting: Adult Health

## 2021-05-05 DIAGNOSIS — J9611 Chronic respiratory failure with hypoxia: Secondary | ICD-10-CM

## 2021-05-05 NOTE — Telephone Encounter (Signed)
BiPAP download shows excellent compliance with 100% usage daily average usage at 9 hours.  Patient is on IPAP 10 cm H2O.  EPAP at 5 cm H2O.  AHI is 0.9 ? ?She has excellent control and compliance continue on current settings ?Please send to DME company as they are requesting a compliance report ?

## 2021-05-05 NOTE — Telephone Encounter (Signed)
Patient is aware of below message and voiced her understanding.  ?She will contact [place] if she is unable to adjust humidity.  ? ?PCC's, can you guys send compliance check note to [place] as requested.  ?

## 2021-05-05 NOTE — Telephone Encounter (Signed)
Yes she can increase for comfort . Can ask DME to increase if she cant  ?Download needs to go to DME they were requesting compliance check note.  ? ?

## 2021-05-05 NOTE — Telephone Encounter (Signed)
Spoke to patient and relayed below results.  ?She is asking if [name] thinks she could handle more humidity. ? ?[name], please advise. Thanks ?

## 2021-05-08 NOTE — Telephone Encounter (Signed)
What is a compliance check note? ?

## 2021-05-08 NOTE — Addendum Note (Signed)
Addended by: Elby Beck R on: 05/08/2021 02:48 PM ? ? Modules accepted: Orders ? ?

## 2021-05-08 NOTE — Telephone Encounter (Signed)
DME order has been placed with compliance note from [place]. Nothing further needed at this time.  ?

## 2021-06-04 ENCOUNTER — Telehealth: Payer: Self-pay | Admitting: Internal Medicine

## 2021-06-04 NOTE — Telephone Encounter (Signed)
Was made aware that pt had passed away 06-05-21. Called and spoke with pt's spouse [name] expressing my condolences. I have obtained a condolence card to be filled out by MR and [name] Parrett when they return back to the office. ? ? ?Routing this to both TP and MR as an Pharmacist, hospital. ?

## 2021-06-09 ENCOUNTER — Telehealth: Payer: BC Managed Care – PPO | Admitting: Internal Medicine

## 2021-06-22 DEATH — deceased

## 2022-05-17 IMAGING — CT NM PET TUM IMG INITIAL (PI) SKULL BASE T - THIGH
1 of 10 series · 1 of 25 positions shown · non-contrast
Comparison: CT chest dated 07/19/2019

CLINICAL DATA: Initial treatment strategy for left lower lobe
nodule.

EXAM:
NUCLEAR MEDICINE PET SKULL BASE TO THIGH
TECHNIQUE: 11.7 mCi F-18 FDG was injected intravenously. Full-ring PET imaging
was performed from the skull base to thigh after the radiotracer. CT
data was obtained and used for attenuation correction and anatomic
localization.
Fasting blood glucose: 116 mg/dl

[Series 3: ct wb 5.0 b30f · axial · 5.0mm · 0.98mm/px · 1 of 290 slices shown]
[im 290/290  brain]
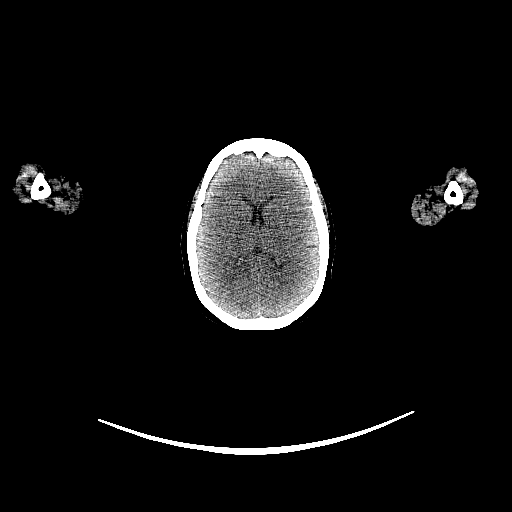

[1 of 25 positions shown; findings below may reference images not displayed]

FINDINGS: Mediastinal blood pool activity: SUV max

Liver activity: SUV max NA

NECK: No hypermetabolic cervical lymphadenopathy.

Incidental CT findings: none

CHEST: 15 mm nodule in the medial left lower lobe (series 3/image
118), max SUV 7.7, compatible primary bronchogenic neoplasm.

Moderate chronic interstitial lung disease, compatible with a UIP
pattern such as idiopathic pulmonary fibrosis.

No hypermetabolic thoracic lymphadenopathy.

Incidental CT findings: Right chest port terminates the cavoatrial
junction. Mild atherosclerotic calcifications of the aortic arch.

ABDOMEN/PELVIS: No abnormal hypermetabolism in the liver, spleen,
pancreas, or adrenal glands.

No hypermetabolic abdominopelvic lymphadenopathy.

Incidental CT findings: GDA embolization coil. SMA stent. Mild
atherosclerotic calcifications of the abdominal aorta and branch
vessels.

SKELETON: No focal hypermetabolic activity to suggest skeletal
metastasis.

Incidental CT findings: Degenerative changes of the visualized
thoracolumbar spine.
IMPRESSION: 15 mm nodule in the medial left lower lobe, compatible primary
bronchogenic neoplasm.

No findings suspicious for metastatic disease.
